# Patient Record
Sex: Male | Born: 1950 | Race: Black or African American | Hispanic: No | Smoking: Never smoker
Health system: Southern US, Community
[De-identification: ages and names within clinical notes are randomized; demographics above are authoritative.]

## PROBLEM LIST (undated history)

## (undated) DIAGNOSIS — E119 Type 2 diabetes mellitus without complications: Secondary | ICD-10-CM

## (undated) DIAGNOSIS — Z8719 Personal history of other diseases of the digestive system: Secondary | ICD-10-CM

## (undated) DIAGNOSIS — Z862 Personal history of diseases of the blood and blood-forming organs and certain disorders involving the immune mechanism: Secondary | ICD-10-CM

## (undated) DIAGNOSIS — Z860100 Personal history of colon polyps, unspecified: Secondary | ICD-10-CM

## (undated) DIAGNOSIS — F32A Depression, unspecified: Secondary | ICD-10-CM

## (undated) DIAGNOSIS — M549 Dorsalgia, unspecified: Secondary | ICD-10-CM

## (undated) DIAGNOSIS — R0789 Other chest pain: Secondary | ICD-10-CM

## (undated) DIAGNOSIS — F329 Major depressive disorder, single episode, unspecified: Secondary | ICD-10-CM

## (undated) DIAGNOSIS — I77819 Aortic ectasia, unspecified site: Secondary | ICD-10-CM

## (undated) DIAGNOSIS — M542 Cervicalgia: Secondary | ICD-10-CM

## (undated) DIAGNOSIS — G4723 Circadian rhythm sleep disorder, irregular sleep wake type: Secondary | ICD-10-CM

## (undated) DIAGNOSIS — I10 Essential (primary) hypertension: Secondary | ICD-10-CM

## (undated) DIAGNOSIS — C61 Malignant neoplasm of prostate: Secondary | ICD-10-CM

## (undated) DIAGNOSIS — K649 Unspecified hemorrhoids: Secondary | ICD-10-CM

## (undated) DIAGNOSIS — I77810 Thoracic aortic ectasia: Secondary | ICD-10-CM

## (undated) DIAGNOSIS — E785 Hyperlipidemia, unspecified: Secondary | ICD-10-CM

## (undated) DIAGNOSIS — F419 Anxiety disorder, unspecified: Secondary | ICD-10-CM

## (undated) DIAGNOSIS — G8929 Other chronic pain: Secondary | ICD-10-CM

## (undated) DIAGNOSIS — M5030 Other cervical disc degeneration, unspecified cervical region: Secondary | ICD-10-CM

## (undated) DIAGNOSIS — Z8673 Personal history of transient ischemic attack (TIA), and cerebral infarction without residual deficits: Secondary | ICD-10-CM

## (undated) DIAGNOSIS — K439 Ventral hernia without obstruction or gangrene: Secondary | ICD-10-CM

## (undated) DIAGNOSIS — G4733 Obstructive sleep apnea (adult) (pediatric): Secondary | ICD-10-CM

## (undated) DIAGNOSIS — M961 Postlaminectomy syndrome, not elsewhere classified: Secondary | ICD-10-CM

## (undated) DIAGNOSIS — F1411 Cocaine abuse, in remission: Secondary | ICD-10-CM

## (undated) DIAGNOSIS — T4145XA Adverse effect of unspecified anesthetic, initial encounter: Secondary | ICD-10-CM

## (undated) DIAGNOSIS — K222 Esophageal obstruction: Secondary | ICD-10-CM

## (undated) DIAGNOSIS — K219 Gastro-esophageal reflux disease without esophagitis: Secondary | ICD-10-CM

## (undated) DIAGNOSIS — K259 Gastric ulcer, unspecified as acute or chronic, without hemorrhage or perforation: Secondary | ICD-10-CM

## (undated) DIAGNOSIS — Z8711 Personal history of peptic ulcer disease: Secondary | ICD-10-CM

## (undated) DIAGNOSIS — G459 Transient cerebral ischemic attack, unspecified: Secondary | ICD-10-CM

## (undated) DIAGNOSIS — N3289 Other specified disorders of bladder: Secondary | ICD-10-CM

## (undated) DIAGNOSIS — K5790 Diverticulosis of intestine, part unspecified, without perforation or abscess without bleeding: Secondary | ICD-10-CM

## (undated) DIAGNOSIS — Z87438 Personal history of other diseases of male genital organs: Secondary | ICD-10-CM

## (undated) DIAGNOSIS — I700 Atherosclerosis of aorta: Secondary | ICD-10-CM

## (undated) DIAGNOSIS — Z87898 Personal history of other specified conditions: Secondary | ICD-10-CM

## (undated) DIAGNOSIS — R310 Gross hematuria: Secondary | ICD-10-CM

## (undated) HISTORY — PX: CARPAL TUNNEL RELEASE: SHX101

## (undated) HISTORY — DX: Anxiety disorder, unspecified: F41.9

## (undated) HISTORY — DX: Gastro-esophageal reflux disease without esophagitis: K21.9

## (undated) HISTORY — PX: DIRECT LARYNGOSCOPY: SHX5326

## (undated) HISTORY — DX: Obstructive sleep apnea (adult) (pediatric): G47.33

## (undated) HISTORY — DX: Esophageal obstruction: K22.2

## (undated) HISTORY — DX: Transient cerebral ischemic attack, unspecified: G45.9

## (undated) HISTORY — DX: Other specified disorders of bladder: N32.89

## (undated) HISTORY — DX: Personal history of other specified conditions: Z87.898

## (undated) HISTORY — PX: CARDIAC CATHETERIZATION: SHX172

## (undated) HISTORY — DX: Type 2 diabetes mellitus without complications: E11.9

## (undated) HISTORY — PX: TONSILLECTOMY: SUR1361

## (undated) HISTORY — PX: HEMATOMA EVACUATION: SHX5118

## (undated) HISTORY — DX: Aortic ectasia, unspecified site: I77.819

## (undated) HISTORY — PX: INGUINAL HERNIA REPAIR: SUR1180

## (undated) HISTORY — DX: Depression, unspecified: F32.A

## (undated) HISTORY — DX: Postlaminectomy syndrome, not elsewhere classified: M96.1

## (undated) HISTORY — DX: Other chronic pain: G89.29

## (undated) HISTORY — DX: Major depressive disorder, single episode, unspecified: F32.9

## (undated) HISTORY — DX: Essential (primary) hypertension: I10

## (undated) HISTORY — PX: CATARACT EXTRACTION: SUR2

## (undated) HISTORY — DX: Malignant neoplasm of prostate: C61

## (undated) HISTORY — PX: ESOPHAGOGASTRODUODENOSCOPY: SHX1529

## (undated) HISTORY — PX: TRIGGER FINGER RELEASE: SHX641

## (undated) HISTORY — DX: Cervicalgia: M54.2

## (undated) HISTORY — PX: LAPAROSCOPIC INGUINAL HERNIA REPAIR: SUR788

## (undated) HISTORY — PX: ANTERIOR CERVICAL DECOMP/DISCECTOMY FUSION: SHX1161

## (undated) HISTORY — DX: Hyperlipidemia, unspecified: E78.5

## (undated) HISTORY — DX: Thoracic aortic ectasia: I77.810

## (undated) HISTORY — DX: Personal history of other diseases of male genital organs: Z87.438

## (undated) HISTORY — DX: Diverticulosis of intestine, part unspecified, without perforation or abscess without bleeding: K57.90

## (undated) HISTORY — DX: Dorsalgia, unspecified: M54.9

## (undated) HISTORY — DX: Gross hematuria: R31.0

## (undated) HISTORY — DX: Circadian rhythm sleep disorder, irregular sleep wake type: G47.23

## (undated) HISTORY — DX: Ventral hernia without obstruction or gangrene: K43.9

---
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
## 1998-05-11 ENCOUNTER — Encounter: Payer: Self-pay | Admitting: Emergency Medicine

## 1998-05-11 ENCOUNTER — Emergency Department (HOSPITAL_COMMUNITY): Admission: EM | Admit: 1998-05-11 | Discharge: 1998-05-11 | Payer: Self-pay | Admitting: Emergency Medicine

## 1999-04-13 ENCOUNTER — Encounter: Payer: Self-pay | Admitting: Urology

## 1999-04-13 ENCOUNTER — Encounter: Admission: RE | Admit: 1999-04-13 | Discharge: 1999-04-13 | Payer: Self-pay | Admitting: Oncology

## 1999-05-11 ENCOUNTER — Encounter: Payer: Self-pay | Admitting: Urology

## 1999-05-12 ENCOUNTER — Ambulatory Visit (HOSPITAL_COMMUNITY): Admission: RE | Admit: 1999-05-12 | Discharge: 1999-05-12 | Payer: Self-pay | Admitting: Urology

## 1999-05-12 ENCOUNTER — Encounter (INDEPENDENT_AMBULATORY_CARE_PROVIDER_SITE_OTHER): Payer: Self-pay | Admitting: Specialist

## 1999-05-22 ENCOUNTER — Encounter: Payer: Self-pay | Admitting: Urology

## 1999-05-22 ENCOUNTER — Encounter: Admission: RE | Admit: 1999-05-22 | Discharge: 1999-05-22 | Payer: Self-pay | Admitting: Urology

## 2000-06-18 ENCOUNTER — Encounter: Admission: RE | Admit: 2000-06-18 | Discharge: 2000-06-18 | Payer: Self-pay | Admitting: Urology

## 2000-06-18 ENCOUNTER — Encounter: Payer: Self-pay | Admitting: Urology

## 2001-08-20 ENCOUNTER — Ambulatory Visit (HOSPITAL_COMMUNITY): Admission: RE | Admit: 2001-08-20 | Discharge: 2001-08-20 | Payer: Self-pay | Admitting: Internal Medicine

## 2001-08-20 ENCOUNTER — Encounter: Payer: Self-pay | Admitting: Internal Medicine

## 2002-04-23 DIAGNOSIS — F1491 Cocaine use, unspecified, in remission: Secondary | ICD-10-CM

## 2002-04-23 HISTORY — DX: Cocaine use, unspecified, in remission: F14.91

## 2002-08-22 ENCOUNTER — Inpatient Hospital Stay (HOSPITAL_COMMUNITY): Admission: EM | Admit: 2002-08-22 | Discharge: 2002-08-25 | Payer: Self-pay | Admitting: Emergency Medicine

## 2002-08-22 ENCOUNTER — Encounter: Payer: Self-pay | Admitting: Emergency Medicine

## 2002-09-02 ENCOUNTER — Encounter: Payer: Self-pay | Admitting: Emergency Medicine

## 2002-09-02 ENCOUNTER — Emergency Department (HOSPITAL_COMMUNITY): Admission: EM | Admit: 2002-09-02 | Discharge: 2002-09-02 | Payer: Self-pay

## 2002-09-17 ENCOUNTER — Ambulatory Visit (HOSPITAL_COMMUNITY): Admission: RE | Admit: 2002-09-17 | Discharge: 2002-09-17 | Payer: Self-pay | Admitting: Internal Medicine

## 2002-09-17 ENCOUNTER — Encounter: Payer: Self-pay | Admitting: Internal Medicine

## 2002-11-20 ENCOUNTER — Encounter: Payer: Self-pay | Admitting: Emergency Medicine

## 2002-11-20 ENCOUNTER — Emergency Department (HOSPITAL_COMMUNITY): Admission: EM | Admit: 2002-11-20 | Discharge: 2002-11-20 | Payer: Self-pay | Admitting: Emergency Medicine

## 2003-11-03 ENCOUNTER — Emergency Department (HOSPITAL_COMMUNITY): Admission: EM | Admit: 2003-11-03 | Discharge: 2003-11-03 | Payer: Self-pay | Admitting: *Deleted

## 2003-12-16 ENCOUNTER — Emergency Department (HOSPITAL_COMMUNITY): Admission: EM | Admit: 2003-12-16 | Discharge: 2003-12-16 | Payer: Self-pay | Admitting: Emergency Medicine

## 2004-08-16 ENCOUNTER — Ambulatory Visit: Payer: Self-pay | Admitting: Family Medicine

## 2005-07-24 ENCOUNTER — Ambulatory Visit: Payer: Self-pay | Admitting: Family Medicine

## 2005-07-25 ENCOUNTER — Ambulatory Visit: Payer: Self-pay | Admitting: Gastroenterology

## 2005-08-20 ENCOUNTER — Ambulatory Visit: Payer: Self-pay | Admitting: Family Medicine

## 2005-08-21 ENCOUNTER — Encounter: Payer: Self-pay | Admitting: Family Medicine

## 2005-08-21 DIAGNOSIS — E7800 Pure hypercholesterolemia, unspecified: Secondary | ICD-10-CM

## 2005-08-21 LAB — CONVERTED CEMR LAB: Hgb A1c MFr Bld: 8 %

## 2005-08-23 ENCOUNTER — Ambulatory Visit: Payer: Self-pay | Admitting: Family Medicine

## 2005-09-24 ENCOUNTER — Ambulatory Visit: Payer: Self-pay | Admitting: Family Medicine

## 2005-09-25 ENCOUNTER — Ambulatory Visit: Payer: Self-pay | Admitting: Gastroenterology

## 2005-11-21 ENCOUNTER — Encounter: Payer: Self-pay | Admitting: Family Medicine

## 2005-11-21 LAB — CONVERTED CEMR LAB: Hgb A1c MFr Bld: 6.1 %

## 2005-12-12 ENCOUNTER — Ambulatory Visit (HOSPITAL_BASED_OUTPATIENT_CLINIC_OR_DEPARTMENT_OTHER): Admission: RE | Admit: 2005-12-12 | Discharge: 2005-12-12 | Payer: Self-pay | Admitting: *Deleted

## 2005-12-20 ENCOUNTER — Ambulatory Visit: Payer: Self-pay | Admitting: Family Medicine

## 2005-12-25 ENCOUNTER — Ambulatory Visit: Payer: Self-pay | Admitting: Family Medicine

## 2006-03-27 ENCOUNTER — Ambulatory Visit: Payer: Self-pay | Admitting: Family Medicine

## 2006-04-09 ENCOUNTER — Ambulatory Visit: Payer: Self-pay | Admitting: Cardiology

## 2006-04-23 HISTORY — PX: CARDIAC CATHETERIZATION: SHX172

## 2006-04-25 ENCOUNTER — Ambulatory Visit: Payer: Self-pay

## 2006-06-26 ENCOUNTER — Ambulatory Visit: Payer: Self-pay | Admitting: Family Medicine

## 2006-10-01 ENCOUNTER — Ambulatory Visit: Payer: Self-pay | Admitting: Gastroenterology

## 2006-10-01 ENCOUNTER — Ambulatory Visit: Payer: Self-pay | Admitting: Family Medicine

## 2006-10-01 ENCOUNTER — Encounter: Payer: Self-pay | Admitting: Family Medicine

## 2006-10-01 DIAGNOSIS — E1165 Type 2 diabetes mellitus with hyperglycemia: Secondary | ICD-10-CM

## 2006-10-01 DIAGNOSIS — E118 Type 2 diabetes mellitus with unspecified complications: Secondary | ICD-10-CM

## 2006-10-01 DIAGNOSIS — Z9189 Other specified personal risk factors, not elsewhere classified: Secondary | ICD-10-CM

## 2006-10-01 LAB — CONVERTED CEMR LAB
Albumin: 3.6 g/dL (ref 3.5–5.2)
Alkaline Phosphatase: 63 units/L (ref 39–117)
BUN: 12 mg/dL (ref 6–23)
Basophils Absolute: 0 10*3/uL (ref 0.0–0.1)
Chloride: 101 meq/L (ref 96–112)
Creatinine, Ser: 0.8 mg/dL (ref 0.4–1.5)
Ferritin: 86.5 ng/mL (ref 22.0–322.0)
MCHC: 35.1 g/dL (ref 30.0–36.0)
Monocytes Relative: 11 % (ref 3.0–11.0)
Potassium: 3.9 meq/L (ref 3.5–5.1)
RBC: 4.44 M/uL (ref 4.22–5.81)
RDW: 13.8 % (ref 11.5–14.6)
TSH: 1.38 microintl units/mL (ref 0.35–5.50)
Total Bilirubin: 0.5 mg/dL (ref 0.3–1.2)
Transferrin: 249.2 mg/dL (ref >=212.0)

## 2006-10-02 ENCOUNTER — Ambulatory Visit: Payer: Self-pay | Admitting: Cardiology

## 2006-10-02 LAB — CONVERTED CEMR LAB
ALT: 27 units/L (ref 0–40)
Creatinine,U: 166.4 mg/dL
Hgb A1c MFr Bld: 7.3 % — ABNORMAL HIGH (ref 4.6–6.0)
Microalb Creat Ratio: 13.2 mg/g (ref 0.0–30.0)
Microalb, Ur: 2.2 mg/dL — ABNORMAL HIGH (ref 0.0–1.9)

## 2006-10-03 ENCOUNTER — Ambulatory Visit: Payer: Self-pay | Admitting: Family Medicine

## 2006-10-03 DIAGNOSIS — K439 Ventral hernia without obstruction or gangrene: Secondary | ICD-10-CM | POA: Insufficient documentation

## 2006-10-03 DIAGNOSIS — G4723 Circadian rhythm sleep disorder, irregular sleep wake type: Secondary | ICD-10-CM | POA: Insufficient documentation

## 2006-10-17 ENCOUNTER — Ambulatory Visit: Payer: Self-pay | Admitting: Gastroenterology

## 2006-11-04 ENCOUNTER — Encounter: Payer: Self-pay | Admitting: Family Medicine

## 2006-11-11 ENCOUNTER — Ambulatory Visit: Payer: Self-pay | Admitting: Family Medicine

## 2006-11-22 ENCOUNTER — Ambulatory Visit (HOSPITAL_COMMUNITY): Admission: RE | Admit: 2006-11-22 | Discharge: 2006-11-22 | Payer: Self-pay | Admitting: Surgery

## 2006-11-22 ENCOUNTER — Encounter (INDEPENDENT_AMBULATORY_CARE_PROVIDER_SITE_OTHER): Payer: Self-pay | Admitting: Surgery

## 2006-11-22 ENCOUNTER — Encounter: Payer: Self-pay | Admitting: Family Medicine

## 2006-12-31 ENCOUNTER — Ambulatory Visit: Payer: Self-pay | Admitting: Family Medicine

## 2006-12-31 DIAGNOSIS — M5410 Radiculopathy, site unspecified: Secondary | ICD-10-CM | POA: Insufficient documentation

## 2006-12-31 DIAGNOSIS — N453 Epididymo-orchitis: Secondary | ICD-10-CM | POA: Insufficient documentation

## 2007-01-13 ENCOUNTER — Telehealth: Payer: Self-pay | Admitting: Family Medicine

## 2007-02-10 ENCOUNTER — Encounter (INDEPENDENT_AMBULATORY_CARE_PROVIDER_SITE_OTHER): Payer: Self-pay | Admitting: *Deleted

## 2007-02-12 ENCOUNTER — Telehealth (INDEPENDENT_AMBULATORY_CARE_PROVIDER_SITE_OTHER): Payer: Self-pay | Admitting: *Deleted

## 2007-02-12 ENCOUNTER — Ambulatory Visit: Payer: Self-pay | Admitting: Family Medicine

## 2007-02-13 LAB — CONVERTED CEMR LAB: Hgb A1c MFr Bld: 7 % — ABNORMAL HIGH (ref 4.6–6.0)

## 2007-03-05 ENCOUNTER — Ambulatory Visit: Payer: Self-pay | Admitting: Family Medicine

## 2007-03-05 ENCOUNTER — Telehealth: Payer: Self-pay | Admitting: Family Medicine

## 2007-03-05 DIAGNOSIS — J069 Acute upper respiratory infection, unspecified: Secondary | ICD-10-CM | POA: Insufficient documentation

## 2007-03-05 DIAGNOSIS — G459 Transient cerebral ischemic attack, unspecified: Secondary | ICD-10-CM

## 2007-03-14 ENCOUNTER — Emergency Department (HOSPITAL_COMMUNITY): Admission: EM | Admit: 2007-03-14 | Discharge: 2007-03-14 | Payer: Self-pay | Admitting: Emergency Medicine

## 2007-03-14 ENCOUNTER — Encounter: Payer: Self-pay | Admitting: Family Medicine

## 2007-03-14 ENCOUNTER — Telehealth (INDEPENDENT_AMBULATORY_CARE_PROVIDER_SITE_OTHER): Payer: Self-pay | Admitting: Internal Medicine

## 2007-03-14 ENCOUNTER — Telehealth: Payer: Self-pay | Admitting: Family Medicine

## 2007-03-17 ENCOUNTER — Ambulatory Visit: Payer: Self-pay

## 2007-03-17 ENCOUNTER — Encounter: Payer: Self-pay | Admitting: Family Medicine

## 2007-03-19 ENCOUNTER — Ambulatory Visit: Payer: Self-pay | Admitting: Family Medicine

## 2007-03-19 DIAGNOSIS — F341 Dysthymic disorder: Secondary | ICD-10-CM | POA: Insufficient documentation

## 2007-03-21 ENCOUNTER — Ambulatory Visit: Payer: Self-pay | Admitting: Cardiovascular Disease

## 2007-04-03 ENCOUNTER — Ambulatory Visit: Payer: Self-pay | Admitting: Family Medicine

## 2007-04-21 ENCOUNTER — Ambulatory Visit: Payer: Self-pay | Admitting: Family Medicine

## 2007-04-21 DIAGNOSIS — M62838 Other muscle spasm: Secondary | ICD-10-CM | POA: Insufficient documentation

## 2007-05-27 ENCOUNTER — Telehealth: Payer: Self-pay | Admitting: Family Medicine

## 2007-05-27 ENCOUNTER — Ambulatory Visit: Payer: Self-pay | Admitting: Family Medicine

## 2007-05-28 ENCOUNTER — Ambulatory Visit: Payer: Self-pay | Admitting: Family Medicine

## 2007-05-30 ENCOUNTER — Telehealth (INDEPENDENT_AMBULATORY_CARE_PROVIDER_SITE_OTHER): Payer: Self-pay | Admitting: *Deleted

## 2007-06-02 ENCOUNTER — Telehealth: Payer: Self-pay | Admitting: Family Medicine

## 2007-06-19 ENCOUNTER — Ambulatory Visit: Payer: Self-pay | Admitting: Family Medicine

## 2007-07-01 ENCOUNTER — Ambulatory Visit: Payer: Self-pay | Admitting: Cardiovascular Disease

## 2007-07-08 ENCOUNTER — Telehealth: Payer: Self-pay | Admitting: Family Medicine

## 2007-07-15 ENCOUNTER — Ambulatory Visit: Payer: Self-pay | Admitting: Internal Medicine

## 2007-07-17 ENCOUNTER — Encounter: Payer: Self-pay | Admitting: Family Medicine

## 2007-07-17 ENCOUNTER — Ambulatory Visit: Payer: Self-pay | Admitting: Internal Medicine

## 2007-07-17 HISTORY — PX: ESOPHAGOGASTRODUODENOSCOPY: SHX1529

## 2007-07-22 ENCOUNTER — Telehealth: Payer: Self-pay | Admitting: Family Medicine

## 2007-07-25 ENCOUNTER — Ambulatory Visit: Payer: Self-pay | Admitting: Family Medicine

## 2007-07-25 DIAGNOSIS — M79609 Pain in unspecified limb: Secondary | ICD-10-CM

## 2007-08-14 ENCOUNTER — Telehealth: Payer: Self-pay | Admitting: Family Medicine

## 2007-08-15 ENCOUNTER — Ambulatory Visit: Payer: Self-pay | Admitting: Family Medicine

## 2007-08-21 ENCOUNTER — Ambulatory Visit: Payer: Self-pay | Admitting: Family Medicine

## 2007-09-22 ENCOUNTER — Ambulatory Visit: Payer: Self-pay | Admitting: Family Medicine

## 2007-09-22 DIAGNOSIS — R0602 Shortness of breath: Secondary | ICD-10-CM

## 2007-10-09 ENCOUNTER — Telehealth: Payer: Self-pay | Admitting: Family Medicine

## 2007-11-02 ENCOUNTER — Emergency Department (HOSPITAL_COMMUNITY): Admission: EM | Admit: 2007-11-02 | Discharge: 2007-11-02 | Payer: Self-pay | Admitting: Emergency Medicine

## 2007-12-18 ENCOUNTER — Ambulatory Visit: Payer: Self-pay | Admitting: Cardiovascular Disease

## 2007-12-24 ENCOUNTER — Encounter (INDEPENDENT_AMBULATORY_CARE_PROVIDER_SITE_OTHER): Payer: Self-pay | Admitting: *Deleted

## 2007-12-26 ENCOUNTER — Ambulatory Visit: Payer: Self-pay | Admitting: Cardiovascular Disease

## 2007-12-26 LAB — CONVERTED CEMR LAB
Basophils Absolute: 0.1 10*3/uL (ref 0.0–0.1)
Calcium: 8.8 mg/dL (ref 8.4–10.5)
Eosinophils Absolute: 0.3 10*3/uL (ref 0.0–0.7)
GFR calc Af Amer: 112 mL/min
GFR calc non Af Amer: 92 mL/min
HCT: 40.3 % (ref 39.0–52.0)
INR: 1 (ref 0.8–1.0)
MCHC: 34.3 g/dL (ref 30.0–36.0)
MCV: 93.1 fL (ref 78.0–100.0)
Monocytes Absolute: 0.7 10*3/uL (ref 0.1–1.0)
Monocytes Relative: 10.4 % (ref 3.0–12.0)
Platelets: 281 10*3/uL (ref 150–400)
Potassium: 3.7 meq/L (ref 3.5–5.1)
RDW: 13.8 % (ref 11.5–14.6)
Sodium: 141 meq/L (ref 135–145)
aPTT: 33.2 s — ABNORMAL HIGH (ref 21.7–29.8)

## 2007-12-31 ENCOUNTER — Inpatient Hospital Stay (HOSPITAL_BASED_OUTPATIENT_CLINIC_OR_DEPARTMENT_OTHER): Admission: RE | Admit: 2007-12-31 | Discharge: 2007-12-31 | Payer: Self-pay | Admitting: Cardiovascular Disease

## 2007-12-31 ENCOUNTER — Ambulatory Visit: Payer: Self-pay | Admitting: Cardiovascular Disease

## 2008-01-01 ENCOUNTER — Ambulatory Visit: Payer: Self-pay | Admitting: Family Medicine

## 2008-01-01 DIAGNOSIS — I10 Essential (primary) hypertension: Secondary | ICD-10-CM

## 2008-01-01 HISTORY — DX: Essential (primary) hypertension: I10

## 2008-01-05 ENCOUNTER — Ambulatory Visit: Payer: Self-pay | Admitting: Family Medicine

## 2008-01-07 ENCOUNTER — Ambulatory Visit: Payer: Self-pay | Admitting: Cardiovascular Disease

## 2008-01-13 ENCOUNTER — Telehealth: Payer: Self-pay | Admitting: Family Medicine

## 2008-01-22 ENCOUNTER — Encounter: Payer: Self-pay | Admitting: Family Medicine

## 2008-02-19 ENCOUNTER — Ambulatory Visit: Payer: Self-pay | Admitting: Family Medicine

## 2008-04-19 ENCOUNTER — Telehealth: Payer: Self-pay | Admitting: Family Medicine

## 2008-04-20 ENCOUNTER — Ambulatory Visit: Payer: Self-pay | Admitting: Cardiovascular Disease

## 2008-04-20 LAB — CONVERTED CEMR LAB
Calcium: 9.1 mg/dL (ref 8.4–10.5)
GFR calc Af Amer: 88 mL/min
GFR calc non Af Amer: 73 mL/min
Potassium: 3.8 meq/L (ref 3.5–5.1)
Sodium: 139 meq/L (ref 135–145)

## 2008-04-22 ENCOUNTER — Ambulatory Visit: Payer: Self-pay | Admitting: Internal Medicine

## 2008-04-23 DIAGNOSIS — G4733 Obstructive sleep apnea (adult) (pediatric): Secondary | ICD-10-CM

## 2008-04-23 HISTORY — DX: Obstructive sleep apnea (adult) (pediatric): G47.33

## 2008-04-25 ENCOUNTER — Encounter: Payer: Self-pay | Admitting: Cardiovascular Disease

## 2008-04-25 ENCOUNTER — Ambulatory Visit (HOSPITAL_COMMUNITY): Admission: RE | Admit: 2008-04-25 | Discharge: 2008-04-25 | Payer: Self-pay | Admitting: Cardiovascular Disease

## 2008-04-26 ENCOUNTER — Ambulatory Visit: Payer: Self-pay | Admitting: Family Medicine

## 2008-04-26 DIAGNOSIS — R079 Chest pain, unspecified: Secondary | ICD-10-CM | POA: Insufficient documentation

## 2008-04-26 DIAGNOSIS — M6520 Calcific tendinitis, unspecified site: Secondary | ICD-10-CM

## 2008-05-04 ENCOUNTER — Ambulatory Visit: Payer: Self-pay | Admitting: Gastroenterology

## 2008-05-04 ENCOUNTER — Telehealth: Payer: Self-pay | Admitting: Family Medicine

## 2008-05-06 ENCOUNTER — Ambulatory Visit: Payer: Self-pay | Admitting: Emergency Medicine

## 2008-05-07 ENCOUNTER — Encounter: Payer: Self-pay | Admitting: Emergency Medicine

## 2008-05-07 ENCOUNTER — Ambulatory Visit (HOSPITAL_BASED_OUTPATIENT_CLINIC_OR_DEPARTMENT_OTHER): Admission: RE | Admit: 2008-05-07 | Discharge: 2008-05-07 | Payer: Self-pay | Admitting: Emergency Medicine

## 2008-05-08 ENCOUNTER — Ambulatory Visit: Payer: Self-pay | Admitting: Pulmonary Disease

## 2008-05-12 ENCOUNTER — Ambulatory Visit: Payer: Self-pay | Admitting: Family Medicine

## 2008-06-04 ENCOUNTER — Telehealth: Payer: Self-pay | Admitting: Family Medicine

## 2008-06-09 ENCOUNTER — Encounter: Payer: Self-pay | Admitting: Family Medicine

## 2008-06-23 ENCOUNTER — Telehealth: Payer: Self-pay | Admitting: Family Medicine

## 2008-06-24 ENCOUNTER — Inpatient Hospital Stay (HOSPITAL_COMMUNITY): Admission: RE | Admit: 2008-06-24 | Discharge: 2008-06-25 | Payer: Self-pay | Admitting: Orthopedic Surgery

## 2008-07-21 ENCOUNTER — Ambulatory Visit: Payer: Self-pay | Admitting: Family Medicine

## 2008-08-04 ENCOUNTER — Ambulatory Visit: Payer: Self-pay | Admitting: Family Medicine

## 2008-08-09 ENCOUNTER — Telehealth: Payer: Self-pay | Admitting: Family Medicine

## 2008-08-19 ENCOUNTER — Ambulatory Visit: Payer: Self-pay | Admitting: Family Medicine

## 2008-09-24 ENCOUNTER — Encounter: Admission: RE | Admit: 2008-09-24 | Discharge: 2008-09-24 | Payer: Self-pay | Admitting: Orthopedic Surgery

## 2008-09-26 ENCOUNTER — Telehealth: Payer: Self-pay | Admitting: Emergency Medicine

## 2008-09-29 DIAGNOSIS — K219 Gastro-esophageal reflux disease without esophagitis: Secondary | ICD-10-CM

## 2008-09-29 DIAGNOSIS — J329 Chronic sinusitis, unspecified: Secondary | ICD-10-CM | POA: Insufficient documentation

## 2008-09-30 ENCOUNTER — Ambulatory Visit: Payer: Self-pay | Admitting: Cardiovascular Disease

## 2008-09-30 ENCOUNTER — Ambulatory Visit: Payer: Self-pay | Admitting: Family Medicine

## 2008-10-18 ENCOUNTER — Ambulatory Visit: Payer: Self-pay | Admitting: Family Medicine

## 2008-10-18 DIAGNOSIS — M940 Chondrocostal junction syndrome [Tietze]: Secondary | ICD-10-CM

## 2008-10-19 ENCOUNTER — Encounter: Payer: Self-pay | Admitting: Family Medicine

## 2008-10-28 ENCOUNTER — Inpatient Hospital Stay (HOSPITAL_COMMUNITY): Admission: RE | Admit: 2008-10-28 | Discharge: 2008-11-05 | Payer: Self-pay | Admitting: Orthopedic Surgery

## 2008-10-28 ENCOUNTER — Encounter: Payer: Self-pay | Admitting: Cardiovascular Disease

## 2008-10-28 ENCOUNTER — Encounter: Payer: Self-pay | Admitting: Family Medicine

## 2008-10-28 ENCOUNTER — Ambulatory Visit: Payer: Self-pay | Admitting: Internal Medicine

## 2008-10-28 HISTORY — PX: ANTERIOR CERVICAL DISCECTOMY: SHX1160

## 2008-10-29 HISTORY — PX: HEMATOMA EVACUATION: SHX5118

## 2008-11-01 ENCOUNTER — Encounter: Payer: Self-pay | Admitting: Family Medicine

## 2008-11-02 ENCOUNTER — Encounter: Payer: Self-pay | Admitting: Family Medicine

## 2008-11-02 HISTORY — PX: DIRECT LARYNGOSCOPY: SHX5326

## 2008-11-05 ENCOUNTER — Encounter: Payer: Self-pay | Admitting: Family Medicine

## 2008-11-16 ENCOUNTER — Ambulatory Visit: Payer: Self-pay | Admitting: Family Medicine

## 2008-11-30 ENCOUNTER — Ambulatory Visit: Payer: Self-pay | Admitting: Family Medicine

## 2008-12-08 ENCOUNTER — Encounter: Admission: RE | Admit: 2008-12-08 | Discharge: 2008-12-28 | Payer: Self-pay | Admitting: Orthopedic Surgery

## 2008-12-30 ENCOUNTER — Telehealth: Payer: Self-pay | Admitting: Family Medicine

## 2009-01-27 ENCOUNTER — Ambulatory Visit: Payer: Self-pay | Admitting: Family Medicine

## 2009-05-17 ENCOUNTER — Telehealth: Payer: Self-pay | Admitting: Family Medicine

## 2009-06-13 ENCOUNTER — Emergency Department (HOSPITAL_COMMUNITY): Admission: EM | Admit: 2009-06-13 | Discharge: 2009-06-13 | Payer: Self-pay | Admitting: Emergency Medicine

## 2009-06-15 ENCOUNTER — Ambulatory Visit: Payer: Self-pay | Admitting: Family Medicine

## 2009-06-17 ENCOUNTER — Ambulatory Visit: Payer: Self-pay | Admitting: Gastroenterology

## 2009-06-23 LAB — CONVERTED CEMR LAB
ALT: 18 units/L (ref 0–53)
AST: 16 units/L (ref 0–37)
Amylase: 124 units/L (ref 27–131)
Basophils Absolute: 0.1 10*3/uL (ref 0.0–0.1)
Basophils Relative: 1.2 % (ref 0.0–3.0)
Bilirubin, Direct: 0.1 mg/dL (ref 0.0–0.3)
CO2: 28 meq/L (ref 19–32)
Chloride: 107 meq/L (ref 96–112)
Creatinine, Ser: 0.9 mg/dL (ref 0.4–1.5)
Eosinophils Absolute: 0.2 10*3/uL (ref 0.0–0.7)
Hemoglobin: 13.1 g/dL (ref 13.0–17.0)
Iron: 60 ug/dL (ref 42–165)
Lipase: 31 units/L (ref 11.0–59.0)
Lymphocytes Relative: 27.5 % (ref 12.0–46.0)
Monocytes Relative: 9.7 % (ref 3.0–12.0)
Neutro Abs: 2.7 10*3/uL (ref 1.4–7.7)
Neutrophils Relative %: 58 % (ref 43.0–77.0)
Potassium: 4.1 meq/L (ref 3.5–5.1)
RBC: 4.36 M/uL (ref 4.22–5.81)
Saturation Ratios: 17.5 % — ABNORMAL LOW (ref 20.0–50.0)
Sed Rate: 24 mm/hr — ABNORMAL HIGH (ref 0–22)
Total Bilirubin: 0.3 mg/dL (ref 0.3–1.2)
Total Protein: 7.5 g/dL (ref 6.0–8.3)
Transferrin: 245.5 mg/dL (ref 212.0–360.0)
Vitamin B-12: 436 pg/mL (ref 211–911)

## 2009-06-29 ENCOUNTER — Ambulatory Visit: Payer: Self-pay | Admitting: Cardiovascular Disease

## 2009-07-01 ENCOUNTER — Ambulatory Visit: Payer: Self-pay | Admitting: Gastroenterology

## 2009-07-01 ENCOUNTER — Encounter (INDEPENDENT_AMBULATORY_CARE_PROVIDER_SITE_OTHER): Payer: Self-pay | Admitting: *Deleted

## 2009-07-01 DIAGNOSIS — D509 Iron deficiency anemia, unspecified: Secondary | ICD-10-CM

## 2009-07-25 ENCOUNTER — Ambulatory Visit: Payer: Self-pay | Admitting: Gastroenterology

## 2009-08-31 ENCOUNTER — Ambulatory Visit: Payer: Self-pay | Admitting: Family Medicine

## 2009-09-30 ENCOUNTER — Encounter: Payer: Self-pay | Admitting: Family Medicine

## 2009-10-28 ENCOUNTER — Ambulatory Visit: Payer: Self-pay | Admitting: Gastroenterology

## 2009-10-28 ENCOUNTER — Encounter (INDEPENDENT_AMBULATORY_CARE_PROVIDER_SITE_OTHER): Payer: Self-pay | Admitting: *Deleted

## 2009-10-28 DIAGNOSIS — R1013 Epigastric pain: Secondary | ICD-10-CM

## 2009-10-28 LAB — CONVERTED CEMR LAB
Albumin: 4 g/dL (ref 3.5–5.2)
Alkaline Phosphatase: 56 units/L (ref 39–117)
Basophils Absolute: 0.1 10*3/uL (ref 0.0–0.1)
Calcium: 9.3 mg/dL (ref 8.4–10.5)
Eosinophils Absolute: 0.1 10*3/uL (ref 0.0–0.7)
Eosinophils Relative: 1.6 % (ref 0.0–5.0)
Ferritin: 49.8 ng/mL (ref 22.0–322.0)
Folate: >20 ng/mL
GFR calc non Af Amer: 109.41 mL/min (ref >60)
Glucose, Bld: 146 mg/dL — ABNORMAL HIGH (ref 70–99)
H Pylori IgG: NEGATIVE
HCT: 42.3 % (ref 39.0–52.0)
Iron: 89 ug/dL (ref 42–165)
Lymphs Abs: 1.6 10*3/uL (ref 0.7–4.0)
MCHC: 33.7 g/dL (ref 30.0–36.0)
MCV: 91.9 fL (ref 78.0–100.0)
Monocytes Absolute: 0.7 10*3/uL (ref 0.1–1.0)
Neutrophils Relative %: 62.9 % (ref 43.0–77.0)
Platelets: 275 10*3/uL (ref 150.0–400.0)
Potassium: 4.6 meq/L (ref 3.5–5.1)
RDW: 15.5 % — ABNORMAL HIGH (ref 11.5–14.6)
Saturation Ratios: 21.8 % (ref 20.0–50.0)
Sodium: 142 meq/L (ref 135–145)
Total Bilirubin: 0.6 mg/dL (ref 0.3–1.2)

## 2009-11-01 ENCOUNTER — Ambulatory Visit (HOSPITAL_COMMUNITY): Admission: RE | Admit: 2009-11-01 | Discharge: 2009-11-01 | Payer: Self-pay | Admitting: Internal Medicine

## 2009-11-09 ENCOUNTER — Telehealth (INDEPENDENT_AMBULATORY_CARE_PROVIDER_SITE_OTHER): Payer: Self-pay | Admitting: *Deleted

## 2009-11-09 ENCOUNTER — Ambulatory Visit: Payer: Self-pay | Admitting: Gastroenterology

## 2009-11-09 LAB — CONVERTED CEMR LAB: UREASE: NEGATIVE

## 2009-11-11 ENCOUNTER — Encounter: Payer: Self-pay | Admitting: Gastroenterology

## 2009-11-16 ENCOUNTER — Telehealth: Payer: Self-pay | Admitting: Gastroenterology

## 2009-11-18 ENCOUNTER — Encounter: Payer: Self-pay | Admitting: Gastroenterology

## 2009-11-29 ENCOUNTER — Encounter (INDEPENDENT_AMBULATORY_CARE_PROVIDER_SITE_OTHER): Payer: Self-pay | Admitting: *Deleted

## 2009-11-30 ENCOUNTER — Encounter (INDEPENDENT_AMBULATORY_CARE_PROVIDER_SITE_OTHER): Payer: Self-pay | Admitting: *Deleted

## 2009-12-02 ENCOUNTER — Ambulatory Visit: Payer: Self-pay | Admitting: Family Medicine

## 2009-12-03 LAB — CONVERTED CEMR LAB
BUN: 12 mg/dL (ref 6–23)
CO2: 30 meq/L (ref 19–32)
Chloride: 103 meq/L (ref 96–112)
Hgb A1c MFr Bld: 8.5 % — ABNORMAL HIGH (ref 4.6–6.5)
Potassium: 3.8 meq/L (ref 3.5–5.1)

## 2009-12-05 ENCOUNTER — Ambulatory Visit: Payer: Self-pay | Admitting: Family Medicine

## 2009-12-12 ENCOUNTER — Telehealth: Payer: Self-pay | Admitting: Gastroenterology

## 2010-01-12 ENCOUNTER — Ambulatory Visit: Payer: Self-pay | Admitting: Family Medicine

## 2010-01-12 DIAGNOSIS — D179 Benign lipomatous neoplasm, unspecified: Secondary | ICD-10-CM | POA: Insufficient documentation

## 2010-01-13 ENCOUNTER — Telehealth: Payer: Self-pay | Admitting: Gastroenterology

## 2010-01-25 ENCOUNTER — Ambulatory Visit: Payer: Self-pay | Admitting: Family Medicine

## 2010-02-09 ENCOUNTER — Encounter: Payer: Self-pay | Admitting: Family Medicine

## 2010-02-16 ENCOUNTER — Ambulatory Visit: Payer: Self-pay | Admitting: Family Medicine

## 2010-02-17 LAB — CONVERTED CEMR LAB
Albumin: 3.7 g/dL (ref 3.5–5.2)
Alkaline Phosphatase: 55 units/L (ref 39–117)
Basophils Relative: 0.8 % (ref 0.0–3.0)
CO2: 30 meq/L (ref 19–32)
Chloride: 95 meq/L — ABNORMAL LOW (ref 96–112)
Eosinophils Absolute: 0.1 10*3/uL (ref 0.0–0.7)
Glucose, Bld: 85 mg/dL (ref 70–99)
HCT: 38.4 % — ABNORMAL LOW (ref 39.0–52.0)
Hemoglobin: 13 g/dL (ref 13.0–17.0)
Lymphs Abs: 2.2 10*3/uL (ref 0.7–4.0)
MCHC: 33.9 g/dL (ref 30.0–36.0)
MCV: 94 fL (ref 78.0–100.0)
Monocytes Absolute: 0.7 10*3/uL (ref 0.1–1.0)
Neutro Abs: 4.5 10*3/uL (ref 1.4–7.7)
RBC: 4.09 M/uL — ABNORMAL LOW (ref 4.22–5.81)
Sodium: 133 meq/L — ABNORMAL LOW (ref 135–145)
Total Protein: 6.8 g/dL (ref 6.0–8.3)

## 2010-02-28 ENCOUNTER — Ambulatory Visit: Payer: Self-pay | Admitting: Family Medicine

## 2010-02-28 ENCOUNTER — Telehealth: Payer: Self-pay | Admitting: Gastroenterology

## 2010-02-28 LAB — CONVERTED CEMR LAB
Bilirubin Urine: NEGATIVE
Ketones, urine, test strip: NEGATIVE
Nitrite: NEGATIVE
Specific Gravity, Urine: 1.01
Urobilinogen, UA: 0.2
pH: 6

## 2010-03-01 ENCOUNTER — Telehealth: Payer: Self-pay | Admitting: Family Medicine

## 2010-03-02 ENCOUNTER — Ambulatory Visit: Payer: Self-pay | Admitting: Family Medicine

## 2010-03-02 LAB — CONVERTED CEMR LAB
Creatinine,U: 136.5 mg/dL
Direct LDL: 162.4 mg/dL
Hgb A1c MFr Bld: 7.7 % — ABNORMAL HIGH (ref 4.6–6.5)
Microalb Creat Ratio: 2.6 mg/g (ref 0.0–30.0)
Total CHOL/HDL Ratio: 5
Triglycerides: 105 mg/dL (ref 0.0–149.0)

## 2010-03-09 ENCOUNTER — Ambulatory Visit: Payer: Self-pay | Admitting: Family Medicine

## 2010-03-09 DIAGNOSIS — M25559 Pain in unspecified hip: Secondary | ICD-10-CM | POA: Insufficient documentation

## 2010-03-22 ENCOUNTER — Telehealth (INDEPENDENT_AMBULATORY_CARE_PROVIDER_SITE_OTHER): Payer: Self-pay | Admitting: *Deleted

## 2010-03-22 ENCOUNTER — Ambulatory Visit: Payer: Self-pay | Admitting: Cardiovascular Disease

## 2010-04-06 ENCOUNTER — Telehealth: Payer: Self-pay | Admitting: Family Medicine

## 2010-04-18 ENCOUNTER — Telehealth: Payer: Self-pay | Admitting: Gastroenterology

## 2010-05-04 ENCOUNTER — Telehealth: Payer: Self-pay | Admitting: Family Medicine

## 2010-05-04 ENCOUNTER — Ambulatory Visit
Admission: RE | Admit: 2010-05-04 | Discharge: 2010-05-04 | Payer: Self-pay | Source: Home / Self Care | Attending: Family Medicine | Admitting: Family Medicine

## 2010-05-04 DIAGNOSIS — R21 Rash and other nonspecific skin eruption: Secondary | ICD-10-CM | POA: Insufficient documentation

## 2010-05-04 LAB — HM DIABETES FOOT EXAM

## 2010-05-05 ENCOUNTER — Encounter: Payer: Self-pay | Admitting: Family Medicine

## 2010-05-14 ENCOUNTER — Encounter: Payer: Self-pay | Admitting: Cardiovascular Disease

## 2010-05-15 ENCOUNTER — Ambulatory Visit: Admission: RE | Admit: 2010-05-15 | Payer: Self-pay | Source: Home / Self Care | Admitting: Cardiovascular Disease

## 2010-05-15 ENCOUNTER — Encounter: Payer: Self-pay | Admitting: Orthopedic Surgery

## 2010-05-15 ENCOUNTER — Other Ambulatory Visit: Payer: Self-pay | Admitting: Cardiovascular Disease

## 2010-05-15 LAB — BASIC METABOLIC PANEL
Calcium: 9.2 mg/dL (ref 8.4–10.5)
Chloride: 101 mEq/L (ref 96–112)
Creatinine, Ser: 0.9 mg/dL (ref 0.4–1.5)

## 2010-05-23 NOTE — Assessment & Plan Note (Signed)
Summary: f/u--ch.   History of Present Illness Visit Type: Follow-up Visit Primary GI MD: Sheryn Bison MD FACP FAGA Primary Provider: Laurita Quint, MD Requesting Provider: n/a Chief Complaint: pt is here to follow up GERD, pt is not taking a PPI, pt states he is having to drink a lot of water to help food go down.  States food burns stomach. Pt also states that he is having lower abdominal and pelvic pain that radiates to back and sometimes makes it hard to sit History of Present Illness:   Previous dictation erased my dictation system. Basically this is a 60 year old African American male with recurrent lower bowel pain gas and bloating with numerous negative GI evaluations. His long history of somatic complaints and previous narcotic dependency. He continues to complain of generalized bowel dysfunction and generalized abdominal pain. He denies any red flag symptoms or history of rectal bleeding. Appetite is good and his weight is stable he denies food intolerances. He denies history of hepatitis or pancreatitis but was a previous abuser of ethanol. Cannot get a history today of reflux or dysphasia.   GI Review of Systems    Reports acid reflux, dysphagia with solids, and  heartburn.     Location of  Abdominal pain: lower abdomen.    Denies abdominal pain, belching, bloating, chest pain, dysphagia with liquids, loss of appetite, nausea, vomiting, vomiting blood, weight loss, and  weight gain.        Denies anal fissure, black tarry stools, change in bowel habit, constipation, diarrhea, diverticulosis, fecal incontinence, heme positive stool, hemorrhoids, irritable bowel syndrome, jaundice, light color stool, liver problems, rectal bleeding, and  rectal pain.    Current Medications (verified): 1)  Glipizide-Metformin Hcl 5-500 Mg  Tabs (Glipizide-Metformin Hcl) .Marland Kitchen.. 1  Tab By Mouth in Am and 4 Tabs in Pm... 2)  Metoprolol Succinate 50 Mg  Tb24 (Metoprolol Succinate) .Marland Kitchen.. 1 Daily By  Mouth 3)  Onetouch Ultra Test   Strp (Glucose Blood) .... Check Blood Sugar As Directed 4)  Clotrimazole 1 % Crea (Clotrimazole) .... Apply Two Times A Day (1 Large Tube - Covers Feet)  Allergies (verified): No Known Drug Allergies  Past History:  Past medical, surgical, family and social histories (including risk factors) reviewed for relevance to current acute and chronic problems.  Past Medical History: Reviewed history from 09/29/2008 and no changes required. 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 of SLEEP APNEA (ICD-780.57) SINUSITIS (ICD-473.9)   Nonobstructive coronary disease on cardiac cath on December 31, 2007.   Past Surgical History: Reviewed history from 11/17/2008 and no changes required. Childhood      Tonsillectomy.                       R Carpal tunnel release.  5/04              Cath - normal 5/1-08/25/02     [facility] Chest Pain  R/O'd 11/2003          R/O at ER 12/12/05        Trigger finger release, right long finger 04/25/06  Stress MUGA normal, EF 60% 11/22/06           Bilat Inguinal Hernia Repair, 5cm RLQAbd Lipoma Excision  (Dr Ezzard Standing) 03/17/07       Adenosine Myoview  low risk  07/17/07         EGD Esoph Stricture Dilated (Dr Marina Goodell) 10/28/08           Anterior cervical diskectomy and fusion (Dr Shon Baton) 10/29/08           Hematoma Evacuation (Dr Shon Baton) 11/02/08         Direct  Laryngosocopy, Extubation under Anesthesia (Dr Lazarus Salines)        Family History: Reviewed history from 04/26/2008 and no changes required. Father: dec  60  Accident, fell off building Mother: A  72  EVFA Shoffner, DM, HTN, high cholesterol Brother A  HTN, DM Brother A HTN,  DM Brother A DM Sister A  HTN, DM CV: (+) maternal uncle - heart failure HBP:  (+) self, mother, brothers x 2, sister DM: (+) self, mother, brothers x 3, sisters, father Prostate CA:  PGF and P. uncle  Stroke:  Negative No FH of Colon Cancer:  Social History: Reviewed history from 05/06/2008 and no changes required. 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  The patient denies allergy/sinus, anemia, anxiety-new, arthritis/joint pain, blood in urine, breast changes/lumps, confusion, cough, coughing up blood, depression-new, fainting, fatigue, fever, headaches-new, hearing problems, heart murmur, heart rhythm changes, itching, muscle pains/cramps, night sweats, nosebleeds, shortness of breath, skin rash, sleeping problems, sore throat, swelling of feet/legs, swollen lymph glands, thirst - excessive, urination - excessive, urination changes/pain, urine leakage, vision changes, and voice change.    Vital Signs:  Patient profile:   60 year old male Height:      68 inches Weight:      248 pounds BMI:     37.84 Pulse rate:   68 / minute Pulse rhythm:   regular BP sitting:   124 / 76  (left arm) Cuff size:   large  Vitals Entered By: Francee Piccolo CMA Duncan Dull) (June 17, 2009 9:29 AM)  Physical Exam  General:  Well developed, well nourished,  no acute distress.healthy appearing.   Head:  Normocephalic and atraumatic. Eyes:  PERRLA, no icterus.exam deferred to patient's ophthalmologist.   Neck:  Supple; no masses or thyromegaly. Lungs:  Clear throughout to auscultation. Heart:  Regular rate and rhythm; no murmurs, rubs,  or bruits. Abdomen:  Soft, nontender and nondistended. No masses, hepatosplenomegaly or hernias noted. Normal bowel sounds. Rectal:  Normal exam.hemocult negative.   Extremities:  No clubbing, cyanosis, edema or deformities noted. Neurologic:  Alert and  oriented x4;  grossly normal neurologically. Cervical Nodes:  No significant cervical adenopathy. Inguinal Nodes:  No significant inguinal adenopathy. Psych:  Alert and cooperative. Normal mood and affect.   Impression & Recommendations:  Problem # 1:  ABDOMINAL PAIN, GENERALIZED (ICD-789.07) Assessment Unchanged Consider idiopathic bacterial overgrowth syndrome in this patient with chronic functional IBS-type complaints with multiple previous negative examinations. I will treat him empirically with Xifaxan 550 mg twice a day for 10 days along with Align probiotic therapy. Screening labs  will be repeated. He is to see me again in 2 weeks' time for followup. Will try to obtain his old chart for further review. I am reluctant to proceed with further colonoscopy and endoscopy testing is not indicated. I had a long talk to him today about addiction, alcoholism, and have warned him that we will only give him a limited supply of tramadol 50 mg every 8 hours for pain. Apparently he did not keep his previous appointments at pain clinics as suggested. I am concerned that this may be drug seeking behavior from this patient Orders: TLB-CBC Platelet - w/Differential (85025-CBCD) TLB-BMP (Basic Metabolic Panel-BMET) (80048-METABOL) TLB-Hepatic/Liver Function Pnl (80076-HEPATIC) TLB-TSH (Thyroid Stimulating Hormone) (84443-TSH) TLB-B12, Serum-Total ONLY  (78469-G29) TLB-Ferritin (82728-FER) TLB-Folic Acid (Folate) (82746-FOL) TLB-IBC Pnl (Iron/FE;Transferrin) (83550-IBC) TLB-Amylase (82150-AMYL) TLB-Lipase (83690-LIPASE) TLB-Sedimentation Rate (ESR) (85652-ESR) TLB-IgA (Immunoglobulin A) (82784-IGA) T-Sprue Panel (Celiac Disease Aby Eval) (83516x3/[address])  Problem # 2:  CHEST PAIN (ICD-786.50) Assessment: Unchanged Negative extensive cardiac and GI workups.  Problem # 3:  ESSENTIAL HYPERTENSION (ICD-401.9) Assessment: Improved Blood Pressure Today Is Normal at 124/7.6 We have asked him to continue all of his other medications as per her primary care  Patient Instructions: 1)  Copy sent to : Dr. Laurita Quint 2)  Please continue current medications. 3)  Treatment for possible bacterial overgrowth syndrome 4)  Tramadol 50 mg every 8 hours as needed for pain with no refills at this time 5)  Labs pending  6)  The medication list was reviewed and reconciled.  All changed / newly prescribed medications were explained.  A complete medication list was provided to the patient / caregiver. Prescriptions: TRAMADOL HCL 50 MG TABS (TRAMADOL HCL) 1 by mouth q 4-6 hrs as needed pain  #40 x 1   Entered by:   Ashok Cordia RN   Authorized by:   Mardella Layman MD [facility]   Signed by:   Ashok Cordia RN on 06/17/2009   Method used:   Print then Give to Patient   RxID:   [id number] TRAMADOL HCL 50 MG TABS (TRAMADOL HCL) 1 by mouth q 4-6 hrs as needed pain  #40 x 1   Entered by:   Ashok Cordia RN   Authorized by:   Mardella Layman MD [facility]   Signed by:   Ashok Cordia RN on 06/17/2009   Method used:   Print then Give to Patient   RxID:   [id number] XIFAXAN 550 MG TABS (RIFAXIMIN) 1 by mouth two times a day x 10 days  #20 x 0   Entered by:   Ashok Cordia RN   Authorized by:   Mardella Layman MD [facility]   Signed by:   Ashok Cordia RN on 06/17/2009   Method used:   Print then Give to Patient   RxID:   [id number]

## 2010-05-23 NOTE — Assessment & Plan Note (Signed)
Summary: f/u from er visit 06-13-09/chest pain      Allergies Added: NKDA  Primary Provider:  Laurita Quint, MD  CC:  pt complains of numbness in left arm also chest pain and pain in leg. Severity of pain 9.  History of Present Illness: [name] is seen today for F/U of SSCP.  He has had a normal cath in 2008.  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  He has had two neck surgeries recently the last in June by Dr Shon Baton.  They were apparantly complicated and he still sees Dr Ethelene Hal for "pain".  Last night he had a lot of sharp pain in the left side of his chest radiating to the left arm with paresthesias.  It sounds radicular and clearly not cardiac.  He has some exertional dyspnea especially with the pain.  He has had a normal EF in the past with no smoking or chronic lung disease.  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) 3)  Costochondritis  (ICD-733.6) 4)  Chest Pain  (ICD-786.50) 5)  Chest Pain  (ICD-786.50) 6)  Gerd  (ICD-530.81) 7)  Hand Pain, Right Long Finger and Midpalm  (ICD-729.5) 8)  Calcific Tendinitis  (ICD-727.82) 9)  Hypercholesterolemia, Pure  (ICD-272.0) 10)  Essential Hypertension  (ICD-401.9) 11)  Dyspnea  (ICD-786.05) 12)  Foot Pain, Right  (ICD-729.5) 13)  Esophageal Stricture  (ICD-530.3) 14)  Muscle Spasm  (ICD-728.85) 15)  Anxiety Depression  (ICD-300.4) 16)  Tia  (ICD-435.9) 17)  Epididymitis  (ICD-604.90) 18)  Back Pain With Radiculopathy  (ICD-729.2) 19)  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 of Sleep Apnea  (ICD-780.57) 24)  Sinusitis  (ICD-473.9)  Current Medications (verified): 1)   Glipizide-Metformin Hcl 5-500 Mg  Tabs (Glipizide-Metformin Hcl) .Marland Kitchen.. 1  Tab By Mouth in Am and 4 Tabs in Pm... 2)  Metoprolol Succinate 50 Mg  Tb24 (Metoprolol Succinate) .Marland Kitchen.. 1 Daily By Mouth 3)  Onetouch Ultra Test   Strp (Glucose Blood) .... Check Blood Sugar As Directed 4)  Tramadol Hcl 50 Mg Tabs (Tramadol Hcl) .Marland Kitchen.. 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 of SLEEP APNEA (ICD-780.57) SINUSITIS (ICD-473.9)   Nonobstructive coronary disease on cardiac cath on December 31, 2007.   Past Surgical History: Last updated: 11/17/2008 Childhood      Tonsillectomy.                       R Carpal tunnel release.  5/04              Cath - normal 5/1-08/25/02     [facility] Chest Pain  R/O'd 11/2003          R/O at ER 12/12/05        Trigger finger release, right long finger 04/25/06           Stress MUGA normal, EF 60% 11/22/06           Bilat Inguinal Hernia Repair, 5cm RLQAbd Lipoma Excision  (Dr Ezzard Standing) 03/17/07       Adenosine Myoview  low risk  07/17/07         EGD Esoph Stricture Dilated (Dr Marina Goodell) 10/28/08           Anterior cervical diskectomy and fusion (Dr Shon Baton) 10/29/08           Hematoma Evacuation (Dr Shon Baton) 11/02/08         Direct Laryngosocopy, Extubation under Anesthesia  (Dr Lazarus Salines)        Family History: Last updated: 06/17/2009 Father: dec  25  Accident, fell off building Mother: A  72  EVFA Shoffner, DM, HTN, high cholesterol Brother A  HTN, DM Brother A HTN,  DM Brother A DM Sister A  HTN, DM CV: (+) maternal uncle - heart failure HBP:  (+) self, mother, brothers x 2, sister DM: (+) self, mother, brothers x 3, sisters, father Prostate CA:  PGF and P. 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   Vital Signs:  Patient profile:   60 year old male Height:      68 inches Weight:      244 pounds BMI:     37.23 Pulse rate:   57 / minute Resp:     14 per minute BP sitting:   134 / 77  (left arm)  Vitals Entered By: Kem Parkinson (June 29, 2009 9:30 AM)  Physical Exam  General:  Affect appropriate Healthy:  appears stated age HEENT: normal Neck supple with no adenopathy JVP normal no bruits no thyromegaly Lungs clear with no wheezing and good diaphragmatic motion Heart:  S1/S2 no murmur,rub, gallop or click PMI normal Abdomen: benighn, BS positve, no tenderness, no AAA no bruit.  No HSM or HJR Distal pulses intact with no bruits No edema Neuro non-focal Skin warm and dry Recent anterior cervical neck surgery bilateral   Impression & Recommendations:  Problem # 1:  CHEST PAIN  (ICD-786.50) Non-cardiac.  F/U [facility] His updated medication list for this problem includes:    Metoprolol Succinate 50 Mg Tb24 (Metoprolol succinate) .Marland Kitchen... 1 daily by mouth  Problem # 2:  HYPERCHOLESTEROLEMIA, PURE (ICD-272.0) No vascular disease diet Rx CHOL: 189 (10/01/2006)   LDL: 124 (10/01/2006)   HDL: 33.0 (10/01/2006)   TG: 159 (10/01/2006)  Problem # 3:  ESSENTIAL HYPERTENSION (ICD-401.9) Well controlled continue low sodium diet His updated medication list for this problem includes:    Metoprolol Succinate 50 Mg Tb24 (Metoprolol succinate) .Marland Kitchen... 1 daily by mouth  Problem # 4:  DYSPNEA (ICD-786.05) Funcitonal.  Normal exam and ECG His updated medication list for this problem includes:    Metoprolol Succinate 50 Mg Tb24 (Metoprolol succinate) .Marland Kitchen... 1 daily by mouth  Patient Instructions: 1)  Your physician recommends that you schedule a follow-up appointment in: ONE YEAR   Echocardiogram Report  Procedure date:  06/29/2009  Findings:      NSR 71 Norm[address] ECG

## 2010-05-23 NOTE — Assessment & Plan Note (Signed)
Summary: ROA  CYD   Vital Signs:  Patient profile:   60 year old male Weight:      240.25 pounds Temp:     98.4 degrees F oral Pulse rate:   72 / minute Pulse rhythm:   regular BP sitting:   118 / 84  (left arm) Cuff size:   large CC: follow-up visit   History of Present Illness: Pt here for followup of his diabetes. In the interim since being seen by me, he has seen Dr Jarold Motto multiple times  trying to get his GI Tract under control. He is now taking [name] and has had a colonoscopy. He had divertics and was told to be on high fiber diet. He is much more controlled at this point. He has also been seen by  Dr Eden Emms a few times and was felt to have noncardiac chest pain via workup. He has had "drops" of sugar in the afternoon after eating but now that he is on a regular routine, this has responded positively and he has not had those drops.His diary in general is very good, a few lows in the 60s, most nos in the 80-140s and a few highs in the 170-200s. He has checked himself 4 times a day for the last few weeks and nos are fairly impressive. He has convinced himself that he cannot walk because he gives out so easily. He has noticed that his sugars come down when he walks. We discussed that this is a positive thing. He will have to watch his sugars when walking to avoid lows but I highly encourage him to start doing this regularly. We discussed buiding up, walking 5 mins at a time once a day for 2 weeks, then going to 7 mins for 2 weeks, then 10 mins, etc..  He is 14 pounds lighter than last time I saw him. I again encouraged him. He is no longer working.  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) 4)  Costochondritis  (ICD-733.6) 5)  Chest Pain  (ICD-786.50) 6)  Chest Pain  (ICD-786.50) 7)  Gerd  (ICD-530.81) 8)  Hand Pain, Right Long Finger and Midpalm  (ICD-729.5) 9)   Calcific Tendinitis  (ICD-727.82) 10)  Hypercholesterolemia, Pure  (ICD-272.0) 11)  Essential Hypertension  (ICD-401.9) 12)  Dyspnea  (ICD-786.05) 13)  Foot Pain, Right  (ICD-729.5) 14)  Esophageal Stricture  (ICD-530.3) 15)  Muscle Spasm  (ICD-728.85) 16)  Anxiety Depression  (ICD-300.4) 17)  Tia  (ICD-435.9) 18)  Epididymitis  (ICD-604.90) 19)  Back Pain With Radiculopathy  (ICD-729.2) 20)  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 of Sleep Apnea  (ICD-780.57) 25)  Sinusitis  (ICD-473.9)  Medications Prior to Update: 1)  Glipizide-Metformin Hcl 5-500 Mg  Tabs (Glipizide-Metformin Hcl) .Marland Kitchen.. 1  Tab By Mouth in Am and 4 Tabs in Pm... 2)  Metoprolol Succinate 50 Mg  Tb24 (Metoprolol Succinate) .Marland Kitchen.. 1 Daily By Mouth 3)  Onetouch Ultra Test   Strp (Glucose Blood) .... Check Blood Sugar As Directed 4)  Tramadol Hcl 50 Mg Tabs (Tramadol Hcl) .Marland Kitchen.. 1 By Mouth Q 4-6 Hrs As Needed Pain 5)  Vear Clock Colon Health  Caps (Probiotic Product) .... Two Times A Day 6)  [name] 20 Gm  Pack (Lactulose) .... 20 Grams At Bedtime  Allergies: No Known Drug Allergies  Physical Exam  General:  Well-developed,well-nourished,in no acute distress; alert,appropriate and cooperative throughout examination, comfortable.  Head:  Normocephalic and atraumatic without obvious abnormalities. No apparent alopecia or balding. Eyes:  Conjunctiva clear bilaterally.  Ears:  no external deformities.  TMs nml with good LR and mild cerumen bilat. Nose:  no external deformity.   Mouth:  Oral mucosa and oropharynx without lesions or exudates.  Teeth in good repair. Neck:  No deformities, masses  noted. Tender in the right trapezial distribution. Chest Wall:  Tenderness last time resolved.  Lungs:  Normal respiratory effort, chest expands symmetrically. Lungs are clear to  auscultation, no crackles or wheezes. No wheezing heard at all, air exchange reasonable Heart:  Normal rate and regular rhythm. S1 and S2 normal without gallop, murmur, click, rub or other extra sounds. Abdomen:  Bowel sounds positive,abdomen soft and non-tender without masses, organomegaly or hernias noted. Obese but less so and less proberant.   Impression & Recommendations:  Problem # 1:  DIABETES MELLITUS, TYPE II (ICD-250.00) Assessment Improved Nos appear much better. Will have pt seen in three months with A1C prior. Cont curr Medication as is and start regular walking. Now that he is not working, he has no excuse. His updated medication list for this problem includes:    Glipizide-metformin Hcl 5-500 Mg Tabs (Glipizide-metformin hcl) .Marland Kitchen... 1  tab by mouth in am and 4 tabs in pm...  Labs Reviewed: Creat: 0.9 (06/17/2009)    Reviewed HgBA1c results: 6.7 (09/22/2007)  7.0 (02/12/2007)  Problem # 2:  ESSENTIAL HYPERTENSION (ICD-401.9) Assessment: Unchanged Good control. Cont curr meds. Woukld benefit from at least small dose of ACEI. His updated medication list for this problem includes:    Metoprolol Succinate 50 Mg Tb24 (Metoprolol succinate) .Marland Kitchen... 1 daily by mouth  BP today: 118/84 Prior BP: 126/80 (07/01/2009)  Prior 10 Yr Risk Heart Disease: 27 % (11/30/2008)  Labs Reviewed: K+: 4.1 (06/17/2009) Creat: : 0.9 (06/17/2009)   Chol: 189 (10/01/2006)   HDL: 33.0 (10/01/2006)   LDL: 124 (10/01/2006)   TG: 159 (10/01/2006)  Problem # 3:  HYPERCHOLESTEROLEMIA, PURE (ICD-272.0) Assessment: Unchanged Now thast he can be seen regularly, this should be checked and started on statin for stabilization as well as to get his LDL as close to 70 as poss. Labs Reviewed: SGOT: 16 (06/17/2009)   SGPT: 18 (06/17/2009)  Prior 10 Yr Risk Heart Disease: 27 % (11/30/2008)   HDL:33.0 (10/01/2006)  LDL:124 (10/01/2006)  Chol:189 (10/01/2006)  Trig:159 (10/01/2006)  Complete Medication  List: 1)  Glipizide-metformin Hcl 5-500 Mg Tabs (Glipizide-metformin hcl) .Marland Kitchen.. 1  tab by mouth in am and 4 tabs in pm... 2)  Metoprolol Succinate 50 Mg Tb24 (Metoprolol succinate) .Marland Kitchen.. 1 daily by mouth 3)  Onetouch Ultra Test Strp (Glucose blood) .... Check blood sugar as directed 4)  Tramadol Hcl 50 Mg Tabs (Tramadol hcl) .Marland Kitchen.. 1 by mouth every 4-6 hrs as needed pain 5)  Phillips Colon Health Caps (Probiotic product) .... Two times a day 6)  [name] 20 Gm Pack (Lactulose) .... 20 grams at bedtime  Patient Instructions: 1)  RTC 3 mos with Dr Para March, with A1C and Bmet 250.00 prior. Bring his diary to this appt.  2)  Consider adding statin and ACEI now that can be seen regularly.  Current Allergies (reviewed today): No known allergies

## 2010-05-23 NOTE — Progress Notes (Signed)
   Phone Note Call from Patient   Summary of Call: Patient at pharmacy to pick up script e faxed in today after procedure--pt states he already takes the librax three times a day as prescribed today, even though medication not on medication sheet. Pt is asking for a pain medicine for his hip/leg pain.  Initial call taken by: Joylene John RN,  November 09, 2009 5:13 PM  Follow-up for Phone Call        Dr Jarold Motto notified of call from patient asking for pain medication. Told MD pt already has Librax at home he is using three times a day. MD stated he was not going to give pain medication, pt can use the librax as directed but nothing further to be given. Instructed pt as above. Pt to call PCP for further pain meds. Follow-up by: Joylene John RN,  November 09, 2009 5:15 PM

## 2010-05-23 NOTE — Assessment & Plan Note (Signed)
Summary: COUGHING;SNEEZING/DLO   Vital Signs:  Patient profile:   60 year old male Height:      68 inches Weight:      234.25 pounds Temp:     98.5 degrees F oral Pulse rate:   88 / minute Pulse rhythm:   regular BP sitting:   114 / 88  (left arm) Cuff size:   large  Vitals Entered By: Delilah Shan CMA (AAMA) (January 12, 2010 11:02 AM) CC: 1. Coughing, sneezing.    2.  Knot at bottom of throat.   History of Present Illness: Yellow sputum from throat.  Chills and sweats.  Going on for a few days.  Some nausea with the sweats.  Some cough.  No known sick contacts.  Myalgias.  Fatigued.  All symptoms worse at night.    "I feel a knot on the top of my chest".  Found recently, occ tender to palpation.  Also with similar lesion on R forearm.   Glucose had been controlled until recent illness, when it began to drift up some (but still not greatly elevated)  Allergies: No Known Drug Allergies  Physical Exam  General:  GEN: nad, alert and oriented HEENT: mucous membranes moist, TM w/o erythema, nasal epithelium injected, OP with cobblestoning NECK: supple w/o LA CV: rrr. PULM: ctab, no inc wob ABD: soft, +bs EXT: no edema  Chest wall with  ~1cm lesion, typical feeling for a lipoma.  No overlaying skin changes.  Similar lesion on R forearm.  Not tender to palpation.    Impression & Recommendations:  Problem # 1:  URI (ICD-465.9) I would treat supportively and see if this doesn't resolve.  Nontoxic and no increase in WOB.  If it doesn't get better, I would use the amoxil.  Pt understood the plan.  Can use nasal steroid in meantime for symptom relief. REst and fluids o/w.  He understood.  Printed rx for amoxil given to patient.   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   Complete Medication List: 1)  Glipizide-metformin Hcl 5-500 Mg Tabs (Glipizide-metformin hcl) .... 2  tab by mouth in am and 2 tabs in pm. 2)  Metoprolol Succinate 50 Mg Tb24 (Metoprolol succinate) .Marland Kitchen..  1 daily by mouth 3)  Onetouch Ultra Test Strp (Glucose blood) .... Check blood sugar as directed 4)  Tramadol Hcl 50 Mg Tabs (Tramadol hcl) .Marland Kitchen.. 1 by mouth every 4-6 hrs as needed pain 5)  Clidinium-chlordiazepoxide 2.5-5 Mg Caps (Clidinium-chlordiazepoxide) .Marland Kitchen.. 1 by mouth three times a day as needed 6)  Carafate 1 Gm/37ml Susp (Sucralfate) .Marland Kitchen.. 1 tbsp qid as needed 7)  Omeprazole 40 Mg Cpdr (Omeprazole) .... One tablet by mouth once daily 8)  Lidocaine Viscous 2 % Soln (Lidocaine hcl) .Marland Kitchen.. 15 ml q 4 hrs as needed 9)  Amoxicillin 875 Mg Tabs (Amoxicillin) .Marland Kitchen.. 1 by mouth two times a day x10d  Patient Instructions: 1)  Gargle with salt water, get some rest and drink plenty of fluids.  Use the veramyst two times a day for the nasal congestion.  Start the antibiotics if you aren't feeling better by Monday.  Take care.  Prescriptions: AMOXICILLIN 875 MG TABS (AMOXICILLIN) 1 by mouth two times a day x10d  #20 x 0   Entered and Authorized by:   Crawford Givens MD   Signed by:   Crawford Givens MD on 01/12/2010   Method used:   Print then Give to Patient   RxID:   [id number]   Current Allergies (  reviewed today): No known allergies

## 2010-05-23 NOTE — Miscellaneous (Signed)
Summary: [name] rx  Clinical Lists Changes  Medications: Added new medication of [name] 20 GM  PACK (LACTULOSE) 20 grams at bedtime - Signed Rx of [name] 20 GM  PACK (LACTULOSE) 20 grams at bedtime;  #1 bottle x 1;  Signed;  Entered by: Karl Bales RN;  Authorized by: Mardella Layman MD [facility];  Method used: Electronically to [facility]*, [address], [place], [place]  [address], Ph: [phone], Fax: [phone]    Prescriptions: [name] 20 GM  PACK (LACTULOSE) 20 grams at bedtime  #1 bottle x 1   Entered by:   Karl Bales RN   Authorized by:   Mardella Layman MD [facility]   Signed by:   Karl Bales RN on 07/25/2009   Method used:   Electronically to        [facility]* (retail)       [address]       [place], [place]  [address]       Ph: [phone]       Fax: [phone]   RxID:   [id number]

## 2010-05-23 NOTE — Miscellaneous (Signed)
Summary: clotest  Clinical Lists Changes  Orders: Added new Test order of TLB-H Pylori Screen Gastric Biopsy (83013-CLOTEST) - Signed 

## 2010-05-23 NOTE — Letter (Signed)
Summary: Diabetic Instructions  [facility]  [address]   [place], [place] [address]   Phone: [phone]  Fax: [phone]    [name] 06/11/1950 MRN: [id number]   _ X _   ORAL DIABETIC MEDICATION INSTRUCTIONS  The day before your procedure:   Take your diabetic pill as you do normally  The day of your procedure:   Do not take your diabetic pill    We will check your blood sugar levels during the admission process and again in Recovery before discharging you home  ________________________________________________________________________  _  _   INSULIN (LONG ACTING) MEDICATION INSTRUCTIONS (Lantus, NPH, 70/30, Humulin, Novolin-N)   The day before your procedure:   Take  your regular evening dose    The day of your procedure:   Do not take your morning dose    _  _   INSULIN (SHORT ACTING) MEDICATION INSTRUCTIONS (Regular, Humulog, Novolog)   The day before your procedure:   Do not take your evening dose   The day of your procedure:   Do not take your morning dose   _  _   INSULIN PUMP MEDICATION INSTRUCTIONS  We will contact the physician managing your diabetic care for written dosage instructions for the day before your procedure and the day of your procedure.  Once we have received the instructions, we will contact you.  

## 2010-05-23 NOTE — Assessment & Plan Note (Signed)
Summary: 3 M F/U DLO   Vital Signs:  Patient profile:   60 year old male Weight:      243.75 pounds Temp:     98.7 degrees F oral Pulse rate:   64 / minute Pulse rhythm:   regular BP sitting:   130 / 86  (left arm) Cuff size:   large  Vitals Entered By: Sydell Axon LPN (February 28, 2010 8:28 AM) CC: 3 Month follow-up   History of Present Illness: Abd pain continues per patient.  usually in RLQ per patient.  Constant per patient.  2 episodes of dysuria.  No known blood in urine or stool.  No diarrhea.  No mucous in stool.  "A lot of gas" and sometimes that is associated with the pain in RLQ.  No documented fevers.  Per patient, pain is worse with position changes.  Colonoscopy done 4/11.  "Going on for months."    Pt was late for appointment again today.  I told him that it negatively affected my ability to care for him (and other patients) if he could not keep his appointments or show up on time.  I told him that he could call ahead if running behind and and reschedule.  He has failed to do this.  "I thought my appointment was later."  I advised patient to look at his appointment data.  We have automated calls for reminders.    He didn't get previsit labs per prev instructions.  "I called here and they said I didn't need labs."  I told patient that his call would have triggered a phone note.  There is no phone note about this.  I didn't give order to void prev labs and no one would have done this without my okay.  I have taked with lab staff and no one gave order to cancel my prev lab orders.  Pt again stated that he was told he didn't need labs.   Allergies: No Known Drug Allergies  Review of Systems       See HPI.  Otherwise negative.    Physical Exam  General:  GEN: nad, alert and oriented, again able to get up and walk w/o any limp or grimace.  gets up easily and moves to exam table without any limp, grimace, or change in typical gait- similar to prev exam. HEENT: mucous  membranes moist NECK: supple w/o LA CV: regular rate and rhythm  PULM: ctab, no inc wob ABD: soft, +bs, not tender during exam when distracted, no masses, no rebound, he points to a nondermatomal distribution of skin during the exam on the r side of abdominal wall and says "it hurts all over" but he doesn't grimace on palpation when distracted. EXT: no edema SKIN: no acute rash  No CVA pain . bilateral lower extremity w/o weakness. 2+ DTRs.    Impression & Recommendations:  Problem # 1:  ABDOMINAL PAIN, GENERALIZED (ICD-789.07) >25 min spent with patient, at least half of which was spent on counseling re: plan.  I would not proceed until I talk with GI, other than hecking UA today.   Addendum and late entry- I talked to Dr. Jarold Motto about librax today by phone.  I can find no source for his complaint of pain.  Both Dr. Jarold Motto and I have reviewed his work up to this point and see no indication for further imaging/testing.  I will advise patient to use librax (which Dr. Jarold Motto supported).  If that doesn't help his  pain, I have nothing else to offer patient at this point.  See phone note.   Complete Medication List: 1)  Glipizide-metformin Hcl 5-500 Mg Tabs (Glipizide-metformin hcl) .... 2  tab by mouth in am and 2 tabs in pm. 2)  Metoprolol Succinate 50 Mg Tb24 (Metoprolol succinate) .Marland Kitchen.. 1 daily by mouth 3)  Onetouch Ultra Test Strp (Glucose blood) .... Check blood sugar as directed 4)  Tramadol Hcl 50 Mg Tabs (Tramadol hcl) .Marland Kitchen.. 1 by mouth every 4-6 hrs as needed pain 5)  Clidinium-chlordiazepoxide 2.5-5 Mg Caps (Clidinium-chlordiazepoxide) .Marland Kitchen.. 1 by mouth three times a day as needed 6)  Carafate 1 Gm/69ml Susp (Sucralfate) .Marland Kitchen.. 1 tbsp qid as needed 7)  Omeprazole 40 Mg Cpdr (Omeprazole) .... One tablet by mouth once daily 8)  Lidocaine Viscous 2 % Soln (Lidocaine hcl) .Marland Kitchen.. 15 ml q 4 hrs as needed  Patient Instructions: 1)  We'll let you know about your urine and I'll talk to the  GI clinic.  Take care.    Orders Added: 1)  Est. Patient Level IV [[address]]    Current Allergies (reviewed today): No known allergies   Laboratory Results   Urine Tests  Date/Time Received: February 28, 2010 9:00 AM   Routine Urinalysis   Color: yellow Appearance: Clear Glucose: negative   (Normal Range: Negative) Bilirubin: negative   (Normal Range: Negative) Ketone: negative   (Normal Range: Negative) Spec. Gravity: 1.010   (Normal Range: 1.003-1.035) Blood: negative   (Normal Range: Negative) pH: 6.0   (Normal Range: 5.0-8.0) Protein: negative   (Normal Range: Negative) Urobilinogen: 0.2   (Normal Range: 0-1) Nitrite: negative   (Normal Range: Negative) Leukocyte Esterace: negative   (Normal Range: Negative)

## 2010-05-23 NOTE — Letter (Signed)
Summary: Diabetic Instructions  [facility]  [address] [place], [place] [address]   Phone: [phone]  Fax: [phone]    ANDROS CHANNING 13-May-1950 MRN: [id number]   _ X _   ORAL DIABETIC MEDICATION INSTRUCTIONS  The day before your procedure:   Take your diabetic pill as you do normally  The day of your procedure:   Do not take your diabetic pill    We will check your blood sugar levels during the admission process and again in Recovery before discharging you home  ________________________________________________________________________  _  _   INSULIN (LONG ACTING) MEDICATION INSTRUCTIONS (Lantus, NPH, 70/30, Humulin, Novolin-N)   The day before your procedure:   Take  your regular evening dose    The day of your procedure:   Do not take your morning dose    _  _   INSULIN (SHORT ACTING) MEDICATION INSTRUCTIONS (Regular, Humulog, Novolog)   The day before your procedure:   Do not take your evening dose   The day of your procedure:   Do not take your morning dose   _  _   INSULIN PUMP MEDICATION INSTRUCTIONS  We will contact the physician managing your diabetic care for written dosage instructions for the day before your procedure and the day of your procedure.  Once we have received the instructions, we will contact you.

## 2010-05-23 NOTE — Assessment & Plan Note (Signed)
Vital Signs:  Patient profile:   60 year old male Height:      68 inches Weight:      243.75 pounds BMI:     37.20 Temp:     97.7 degrees F oral Pulse rate:   84 / minute Pulse rhythm:   regular BP sitting:   122 / 84  (left arm) Cuff size:   large  Vitals Entered By: Delilah Shan CMA Duncan Dull) (February 16, 2010 8:44 AM) CC: abdominal pain   History of Present Illness: According to patient: Lower abdominal pain.  Pain with bending over to L or R.  Pain getting up out of chair.  Getting worse per patient.  Going on for weeks.  Also in lower lower back.  Constant.  No FCVD.  Occ nausea.  No change in BMs.  No blood in stool/urine seen.  Hard to get up out of bed in the AM.  Nothing seems to help.  Has not tried anything to make it better.    Allergies: No Known Drug Allergies  Review of Systems       See HPI.  Otherwise negative.    Physical Exam  General:  GEN: nad, alert and oriented, able to get up and walk w/o any limp or grimace.  gets up easily and moves to exam table without any limp, grimace, or change in typical gait.  HEENT: mucous membranes moist NECK: supple w/o LA CV: regular rate and rhythm  PULM: ctab, no inc wob ABD: soft, +bs, not tender during exam when distracted, no masses, no rebound EXT: no edema SKIN: no acute rash  No CVA pain .   Impression & Recommendations:  Problem # 1:  ABDOMINAL PAIN, GENERALIZED (ICD-789.07)  Benign exam.  Not tender to palpation on distraction.  Able to sit up easily and no peritoneal signs.  I see no reason to explain his pain.  See notes on labs.  Pt reports 8/10 pain but has not change in movement to suggest discomfort.  After exam, I was told by staff that patient had admitted to stopping to by lottery tickets on the way to the appointment.  I see no objective evidence of abnormality on exam.    Orders: TLB-BMP (Basic Metabolic Panel-BMET) (80048-METABOL) TLB-CBC Platelet - w/Differential  (85025-CBCD) TLB-Hepatic/Liver Function Pnl (80076-HEPATIC) UA Dipstick w/o Micro (manual) (70623)  Complete Medication List: 1)  Glipizide-metformin Hcl 5-500 Mg Tabs (Glipizide-metformin hcl) .... 2  tab by mouth in am and 2 tabs in pm. 2)  Metoprolol Succinate 50 Mg Tb24 (Metoprolol succinate) .Marland Kitchen.. 1 daily by mouth 3)  Onetouch Ultra Test Strp (Glucose blood) .... Check blood sugar as directed 4)  Tramadol Hcl 50 Mg Tabs (Tramadol hcl) .Marland Kitchen.. 1 by mouth every 4-6 hrs as needed pain 5)  Clidinium-chlordiazepoxide 2.5-5 Mg Caps (Clidinium-chlordiazepoxide) .Marland Kitchen.. 1 by mouth three times a day as needed 6)  Carafate 1 Gm/98ml Susp (Sucralfate) .Marland Kitchen.. 1 tbsp qid as needed 7)  Omeprazole 40 Mg Cpdr (Omeprazole) .... One tablet by mouth once daily 8)  Lidocaine Viscous 2 % Soln (Lidocaine hcl) .Marland Kitchen.. 15 ml q 4 hrs as needed  Patient Instructions: 1)  You can get your results through our phone system.  Follow the instructions on the blue card. Let me know if the pain is getting worse.     Orders Added: 1)  Est. Patient Level III [76283] 2)  TLB-BMP (Basic Metabolic Panel-BMET) [80048-METABOL] 3)  TLB-CBC Platelet - w/Differential [85025-CBCD] 4)  TLB-Hepatic/Liver  Function Pnl [80076-HEPATIC] 5)  UA Dipstick w/o Micro (manual) [81002]    Current Allergies (reviewed today): No known allergies

## 2010-05-23 NOTE — Miscellaneous (Signed)
Summary: Switch to Omeprazole/ Dexilant not covered   Clinical Lists Changes  Medications: Changed medication from DEXILANT 60 MG CPDR (DEXLANSOPRAZOLE) 1 by mouth q am to OMEPRAZOLE 40 MG CPDR (OMEPRAZOLE) one tablet by mouth once daily - Signed Rx of OMEPRAZOLE 40 MG CPDR (OMEPRAZOLE) one tablet by mouth once daily;  #30 x 2;  Signed;  Entered by: Ok Anis CMA;  Authorized by: Mardella Layman MD [facility];  Method used: Electronically to [facility]*, [address], [place], [place]  [address], Ph: [phone], Fax: [phone]  Dexilant not coverd-Prior Authorization Denied  Prescriptions: OMEPRAZOLE 40 MG CPDR (OMEPRAZOLE) one tablet by mouth once daily  #30 x 2   Entered by:   Ok Anis CMA   Authorized by:   Mardella Layman MD [facility]   Signed by:   Ok Anis CMA on 11/18/2009   Method used:   Electronically to        [facility]* (retail)       [address]       [place], [place]  [address]       Ph: [phone]       Fax: [phone]   RxID:   [id number]

## 2010-05-23 NOTE — Progress Notes (Signed)
Summary: Triage  Medications Added LIDOCAINE VISCOUS 2 % SOLN (LIDOCAINE HCL) 15 ml q 4 hrs as needed       Phone Note Call from Patient   Caller: Patient Summary of Call: Pt stopped by.  Pt c/o hoarseness and congestion in throat.  Feels that he needs to clear throat.  Symptoms worse at night.  When he lays down mucus will run out of mouth.  Pt states symptoms did not start until after he had endoscopy on 11/09/09.  He wonders if it is from the medication that was used to numb his throat.  Pt concerned.   Initial call taken by: Ashok Cordia RN,  November 16, 2009 11:24 AM  Follow-up for Phone Call        viscous xylocaine prn Follow-up by: Mardella Layman MD Clementeen Graham,  November 16, 2009 1:45 PM  Additional Follow-up for Phone Call Additional follow up Details #1::        Pt notified.  Pt instructed to report back if no improvement. Additional Follow-up by: Ashok Cordia RN,  November 16, 2009 2:59 PM    New/Updated Medications: LIDOCAINE VISCOUS 2 % SOLN (LIDOCAINE HCL) 15 ml q 4 hrs as needed Prescriptions: LIDOCAINE VISCOUS 2 % SOLN (LIDOCAINE HCL) 15 ml q 4 hrs as needed  #450 ml x 0   Entered by:   Ashok Cordia RN   Authorized by:   Mardella Layman MD [facility]   Signed by:   Ashok Cordia RN on 11/16/2009   Method used:   Electronically to        [facility]* (retail)       [address]       [place], [place]  [address]       Ph: [phone]       Fax: [phone]   RxID:   [id number]

## 2010-05-23 NOTE — Progress Notes (Signed)
  Medications Added LISINOPRIL 10 MG TABS (LISINOPRIL) 1 once daily       Phone Note Outgoing Call   Call placed by: Scherrie Bateman, LPN,  March 22, 2010 9:52 AM Call placed to: Patient Summary of Call: LEFT MESSAGE FOR PT TO CALL BACK PER DR NISHAN PT NEEDS TO START LISINOPRIL 10 MG once daily  WITH BMET IN 8 WEEKS TO HELP PROTECT KIDNEYS D/T DIABETES. Initial call taken by: Scherrie Bateman, LPN,  March 22, 2010 9:53 AM  Follow-up for Phone Call        Phone call completed PT AWARE MED SENT VIA EMR. Follow-up by: Scherrie Bateman, LPN,  March 22, 2010 10:08 AM    New/Updated Medications: LISINOPRIL 10 MG TABS (LISINOPRIL) 1 once daily Prescriptions: LISINOPRIL 10 MG TABS (LISINOPRIL) 1 once daily  #30 x 11   Entered by:   Scherrie Bateman, LPN   Authorized by:   Colon Branch, MD, [facility]   Signed by:   Scherrie Bateman, LPN on 11/91/4782   Method used:   Electronically to        [facility]* (retail)       [address]       [place], [place]  [address]       Ph: [phone]       Fax: [phone]   RxID:   [id number]

## 2010-05-23 NOTE — Assessment & Plan Note (Signed)
Summary: abd pain//nauseated--ch.    History of Present Illness Visit Type: Follow-up Visit Primary GI MD: Sheryn Bison MD FACP FAGA Primary Provider: Laurita Quint, MD Requesting Provider: n/a Chief Complaint: Nausea, lower abd pain, low back pain, and pain in legs  History of Present Illness:   Chronic epigastric discomfort alleviated by small snacks and acids. He also complains of nausea but no vomiting. Recent colonoscopy was unremarkable. He denies NSAID use and is on currently probiotics and p.r.n. [name]. He takes tramadol for abdominal pain and is on oral medication for diabetes which is well-controlled. Last endoscopy was several years ago. I cannot see a previous ultrasound report. He denies abuse of NSAIDs, alcohol, illicit drugs, or any history of hepatitis or pancreatitis.   GI Review of Systems    Reports abdominal pain and  nausea.     Location of  Abdominal pain: lower abdomen.    Denies acid reflux, belching, bloating, chest pain, dysphagia with liquids, dysphagia with solids, heartburn, loss of appetite, vomiting, vomiting blood, weight loss, and  weight gain.        Denies anal fissure, black tarry stools, change in bowel habit, constipation, diarrhea, diverticulosis, fecal incontinence, heme positive stool, hemorrhoids, irritable bowel syndrome, jaundice, light color stool, liver problems, rectal bleeding, and  rectal pain.    Current Medications (verified): 1)  Glipizide-Metformin Hcl 5-500 Mg  Tabs (Glipizide-Metformin Hcl) .Marland Kitchen.. 1  Tab By Mouth in Am and 4 Tabs in Pm... 2)  Metoprolol Succinate 50 Mg  Tb24 (Metoprolol Succinate) .Marland Kitchen.. 1 Daily By Mouth 3)  Onetouch Ultra Test   Strp (Glucose Blood) .... Check Blood Sugar As Directed 4)  Tramadol Hcl 50 Mg Tabs (Tramadol Hcl) .Marland Kitchen.. 1 By Mouth Every 4-6 Hrs As Needed Pain 5)  Vear Clock Colon Health  Caps (Probiotic Product) .... Two Times A Day 6)  [name] 20 Gm  Pack (Lactulose) .... 20 Grams At  Bedtime  Allergies (verified): No Known Drug Allergies  Past History:  Past medical, surgical, family and social histories (including risk factors) reviewed for relevance to current acute and chronic problems.  Past Medical History: Reviewed history from 09/29/2008 and no changes required. 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 of SLEEP APNEA (ICD-780.57) SINUSITIS (ICD-473.9)   Nonobstructive coronary disease on cardiac cath on December 31, 2007.   Past Surgical History: Reviewed history from 07/25/2009 and no changes required. Childhood      Tonsillectomy.                       R Carpal tunnel release.  5/04              Cath - normal 5/1-08/25/02     [facility] Chest Pain  R/O'd 11/2003          R/O at ER 12/12/05        Trigger finger release, right long finger 04/25/06           Stress MUGA normal, EF 60% 11/22/06           Bilat Inguinal Hernia Repair, 5cm RLQAbd Lipoma Excision  (Dr  Newman) 03/17/07       Adenosine Myoview  low risk  07/17/07         EGD Esoph Stricture Dilated (Dr Marina Goodell) 10/28/08           Anterior cervical diskectomy and fusion (Dr Shon Baton) 10/29/08           Hematoma Evacuation (Dr Shon Baton) 11/02/08         Direct Laryngosocopy, Extubation under Anesthesia (Dr Lazarus Salines) 07/25/09           Colonoscopy  Divertics/Sigm  Poor Prep    High Fiber Diet   (Dr Jarold Motto)       5 years        Family  History: Reviewed history from 06/17/2009 and no changes required. Father: dec  57  Accident, fell off building Mother: A  72  EVFA [place], DM, HTN, high cholesterol Brother A  HTN, DM Brother A HTN,  DM Brother A DM Sister A  HTN, DM CV: (+) maternal uncle - heart failure HBP:  (+) self, mother, brothers x 2, sister DM: (+) self, mother, brothers x 3, sisters, father Prostate CA:  PGF and P. uncle  Stroke:  Negative No FH of Colon Cancer:  Social History: Reviewed history from 05/06/2008 and no changes required. 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  The patient denies allergy/sinus, anemia, anxiety-new, arthritis/joint pain, blood in urine, breast changes/lumps, change in vision, confusion, cough, coughing up blood, depression-new, fainting, fatigue, fever, headaches-new, hearing problems, heart murmur, heart rhythm changes, itching, menstrual pain, muscle pains/cramps, night sweats, nosebleeds, pregnancy symptoms, shortness of breath, skin rash, sleeping problems, sore throat, swelling of feet/legs, swollen lymph glands, thirst - excessive , urination - excessive , urination changes/pain, urine leakage, vision changes, and voice change.    Vital Signs:  Patient profile:   60 year old male Height:      68 inches Weight:      238 pounds BMI:     36.32 BSA:     2.20 Pulse rate:   76 / minute Pulse rhythm:   regular BP sitting:   120 / 82  (left arm) Cuff size:   regular  Vitals Entered By: Ok Anis CMA (October 28, 2009 11:30 AM)  Physical Exam  General:  Well developed, well nourished, no acute  distress.healthy appearing.   Head:  Normocephalic and atraumatic. Eyes:  PERRLA, no icterus.exam deferred to patient's ophthalmologist.   Abdomen:  Soft, nontender and nondistended. No masses, hepatosplenomegaly or hernias noted. Normal bowel sounds. Msk:  Symmetrical with no gross deformities. Normal posture. Extremities:  No clubbing, cyanosis, edema or deformities noted. Neurologic:  Alert and  oriented x4;  grossly normal neurologically. Psych:  Alert and cooperative. Normal mood and affect.depressed affect.     Impression & Recommendations:  Problem # 1:  ABDOMINAL PAIN, EPIGASTRIC (ICD-789.06) Assessment Unchanged Symptomatology suggestive of peptic ulcer disease. I scheduled endoscopy with examination for H. pylori and also would check labs and upper abdominal ultrasound. He's been placed on daily Dexilant p.r.n. Carafate suspension and continued avoidance of NSAIDs. TLB-CBC Platelet - w/Differential (85025-CBCD) TLB-BMP (Basic Metabolic Panel-BMET) (80048-METABOL) TLB-Hepatic/Liver Function Pnl (  80076-HEPATIC) TLB-TSH (Thyroid Stimulating Hormone) (84443-TSH) TLB-B12, Serum-Total ONLY (04540-J81) TLB-Ferritin (82728-FER) TLB-Folic Acid (Folate) (82746-FOL) TLB-IBC Pnl (Iron/FE;Transferrin) (83550-IBC) TLB-Amylase (82150-AMYL) TLB-Lipase (83690-LIPASE) TLB-Sedimentation Rate (ESR) (85652-ESR) TLB-H. Pylori Abs(Helicobacter Pylori) (86677-HELICO)  Problem # 2:  ANEMIA, IRON DEFICIENCY (ICD-280.9) Assessment: Improved Check Endoscopy, repeat CBC and anemia profile.  Problem # 3:  CHEST PAIN (ICD-786.50) Assessment: Improved  Patient Instructions: 1)  Begin Dexilant once daily.  Samples given. 2)  Prescriptions will be sent to your pharmacy for carafate and librax to use as needed. 3)  You are scheduled for an ultrasound and an upper endoscopy. 4)  Please go to the basement for lab work. 5)  The medication list was reviewed and reconciled.  All changed / newly prescribed  medications were explained.  A complete medication list was provided to the patient / caregiver. 6)  Copy sent to : Dr. Laurita Quint 7)  Conscious Sedation brochure given.  8)  Upper Endoscopy brochure given.  9)  Constipation and Hemorrhoids brochure given.   Appended Document: abd pain//nauseated--ch.    Clinical Lists Changes  Medications: Added new medication of CLIDINIUM-CHLORDIAZEPOXIDE 2.5-5 MG CAPS (CLIDINIUM-CHLORDIAZEPOXIDE) 1 by mouth three times a day as needed - Signed Added new medication of CARAFATE 1 GM/10ML  SUSP (SUCRALFATE) 1 tbsp qid as needed - Signed Added new medication of DEXILANT 60 MG CPDR (DEXLANSOPRAZOLE) 1 by mouth q am - Signed Changed medication from DEXILANT 60 MG CPDR (DEXLANSOPRAZOLE) 1 by mouth q am to DEXILANT 60 MG CPDR (DEXLANSOPRAZOLE) 1 by mouth q am Rx of CLIDINIUM-CHLORDIAZEPOXIDE 2.5-5 MG CAPS (CLIDINIUM-CHLORDIAZEPOXIDE) 1 by mouth three times a day as needed;  #90 x 3;  Signed;  Entered by: Ashok Cordia RN;  Authorized by: Mardella Layman MD [facility];  Method used: Print then Give to Patient Rx of CARAFATE 1 GM/10ML  SUSP (SUCRALFATE) 1 tbsp qid as needed;  #14 oz x 3;  Signed;  Entered by: Ashok Cordia RN;  Authorized by: Mardella Layman MD [facility];  Method used: Print then Give to Patient Rx of DEXILANT 60 MG CPDR (DEXLANSOPRAZOLE) 1 by mouth q am;  #30 x 6;  Signed;  Entered by: Ashok Cordia RN;  Authorized by: Mardella Layman MD [facility];  Method used: Print then Give to Patient Orders: Added new Test order of EGD (EGD) - Signed Added new Test order of Ultrasound Abdomen (UAS) - Signed    Prescriptions: DEXILANT 60 MG CPDR (DEXLANSOPRAZOLE) 1 by mouth q am  #30 x 6   Entered by:   Ashok Cordia RN   Authorized by:   Mardella Layman MD [facility]   Signed by:   Ashok Cordia RN on 10/28/2009   Method used:   Print then Give to Patient   RxID:   [id number] CARAFATE 1 GM/10ML  SUSP (SUCRALFATE) 1 tbsp qid as needed  #14 oz x 3    Entered by:   Ashok Cordia RN   Authorized by:   Mardella Layman MD [facility]   Signed by:   Ashok Cordia RN on 10/28/2009   Method used:   Print then Give to Patient   RxID:   [id number] CLIDINIUM-CHLORDIAZEPOXIDE 2.5-5 MG CAPS (CLIDINIUM-CHLORDIAZEPOXIDE) 1 by mouth three times a day as needed  #90 x 3   Entered by:   Ashok Cordia RN   Authorized by:   Mardella Layman MD [facility]   Signed by:   Ashok Cordia RN on 10/28/2009   Method used:   Print then Give to  Patient   RxID:   [id number]

## 2010-05-23 NOTE — Letter (Signed)
Summary: [facility] Instructions  [facility]  [address] [place], [place] [address]   Phone: [phone]  Fax: [phone]       [name]    02-Feb-1951    MRN: [id number]        Procedure Day /Date: Monday, 07/25/09     Arrival Time: 10:30      Procedure Time: 11:30     Location of Procedure:                    [name] (4th Floor)                        PREPARATION FOR COLONOSCOPY WITH MOVIPREP   Starting 5 days prior to your procedure 07/20/09 do not eat nuts, seeds, popcorn, corn, beans, peas,  salads, or any raw vegetables.  Do not take any fiber supplements (e.g. Metamucil, Citrucel, and Benefiber).  THE DAY BEFORE YOUR PROCEDURE         DATE: 07/24/09  DAY: Sunday  1.  Drink clear liquids the entire day-NO SOLID FOOD  2.  Do not drink anything colored red or purple.  Avoid juices with pulp.  No orange juice.  3.  Drink at least 64 oz. (8 glasses) of fluid/clear liquids during the day to prevent dehydration and help the prep work efficiently.  CLEAR LIQUIDS INCLUDE: Water Jello Ice Popsicles Tea (sugar ok, no milk/cream) Powdered fruit flavored drinks Coffee (sugar ok, no milk/cream) Gatorade Juice: apple, white grape, white cranberry  Lemonade Clear bullion, consomm, broth Carbonated beverages (any kind) Strained chicken noodle soup Hard Candy                             4.  In the morning, mix first dose of MoviPrep solution:    Empty 1 Pouch A and 1 Pouch B into the disposable container    Add lukewarm drinking water to the top line of the container. Mix to dissolve    Refrigerate (mixed solution should be used within 24 hrs)  5.  Begin drinking the prep at 5:00 p.m. The MoviPrep container is divided by 4 marks.   Every 15 minutes drink the solution down to the next mark (approximately 8 oz) until the full liter is complete.   6.  Follow completed prep with 16 oz of clear liquid of your choice (Nothing red  or purple).  Continue to drink clear liquids until bedtime.  7.  Before going to bed, mix second dose of MoviPrep solution:    Empty 1 Pouch A and 1 Pouch B into the disposable container    Add lukewarm drinking water to the top line of the container. Mix to dissolve    Refrigerate  THE DAY OF YOUR PROCEDURE      DATE: 07/25/09  DAY: Monday  Beginning at 6:30a.m. (5 hours before procedure):         1. Every 15 minutes, drink the solution down to the next mark (approx 8 oz) until the full liter is complete.  2. Follow completed prep with 16 oz. of clear liquid of your choice.    3. You may drink clear liquids until 9:30 (2 HOURS BEFORE PROCEDURE).   MEDICATION INSTRUCTIONS  Unless otherwise instructed, you should take regular prescription medications with a small sip of water   as early as possible the morning of your procedure.  Diabetic patients - see separate instructions.                  OTHER INSTRUCTIONS  You will need a responsible adult at least 61 years of age to accompany you and drive you home.   This person must remain in the waiting room during your procedure.  Wear loose fitting clothing that is easily removed.  Leave jewelry and other valuables at home.  However, you may wish to bring a book to read or  an iPod/MP3 player to listen to music as you wait for your procedure to start.  Remove all body piercing jewelry and leave at home.  Total time from sign-in until discharge is approximately 2-3 hours.  You should go home directly after your procedure and rest.  You can resume normal activities the  day after your procedure.  The day of your procedure you should not:   Drive   Make legal decisions   Operate machinery   Drink alcohol   Return to work  You will receive specific instructions about eating, activities and medications before you leave.    The above instructions have been reviewed and explained to me by    _______________________    I fully understand and can verbalize these instructions _____________________________ Date _________

## 2010-05-23 NOTE — Letter (Signed)
Summary: Nadara Eaton letter  [place] [facility]  [address] [place], [place] [address]   Phone: [phone]  Fax: [phone]       11/29/2009 MRN: [id number]  TOBIN WITUCKI [address] [place], [place]  [address]  Dear Mr. [facility],  [facility] [place], and [facility] [place] announce the retirement of Arta Silence, M.D., from full-time practice at the [facility] office effective October 20, 2009 and his plans of returning part-time.  It is important to Dr. Hetty Ely and to our practice that you understand that [facility] has seven physicians in our office for your health care needs.  We will continue to offer the same exceptional care that you have today.    Dr. Hetty Ely has spoken to many of you about his plans for retirement and returning part-time in the fall.   We will continue to work with you through the transition to schedule appointments for you in the office and meet the high standards that [facility] is committed to.   Again, it is with great pleasure that we share the news that Dr. Hetty Ely will return to [facility] at [facility] in October of 2011 with a reduced schedule.    If you have any questions, or would like to request an appointment with one of our physicians, please call us at [phone] and press the option for Scheduling an appointment.  We take pleasure in providing you with excellent patient care and look forward to seeing you at your next office visit.  Our [facility] Physicians are:  Tillman Abide, M.D. Laurita Quint, M.D. Roxy Manns, M.D. Kerby Nora, M.D. Hannah Beat, M.D. Ruthe Mannan, M.D. We proudly welcomed Raechel Ache, M.D. and Eustaquio Boyden, M.D. to the practice in July/August 2011.  Sincerely,  [facility]

## 2010-05-23 NOTE — Letter (Signed)
Summary: Patient [facility] Biopsy Results  [facility]  [address] [place], [place] [address]   Phone: [phone]  Fax: [phone]        November 11, 2009 MRN: [id number]    [name] [address] [place], [place]  [address]    Dear [name],  I am pleased to inform you that the biopsies taken during your recent endoscopic examination did not show any evidence of cancer upon pathologic examination.bIOPSIES ++ FOR HELICOBACTER...RX. TO BE CALLED IN.  Additional information/recommendations:  __No further action is needed at this time.  Please follow-up with      your primary care physician for your other healthcare needs.  __ Please call [phone] to schedule a return visit to review      your condition.  __ Continue with the treatment plan as outlined on the day of your      exam.  __ You should have a repeat endoscopic examination for this problem              in _ months/years.   Please call us if you are having persistent problems or have questions about your condition that have not been fully answered at this time.  Sincerely,  Mardella Layman MD [facility]  This letter has been electronically signed by your physician.  Appended Document: Patient Notice-Endo Biopsy Results letter mailed.

## 2010-05-23 NOTE — Assessment & Plan Note (Signed)
Summary: 30 MIN APPT FOLLOW UP/[facility]   Vital Signs:  Patient profile:   60 year old male Height:      68 inches Weight:      244.75 pounds BMI:     37.35 Temp:     98.4 degrees F oral Pulse rate:   84 / minute Pulse rhythm:   regular BP sitting:   120 / 90  (left arm) Cuff size:   large  Vitals Entered By: Delilah Shan CMA Duncan Dull) (March 09, 2010 10:49 AM) CC: DM2, hip pain Is Patient Diabetic? Yes   History of Present Illness: Diabetes:  Using medications without difficulties:yes Hypoglycemic episodes:no Hyperglycemic episodes:no Feet problems:no Blood Sugars averaging: ~140  Labs d/w patient. See plan.   R hip pain.  Prev seen by Dr. Ethelene Hal.  Pain with walking and certain positions.  Occ radiation down leg.  No weakness.   No c/o abdominal pain today.   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 of SLEEP APNEA (ICD-780.57) SINUSITIS (ICD-473.9)  Nonobstructive coronary disease on cardiac cath on December 31, 2007.   Review of Systems       See HPI.  Otherwise negative.    Physical Exam  General:  NAD MMM RRR  CTAB abdominal exam soft and not tender to palpation  R hip with pain on int rotation, not on ext rotation.  SLR tight but w/o radicular symptoms.  distally NV intact except for  mild decrease in sensation on monofilament exam on R foot   Impression & Recommendations:  Problem # 1:  ABDOMINAL PAIN, EPIGASTRIC (ICD-789.06) >25 min spent with patient, at least half of which was spent on counseling re: plan.  No intervention for abdominal pain .  Problem # 2:  HYPERCHOLESTEROLEMIA, PURE (ICD-272.0) d/w patient GU:[id number] loss and will need to recheck after weight loss.  He understood.   Problem # 3:  DIABETES MELLITUS, TYPE II (ICD-250.00) D/w patient QQ:VZDGLO and foods . See plan.  His updated medication list for this problem includes:    Glipizide-metformin Hcl 5-500 Mg Tabs (Glipizide-metformin hcl) .Marland Kitchen... 2  tab by mouth in am and 2 tabs in pm.  Problem # 4:  HIP PAIN, RIGHT (ICD-719.45) requesting records from Dr. Ethelene Hal.  I'll contact patient when those are available.  Prev injected per patient.  His updated medication list for this problem includes:    Tramadol Hcl 50 Mg Tabs (Tramadol hcl) .Marland Kitchen... 1 by mouth every 4-6 hrs as needed pain  Complete Medication List: 1)  Glipizide-metformin Hcl 5-500 Mg Tabs (Glipizide-metformin hcl) .... 2  tab by mouth in am and 2 tabs in pm. 2)  Metoprolol Succinate 50 Mg Tb24 (Metoprolol succinate) .Marland Kitchen.. 1 daily by mouth 3)  Onetouch Ultra Test Strp (Glucose blood) .... Check blood sugar as directed 4)  Tramadol Hcl 50 Mg Tabs (Tramadol hcl) .Marland Kitchen.. 1 by mouth every 4-6 hrs as needed pain 5)  Clidinium-chlordiazepoxide 2.5-5 Mg Caps (Clidinium-chlordiazepoxide) .Marland Kitchen.. 1 by mouth three times a day as needed 6)  Carafate 1 Gm/29ml  Susp (Sucralfate) .Marland Kitchen.. 1 tbsp qid as needed 7)  Omeprazole 40 Mg Cpdr (Omeprazole) .... One tablet by mouth once daily 8)  Lidocaine Viscous 2 % Soln (Lidocaine hcl) .Marland Kitchen.. 15 ml q 4 hrs as needed  Patient Instructions: 1)  We'll ask for the records from Dr. Ethelene Hal.  Don't change your meds in the meantime.  I want you to keep working on your diet and weight.  We needs to recheck your cholesterol and sugar test  in 3-4 months.  Please get a lab visit a few days before the visit with me.  2)  lipid/A1c, 250.00. 3)  Take care.  Prescriptions: METOPROLOL SUCCINATE 50 MG  TB24 (METOPROLOL SUCCINATE) 1 daily by mouth  #90 x 3   Entered and Authorized by:   Crawford Givens MD   Signed by:   Crawford Givens MD on 03/09/2010   Method used:   Electronically to        [facility]* (retail)       [address]       [place], [place]  [address]       Ph: [phone]       Fax: [phone]   RxID:   [id number]    Orders Added: 1)  Est. Patient Level IV [401[address]]    Current Allergies (reviewed today): No known allergies

## 2010-05-23 NOTE — Progress Notes (Signed)
Summary: Medication   Phone Note Call from Patient Call back at Home Phone [phone]   Caller: Patient Call For: Dr. Jarold Motto Reason for Call: Talk to Nurse Summary of Call: Wants to know if he should finish taking his Dexilant Initial call taken by: Karna Christmas,  December 12, 2009 12:24 PM  Follow-up for Phone Call        Questions answered re Dexilant samples.  Pt has finished Prevpack.  Pt asks if he needs follow up appt. Follow-up by: Ashok Cordia RN,  December 12, 2009 3:00 PM  Additional Follow-up for Phone Call Additional follow up Details #1::        ONLY IF STILL WITH PROBLEMS Additional Follow-up by: Mardella Layman MD Clementeen Graham,  December 12, 2009 3:25 PM    Additional Follow-up for Phone Call Additional follow up Details #2::    Pt notified. Follow-up by: Ashok Cordia RN,  December 12, 2009 4:23 PM

## 2010-05-23 NOTE — Procedures (Signed)
Summary: Upper Endoscopy  Patient: [name] Note: All result statuses are Final unless otherwise noted.  Tests: (1) Upper Endoscopy (EGD)   EGD Upper Endoscopy       DONE     [facility]     [address]     [place], [place]  [address]           ENDOSCOPY PROCEDURE REPORT           PATIENT:  [name]  MR#:  [id number]     BIRTHDATE:  1950-10-24, 58 yrs. old  GENDER:  male           ENDOSCOPIST:  Vania Rea. Jarold Motto, MD, [facility]     Referred by:           PROCEDURE DATE:  11/09/2009     PROCEDURE:  EGD with biopsy     ASA CLASS:  Class II     INDICATIONS:  abdominal pain despite treatment           MEDICATIONS:   Fentanyl 50 mcg IV, Versed 7 mg IV     TOPICAL ANESTHETIC:           DESCRIPTION OF PROCEDURE:   After the risks benefits and     alternatives of the procedure were thoroughly explained, informed     consent was obtained.  The LB GIF-H180 [id number] endoscope was     introduced through the mouth and advanced to the second portion of     the duodenum, without limitations.  The instrument was slowly     withdrawn as the mucosa was fully examined.     <<PROCEDUREIMAGES>>           irregular Z-line. biopsies done.  The duodenal bulb was normal in     appearance, as was the postbulbar duodenum.  The stomach was     entered and closely examined. The antrum, angularis, and lesser     curvature were well visualized, including a retroflexed view of     the cardia and fundus. The stomach wall was normally distensable.     The scope passed easily through the pylorus into the duodenum.     biopsies done.  The esophagus and gastroesophageal junction were     completely normal in appearance.    Retroflexed views revealed no     abnormalities.    The scope was then withdrawn from the patient     and the procedure completed.           COMPLICATIONS:  None           ENDOSCOPIC IMPRESSION:     1) Irregular Z-line     2) Normal duodenum     3) Normal  stomach     4) Normal esophagus     1.chronic abdominal pain ?? etiology.PROBABLE FUNCTIONAL     DYSPEPSIA.RECENT LABS AND ULTRASOUND EXAM NORMAL.     2.R/O BARRETT'S MUCOSA,R/O H.PYLORI.     RECOMMENDATIONS:     1) Await biopsy results     2) Rx CLO if positive     3) continue PPI     TRIAL OF TID LIBRAX AC.#100, REFILL X3.I SEE NO INDICATION FOR     NARCOTIC USE,MAY NEED PAIN CLINIC REFERRAL.           REPEAT EXAM:  No           ______________________________     Vania Rea. Jarold Motto, MD, [facility]  CC:  Arta Silence, MD           n.     Rosalie DoctorMarland Kitchen   Vania Rea. Patterson at 11/09/2009 02:48 PM           Haskins, Allyne Gee, [phone]  Note: An exclamation mark (!) indicates a result that was not dispersed into the flowsheet. Document Creation Date: 11/09/2009 2:49 PM _______________________________________________________________________  (1) Order result status: Final Collection or observation date-time: 11/09/2009 14:40 Requested date-time:  Receipt date-time:  Reported date-time:  Referring Physician:   Ordering Physician: Sheryn Bison [phone]) Specimen Source:  Source: Launa Grill Order Number: [id number] Lab site:

## 2010-05-23 NOTE — Progress Notes (Signed)
  Phone Note Outgoing Call   Summary of Call: Please call patient.  Urine labs are normal.  I talked to Dr. Jarold Motto about librax. I would use librax per prev rx.  If this doesn't help his pain, I have nothing else to offer at this point.    He still needs DM2 follow up.  He needs fasting A1c/lipid/MALB with 30 min OV a few days after that.  250.00. There is no history of any phone call from him to cancel the labs that were previously ordered.  I have talked with the lab staff and there is no record that the original orders were ever cancelled.    Follow-up for Phone Call        Patient Advised. Lab appt  given to patient. 03/02/10  Patient says he will schedule follow up appt. after his lab visit. Follow-up by: Delilah Shan CMA Lux Skilton Dull),  March 01, 2010 4:46 PM

## 2010-05-23 NOTE — Assessment & Plan Note (Signed)
Summary: F1Y/DM      Allergies Added: NKDA  Referring Provider:  na Primary Provider:  Laurita Quint, MD  CC:  sob.  History of Present Illness: [name] is seen today for F/U of SSCP.  He has had a normal cath in 2008.  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  He has had two neck surgeries recently the last in June by Dr Shon Baton.  They were apparantly complicated and he still sees Dr Ethelene Hal for "pain".  Last night he had a lot of sharp pain in the left side of his chest radiating to the left arm with paresthesias.  It sounds radicular and clearly not cardiac.  He has some exertional dyspnea especially with the pain.  He has had a normal EF in the past with no smoking or chronic lung disease.  He has been quite sedentary since his bilateral neck surgery and is deconditioned  His A1c has been in the 7's.  We discussed his diet.  He needs to eat less carbs and also less fruit.   Discussed exercise program as he is too sedentary.   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) 7)  Costochondritis  (ICD-733.6) 8)  Chest Pain  (ICD-786.50) 9)  Chest Pain  (ICD-786.50) 10)  Gerd  (ICD-530.81) 11)  Hand Pain, Right Long Finger and Midpalm  (ICD-729.5) 12)  Calcific Tendinitis  (ICD-727.82) 13)  Hypercholesterolemia, Pure  (ICD-272.0) 14)  Essential Hypertension  (ICD-401.9) 15)  Dyspnea  (ICD-786.05) 16)  Foot Pain, Right  (ICD-729.5) 17)  Esophageal Stricture  (ICD-530.3) 18)  Muscle Spasm  (ICD-728.85) 19)  Anxiety Depression  (ICD-300.4) 20)  Tia  (ICD-435.9) 21)  Uri  (ICD-465.9) 22)  Epididymitis  (ICD-604.90) 23)  Back Pain With Radiculopathy  (ICD-729.2) 24)  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 of Sleep Apnea  (ICD-780.57) 29)  Sinusitis  (ICD-473.9)  Current Medications (verified): 1)  Glipizide-Metformin Hcl 5-500 Mg  Tabs (Glipizide-Metformin Hcl) .... 2  Tab By Mouth in Am and 2 Tabs in Pm. 2)  Metoprolol Succinate 50 Mg  Tb24 (Metoprolol Succinate) .Marland Kitchen.. 1 Daily By Mouth 3)  Onetouch Ultra Test   Strp (Glucose Blood) .... Check Blood Sugar As Directed 4)  Tramadol Hcl 50 Mg Tabs (Tramadol Hcl) .Marland Kitchen.. 1 By Mouth Every 4-6 Hrs As Needed Pain 5)  Clidinium-Chlordiazepoxide 2.5-5 Mg Caps (Clidinium-Chlordiazepoxide) .Marland Kitchen.. 1 By Mouth Three Times A Day As Needed 6)  Carafate 1 Gm/76ml  Susp (Sucralfate) .Marland Kitchen.. 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 of SLEEP APNEA (ICD-780.57) SINUSITIS (ICD-473.9)  Nonobstructive coronary disease on cardiac cath on December 31, 2007.   Past Surgical History: Last updated: 12/05/2009 Childhood      Tonsillectomy.                       R Carpal tunnel release.  5/04              Cath - normal 5/1-08/25/02     [facility] Chest Pain  R/O'd 11/2003          R/O at ER 12/12/05        Trigger finger release,  right long finger 04/25/06           Stress MUGA normal, EF 60% 11/22/06           Bilat Inguinal Hernia Repair, 5cm RLQAbd Lipoma Excision  (Dr Ezzard Standing) 03/17/07       Adenosine Myoview  low risk  07/17/07         EGD Esoph Stricture Dilated (Dr Marina Goodell) 10/28/08           Anterior cervical diskectomy and fusion (Dr Shon Baton) 10/29/08           Hematoma Evacuation (Dr Shon Baton) 11/02/08         Direct Laryngosocopy, Extubation under Anesthesia (Dr Lazarus Salines) 07/25/09           Colonoscopy  Divertics/Sigm  Poor Prep    High Fiber Diet   (Dr Jarold Motto)       5 years  Family History: Last updated: 06/17/2009 Father: dec  25  Accident, fell off building Mother: A  72  EVFA Shoffner, DM, HTN, high cholesterol Brother A  HTN, DM Brother A HTN,  DM Brother A DM Sister A  HTN, DM CV: (+) maternal uncle - heart failure HBP:  (+) self, mother, brothers x 2, sister DM: (+) self, mother, brothers x 3, sisters, father Prostate CA:  PGF and P. 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   Vital Signs:  Patient profile:   60 year old male Height:      68 inches Weight:      247 pounds BMI:     37.69 Pulse rate:   70 / minute Resp:     14 per minute BP sitting:   138 / 87  (left arm)  Vitals  Entered By: Kem Parkinson (March 22, 2010 8:57 AM)  Physical Exam  General:  Affect appropriate Healthy:  appears stated age HEENT: normal Neck supple with no adenopathy JVP normal no bruits no thyromegaly Lungs clear with no wheezing and good diaphragmatic motion Heart:  S1/S2 no murmur,rub, gallop or click PMI normal Abdomen: benighn, BS positve, no tenderness, no AAA no bruit.  No HSM or HJR Distal pulses intact with no bruits No edema Neuro non-focal Skin warm and dry    Impression & Recommendations:  Problem # 1:  CHEST PAIN (ICD-786.50) Non cardiac  Continue BB and ASA His updated medication list for this problem includes:    Metoprolol Succinate 50 Mg Tb24 (Metoprolol succinate) .Marland Kitchen... 1 daily by mouth  Problem # 2:  HYPERCHOLESTEROLEMIA, PURE (ICD-272.0) Per primary consider statin Rx given DM CHOL: 211 (03/02/2010)   LDL: 124 (10/01/2006)   HDL: 43.10 (03/02/2010)   TG: 105.0 (03/02/2010)  Problem # 3:  ESSENTIAL HYPERTENSION (ICD-401.9) Per primary.  Should be on ACE given DM  Will call in Lisinopril 10 mg and F/U BMET in 8 weeks His updated medication list for this problem includes:    Metoprolol Succinate 50 Mg Tb24 (Metoprolol succinate) .Marland Kitchen... 1 daily by mouth  Patient Instructions: 1)  Your physician recommends that you schedule a follow-up appointment in: YEAR WITH DR Eden Emms 2)  Your physician recommends that you continue on your current medications as directed. Please refer to the Current Medication list given to you today.

## 2010-05-23 NOTE — Letter (Signed)
Summary: EGD Instructions  [facility]  [address] [place], [place] [address]   Phone: [phone]  Fax: [phone]       [name]    May 15, 1950    MRN: [id number]       Procedure Day [name]: Wednesday, 11/09/09     Arrival Time:  1:30     Procedure Time: 2:30     Location of Procedure:                    _X  _ [facility] (4th Floor)    PREPARATION FOR ENDOSCOPY   On 11/09/09 THE DAY OF THE PROCEDURE:  1.   No solid foods, milk or milk products are allowed after midnight the night before your procedure.  2.   Do not drink anything colored red or purple.  Avoid juices with pulp.  No orange juice.  3.  You may drink clear liquids until 12:30, which is 2 hours before your procedure.                                                                                                CLEAR LIQUIDS INCLUDE: Water Jello Ice Popsicles Tea (sugar ok, no milk/cream) Powdered fruit flavored drinks Coffee (sugar ok, no milk/cream) Gatorade Juice: apple, white grape, white cranberry  Lemonade Clear bullion, consomm, broth Carbonated beverages (any kind) Strained chicken noodle soup Hard Candy   MEDICATION INSTRUCTIONS  Unless otherwise instructed, you should take regular prescription medications with a small sip of water as early as possible the morning of your procedure.    .               OTHER INSTRUCTIONS  You will need a responsible adult at least 61 years of age to accompany you and drive you home.   This person must remain in the waiting room during your procedure.  Wear loose fitting clothing that is easily removed.  Leave jewelry and other valuables at home.  However, you may wish to bring a book to read or an iPod/MP3 player to listen to music as you wait for your procedure to start.  Remove all body piercing jewelry and leave at home.  Total time from sign-in until discharge is approximately 2-3 hours.  You should  go home directly after your procedure and rest.  You can resume normal activities the day after your procedure.  The day of your procedure you should not:   Drive   Make legal decisions   Operate machinery   Drink alcohol   Return to work  You will receive specific instructions about eating, activities and medications before you leave.    The above instructions have been reviewed and explained to me by   _______________________    I fully understand and can verbalize these instructions _____________________________ Date _________

## 2010-05-23 NOTE — Assessment & Plan Note (Signed)
Summary: FLU SHOT/CLE  Nurse Visit   Allergies: No Known Drug Allergies  Orders Added: 1)  Admin 1st Vaccine [90471] 2)  Flu Vaccine 3yrs + [90658]  Flu Vaccine Consent Questions     Do you have a history of severe allergic reactions to this vaccine? no    Any prior history of allergic reactions to egg and/or gelatin? no    Do you have a sensitivity to the preservative Thimersol? no    Do you have a past history of Guillan-Barre Syndrome? no    Do you currently have an acute febrile illness? no    Have you ever had a severe reaction to latex? no    Vaccine information given and explained to patient? yes    Are you currently pregnant? no    Lot Number:[id number]   Exp Date:10/21/2010   Site Given  Left Deltoid IMu  

## 2010-05-23 NOTE — Letter (Signed)
Summary: [facility] No Show Letter  [facility]  [address] [place], [place] [address]   Phone: [phone]  Fax: [phone]    11/30/2009 MRN: [id number]  [name] [address] [place], [place]  [address]   Dear [name],   Our records indicate that you missed your scheduled appointment with ____lab_________________ on __8.10.11__________.  Please contact this office to reschedule your appointment as soon as possible.  It is important that you keep your scheduled appointments with your physician, so we can provide you the best care possible.  Please be advised that there may be a charge for "no show" appointments.    Sincerely,   [place] [facility]

## 2010-05-23 NOTE — Assessment & Plan Note (Signed)
Summary: 2 WEEK F/U..AM.    History of Present Illness Visit Type: Follow-up Visit Primary GI MD: Sheryn Bison MD FACP FAGA Primary Provider: Laurita Quint, MD Requesting Provider: n/a Chief Complaint: F/u for GERD, and lower abd pain that radiates to his back. Pt states GERD is better but still having lower abd pain that radiates to his back  History of Present Illness:   Doing Well without real complaints. LAD showed mild iron deficiency unexplained etiology. Last endoscopy was in March of 9. He is 5 years out from his colonoscopy.   GI Review of Systems    Reports abdominal pain.     Location of  Abdominal pain: lower abdomen.    Denies acid reflux, belching, bloating, chest pain, dysphagia with liquids, dysphagia with solids, heartburn, loss of appetite, nausea, vomiting, vomiting blood, weight loss, and  weight gain.        Denies anal fissure, black tarry stools, change in bowel habit, constipation, diarrhea, diverticulosis, fecal incontinence, heme positive stool, hemorrhoids, irritable bowel syndrome, jaundice, light color stool, liver problems, rectal bleeding, and  rectal pain.    Current Medications (verified): 1)  Glipizide-Metformin Hcl 5-500 Mg  Tabs (Glipizide-Metformin Hcl) .Marland Kitchen.. 1  Tab By Mouth in Am and 4 Tabs in Pm... 2)  Metoprolol Succinate 50 Mg  Tb24 (Metoprolol Succinate) .Marland Kitchen.. 1 Daily By Mouth 3)  Onetouch Ultra Test   Strp (Glucose Blood) .... Check Blood Sugar As Directed 4)  Tramadol Hcl 50 Mg Tabs (Tramadol Hcl) .Marland Kitchen.. 1 By Mouth Q 4-6 Hrs As Needed Pain  Allergies (verified): No Known Drug Allergies  Past History:  Past medical, surgical, family and social histories (including risk factors) reviewed for relevance to current acute and chronic problems.  Past Medical History: Reviewed history from 09/29/2008 and no changes required. 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 of SLEEP APNEA (ICD-780.57) SINUSITIS (ICD-473.9)   Nonobstructive coronary disease on cardiac cath on December 31, 2007.   Past Surgical History: Reviewed history from 11/17/2008 and no changes required. Childhood      Tonsillectomy.                       R Carpal tunnel release.  5/04              Cath - normal 5/1-08/25/02     [facility] Chest Pain  R/O'd 11/2003          R/O at ER 12/12/05        Trigger finger release, right long finger 04/25/06           Stress MUGA normal, EF 60% 11/22/06           Bilat Inguinal Hernia Repair, 5cm RLQAbd Lipoma Excision  (Dr Ezzard Standing) 03/17/07       Adenosine Myoview  low risk  07/17/07         EGD Esoph Stricture Dilated (Dr Marina Goodell) 10/28/08           Anterior cervical diskectomy and fusion (Dr Shon Baton) 10/29/08           Hematoma Evacuation (Dr  Brooks) 11/02/08         Direct Laryngosocopy, Extubation under Anesthesia (Dr Lazarus Salines)        Family History: Reviewed history from 06/17/2009 and no changes required. Father: dec  57  Accident, fell off building Mother: A  72  EVFA Shoffner, DM, HTN, high cholesterol Brother A  HTN, DM Brother A HTN,  DM Brother A DM Sister A  HTN, DM CV: (+) maternal uncle - heart failure HBP:  (+) self, mother, brothers x 2, sister DM: (+) self, mother, brothers x 3, sisters, father Prostate CA:  PGF and P. uncle  Stroke:  Negative No FH of Colon Cancer:  Social History: Reviewed history from 05/06/2008 and no  changes required. 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  The patient denies allergy/sinus, anemia, anxiety-new, arthritis/joint pain, blood in urine, breast changes/lumps, change in vision, confusion, cough, coughing up blood, depression-new, fainting, fever, headaches-new, hearing problems, heart murmur, heart rhythm changes, itching, muscle pains/cramps, night sweats, nosebleeds, skin rash, sore throat, swelling of feet/legs, swollen lymph glands, thirst - excessive, urination - excessive, urination changes/pain, urine leakage, vision changes, and voice change.    Vital Signs:  Patient profile:   60 year old male Height:      68 inches Weight:      245 pounds BMI:     37.39 BSA:     2.23 Pulse rate:   62 / minute Pulse rhythm:   regular BP sitting:   126 / 80  (left arm) Cuff size:   regular  Vitals Entered By: Ok Anis CMA (July 01, 2009 8:20 AM)  Physical Exam  General:  Well developed, well nourished, no acute distress.healthy appearing.   Head:  Normocephalic and atraumatic. Eyes:  PERRLA, no icterus.exam deferred to patient's ophthalmologist.   Abdomen:  Soft, nontender and nondistended. No masses, hepatosplenomegaly or hernias noted. Normal bowel sounds.obese.   Psych:  Alert and cooperative. Normal mood and affect.   Impression & Recommendations:  Problem # 1:  ABDOMINAL PAIN, GENERALIZED (ICD-789.07) Assessment Improved Trial of probiotic therapy. Patient could not afford Xifaxan treatment. 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  Problem # 3:  CHEST PAIN (ICD-786.50) Assessment: Improved non-cardiac chest pain consistent with functional etiology with negative GI and cardiac workup.He Apparently is under the care of neurosurgery for cervical spondylosis.  Problem # 4:  ESSENTIAL HYPERTENSION (ICD-401.9) Assessment: Improved continue medications per cardiology blood pressure today is 126/80.  Patient Instructions: 1)  Copy sent to : Dr. Charlton Haws and Dr. Laurita Quint 2)  Please continue current medications.  3)  Colonoscopy and Flexible Sigmoidoscopy brochure given.  4)  Conscious Sedation brochure given.  5)  Trial Probiotic Therapy. 6)  Excessive Gas Diet handout given.  7)  The medication list was reviewed and reconciled.  All changed / newly prescribed medications were explained.  A complete medication list was provided to the  patient / caregiver.  Appended Document: 2 WEEK F/U..AM.    Clinical Lists Changes  Problems: Added new problem of ANEMIA, IRON DEFICIENCY (ICD-280.9) Medications: Added new medication of MOVIPREP 100 GM  SOLR (PEG-KCL-NACL-NASULF-NA ASC-C) As per prep instructions. Added new medication of PHILLIPS COLON HEALTH  CAPS (PROBIOTIC PRODUCT) two times a day Orders: Added new Test order of Colonoscopy (Colon) - Signed

## 2010-05-23 NOTE — Progress Notes (Signed)
Summary: Triage   Phone Note Call from Patient Call back at Home Phone [phone]   Caller: Patient Call For: Dr. Jarold Motto Reason for Call: Talk to Nurse Summary of Call: Requesting to speak directly with nurse about the issues she is having Initial call taken by: Karna Christmas,  January 13, 2010 11:24 AM  Follow-up for Phone Call        Says  he hasdiscomfort below his private parts ref. back to PCP who he saw yesterday.Has discomfort after he eats says he is advoiding greasy foods.Is not using his Librax says he threw bottle away because he thought he  had no refills. Advisedhe should call for refills as he was given 3. Follow-up by: Teryl Lucy RN,  January 13, 2010 11:55 AM

## 2010-05-23 NOTE — Assessment & Plan Note (Signed)
Summary: 3 MONTH FOLLOW UP PER DR SCHALLER/[facility]   Vital Signs:  Patient profile:   60 year old male Weight:      239.75 pounds Temp:     98.2 degrees F oral Pulse rate:   74 / minute Pulse rhythm:   regular BP sitting:   128 / 98  (left arm) Cuff size:   large  Vitals Entered By: Selena Batten Dance CMA (AAMA) (December 05, 2009 8:15 AM) CC: 3 month follow up   History of Present Illness: Neck pain continues, has been seen by Dr. Shon Baton and has follow up MRI pending.  Worse with position change and interrupts sleep.  Occ radiates down R arm.    EGD recently done with Dr Jarold Motto and was on treatment for H pylori, done with meds now.    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[employer], Solicitor in a store, 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[name]), 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 Never Smoked Alcohol use-yes,occasionally Drug use-yes, H/O marijuana, last 2005, Cocaine 5/04 Living with stepfather who has cancer  Past Medical History: Current Problems:  CHEST PAIN (ICD-786.50) GERD (ICD-530.81) HAND PAIN, RIGHT LONG FINGER AND MIDPALM (ICD-729.5) CALCIFIC TENDINITIS (ICD-727.82) HYPERCHOLESTEROLEMIA, PURE (ICD-272.0) ESSENTIAL HYPERTENSION (ICD-401.9) DYSPNEA (ICD-786.05) FOOT PAIN, RIGHT (ICD-729.5) ESOPHAGEAL STRICTURE (ICD-530.3) MUSCLE SPASM (ICD-728.85) ANXIETY DEPRESSION (ICD-300.4) TIA  (ICD-435.9) EPIDIDYMITIS (ICD-604.90) BACK PAIN WITH RADICULOPATHY (ICD-729.2) 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 of SLEEP APNEA (ICD-780.57) SINUSITIS (ICD-473.9)  Nonobstructive coronary disease on cardiac cath on December 31, 2007.   Past Surgical History: Childhood      Tonsillectomy.                       R Carpal tunnel release.  5/04              Cath - normal 5/1-08/25/02     [facility] Chest Pain  R/O'd 11/2003          R/O at ER 12/12/05        Trigger finger release, right long finger 04/25/06           Stress MUGA normal, EF 60% 11/22/06           Bilat Inguinal Hernia Repair, 5cm RLQAbd Lipoma Excision  (Dr Ezzard Standing) 03/17/07       Adenosine Myoview  low risk  07/17/07         EGD Esoph Stricture Dilated (Dr Marina Goodell) 10/28/08           Anterior cervical diskectomy and fusion (Dr Shon Baton) 10/29/08           Hematoma Evacuation (Dr Shon Baton) 11/02/08         Direct Laryngosocopy, Extubation under Anesthesia (Dr Lazarus Salines) 07/25/09           Colonoscopy  Divertics/Sigm  Poor Prep    High Fiber Diet   (Dr  Jarold Motto)       5 years  Social History: Reviewed history from 05/06/2008 and no changes required. 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  Otherwise negative.    Physical Exam  General:  GEN: nad, alert and oriented HEENT: mucous membranes moist NECK: supple w/o LA CV: rrr. PULM: ctab, no inc wob, pain along R ant ribs with forward flexion,  relieved with compression and after sitting back in the chair (not new per patient) ABD: soft, +bs EXT: no edema SKIN: no acute rash   Diabetes Management Exam:    Foot Exam (with socks and/or shoes not present):       Sensory-Pinprick/Light touch:          Left medial foot (L-4): diminished          Left dorsal foot (L-5): diminished          Left lateral foot (S-1): diminished          Right medial foot (L-4): diminished          Right dorsal foot (L-5): diminished          Right lateral foot (S-1): diminished       Sensory-Monofilament:          Left foot: diminished          Right foot: diminished       Inspection:          Left foot: normal          Right foot: normal       Nails:          Left foot: normal          Right foot: normal   Impression & Recommendations:  Problem # 1:  DIABETES MELLITUS, TYPE II (ICD-250.00) D/w patient re: diet and exercise.  Has been working to increase fruit but hasn't been getting much exercise.  Would gradually inc exercise level.  >25 min spent with patient, at least half of which was spent on counseling re:DM2.  See meds change below and recheck A1c in 3 months.  Labs reviewed wtih aptient.  His updated medication list for this problem includes:    Glipizide-metformin Hcl 5-500 Mg Tabs (Glipizide-metformin hcl) .Marland Kitchen... 2  tab by mouth in am and 2 tabs in pm.  Problem # 2:  COSTOCHONDRITIS (ICD-733.6) Pt to follow up with ortho ZO:XWRU and back pain.  This may be related.  This is not a new condition and the exam is reassuring for noncardiac/nonpulmonary source.   Complete Medication List: 1)  Glipizide-metformin Hcl 5-500 Mg Tabs (Glipizide-metformin hcl) .... 2  tab by mouth in am and 2 tabs in pm. 2)  Metoprolol Succinate 50 Mg Tb24 (Metoprolol succinate) .Marland Kitchen.. 1 daily by mouth 3)  Onetouch Ultra Test Strp (Glucose blood) .... Check blood sugar as directed 4)  Tramadol Hcl 50 Mg Tabs (Tramadol hcl) .Marland Kitchen.. 1 by mouth every 4-6 hrs as needed  pain 5)  Clidinium-chlordiazepoxide 2.5-5 Mg Caps (Clidinium-chlordiazepoxide) .Marland Kitchen.. 1 by mouth three times a day as needed 6)  Carafate 1 Gm/42ml Susp (Sucralfate) .Marland KitchenMarland KitchenMarland Kitchen  1 tbsp qid as needed 7)  Omeprazole 40 Mg Cpdr (Omeprazole) .... One tablet by mouth once daily 8)  Lidocaine Viscous 2 % Soln (Lidocaine hcl) .Marland Kitchen.. 15 ml q 4 hrs as needed  Patient Instructions: 1)  Change your meds:    Glipizide-metformin Hcl 5-500 Mg Tabs (Glipizide-metformin hcl) .Marland Kitchen... 2  tab by mouth in am and 2 tabs in pm. 2)  Recheck fasting labs in 3 months with OV a few days later. 3)  cmet/lipid/A1c/MALB 250.00 4)  Get arch supports for your shoes and check your feet daily to make sure your don't have any skin breakdown or ulcers.   Current Allergies (reviewed today): No known allergies

## 2010-05-23 NOTE — Assessment & Plan Note (Signed)
Summary: PERSONAL ISSUES/DLO   Vital Signs:  Patient profile:   60 year old male Weight:      254 pounds Temp:     98.4 degrees F oral Pulse rate:   72 / minute Pulse rhythm:   regular BP sitting:   140 / 96  (left arm) Cuff size:   large  Vitals Entered By: Sydell Axon LPN (June 15, 2009 9:56 AM) CC: Personal issues, has stopped a lot of his medication because he does not have insurance   History of Present Illness: Pt here for personal issues. he is having problems with headaches and sleeping at night...he averages five hrs of sleep all day. He complains of sleep.  He "wants to get everything under control." They have been going on about two weeks. He had neck surgery by Dr Shon Baton on his neck and Dr Shon Baton has sent him to pain mgt.  He now complains of SOB with walking...he wheezes some, he has chest pain, some . nausea, he has sweating  with this. He is going to see Dr Eden Emms.  He does not check his sugar regularly...did this 2 days ago and was 156.  He has been out of Metoprolol for 3-4 days...he still has refills.  Problems Prior to Update: 1)  Costochondritis  (ICD-733.6) 2)  Chest Pain  (ICD-786.50) 3)  Chest Pain  (ICD-786.50) 4)  Gerd  (ICD-530.81) 5)  Hand Pain, Right Long Finger and Midpalm  (ICD-729.5) 6)  Calcific Tendinitis  (ICD-727.82) 7)  Hypercholesterolemia, Pure  (ICD-272.0) 8)  Essential Hypertension  (ICD-401.9) 9)  Dyspnea  (ICD-786.05) 10)  Foot Pain, Right  (ICD-729.5) 11)  Esophageal Stricture  (ICD-530.3) 12)  Muscle Spasm  (ICD-728.85) 13)  Anxiety Depression  (ICD-300.4) 14)  Tia  (ICD-435.9) 15)  Epididymitis  (ICD-604.90) 16)  Back Pain With Radiculopathy  (ICD-729.2) 17)  Dsord Circadian Rhy Irreglr Sleep Phase Type  (ICD-327.33) 18)  Abdominal Wall Hernia  (ICD-553.20) 19)  Cocaine Abuse, Hx of  (ICD-V15.9) 20)  Diabetes Mellitus, Type II  (ICD-250.00) 21)  ? of Sleep Apnea  (ICD-780.57) 22)  Sinusitis  (ICD-473.9)  Medications  Prior to Update: 1)  Glipizide-Metformin Hcl 5-500 Mg  Tabs (Glipizide-Metformin Hcl) .Marland Kitchen.. 1  Tab By Mouth in Am and 4 Tabs in Pm... 2)  Simvastatin 40 Mg  Tabs (Simvastatin) .... One Tab By Mouth At Night 3)  Metoprolol Succinate 50 Mg  Tb24 (Metoprolol Succinate) .Marland Kitchen.. 1 Daily By Mouth 4)  Onetouch Ultra Test   Strp (Glucose Blood) .... Check Blood Sugar As Directed 5)  Gabapentin 300 Mg Caps (Gabapentin) .Marland Kitchen.. 1 Tablet Three Times A Day 6)  Clotrimazole 1 % Crea (Clotrimazole) .... Apply Two Times A Day (1 Large Tube - Covers Feet) 7)  Hydrocodone-Acetaminophen 5-500 Mg Tabs (Hydrocodone-Acetaminophen) .Marland Kitchen.. 1 By Mouth Q 6 Hours As Needed Pain 8)  Mobic 15 Mg Tabs (Meloxicam) .... One Tab By Mouth After Supper 9)  Flexeril 10 Mg Tabs (Cyclobenzaprine Hcl) .... One Tab By Mouth Three Times A Day  Allergies: No Known Drug Allergies  Physical Exam  General:  Well-developed,well-nourished,in no acute distress; alert,appropriate and cooperative throughout examination, comfortable. His chest discomfort and SOB he complains of go away when distracted by discussing other topics..  Head:  Normocephalic and atraumatic without obvious abnormalities. No apparent alopecia or balding. Eyes:  Conjunctiva clear bilaterally.  Ears:  no external deformities.  TMs nml with good LR and mild cerumen bilat. Nose:  no external deformity.  Mouth:  Oral mucosa and oropharynx without lesions or exudates.  Teeth in good repair. Neck:  No deformities, masses  noted. Tender in the right trapezial distribution. Lungs:  Normal respiratory effort, chest expands symmetrically. Lungs are clear to auscultation, no crackles or wheezes. No wheezing heard at all, air exchange reasonable Heart:  Normal rate and regular rhythm. S1 and S2 normal without gallop, murmur, click, rub or other extra sounds. Abdomen:  Bowel sounds positive,abdomen soft and non-tender without masses, organomegaly or hernias noted. Obese. Msk:   Verterbral column NT to direct palpation.   Impression & Recommendations:  Problem # 1:  COSTOCHONDRITIS (ICD-733.6) Assessment Unchanged Poss still part of his symptom complex. Has appt to see Dr Eden Emms. Would start there as he is diabetic with Htn, elevated chol and obesity, now under signif financial strain as well and has signif risk formherat disease. He does not look to have acute chest pain from ischemia while being seen here today requiring being seen acutely.  Problem # 2:  ESSENTIAL HYPERTENSION (ICD-401.9) Assessment: Deteriorated High topday but hasn't had his medicine in 3-4 days. Get [facility] to [facility], get his medication and start taking it regularly.  We also discussed trying to investigate pt assistance. His updated medication list for this problem includes:    Metoprolol Succinate 50 Mg Tb24 (Metoprolol succinate) .Marland Kitchen... 1 daily by mouth  BP today: 140/96 Prior BP: 130/80 (11/30/2008)  Prior 10 Yr Risk Heart Disease: 27 % (11/30/2008)  Labs Reviewed: K+: 3.8 (04/20/2008) Creat: : 1.1 (04/20/2008)   Chol: 189 (10/01/2006)   HDL: 33.0 (10/01/2006)   LDL: 124 (10/01/2006)   TG: 159 (10/01/2006)  Problem # 3:  ? of SLEEP APNEA (ICD-780.57) Assessment: Unchanged Pt still at great risk of sleep apnea but financially I se no way of investigating this at preasent. We discussed sleeop huygiene and establishing regular routine.   Complete Medication List: 1)  Glipizide-metformin Hcl 5-500 Mg Tabs (Glipizide-metformin hcl) .Marland Kitchen.. 1  tab by mouth in am and 4 tabs in pm... 2)  Metoprolol Succinate 50 Mg Tb24 (Metoprolol succinate) .Marland Kitchen.. 1 daily by mouth 3)  Onetouch Ultra Test Strp (Glucose blood) .... Check blood sugar as directed 4)  Clotrimazole 1 % Crea (Clotrimazole) .... Apply two times a day (1 large tube - covers feet)  Patient Instructions: 1)  Keep appt he has already scheduled. 2)  RTC when able top start check Glu regularly in AM to discuss further measures at sugar  control and risk reduction.  Current Allergies (reviewed today): No known allergies

## 2010-05-23 NOTE — Progress Notes (Signed)
Summary: phone note   Phone Note Call from Patient Call back at Home Phone [phone]   Caller: Patient Call For: Dr. Jarold Motto Reason for Call: Talk to Nurse Summary of Call: pt stressed over and over that he wants to speak directly to Dr. Jarold Motto, no one else... when asked what his mesg was regarding, pt said that was between him and Dr. Jarold Motto... asked pt again to narrow down what he was calling about and pt said that it had to do with "a part of his body" Initial call taken by: Vallarie Mare,  February 28, 2010 2:21 PM  Follow-up for Phone Call        Patient  won't tell me what he wants to speak with Dr Jarold Motto about.  "I want to talk to Dr Jarold Motto personally" Follow-up by: Darcey Nora RN, CGRN,  February 28, 2010 2:43 PM  Additional Follow-up for Phone Call Additional follow up Details #1::        I spoke with his primary care physician. I do not think he needs further GI evaluation. If he becomes abusive, we will discharge him from GI. Dr. Para March in primary care will prescribe his medications. Additional Follow-up by: Mardella Layman MD Alanda Slim 2:49 PM    Additional Follow-up for Phone Call Additional follow up Details #2::    Patient  advised to follow up with Dr Lianne Bushy office. Follow-up by: Darcey Nora RN, CGRN,  February 28, 2010 3:12 PM

## 2010-05-23 NOTE — Assessment & Plan Note (Signed)
Summary: ABD pain   Allergies: No Known Drug Allergies   Complete Medication List: 1)  Glipizide-metformin Hcl 5-500 Mg Tabs (Glipizide-metformin hcl) .... 2  tab by mouth in am and 2 tabs in pm. 2)  Metoprolol Succinate 50 Mg Tb24 (Metoprolol succinate) .Marland Kitchen.. 1 daily by mouth 3)  Onetouch Ultra Test Strp (Glucose blood) .... Check blood sugar as directed 4)  Tramadol Hcl 50 Mg Tabs (Tramadol hcl) .Marland Kitchen.. 1 by mouth every 4-6 hrs as needed pain 5)  Clidinium-chlordiazepoxide 2.5-5 Mg Caps (Clidinium-chlordiazepoxide) .Marland Kitchen.. 1 by mouth three times a day as needed 6)  Carafate 1 Gm/69ml Susp (Sucralfate) .Marland Kitchen.. 1 tbsp qid as needed 7)  Omeprazole 40 Mg Cpdr (Omeprazole) .... One tablet by mouth once daily 8)  Lidocaine Viscous 2 % Soln (Lidocaine hcl) .Marland Kitchen.. 15 ml q 4 hrs as needed

## 2010-05-23 NOTE — Procedures (Signed)
Summary: Colonoscopy  Patient: [name] Note: All result statuses are Final unless otherwise noted.  Tests: (1) Colonoscopy (COL)   COL Colonoscopy           DONE     [facility]     [address]     [place], [place]  [address]           COLONOSCOPY PROCEDURE REPORT           PATIENT:  Robert, [name]  MR#:  [id number]     BIRTHDATE:  October 23, 1950, 58 yrs. old  GENDER:  male     ENDOSCOPIST:  Vania Rea. Jarold Motto, MD, [facility]     REF. BY:     PROCEDURE DATE:  07/25/2009     PROCEDURE:  Average-risk screening colonoscopy     G0121     ASA CLASS:  Class II     INDICATIONS:  abdominal pain, constipation     MEDICATIONS:   Fentanyl 75 mcg IV, Versed 7 mg IV           DESCRIPTION OF PROCEDURE:   After the risks benefits and     alternatives of the procedure were thoroughly explained, informed     consent was obtained.  Digital rectal exam was performed and     revealed no abnormalities.   The LB [id number] endoscope     was introduced through the anus and advanced to the cecum, which     was identified by both the appendix and ileocecal valve, limited     by poor preparation.    The quality of the prep was poor, using     MiraLax.  The instrument was then slowly withdrawn as the colon     was fully examined.     <<PROCEDUREIMAGES>>           FINDINGS:  Scattered diverticula were found in the sigmoid colon.     No polyps or cancers were seen. solid stool in right conon.VERY     POOR PREP.LIMITED VISIBILITY.   Retroflexed views in the rectum     revealed no abnormalities.    The scope was then withdrawn from     the patient and the procedure completed.           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     RECOMMENDATIONS:     1) High fiber diet with liberal fluid intake.     2) continue current medications     3) Follow up  colonoscopy in 5 years     TRIAL OF [name] 20 GMS QHS.     REPEAT EXAM:  No           ______________________________     Vania Rea. Jarold Motto, MD, [facility]           CC:  Arta Silence, MD           n.     Rosalie Doctor:   Vania Rea. Patterson at 07/25/2009 12:33 PM           [place], Allyne Gee, [id number]  Note: An exclamation mark (!) indicates a result that was not dispersed into the flowsheet. Document Creation Date: 07/25/2009 12:34 PM _______________________________________________________________________  (1) Order result status: Final Collection or observation date-time: 07/25/2009 12:27 Requested date-time:  Receipt  date-time:  Reported date-time:  Referring Physician:   Ordering Physician: Sheryn Bison [phone]) Specimen Source:  Source: Launa Grill Order Number: [id number] Lab site:   Appended Document: Colonoscopy    Clinical Lists Changes  Observations: Added new observation of COLONNXTDUE: 07/2014 (07/25/2009 13:39)      Appended Document: Colonoscopy     Clinical Lists Changes  Observations: Added new observation of PAST SURG HX: Childhood      Tonsillectomy.                       R Carpal tunnel release.  5/04              Cath - normal 5/1-08/25/02     [facility] Chest Pain  R/O'd 11/2003          R/O at ER 12/12/05        Trigger finger release, right long finger 04/25/06           Stress MUGA normal, EF 60% 11/22/06           Bilat Inguinal Hernia Repair, 5cm RLQAbd Lipoma Excision  (Dr Ezzard Standing) 03/17/07       Adenosine Myoview  low risk  07/17/07         EGD Esoph Stricture Dilated (Dr Marina Goodell) 10/28/08           Anterior cervical diskectomy and fusion (Dr Shon Baton) 10/29/08           Hematoma Evacuation (Dr Shon Baton) 11/02/08         Direct Laryngosocopy, Extubation under Anesthesia (Dr Lazarus Salines) 07/25/09           Colonoscopy  Divertics/Sigm  Poor Prep    High Fiber Diet   (Dr Jarold Motto)       5 years       (07/25/2009 16:48)       Past Surgical History:     Childhood      Tonsillectomy.                          R Carpal tunnel release.     5/04              Cath - normal    5/1-08/25/02     [facility] Chest Pain  R/O'd    11/2003          R/O at ER    12/12/05        Trigger finger release, right long finger    04/25/06           Stress MUGA normal, EF 60%    11/22/06           Bilat Inguinal Hernia Repair, 5cm RLQAbd Lipoma Excision  (Dr Ezzard Standing)    03/17/07       Adenosine Myoview  low risk     07/17/07         EGD Esoph Stricture Dilated (Dr Marina Goodell)    10/28/08           Anterior cervical diskectomy and fusion (Dr Shon Baton)    10/29/08           Hematoma Evacuation (Dr Shon Baton)    11/02/08         Direct Laryngosocopy, Extubation under Anesthesia (Dr Lazarus Salines)    07/25/09           Colonoscopy  Divertics/Sigm  Poor Prep    High Fiber Diet   (Dr Jarold Motto)  5 years

## 2010-05-23 NOTE — Miscellaneous (Signed)
Summary: librax script  Clinical Lists Changes  Medications: Added new medication of LIBRAX 2.5-5 MG  CAPS (CLIDINIUM-CHLORDIAZEPOXIDE) one by mouth three times a day after meals - Signed Rx of LIBRAX 2.5-5 MG  CAPS (CLIDINIUM-CHLORDIAZEPOXIDE) one by mouth three times a day after meals;  #100 x 3;  Signed;  Entered by: Joylene John RN;  Authorized by: Mardella Layman MD [facility];  Method used: Electronically to [facility] [address], [place], [place]  [address], Ph: [phone], Fax: [phone]    Prescriptions: LIBRAX 2.5-5 MG  CAPS (CLIDINIUM-CHLORDIAZEPOXIDE) one by mouth three times a day after meals  #100 x 3   Entered by:   Joylene John RN   Authorized by:   Mardella Layman MD [facility]   Signed by:   Joylene John RN on 11/09/2009   Method used:   Electronically to        [facility]* (retail)       [address]       [place], [place]  [address]       Ph: [phone]       Fax: [phone]   RxID:   [id number]

## 2010-05-23 NOTE — Letter (Signed)
Summary: Hospital doctor Letter   Imported By: Roderic Ovens 06/16/2009 14:20:14  _____________________________________________________________________  External Attachment:    Type:   Image     Comment:   External Document

## 2010-05-23 NOTE — Progress Notes (Signed)
Summary: chest pain and SOB   Phone Note Call from Patient Call back at [phone]   Caller: Patient Call For: Shaune Leeks MD Summary of Call: Last Fri, SAt and Sun pt had consistently a dull pain in left side of chest with SOB, stinging pain going into left arm, sweats and nausea. On Mon 05/16/09 pain was better and pt has had no pain or other symptoms today. Pt has a letter from [facility] that he received today that he can come to the dr.  that any old balance pt had had been taken care of. Pt wants appt to see Dr. Hetty Ely. Please advise.  Initial call taken by: Lewanda Rife LPN,  May 17, 2009 4:16 PM  Follow-up for Phone Call        Dr Hetty Ely said with those symptoms he would suggest pt going to ER or calliing pt's cardiologist. Patient notified as instructed by telephone. Lewanda Rife LPN  May 17, 2009 4:23 PM

## 2010-05-25 NOTE — Progress Notes (Signed)
Summary: Back / hip pain  Phone Note Outgoing Call Call back at [facility] Phone [phone]   Call placed by: Delilah Shan CMA Duncan Dull),  April 06, 2010 3:45 PM Call placed to: Patient Summary of Call: I phoned the patient to give the message that Dr. Para March wrote on his records review.  It stated:   Please tell the patient that I have reviewed the notes and I can't explain his pain.  I would advise him to follow up with Dr. Ethelene Hal about the lower back/hip area pain. Patient was advised. The patient stated to me that he explained his low back/hip pain to Dr. Para March at his last OV and that Dr. Para March said his "kidney was bleeding" but not bad enough to do anything about.  He went to his Cardiologist a few days after that and told him about his back and hip pain and before he got home, the Cardiologist had called in a medication that has totally alleviated his pain and it is supposed to protect his kidneys.  He says he felt that should have been done by the PCP and not the Cardiologist.  He says he will take this up with you at his next OV.  Initial call taken by: Delilah Shan CMA Duncan Dull),  April 06, 2010 3:53 PM  Follow-up for Phone Call        I never said his "kidney was bleeding."  I am glad he is feeling better.   Follow-up by: Crawford Givens MD,  April 06, 2010 3:59 PM

## 2010-05-25 NOTE — Assessment & Plan Note (Signed)
Summary: BREAKING OUT ON FEET,PT DIABETIC/RI   Vital Signs:  Patient profile:   60 year old male Height:      68 inches Weight:      243.75 pounds BMI:     37.20 Temp:     98 degrees F oral Pulse rate:   84 / minute Pulse rhythm:   regular BP sitting:   130 / 80  (left arm) Cuff size:   large  Vitals Entered By: Delilah Shan CMA Clemence Lengyel Dull) (May 04, 2010 3:23 PM) CC: Breaking out on feet - (diabetic)   History of Present Illness: bilateral foot rash.  itchy, present for weeks.    back pain- resolved.  Pt had been saying the following: "You said I was bleeding from my kidneys.  My heart doctor put me on medicine [lisinopril] and it fixed it and my back pain."  see plan.   Diabetes: has been working on diet and has had some episodes of low sugar.  He felt like his sugar dropped today in clinic.  We gave him a snack and drink.  Sx (sweaty, hot) resolved almost immediately.  We checked sugar (he had already eaten the snack) and sugar was 158.  No symptoms at that point.  See plan.    Allergies: No Known Drug Allergies  Review of Systems       See HPI.  Otherwise negative.    Physical Exam  General:  no apparent distress after presumed low sugar resolved.  bilateral feet with flaky rash c/w superficial fungal infection.  No ulceration.   Diabetes Management Exam:    Foot Exam (with socks and/or shoes not present):       Sensory-Pinprick/Light touch:          Left medial foot (L-4): normal          Left dorsal foot (L-5): normal          Left lateral foot (S-1): normal          Right medial foot (L-4): normal          Right dorsal foot (L-5): normal          Right lateral foot (S-1): normal       Sensory-Monofilament:          Left foot: diminished          Right foot: diminished       Sensory-other: mild decrease in monofilament testing on bottom of both feet       Inspection:          Left foot: abnormal             Comments: see gen exam          Right foot:  abnormal             Comments: see gen exam       Nails:          Left foot: normal          Right foot: normal   Impression & Recommendations:  Problem # 1:  DIABETES MELLITUS, TYPE II (ICD-250.00) Cut dose of met/glipizide to 1 two times a day and call back with update on numbers/symptoms.  He agrees.  Continue to work on diet. We can decide about next A1c when he calls back.   His updated medication list for this problem includes:    Glipizide-metformin Hcl 5-500 Mg Tabs (Glipizide-metformin hcl) .Marland Kitchen... 1  tab by mouth in am and 1  tab in pm.    Lisinopril 10 Mg Tabs (Lisinopril) .Marland Kitchen... 1 once daily  Problem # 2:  SKIN RASH (ICD-782.1) Likely athlete's foot.  I would use otc meds.  He agrees.  Call back as needed. No ulceration.    Problem # 3:  BACK PAIN WITH RADICULOPATHY (ICD-729.2) Assessment: Improved I told the patient the following- "I absolutely never told you that you were bleeding from your kidney."  I told him again about his MALB results and that he had begun to leak protein.  the initial tx is control of DM2  and then add on ACE.  ACE was added by cards.  I have no idea why his back pain is better, but I am happy for him.  I told him this.  Just because two things happen around the same time, that in no way proves causality.  I would continue the ACE since he appears to be tolerating it.  He understood.    Complete Medication List: 1)  Glipizide-metformin Hcl 5-500 Mg Tabs (Glipizide-metformin hcl) .Marland Kitchen.. 1  tab by mouth in am and 1 tab in pm. 2)  Metoprolol Succinate 50 Mg Tb24 (Metoprolol succinate) .Marland Kitchen.. 1 daily by mouth 3)  Onetouch Ultra Test Strp (Glucose blood) .... Check blood sugar as directed 4)  Tramadol Hcl 50 Mg Tabs (Tramadol hcl) .Marland Kitchen.. 1 by mouth every 4-6 hrs as needed pain 5)  Clidinium-chlordiazepoxide 2.5-5 Mg Caps (Clidinium-chlordiazepoxide) .Marland Kitchen.. 1 by mouth three times a day as needed 6)  Carafate 1 Gm/62ml Susp (Sucralfate) .Marland Kitchen.. 1 tbsp qid as needed 7)   Lisinopril 10 Mg Tabs (Lisinopril) .Marland Kitchen.. 1 once daily  Other Orders: Glucose, (CBG) (16109)  Patient Instructions: 1)  Dec your sugar medicine to 1 by mouth two times a day and let me know about your fasting sugars by the end of the month.  If your sugar continues to drop, let me know.  Keep a snack handy in case your sugar drops.  We can decide about your next lab visit after I get the numbers.  2)  Don't change your meds otherwise. 3)  I would get a cream or spray for athlete's foot and use it as directed.  If it isn't helping, you may need to change to a different cream/spray.  4)  Take care.    Orders Added: 1)  Glucose, (CBG) [82962] 2)  Est. Patient Level III [60454]    Current Allergies (reviewed today): No known allergies   Laboratory Results   Blood Tests   Date/Time Received: May 04, 2010 3:36 PM  Date/Time Reported: May 04, 2010 3:36 PM   Glucose (random): 158 mg/dL   (Normal Range: 09-811)

## 2010-05-25 NOTE — Letter (Signed)
Summary: 02/16/09-02/09/10 [facility]  02/16/09-02/09/10 [facility]   Imported By: Lester [place] 04/14/2010 10:48:28  _____________________________________________________________________  External Attachment:    Type:   Image     Comment:   External Document

## 2010-05-25 NOTE — Op Note (Signed)
Summary: Epidural/Richard Ramos MD  Epidural/Richard Ramos MD   Imported By: Lester Jobos 04/14/2010 10:38:08  _____________________________________________________________________  External Attachment:    Type:   Image     Comment:   External Document

## 2010-05-25 NOTE — Op Note (Signed)
Summary: Epidural/Richard Ramos MD  Epidural/Richard Ramos MD   Imported By: Lester [place] 04/14/2010 10:37:04  _____________________________________________________________________  External Attachment:    Type:   Image     Comment:   External Document

## 2010-05-25 NOTE — Progress Notes (Signed)
Summary: triage   Phone Note Call from Patient Call back at Home Phone [phone]   Caller: Patient Call For: Dr Jarold Motto Reason for Call: Talk to Nurse Summary of Call: Patient would like to speak to nurse regarding lower abd pain he's been experiencing. Initial call taken by: Tawni Levy,  April 18, 2010 1:30 PM  Follow-up for Phone Call        Patient states he has been having some discomfort in his lower abdomen. States that he has not had a bowel movement in 3-4 days. Instructed patient to get some Miralax and start taking 17gms, patient to call us back if no improvement. Follow-up by: Selinda Michaels RN,  April 18, 2010 2:35 PM

## 2010-05-25 NOTE — Progress Notes (Signed)
Summary: Breaking out (black spots) both feet  Phone Note Call from Patient   Caller: Patient Call For: Dr Para March Summary of Call: Pt has breakinig out on feet that look like dark spots which itch and some places are breaking open. Pt is diabetic. Pt scheduled appt to see Dr Para March today at 3:15pm. Pt was not available before 3pm. Initial call taken by: Lewanda Rife LPN,  May 04, 2010 8:26 AM    D

## 2010-06-08 NOTE — Letter (Signed)
Summary: Blood Glucose Values  Blood Glucose Values   Imported By: Kassie Mends 05/31/2010 08:43:30  _____________________________________________________________________  External Attachment:    Type:   Image     Comment:   External Document

## 2010-06-28 ENCOUNTER — Encounter: Payer: Self-pay | Admitting: *Deleted

## 2010-06-28 ENCOUNTER — Other Ambulatory Visit: Payer: Self-pay

## 2010-07-03 ENCOUNTER — Ambulatory Visit: Payer: Self-pay | Admitting: Family Medicine

## 2010-07-12 LAB — POCT I-STAT, CHEM 8
BUN: 8 mg/dL (ref 6–23)
Chloride: 107 mEq/L (ref 96–112)
HCT: 42 % (ref 39.0–52.0)
Potassium: 3.5 mEq/L (ref 3.5–5.1)
Sodium: 142 mEq/L (ref 135–145)

## 2010-07-12 LAB — GLUCOSE, CAPILLARY
Glucose-Capillary: 152 mg/dL — ABNORMAL HIGH (ref 70–99)
Glucose-Capillary: 168 mg/dL — ABNORMAL HIGH (ref 70–99)

## 2010-07-12 LAB — POCT CARDIAC MARKERS
CKMB, poc: <1 ng/mL — ABNORMAL LOW (ref 1.0–8.0)
Myoglobin, poc: 68.9 ng/mL (ref 12–200)
Troponin i, poc: <0.05 ng/mL (ref 0.00–0.09)

## 2010-07-12 LAB — DIFFERENTIAL
Eosinophils Relative: 2 % (ref 0–5)
Lymphocytes Relative: 23 % (ref 12–46)
Lymphs Abs: 1.5 10*3/uL (ref 0.7–4.0)
Neutro Abs: 4.2 10*3/uL (ref 1.7–7.7)
Neutrophils Relative %: 66 % (ref 43–77)

## 2010-07-12 LAB — CBC
HCT: 39.2 % (ref 39.0–52.0)
Platelets: 263 10*3/uL (ref 150–400)
WBC: 6.4 10*3/uL (ref 4.0–10.5)

## 2010-07-12 LAB — SEDIMENTATION RATE: Sed Rate: 17 mm/hr — ABNORMAL HIGH (ref 0–16)

## 2010-07-30 LAB — BASIC METABOLIC PANEL
BUN: 6 mg/dL (ref 6–23)
BUN: 8 mg/dL (ref 6–23)
BUN: 9 mg/dL (ref 6–23)
CO2: 26 mEq/L (ref 19–32)
CO2: 26 mEq/L (ref 19–32)
CO2: 28 mEq/L (ref 19–32)
CO2: 28 mEq/L (ref 19–32)
CO2: 29 mEq/L (ref 19–32)
Calcium: 8.1 mg/dL — ABNORMAL LOW (ref 8.4–10.5)
Calcium: 8.1 mg/dL — ABNORMAL LOW (ref 8.4–10.5)
Calcium: 8.7 mg/dL (ref 8.4–10.5)
Calcium: 8.8 mg/dL (ref 8.4–10.5)
Chloride: 100 mEq/L (ref 96–112)
Chloride: 104 mEq/L (ref 96–112)
Chloride: 105 mEq/L (ref 96–112)
Chloride: 105 mEq/L (ref 96–112)
Chloride: 108 mEq/L (ref 96–112)
Creatinine, Ser: 0.77 mg/dL (ref 0.4–1.5)
Creatinine, Ser: 0.81 mg/dL (ref 0.4–1.5)
Creatinine, Ser: 0.84 mg/dL (ref 0.4–1.5)
Creatinine, Ser: 0.96 mg/dL (ref 0.4–1.5)
GFR calc Af Amer: >60 mL/min (ref >60)
Glucose, Bld: 144 mg/dL — ABNORMAL HIGH (ref 70–99)
Glucose, Bld: 159 mg/dL — ABNORMAL HIGH (ref 70–99)
Glucose, Bld: 188 mg/dL — ABNORMAL HIGH (ref 70–99)
Glucose, Bld: 209 mg/dL — ABNORMAL HIGH (ref 70–99)
Potassium: 2.6 mEq/L — CL (ref 3.5–5.1)
Potassium: 3.6 mEq/L (ref 3.5–5.1)
Potassium: 3.7 mEq/L (ref 3.5–5.1)
Sodium: 140 mEq/L (ref 135–145)
Sodium: 143 mEq/L (ref 135–145)

## 2010-07-30 LAB — GLUCOSE, CAPILLARY
Glucose-Capillary: 123 mg/dL — ABNORMAL HIGH (ref 70–99)
Glucose-Capillary: 126 mg/dL — ABNORMAL HIGH (ref 70–99)
Glucose-Capillary: 135 mg/dL — ABNORMAL HIGH (ref 70–99)
Glucose-Capillary: 137 mg/dL — ABNORMAL HIGH (ref 70–99)
Glucose-Capillary: 147 mg/dL — ABNORMAL HIGH (ref 70–99)
Glucose-Capillary: 151 mg/dL — ABNORMAL HIGH (ref 70–99)
Glucose-Capillary: 154 mg/dL — ABNORMAL HIGH (ref 70–99)
Glucose-Capillary: 156 mg/dL — ABNORMAL HIGH (ref 70–99)
Glucose-Capillary: 157 mg/dL — ABNORMAL HIGH (ref 70–99)
Glucose-Capillary: 163 mg/dL — ABNORMAL HIGH (ref 70–99)
Glucose-Capillary: 164 mg/dL — ABNORMAL HIGH (ref 70–99)
Glucose-Capillary: 170 mg/dL — ABNORMAL HIGH (ref 70–99)
Glucose-Capillary: 171 mg/dL — ABNORMAL HIGH (ref 70–99)
Glucose-Capillary: 182 mg/dL — ABNORMAL HIGH (ref 70–99)
Glucose-Capillary: 186 mg/dL — ABNORMAL HIGH (ref 70–99)
Glucose-Capillary: 190 mg/dL — ABNORMAL HIGH (ref 70–99)
Glucose-Capillary: 192 mg/dL — ABNORMAL HIGH (ref 70–99)
Glucose-Capillary: 232 mg/dL — ABNORMAL HIGH (ref 70–99)
Glucose-Capillary: 241 mg/dL — ABNORMAL HIGH (ref 70–99)

## 2010-07-30 LAB — CBC
HCT: 33.9 % — ABNORMAL LOW (ref 39.0–52.0)
HCT: 35.4 % — ABNORMAL LOW (ref 39.0–52.0)
HCT: 36.6 % — ABNORMAL LOW (ref 39.0–52.0)
Hemoglobin: 11.5 g/dL — ABNORMAL LOW (ref 13.0–17.0)
Hemoglobin: 11.7 g/dL — ABNORMAL LOW (ref 13.0–17.0)
Hemoglobin: 12 g/dL — ABNORMAL LOW (ref 13.0–17.0)
MCHC: 33 g/dL (ref 30.0–36.0)
MCHC: 34 g/dL (ref 30.0–36.0)
MCHC: 34.6 g/dL (ref 30.0–36.0)
MCV: 92.3 fL (ref 78.0–100.0)
MCV: 92.8 fL (ref 78.0–100.0)
Platelets: 196 10*3/uL (ref 150–400)
RBC: 3.66 MIL/uL — ABNORMAL LOW (ref 4.22–5.81)
RBC: 3.75 MIL/uL — ABNORMAL LOW (ref 4.22–5.81)
RBC: 4.68 MIL/uL (ref 4.22–5.81)
RDW: 13.8 % (ref 11.5–15.5)
RDW: 14.1 % (ref 11.5–15.5)
RDW: 14.5 % (ref 11.5–15.5)
RDW: 14.7 % (ref 11.5–15.5)
WBC: 13.4 10*3/uL — ABNORMAL HIGH (ref 4.0–10.5)
WBC: 9.3 10*3/uL (ref 4.0–10.5)

## 2010-07-30 LAB — CK: Total CK: 4038 U/L — ABNORMAL HIGH (ref 7–232)

## 2010-08-03 LAB — CBC
HCT: 41 % (ref 39.0–52.0)
HCT: 41.4 % (ref 39.0–52.0)
Hemoglobin: 14 g/dL (ref 13.0–17.0)
Hemoglobin: 14.1 g/dL (ref 13.0–17.0)
MCHC: 34 g/dL (ref 30.0–36.0)
MCV: 93 fL (ref 78.0–100.0)
MCV: 93.2 fL (ref 78.0–100.0)
RBC: 4.46 MIL/uL (ref 4.22–5.81)
RDW: 15 % (ref 11.5–15.5)
RDW: 15 % (ref 11.5–15.5)

## 2010-08-03 LAB — BASIC METABOLIC PANEL
BUN: 16 mg/dL (ref 6–23)
CO2: 22 mEq/L (ref 19–32)
CO2: 26 mEq/L (ref 19–32)
Chloride: 101 mEq/L (ref 96–112)
Chloride: 105 mEq/L (ref 96–112)
GFR calc Af Amer: >60 mL/min (ref >60)
GFR calc non Af Amer: >60 mL/min (ref >60)
Glucose, Bld: 132 mg/dL — ABNORMAL HIGH (ref 70–99)
Glucose, Bld: 174 mg/dL — ABNORMAL HIGH (ref 70–99)
Potassium: 4.3 mEq/L (ref 3.5–5.1)
Sodium: 132 mEq/L — ABNORMAL LOW (ref 135–145)
Sodium: 139 mEq/L (ref 135–145)

## 2010-08-03 LAB — GLUCOSE, CAPILLARY: Glucose-Capillary: 167 mg/dL — ABNORMAL HIGH (ref 70–99)

## 2010-08-03 LAB — TYPE AND SCREEN

## 2010-08-17 ENCOUNTER — Other Ambulatory Visit: Payer: Self-pay | Admitting: Family Medicine

## 2010-08-22 ENCOUNTER — Other Ambulatory Visit: Payer: Self-pay

## 2010-08-24 ENCOUNTER — Ambulatory Visit: Payer: Self-pay | Admitting: Family Medicine

## 2010-09-05 NOTE — H&P (Signed)
[name], [name]             ACCOUNT NO.:  [id number]   MEDICAL RECORD NO.:  [id number]          PATIENT TYPE:  AMB   LOCATION:  DAY                          FACILITY:  [facility]   PHYSICIAN:  [name], M.D.  DATE OF BIRTH:  [birth year]   DATE OF ADMISSION:  11/22/2006  DATE OF DISCHARGE:                              HISTORY & PHYSICAL   HISTORY OF ILLNESS:  This is a 60 year old black male, who sees Dr.  Laurita [name] as his primary medical doctor, who was being evaluated  by Dr. Sheryn [name] for lower abdominal pain.  He has also had a  history of rectal bleeding, had an initial colonoscopy and polypectomy  17 years ago by [name].  Colonoscopies since then have revealed  diverticulosis and internal hemorrhoids, but no significant mass or  lesion.   Because of lower abdominal pain, [name] obtained a CT scan.  This  CT scan, performed October 02, 2006, shows small bilateral inguinal  hernias, containing fat and it was thought that this abdominal pain may  be due as much to the hernias as anything else.  [name] then  asked [place] to see him in consultation.   PAST MEDICAL HISTORY:  He has no allergies.   CURRENT MEDICATIONS INCLUDE:  1. Actos for noninsulin-dependent diabetes mellitus.  2. He then takes p.r.n. Darvocet for pain.   REVIEW OF SYSTEMS:  NEUROLOGIC:  No seizure or loss of consciousness.  CARDIAC:  He underwent a cardiac evaluation with stress test by Dr. Daleen [name]  in January of 2008, but this was reported as negative.  PULMONARY:  No history of pneumonia or tuberculosis.  GASTROINTESTINAL:  No history of peptic ulcer disease, liver disease,  pancreatic disease.  See History of Present Illness for remainder of  gastrointestinal problems.  UROLOGIC:  No history of kidney stones or kidney infections.  He does  have a remote history of drug abuse, but denies any problems at this  time.  He has the known diverticulosis and internal  hemorrhoids.   SOCIAL HISTORY:  He works at [employer] in [place], does  not smoke.   PHYSICAL EXAM:  VITAL SIGNS:  His weight is approximately 240 pounds,  his pulse 88, blood pressure of 126/80.  GENERAL:  He is a somewhat stocky black male, alert and cooperative on  physical exam.  HEENT:  Unremarkable.  NECK:  Supple, without mass or thyromegaly.  LUNGS:  Clear to auscultation with symmetric breath sounds.  HEART:  Has a regular rate and rhythm without murmur or rub.  ABDOMEN:  In his right lower abdomen, he has a bulge, which feels more  like a lipoma than a hernia.  This is about 3-4 cm in diameter.  He also  has, I think, a lipoma of his right forearm.  In the [name] position,  I think he has a weakness in both groins with probably pain a little bit  out of proportion to the hernia defect that I feel.  EXTREMITIES:  He has good strength in all four extremities.  NEUROLOGICALLY:  Grossly intact.   Again,  reviewing different data, I have notes from [name]  office.  I have the CT scan report from June 11 performed and read by  [name] with small bilateral inguinal hernias.   IMPRESSION:  1. Bilateral inguinal hernias, more by CAT scan than physical exam.      His pain seems somewhat out of proportion to what I can feel.  I      explained to him I though the options were continued observation of      his hernias, versus repairing his hernias.  I am not sure at all that the repairing of the hernias will correct or  control his pain.  He is interested in a laparoscopic inguinal hernia  repair.  I discussed with him the risks and complications.  Potential  risks include bleeding, infection, recurrence of the hernia, nerve  injury.  I told him there is probably a 5% plus or minus risk of hernia  recurrence over long term, but he is interested in going ahead with the  hernia repair at this time.  Again, I think he understands the risks and  benefits of hernia repairs  and the limits of what it may provide for taking care of his pain.  1. Lipoma of right lower abdominal [name].  I told him, if his hernia      repair goes well, I could easily remove this lipoma at the same      time, but I do not think this is a hernia, nor does he think it is      the source of his pain.  2. Lipoma, right forearm.  3. Noninsulin-dependent diabetes mellitus.  4. History of diverticulosis.  5. Rectal bleeding.  Apparently this has been attributed to internal      hemorrhoids.  6. Relatively recent cardiac evaluation, which has been negative.      [name], M.D.  Electronically Signed     DHN/MEDQ  D:  11/22/2006  T:  11/22/2006  Job:  [id number]   cc:   Robert Silence, MD  Fax: [phone]   [name]  [address]  [address]  [place]  [place] [address]   [name]  [address]  [place]  [place] [address]

## 2010-09-05 NOTE — Op Note (Signed)
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   OPERATIVE PROCEDURE:  Anterior cervical diskectomy and fusion.   SURGEON:  Dahari D. Shon Baton, MD   FIRST ASSISTANT:  Crissie Reese, PA   INSTRUMENTATION USED:  Synthes 0 profile plating system with size 8  lordotic interbody spacer with 14-mm screws placed inferiorly, 14-mm  screw placed on the left superior, and a 16 on the right superior.   COMPLICATIONS:  None.   CONDITION:  Stable.   ESTIMATED BLOOD LOSS:  35-40 mL.   HISTORY:  This is a very pleasant 60 year old gentleman who was in his  usual state of health until recently when he started complaining of  severe left arm pain.  He describes pain in the C8 distribution.  In  order to ensure he did not have a peripheral neuropathic probable, I did  do an EMG test which was unremarkable for ulnar nerve entrapment.  After  attempts at conservative management had failed and the MRI and clinical  exam confirmed a C7-T1 posterior lateral disk herniation with C8  irritation, a decision was made to take him to the operating room for an  anterior cervical diskectomy and fusion.  All appropriate risks,  benefits, and alternatives of surgery were discussed with the patient  and consent was obtained.   OPERATIVE NOTE:  The patient was brought to the operating theater and  placed supine on the operating table.  After successful induction of  general anesthesia and endotracheal intubation, TEDs, SCDs were applied  and arms were  secured to the side, and the anterior cervical spine was  prepped and draped in the usual fashion.  In order to obtain  visualization for x-ray, I used the Zimmer shoulder distractor which  allowed me to intermittently apply axial load to the shoulders in order  to get better visualization on x-ray.  However, because of his short  thick neck, it was very difficult to see the C7-T1 interbody space.  I  therefore used oblique views which were satisfactory.   Once I knew I was able to at least visualize the C7-T1 disk space, I  made an transverse incision just above the sternal notch.  Sharp  dissection was carried out down to the platysma and the platysma was  sharply incised.  I then began sharply dissecting along the medial  border of the sternocleidomastoid in a standard Smith-Robinson approach.  Once I was down into the deep cervical fascia, I continued to sharply  dissect until I could visualize the anterior longitudinal ligament.  I  then placed a thyroid retractor into the wound to sweep the  trachea and  esophagus medially and protect it.  I was able to visualize and palpate  the carotid sheath laterally and I protected that with a finger.  I then  placed an 18-gauge needle that was bent into the C7-T1 disk space and I  took the lateral x-rays.  I could not see the C7-T1 disk space on the  lateral x-ray, but I did place a Penfield 4 at the C6-C7 disk space and  I was able to visualize that I was at the C6-C7 disk space and the disk  space immediately caudal to me was where I had marked.  I did check the  oblique views and confirmed on the oblique views that I was at C7-T1.  Once I had checked and double-checked that I was at the appropriate  level, I began the diskectomy.   Using a bipolar electrocautery, I mobilized the longus colli muscles  from the midbody of C7 to the midbody of T1.  I then swept the longus  colli laterally and then placed my Casper retracting blades  underneath  the longus colli muscles.  I then deflated the endotracheal cuff,  expanded the retractors, and reinflated the endotracheal cuff.  I then  placed distraction pins into the bodies of C7 and T1.  I then incised  the annulus with a #15 blade knife and then using a combination of  pituitary rongeurs, curettes, and Kerrison rongeurs, I resected the  entire disk material.  Using the 2-3 mm Kerrison, I removed the  overhanging osteophytes from the inferior aspect of C7 and this gave me  excellent visualization of the disk space.  I then used my fine micro  curettes and nerve hooks to develop a plane underneath the posterior  longitudinal ligament and then I resected the posterior longitudinal  ligament in its entirety.  There was a posterolateral disk fragment  which was seen on MRI that I did remove from the left posterolateral  gutter.   At this point with the diskectomy completed, the endplates were  irrigated copiously with normal saline and then measured the interbody  space.  I then obtained a Synthes 0-profile size 8 lordotic cage and  packed it with Actifuse and local bone harvested from resection of the  osteophyte.  I then malleted it to the appropriate depth and then placed  2 screws superiorly on the lateral aspect and 2 screws inferiorly in the  medial aspect.  The right lateral side going into the body of C7, I had  some question of its purchase, so I replaced it with a thick tube and  got an excellent purchase.  All screws were properly torqued to their  final pressure.  I irrigated copiously with normal saline and took  repeat oblique views and an AP view confirming I had satisfactory  position of the hardware and graft.  At this point, I removed the  retracting system and returned the trachea and esophagus to midline.  I  copiously irrigated looking for any evidence of hemorrhage.  There were  some small superficial bleeders that I took care of using bipolar   electrocautery.  In order to maintain it, I placed FloSeal in the wound.  I then closed the plastyma with interrupted 2-0 Vicryl sutures and then  the skin with 3-0 Monocryl.  Steri-Strips and dry dressing were applied.  The patient was extubated and transferred to PACU without incident.  At  the end of the case, all needle and sponge counts  were correct.      Alvy Beal, MD  Electronically Signed     DDB/MEDQ  D:  06/24/2008  T:  06/25/2008  Job:  [id number]

## 2010-09-05 NOTE — Cardiovascular Report (Signed)
NAMEFOUNT, BAHE             ACCOUNT NO.:  [id number]   MEDICAL RECORD NO.:  [id number]          PATIENT TYPE:  OIB   LOCATION:  1966                         FACILITY:  [facility]   PHYSICIAN:  Peter C. Eden Emms, MD, FACCDATE OF BIRTH:  May 22, 1950   DATE OF PROCEDURE:  DATE OF DISCHARGE:  12/31/2007                            CARDIAC CATHETERIZATION   PROCEDURE:  Coronary arteriography.   INDICATIONS:  A 60 year old diabetic with recurrent chest pain and  abnormal Myoview.   Cine catheterization was done with 4-French catheters from the right  femoral artery and vein.  A 7-French venous sheath was used, a 4-French  arterial sheath was used, a JL-5 catheter was used to engage the left  main and traditional JR-4 catheter was used to engage the right.   Left main coronary artery was normal.   Left anterior descending artery was normal.  In the proximal and  midportion, there was a 30% tubular lesion.  In the distal vessel,  there was a large intermediate branch which was normal.  The first  diagonal branch was normal.   Circumflex coronary artery was large and normal.  There were two obtuse  marginal branches which were normal.   The right coronary artery was dominant.  There was a single PDA and two  posterolateral branches which were normal.   RAO ventriculography.  RAO ventriculography was normal, EF was 65%.  There was no gradient across the aortic valve and no MR.   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   The aortic root was injected due to the patient's dilated aortic root by  echo and need to use JL-5 catheter.   Right heart catheterization:  Mean right atrial pressure was 16, RV  pressure was 37/15, PA  pressure was 30/14, mean pulmonary capillary  wedge pressure was 15, LV pressure was 158/70 and aortic pressure was  158/97.   IMPRESSION:  The patient primarily would appear to have hypertensive  heart  disease.  There is no evidence of coronary artery disease.  His  aortic root will have to be watched either by chest x-ray or possibly  MRA in the future.  He has no significant coronary artery disease.  His  chest pain would appear to be noncardiac in etiology.  He tolerated the  procedure well.  I will see him back in the office in 6 months.     Noralyn Pick. Eden Emms, MD, [facility] [place] [facility]  Electronically Signed    PCN/MEDQ  D:  12/31/2007  T:  01/01/2008  Job:  [id number]

## 2010-09-05 NOTE — Assessment & Plan Note (Signed)
[facility]                         GASTROENTEROLOGY OFFICE NOTE   BURNETT, SPRAY                    MRN:          [id number]  DATE:07/15/2007                            DOB:          May 24, 1950    PROBLEM:  Difficulty swallowing.   HISTORY:  [name] is a pleasant 60 year old African-American male  previously known to Dr. Victorino Dike and recently seen by Dr. Sheryn Bison.  The patient has a history of diverticular disease and  external hemorrhoids.  His last colonoscopy was in June, 2007.  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  This was dilated with  a 56 Maloney.   The patient had called earlier today stating that he has been having  difficulty swallowing over the past week and a half.  He said it seemed  to have started about two weeks ago when he ate a piece of liver, which  transiently became lodged in his esophagus.  He says this eventually  went down, but ever since then, he has been having difficulty swallowing  solids.  He says he is afraid to eat much solid food at this point  because he does not like the sensation.  He has had an increase in  gagging and burping.  He has been eating very soft foods and has the  feeling they get to a certain point and stop, and eventually the food  seems to work its way down to his stomach.  He denies any difficulty  with liquids currently.  He does not really have any heartburn or  indigestion but has complained of some discomfort in his chest with  swallowing.  He has no complaints of abdominal pain.  He is not  currently on any PPI therapy.   The patient states that occasionally he sees a small streak of blood in  his stool but has no hemorrhoidal symptoms currently and is having 2-3  bowel movements per week.   CURRENT MEDICATIONS:  Glipizide 5/500 b.i.d.  No other regular meds  currently, as  he has run out.  He does use Flexeril on a p.r.n. basis  and Vicodin p.r.n.   ALLERGIES:  No known drug allergies.   PHYSICAL EXAMINATION:  A well-developed, somewhat anxious African-  American male in no acute distress.  Weight is 244.  Blood pressure 140/80, pulse 72.  CARDIOVASCULAR:  Regular rate and rhythm with S1 and S2.  No murmurs,  rubs or gallops.  PULMONARY:  Clear to A&P.  ABDOMEN:  Soft and nontender.  There is no mass or hepatosplenomegaly.  Bowel sounds are active.  RECTAL:  Not done at this time.   IMPRESSION:  A 60 year old male with recurrent dysphagia, suspect distal  esophageal stricture and probable component of esophagitis.   PLAN:  1. Schedule upper endoscopy with dilation as soon as possible.  2. Start Nexium 40 mg p.o. q.a.m.  3. Full liquid to very soft diet until endoscopy is done.  4. Advised Anusol-HC suppositories p.r.n. for his scan rectal  bleeding, which is likely due to his previously documented external      hemorrhoids.      Mike Gip, PA-C  Electronically Signed      Vania Rea. Jarold Motto, MD, [facility]ale[facility] Essex, FAGA  Electronically Signed   AE/MedQ  DD: 07/15/2007  DT: 07/15/2007  Job #: [id number]

## 2010-09-05 NOTE — Assessment & Plan Note (Signed)
[facility]                            CARDIOLOGY OFFICE NOTE   DIETRICH, SAMUELSON                    MRN:          [id number]  DATE:03/21/2007                            DOB:          12-29-1950    Mr. Razzano is a 60 year old patient of Dr. Daleen Squibb; he has also previously  been seen by Dr. Jacinto Halim in 2004.  At that time, he had normal coronary  arteries.  He was seen in the emergency room on March 14, 2007; he  had atypical chest pain.  The pain was centered in his chest.  There  seemed to be an anxiety component.  He felt like his heart was going to  burst out of his chest.  It was associated with some palpitations.  He  also had a generalized numb feeling on the left side of his body  including his face, shoulder, arm and legs.  He has been having these  intermittent symptoms. Of note, his mother recently passed of a heart  attack in the last 2 weeks.  He seems to have a lot of anxiety regarding  his family's risk factors including diabetes.   The patient was seen in the emergency room, ruled out for myocardial  infarction and discharged home to follow up with the [facility].   He just had a stress Myoview study done March 17, 2007; it had some  thinning of the apex, but no ischemia or infarction and was considered  low risk.  I personally reviewed these images and would agree with the  assessment and think that the apical thinning is probably a rotational  axis artifact.   The patient was given adenosine and did not walk on a treadmill.  Since  that time, he says he has not had any severe pain like he had before.   REVIEW OF SYSTEMS:  Remarkable for recent sinus infection; he has been  on amoxicillin for 2 weeks.  He seems to think that his nasal discharge  is clearing up.  In regards to his palpitations, they seem to revolve  around generalized anxiety.  He can get them worse when he works in the  line at work.  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  Otherwise, he has not had any rapid palpitations or presyncope.  The palpitations are not necessarily related to his chest pain, although  they are related to a sensation that his heart is going to burst.   Review of systems is otherwise negative.   CURRENT MEDICATIONS:  1. Actos 30 a day.  2. Lopressor 50 a day.  3. An aspirin a day.  4. Sertraline.  5. Zantac 150 a day.   PHYSICAL EXAMINATION:  His exam is remarkable for an overweight, middle-  aged black male in no distress.  His weight is 249.  Blood pressure is 118/75.  Pulse 66 and regular.  HEENT:  Unremarkable.  Affect appropriate.  NECK:  Supple.  No lymphadenopathy, thyromegaly or JVP elevation.  LUNGS:  Clear.  Good diaphragmatic motion.  No wheezing.  S1  and S2.  Normal heart sounds.  PMI normal.  ABDOMEN:  Benign.  Bowel sounds are positive.  No hepatosplenomegaly or  hepatojugular reflux.  No tenderness.  No AAA.  No bruits.  EXTREMITIES:  Femoral +3 bilaterally.  PTs +3.  No lower extremity  edema.  NEUROLOGIC:  Nonfocal.  No muscular weakness.  SKIN:  Warm and dry.   EKG shows essentially normal EKG with slightly poor R-wave progression.   I reviewed all of his lab work from his ER visit.   His blood sugars were a little high at 138; hematocrit was 40.  Creatinine was 1.2.  Cardiac markers were negative.   IMPRESSION:  1. Atypical chest pain in a diabetic with family history.  Continue      aspirin and beta blocker.  Low-risk Myoview.  Follow up with Dr.      Daleen Squibb in 3 months.  2. Generalized paresthesias in the left side.  Check head CT to rule      out transient ischemic attack or cerebrovascular accident, although      this would be unusual.  3. Anxiety.  Follow up with Dr. Hetty Ely.  He is a previous drug user,      but assure me that he has not used cocaine or alcohol in over a      year; however, he has a generalized anxious demeanor, which would       make we wonder.  4. Recent sinusitis.  Continue amoxicillin.  Follow up with Dr.      Hetty Ely.  No indication for need for CT of the sinuses at this      point.  5. Diabetes.  Check hemoglobin A1c.  I suspect that he is a type 2      diabetic due to his weight gain.  He will continue his Actos and      low-carbohydrate diet.  6. History of gastritis.  Continue Zantac 150 a day, low-spice diet,      avoid late night meals.   Overall, I think his heart is doing fine and he will follow up with Dr.  Daleen Squibb in 3 months.     Noralyn Pick. Eden Emms, MD, [facility]  Electronically Signed    PCN/MedQ  DD: 03/21/2007  DT: 03/21/2007  Job #: [id number]

## 2010-09-05 NOTE — Assessment & Plan Note (Signed)
[facility]                         GASTROENTEROLOGY OFFICE NOTE   LODEN, LAURENT                    MRN:          [id number]  DATE:10/17/2006                            DOB:          [birth year]    Mr. Cynthia continues with right lower quadrant pain of a severe nature  not relieved by Darvocet-N 100.  A CT scan showed bilateral inguinal  hernias.  Abdominal exam seemed to suggest a right lower quadrant  abdominal wall hernia.  He has marked tenderness to palpation.  I  referred him on to surgery to be seen today for their exam and opinion.  The patient did have a negative colonoscopy a year ago, except for  diverticulosis.     Vania Rea. Jarold Motto, MD, [facility], FAGA  Electronically Signed    DRP/MedQ  DD: 10/17/2006  DT: 10/17/2006  Job #: [id number]   cc:   [facility]

## 2010-09-05 NOTE — H&P (Signed)
[name], [name]             ACCOUNT NO.:  [id number]   MEDICAL RECORD NO.:  [id number]          PATIENT TYPE:  AMB   LOCATION:                               FACILITY:  [facility]   PHYSICIAN:  Alvy Beal, MD    DATE OF BIRTH:  07/20/1950   DATE OF ADMISSION:  06/24/2008  DATE OF DISCHARGE:                              HISTORY & PHYSICAL   Patient is scheduled to undergo a 2-level anterior cervical diskectomy  and fusion by Dr. Shon Baton on Thursday, June 24, 2008, at [facility].   CHIEF COMPLAINT:  Neck and left-sided arm pain.   BRIEF HISTORY:  [name] is a very pleasant 60 year old gentleman who  we initially began treating in January.  He has had ongoing neck and  left upper extremity for approximately 2-1/2 to 3 years.  They  unfortunately had been progressively getting worse and was initially  seen and treated by our partner, Dr. Penni Bombard, and unfortunately did not  improve.  Patient states that his pain was approximately an 8/10 and  unfortunately despite the use of physical therapy and injection therapy  and other conservative management issues patient did not get any relief.  Dr. Shon Baton felt that because he continues to have radicular arm pain in  the C8 distribution and he has a positive C7-T1 disk herniation based on  his MRI from December the best course of action would be an anterior  cervical diskectomy and a fusion at C7 to T1.  Dr. Shon Baton went over the  risks and benefits of the surgery.  They include infection, bleeding,  nerve damage, death, stroke, paralysis, failure to heal, need for  further surgery, ongoing or worse pain, temporary or permanent throat  pain, swallowing difficulties and hoarseness of the voice.  Also,  possible loss of bowel or bladder control.  Patient understood the risks  and benefits of the procedure and agreed that surgery was the best  choice.   PAST MEDICAL HISTORY:  Patient's past medical history includes:  1.  Hypertension.  2. Hyperlipidemia.  3. Acid reflux.  4. Diabetes.   PAST SURGICAL HISTORY:  Includes:  1. A carpal tunnel release on the right.  2. Tonsillectomy.   CURRENT MEDICATIONS:  Patient's current medications include:  1. Tramadol 50 mg.  2. Metformin.   ALLERGIES:  PATIENT HAS NO KNOWN FOOD ALLERGIES, LATEX ALLERGIES, METAL  ALLERGIES, OR ANY MEDICATION ALLERGIES.   SOCIAL HISTORY:  Patient does not use alcohol or cigarettes.  Patient  currently lives in a 1-level apartment and does have a caregiver set up  for after the surgery.   REVIEW OF SYSTEMS:  In the office today:  GENERAL:  Positive for weight  change.  Patient has had weight gain secondary to inability to work out  because of pain.  Otherwise, negative for fevers, chills, loss of  memory, night sweats, or fatigue.  HEENT:  Otherwise negative for  headache, blurred vision, double vision, dizziness, hearing loss,  paralysis, weakness, tremor, blackout spells, insomnia, balance  problems, dentures, and/or ringing in the ears.  DERM:  Otherwise  negative for any rashes, itching, hives, lesions, or eczema.  RESPIRATORY:  Positive for some shortness of breath on exertion was  otherwise negative for shortness of breath at rest, cough, wheezing,  coughing up blood, and/or seasonal allergies.  CARDIOVASCULAR:  Positive  for some occasional palpitations which patient attributes to his  anxiety.  Otherwise, negative for difficulty breathing lying flat, any  murmurs.  GI:  Otherwise negative for any nausea, vomiting, diarrhea,  constipation, blood in stool, jaundice, heartburn, difficulty  swallowing, loss of appetite, and/or abdominal pain.  GU:  Otherwise  negative for painful urination, urinary frequency, blood in urine,  incontinence, urinary discharge, weak stream, urinating at night, flank  pain, urgency, or urinary retention.  MUSCULOSKELETAL:  Otherwise  negative for any other joint pain, joint swelling, spasms,  morning  stiffness, and/or muscular weakness.   PHYSICAL EXAM:  Mr. Kappes is a very pleasant 60 year old gentleman who  appears his stated age.  He is alert and oriented x3.  He is in no acute  distress.  Mood and affect are appropriate.  SKIN:  Negative for any scars, rashes, or bruises in the neck area.  HEENT:  Atraumatic, normocephalic.  Pupils are PERRLA.  Ears are  symmetric with no discharge.  Throat is negative for any tonsillar  enlargement.  NECK:  Supple.  He has a decreased range of motion of the spine.  No  trachea deviation and no increased thyroid size.  HEART:  Regular rate and rhythm.  No murmurs, rubs, gallops, or clicks.  LUNGS:  Clear to auscultation.  No wheezes or crackles were noted.  ABDOMEN:  Soft and nontender.  Bowel sounds were present in all 4  quadrants.  There was no rebound tenderness.  No masses or guarding.  No  increased liver or spleen size was noted.  MUSCULOSKELETAL:  As far as the upper extremities are concerned, he is  having some weakness in the C8 distribution of his left arm.  Otherwise,  he is 5/5 compared bilaterally.  Deep tendon reflexes are 2+ and  symmetrical.  Isolated shoulder, L1 wrist range of motion caused no  increased pain.  NEUROVASCULAR:  Distal pulses are intact.  Sensations are intact to  light touch.   ASSESSMENT/PLAN:  Patient is scheduled to undergo an anterior cervical  diskectomy and fusion Thursday, June 24, 2008.   ESTIMATED HOSPITAL STAY:  Two to 3 days.  Thank you very much please see  copy of this note to our office at [facility] orthopedics seen for a  much.      Crissie Reese, PA      Alvy Beal, MD  Electronically Signed    AC/MEDQ  D:  06/23/2008  T:  06/23/2008  Job:  [id number]

## 2010-09-05 NOTE — Op Note (Signed)
[name], [name]             ACCOUNT NO.:  [id number]   MEDICAL RECORD NO.:  [id number]          PATIENT TYPE:  INP   LOCATION:  3107                         FACILITY:  [facility]   PHYSICIAN:  Alvy Beal, MD    DATE OF BIRTH:  1951-03-05   DATE OF PROCEDURE:  DATE OF DISCHARGE:                               OPERATIVE REPORT   FIRST ASSISTANT:  Crissie Reese, PA   PREOPERATIVE DIAGNOSIS:  Hematoma causing tracheal obstruction and  deviation.   POSTOPERATIVE DIAGNOSIS:  Hematoma causing tracheal obstruction and  deviation.   OPERATIVE PROCEDURE:  Evacuation of hematoma.   HISTORY:  [name] is a very pleasant 60 year old gentleman who  yesterday underwent a two-level revision anterior cervical diskectomy  and fusion.  This morning, he was complaining of some shortness of  breath.  He stated that he was feeling this for overnight, but did not  inform anyone.  A CT scan done this morning demonstrated about a 3-cm  deviation of the trachea anterior into the right.  The patient continue  to complain of persistent shortness of breath.  As a result after  reviewing the CT scan and his clinical exam, I was concerned about  possible expanding hematoma versus angioedema.  Either way, I was  concerned and so, I elected to take him back to the operating room and  that way he could be intubated, evacuate the hematoma and remain  intubated.   This was discussed with the patient that he remain intubated  preoperatively.   All appropriate risks, benefits, and alternatives were explained to the  patient and consent was obtained.   OPERATIVE NOTE:  The patient was brought to the operating room.  After  careful wake intubation by Anesthesia, it was noted that he had  significant epiglottal swelling and vocal cord compression from the  swelling.  He was intubated.  It should be noted that prior to start of  the procedure, appropriate time-out was done confirming patient and  procedure.   The previous incision was re-incised and the Monocryl and 2-0 Vicryl  sutures were removed.  Upon initial exploration, there was a significant  hematoma, which was easily evacuated.   I then gently swept from superficial to deep irrigating and irrigating  out the wound to find the potential source for hemorrhage.  I was then  able to get to the anterior cervical spine, I visualized my 5-6 and 6-7  graft.  There was no significant hematoma that I could see.  There was a  small blood vessel, which was not actively bleeding, but I did tie off.  At this point, I saw no active hemorrhage.  I copiously irrigated  multiple times and at all times, being removed was clear, there was no  active bleeding.  At this point in time, I placed FloSeal into the wound  and returned the trachea and esophagus to midline.  I then took an  intraoperative x-ray.  The endotracheal tube itself remained deviated to  the right.  At this point, I felt as though the significant problem was  no longer hematoma, but  significant perioperative swelling.  Because of  this, the decision was made to keep him intubated.  Because he was  diabetic, I could not provide him with steroids to help reduce his  inflammation.   At this point in time through a second stab incision, I placed a deep  drain and then closed the platysma with interrupted 2-0 Vicryl sutures.  I used a running vertical mattress 2-0 Prolene for the skin.  Bulky dry  dressing was applied and the drain was stitched to the skin.  His Aspen  collar was reapplied and he was transferred to the neurointensive care  unit.  All sponge and needle counts were correct at the end of the case.      Alvy Beal, MD  Electronically Signed     DDB/MEDQ  D:  10/29/2008  T:  10/30/2008  Job:  [id number]

## 2010-09-05 NOTE — Op Note (Signed)
[name], [name]             ACCOUNT NO.:  [id number]   MEDICAL RECORD NO.:  [id number]          PATIENT TYPE:  AMB   LOCATION:  DAY                          FACILITY:  [facility]   PHYSICIAN:  Sandria Bales. Ezzard Standing, M.D.  DATE OF BIRTH:  03/01/1951   DATE OF PROCEDURE:  11/22/2006  DATE OF DISCHARGE:                               OPERATIVE REPORT   PREOP DIAGNOSES:  1. Bilateral inguinal hernias.  2. Mass right lower abdominal wall.   POSTOP DIAGNOSES:  1. Bilateral inguinal hernias, direct right larger than left.  2. A 5 centimeter right lower quadrant abdominal lipoma.   PROCEDURE:  Laparoscopic bilateral inguinal hernia repair with atrium  precut mesh and excision of lipoma of right lower quadrant.   SURGEON:  Dr. Ezzard Standing.   FIRST ASSISTANT:  None.   ANESTHESIA:  General with approximately 25 mL of 0.25% Marcaine.   COMPLICATIONS:  None.   INDICATIONS FOR PROCEDURE:  [name] is a 60 year old black male, a  patient of Dr. Laurita Quint, who had been seeing Dr. Sheryn Bison  for some right lower quadrant pain.  He was referred to our office after  a CT scan revealed what appears to be bilateral inguinal hernias.   The patient has right inguinal pain.  I had a fairly lengthy discussion  with him and I told hime that I am not sure the hernias are the source  of his pain.  He also has a mass on his right lower quadrant which I  think is a lipoma and not a hernia.   I discussed with him the indication and potential complications of  hernia repair.  Potential complications include bleeding, infection,  nerve injury and recurrence of the hernia and the patient himself wanted  to go ahead with these hernia repairs, though there is some uncertainty  whether this will solve his lower abdominal pain issues.   OPERATIVE NOTE:  The patient was taken to the operating room, underwent a general  endotracheal anesthetic.  A time-out was held identifying the patient  and the  procedure.  I had actually marked this right lower quadrant  abdominal mass, what I though was a lipoma, preoperatively.  He was  shaved, a Foley catheter was placed and he was given 1 g of Ancef at the  initiation of the procedure.   I went through an infraumbilical incision.  Since his pain is on the  right side, I went to the right of midline and went to the anterior  rectus fascia, retracted the rectus abdominis muscle anteriorly and then  passed a [place] surgical preperitoneal balloon into the preperitoneal  space and insufflated this under direct visualization.   I was able to visualize the dissection with the balloon.  I got a pretty  good dissection of the right side, a more minimal dissection on the left  side.   They removed the balloon, placed a 12 cm Hasson trocar and placed two 5  mm trocars, one in the right lower quadrant and one in the left lower  quadrant.  I then did carry out a dissection.  I did have a small hole  in the peritoneum on the left side, but I think this was high and well  away from where our dissection was and I do not think it will affect his  repairs.   I dissected out the right side where he did have kind of fatty weakness  and defect in the inguinal floor which could be consistent with a small  right direct inguinal hernia.  I isolated the cord structure, found no  evidence of an indirect component on the left side.  He had more of a  weakness of the inguinal floor which I think was early left inguinal  hernia.  I dissected out the cords and found no evidence of any sac  going down the cords.   He had a fairly narrow pelvis and for this reason I thought was really a  good candidate using the atrium precut mesh.  So on both sides I placed  an atrium precut mesh.  I wrapped the mesh around the cords on both  sides.  The mesh was then stapled in place using the ProTack.  I put 9  tacks on the right side, 11 tacks on the left side.  I tacked to   Cooper's ligament inferiorly,  to the pubic tubercle medially,  to the  underlying surface of the rectus abdominis muscle anteriorly and  laterally.  I avoided the area lateral to the cord structures and  inferior to the ileopubic tract.   I used 9 tacks on the right, 11 tacks on the left.  The mesh laid flat  and looked good.  Photos were taken, these were posted in our office  chart.   I then desufflated the preperitoneal space.  I closed the rectus  abdominis wound with a #0 Vicryl suture, the skin with a #5-0 Vicryl  suture, painted the wound with Tincture of benzoin and Steri-Strips.   I turned my attention to the right lower quadrant where again I could  feel this mass that I think was a lipoma.  I made an incision there  which turned out to be a lipoma.  It was multilobulated, at least 5-6 cm  in diameter.  I excised all of this, controlled bleeding with Bovie  electrocautery.  I closed it with #3-0 Vicryl sutures and a #5-0  subcuticular Vicryl suture, painted the wound with Tincture of benzoin  and Steri-Strips.  The patient tolerated the procedure well, was  transported to the recovery room in good condition.  Sponge and needle  count were correct at the end of the case.  Plan to discharge her home  today.   I will limit his lifting for 1 month.  He says he cannot, there is no  job really at work that he can do without lifting so I have written him  for 1 month out of work.  His return appointment is with me in 2-3  weeks, he is to call for that.      Sandria Bales. Ezzard Standing, M.D.  Electronically Signed     DHN/MEDQ  D:  11/22/2006  T:  11/22/2006  Job:  [id number]   cc:   Thomas C. Wall, MD, FACC  [address]  [address]  [place]  [place] [address]   Arta Silence, MD  Fax: [phone]   Vania Rea. Jarold Motto, MD, FACG, FACP, FAGA  [address]  [place]  [place] [address]

## 2010-09-05 NOTE — Assessment & Plan Note (Signed)
[facility]                            CARDIOLOGY OFFICE NOTE   [name], [name]                    MRN:          [id number]  DATE:04/20/2008                            DOB:          Feb 18, 1951    [name] returns today for followup.  He was in a fairly angry mood,  the bottom line is that he continues to have atypical chest pain despite  a normal cath, and he wants something done about it.   I explained to Lebo that I did not think he had a heart problem.  His  pain is very atypical.  He actually complains more neuralgias in the  left side of his face.  He also complains of tingling and sharp pains in  the left side of his chest, going down to his arm.   He has a history of previous cocaine abuse, but denies any recent  activity.   He is a diabetic, maybe susceptible to neuropathy.  He denies any  chronic cervical spine problems.   It took me quite a while to calm the patient down.   I explained to him that he had a normal heart catheterization.  Dr.  Hetty Ely had given him some form of nitroglycerin.  I told him he should  not take it.  There is no evidence of spasm here.  He had large  arteries.  The nitroglycerin was giving him a headache at the base of  his neck.   The patient's pain is not necessarily exertional.  It can occur at night  or at rest.  It can occur with him lying on his left side.   Given the fact that his pain is more neuropathic.  I think he would do  better with a medication like Neurontin.   Looking back through his records, he has not had a CT of the chest to  rule out any other mediastinal abnormalities that could be causing  atypical chest pain.  He also has not had any further workup of his  cervical spine.   REVIEW OF SYSTEMS:  Negative for significant shortness of breath.  He  gets some diaphoresis with his nitroglycerin only.  He has not had any  syncope or palpitations.   CURRENT MEDICATIONS:  1. Actos 30 a day.  2. Vicodin p.r.n.  3. Naprosyn p.r.n.  4. Nitroglycerin compound p.r.n.   He has no known allergies.   His cath showed no significant coronary artery disease.   PHYSICAL EXAMINATION:  GENERAL:  Remarkable for an angry black male who  is overweight.  VITAL SIGNS:  His blood pressure is 150/80, his pulse is 88 and regular,  respiratory rate 14, and afebrile.  HEENT:  Unremarkable.  NECK:  Carotids are normal without bruit.  No lymphadenopathy,  thyromegaly, or JVP elevation.  LUNGS:  Clear.  Good diaphragmatic motion.  No wheezing.  CARDIAC:  S1 and S2.  Normal heart sounds.  PMI normal.  ABDOMEN:  Benign.  Bowel sounds positive.  No AAA.  No tenderness.  No  bruit.  No hepatosplenomegaly.  No hepatojugular reflux.  EXTREMITIES:  Distal pulses are intact.  No edema.  NEUROLOGIC:  Nonfocal.  SKIN:  Warm and dry.  MUSCULOSKELETAL:  No muscular weakness.   IMPRESSION:  1. Atypical chest pain without evidence of coronary artery disease by      catheterization.  Check a noncontrast CT of the chest and cervical      MRI.  Discontinue nitroglycerin.  We will start Neurontin 300      t.i.d. for 2 weeks and then cut it back to 300 b.i.d.  To follow up      with Dr. Hetty Ely, I personally called Dr. Hetty Ely and explained      to him the fact that I did not think the patient needed      nitroglycerin and that we were starting Neurontin and doing further      workup.  He will need to follow up with Dr. Hetty Ely regarding his      atypical chest pain.  2. Diabetes.  Currently, on Actos.  May not be an ideal medication      given his tendency towards volume overload.  Hemoglobin A1c      quarterly.  Continue with low-carbohydrate diet.  3. Hypercholesterolemia, currently not on therapy.  Follow up with Dr.      Hetty Ely to consider statin drug therapy.   I would probably not start any other medication until his atypical pain  has resolved and he has been on the  Neurontin for a while.   Again, considerable time was spent with Allyne Gee today to calm him down.  It worries me a little bit that his personality seems more volatile in  regards to possible recurrent drug use.     Noralyn Pick. Eden Emms, MD, [facility]  Electronically Signed    PCN/MedQ  DD: 04/20/2008  DT: 04/20/2008  Job #: [id number]

## 2010-09-05 NOTE — Op Note (Signed)
NAMEKENNEN, STAMMER             ACCOUNT NO.:  [id number]   MEDICAL RECORD NO.:  [id number]          PATIENT TYPE:  OIB   LOCATION:  5017                         FACILITY:  [facility]   PHYSICIAN:  Alvy Beal, MD    DATE OF BIRTH:  25-Oct-1950   DATE OF PROCEDURE:  10/28/2008  DATE OF DISCHARGE:                               OPERATIVE REPORT   continuation of operative dictation:  Procedure: 2 level ACDF C5-7   Once the exposure was complete I identified the C5/6 space and confirmed  my location with intra-operative flouro.  I than used a bovie to  mobilize the longus coli muscles laterally to the level of the unco-  vertebral joints.  I than placed distraction pins into the bodies of  C5,6, and 7.   The 5/6 space was distracted and the annulus incised with a 15 blade  scaple.  Using putitary ronguers, currettes, and kerrisons I removed the  entire 5/6 disc.  I could now visualize the PLL.  I was able to remove  the posterior longitudinal ligament.  I used a fine curette to develop a  plane underneath the posterior longitudinal ligament and used a 1-mm  Kerrison to resect this.  Once, I had an adequate diskectomy, I rasped  the endplates to ensure I had bleeding endplates.  I took a size #8 zero  profile interbody spacer, packed with Actifuse and malleted it to the  appropriate depth.  I then secured it superiorly with two 16-mm screws  into the body of C5 and 14 mm screws into the body of C6.  I had  excellent fixation and I torqued them down appropriately.  With the ACDF  completed at C5-6, I turned my attention to C6-7.     The retractors were repositioned and I distracted the endplates in a  similar fashion.  Once I had an adequate exposure, I removed the entire  C6-7 disk in a similar fashion that I did at C5-6.  Again, I took down  the posterior longitudinal ligament with a 1-mm Kerrison.  Because, he  had mostly right radicular pain, I made sure I took down the  uncovertebral joint spur that was at the right side of C6-7.  At this  point, I was able to fairly get a bone #8, precut fibular allograft  lordotic spacer packed with Actifuse.  I did not think that I would be  able to adequately have enough room to place the plate for use the 0  profile device.  At this point, I elected not to use the instrumentation  as I was concerned about vascular or esophageal injury.  At this point,  I irrigated copiously with normal saline and then obtained hemostasis  using bipolar electrocautery.  I removed all the retracting devices and  then swept the lateral side to ensure the esophagus was not entrapped  and there was no injury.  I returned to trachea and esophagus to midline  and then irrigated copiously with normal saline.  Final x-rays were  difficult to get because of his body habitus, but they appear grossly  intact with an adequate position.   It should be noted that at the start of the case, prior to incision, we  did take an appropriate time-out confirming patient, procedure, and  extremity with pain.  The first assistant in the case is Crissie Reese.  She will also be noted that we did take a marker film with a  needle in the C5-6 disk space confirming that we are at the appropriate  level.      Alvy Beal, MD  Electronically Signed     DDB/MEDQ  D:  10/28/2008  T:  10/29/2008  Job:  [id number]   cc:   [facility]

## 2010-09-05 NOTE — Op Note (Signed)
NAMEADRON, GEISEL             ACCOUNT NO.:  [id number]   MEDICAL RECORD NO.:  [id number]          PATIENT TYPE:  OIB   LOCATION:  5017                         FACILITY:  [facility]   PHYSICIAN:  Alvy Beal, MD    DATE OF BIRTH:  September 23, 1950   DATE OF PROCEDURE:  10/28/2008  DATE OF DISCHARGE:                               OPERATIVE REPORT   PREOPERATIVE DIAGNOSIS:  Right cervical C6-C7 radicular arm pain.   POSTOPERATIVE DIAGNOSIS:  Right cervical C6-C7 radicular arm pain.   OPERATIVE PROCEDURE:  Anterior cervical diskectomy and fusion at C5-6  and C6-7.   INSTRUMENTATION USED:  C5-6 with zero profile, Synthes zero profile,  ACDF graft with screw fixation at C6-7 placed an allograft size 8,  precut fibular ACDF spacer, packed with Actifuse.  No instrumentation at  that level.   COMPLICATIONS:  None.   INTRAOPERATIVE FINDINGS:  Significant scar tissue from previous C7-T1  ACDF limiting the exposure at C6-7.  Because of limited exposure and  potential risk to esophagus and carotid sheath, I elected to place only  the bone in the interbody space.  I had excellent fixation.  C6-7 was  able to get zero profile without problem and screw fixation.   FIRST ASSISTANT:  Crissie Reese, PA.   HISTORY:  Mr. Mancuso is a very pleasant 60 year old gentleman who in  March 2010 underwent a C7-T1 for left C8 radiculopathy.  The patient  resolved his left arm pain and did well initially.  Postoperatively, he  started developing horrific right radicular arm pain and this occurred  in the C6 and C7 distribution.  Attempts at conservative management to  control the radicular arm pain had failed and so he elected to proceed  with surgery.  All appropriate risks, benefits, and alternatives were  discussed with the patient.  Consent was obtained.   OPERATIVE NOTE:  The patient was brought to the operating room, placed  supine on the operating table.  After successful induction of general  anesthesia and endotracheal intubation, TEDs and SCDs were applied.  The  rolled towels were placed behind the neck.  His shoulders were taped to  the side and the anterior cervical spine was prepped and draped in  standard fashion.   The patient had previous transverse incision made at C7-T1 disc space,  and because of his body habitus I elected to use a standard longitudinal  incision.  A left-sided incision was made and a standard Smith-Robinson  approach to the anterior cervical spine was performed.  I dissected  along the medial border of the sternocleidomastoid and identified the  omohyoid muscle.  The omohyoid was transected using a Bovie for better  exposure.  I then began sweeping the esophagus medially and protecting  it with my finger.  I was able to retract both the tracea and esophagus  with a cloward retractor.    There was significant scar tissue over the C6-7 disk space.  I gently  mobilized the esophagus.  There was bleeding within the scar tissue  which I was able to coagulate with the bipolar.  Once I had  an adequate  exposure, I placed deep Caspar retractors into the wound.  I deflated  the endotracheal cuff and expanded the wound, expanded the retractors  and reinflated the endotracheal cuff.  I placed distraction pins into  the bodies of the C5 and C6.    DICTATION ENDED AT THIS POINT.  Technical error - dictation completed  seperately.      Alvy Beal, MD  Electronically Signed     DDB/MEDQ  D:  10/28/2008  T:  10/29/2008  Job:  [id number]

## 2010-09-05 NOTE — Procedures (Signed)
[name], [name]             ACCOUNT NO.:  [id number]   MEDICAL RECORD NO.:  [id number]         PATIENT TYPE:  OUT   LOCATION:  SLEEP CENTER                 FACILITY:  [facility]   PHYSICIAN:  Coralyn Helling, MD        DATE OF BIRTH:  Feb 02, 1951   DATE OF STUDY:  05/07/2008                            NOCTURNAL POLYSOMNOGRAM   REFERRING PHYSICIAN:  Leslye Peer, MD   REFERRING PHYSICIAN:  Levy Pupa, MD   INDICATION:  [name] is a 60 year old male with a history of  hypertension, diabetes, and previous TIA.  He also has symptoms of sleep  disruption and excessive daytime sleepiness.  He is referred to the  Sleep Lab for evaluation of hypersomnia with obstructive sleep apnea.   Height is 5 feet 9 inches, weight is 240 pounds, BMI is 35, and neck  size is 18 inches.   MEDICATIONS:  Glipizide, Vicodin, simvastatin, metoprolol, alprazolam,  gabapentin, and Aciphex.   EPWORTH SCORE:  14.   SLEEP ARCHITECTURE:  Total recording time was 373 minutes.  Total sleep  time was 333 minutes.  Sleep efficiency was 89%.  Sleep latency was 11  minutes.  REM latency was 130 minutes.  The study was notable for the  lack of slow-wave sleep.  The patient slept in both the supine and non-  supine position.   RESPIRATORY DATA:  The average respiratory rate was 20.  The overall  apnea-hypopnea index was 8.1 and a respiratory disturbance index was  9.4.  Events were exclusively obstructive in nature.  Mild snoring was  noted by the technician.  The REM apnea-hypopnea index was 15.  The non  REM apnea-hypopnea index was 7.  The supine apnea-hypopnea index was  8.1.  The nonsupine apnea-hypopnea was 2.2.   OXYGEN DATA:  The baseline oxygenation was 97%.  The patient spent the  entire study was an oxygen level of 90% and his oxygen saturation nadir  was 92%.   CARDIAC DATA:  The average heart rate was 56 and the rhythm strip showed  normal sinus rhythm.   MOVEMENT/PARASOMNIA:  The periodic  limb movement index was 8.1.  The  patient had 1 resting trip.   IMPRESSION:  This study shows evidence for mild obstructive sleep apnea.  He did have a significant REM effect to his sleep disordered breathing.   I recommend that the patient undergo counseling regarding the importance  of diet, exercise, and weight reduction.  If this is unsuccessful and  depending upon the patient's symptom status, then additional  interventions could include CPAP therapy,  oral appliance, or surgical  intervention.      Coralyn Helling, MD  Diplomat, American Board of Sleep Medicine     VS/MEDQ  D:  05/08/2008 11:21:09  T:  05/09/2008 02:39:56  Job:  [id number]

## 2010-09-05 NOTE — Discharge Summary (Signed)
[name], [name]             ACCOUNT NO.:  [id number]   MEDICAL RECORD NO.:  [id number]         PATIENT TYPE:  JCAR   LOCATION:                               FACILITY:  [facility]   PHYSICIAN:  Noralyn Pick. Eden Emms, MD, FACCDATE OF BIRTH:  [birth year]   DATE OF ADMISSION:  12/31/2007  DATE OF DISCHARGE:  12/31/2007                               DISCHARGE SUMMARY   Coronary arteriography.   INDICATIONS:  Diabetic with abnormal Myoview, recurrent chest pain.   Dictation ended at this point.      Noralyn Pick. Eden Emms, MD, [facility]     PCN/MEDQ  D:  12/31/2007  T:  01/01/2008  Job:  [id number]

## 2010-09-05 NOTE — H&P (Signed)
NAMETABOR, DENHAM             ACCOUNT NO.:  [id number]   MEDICAL RECORD NO.:  [id number]          PATIENT TYPE:  INP   LOCATION:  5037                         FACILITY:  [facility]   PHYSICIAN:  Judie Petit, M.D. DATE OF BIRTH:  [birth year]   DATE OF ADMISSION:  06/24/2008  DATE OF DISCHARGE:  06/25/2008                              HISTORY & PHYSICAL   Mr. Romanoski is a 60 year old male who underwent ACDF by Dr. Venita Lick, June 24, 2008.  The patient appears to have had an uneventful  intraoperative course.  The patient called the anesthesia office this  week, complaining of back, question molar teeth pain.  Upon reviewing  chart, there was no dental problems noted.  Today earlier, I called the  patient at [phone] to discuss concerns.  I informed the patient of  above and offered to see and evaluate him at his convenience.  The  patient declined followup.  I encouraged the patient to follow up with  Dr. Shon Baton and myself again if he gets time.   PLAN:  Call Dr. Shon Baton and informed of above.  Dr. Shon Baton will try to  follow up with the patient.      Judie Petit, M.D.  Electronically Signed     CE/MEDQ  D:  07/20/2008  T:  07/21/2008  Job:  [id number]

## 2010-09-05 NOTE — Assessment & Plan Note (Signed)
[name]                         GASTROENTEROLOGY OFFICE NOTE   [name], [name]                    MRN:          [id number]  DATE:10/01/2006                            DOB:          1950/08/23    [name] is a 60 year old African-American male whom I have seen in  the past for rectal bleeding with colonoscopy and polypectomy some 10  years ago.  Since that time, he has been under the care of [name] and has had several colonoscopies and has diverticulosis and  internal hemorrhoids.   He has recently had some asymptomatic rectal bleeding and also complains  of pain in his right lower quadrant.  His pain is worse with lifting and  bending.  He has had no real change in his bowel habits, except for some  chronic loose stools, and he does have known lactose intolerance.  He  previously had evaluation by [name] and apparently has non-cardiac  chest pain.   PAST MEDICAL HISTORY:  Remarkable for adult-onset diabetes and he is on  Actos 30 mg a day.   FAMILY HISTORY:  Noncontributory.   EXAMINATION:  He weighs 240 pounds, blood pressure 122/76, pulse 80 and  regular.  He is a healthy-appearing male in no distress.  Appearing his stated  age.  ABDOMEN:  Shows protrusion and bulge to the right lower quadrant area.  No definite hernia in his right inguinal area.  Abdominal exam otherwise  was unremarkable.  RECTAL:  Inspection of his rectum was unremarkable, as was rectal exam.  Stool was guaiac negative.   ASSESSMENT:  1. Recurrent hemorrhoidal rectal bleeding.  2. Lactose intolerance.  3. Right lower quadrant, what appears to be abdominal wall hernia on      exam.  4. Adult-onset diabetes.   RECOMMENDATIONS:  1. Canasa 1 g suppositories at bedtime.  2. P.r.n. Darvocet-N 100 for pain at his request.  3. CT scan of the abdomen and pelvis.  4. Screening laboratory parameters.  5. GI followup in 2 weeks' time.     Robert Rea. Jarold Motto, MD, [name], FAGA  Electronically Signed    DRP/MedQ  DD: 10/01/2006  DT: 10/01/2006  Job #: [id number]   cc:   Robert Silence, MD

## 2010-09-05 NOTE — Assessment & Plan Note (Signed)
[facility]                            CARDIOLOGY OFFICE NOTE   [name], [name]                    MRN:          [id number]  DATE:07/01/2007                            DOB:          [birth year]    [name] returns today for follow-up.  He is a diabetic with  hyperlipidemia and hypertension.   When I last saw him he was having fairly atypical chest pain.  His  Myoview is low-risk, done March 17, 2007. He had some apical thinning  with a question of small prior infarct.  EF was 57%.  I do not think the  patient's symptoms warranted a heart cath.  He has had a normal heart  cath in 2004 by Dr. Jacinto Halim.   In talking to the patient, he continues to have problems with anxiety  particularly at work.  He describes atypical chest pain, sharp in the  center of his chest radiating down the arm like pins and needles. It is  not always exertionally related.  It is related to being busy at work.   The patient also complains of shortness of breath.  Again this tends to  occur at work when things get busy.  The shortness of breath is not  accompanied by cough or sputum production.  There is no increase in  lower extremity edema.  There is no chest pain with it.   His review of systems is otherwise negative.   His medications include Actos 30 a day, metoprolol 50 a day, sertraline  50 mg a day, Zantac and an aspirin a day.   PHYSICAL EXAMINATION:  Remarkable for an overweight black male in no  distress.  Weight is 245, blood pressure is 155/93, pulse 62 and  regular, afebrile.  HEENT:  Unremarkable.  Carotids normal, without bruit, no  lymphadenopathy, thyromegaly or JVP elevation.  LUNGS:  Clear, good diaphragmatic  motion.  No wheezing.  CARDIAC:  S1-S2, normal heart sounds, PMI normal.  ABDOMEN:  Benign.  Bowel sounds positive.  No AAA, no  hepatosplenomegaly, hepatojugular reflux.  No tenderness, no bruit.  EXTREMITIES:  Distal pulses intact, no  edema.  NEURO:  Nonfocal.  SKIN:  Warm and dry.  MUSCULOSKELETAL:  No muscular weakness.   EKG shows sinus rhythm with a low atrial focus,  otherwise normal.   IMPRESSION:  1. Atypical chest pain, multiple risk factors, continue aspirin and      beta blocker, no indication for cath.  2. Dyspnea, functional likely related to anxiety, no evidence of      cardiopulmonary disease and normal LV function by previous echo.  3. Anxiety.  The patient may need a higher dose of sertraline, to      increase Zoloft to 100 a day, leave this up to Dr. Hetty Ely.  He      continues to seem to have issues with this  4. Low atrial focus, maintain Lopressor 50 a day.  I would not      increase further since he has a low atrial focus on his EKG.  5. Diabetes.  Hemoglobin A1c quarterly.  May  need to change Actos in      the future if he retains fluid.  6. History of reflux, continue Zantac 150 day.  Encourage weight loss      and avoid late-night meals.   I will see him back in 6 months.     Robert Pick. Eden Emms, MD, [facility]  Electronically Signed    PCN/MedQ  DD: 07/01/2007  DT: 07/01/2007  Job #: [id number]

## 2010-09-05 NOTE — Consult Note (Signed)
NAMEEZANA, HUBBERT             ACCOUNT NO.:  [id number]   MEDICAL RECORD NO.:  [id number]          PATIENT TYPE:  INP   LOCATION:  3107                         FACILITY:  [facility]   PHYSICIAN:  Zola Button T. Lazarus Salines, M.D. DATE OF BIRTH:  01/15/1951   DATE OF CONSULTATION:  11/01/2008  DATE OF DISCHARGE:                                 CONSULTATION   CHIEF COMPLAINT:  Difficult intubation.   HISTORY OF PRESENT ILLNESS:  A 60 year old black male, diabetic,  underwent a revision cervical fusion last Wednesday, July 7.  He  developed a postoperative left low neck hematoma causing laryngotracheal  displacement and dyspnea.  He had a surgical reexploration with  evacuation of hematoma.  At the time of that procedure, the intubation  was difficult secondary to edema and displacement.  He has done nicely  for 3 days now and the Orthopedic Service and the Anesthesia Service  together feel like extubation in the OR under observation with ENT  standby.  It is appropriate in case he should be a difficult intubation  once again, or should need a tracheostomy tube.   PHYSICAL EXAMINATION:  He is more or less sedated.  He is wearing a hard  cervical collar.  I did not examine the neck, otherwise.   IMPRESSION:  Difficult intubation with evacuation of hematoma and  stabilized surgical condition.   PLAN:  We will plan extubation under anesthesia in the operating room  with ENT standby, probable direct laryngoscopy at that time to evaluate  the recurrent nerves as possible tomorrow morning.  I discussed this  with the patient and will be glad to discuss with the family as needed.      Gloris Manchester. Lazarus Salines, M.D.  Electronically Signed     KTW/MEDQ  D:  11/01/2008  T:  11/02/2008  Job:  [id number]   cc:   Alvy Beal, MD  Kalman Shan, MD

## 2010-09-05 NOTE — Assessment & Plan Note (Signed)
[facility]                            CARDIOLOGY OFFICE NOTE   JAMICHAEL, KNOTTS                    MRN:          [id number]  DATE:12/18/2007                            DOB:          [birth year]    HISTORY OF PRESENT ILLNESS:  Mr. Yerger returns today for followup.  He  continues to have chest pain.  He is hypertensive and diabetic.  He had  a Myoview in November 2008, which I thought was low risk.   He had some thinning of the inferior wall, but no ischemia.   He continues to have pain particularly at work.  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  The pain can be exertional, but a lot of it is at rest.  He is very anxious about it.   He has not had any prolonged episodes.  He has not taken nitro for it.   Given the fact that the patient is a diabetic and has had recurrent  chest pains, I told him the best course of action would be to have a  heart catheterization.   The patient is in agreement with this.   In regards to his diabetes, it has not been ideally controlled.  He  tends to eat poorly when he leaves work.   He has been otherwise compliant with his medications.   PAST MEDICAL HISTORY:  Otherwise, remarkable for dyspnea, which has been  functional.  His PFTs have shown FEV1 of 1.94 and 2.0.   He has history of anxiety, history of low atrial focus on EKG, diabetes,  and a history of reflux, on H2 blockers.   REVIEW OF SYSTEMS:  Otherwise negative.   FAMILY HISTORY:  Noncontributory.   The patient works as a Investment banker, operational at [employer].  He does not drink or smoke.   CURRENT MEDICATIONS:  1. Metformin 500 b.i.d.  2. Metoprolol 50 a day.  3. Sertraline 50 a day.   PHYSICAL EXAMINATION:  GENERAL:  Remarkable for healthy-appearing black  male in no distress.  Affect is anxious.  VITAL SIGNS:  Weight is 244, blood pressure 140/89, pulse 66 and  regular, respiratory rate 14, afebrile.  HEENT:  Unremarkable.  NECK:  Carotids normal without  bruit, no lymphadenopathy, no  thyromegaly, no JVP elevation.  LUNGS:  Clear.  Good diaphragmatic motion.  No wheezing.  HEART:  S1 and S2, normal heart sounds.  PMI normal.  ABDOMEN:  Benign.  Bowel sounds positive.  No AAA, no tenderness, no  bruit, no hepatosplenomegaly, no hepatojugular reflux, no tenderness.  EXTREMITIES:  Distal pulses are intact.  No edema.  NEURO:  Nonfocal.  SKIN:  Warm and dry.  MUSCULOSKELETAL:  No muscular weakness.   EKG is normal.   IMPRESSION:  1. Recurrent chest pain, diabetic with Myoview study done, March 17, 2007, showing apical thinning and question of a small prior      infarct with no ischemia.  The patient will continue aspirin and      beta-blocker.  Diagnostic heart cath will be performed due to      recurrent symptoms.  2. Diabetes.  Hold metformin prior to cath.  Check hemoglobin A1c with      preop labs.  3. Anxiety.  Continue sertraline.  4. History of reflux.  Continue Zantac.   Further recommendations will be based on results of his heart cath.     Noralyn Pick. Eden Emms, MD, [facility]  Electronically Signed    PCN/MedQ  DD: 12/18/2007  DT: 12/18/2007  Job #: [id number]

## 2010-09-05 NOTE — Discharge Summary (Signed)
[name], [name]             ACCOUNT NO.:  [id number]   MEDICAL RECORD NO.:  [id number]          PATIENT TYPE:  INP   LOCATION:  5037                         FACILITY:  [facility]   PHYSICIAN:  Alvy Beal, MD    DATE OF BIRTH:  [birth year]   DATE OF ADMISSION:  06/24/2008  DATE OF DISCHARGE:  06/25/2008                               DISCHARGE SUMMARY   ADMISSION DIAGNOSIS:  C7 through T1 posterolateral left disk herniation.   DISCHARGE DIAGNOSIS:  C7 through T1 posterolateral left disk herniation.   OPERATIVE PROCEDURE:  Anterior cervical diskectomy and fusion.   CONSULTS:  None.   BRIEF HISTORY:  Mr. Hires is a very pleasant 60 year old gentleman who  was in his usual state of health until he started complaining of some  severe left arm pain.  He described as a pain in the C8 distribution.  In order to ensure that he did not have a peripheral neuropathic  problem, Dr. Shon Baton did do an EMG which was unremarkable for ulnar nerve  entrapment.  After all attempts at conservative management had failed  and MRI and a clinical exam confirmed a C7 through T1 posterolateral  disk herniation with C8 irritation, the decision was made to take him to  the operating room for an anterior cervical diskectomy and fusion.  All  appropriate risks, benefits, and alternatives of surgery were discussed  with the patient and consent was obtained.   HOSPITAL COURSE:  The patient's hospital course was approximately 24  hours in length.  He tolerated the procedure very well, was transferred  from the OR to PACU without incident, and subsequently from the PACU to  the orthopedic floor without incident.  Postoperatively day #1, CT scan  revealed a satisfactory postoperative appearance of the C7 through T1  fusion with no complications identified.  The patient was tolerating a  regular diet, worked very well with physical therapy, no longer had any  radicular type arm complaints.  The patient was  tolerating a regular  diet and voiding on his own.  He had no complaints of chest pain or  shortness of breath and no difficulty swallowing.  After working with  physical therapy, the patient was deemed stable to be discharged home.   DISCHARGE CONDITION:  Stable.   DISPOSITION:  The patient is being discharged to home.   DISCHARGE MEDICATIONS:  The patient is being discharged home on all of  his current home medications which include:  1. Metoprolol 50 mg.  2. Metformin 500 mg.  3. Sertraline 50 mg.  4. Gabapentin 300 mg 3 times a day.  He is instructed to hold his oxycodone and he is being discharged home  on new medication of Percocet 10/325 one tablet p.o. q.6 h p.r.n. pain  and Robaxin 500 mg 1 tablet p.o. q. 8 p.r.n. muscle spasms.   DISCHARGE INSTRUCTION:  The patient was given a preprinted anterior  cervical diskectomy and fusion discharge instruction sheet.  He is to  change his dressings daily.  He is allowed to shower postoperatively day  #5.  He is not to lift anything  heavier than a gallon of milk.  He is to  avoid any overhead activity.  He is to walk as much as possible.  He is  to call the office for increased fevers, chills, redness at the incision  site, increased pain, loss of bowel or bladder function, or new onset of  any radicular type arm pain.  The patient is to schedule his follow up  appointment with Dr. Shon Baton for 2 weeks after his surgical date and he  is to call and schedule the appointment at our office at [phone].      Crissie Reese, PA      Alvy Beal, MD  Electronically Signed    AC/MEDQ  D:  08/12/2008  T:  08/13/2008  Job:  [id number]   cc:   [facility]

## 2010-09-05 NOTE — Assessment & Plan Note (Signed)
[facility]                            CARDIOLOGY OFFICE NOTE   KARRINGTON, STUDNICKA                    MRN:          [id number]  DATE:01/07/2008                            DOB:          [birth year]    PRIMARY CARDIOLOGIST:  Noralyn Pick. Eden Emms, MD, [facility]   This is a 60 year old African American male patient who is here for post  hospital followup.  He has had recurrent chest pain and had an abnormal  Myoview.  Cardiac cath performed on December 31, 2007, revealed  nonobstructive coronary disease with 30% proximal and mid LAD.  Otherwise, coronary is normal with ejection fraction of 65%.  He had  hypertensive heart disease.  No evidence of coronary disease.  His  aortic root will have to be watched either by chest x-ray or possibly  MRA in the future, and his chest pain was felt to be noncardiac.  The  patient had dilated aortic root by echo and on cath showed mild aortic  root dilatation with no evidence of aneurysm or dissection.   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  He said he continues to have chest pain, right groin pain,  and actually called in and was given some Darvocet by Dr. Juanda Chance.  He  said he only gets the chest pain and groin pain when he is at work, but  when he is at home, he is fine.  He admits that it probably all stress-  related and does not know how he can maintain doing his current job.   CURRENT MEDICATIONS:  1. Metformin 500 mg b.i.d.  2. Metoprolol 50 mg daily.  3. Sertraline 50 mg daily.  4. Darvocet 2 daily.   PHYSICAL EXAMINATION:  GENERAL:  This is a anxious 60 year old African  American male in no acute distress.  VITAL SIGNS:  Blood pressure 148/94, pulse 54, weight 242.  NECK:  Without JVD, HJR, bruit, or thyroid enlargement.  LUNGS:  Clear anterior, posterior, and lateral.  HEART:  Regular rate and rhythm at 54 beats per minute.  Normal S1 and  S2.  No murmur, rub, bruit,  thrill, or heave noted.  ABDOMEN:  Soft without organomegaly, masses, lesions, or abnormal  tenderness.  PELVIC:  Right groin, there is no hematoma, hemorrhage, or swelling.  LOWER EXTREMITIES:  Without cyanosis, clubbing, or edema.  He has good  distal pulses.   IMPRESSION:  1. Hypertensive heart disease.  2. Nonobstructive coronary disease on cardiac cath on December 31, 2007.  3. Mildly dilated aortic root.  Will need followup by chest x-ray or      possible MRA in the future.  4. Stress anxiety.   PLAN:  The patient will see Dr. Eden Emms back in 6 months for followup.  He asked for more Darvocet, I told him he can take Advil if he has any  more pain in his right groin.  It looks completely normal and he is  agreeable to this.      Jacolyn Reedy, PA-C  Electronically Signed  Noralyn Pick. Eden Emms, MD, [facility]  Electronically Signed   ML/MedQ  DD: 01/07/2008  DT: 01/08/2008  Job #: [id number]

## 2010-09-05 NOTE — Group Therapy Note (Signed)
NAMENNAMDI, DACUS             ACCOUNT NO.:  [id number]   MEDICAL RECORD NO.:  [id number]          PATIENT TYPE:  INP   LOCATION:  3107                         FACILITY:  [facility]   PHYSICIAN:  Alvy Beal, MD    DATE OF BIRTH:  15-Dec-1950                                 PROGRESS NOTE   I was called at approximately 11:36 a.m. about increasing shortness of  breath.  The patient was satting well.  Rapid response code was called.  The patient was sitting up in bed.  He was alert and oriented x3.  He  just noted increasing pain, swelling on the left side of his neck and  difficulty breathing.  He was satting 96% on room air.   His CT scan from this morning was reviewed.  It did show a large  prevertebral swelling and hematoma with deviation of the trachea.  At  this point in time because of the clinical findings of shortness of  breath, difficulty breathing, and the CT scan showing a hematoma and  then the swelling on the lateral side of the neck, I think it is prudent  that we return to the OR for evacuation of hematoma to identify the  bleeding.  Although in the initial surgery, there was no active bleeding  at the time of closure.  It is well with a normal possibility that he  developed bleeding overnight and started developing an expanding  hematoma.  Given the risk of progressive from shortness of breath to  closure of the windpipe, I think it is prudent to do the evacuation.  I  have discussed this with the patient, he is in agreement.  I have  attempted to call his daughter and father, but I have been unsuccessful.  We will go ahead and plan for the surgery to be done emergently now and  reevaluate him over the weekend.      Alvy Beal, MD  Electronically Signed     DDB/MEDQ  D:  10/29/2008  T:  10/30/2008  Job:  [id number]

## 2010-09-05 NOTE — Op Note (Signed)
NAMEKERIC, ZEHREN             ACCOUNT NO.:  [id number]   MEDICAL RECORD NO.:  [id number]          PATIENT TYPE:  INP   LOCATION:  3107                         FACILITY:  [facility]   PHYSICIAN:  Zola Button T. Lazarus Salines, M.D. DATE OF BIRTH:  18-Oct-1950   DATE OF PROCEDURE:  11/02/2008  DATE OF DISCHARGE:                               OPERATIVE REPORT   PREOPERATIVE DIAGNOSIS:  Laryngeal edema with difficult intubation.   POSTOPERATIVE DIAGNOSIS:  Laryngeal edema with difficult intubation.  Partial laryngeal intubation injury with mobile vocal cords and adequate  airway.   PROCEDURE PERFORMED:  Direct laryngoscopy, extubation under anesthesia.   SURGEON:  Gloris Manchester. Lazarus Salines, MD   ANESTHESIA:  General orotracheal.   BLOOD LOSS:  None.   COMPLICATIONS:  None.   FINDINGS:  A slight mucosal irregularity along the right vocal cord  consistent with an intubation injury.  Some superficial ulceration of  the subglottic posterior commissure consistent with an intubation  injury.  Both vocal cords are mobile.  Minimal edema.  Airway adequate.   PROCEDURE:  With the patient in a comfortable supine position,  anesthesia was continued through an indwelling orotracheal tube.  At an  appropriate level, a rubber tooth guard was applied.  The anterior  commissure laryngoscope was introduced, passed into the pharynx, and  then inserted under the epiglottis into the glottis.  The findings were  as described above.  Under direct observation, anesthesia was lightened  and finally at an appropriate level with corroboration with the  anesthesiologist, the tube was removed.  There was a small amount of  blood and the findings were as described above.  Both vocal cords were  mobile.  A small amount of secretion was suctioned from the larynx and  from the pharynx.  The laryngoscope and tooth guard were removed.  The  patient had some obstructive snoring at the pharynx/base of tongue  level, but no laryngeal  stridor.  This improved as he awakened further.  After approximately 10 minutes of observation, the patient was returned  to Anesthesia, further awakened, transferred to recovery in stable  condition.   COMMENT:  A 60 year old black male with a difficult intubation status  post hematoma as complication from cervical fusion last Friday, 4 days  ago.  Extubation under anesthesia was elected to make sure that he was  not going  to have further airway compromise.  Anticipate routine postoperative  recovery with oral hygiene measures.  He should be on twice daily proton  pump inhibitors for the next several weeks to prevent reflux from  potentially aggravating his intubation injury.      Gloris Manchester. Lazarus Salines, M.D.  Electronically Signed     KTW/MEDQ  D:  11/02/2008  T:  11/03/2008  Job:  [id number]   cc:   Alvy Beal, MD

## 2010-09-08 NOTE — Cardiovascular Report (Signed)
[name], [name]                       ACCOUNT NO.:  [id number]   MEDICAL RECORD NO.:  [id number]                   PATIENT TYPE:  INP   LOCATION:  3733                                 FACILITY:  [facility]   PHYSICIAN:  Madaline Savage, M.D.             DATE OF BIRTH:  Dec 20, 1950   DATE OF PROCEDURE:  08/24/2002  DATE OF DISCHARGE:                              CARDIAC CATHETERIZATION   PROCEDURES PERFORMED:  1. Selective coronary angiography by Judkins technique.  2. Retrograde left heart catheterization.  3. Left ventricular angiography.   CARDIOLOGIST:  Madaline Savage, M.D.   COMPLICATIONS:  None.   ENTRY SITE:  Right femoral.   DYE USED:  Omnipaque.   PATIENT PROFILE:  The patient is a 60 year old African-American gentleman  who entered the hospital on Aug 22, 2002 with complaints of chest pains  occurring at work, and made worse by lifting heavy objects.  He has no  history of known coronary disease.  His subsequent cardiac enzymes have been  negative for myocardial infarction and his EKGs have shown sinus rhythm,  possible old anteroseptal MI, but no acute ischemic change.  The patient was  brought to the cath lab electively today for evaluation of his chest pain.   RESULTS:   PRESSURES:  The left ventricular pressure was 126/8, end-diastolic pressure  was 17.  Central aortic pressure 129/83, mean of 103.  No aortic valve  gradient by pullback technique.   ANGIOGRAPHIC RESULTS:  Coronary Arteries:  The coronary arteries are patent  and normal.   Left Main Coronary Artery:  The left main coronary artery is medium in  length with smooth contours.  No lesions.   Left Anterior Descending:  The LAD courses to the cardiac apex giving rise  to a very large septal perforator branch, which is normal, and a huge  diagonal branch equal in size to the LAD.  No lesions are seen.   Circumflex Artery:  The circumflex gives rise to a bifurcating obtuse  marginal  branch and posterolateral branch, neither of which has any  significant stenotic areas.  There is a small intermediate ramus branch  seen, which is normal.   Right Coronary Artery:  The right coronary artery is large and dominant  giving rise to a posterior descending branch, which bifurcates and a  bifurcating posterolateral branch.  All branches of the RCA are widely  patent throughout.   Left Ventricle:  The left ventricle shows excellent contractility in a 30-  degree RAO projection without any wall motion abnormalities.  There was a  lot of ventricular ectopy.  Ejection fraction estimate is about 55-60%.  No  mitral regurgitation was seen.   FINAL DIAGNOSES:  1. Angiographic patent coronary arteries with a right coronary dominant     system.  2. Normal left ventricular systolic function.   RECOMMENDATIONS:  1. The patient should be reassured.  2. If there are factors  that could be precipitating coronary vasospasm,     those issues should be addressed.                                                 Madaline Savage, M.D.    WHG/MEDQ  D:  08/24/2002  T:  08/24/2002  Job:  [id number]   cc:   Cristy Hilts. Jacinto Halim, M.D.  [address]  [place]  [place] [address]  Fax: [phone]

## 2010-09-08 NOTE — Op Note (Signed)
[name], DUSENBERY                       ACCOUNT NO.:  [id number]   MEDICAL RECORD NO.:  [id number]                   PATIENT TYPE:  EMS   LOCATION:  MAJO                                 FACILITY:  [facility]   PHYSICIAN:  Dionne Ano. Everlene Other, M.D.         DATE OF BIRTH:  October 11, 1950   DATE OF PROCEDURE:  11/20/2002  DATE OF DISCHARGE:  11/20/2002                                 OPERATIVE REPORT   DESCRIPTION OF PROCEDURE:  The patient was brought to the procedural area.  He was given a Marcaine block. Following  this he was prepped and draped in  the usual sterile fashion with Betadine scrub and paint. I then isolated the  sterile field.   I removed his nail plate. Following  the removal of the nail plate, I  irrigated and debrided the wound, the skin and subcutaneous tissue and deep  structures including  the nail bed tissue.   Following this I then defatted the remnant and prepared the distal  tip as a  full thickness  skin graft. I allowed for 3 holes to allow for egress of  fluid and following  preparation of the recipient site and the distal tip, I  then performed a full thickness  skin graft on about a 2 x 1.5 cm area. A  bolster dressing was applied. A combination of 4-0 Prolene, 5-0 Prolene and  4-0 chromic suture was used for this. I then placed Xeroform followed  by  mineral oil soaked cotton balls and a bolster dressing.   The patient tolerated the procedure well. He had a good refill. No  complications were noted. I did perform a nail bed repair with chromic  suture to my satisfaction without difficulty under 4.0 loupe magnification.  The patient tolerated  this well also.   Following  this I then cleansed  the arm and placed the patient in a finger  splint. He tolerated  this well without complicating features.   I discharged him hom on  Robaxin and Vicodin  for muscle spasm and pain  relief. I also placed him on Keflex 500 mg 1 p.o.  q.i.d. x7 days and will  see him in the office in 7 to 9 days for follow up. I have asked him to be  out of work until that time due to his occupation and the need for  elevation, using narcotic medicine and keeping the area clean and dry. We  have discussed dos, don'ts, etc., and all questions have been encouraged and  answered. We will look forward to his postoperative care.                                               Dionne Ano. Everlene Other, M.D.    Nash Mantis  D:  11/20/2002  T:  11/21/2002  Job:  [id number]

## 2010-09-08 NOTE — Discharge Summary (Signed)
   [name], [name]                       ACCOUNT NO.:  [id number]   MEDICAL RECORD NO.:  [id number]                   PATIENT TYPE:  INP   LOCATION:  3733                                 FACILITY:  [facility]   PHYSICIAN:  Cristy Hilts. Jacinto Halim, M.D.                  DATE OF BIRTH:  1950/11/09   DATE OF ADMISSION:  08/22/2002  DATE OF DISCHARGE:  08/25/2002                                 DISCHARGE SUMMARY   DISCHARGE DIAGNOSES:  1. Chest pain, myocardial infarction ruled out, normal coronaries with     catheterization this admission with good left ventricular function.  2. Hypertension.  3. History of cocaine use.   HOSPITAL COURSE:  The patient is a 60 year old male who comes to the  emergency room  with chest pain.  He had an episode on the day of admission  with shortness of breath.  He did have a history of cocaine use on the  previous day.  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  He is on no  medications.  He was on a blood pressure medicine, but stopped this.  Past  medical history is remarkable for elevated blood sugar in the past although  he is not on medicine for this.  The patient was admitted to telemetry.  Initial CK was elevated at 299, but troponins were negative.  He was started  on IV heparin and set up for diagnostic catheterization.  This was done  08/24/02 by Dr. Elsie Lincoln which revealed normal coronaries and normal LV  function. We feel the patient can be discharged 08/25/02.  He can follow up  with his primary care doctor.  He is to schedule an appointment to be seen  at [facility].  We did discharge him on Norvasc 5 mg daily.   LABORATORY DATA:  Sodium 141, potassium 3.8, BUN 5, creatinine 1.0, white  count 4.4, hemoglobin 13.6, hematocrit 40.1, platelets 276, INR 1.1,  hemoglobin A1c 6.3, CKs were elevated but MB and troponins are negative.  Cholesterol 187, HDL 47, LDL 124, TSH 1.106.  Drug screening was positive  for cocaine.  Chest x-ray shows no active  disease.  EKG reveals sinus rhythm  with Q wave in V2 and V3.   DISPOSITION:  The patient is discharged in stable condition and will follow  up with our group on a p.r.n.  basis.  He is to schedule an appointment with  a primary care doctor at [facility] where he has been seen before.     Abelino Derrick, P.A.                      Cristy Hilts. Jacinto Halim, M.D.    Lenard Lance  D:  08/25/2002  T:  08/26/2002  Job:  [id number]   cc:   [facility]

## 2010-09-08 NOTE — Assessment & Plan Note (Signed)
[name] [facility]                            CARDIOLOGY OFFICE NOTE   [name], [name]                    MRN:          [id number]  DATE:04/09/2006                            DOB:          [birth year]    ADDENDUM   Looking back through [name]'s chart here at [facility], he has had an  endoscopy this past summer with Dr. Victorino Dike.  He had chronic  gastritis.  We will eliminate the GI referral at this point.     Thomas C. Daleen Squibb, MD, [facility]     TCW/MedQ  DD: 04/09/2006  DT: 04/09/2006  Job #: [id number]   cc:   Arta Silence, MD

## 2010-09-08 NOTE — Op Note (Signed)
[name], [name]             ACCOUNT NO.:  [id number]   MEDICAL RECORD NO.:  [id number]          PATIENT TYPE:  AMB   LOCATION:  DSC                          FACILITY:  [facility]   PHYSICIAN:  Tennis Must Meyerdierks, M.D.DATE OF BIRTH:  07/11/1950   DATE OF PROCEDURE:  12/12/2005  DATE OF DISCHARGE:                                 OPERATIVE REPORT   PREOPERATIVE DIAGNOSIS:  Trigger finger right long finger.   POSTOPERATIVE DIAGNOSIS:  Trigger finger right long finger.   PROCEDURE:  Release of A1 pulley, right long finger.   SURGEON:  Dr. Era Bumpers.   ANESTHESIA:  Marcaine 0.5% local with sedation.   OPERATIVE FINDINGS:  The patient had some mild shredding of the flexor  tendons beneath the A1 pulley.  There were no nodules.   PROCEDURE:  Under 0.5% Marcaine local anesthesia, with a tourniquet on the  right arm, the right hand was prepped and draped in usual fashion, and after  explaining the limb, the tourniquet was inflated to 250 mmHg.  An oblique  incision was made in line with the long finger, in the distal palmar crease.  Sharp dissection was carried through the subcutaneous tissues.  Blunt  dissection was carried down to the flexor sheath and care was taken to  protect the neurovascular bundles.  The A1 pulley was incised sharply.  It  was then released completely with the scissors.  The finger was brought  through full range of motion and there was no further triggering.  The wound  was irrigated with saline.  The skin was closed with 4-0 nylon sutures.  Sterile dressings were applied.  The patient tolerated the procedure well,  and went to the recovery room, awake, stable and in good condition.      [name], M.D.  Electronically Signed     EMM/MEDQ  D:  12/13/2005  T:  12/13/2005  Job:  [id number]

## 2010-09-08 NOTE — H&P (Signed)
[name], [name]                       ACCOUNT NO.:  [id number]   MEDICAL RECORD NO.:  [id number]                   PATIENT TYPE:  EMS   LOCATION:  MAJO                                 FACILITY:  [facility]   PHYSICIAN:  Dionne Ano. Everlene Other, M.D.         DATE OF BIRTH:  [birth year]   DATE OF ADMISSION:  DATE OF DISCHARGE:  11/20/2002                                HISTORY & PHYSICAL   HISTORY OF PRESENT ILLNESS:  I had the pleasure to see [name] in  the emergency room. This patient is a pleasant 60 year old male who was at  work today when a sharp object lacerated his left index finger. He sustained  a partial amputation of the left index finger  and presents for further  care. The patient and I have discussed all issues. I actually know Mr.  Althaus from prior visits in my office. The patient and I have discussed his  injury and upper extremity predicament at length. His tetanus and  antibiotics have been attended to by the emergency room staff and I have  been asked to see him. He denies other injury. The patient did have the  injury occur with a sharp blade.   ALLERGIES:  None.   MEDICATIONS:  None.   PAST SURGICAL HISTORY:  None.   PAST MEDICAL HISTORY:  Hypertension and hypercholesteremia for which he is  not on medicine.   SOCIAL HISTORY:  He does not smoke or drink. He works at [employer].   PHYSICAL EXAMINATION:  GENERAL:  He is alert and oriented and in no acute  distress.  VITAL SIGNS:  Stable.  HEENT:  Examination is negative. Within normal limits.  CHEST:  He has equal chest expansion.  HEART:  Regular rate.  ABDOMEN:  Nontender.  EXTREMITIES:  His left upper extremity has a 2-cm distal amputation about  the radial and volar aspect. This does involve the nail bed and nail plate.  The patient and  I have discussed this at length and I have reviewed his  examination. He has no injury to the middle, ring or small  finger. He  denies wrist, elbow,  forearm, shoulder or cervical pain. The patient has the  tip with him and I have reviewed this today.   LABORATORY DATA:  X-rays were reviewed which showed no significant fracture.  I have reviewed the x-rays at length and all findings.   IMPRESSION:  Distal tip amputation, left index finger secondary to a slicing  injury.    PLAN:  I have already consented him for irrigation and debridement and  repair of the structure if necessary. He understands the risks and benefits  and desires to proceed.  Dionne Ano. Everlene Other, M.D.    Nash Mantis  D:  11/20/2002  T:  11/21/2002  Job:  [id number]

## 2010-09-08 NOTE — Assessment & Plan Note (Signed)
[facility]                            CARDIOLOGY OFFICE NOTE   [name], [name]                    MRN:          [id number]  DATE:04/09/2006                            DOB:          May 17, 1950    I was asked by Dr. Hetty Ely to evaluate [name] with chest  pressure and tightness and shortness of breath.   [name] is 60 years of age, single, and has been evaluated for chest  discomfort in the past.  He had a cardiac catheterization by Dr. Jacinto Halim  at [facility].  It showed normal coronary arteries with a normal left  ventricular function.  He was using cocaine at the time.   He has been free of cocaine he says for 2-3 years.   He has gained a substantial amount of weight and is now a new onset  diabetic.  He is on Actos.   He has been having chest pressure and tightness, sometimes going in to  his left arm.  This is clearly not always exertion related.  In fact, it  happens most commonly at night when he is lying down or laying on his  left side.  He also gives a history of having trouble with food sticking  under his sternum.  He has some minimal reflux symptoms.  He had no  melena, hematochezia, or change in bowel habits.   PAST MEDICAL HISTORY:  He is on:  1. Actos 30 mg daily.  2. He has nitroglycerin p.r.n.  3. He is not taking aspirin.   He has no known drug allergies.  He has no dye reactions.   He currently does not smoke, does not drink, does not use any  recreational drugs.   He has had a tonsillectomy in the past.   FAMILY HISTORY:  Full of diabetes and some kidney failure issues.  There  is no premature coronary disease.   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  He is  around food all the time which makes losing weight difficult.  He is  divorced.  He has 4 children.   REVIEW OF SYMPTOMS:  Other than allergies, hayfever, an ulcer in the  1980s, and the HPI, negative across the  board.   EXAMINATION:  He is very pleasant and gregarious.  His blood pressure is 122/80.  Heart rate 77.  He is 5 feet 7 inches.  Weighs 240 pounds.  He says he has lost 10 recently.  HEENT:  Normocephalic, atraumatic.  He has a moustache.  PERRLA.  Extraocular movements intact. Sclerae are clear.  Facial symmetry is  normal.  Dentition is satisfactory.  Carotid upstrokes were equal bilaterally without bruits.  There is no  JVD.  Thyroid is not enlarged.  Trachea is midline.  LUNGS:  Clear to auscultation.  HEART:  Soft S1, S2.  PMI is poorly appreciated.  ABDOMEN:  Protuberant with good bowel sounds.  There is no obvious  organomegaly.  There is no tenderness.  Bowel sounds are present.  EXTREMITIES:  No cyanosis, clubbing, or edema.  Pulses are  brisk.  NEURO:  Intact.  MUSCULOSKELETAL:  Intact.  SKIN:  Intact.   His electrocardiogram today shows normal sinus rhythm and nonspecific ST  segment changes.   ASSESSMENT AND PLAN:  [name]'s chest discomfort is worrisome for  coronary ischemia.  However, he had a normal catheterization 3 years  ago.  At the time, he was doing some cocaine but is not now.   New onset diabetes, obesity, and lack of regular exercise.  He does have  major risk factors.  He at least needs an objective assessment with a  Myoview.   RECOMMENDATIONS:  1. Rest, exercise Myoview.  2. GI referral for dysphagia.  3. Add aspirin 325 mg daily.  4. Ask Dr. Hetty Ely to consider a statin if not already done so.  I do      not have any fasting blood work and we will leave this to his      expertise.  5. The patient advised to lose as much weight as possible.  This will      hopefully solve his elevated blood sugars.   ADDENDUM  Looking back through [name]'s chart here at [facility], he has had an  endoscopy this past summer with Dr. Victorino Dike.  He had chronic  gastritis.  We will eliminate the GI referral at this point.     Thomas C. Daleen Squibb, MD, [facility]   Electronically Signed    TCW/MedQ  DD: 04/09/2006  DT: 04/09/2006  Job #: [id number]   cc:   Arta Silence, MD

## 2010-09-12 ENCOUNTER — Other Ambulatory Visit: Payer: Self-pay

## 2010-09-15 ENCOUNTER — Ambulatory Visit: Payer: Self-pay | Admitting: Family Medicine

## 2010-10-21 ENCOUNTER — Telehealth: Payer: Self-pay | Admitting: *Deleted

## 2010-10-21 MED ORDER — GLIPIZIDE-METFORMIN HCL 5-500 MG PO TABS: 1.0000 | ORAL_TABLET | Freq: Two times a day (BID) | ORAL | Status: DC

## 2010-10-21 NOTE — Telephone Encounter (Signed)
Refill sent to pharmacy as instructed. 

## 2010-10-21 NOTE — Telephone Encounter (Signed)
He should be on 1 tab bid.  Please send in Glipizide/Metformin 5-500, 1 po bid.  #[phone] and [phone].  Thanks.

## 2010-10-21 NOTE — Telephone Encounter (Signed)
Received refill request from pharmacy for Glipizide/Metformin 5-500, take one by mouth in the morning and 4 by mouth in the evening. This does not match the med sheet. Please advise.

## 2010-10-23 ENCOUNTER — Other Ambulatory Visit: Payer: Self-pay

## 2010-10-24 ENCOUNTER — Other Ambulatory Visit (INDEPENDENT_AMBULATORY_CARE_PROVIDER_SITE_OTHER): Payer: Self-pay | Admitting: *Deleted

## 2010-10-24 ENCOUNTER — Telehealth: Payer: Self-pay | Admitting: *Deleted

## 2010-10-24 DIAGNOSIS — D509 Iron deficiency anemia, unspecified: Secondary | ICD-10-CM

## 2010-10-24 DIAGNOSIS — I10 Essential (primary) hypertension: Secondary | ICD-10-CM

## 2010-10-24 DIAGNOSIS — E7800 Pure hypercholesterolemia, unspecified: Secondary | ICD-10-CM

## 2010-10-24 DIAGNOSIS — E119 Type 2 diabetes mellitus without complications: Secondary | ICD-10-CM

## 2010-10-24 DIAGNOSIS — Z125 Encounter for screening for malignant neoplasm of prostate: Secondary | ICD-10-CM

## 2010-10-24 LAB — CBC WITH DIFFERENTIAL/PLATELET
Basophils Absolute: 0.1 10*3/uL (ref 0.0–0.1)
HCT: 37.4 % — ABNORMAL LOW (ref 39.0–52.0)
Hemoglobin: 12.6 g/dL — ABNORMAL LOW (ref 13.0–17.0)
Lymphs Abs: 2.4 10*3/uL (ref 0.7–4.0)
MCHC: 33.8 g/dL (ref 30.0–36.0)
MCV: 92 fl (ref 78.0–100.0)
Monocytes Absolute: 0.8 10*3/uL (ref 0.1–1.0)
Monocytes Relative: 12.9 % — ABNORMAL HIGH (ref 3.0–12.0)
Neutro Abs: 2.8 10*3/uL (ref 1.4–7.7)
Platelets: 284 10*3/uL (ref 150.0–400.0)
RDW: 15.3 % — ABNORMAL HIGH (ref 11.5–14.6)

## 2010-10-24 LAB — HEPATIC FUNCTION PANEL
Albumin: 4 g/dL (ref 3.5–5.2)
Bilirubin, Direct: 0.1 mg/dL (ref 0.0–0.3)
Total Protein: 7.1 g/dL (ref 6.0–8.3)

## 2010-10-24 LAB — BASIC METABOLIC PANEL
CO2: 28 mEq/L (ref 19–32)
Calcium: 9.2 mg/dL (ref 8.4–10.5)
Creatinine, Ser: 1.1 mg/dL (ref 0.4–1.5)
GFR: 86.7 mL/min (ref >60.00)

## 2010-10-24 LAB — LIPID PANEL
HDL: 36.8 mg/dL — ABNORMAL LOW (ref >39.00)
Total CHOL/HDL Ratio: 5
Triglycerides: 129 mg/dL (ref 0.0–149.0)
VLDL: 25.8 mg/dL (ref 0.0–40.0)

## 2010-10-24 LAB — HEMOGLOBIN A1C: Hgb A1c MFr Bld: 7.9 % — ABNORMAL HIGH (ref 4.6–6.5)

## 2010-10-24 LAB — PSA: PSA: 1 ng/mL (ref 0.10–4.00)

## 2010-10-24 NOTE — Telephone Encounter (Signed)
Refill called to pharmacy for Glipizide-Metformin because it printed instead of going electronically.

## 2010-10-26 ENCOUNTER — Ambulatory Visit (INDEPENDENT_AMBULATORY_CARE_PROVIDER_SITE_OTHER): Payer: Self-pay | Admitting: Family Medicine

## 2010-10-26 ENCOUNTER — Encounter: Payer: Self-pay | Admitting: Family Medicine

## 2010-10-26 DIAGNOSIS — J310 Chronic rhinitis: Secondary | ICD-10-CM

## 2010-10-26 DIAGNOSIS — E7800 Pure hypercholesterolemia, unspecified: Secondary | ICD-10-CM

## 2010-10-26 DIAGNOSIS — E119 Type 2 diabetes mellitus without complications: Secondary | ICD-10-CM

## 2010-10-26 DIAGNOSIS — Z0000 Encounter for general adult medical examination without abnormal findings: Secondary | ICD-10-CM | POA: Insufficient documentation

## 2010-10-26 MED ORDER — FLUTICASONE PROPIONATE 50 MCG/ACT NA SUSP: 2.0000 | Freq: Every day | NASAL | Status: DC

## 2010-10-26 NOTE — Assessment & Plan Note (Signed)
Improving, continue to work on weight/exercise/diet.

## 2010-10-26 NOTE — Patient Instructions (Addendum)
Check A1c in 6 months with OV a few days later. Don't change your regular meds for now.  Keep working on your weight.  Thank you for your effort. I would try nasal saline for your nose.  If that doesn't help, try plain claritin (loratadine) 10mg  a day.  If that doesn't help, add on flonase (2 sprays per nostril).   Check with your insurance to see if they will cover the shingles shot. I would get a flu shot each fall.

## 2010-10-26 NOTE — Assessment & Plan Note (Signed)
Healthy habits d/w pt, diet/exercise/weight.  Flu shot in fall. Tetanus up to date. Pt to check on shingles.  Prev with colonoscopy done.  Prostate exam wnl, PSA wnl.

## 2010-10-26 NOTE — Progress Notes (Signed)
Phys- prev med d/w pt.  See plan.  Diabetes:  He has been working on diet.   Using medications without difficulties:yes Hypoglycemic episodes:no Hyperglycemic episodes:no Feet problems:no Blood Sugars averaging: ~100  Cough worse at night.  Sniffles and runny nose are the likely cause, ie postnasal gtt.  No fevers.  Occ sweats at night and occ during the day, but his is irregular and episodic.  rhinorrhea is disrupting his sleep.    PMH and SH reviewed  Meds, vitals, and allergies reviewed.   ROS: See HPI.  Otherwise negative.    GEN: nad, alert and oriented HEENT: mucous membranes moist, tm wnl nasal exam with clear discharge. Op wnl NECK: supple w/o LA CV: rrr. PULM: ctab, no inc wob ABD: soft, +bs EXT: no edema SKIN: no acute rash Prostate gland firm and smooth, no enlargement, nodularity, tenderness, mass, asymmetry or induration.  Diabetic foot exam: Normal inspection No skin breakdown No calluses  Normal DP pulses Normal sensation to light touch and monofilament Nails normal

## 2010-10-26 NOTE — Assessment & Plan Note (Signed)
On less medicine with fewer lows.  Continue to work on diet/exericse.  Recheck A1c 6 months.  Labs d/w pt.

## 2010-10-26 NOTE — Assessment & Plan Note (Signed)
Nasal saline then clartin then flonase and f/u prn.

## 2010-11-08 ENCOUNTER — Other Ambulatory Visit: Payer: Self-pay | Admitting: *Deleted

## 2010-11-08 MED ORDER — CILIDINIUM-CHLORDIAZEPOXIDE 2.5-5 MG PO CAPS: 1.0000 | ORAL_CAPSULE | Freq: Three times a day (TID) | ORAL | Status: DC | PRN

## 2010-11-08 MED ORDER — GLIPIZIDE-METFORMIN HCL 5-500 MG PO TABS: 1.0000 | ORAL_TABLET | Freq: Every day | ORAL | Status: DC

## 2010-11-08 NOTE — Telephone Encounter (Signed)
I'm not in the office until later.  Please call pt and get some details in the meantime.

## 2010-11-08 NOTE — Telephone Encounter (Signed)
Patient says that he was told to call you with an update with something he had talked with you about at his last appt. He said that it is personal and didn't want to talk with me. He is asking if you could call him.

## 2010-11-08 NOTE — Telephone Encounter (Signed)
Spoke with patient. He said he wanted to talk to you about the Zostavax that was dicussed at his earlier appointment. He wouldn't give me further details.   He also said his sugar seems to be dropping. He said he is having trouble keeping it in his normal range. He says he will eat and then check his sugar about an hour later and it will be lower than it was before he ate. He also said he has had some lows (77 and ~ 65). He was able to keep it from getting lower, but he was concerned because it has just started doing this.   I told him I would let you know and we would give him a call.

## 2010-11-08 NOTE — Telephone Encounter (Signed)
I called pt.  He has had some hypoglycemia.  I advised him to cut back to 1 a day on him DM2 meds and notify the clinic if he has more trouble (high or low).  He wanted to get the zostavax.  Please call him and set up a RN visit for this.  Thanks.

## 2010-11-08 NOTE — Telephone Encounter (Signed)
Received faxed refill request from pharmacy. Is it okay to refill this medication? Please verify dispense amount and number of refills. 

## 2010-11-09 NOTE — Telephone Encounter (Signed)
Nurse visit scheduled for zostavax.

## 2010-11-10 ENCOUNTER — Ambulatory Visit: Payer: Self-pay

## 2010-12-11 ENCOUNTER — Ambulatory Visit: Payer: Self-pay | Admitting: Family Medicine

## 2010-12-15 ENCOUNTER — Encounter: Payer: Self-pay | Admitting: Family Medicine

## 2010-12-15 ENCOUNTER — Ambulatory Visit (INDEPENDENT_AMBULATORY_CARE_PROVIDER_SITE_OTHER): Payer: Self-pay | Admitting: Family Medicine

## 2010-12-15 VITALS — BP 110/80 | HR 72 | Temp 98.3°F | Wt 244.0 lb

## 2010-12-15 DIAGNOSIS — Z2911 Encounter for prophylactic immunotherapy for respiratory syncytial virus (RSV): Secondary | ICD-10-CM

## 2010-12-15 DIAGNOSIS — Z23 Encounter for immunization: Secondary | ICD-10-CM

## 2010-12-15 DIAGNOSIS — R609 Edema, unspecified: Secondary | ICD-10-CM

## 2010-12-15 DIAGNOSIS — D179 Benign lipomatous neoplasm, unspecified: Secondary | ICD-10-CM

## 2010-12-15 MED ORDER — FUROSEMIDE 20 MG PO TABS: 10.0000 mg | ORAL_TABLET | Freq: Every day | ORAL | Status: DC | PRN

## 2010-12-15 MED ORDER — LISINOPRIL 10 MG PO TABS: 5.0000 mg | ORAL_TABLET | Freq: Every day | ORAL | Status: DC

## 2010-12-15 NOTE — Patient Instructions (Signed)
See Shirlee Limerick about your referral before your leave today. Cut the lisinopril in half, cut back on salt, elevate you feet as needed, and take the lasix only if you still have swelling.   Take care.

## 2010-12-15 NOTE — Assessment & Plan Note (Addendum)
Limit salt, elevate legs, and use lasix sparingly.  He understood.  Decrease ACE as he was mildly orthostatic.

## 2010-12-15 NOTE — Assessment & Plan Note (Signed)
With some likely compression of nerve in R arm.  I would like surgery to see the patient about this.  D/w pt and he understood.

## 2010-12-15 NOTE — Progress Notes (Signed)
BLE edema.  Intermittent, not every day.  Occ shoes feel tight.  His dad has some fluids pills (he thinks lasix), and he took some of them.  It helped the swelling.  Had been going on for about 1 year.  Occ orthostatic sx.    Wants zostavax.    Lipoma on R forearm. With compression he gets numb/pain sensation that goes down to the R 2nd digit.    Meds, vitals, and allergies reviewed.   ROS: See HPI.  Otherwise, noncontributory.  GEN: nad, alert and oriented NECK: supple w/o LA CV: rrr PULM: ctab, no inc wob ABD: soft, +bs EXT: no edema SKIN: no acute rash but likely lipoma on R forearm. Distally nv intact.

## 2010-12-19 ENCOUNTER — Telehealth: Payer: Self-pay | Admitting: *Deleted

## 2010-12-19 NOTE — Telephone Encounter (Signed)
Pt called to let you know that he is doing well with his fluid pill, feeling good.

## 2010-12-19 NOTE — Telephone Encounter (Signed)
Noted  

## 2010-12-29 ENCOUNTER — Encounter (INDEPENDENT_AMBULATORY_CARE_PROVIDER_SITE_OTHER): Payer: Self-pay | Admitting: General Surgery

## 2010-12-29 ENCOUNTER — Encounter (INDEPENDENT_AMBULATORY_CARE_PROVIDER_SITE_OTHER): Payer: Self-pay | Admitting: Surgery

## 2010-12-29 ENCOUNTER — Ambulatory Visit (INDEPENDENT_AMBULATORY_CARE_PROVIDER_SITE_OTHER): Payer: Medicare Other | Admitting: General Surgery

## 2010-12-29 VITALS — BP 108/70 | HR 76 | Temp 96.7°F | Ht 68.0 in | Wt 236.0 lb

## 2010-12-29 DIAGNOSIS — D179 Benign lipomatous neoplasm, unspecified: Secondary | ICD-10-CM

## 2010-12-29 NOTE — Progress Notes (Signed)
Chief Complaint  Patient presents with  . Lipoma    HPI [name] is a 60 y.o. male. HPI 60 year old African American male referred for evaluation of a right forearm lipoma. The patient states it has been there for about 4 years. However over the past several months has been causing him more discomfort. He thinks it is also gotten slightly larger. He denies any weight loss. He denies any injury to the area. He denies any other lumps or bumps. He denies any family history of soft tissue cancers. He specifically complains of pain from his right elbow joint down to his right index finger. He is interested in surgical excision   Past Medical History  Diagnosis Date  . Chest pain   . GERD (gastroesophageal reflux disease)   . Hyperlipidemia   . Hypertension     Essential  . Anxiety   . Depression   . Stroke   . Diabetes mellitus     Type II  . Hand pain     Right long finger and mid palm  . Calcific tendonitis   . Dyspnea   . Foot pain, right   . Esophageal stricture   . Muscle spasm   . Orchitis and epididymitis, unspecified   . Back pain     with radiculopathy  . Circadian rhythm sleep disorder, irregular sleep-wake type   . Abdominal wall hernia   . Cocaine abuse     Past Surgical History  Procedure Date  . Tonsillectomy childhood  . Carpal tunnel release     Right  . Cardiac catheterization 5/04  . Trigger finger release     Right long finger  . Inguinal hernia repair     bilateral, 5 cm. RLQ Abd Lipoma Excision (Dr. Ezzard Standing)  . Esophagogastroduodenoscopy 07/17/07    Esoph. Stricture dilated (Dr. Marina Goodell)  . Anterior cervical discectomy 10/28/2008    with fusion (Dr. Shon Baton)  . Hematoma evacuation 10/29/2008    (Dr. Shon Baton)  . Direct laryngoscopy 11/02/2008    Extubation under anesthesia (Dr. Lazarus Salines)    Family History  Problem Relation Age of Onset  . Diabetes Mother   . Hypertension Mother   . Hyperlipidemia Mother   . Diabetes Father   . Diabetes  Sister   . Hypertension Sister   . Hypertension Brother   . Diabetes Brother   . Heart disease Maternal Uncle     Heart failure  . Cancer Paternal Uncle     Prostate  . Cancer Paternal Grandfather     Prostate  . Stroke Neg Hx   . Diabetes Brother   . Hypertension Brother   . Diabetes Brother     Social History History  Substance Use Topics  . Smoking status: Never Smoker   . Smokeless tobacco: Not on file  . Alcohol Use: Yes     Occasionally    No Known Allergies  Current Outpatient Prescriptions  Medication Sig Dispense Refill  . CINNAMON PO Take by mouth daily.        . Aspirin-Caffeine (BC FAST PAIN RELIEF ARTHRITIS PO) Take by mouth as needed.        . clidinium-chlordiazePOXIDE (LIBRAX) 2.5-5 MG per capsule Take 1 capsule by mouth 3 (three) times daily as needed.  90 capsule  5  . fluticasone (FLONASE) 50 MCG/ACT nasal spray Place 2 sprays into the nose daily.  16 g  2  . furosemide (LASIX) 20 MG tablet Take 0.5 tablets (10 mg total) by mouth daily  as needed (edema, take as infrequently as possible).  30 tablet  1  . glipiZIDE-metformin (METAGLIP) 5-500 MG per tablet Take 1 tablet by mouth daily.  180 tablet  3  . glucose blood (ONE TOUCH ULTRA TEST) test strip Check blood sugar as directed.       Marland Kitchen lisinopril (PRINIVIL,ZESTRIL) 10 MG tablet Take 0.5 tablets (5 mg total) by mouth daily.      . metoprolol (TOPROL-XL) 50 MG 24 hr tablet Take 50 mg by mouth daily.        . sucralfate (CARAFATE) 1 GM/10ML suspension Take 1 g by mouth 4 (four) times daily as needed.        . traMADol (ULTRAM) 50 MG tablet Take 50 mg by mouth every 6 (six) hours as needed.          Review of Systems Review of Systems  Constitutional: Negative for fever, chills, activity change and appetite change.  HENT: Positive for neck pain (right sided) and dental problem (lower dentures).   Eyes: Negative for photophobia and visual disturbance.  Respiratory: Negative for apnea and wheezing.     Cardiovascular: Positive for leg swelling (occassionally, on lasix, not a problem recently).       Denies CP, +DOE, no orthopnea, no PND  Gastrointestinal: Negative for abdominal pain, blood in stool and abdominal distention.  Genitourinary: Negative for dysuria and hematuria.  Musculoskeletal:       See hpi  Skin: Negative.   Neurological: Positive for dizziness (some occassional ). Negative for seizures and syncope.       No TIA, no amaurosis fugax  Hematological: Negative.   Psychiatric/Behavioral: Negative.     Blood pressure 108/70, pulse 76, temperature 96.7 F (35.9 C), temperature source Temporal, height 5\' 8"  (1.727 m), weight 236 lb (107.049 kg).  Physical Exam Physical Exam  Vitals reviewed. Constitutional: He is oriented to person, place, and time. He appears well-developed and well-nourished.       obese  HENT:  Head: Normocephalic and atraumatic.  Eyes: Conjunctivae are normal. No scleral icterus.  Neck: Normal range of motion. Neck supple. No JVD present. No thyromegaly present.  Cardiovascular: Normal rate, regular rhythm and normal heart sounds.   Pulmonary/Chest: Effort normal and breath sounds normal. No respiratory distress. He has no wheezes.  Abdominal: Soft. Bowel sounds are normal. He exhibits no distension. There is no guarding.       Small umbilical fascial defect, about 0.5cm  Musculoskeletal: Normal range of motion. He exhibits no edema.  Lymphadenopathy:    He has no cervical adenopathy.  Neurological: He is alert and oriented to person, place, and time. He exhibits normal muscle tone.  Skin: No rash noted. No erythema.       Right mid anterior-medial forearm, well circumscribed subcu mass, about 2 x 2cm, soft, no overlying sking changes.   Psychiatric: He has a normal mood and affect. His behavior is normal. Judgment and thought content normal.    Data Reviewed Labs from July 2012  Assessment    Right forearm lipoma DM  II HTN Dyslipidemia H/o CVA Obesity     Plan    We discussed the etiology of lipomas & their management.  We discussed observation vs surgical excision.  The patient desires surgical excision.  Given his medical comorbidities, I have recommended excision using local analgesia only.  I discussed the risk and benefits of surgery including but not limited to bleeding, infection, scarring, injury to surrounding structures, hematoma or seroma formation.  We discussed the typical postoperative recovery course. I also explained to him that I could not guarantee that excision of this lipoma would ameliorate his right arm pain.  We will plan to excise the right forearm lipoma using local analgesia only.   Mary Sella. Andrey Campanile, MD   Gaynelle Adu M 12/29/2010, 1:56 PM

## 2010-12-29 NOTE — Patient Instructions (Signed)

## 2011-01-01 ENCOUNTER — Ambulatory Visit: Payer: Self-pay | Admitting: Family Medicine

## 2011-01-04 ENCOUNTER — Ambulatory Visit (HOSPITAL_BASED_OUTPATIENT_CLINIC_OR_DEPARTMENT_OTHER)
Admission: RE | Admit: 2011-01-04 | Discharge: 2011-01-04 | Disposition: A | Discharge status: Home / Self Care | Payer: Medicare Other | Source: Ambulatory Visit | Attending: General Surgery | Admitting: General Surgery

## 2011-01-04 ENCOUNTER — Other Ambulatory Visit (INDEPENDENT_AMBULATORY_CARE_PROVIDER_SITE_OTHER): Payer: Self-pay | Admitting: General Surgery

## 2011-01-04 DIAGNOSIS — D1739 Benign lipomatous neoplasm of skin and subcutaneous tissue of other sites: Secondary | ICD-10-CM

## 2011-01-05 NOTE — Op Note (Signed)
  [name], [name]             ACCOUNT NO.:  [id number]  MEDICAL RECORD NO.:  [id number]  LOCATION:                                 FACILITY:  PHYSICIAN:  Mary Sella. Andrey Campanile, MD     DATE OF BIRTH:  1951/04/22  DATE OF PROCEDURE:  01/04/2011 DATE OF DISCHARGE:                              OPERATIVE REPORT   PREOPERATIVE DIAGNOSIS:  Right forearm lipoma.  POSTOPERATIVE DIAGNOSIS:  Right forearm lipoma.  PROCEDURE:  Excision of right forearm lipoma with a two-layer closure.  SURGEON:  Mary Sella. Andrey Campanile, MD.  ASSISTANT SURGEON:  None.  ANESTHESIA:  Local consisting of 0.25% Marcaine with epi mixed with sodium bicarbonate 10 mL was used.  SPECIMEN:  Right forearm lipoma.  ESTIMATED BLOOD LOSS:  Minimal.  INDICATIONS FOR PROCEDURE:  The patient is a 60 year old African American male, referred by Dr. Crawford Givens for a right forearm lipoma. It has been there for about 4 years.  Over the past several months, it is causing him more discomfort.  He desired surgical excision.  We discussed the risks and benefits of surgery including but not limited to bleeding, infection, injury to surrounding structures, possible need for additional procedures, hematoma formation, seroma formation, and scarring.  He elect to proceed to the operating room.  DESCRIPTION OF PROCEDURE:  The patient was taken to the minor procedure room at [facility].  A surgical time-out was performed.  The right arm and the operation site had been marked and confirmed with the patient confirming the operative site.  His right arm was prepped and draped in usual standard surgical fashion with ChloraPrep.  The size of the lipoma was about 2 cm x 2 cm.  I infiltrated 10 mL of local in a regional fashion around the area.  I then made an 1-inch incision directly over the lesion with a #15 blade. I then raised skin flaps both medially and lateral with a fine hemostat. The lipoma essentially  popped out.  It did have one or two loose attachments that were taken down with electrocautery.  The lipoma was excised and put on the specimen cup.  I then closed the wound in two layers.  Interrupted 3-0 Vicryl in the deep dermis and a running 4-0 Monocryl in subcuticular fashion followed by application of Benzoin, Steri-Strips, 2x2, and Tegaderm.  The patient tolerated the procedure well.  There were no immediate complications.  I will see him in 2 weeks.     Mary Sella. Andrey Campanile, MD     EMW/MEDQ  D:  01/04/2011  T:  01/04/2011  Job:  [id number]  cc:   Dwana Curd. Para March, M.D.  Electronically Signed by Gaynelle Adu M.D. on 01/05/2011 09:50:49 AM

## 2011-01-10 ENCOUNTER — Telehealth (INDEPENDENT_AMBULATORY_CARE_PROVIDER_SITE_OTHER): Payer: Self-pay | Admitting: General Surgery

## 2011-01-10 NOTE — Telephone Encounter (Signed)
Message copied by Liliana Cline on Wed Jan 10, 2011  9:05 AM ------      Message from: Andrey Campanile, ERIC M      Created: Wed Jan 10, 2011  8:47 AM       pls call pt with path

## 2011-01-10 NOTE — Telephone Encounter (Signed)
Patient aware pathology was benign. He will follow up at appt on 10/10 and call with any problems prior.

## 2011-01-18 LAB — DIFFERENTIAL
Lymphocytes Relative: 22
Lymphs Abs: 1.5
Neutrophils Relative %: 63

## 2011-01-18 LAB — POCT I-STAT, CHEM 8
BUN: 16
Calcium, Ion: 1.07 — ABNORMAL LOW
Chloride: 106
Creatinine, Ser: 0.9
Glucose, Bld: 120 — ABNORMAL HIGH
HCT: 41
Hemoglobin: 13.9
Potassium: 3.7
Sodium: 140
TCO2: 25

## 2011-01-18 LAB — CK TOTAL AND CKMB (NOT AT ARMC)
CK, MB: 2.4
Relative Index: 0.6
Total CK: 379 — ABNORMAL HIGH

## 2011-01-18 LAB — TROPONIN I: Troponin I: <0.01

## 2011-01-18 LAB — CBC
HCT: 39.1
Hemoglobin: 13.6
WBC: 6.9

## 2011-01-24 LAB — POCT I-STAT 3, VENOUS BLOOD GAS (G3P V)
Bicarbonate: 26.3 — ABNORMAL HIGH
TCO2: 28
pCO2, Ven: 43.9 — ABNORMAL LOW
pH, Ven: 7.387 — ABNORMAL HIGH

## 2011-01-24 LAB — POCT I-STAT GLUCOSE
Glucose, Bld: 128 — ABNORMAL HIGH
Operator id: 221371

## 2011-01-30 ENCOUNTER — Telehealth (INDEPENDENT_AMBULATORY_CARE_PROVIDER_SITE_OTHER): Payer: Self-pay | Admitting: General Surgery

## 2011-01-30 LAB — POCT I-STAT CREATININE
Creatinine, Ser: 1.2
Operator id: 270111

## 2011-01-30 LAB — DIFFERENTIAL
Basophils Relative: 1
Lymphocytes Relative: 37
Lymphs Abs: 2.1
Monocytes Absolute: 0.6
Monocytes Relative: 12
Neutro Abs: 2.5
Neutrophils Relative %: 45

## 2011-01-30 LAB — I-STAT 8, (EC8 V) (CONVERTED LAB)
Acid-Base Excess: 2
HCT: 45
Operator id: 270111
Potassium: 3.8
TCO2: 28
pH, Ven: 7.416 — ABNORMAL HIGH

## 2011-01-30 LAB — CBC
Hemoglobin: 13.5
MCHC: 33.8
RBC: 4.49
WBC: 5.5

## 2011-01-30 LAB — POCT CARDIAC MARKERS: Troponin i, poc: <0.05

## 2011-01-30 NOTE — Telephone Encounter (Signed)
fyi

## 2011-01-31 ENCOUNTER — Encounter (INDEPENDENT_AMBULATORY_CARE_PROVIDER_SITE_OTHER): Payer: Medicare Other | Admitting: General Surgery

## 2011-02-05 LAB — URINALYSIS, ROUTINE W REFLEX MICROSCOPIC
Bilirubin Urine: NEGATIVE
Glucose, UA: 100 — AB
Ketones, ur: NEGATIVE
pH: 6

## 2011-02-05 LAB — DIFFERENTIAL
Eosinophils Relative: 5
Lymphocytes Relative: 31
Lymphs Abs: 1.8
Monocytes Absolute: 0.6
Monocytes Relative: 11

## 2011-02-05 LAB — COMPREHENSIVE METABOLIC PANEL
AST: 16
Albumin: 3.6
Calcium: 8.8
Creatinine, Ser: 0.78
GFR calc Af Amer: >60
Total Protein: 6.8

## 2011-02-05 LAB — RAPID URINE DRUG SCREEN, HOSP PERFORMED
Amphetamines: NOT DETECTED
Barbiturates: NOT DETECTED
Benzodiazepines: NOT DETECTED
Cocaine: NOT DETECTED

## 2011-02-05 LAB — CBC
MCHC: 33.8
MCV: 89.1
Platelets: 288
WBC: 5.7

## 2011-02-06 ENCOUNTER — Ambulatory Visit (INDEPENDENT_AMBULATORY_CARE_PROVIDER_SITE_OTHER): Payer: Medicare Other

## 2011-02-06 DIAGNOSIS — Z23 Encounter for immunization: Secondary | ICD-10-CM

## 2011-02-21 ENCOUNTER — Encounter: Payer: Self-pay | Admitting: *Deleted

## 2011-02-22 ENCOUNTER — Ambulatory Visit: Payer: Self-pay | Admitting: Cardiovascular Disease

## 2011-02-24 ENCOUNTER — Other Ambulatory Visit: Payer: Self-pay | Admitting: Family Medicine

## 2011-02-24 ENCOUNTER — Encounter: Payer: Self-pay | Admitting: Family Medicine

## 2011-02-24 DIAGNOSIS — G8929 Other chronic pain: Secondary | ICD-10-CM | POA: Insufficient documentation

## 2011-02-24 DIAGNOSIS — M542 Cervicalgia: Secondary | ICD-10-CM | POA: Insufficient documentation

## 2011-02-24 MED ORDER — OXYCODONE-ACETAMINOPHEN 10-325 MG PO TABS: 1.0000 | ORAL_TABLET | Freq: Four times a day (QID) | ORAL | Status: DC | PRN

## 2011-02-24 MED ORDER — BACLOFEN 10 MG PO TABS: 10.0000 mg | ORAL_TABLET | Freq: Every day | ORAL | Status: DC

## 2011-03-02 ENCOUNTER — Other Ambulatory Visit: Payer: Self-pay | Admitting: *Deleted

## 2011-03-02 ENCOUNTER — Telehealth: Payer: Self-pay | Admitting: Family Medicine

## 2011-03-02 MED ORDER — CHLORDIAZEPOXIDE HCL 5 MG PO CAPS: 5.0000 mg | ORAL_CAPSULE | Freq: Three times a day (TID) | ORAL | Status: DC | PRN

## 2011-03-02 MED ORDER — METOPROLOL SUCCINATE ER 50 MG PO TB24: 50.0000 mg | ORAL_TABLET | Freq: Every day | ORAL | Status: DC

## 2011-03-02 NOTE — Telephone Encounter (Signed)
Pt asked about med change.  I called [facility].  It would be reasonable to try plain chlordiazepoxide 5mg  po tid prn instead of librax.  I called in rx.  Notify pt.  Have him let us know if this works and if he needs a refill.  Thanks.

## 2011-03-02 NOTE — Telephone Encounter (Signed)
Advised pt medicine has been called to [facility].

## 2011-03-13 ENCOUNTER — Other Ambulatory Visit: Payer: Self-pay | Admitting: *Deleted

## 2011-03-13 MED ORDER — LISINOPRIL 10 MG PO TABS: 10.0000 mg | ORAL_TABLET | Freq: Every day | ORAL | Status: DC

## 2011-03-29 ENCOUNTER — Ambulatory Visit: Payer: Medicare Other | Admitting: Family Medicine

## 2011-04-02 ENCOUNTER — Ambulatory Visit (INDEPENDENT_AMBULATORY_CARE_PROVIDER_SITE_OTHER): Payer: Medicare Other | Admitting: Family Medicine

## 2011-04-02 DIAGNOSIS — E119 Type 2 diabetes mellitus without complications: Secondary | ICD-10-CM

## 2011-04-03 NOTE — Progress Notes (Signed)
  Subjective:    Patient ID: [name], male    DOB: Nov 25, 1950, 60 y.o.   MRN: [id number]  HPI    Review of Systems     Objective:   Physical Exam        Assessment & Plan:  OV cancelled.

## 2011-04-03 NOTE — Assessment & Plan Note (Signed)
OV cancelled °

## 2011-04-06 ENCOUNTER — Encounter: Payer: Self-pay | Admitting: Family Medicine

## 2011-04-06 ENCOUNTER — Telehealth: Payer: Self-pay | Admitting: Family Medicine

## 2011-04-06 ENCOUNTER — Ambulatory Visit (INDEPENDENT_AMBULATORY_CARE_PROVIDER_SITE_OTHER): Payer: Medicare Other | Admitting: Family Medicine

## 2011-04-06 ENCOUNTER — Telehealth: Payer: Self-pay | Admitting: Gastroenterology

## 2011-04-06 VITALS — BP 108/70 | HR 62 | Temp 98.2°F | Wt 245.0 lb

## 2011-04-06 DIAGNOSIS — E119 Type 2 diabetes mellitus without complications: Secondary | ICD-10-CM

## 2011-04-06 DIAGNOSIS — M25519 Pain in unspecified shoulder: Secondary | ICD-10-CM

## 2011-04-06 DIAGNOSIS — O24919 Unspecified diabetes mellitus in pregnancy, unspecified trimester: Secondary | ICD-10-CM

## 2011-04-06 DIAGNOSIS — Z136 Encounter for screening for cardiovascular disorders: Secondary | ICD-10-CM

## 2011-04-06 NOTE — Telephone Encounter (Signed)
Last OV 10/28/2009, with c/o nausea, lower abd pain and pain in his legs. He had an Abd U/S that was basically normal and an EGD with benign BX , Chronic Gastritis with + H. Pylori. Hx of GERD, DM, Anxiety, Depression, Esophageal Stricture, and many others. Today pt reports pt has been having problems for 3-4 weeks and he's been putting off calling us. He reports he feels like he's " boiling inside" and making noises. It c/o of pain above the waist and sometimes eating will help the pain. He cannot identify foods that trigger this. He also reports a white covering on his stool; when asked he stated like mucus. He reports he's always hungry and he has increased gas. Pt is on pain meds d/t he will have rotator cuff surgery soon. Will call back after speaking with Dr Jarold Motto. Dr Jarold Motto, in a call last year, pt was abusive to staff and you referred him back to his PCP. Can you advise on problem and will you see him again? Thanks.

## 2011-04-06 NOTE — Progress Notes (Signed)
Planned for shoulder surgery per Dr. Ginette Otto ortho, Dr. Charlann Boxer.  He has disabling pain and his quality of life is affected.  He's miserable from the pain.    Relevant history:  HTN, on beta blocker.  BP controlled.  H/o DM2, A1c 7.2 today.  No h/o MI, CHF.  No exertional CP (he does have MSK pain related to shoulder).  He get sob going up a flight of stairs but this is long standing, has been present at least a year by prev notes and is likely related to deconditioning.  Cath prev done w/o obstructive disease.  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    He's had some indigestion and upset stomach episodically but no fevers, no blood in stool.  He plans on following up with GI about this.    PMH and SH reviewed  ROS: See HPI, otherwise noncontributory.  Meds, vitals, and allergies reviewed.   GEN: nad, alert and oriented HEENT: mucous membranes moist NECK: supple w/o LA CV: rrr.   PULM: ctab, no inc wob ABD: soft, +bs EXT: no edema SKIN: no acute rash

## 2011-04-06 NOTE — Patient Instructions (Signed)
Talk to Dr. Jarold Motto about the stool changes and I'll check with Dr. Eden Emms about the the potential surgery.  Then we'll notify you and the ortho clinic.  Take care.

## 2011-04-06 NOTE — Telephone Encounter (Signed)
Opened in error

## 2011-04-08 ENCOUNTER — Encounter: Payer: Self-pay | Admitting: Family Medicine

## 2011-04-08 DIAGNOSIS — M25519 Pain in unspecified shoulder: Secondary | ICD-10-CM | POA: Insufficient documentation

## 2011-04-08 NOTE — Assessment & Plan Note (Signed)
Per ortho. I've called Dr. Eden Emms and he'll consider the case.  I told pt that I don't "clear" a patient, but that I try to assess if the patient is appropriately low risk for the procedure.  As long as Dr. Eden Emms doesn't have objection, the patient appears to be appropriately low risk for surgery. His CVA was cocaine related, he doesn't use cocaine now.  He is A1c is 7.2 and BP is controlled.  I will defer other preop labs to anesthesia and ortho.  EKG had sinus brady, but this is likely med related and not pathologic.  >25 min spent with face to face with patient, >50% counseling and/or coordinating care.

## 2011-04-08 NOTE — Assessment & Plan Note (Signed)
See notes on labs. 

## 2011-04-09 NOTE — Telephone Encounter (Signed)
He has functional bowel problems and a prior history of drug seeking. He has functional dyspepsia and does not need further GI workup. I would refer him back to his primary care physician.

## 2011-04-09 NOTE — Telephone Encounter (Signed)
Patient states that he went to see Dr Para March on Friday and Dr Para March advised him to call Dr Jarold Motto. Patient states that he is taking a medication that Dr Para March gave him and it is helping him a lot I have advised him if the meds are helping he should contact Dr Para March since he gave him  the meds

## 2011-04-10 ENCOUNTER — Telehealth: Payer: Self-pay | Admitting: Family Medicine

## 2011-04-10 ENCOUNTER — Telehealth: Payer: Self-pay | Admitting: *Deleted

## 2011-04-10 NOTE — Telephone Encounter (Signed)
I have d/w Dr. Eden Emms and he agrees patient is appropriate for surgery.  See letter. Please send to ortho after I sign it.

## 2011-04-10 NOTE — Telephone Encounter (Signed)
Patient advised.

## 2011-04-10 NOTE — Telephone Encounter (Signed)
NOTE FORWARDED TO DR Para March  MAY PROCEED WITH SURGERY PER DR Eden Emms  NORM CATH  NO DOCUMENTED HEART PROBLEMS ATYPICAL STUFF PER DR Eden Emms

## 2011-04-11 ENCOUNTER — Encounter (INDEPENDENT_AMBULATORY_CARE_PROVIDER_SITE_OTHER): Payer: Medicare Other | Admitting: General Surgery

## 2011-04-23 ENCOUNTER — Emergency Department (HOSPITAL_COMMUNITY)
Admission: EM | Admit: 2011-04-23 | Discharge: 2011-04-23 | Disposition: A | Discharge status: Home / Self Care | Payer: Medicare Other | Attending: Emergency Medicine | Admitting: Emergency Medicine

## 2011-04-23 ENCOUNTER — Encounter (HOSPITAL_COMMUNITY): Payer: Self-pay | Admitting: *Deleted

## 2011-04-23 DIAGNOSIS — W19XXXA Unspecified fall, initial encounter: Secondary | ICD-10-CM

## 2011-04-23 DIAGNOSIS — M79609 Pain in unspecified limb: Secondary | ICD-10-CM | POA: Insufficient documentation

## 2011-04-23 DIAGNOSIS — T148XXA Other injury of unspecified body region, initial encounter: Secondary | ICD-10-CM | POA: Insufficient documentation

## 2011-04-23 DIAGNOSIS — Z8673 Personal history of transient ischemic attack (TIA), and cerebral infarction without residual deficits: Secondary | ICD-10-CM | POA: Insufficient documentation

## 2011-04-23 DIAGNOSIS — I10 Essential (primary) hypertension: Secondary | ICD-10-CM | POA: Insufficient documentation

## 2011-04-23 DIAGNOSIS — E785 Hyperlipidemia, unspecified: Secondary | ICD-10-CM | POA: Insufficient documentation

## 2011-04-23 DIAGNOSIS — F341 Dysthymic disorder: Secondary | ICD-10-CM | POA: Insufficient documentation

## 2011-04-23 DIAGNOSIS — M25559 Pain in unspecified hip: Secondary | ICD-10-CM | POA: Insufficient documentation

## 2011-04-23 DIAGNOSIS — E119 Type 2 diabetes mellitus without complications: Secondary | ICD-10-CM | POA: Insufficient documentation

## 2011-04-23 DIAGNOSIS — W1789XA Other fall from one level to another, initial encounter: Secondary | ICD-10-CM | POA: Insufficient documentation

## 2011-04-23 DIAGNOSIS — K219 Gastro-esophageal reflux disease without esophagitis: Secondary | ICD-10-CM | POA: Insufficient documentation

## 2011-04-23 DIAGNOSIS — Z79899 Other long term (current) drug therapy: Secondary | ICD-10-CM | POA: Insufficient documentation

## 2011-04-23 LAB — GLUCOSE, CAPILLARY: Glucose-Capillary: 117 mg/dL — ABNORMAL HIGH (ref 70–99)

## 2011-04-23 MED ORDER — OXYCODONE-ACETAMINOPHEN 5-325 MG PO TABS
1.0000 | ORAL_TABLET | Freq: Once | ORAL | Status: AC
  Administered 2011-04-23: 1 via ORAL
  Filled 2011-04-23: qty 1

## 2011-04-23 MED ORDER — OXYCODONE-ACETAMINOPHEN 5-325 MG PO TABS: 1.0000 | ORAL_TABLET | Freq: Four times a day (QID) | ORAL | Status: AC | PRN

## 2011-04-23 NOTE — ED Notes (Signed)
The pt says he feel like his blood sugar is falling.  cbg checked and it was 117

## 2011-04-23 NOTE — ED Notes (Signed)
Pt reports falling on Friday, now having lower back pain and right elbow pain. Ambulatory at triage.

## 2011-04-23 NOTE — ED Notes (Signed)
The pt fell in water on Friday since then he has had increasing pain and soreness in his rt hip and his rt elbow and today he says he has been unable to use his rt hand without pain

## 2011-04-23 NOTE — ED Provider Notes (Signed)
History     CSN: [id number]  Arrival date & time 04/23/11  1252   First MD Initiated Contact with Patient 04/23/11 1605      Chief Complaint  Patient presents with  . Fall    (Consider location/radiation/quality/duration/timing/severity/associated sxs/prior treatment) Patient is a 60 y.o. male presenting with fall. The history is provided by the patient.  Fall The accident occurred more than 2 days ago. The fall occurred while walking (Had something burning in the oven and ran to get it and slipped and fell). He fell from a height of 1 to 2 ft. He landed on a hard floor. There was no blood loss. The point of impact was the left hip (Right hand). The pain is present in the left hip (Right hand). The pain is at a severity of 9/10. The pain is severe. He was ambulatory at the scene. Pertinent negatives include no numbness, no abdominal pain, no bowel incontinence, no vomiting, no headaches, no loss of consciousness and no tingling. The symptoms are aggravated by activity, use of the injured limb and pressure on the injury. He has tried acetaminophen for the symptoms. The treatment provided no relief.    Past Medical History  Diagnosis Date  . Chest pain   . GERD (gastroesophageal reflux disease)   . Hyperlipidemia   . Hypertension     Essential  . Anxiety   . Depression   . Stroke   . Diabetes mellitus     Type II  . Hand pain     Right long finger and mid palm  . Calcific tendonitis   . Dyspnea   . Foot pain, right   . Esophageal stricture   . Muscle spasm   . Orchitis and epididymitis, unspecified   . Back pain     with radiculopathy  . Circadian rhythm sleep disorder, irregular sleep-wake type   . Abdominal wall hernia   . Cocaine abuse     Past Surgical History  Procedure Date  . Tonsillectomy childhood  . Carpal tunnel release     Right  . Cardiac catheterization 5/04  . Trigger finger release     Right long finger  . Inguinal hernia repair     bilateral, 5  cm. RLQ Abd Lipoma Excision (Dr. Ezzard Standing)  . Esophagogastroduodenoscopy 07/17/07    Esoph. Stricture dilated (Dr. Marina Goodell)  . Anterior cervical discectomy 10/28/2008    with fusion (Dr. Shon Baton)  . Hematoma evacuation 10/29/2008    (Dr. Shon Baton)  . Direct laryngoscopy 11/02/2008    Extubation under anesthesia (Dr. Lazarus Salines)    Family History  Problem Relation Age of Onset  . Diabetes Mother   . Hypertension Mother   . Hyperlipidemia Mother   . Diabetes Father   . Diabetes Sister   . Hypertension Sister   . Hypertension Brother   . Diabetes Brother   . Heart disease Maternal Uncle     Heart failure  . Cancer Paternal Uncle     Prostate  . Cancer Paternal Grandfather     Prostate  . Stroke Neg Hx   . Diabetes Brother   . Hypertension Brother   . Diabetes Brother     History  Substance Use Topics  . Smoking status: Never Smoker   . Smokeless tobacco: Not on file  . Alcohol Use: Yes     Occasionally      Review of Systems  Gastrointestinal: Negative for vomiting, abdominal pain and bowel incontinence.  Neurological: Negative for tingling, loss  of consciousness, numbness and headaches.  All other systems reviewed and are negative.    Allergies  Review of patient's allergies indicates no known allergies.  Home Medications   Current Outpatient Rx  Name Route Sig Dispense Refill  . BACLOFEN 10 MG PO TABS Oral Take 1 tablet (10 mg total) by mouth at bedtime. Per Dr. Ethelene Hal 30 tablet 0  . CHLORDIAZEPOXIDE HCL 5 MG PO CAPS Oral Take 1 capsule (5 mg total) by mouth 3 (three) times daily as needed. 90 capsule 0  . CINNAMON PO Oral Take 1 tablet by mouth daily.     Marland Kitchen FLUTICASONE PROPIONATE 50 MCG/ACT NA SUSP Nasal Place 2 sprays into the nose daily. 16 g 2  . FUROSEMIDE 20 MG PO TABS Oral Take 0.5 tablets (10 mg total) by mouth daily as needed (edema, take as infrequently as possible). 30 tablet 1  . GLIPIZIDE-METFORMIN HCL 5-500 MG PO TABS Oral Take 1 tablet by mouth daily.  180 tablet 3  . LISINOPRIL 10 MG PO TABS Oral Take 1 tablet (10 mg total) by mouth daily. 30 tablet 3  . METOPROLOL SUCCINATE ER 50 MG PO TB24 Oral Take 1 tablet (50 mg total) by mouth daily. 90 tablet 2  . OXYCODONE-ACETAMINOPHEN 10-325 MG PO TABS Oral Take 1 tablet by mouth every 6 (six) hours as needed for pain (per Dr. Ethelene Hal). 120 tablet 0  . TRAMADOL HCL 50 MG PO TABS Oral Take 50 mg by mouth every 6 (six) hours as needed.        BP 137/91  Pulse 84  Temp(Src) 98.4 F (36.9 C) (Oral)  Resp 18  SpO2 100%  Physical Exam  Nursing note and vitals reviewed. Constitutional: He is oriented to person, place, and time. He appears well-developed and well-nourished. No distress.  HENT:  Head: Normocephalic and atraumatic.  Mouth/Throat: Oropharynx is clear and moist.  Eyes: Conjunctivae and EOM are normal. Pupils are equal, round, and reactive to light.  Neck: Normal range of motion. Neck supple.  Abdominal: Soft. He exhibits no distension. There is no tenderness. There is no rebound and no guarding.  Musculoskeletal: Normal range of motion. He exhibits tenderness. He exhibits no edema.       Tenderness to palpation over the left hip and the right hand. No tenderness in the right wrist and normal pulses in the upper and lower extremities bilaterally. No swelling, deformity  Neurological: He is alert and oriented to person, place, and time.  Skin: Skin is warm and dry. No rash noted. No erythema.  Psychiatric: He has a normal mood and affect. His behavior is normal.    ED Course  Procedures (including critical care time)  Labs Reviewed  GLUCOSE, CAPILLARY - Abnormal; Notable for the following:    Glucose-Capillary 117 (*)    All other components within normal limits  POCT CBG MONITORING   No results found.   No diagnosis found.    MDM   Patient with a mechanical fall several days ago with no pain initially after her fall but then developed pain in the right hand and left hip.  He is able to ambulate but states that muscles just continue to get sorer. No signs concerning for acute fracture or dislocation. Neurovascularly intact and normal vital signs today. Will treat pain.        Gwyneth Sprout, MD 04/23/11 1630

## 2011-04-24 DIAGNOSIS — K5790 Diverticulosis of intestine, part unspecified, without perforation or abscess without bleeding: Secondary | ICD-10-CM

## 2011-04-24 HISTORY — DX: Diverticulosis of intestine, part unspecified, without perforation or abscess without bleeding: K57.90

## 2011-04-24 HISTORY — PX: SHOULDER SURGERY: SHX246

## 2011-04-25 ENCOUNTER — Other Ambulatory Visit: Payer: Self-pay | Admitting: Family Medicine

## 2011-04-25 DIAGNOSIS — E119 Type 2 diabetes mellitus without complications: Secondary | ICD-10-CM

## 2011-04-30 ENCOUNTER — Other Ambulatory Visit: Payer: Self-pay

## 2011-05-01 ENCOUNTER — Other Ambulatory Visit: Payer: Self-pay | Admitting: *Deleted

## 2011-05-01 MED ORDER — FUROSEMIDE 20 MG PO TABS: 10.0000 mg | ORAL_TABLET | Freq: Every day | ORAL | Status: DC | PRN

## 2011-05-01 NOTE — Telephone Encounter (Signed)
Received faxed refill request from pharmacy. Refill sent to pharmacy electronically. 

## 2011-05-03 ENCOUNTER — Ambulatory Visit: Payer: Self-pay | Admitting: Family Medicine

## 2011-05-29 ENCOUNTER — Ambulatory Visit: Payer: Medicare Other | Admitting: Rehabilitative and Restorative Service Providers"

## 2011-06-11 ENCOUNTER — Ambulatory Visit: Payer: Medicare Other | Admitting: Rehabilitative and Restorative Service Providers"

## 2011-06-13 ENCOUNTER — Ambulatory Visit (INDEPENDENT_AMBULATORY_CARE_PROVIDER_SITE_OTHER): Payer: Medicare Other | Admitting: Cardiovascular Disease

## 2011-06-13 ENCOUNTER — Encounter: Payer: Self-pay | Admitting: Cardiovascular Disease

## 2011-06-13 ENCOUNTER — Telehealth: Payer: Self-pay | Admitting: Family Medicine

## 2011-06-13 DIAGNOSIS — E119 Type 2 diabetes mellitus without complications: Secondary | ICD-10-CM

## 2011-06-13 DIAGNOSIS — I10 Essential (primary) hypertension: Secondary | ICD-10-CM

## 2011-06-13 DIAGNOSIS — E7800 Pure hypercholesterolemia, unspecified: Secondary | ICD-10-CM

## 2011-06-13 NOTE — Progress Notes (Signed)
Patient ID: [name], male   DOB: 16-Sep-1950, 61 y.o.   MRN: [id number] Ciaran is seen today for F/U of SSCP. He has had a normal cath in 2008. 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 He has had two neck surgeries recently the last in June by Dr Shon Baton. They were apparantly complicated and he still sees Dr Ethelene Hal for "pain". Last night he had a lot of sharp pain in the left side of his chest radiating to the left arm with paresthesias. It sounds radicular and clearly not cardiac. He has some exertional dyspnea especially with the pain. He has had a normal EF in the past with no smoking or chronic lung disease. He has been quite sedentary since his bilateral neck surgery and is deconditioned  His A1c has been in the 7's. We discussed his diet. He needs to eat less carbs and also less fruit. Had shoulder surgery with Dr Ranell Patrick   Discussed exercise program as he is too sedentary.    ROS: Denies fever, malais, weight loss, blurry vision, decreased visual acuity, cough, sputum, SOB, hemoptysis, pleuritic pain, palpitaitons, heartburn, abdominal pain, melena, lower extremity edema, claudication, or rash.  All other systems reviewed and negative  General: Affect appropriate Healthy:  appears stated age HEENT: normal Neck supple with no adenopathy JVP normal no bruits no thyromegaly Lungs clear with no wheezing and good diaphragmatic motion Heart:  S1/S2 no murmur, no rub, gallop or click PMI normal Abdomen: benighn, BS positve, no tenderness, no AAA no bruit.  No HSM or HJR Distal pulses intact with no bruits No edema Neuro non-focal Skin warm and dry No muscular weakness   Current Outpatient Prescriptions  Medication Sig Dispense Refill  . baclofen (LIORESAL) 10 MG tablet Take 1 tablet (10 mg total) by mouth at bedtime. Per Dr. Ethelene Hal  30 tablet  0  . chlordiazePOXIDE (LIBRIUM) 5 MG capsule Take 1 capsule (5 mg total) by  mouth 3 (three) times daily as needed.  90 capsule  0  . CINNAMON PO Take 1 tablet by mouth daily.       . fluticasone (FLONASE) 50 MCG/ACT nasal spray Place 2 sprays into the nose daily.  16 g  2  . furosemide (LASIX) 20 MG tablet Take 0.5 tablets (10 mg total) by mouth daily as needed (edema, take as infrequently as possible).  30 tablet  5  . glipiZIDE-metformin (METAGLIP) 5-500 MG per tablet Take 1 tablet by mouth daily.  180 tablet  3  . lisinopril (PRINIVIL,ZESTRIL) 10 MG tablet Take 1 tablet (10 mg total) by mouth daily.  30 tablet  3  . metoprolol (TOPROL-XL) 50 MG 24 hr tablet Take 1 tablet (50 mg total) by mouth daily.  90 tablet  2  . oxyCODONE-acetaminophen (PERCOCET) 10-325 MG per tablet Take 1 tablet by mouth every 6 (six) hours as needed for pain (per Dr. Ethelene Hal).  120 tablet  0  . traMADol (ULTRAM) 50 MG tablet Take 50 mg by mouth every 6 (six) hours as needed.          Allergies  Review of patient's allergies indicates no known allergies.  Electrocardiogram:  Assessment and Plan

## 2011-06-13 NOTE — Assessment & Plan Note (Signed)
Cholesterol is at goal.  Continue current dose of statin and diet Rx.  No myalgias or side effects.  F/U  LFT's in 6 months. Lab Results  Component Value Date   LDLCALC 113* 10/24/2010

## 2011-06-13 NOTE — Telephone Encounter (Signed)
Per cards note, pt has asked to get another PMD in the clinic.  See if another doc at [facility] will assume his care.  He should not be scheduled with me anymore.  Thanks.

## 2011-06-13 NOTE — Assessment & Plan Note (Signed)
Discussed low carb diet.  Target hemoglobin A1c is 6.5 or less.  Continue current medications.  

## 2011-06-13 NOTE — Assessment & Plan Note (Signed)
Well controlled.  Continue current medications and low sodium Dash type diet.    

## 2011-06-13 NOTE — Patient Instructions (Signed)
Your physician wants you to follow-up in: YEAR WITH DR Haywood Filler will receive a reminder letter in the mail two months in advance. If you don't receive a letter, please call our office to schedule the follow-up appointment. Your physician recommends that you continue on your current medications as directed. Please refer to the Current Medication list given to you today. [facility] OFFICE TO CALL AND  MAKE ARRANGEMENTS FOR PT TO SEE DR LETVAK.

## 2011-06-14 ENCOUNTER — Telehealth: Payer: Self-pay | Admitting: Family Medicine

## 2011-06-14 NOTE — Telephone Encounter (Signed)
Looks like Dr. Eden Emms wanted Dr. Elbert Ewings ...but ok by me if ok by Dr. Algis Downs.

## 2011-06-14 NOTE — Telephone Encounter (Signed)
Dr. Eden Emms and Pt would like to transfer pts PCP  medical care from Dr. Para March to Dr. Sharen Hones. Please respond...cdavis

## 2011-06-15 NOTE — Telephone Encounter (Signed)
Fine

## 2011-06-17 NOTE — Telephone Encounter (Signed)
Please take me off at PCP.  Thanks.

## 2011-06-18 ENCOUNTER — Other Ambulatory Visit: Payer: Self-pay | Admitting: *Deleted

## 2011-06-18 NOTE — Telephone Encounter (Signed)
Appt has been scheduled, pt okay w/ scheduling appt w/ Dr. Sharen Hones.[name]

## 2011-06-19 ENCOUNTER — Ambulatory Visit (INDEPENDENT_AMBULATORY_CARE_PROVIDER_SITE_OTHER): Payer: Medicare Other | Admitting: Family Medicine

## 2011-06-19 ENCOUNTER — Encounter: Payer: Self-pay | Admitting: Family Medicine

## 2011-06-19 VITALS — BP 130/82 | HR 80 | Temp 98.3°F | Wt 241.0 lb

## 2011-06-19 DIAGNOSIS — I10 Essential (primary) hypertension: Secondary | ICD-10-CM

## 2011-06-19 DIAGNOSIS — E119 Type 2 diabetes mellitus without complications: Secondary | ICD-10-CM

## 2011-06-19 DIAGNOSIS — G4700 Insomnia, unspecified: Secondary | ICD-10-CM

## 2011-06-19 DIAGNOSIS — J310 Chronic rhinitis: Secondary | ICD-10-CM

## 2011-06-19 LAB — GLUCOSE, POCT (MANUAL RESULT ENTRY): POC Glucose: 134

## 2011-06-19 MED ORDER — CHLORDIAZEPOXIDE HCL 5 MG PO CAPS: 5.0000 mg | ORAL_CAPSULE | Freq: Three times a day (TID) | ORAL | Status: DC | PRN

## 2011-06-19 MED ORDER — FLUTICASONE PROPIONATE 50 MCG/ACT NA SUSP: 2.0000 | Freq: Every day | NASAL | Status: DC

## 2011-06-19 NOTE — Patient Instructions (Addendum)
We will work on sleep hygiene, let me know how you're doing. I've sent in flonase to your pharmacy.  You should have chlordiazepoxide at pharmacy. Hold flonase for next week ,start nasal saline irrigation throughout the day.  This should help bleeding. Return to see me in 3 months for follow up. Your physical will be due 10/2011.  Insomnia Insomnia is frequent trouble falling and/or staying asleep. Insomnia can be a long term problem or a short term problem. Both are common. Insomnia can be a short term problem when the wakefulness is related to a certain stress or worry. Long term insomnia is often related to ongoing stress during waking hours and/or poor sleeping habits. Overtime, sleep deprivation itself can make the problem worse. Every little thing feels more severe because you are overtired and your ability to cope is decreased. CAUSES   Stress, anxiety, and depression.   Poor sleeping habits.   Distractions such as TV in the bedroom.   Naps close to bedtime.   Engaging in emotionally charged conversations before bed.   Technical reading before sleep.   Alcohol and other sedatives. They may make the problem worse. They can hurt normal sleep patterns and normal dream activity.   Stimulants such as caffeine for several hours prior to bedtime.   Pain syndromes and shortness of breath can cause insomnia.   Exercise late at night.   Changing time zones may cause sleeping problems (jet lag).  It is sometimes helpful to have someone observe your sleeping patterns. They should look for periods of not breathing during the night (sleep apnea). They should also look to see how long those periods last. If you live alone or observers are uncertain, you can also be observed at a sleep clinic where your sleep patterns will be professionally monitored. Sleep apnea requires a checkup and treatment. Give your caregivers your medical history. Give your caregivers observations your family has made  about your sleep.  SYMPTOMS   Not feeling rested in the morning.   Anxiety and restlessness at bedtime.   Difficulty falling and staying asleep.  TREATMENT   Your caregiver may prescribe treatment for an underlying medical disorders. Your caregiver can give advice or help if you are using alcohol or other drugs for self-medication. Treatment of underlying problems will usually eliminate insomnia problems.   Medications can be prescribed for short time use. They are generally not recommended for lengthy use.   Over-the-counter sleep medicines are not recommended for lengthy use. They can be habit forming.   You can promote easier sleeping by making lifestyle changes such as:   Using relaxation techniques that help with breathing and reduce muscle tension.   Exercising earlier in the day.   Changing your diet and the time of your last meal. No night time snacks.   Establish a regular time to go to bed.   Counseling can help with stressful problems and worry.   Soothing music and white noise may be helpful if there are background noises you cannot remove.   Stop tedious detailed work at least one hour before bedtime.  HOME CARE INSTRUCTIONS   Keep a diary. Inform your caregiver about your progress. This includes any medication side effects. See your caregiver regularly. Take note of:   Times when you are asleep.   Times when you are awake during the night.   The quality of your sleep.   How you feel the next day.  This information will help your caregiver care for  you.  Get out of bed if you are still awake after 15 minutes. Read or do some quiet activity. Keep the lights down. Wait until you feel sleepy and go back to bed.   Keep regular sleeping and waking hours. Avoid naps.   Exercise regularly.   Avoid distractions at bedtime. Distractions include watching television or engaging in any intense or detailed activity like attempting to balance the household checkbook.     Develop a bedtime ritual. Keep a familiar routine of bathing, brushing your teeth, climbing into bed at the same time each night, listening to soothing music. Routines increase the success of falling to sleep faster.   Use relaxation techniques. This can be using breathing and muscle tension release routines. It can also include visualizing peaceful scenes. You can also help control troubling or intruding thoughts by keeping your mind occupied with boring or repetitive thoughts like the old concept of counting sheep. You can make it more creative like imagining planting one beautiful flower after another in your backyard garden.   During your day, work to eliminate stress. When this is not possible use some of the previous suggestions to help reduce the anxiety that accompanies stressful situations.  MAKE SURE YOU:   Understand these instructions.   Will watch your condition.   Will get help right away if you are not doing well or get worse.  Document Released: 04/06/2000 Document Revised: 12/20/2010 Document Reviewed: 05/07/2007 [facility] Patient Information 2012 [place], [place].f

## 2011-06-19 NOTE — Telephone Encounter (Signed)
Please call in, take me off as the PCP.  Then route to Dr. Reece Agar as a FYI.  Thanks.

## 2011-06-19 NOTE — Assessment & Plan Note (Signed)
Hold INS given endorsing some epistaxis. Start nasal saline regularly, then restart INS in 1-2 wks.

## 2011-06-19 NOTE — Assessment & Plan Note (Signed)
DM check when next returns in 3 mo.  Prior fasting for blood work.

## 2011-06-19 NOTE — Telephone Encounter (Signed)
Medication phoned to pharmacy.  PCP changed to Dr. Reece Agar.

## 2011-06-19 NOTE — Progress Notes (Signed)
Subjective:    Patient ID: [name], male    DOB: 13-Aug-1950, 61 y.o.   MRN: [id number]  HPI CC: abd issues, transfer from Dr. Algis Downs  Had disagreement with Dr. Algis Downs so asked to transfer care.  Insomnia - known problem.  Having trouble sleeping at night.  Takes 45 min to fall asleep at times.  Averaging 2 hours of sleep per night.  Main concern is sleep maintenance insomnia.  Bedtime routine - bedtime is 11pm.  Has TV in bedroom.  No radio.  Dinner is at 5-6 pm.  Calm, quiet dark environment.  Denies EtOH.  No naps during day.  Nocturia x2 nightly.  Also endorses nonspecific abd issues that present with burning pain,dizziness.  Had R RTC repair 05/21/2011, still with some pain.  Some burning when cologne gets into scar.  DM - cbg today 134. States has not had glucometer for last month.  Going to pharmacy to pick up new one today.  Due for DM check - will return for this.  Allergic rhinitis - taking flonase daily, but out recently.  noticing some epistaxis recently.  Does not use nasal saline.  ?h/o CVA.  Preventative: Last CPE - 10/2010.  Medications and allergies reviewed and updated in chart.  Past histories reviewed and updated if relevant as below. Patient Active Problem List  Diagnoses  . LIPOMA  . DIABETES MELLITUS, TYPE II  . HYPERCHOLESTEROLEMIA, PURE  . ANEMIA, IRON DEFICIENCY  . ANXIETY DEPRESSION  . ESSENTIAL HYPERTENSION  . TIA  . ESOPHAGEAL STRICTURE  . GERD  . ABDOMINAL WALL HERNIA  . CALCIFIC TENDINITIS  . BACK PAIN WITH RADICULOPATHY  . COCAINE ABUSE, HX OF  . Rhinitis  . Routine general medical examination at a health care facility  . Edema  . Neck pain, chronic  . Shoulder pain   Past Medical History  Diagnosis Date  . Chest pain     normal cath 2008.  CP deemed atypical and non cardiac by cards, ?discogenic  . GERD (gastroesophageal reflux disease)   . Hyperlipidemia   . Hypertension     Essential  . Anxiety   . Depression   . Stroke   .  T2DM (type 2 diabetes mellitus)   . Hand pain     Right long finger and mid palm  . Calcific tendonitis   . Dyspnea   . Foot pain, right   . Esophageal stricture   . Muscle spasm   . Orchitis and epididymitis, unspecified   . Back pain     with radiculopathy  . Circadian rhythm sleep disorder, irregular sleep-wake type   . Abdominal wall hernia   . Cocaine abuse     history   Past Surgical History  Procedure Date  . Tonsillectomy childhood  . Carpal tunnel release     Right  . Cardiac catheterization 5/04  . Trigger finger release     Right long finger  . Inguinal hernia repair     bilateral, 5 cm. RLQ Abd Lipoma Excision (Dr. Ezzard Standing)  . Esophagogastroduodenoscopy 07/17/07    Esoph. Stricture dilated (Dr. Marina Goodell)  . Anterior cervical discectomy 10/28/2008    with fusion (Dr. Shon Baton)  . Hematoma evacuation 10/29/2008    (Dr. Shon Baton)  . Direct laryngoscopy 11/02/2008    Extubation under anesthesia (Dr. Lazarus Salines)   History  Substance Use Topics  . Smoking status: Never Smoker   . Smokeless tobacco: Not on file  . Alcohol Use: Yes  Occasionally   Family History  Problem Relation Age of Onset  . Diabetes Mother   . Hypertension Mother   . Hyperlipidemia Mother   . Diabetes Father   . Diabetes Sister   . Hypertension Sister   . Hypertension Brother   . Diabetes Brother   . Heart disease Maternal Uncle     Heart failure  . Cancer Paternal Uncle     Prostate  . Cancer Paternal Grandfather     Prostate  . Stroke Neg Hx   . Diabetes Brother   . Hypertension Brother   . Diabetes Brother    No Known Allergies Current Outpatient Prescriptions on File Prior to Visit  Medication Sig Dispense Refill  . baclofen (LIORESAL) 10 MG tablet Take 1 tablet (10 mg total) by mouth at bedtime. Per Dr. Ethelene Hal  30 tablet  0  . CINNAMON PO Take 1 tablet by mouth daily.       . furosemide (LASIX) 20 MG tablet Take 0.5 tablets (10 mg total) by mouth daily as needed (edema, take as  infrequently as possible).  30 tablet  5  . glipiZIDE-metformin (METAGLIP) 5-500 MG per tablet Take 1 tablet by mouth daily.  180 tablet  3  . lisinopril (PRINIVIL,ZESTRIL) 10 MG tablet Take 1 tablet (10 mg total) by mouth daily.  30 tablet  3  . metoprolol (TOPROL-XL) 50 MG 24 hr tablet Take 1 tablet (50 mg total) by mouth daily.  90 tablet  2  . chlordiazePOXIDE (LIBRIUM) 5 MG capsule Take 1 capsule (5 mg total) by mouth 3 (three) times daily as needed.  90 capsule  0     Review of Systems Per HPI    Objective:   Physical Exam  Nursing note and vitals reviewed. Constitutional: He appears well-developed and well-nourished. No distress.  HENT:  Head: Normocephalic and atraumatic.  Nose: Nose normal. No mucosal edema or rhinorrhea.  Mouth/Throat: Oropharynx is clear and moist. No oropharyngeal exudate.       No obvious bleeding in nares.  + pale  Eyes: Conjunctivae and EOM are normal. Pupils are equal, round, and reactive to light. No scleral icterus.  Neck: Normal range of motion. Neck supple.  Cardiovascular: Normal rate, regular rhythm, normal heart sounds and intact distal pulses.   No murmur heard. Pulmonary/Chest: Effort normal and breath sounds normal. No respiratory distress. He has no wheezes. He has no rales.  Musculoskeletal: He exhibits no edema.  Skin: Skin is warm and dry. No rash noted.  Psychiatric: He has a normal mood and affect.       Assessment & Plan:

## 2011-06-19 NOTE — Assessment & Plan Note (Signed)
Stable on current meds, continue.  

## 2011-06-19 NOTE — Assessment & Plan Note (Signed)
Sounds like sleep maintenance and sleep initiation insomnia. Already on benzo (chlordiazepoxide) for GI per pt as well as baclofen at night for muscle spasm per Dr. Ethelene Hal. Hesitant to place on sleep aide long term. Discussed sleep hygiene strategies in detail, pt will try this and reassess next visit. Consider trazodone or similar med.

## 2011-06-20 ENCOUNTER — Telehealth: Payer: Self-pay | Admitting: Family Medicine

## 2011-06-20 NOTE — Telephone Encounter (Signed)
Have him check with insurance to see what test strips are covered and ok to switch to that (may need different glucometer)

## 2011-06-20 NOTE — Telephone Encounter (Signed)
Patient advised as instructed via telephone, he will call the insurance company now and call back to let us know.

## 2011-06-20 NOTE — Telephone Encounter (Signed)
Pt called again, says he is at the pharmacy now. If request for strips could be called in asap...Marland KitchenMarland KitchenMarland Kitchen

## 2011-06-20 NOTE — Telephone Encounter (Signed)
Pt came by and said [place] [phone] ??? and he will use [facility] on [address] For the test strips.

## 2011-06-20 NOTE — Telephone Encounter (Signed)
Pt is stating that the test strips for his meter are not covered under his insurance and he was wanting to know what he should do.

## 2011-06-21 MED ORDER — GLUCOSE BLOOD VI STRP: ORAL_STRIP | Status: DC

## 2011-06-21 NOTE — Telephone Encounter (Signed)
plz notify sent in.  Lancets should be over the counter

## 2011-06-21 NOTE — Telephone Encounter (Signed)
Pt stopped by the office before heading to Pharmacy I let him know that he RX was sent in.

## 2011-06-21 NOTE — Telephone Encounter (Signed)
Patient called and said he spoke to insurance. They told him they cover all test strips. 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

## 2011-06-21 NOTE — Telephone Encounter (Signed)
Pt called again this morning about his test strips. He is worried about his levels.

## 2011-06-21 NOTE — Telephone Encounter (Signed)
Spoke with patient. Advised him to call his insurance and find out which meter and supplies they will cover. I told him we needed to know that in order to prescribe the correct items because we didn't know what is covered under his plan. He verbalized understanding and said he will call me back with the type.

## 2011-07-12 ENCOUNTER — Encounter: Payer: Self-pay | Admitting: Family Medicine

## 2011-07-12 ENCOUNTER — Ambulatory Visit (INDEPENDENT_AMBULATORY_CARE_PROVIDER_SITE_OTHER): Payer: Medicare Other | Admitting: Family Medicine

## 2011-07-12 VITALS — BP 112/78 | HR 84 | Temp 98.0°F | Wt 237.5 lb

## 2011-07-12 DIAGNOSIS — R42 Dizziness and giddiness: Secondary | ICD-10-CM

## 2011-07-12 DIAGNOSIS — Z79899 Other long term (current) drug therapy: Secondary | ICD-10-CM

## 2011-07-12 MED ORDER — MONTELUKAST SODIUM 10 MG PO TABS: 10.0000 mg | ORAL_TABLET | Freq: Every day | ORAL | Status: DC

## 2011-07-12 NOTE — Progress Notes (Addendum)
  Subjective:    Patient ID: [name], male    DOB: [birth year], 61 y.o.   MRN: [id number]  HPI CC: not feeling well  Endorsing several week h/o dizziness, stomach upset, nausea worse in am.  Ate some beef with gravy which led to diarrhea described as some formed, some watery stool.  Dizziness described as lightheaded and imbalance with standing associated with nausea.  Occasionally vertigo "room spinning" sensation.  Has to wait a few sec prior to getting going.  Some SOB with this.  Continued head congestion, clear drainage.  + coryza, continuous PNdrainage, some cough.  Using flonase but not nasal saline.  Stopped nasal saline 2/2 epistaxis.  Has not tried singulair in past - would like to try.  Has had evaluation for this dizziness/nausea in past, per pt unrevealing.  Endorses sweating as well.  No recent viral infections.  No fevers/chills, vomiting, blood in stool or urine.  Denies abd pain, HA, CP/tightness, irregular or skipped heart beats, palpitations.  No travel outside of country.  Has never had PPD placed.  Stress - father dying from prostate cancer in [place].  Stress from this.  Neck surgery 2 yrs ago.  Shoulder surgery last year.  Residual pain from this.  DM - keeping track of sugars - checks bid, states doing well.  Forgot log today.  Some high fasting sugars to 200s but otherwise running well.  Denies lows.  Wt Readings from Last 3 Encounters:  07/12/11 237 lb 8 oz (107.729 kg)  06/19/11 241 lb (109.317 kg)  06/13/11 237 lb 12.8 oz (107.865 kg)   Review of Systems Per HPI    Objective:   Physical Exam  Nursing note and vitals reviewed. Constitutional: He is oriented to person, place, and time. He appears well-developed and well-nourished. No distress.  HENT:  Head: Normocephalic and atraumatic.  Right Ear: Hearing, tympanic membrane, external ear and ear canal normal.  Left Ear: Hearing, tympanic membrane, external ear and ear canal normal.  Nose: Nose  normal. No mucosal edema or rhinorrhea. Right sinus exhibits no maxillary sinus tenderness and no frontal sinus tenderness. Left sinus exhibits no maxillary sinus tenderness and no frontal sinus tenderness.  Mouth/Throat: Uvula is midline, oropharynx is clear and moist and mucous membranes are normal. No oropharyngeal exudate, posterior oropharyngeal edema, posterior oropharyngeal erythema or tonsillar abscesses.  Eyes: Conjunctivae and EOM are normal. Pupils are equal, round, and reactive to light. No scleral icterus.  Neck: Normal range of motion. Neck supple. Carotid bruit is not present. No thyromegaly present.  Cardiovascular: Normal rate, regular rhythm, normal heart sounds and intact distal pulses.   No murmur heard. Pulmonary/Chest: Effort normal and breath sounds normal. No respiratory distress. He has no wheezes. He has no rales.  Abdominal: Soft. Bowel sounds are normal. He exhibits no distension and no mass. There is no tenderness. There is no rebound and no guarding.  Musculoskeletal: He exhibits no edema.  Lymphadenopathy:    He has no cervical adenopathy.  Neurological: He is alert and oriented to person, place, and time. He has normal strength. No cranial nerve deficit or sensory deficit. He displays a negative Romberg sign. Coordination and gait normal.       Nl FTN, HTS. dix hallpike negative  Skin: Skin is warm and dry. No rash noted.  Psychiatric: He has a normal mood and affect.       Assessment & Plan:

## 2011-07-12 NOTE — Assessment & Plan Note (Addendum)
Along with sinus congestion, nausea. Do not think related to glycemic control. Could be sinus related however neither exam nor story consistent with infection.  ?allergic - rec start nasal saline throughout the day and trial of singulair for congestion.  Stopped INS 2/2 epistaxis. Orthostatics negative in office today.  h/o CVA in chart however neurological exam nonfocal today. No blood work since mid 2012, check CBC, TSH for reversible causes of imbalance. Reassess next visit. A total of 30 minutes were spent face-to-face with the patient during this encounter and over half of that time was spent on counseling and coordination of care

## 2011-07-12 NOTE — Patient Instructions (Addendum)
Trial of singulair for allergies - take daily for 1 month.  If improvement noted, may continue , otherwise probably not worth continuing. This dizziness, nausea could be coming from sinus congestion. We checked blood work today as well as vital signs in sitting and standing position which were normal. Return in 2 months for diabetes check , keep log of sugars prior.

## 2011-07-13 LAB — COMPREHENSIVE METABOLIC PANEL
AST: 16 U/L (ref 0–37)
Albumin: 4.6 g/dL (ref 3.5–5.2)
Alkaline Phosphatase: 57 U/L (ref 39–117)
BUN: 20 mg/dL (ref 6–23)
Potassium: 4.8 mEq/L (ref 3.5–5.1)

## 2011-07-13 LAB — CBC WITH DIFFERENTIAL/PLATELET
Basophils Relative: 0.2 % (ref 0.0–3.0)
Eosinophils Relative: 1.9 % (ref 0.0–5.0)
HCT: 43.1 % (ref 39.0–52.0)
Lymphs Abs: 2 10*3/uL (ref 0.7–4.0)
MCV: 91.2 fl (ref 78.0–100.0)
Monocytes Absolute: 0.5 10*3/uL (ref 0.1–1.0)
Neutro Abs: 4.5 10*3/uL (ref 1.4–7.7)
Platelets: 287 10*3/uL (ref 150.0–400.0)
RBC: 4.72 Mil/uL (ref 4.22–5.81)
WBC: 7.2 10*3/uL (ref 4.5–10.5)

## 2011-07-17 ENCOUNTER — Other Ambulatory Visit: Payer: Self-pay | Admitting: *Deleted

## 2011-07-17 MED ORDER — LISINOPRIL 10 MG PO TABS: 10.0000 mg | ORAL_TABLET | Freq: Every day | ORAL | Status: DC

## 2011-07-21 ENCOUNTER — Encounter (HOSPITAL_COMMUNITY): Payer: Self-pay | Admitting: Emergency Medicine

## 2011-07-21 ENCOUNTER — Emergency Department (HOSPITAL_COMMUNITY)
Admission: EM | Admit: 2011-07-21 | Discharge: 2011-07-21 | Disposition: A | Discharge status: Home / Self Care | Payer: Medicare Other | Attending: Emergency Medicine | Admitting: Emergency Medicine

## 2011-07-21 DIAGNOSIS — Z79899 Other long term (current) drug therapy: Secondary | ICD-10-CM | POA: Insufficient documentation

## 2011-07-21 DIAGNOSIS — E119 Type 2 diabetes mellitus without complications: Secondary | ICD-10-CM | POA: Insufficient documentation

## 2011-07-21 DIAGNOSIS — I10 Essential (primary) hypertension: Secondary | ICD-10-CM | POA: Insufficient documentation

## 2011-07-21 DIAGNOSIS — M25519 Pain in unspecified shoulder: Secondary | ICD-10-CM

## 2011-07-21 DIAGNOSIS — Z7982 Long term (current) use of aspirin: Secondary | ICD-10-CM | POA: Insufficient documentation

## 2011-07-21 DIAGNOSIS — E785 Hyperlipidemia, unspecified: Secondary | ICD-10-CM | POA: Insufficient documentation

## 2011-07-21 DIAGNOSIS — K219 Gastro-esophageal reflux disease without esophagitis: Secondary | ICD-10-CM | POA: Insufficient documentation

## 2011-07-21 DIAGNOSIS — Z8673 Personal history of transient ischemic attack (TIA), and cerebral infarction without residual deficits: Secondary | ICD-10-CM | POA: Insufficient documentation

## 2011-07-21 MED ORDER — OXYCODONE-ACETAMINOPHEN 10-325 MG PO TABS: 1.0000 | ORAL_TABLET | ORAL | Status: AC | PRN

## 2011-07-21 MED ORDER — OXYCODONE-ACETAMINOPHEN 5-325 MG PO TABS
2.0000 | ORAL_TABLET | Freq: Once | ORAL | Status: AC
  Administered 2011-07-21: 2 via ORAL
  Filled 2011-07-21: qty 1

## 2011-07-21 MED ORDER — OXYCODONE-ACETAMINOPHEN 5-325 MG PO TABS
1.0000 | ORAL_TABLET | Freq: Once | ORAL | Status: DC
  Filled 2011-07-21: qty 1

## 2011-07-21 MED ORDER — IBUPROFEN 200 MG PO TABS
600.0000 mg | ORAL_TABLET | Freq: Once | ORAL | Status: AC
  Administered 2011-07-21: 600 mg via ORAL
  Filled 2011-07-21: qty 3

## 2011-07-21 NOTE — Discharge Instructions (Signed)
Arthralgia Your caregiver has diagnosed you as suffering from an arthralgia. Arthralgia means there is pain in a joint. This can come from many reasons including:  Bruising the joint which causes soreness (inflammation) in the joint.   Wear and tear on the joints which occur as we grow older (osteoarthritis).   Overusing the joint.   Various forms of arthritis.   Infections of the joint.  Regardless of the cause of pain in your joint, most of these different pains respond to anti-inflammatory drugs and rest. The exception to this is when a joint is infected, and these cases are treated with antibiotics, if it is a bacterial infection. HOME CARE INSTRUCTIONS   Rest the injured area for as long as directed by your caregiver. Then slowly start using the joint as directed by your caregiver and as the pain allows. Crutches as directed may be useful if the ankles, knees or hips are involved. If the knee was splinted or casted, continue use and care as directed. If an stretchy or elastic wrapping bandage has been applied today, it should be removed and re-applied every 3 to 4 hours. It should not be applied tightly, but firmly enough to keep swelling down. Watch toes and feet for swelling, bluish discoloration, coldness, numbness or excessive pain. If any of these problems (symptoms) occur, remove the ace bandage and re-apply more loosely. If these symptoms persist, contact your caregiver or return to this location.   For the first 24 hours, keep the injured extremity elevated on pillows while lying down.   Apply ice for 15 to 20 minutes to the sore joint every couple hours while awake for the first half day. Then 3 to 4 times per day for the first 48 hours. Put the ice in a plastic bag and place a towel between the bag of ice and your skin.   Wear any splinting, casting, elastic bandage applications, or slings as instructed.   Only take over-the-counter or prescription medicines for pain,  discomfort, or fever as directed by your caregiver. Do not use aspirin immediately after the injury unless instructed by your physician. Aspirin can cause increased bleeding and bruising of the tissues.   If you were given crutches, continue to use them as instructed and do not resume weight bearing on the sore joint until instructed.  Persistent pain and inability to use the sore joint as directed for more than 2 to 3 days are warning signs indicating that you should see a caregiver for a follow-up visit as soon as possible. Initially, a hairline fracture (break in bone) may not be evident on X-rays. Persistent pain and swelling indicate that further evaluation, non-weight bearing or use of the joint (use of crutches or slings as instructed), or further X-rays are indicated. X-rays may sometimes not show a small fracture until a week or 10 days later. Make a follow-up appointment with your own caregiver or one to whom we have referred you. A radiologist (specialist in reading X-rays) may read your X-rays. Make sure you know how you are to obtain your X-ray results. Do not assume everything is normal if you do not hear from us. SEEK MEDICAL CARE IF: Bruising, swelling, or pain increases. SEEK IMMEDIATE MEDICAL CARE IF:   Your fingers or toes are numb or blue.   The pain is not responding to medications and continues to stay the same or get worse.   The pain in your joint becomes severe.   You develop a fever over   102 F (38.9 C).   It becomes impossible to move or use the joint.  MAKE SURE YOU:   Understand these instructions.   Will watch your condition.   Will get help right away if you are not doing well or get worse.  Document Released: 04/09/2005 Document Revised: 03/29/2011 Document Reviewed: 11/26/2007 [employer] Patient Information 2012 [employer].   Narcotic and benzodiazepine use may cause drowsiness, slowed breathing or dependence.  Please use with caution and do not drive,  operate machinery or watch young children alone while taking them.  Taking combinations of these medications or drinking alcohol will potentiate these effects.    

## 2011-07-21 NOTE — ED Notes (Signed)
Pt. Stated, I had shoulder suegery in [place]. And I'm just having a lot of pain in it now, I can't sleep

## 2011-07-23 ENCOUNTER — Telehealth: Payer: Self-pay

## 2011-07-23 MED ORDER — FUROSEMIDE 40 MG PO TABS: 40.0000 mg | ORAL_TABLET | Freq: Every day | ORAL | Status: DC | PRN

## 2011-07-23 NOTE — Telephone Encounter (Signed)
May try higher dose lasix - increase to 40mg . Only use as needed, if finding needs regularly, I want him to return for blood work. Regular use of this medicine when not needed can cause kidney problems.

## 2011-07-23 NOTE — Telephone Encounter (Signed)
Pt stated has talked with Dr Sharen Hones about increasing Furosemide before. Pt supposed to be taking Furosemide 20 mg taking 1/2 tab daily. Pt increased to 20 mg daily and still having problems with swelling in both legs and feet. Pt said his father had Furosemide 80 mg and pt has taken 80 mg for 2 days and his legs and feet are not swollen and he can wear his shoes with no problem. Pt wants to see if Dr Sharen Hones will increase his Furosemide dosage. Pt uses [facility] and can be reached at [phone].

## 2011-07-24 NOTE — Telephone Encounter (Signed)
Message left for patient to return my call.  

## 2011-07-24 NOTE — Telephone Encounter (Signed)
Patient notified and will increase lasix to 40 mg as needed. I stressed to him about only taking it when needed and if he found he was needing them regularly to call and let me know. He verbalized understanding and said he would use them sparingly.

## 2011-07-26 NOTE — ED Provider Notes (Signed)
History     CSN: [id number]  Arrival date & time 07/21/11  1346   First MD Initiated Contact with Patient 07/21/11 1445      Chief Complaint  Patient presents with  . Shoulder Pain    (Consider location/radiation/quality/duration/timing/severity/associated sxs/prior treatment) HPI Comments: Ports that he had some sort of shoulder surgery with orthopedist 2 months prior. The patient reports a few days ago he had reached up over his head which he reports he was told not to do with exacerbation of severe pain. He denies numbness or weakness. He reports he retains some range of motion, however movement does make the pain much worse. His chest pain, shortness of breath. No fevers. And some OTC medicines he reports no significant improvement. Following air any new injury. He denies any posterior neck pain.  Patient is a 61 y.o. male presenting with shoulder pain. The history is provided by the patient and medical records.  Shoulder Pain    Past Medical History  Diagnosis Date  . Chest pain     normal cath 2008.  CP deemed atypical and non cardiac by cards, ?discogenic  . GERD (gastroesophageal reflux disease)   . Hyperlipidemia   . Hypertension     Essential  . Anxiety   . Depression   . Stroke   . T2DM (type 2 diabetes mellitus)   . Esophageal stricture   . Orchitis and epididymitis, unspecified   . Back pain     with radiculopathy  . Circadian rhythm sleep disorder, irregular sleep-wake type   . Abdominal wall hernia   . Cocaine abuse     history    Past Surgical History  Procedure Date  . Tonsillectomy childhood  . Carpal tunnel release     Right  . Cardiac catheterization 5/04  . Trigger finger release     Right long finger  . Inguinal hernia repair     bilateral, 5 cm. RLQ Abd Lipoma Excision (Dr. Ezzard Standing)  . Esophagogastroduodenoscopy 07/17/07    Esoph. Stricture dilated (Dr. Marina Goodell)  . Anterior cervical discectomy 10/28/2008    with fusion (Dr. Shon Baton)  .  Hematoma evacuation 10/29/2008    (Dr. Shon Baton)  . Direct laryngoscopy 11/02/2008    Extubation under anesthesia (Dr. Lazarus Salines)    Family History  Problem Relation Age of Onset  . Diabetes Mother   . Hypertension Mother   . Hyperlipidemia Mother   . Diabetes Father   . Diabetes Sister   . Hypertension Sister   . Hypertension Brother   . Diabetes Brother   . Heart disease Maternal Uncle     Heart failure  . Cancer Paternal Uncle     Prostate  . Cancer Paternal Grandfather     Prostate  . Stroke Neg Hx   . Diabetes Brother   . Hypertension Brother   . Diabetes Brother     History  Substance Use Topics  . Smoking status: Never Smoker   . Smokeless tobacco: Not on file  . Alcohol Use: Yes     Occasionally      Review of Systems  Constitutional: Negative for fever, diaphoresis and activity change.  HENT: Negative for neck pain and neck stiffness.   Musculoskeletal: Positive for arthralgias. Negative for back pain.  Neurological: Negative for weakness and numbness.    Allergies  Review of patient's allergies indicates no known allergies.  Home Medications   Current Outpatient Rx  Name Route Sig Dispense Refill  . ASPIRIN EC 81 MG PO  TBEC Oral Take 81 mg by mouth daily.    Marland Kitchen BACLOFEN 10 MG PO TABS Oral Take 10 mg by mouth daily.    . CEPHALEXIN 500 MG PO CAPS Oral Take 500 mg by mouth daily.     . CHLORDIAZEPOXIDE HCL 5 MG PO CAPS Oral Take 5 mg by mouth 3 (three) times daily as needed. For alcohol withdrawals    . CINNAMON PO Oral Take 1 tablet by mouth daily.     Marland Kitchen FLUTICASONE PROPIONATE 50 MCG/ACT NA SUSP Nasal Place 2 sprays into the nose daily. 16 g 3  . GLIPIZIDE-METFORMIN HCL 5-500 MG PO TABS Oral Take 1 tablet by mouth daily. 180 tablet 3  . LISINOPRIL 10 MG PO TABS Oral Take 10 mg by mouth daily.    Marland Kitchen METHOCARBAMOL 500 MG PO TABS Oral Take 500 mg by mouth daily.     Marland Kitchen METOPROLOL SUCCINATE ER 50 MG PO TB24 Oral Take 50 mg by mouth daily. Take with or  immediately following a meal.    . MONTELUKAST SODIUM 10 MG PO TABS Oral Take 10 mg by mouth daily.    . FUROSEMIDE 40 MG PO TABS Oral Take 1 tablet (40 mg total) by mouth daily as needed (leg swelling). For edema - take as infrequently as possible 30 tablet 0  . GLUCOSE BLOOD VI STRP  Use as instructed bid, 250.02 100 each 12  . OXYCODONE-ACETAMINOPHEN 10-325 MG PO TABS Oral Take 1 tablet by mouth every 4 (four) hours as needed for pain. 20 tablet 0    BP 125/74  Pulse 97  Temp(Src) 98.7 F (37.1 C) (Oral)  Resp 20  SpO2 97%  Physical Exam  Nursing note and vitals reviewed. Constitutional: He appears well-developed and well-nourished. No distress.  Cardiovascular: Intact distal pulses.  Exam reveals no decreased pulses.   Musculoskeletal:       Right shoulder: He exhibits decreased range of motion, tenderness and pain. He exhibits no bony tenderness, no swelling, no effusion, no deformity, normal pulse and normal strength.  Neurological: He has normal strength. No sensory deficit.    ED Course  Procedures (including critical care time)  Labs Reviewed - No data to display No results found.   1. Shoulder pain       MDM   Reviewed the drug data base the patient apparently receives Percocet 10 mg on a monthly basis by a orthopedist. Interestingly, when I asked the patient he happened to have run out of his Percocet the day before coming to the emergency department. Prior about this and the patient reports that he does not have an appointment with his orthopedist for a couple of weeks, and he reports he did call his orthopedist who performed the shoulder surgery to get a quick appointment. He promises to call again come Monday to obtain a repeat appointment concerning his shoulder and to obtain further pain medications from them. My plan is to give him a prescription for analgesics to last until Monday.        Gavin Pound. Oletta Lamas, MD 07/26/11 1610

## 2011-08-13 ENCOUNTER — Other Ambulatory Visit: Payer: Self-pay | Admitting: *Deleted

## 2011-08-13 NOTE — Telephone Encounter (Signed)
Received faxed refill request from pharmacy. Is it okay to refill medication? 

## 2011-08-14 MED ORDER — CHLORDIAZEPOXIDE HCL 5 MG PO CAPS: 5.0000 mg | ORAL_CAPSULE | Freq: Three times a day (TID) | ORAL | Status: DC | PRN

## 2011-08-14 NOTE — Telephone Encounter (Signed)
plz phone in and notify pt. 

## 2011-08-14 NOTE — Telephone Encounter (Signed)
Pt came by office and would like call at [phone] when refill done.

## 2011-08-15 NOTE — Telephone Encounter (Signed)
Rx called in as directed. Pt aware.  

## 2011-08-22 DIAGNOSIS — Z87438 Personal history of other diseases of male genital organs: Secondary | ICD-10-CM

## 2011-08-22 HISTORY — DX: Personal history of other diseases of male genital organs: Z87.438

## 2011-08-25 ENCOUNTER — Emergency Department (HOSPITAL_COMMUNITY): Payer: Medicare Other

## 2011-08-25 ENCOUNTER — Encounter (HOSPITAL_COMMUNITY): Payer: Self-pay | Admitting: Emergency Medicine

## 2011-08-25 ENCOUNTER — Emergency Department (HOSPITAL_COMMUNITY)
Admission: EM | Admit: 2011-08-25 | Discharge: 2011-08-26 | Disposition: A | Discharge status: Home / Self Care | Payer: Medicare Other | Attending: Emergency Medicine | Admitting: Emergency Medicine

## 2011-08-25 DIAGNOSIS — R51 Headache: Secondary | ICD-10-CM | POA: Insufficient documentation

## 2011-08-25 DIAGNOSIS — R079 Chest pain, unspecified: Secondary | ICD-10-CM | POA: Insufficient documentation

## 2011-08-25 DIAGNOSIS — I10 Essential (primary) hypertension: Secondary | ICD-10-CM | POA: Insufficient documentation

## 2011-08-25 DIAGNOSIS — R000 Tachycardia, unspecified: Secondary | ICD-10-CM | POA: Insufficient documentation

## 2011-08-25 DIAGNOSIS — Z79899 Other long term (current) drug therapy: Secondary | ICD-10-CM | POA: Insufficient documentation

## 2011-08-25 DIAGNOSIS — E119 Type 2 diabetes mellitus without complications: Secondary | ICD-10-CM | POA: Insufficient documentation

## 2011-08-25 DIAGNOSIS — R42 Dizziness and giddiness: Secondary | ICD-10-CM | POA: Insufficient documentation

## 2011-08-25 DIAGNOSIS — N390 Urinary tract infection, site not specified: Secondary | ICD-10-CM

## 2011-08-25 DIAGNOSIS — R10817 Generalized abdominal tenderness: Secondary | ICD-10-CM | POA: Insufficient documentation

## 2011-08-25 DIAGNOSIS — R112 Nausea with vomiting, unspecified: Secondary | ICD-10-CM | POA: Insufficient documentation

## 2011-08-25 LAB — COMPREHENSIVE METABOLIC PANEL
Albumin: 4 g/dL (ref 3.5–5.2)
BUN: 10 mg/dL (ref 6–23)
Creatinine, Ser: 0.85 mg/dL (ref 0.50–1.35)
Total Protein: 7.8 g/dL (ref 6.0–8.3)

## 2011-08-25 LAB — URINE MICROSCOPIC-ADD ON

## 2011-08-25 LAB — CBC
HCT: 38.1 % — ABNORMAL LOW (ref 39.0–52.0)
Hemoglobin: 13.3 g/dL (ref 13.0–17.0)
MCH: 30.3 pg (ref 26.0–34.0)
MCHC: 34.9 g/dL (ref 30.0–36.0)

## 2011-08-25 LAB — LIPASE, BLOOD: Lipase: 23 U/L (ref 11–59)

## 2011-08-25 LAB — POCT I-STAT TROPONIN I

## 2011-08-25 LAB — DIFFERENTIAL
Basophils Relative: 0 % (ref 0–1)
Eosinophils Absolute: 0 10*3/uL (ref 0.0–0.7)
Monocytes Absolute: 1.3 10*3/uL — ABNORMAL HIGH (ref 0.1–1.0)
Monocytes Relative: 9 % (ref 3–12)

## 2011-08-25 LAB — URINALYSIS, ROUTINE W REFLEX MICROSCOPIC
Glucose, UA: NEGATIVE mg/dL
Protein, ur: NEGATIVE mg/dL
pH: 6.5 (ref 5.0–8.0)

## 2011-08-25 MED ORDER — FENTANYL CITRATE 0.05 MG/ML IJ SOLN
50.0000 ug | Freq: Once | INTRAMUSCULAR | Status: AC
  Administered 2011-08-25: 50 ug via INTRAVENOUS
  Filled 2011-08-25: qty 2

## 2011-08-25 MED ORDER — CEFTRIAXONE SODIUM 1 G IJ SOLR
1.0000 g | Freq: Once | INTRAMUSCULAR | Status: AC
  Administered 2011-08-25: 1 g via INTRAVENOUS
  Filled 2011-08-25: qty 10

## 2011-08-25 MED ORDER — KETOROLAC TROMETHAMINE 30 MG/ML IJ SOLN
30.0000 mg | Freq: Once | INTRAMUSCULAR | Status: AC
  Administered 2011-08-25: 30 mg via INTRAVENOUS
  Filled 2011-08-25: qty 1

## 2011-08-25 MED ORDER — METOCLOPRAMIDE HCL 5 MG/ML IJ SOLN
10.0000 mg | Freq: Once | INTRAMUSCULAR | Status: AC
  Administered 2011-08-25: 10 mg via INTRAVENOUS
  Filled 2011-08-25: qty 2

## 2011-08-25 MED ORDER — SODIUM CHLORIDE 0.9 % IV SOLN
INTRAVENOUS | Status: DC
  Administered 2011-08-25: 125 mL/h via INTRAVENOUS

## 2011-08-25 MED ORDER — SODIUM CHLORIDE 0.9 % IV BOLUS (SEPSIS)
500.0000 mL | Freq: Once | INTRAVENOUS | Status: AC
  Administered 2011-08-25: 500 mL via INTRAVENOUS

## 2011-08-25 NOTE — ED Provider Notes (Signed)
History     CSN: [id number]  Arrival date & time 08/25/11  1840   First MD Initiated Contact with Patient 08/25/11 2037     8:38 PM HPI    Patient is a 61 y.o. male presenting with headaches. The history is provided by the patient.  Headache  This is a new problem. The current episode started more than 2 days ago. The problem occurs constantly. The problem has been gradually worsening. The headache is associated with an unknown factor. The pain is located in the right unilateral region. Quality: constricting. The pain is severe. The pain does not radiate. Associated symptoms include nausea and vomiting. Pertinent negatives include no fever, no near-syncope, no orthopnea, no palpitations, no syncope and no shortness of breath. He has tried oral narcotic analgesics for the symptoms. The treatment provided no relief.    Past Medical History  Diagnosis Date  . Chest pain     normal cath 2008.  CP deemed atypical and non cardiac by cards, ?discogenic  . GERD (gastroesophageal reflux disease)   . Hyperlipidemia   . Hypertension     Essential  . Anxiety   . Depression   . Stroke   . T2DM (type 2 diabetes mellitus)   . Esophageal stricture   . Orchitis and epididymitis, unspecified   . Back pain     with radiculopathy  . Circadian rhythm sleep disorder, irregular sleep-wake type   . Abdominal wall hernia   . Cocaine abuse     history    Past Surgical History  Procedure Date  . Tonsillectomy childhood  . Carpal tunnel release     Right  . Cardiac catheterization 5/04  . Trigger finger release     Right long finger  . Inguinal hernia repair     bilateral, 5 cm. RLQ Abd Lipoma Excision (Dr. Ezzard Standing)  . Esophagogastroduodenoscopy 07/17/07    Esoph. Stricture dilated (Dr. Marina Goodell)  . Anterior cervical discectomy 10/28/2008    with fusion (Dr. Shon Baton)  . Hematoma evacuation 10/29/2008    (Dr. Shon Baton)  . Direct laryngoscopy 11/02/2008    Extubation under anesthesia (Dr. Lazarus Salines)      Family History  Problem Relation Age of Onset  . Diabetes Mother   . Hypertension Mother   . Hyperlipidemia Mother   . Diabetes Father   . Diabetes Sister   . Hypertension Sister   . Hypertension Brother   . Diabetes Brother   . Heart disease Maternal Uncle     Heart failure  . Cancer Paternal Uncle     Prostate  . Cancer Paternal Grandfather     Prostate  . Stroke Neg Hx   . Diabetes Brother   . Hypertension Brother   . Diabetes Brother     History  Substance Use Topics  . Smoking status: Never Smoker   . Smokeless tobacco: Not on file  . Alcohol Use: No     Occasionally      Review of Systems  Constitutional: Positive for chills. Negative for fever, diaphoresis and fatigue.  HENT: Negative for congestion, sore throat, rhinorrhea, neck pain, neck stiffness, postnasal drip and sinus pressure.   Eyes: Negative for photophobia, pain and visual disturbance.  Respiratory: Negative for cough, shortness of breath and wheezing.   Cardiovascular: Positive for chest pain. Negative for palpitations, orthopnea, leg swelling, syncope and near-syncope.  Gastrointestinal: Positive for nausea, vomiting and abdominal pain. Negative for diarrhea.  Genitourinary: Negative for dysuria, urgency, hematuria and flank pain.  Musculoskeletal:  Negative for myalgias and back pain.  Skin: Negative for rash.  Neurological: Positive for dizziness, weakness and headaches. Negative for syncope, facial asymmetry, speech difficulty and numbness.  All other systems reviewed and are negative.    Allergies  Review of patient's allergies indicates no known allergies.  Home Medications   Current Outpatient Rx  Name Route Sig Dispense Refill  . ASPIRIN EC 81 MG PO TBEC Oral Take 81 mg by mouth daily.    Marland Kitchen BACLOFEN 10 MG PO TABS Oral Take 10 mg by mouth daily.    . CHLORDIAZEPOXIDE HCL 5 MG PO CAPS Oral Take 1 capsule (5 mg total) by mouth 3 (three) times daily as needed. 90 capsule 0  .  CINNAMON PO Oral Take 1 tablet by mouth daily.     Marland Kitchen FLUTICASONE PROPIONATE 50 MCG/ACT NA SUSP Nasal Place 2 sprays into the nose daily. 16 g 3  . FUROSEMIDE 40 MG PO TABS Oral Take 40 mg by mouth daily. For edema - take as infrequently as possible    . GLIPIZIDE-METFORMIN HCL 5-500 MG PO TABS Oral Take 1 tablet by mouth daily. 180 tablet 3  . GLUCOSE BLOOD VI STRP  Use as instructed bid, 250.02 100 each 12  . LISINOPRIL 10 MG PO TABS Oral Take 10 mg by mouth daily.    Marland Kitchen METHOCARBAMOL 500 MG PO TABS Oral Take 500 mg by mouth daily.     Marland Kitchen METOPROLOL SUCCINATE ER 50 MG PO TB24 Oral Take 50 mg by mouth daily. Take with or immediately following a meal.    . MONTELUKAST SODIUM 10 MG PO TABS Oral Take 10 mg by mouth daily.    . OXYCODONE-ACETAMINOPHEN 10-325 MG PO TABS Oral Take 1 tablet by mouth every 4 (four) hours as needed. pain      BP 100/79  Pulse 132  Temp(Src) 99.3 F (37.4 C) (Oral)  Resp 18  SpO2 100%  Physical Exam  Constitutional: He is oriented to person, place, and time. He appears well-developed and well-nourished.  HENT:  Head: Normocephalic and atraumatic.  Eyes: Conjunctivae and EOM are normal. Pupils are equal, round, and reactive to light.  Neck: Normal range of motion. Neck supple. No spinous process tenderness and no muscular tenderness present. No Brudzinski's sign and no Kernig's sign noted.  Cardiovascular: Regular rhythm and normal heart sounds.  Tachycardia present.  Exam reveals no gallop and no friction rub.   No murmur heard. Pulmonary/Chest: Effort normal and breath sounds normal. He has no wheezes. He has no rales. He exhibits no tenderness.  Abdominal: Soft. Bowel sounds are normal. He exhibits no distension and no mass. There is tenderness (general). There is no rebound and no guarding.  Neurological: He is alert and oriented to person, place, and time. No cranial nerve deficit (tested CN III-XII). He exhibits normal muscle tone (NML strength with hand grip).  Coordination (NML finger to nose and heel to shin. No nystagmus) normal.  Skin: Skin is warm and dry. No rash noted. No erythema. No pallor.  Psychiatric: He has a normal mood and affect. His behavior is normal.    ED Course  Procedures  Results for orders placed during the hospital encounter of 08/25/11  CBC      Component Value Range   WBC 14.9 (*) 4.0 - 10.5 (K/uL)   RBC 4.39  4.22 - 5.81 (MIL/uL)   Hemoglobin 13.3  13.0 - 17.0 (g/dL)   HCT 47.8 (*) 29.5 - 52.0 (%)  MCV 86.8  78.0 - 100.0 (fL)   MCH 30.3  26.0 - 34.0 (pg)   MCHC 34.9  30.0 - 36.0 (g/dL)   RDW 91.4  78.2 - 95.6 (%)   Platelets 324  150 - 400 (K/uL)  DIFFERENTIAL      Component Value Range   Neutrophils Relative 82 (*) 43 - 77 (%)   Neutro Abs 12.3 (*) 1.7 - 7.7 (K/uL)   Lymphocytes Relative 9 (*) 12 - 46 (%)   Lymphs Abs 1.3  0.7 - 4.0 (K/uL)   Monocytes Relative 9  3 - 12 (%)   Monocytes Absolute 1.3 (*) 0.1 - 1.0 (K/uL)   Eosinophils Relative 0  0 - 5 (%)   Eosinophils Absolute 0.0  0.0 - 0.7 (K/uL)   Basophils Relative 0  0 - 1 (%)   Basophils Absolute 0.0  0.0 - 0.1 (K/uL)  COMPREHENSIVE METABOLIC PANEL      Component Value Range   Sodium 132 (*) 135 - 145 (mEq/L)   Potassium 4.0  3.5 - 5.1 (mEq/L)   Chloride 98  96 - 112 (mEq/L)   CO2 22  19 - 32 (mEq/L)   Glucose, Bld 183 (*) 70 - 99 (mg/dL)   BUN 10  6 - 23 (mg/dL)   Creatinine, Ser 2.13  0.50 - 1.35 (mg/dL)   Calcium 8.8  8.4 - 08.6 (mg/dL)   Total Protein 7.8  6.0 - 8.3 (g/dL)   Albumin 4.0  3.5 - 5.2 (g/dL)   AST 15  0 - 37 (U/L)   ALT 18  0 - 53 (U/L)   Alkaline Phosphatase 53  39 - 117 (U/L)   Total Bilirubin 0.3  0.3 - 1.2 (mg/dL)   GFR calc non Af Amer >90  >90 (mL/min)   GFR calc Af Amer >90  >90 (mL/min)  URINALYSIS, ROUTINE W REFLEX MICROSCOPIC      Component Value Range   Color, Urine YELLOW  YELLOW    APPearance CLEAR  CLEAR    Specific Gravity, Urine 1.013  1.005 - 1.030    pH 6.5  5.0 - 8.0    Glucose, UA NEGATIVE   NEGATIVE (mg/dL)   Hgb urine dipstick NEGATIVE  NEGATIVE    Bilirubin Urine NEGATIVE  NEGATIVE    Ketones, ur 15 (*) NEGATIVE (mg/dL)   Protein, ur NEGATIVE  NEGATIVE (mg/dL)   Urobilinogen, UA 0.2  0.0 - 1.0 (mg/dL)   Nitrite NEGATIVE  NEGATIVE    Leukocytes, UA SMALL (*) NEGATIVE   LIPASE, BLOOD      Component Value Range   Lipase 23  11 - 59 (U/L)  POCT I-STAT TROPONIN I      Component Value Range   Troponin i, poc 0.01  0.00 - 0.08 (ng/mL)   Comment 3           URINE MICROSCOPIC-ADD ON      Component Value Range   Squamous Epithelial / LPF RARE  RARE    WBC, UA 7-10  <3 (WBC/hpf)   RBC / HPF 0-2  <3 (RBC/hpf)   Bacteria, UA FEW (*) RARE   GLUCOSE, CAPILLARY      Component Value Range   Glucose-Capillary 190 (*) 70 - 99 (mg/dL)   Dg Chest 2 View  08/28/8467  *RADIOLOGY REPORT*  Clinical Data: 61 year old male with nausea vomiting dizziness.  CHEST - 2 VIEW  Comparison: 06/13/2009 and earlier.  Findings: Better lung volumes.  Stable cardiac size and mediastinal contours.  Ectatic thoracic aorta.  No pneumothorax, pulmonary edema, pleural effusion or confluent pulmonary opacity. Stable visualized osseous structures.  Postoperative changes to the lower cervical spine.  No pneumoperitoneum.  Negative visualized bowel gas pattern.  IMPRESSION: No acute cardiopulmonary abnormality.  Original Report Authenticated By: Harley Hallmark, M.D.   Ct Head Wo Contrast  08/25/2011  *RADIOLOGY REPORT*  Clinical Data: 61 year old male with headache.  CT HEAD WITHOUT CONTRAST  Technique:  Contiguous axial images were obtained from the base of the skull through the vertex without contrast.  Comparison: 06/13/2009.  Findings: Visualized paranasal sinuses and mastoids are clear. Visualized orbits and scalp soft tissues are within normal limits. No acute osseous abnormality identified.  Cerebral volume is within normal limits for age.  No midline shift, ventriculomegaly, mass effect, evidence of mass lesion,  intracranial hemorrhage or evidence of cortically based acute infarction.  Gray-white matter differentiation is within normal limits throughout the brain.  No suspicious intracranial vascular hyperdensity.  IMPRESSION: Stable and normal noncontrast CT appearance of the brain.  Original Report Authenticated By: Harley Hallmark, M.D.   ED ECG REPORT   Date: 08/26/2011  EKG Time: 12:33 AM  Rate: 120   Rhythm: sinus tachycardia   Axis: NML  Intervals:none  ST&T Change: nonspecific t wave changes  Narrative Interpretation: no significant changes since 06/13/2009             MDM    Reports headache and dizziness has resolved. Reports mild lower abdominal pain still and low back pain. Pain is not reproducible. Symptoms likely due to UTI. Will place on cipro and vicodin. Has received rocephin IV here in ED. Pt agrees with plan and is ready for d/c     Thomasene Lot, PA-C 08/26/11 0032  Thomasene Lot, PA-C 08/26/11 [phone]

## 2011-08-25 NOTE — ED Provider Notes (Signed)
Medical screening examination/treatment/procedure(s) were conducted as a shared visit with non-physician practitioner(s) and myself.  I personally evaluated the patient during the encounter  Patient here with headache cough fever. Patient has a urinary tract infection and will be given Rocephin and discharged home on medications for his urinary tract infection. No signs of meningitis at this time  Toy Baker, MD 08/25/11 2205

## 2011-08-25 NOTE — ED Notes (Signed)
Patient transported to CT 

## 2011-08-25 NOTE — ED Notes (Signed)
Pt presenting to ed with c/o dizziness and feeling like he was about to pass out. Pt states he's also had hot and cold chills. Pt states onset x 3-4 days with worsening symptoms today. Pt states left sided chest pain with positive nausea and vomiting. Pt states positive shortness of breath. Pt denies any radiating pain at this time.

## 2011-08-26 MED ORDER — CIPROFLOXACIN HCL 500 MG PO TABS: 500.0000 mg | ORAL_TABLET | Freq: Two times a day (BID) | ORAL | Status: DC

## 2011-08-26 MED ORDER — HYDROCODONE-ACETAMINOPHEN 5-325 MG PO TABS: 1.0000 | ORAL_TABLET | Freq: Four times a day (QID) | ORAL | Status: AC | PRN

## 2011-08-26 NOTE — ED Provider Notes (Signed)
Medical screening examination/treatment/procedure(s) were performed by non-physician practitioner and as supervising physician I was immediately available for consultation/collaboration.  Chelcie Estorga T Nylen Creque, MD 08/26/11 1049 

## 2011-08-27 ENCOUNTER — Encounter: Payer: Self-pay | Admitting: Family Medicine

## 2011-08-27 ENCOUNTER — Ambulatory Visit (INDEPENDENT_AMBULATORY_CARE_PROVIDER_SITE_OTHER): Payer: Medicare Other | Admitting: Family Medicine

## 2011-08-27 VITALS — BP 106/76 | HR 80 | Temp 99.2°F | Wt 239.2 lb

## 2011-08-27 DIAGNOSIS — R300 Dysuria: Secondary | ICD-10-CM

## 2011-08-27 DIAGNOSIS — N419 Inflammatory disease of prostate, unspecified: Secondary | ICD-10-CM | POA: Insufficient documentation

## 2011-08-27 DIAGNOSIS — R42 Dizziness and giddiness: Secondary | ICD-10-CM

## 2011-08-27 DIAGNOSIS — Z113 Encounter for screening for infections with a predominantly sexual mode of transmission: Secondary | ICD-10-CM

## 2011-08-27 LAB — POCT URINALYSIS DIPSTICK
Bilirubin, UA: NEGATIVE
Ketones, UA: NEGATIVE
Nitrite, UA: NEGATIVE
Urobilinogen, UA: 0.2
pH, UA: 7.5

## 2011-08-27 MED ORDER — CIPROFLOXACIN HCL 500 MG PO TABS: 500.0000 mg | ORAL_TABLET | Freq: Two times a day (BID) | ORAL | Status: AC

## 2011-08-27 NOTE — Assessment & Plan Note (Signed)
Continued dizziness complaint, bp a bit low today, as well as per pt report at ER.  Will drop lisinopril to 5mg  daily and reassess. BP Readings from Last 3 Encounters:  08/27/11 106/76  08/25/11 112/58  07/21/11 125/74

## 2011-08-27 NOTE — Progress Notes (Signed)
Subjective:    Patient ID: [name], male    DOB: 1951-03-07, 61 y.o.   MRN: [id number]  HPI CC: hosp f/u  Seen at [facility] ER 08/25/2011 with HA, n/v and dizziness, lower abd and lower back pain and found to have UTI, treated with IV CTX  and then 7d course of cipro and some vicodin.  noncontrast head CT obtained, normal.  All records reviewed: WBC 14.9 with ANC 12.3, Hgb 13.3, Na 132, glu 183, Cr 0.85, LFTs WNL, UA with small LE and 7/10 WBC/hpf, few bact.  Lipase 23, TnI 0.01.  CxR - no acute process, head CT - stable and normal.  EKG - sinus tachy at 120, nonspecific T wave changes, unchanged.  States did not fill vicodin provided because under pain contract with percocets.  States sxs started over last 5-6 days - lower abdominal pain and "hotness" associated with dizziness when standing.  Endorses dysuria for last 2-3 months, worsening this week.  Never mentioned any problems with this in past.  Called neighbor who took him to ER.  Told blood pressure was a bit low.  Found to have UTI, but no culture was sent off.  Has not been checking temperature but endorses subjective fevers and chills.  Has been drinking hot coffee when got chills.  abd pain feels like squeezing pain worse with movement.  No nausea/vomiting, diarrhea/constipation, gassiness, bloating, indigestion or reflux sxs.  Increased BMs recently, loose stools not watery.  No blood in stool.  Continued burning throughout stream when voiding.  No change in appetite, no change in weight.  Continued dizziness.    Endorses urethral discharge, noted for the last year but never mentioned this, with ejaculation describes watery discharge, not semen.  Not sexually active in years.  Brings logs of sugars fasting and night time, no lows.  Ranging from 80s to 200.  Prostate exam and PSA (1.00) normal in July  Wt Readings from Last 3 Encounters:  08/27/11 239 lb 4 oz (108.523 kg)  07/12/11 237 lb 8 oz (107.729 kg)  06/19/11 241 lb  (109.317 kg)    Lab Results  Component Value Date   HGBA1C 7.2* 04/06/2011   BP Readings from Last 3 Encounters:  08/27/11 106/76  08/25/11 112/58  07/21/11 125/74     Medications and allergies reviewed and updated in chart.  Past histories reviewed and updated if relevant as below. Patient Active Problem List  Diagnoses  . LIPOMA  . DIABETES MELLITUS, TYPE II  . HYPERCHOLESTEROLEMIA, PURE  . ANEMIA, IRON DEFICIENCY  . ANXIETY DEPRESSION  . ESSENTIAL HYPERTENSION  . TIA  . ESOPHAGEAL STRICTURE  . GERD  . ABDOMINAL WALL HERNIA  . CALCIFIC TENDINITIS  . BACK PAIN WITH RADICULOPATHY  . COCAINE ABUSE, HX OF  . Rhinitis  . Routine general medical examination at a health care facility  . Edema  . Neck pain, chronic  . Shoulder pain  . Insomnia  . Dizziness   Past Medical History  Diagnosis Date  . Chest pain     normal cath 2008.  CP deemed atypical and non cardiac by cards, ?discogenic  . GERD (gastroesophageal reflux disease)   . Hyperlipidemia   . Hypertension     Essential  . Anxiety   . Depression   . Stroke   . T2DM (type 2 diabetes mellitus)   . Esophageal stricture   . Orchitis and epididymitis, unspecified   . Back pain     with radiculopathy  .  Circadian rhythm sleep disorder, irregular sleep-wake type   . Abdominal wall hernia   . Cocaine abuse     history   Past Surgical History  Procedure Date  . Tonsillectomy childhood  . Carpal tunnel release     Right  . Cardiac catheterization 5/04  . Trigger finger release     Right long finger  . Inguinal hernia repair     bilateral, 5 cm. RLQ Abd Lipoma Excision (Dr. Ezzard Standing)  . Esophagogastroduodenoscopy 07/17/07    Esoph. Stricture dilated (Dr. Marina Goodell)  . Anterior cervical discectomy 10/28/2008    with fusion (Dr. Shon Baton)  . Hematoma evacuation 10/29/2008    (Dr. Shon Baton)  . Direct laryngoscopy 11/02/2008    Extubation under anesthesia (Dr. Lazarus Salines)   History  Substance Use Topics  . Smoking  status: Never Smoker   . Smokeless tobacco: Not on file  . Alcohol Use: No     Occasionally   Family History  Problem Relation Age of Onset  . Diabetes Mother   . Hypertension Mother   . Hyperlipidemia Mother   . Diabetes Father   . Diabetes Sister   . Hypertension Sister   . Hypertension Brother   . Diabetes Brother   . Heart disease Maternal Uncle     Heart failure  . Cancer Paternal Uncle     Prostate  . Cancer Paternal Grandfather     Prostate  . Stroke Neg Hx   . Diabetes Brother   . Hypertension Brother   . Diabetes Brother    No Known Allergies Current Outpatient Prescriptions on File Prior to Visit  Medication Sig Dispense Refill  . baclofen (LIORESAL) 10 MG tablet Take 10 mg by mouth daily.      . chlordiazePOXIDE (LIBRIUM) 5 MG capsule Take 1 capsule (5 mg total) by mouth 3 (three) times daily as needed.  90 capsule  0  . CINNAMON PO Take 1 tablet by mouth daily.       . ciprofloxacin (CIPRO) 500 MG tablet Take 1 tablet (500 mg total) by mouth 2 (two) times daily.  14 tablet  0  . fluticasone (FLONASE) 50 MCG/ACT nasal spray Place 2 sprays into the nose daily.  16 g  3  . furosemide (LASIX) 40 MG tablet Take 40 mg by mouth daily. For edema - take as infrequently as possible      . glipiZIDE-metformin (METAGLIP) 5-500 MG per tablet Take 1 tablet by mouth daily.  180 tablet  3  . glucose blood (ONE TOUCH TEST STRIPS) test strip Use as instructed bid, 250.02  100 each  12  . HYDROcodone-acetaminophen (NORCO) 5-325 MG per tablet Take 1-2 tablets by mouth every 6 (six) hours as needed for pain.  15 tablet  0  . lisinopril (PRINIVIL,ZESTRIL) 10 MG tablet Take 10 mg by mouth daily.      . methocarbamol (ROBAXIN) 500 MG tablet Take 500 mg by mouth daily.       . metoprolol succinate (TOPROL-XL) 50 MG 24 hr tablet Take 50 mg by mouth daily. Take with or immediately following a meal.      . montelukast (SINGULAIR) 10 MG tablet Take 10 mg by mouth daily.      Marland Kitchen  oxyCODONE-acetaminophen (PERCOCET) 10-325 MG per tablet Take 1 tablet by mouth every 4 (four) hours as needed. pain      . aspirin EC 81 MG tablet Take 81 mg by mouth daily.         Review of  Systems Per HPI    Objective:   Physical Exam  Nursing note and vitals reviewed. Constitutional: He appears well-developed and well-nourished. No distress.  HENT:  Head: Normocephalic and atraumatic.  Mouth/Throat: Oropharynx is clear and moist. No oropharyngeal exudate.  Eyes: Conjunctivae and EOM are normal. Pupils are equal, round, and reactive to light. No scleral icterus.  Neck: Normal range of motion. Neck supple.  Cardiovascular: Normal rate, regular rhythm, normal heart sounds and intact distal pulses.   No murmur heard. Pulmonary/Chest: Effort normal and breath sounds normal. No respiratory distress. He has no wheezes. He has no rales.  Abdominal: Soft. Bowel sounds are normal. He exhibits no distension and no mass. There is tenderness in the suprapubic area. There is no rigidity, no guarding and negative Murphy's sign.  Genitourinary: Penis normal. Rectal exam shows no external hemorrhoid, no mass, no tenderness and anal tone normal. Prostate is enlarged and tender (very tender, boggy). Uncircumcised.       CT/GC genital probe obtained, no discharge noted  Musculoskeletal:       Mild lower back pain to palpation  Lymphadenopathy:       Right: No inguinal adenopathy present.       Left: No inguinal adenopathy present.      Assessment & Plan:

## 2011-08-27 NOTE — Assessment & Plan Note (Addendum)
Reviewed records.  Exam and story consistent with prostatitis - collected urine - UA suspicious for continued infection however micro not so. Sent off UCx. Check WBC to ensure dropping, if elevated consider change in abx therapy.  Check PSA. Discussed prostatitis dx and provided with handout.  Will prolong abx course for total of 3 weeks. sent off CT/GC as well given endorsed urethral discharge (although unsure if truly discharge from description, seems low risk). asked him to return in 2d for f/u.

## 2011-08-27 NOTE — Patient Instructions (Signed)
Cut lisinopril tablet in half - down to 5mg  daily.  We will see if this helps dizziness sensation. I think you have prostatitis - infection of prostate.  Treat with cipro twice daily for next 3 weeks - I've prolonged antibiotic course. Push fluids and rest.  Buy thermometer to keep eye on temperature. Return in 2-3 days for follow up. We have sent culture to ensure on correct antibiotic.  Prostatitis Prostatitis is an inflammation (the body's way of reacting to injury and/or infection) of the prostate gland. The prostate gland is a male organ. The gland is about the size and shape of a walnut. The prostate is located just below the bladder. It produces semen, which is a fluid that helps nourish and transport sperm. Prostatitis is the most common urinary tract problem in men younger than age 13. There are 4 categories of prostatitis:  I - Acute bacterial prostatitis.   II - Chronic bacterial prostatitis.   III - Chronic prostatitis and chronic pelvic pain syndrome (CPPS).   Inflammatory.   Non inflammatory.   IV - Asymptomatic inflammatory prostatitis.  Acute and chronic bacterial prostatitis are problems with bacterial infections of the prostate. "Acute" infection is usually a one-time problem. "Chronic" bacterial prostatitis is a condition with recurrent infection. It is usually caused by the same germ(bacteria). CPPS has symptoms similar to prostate infection. However, no infection is actually found. This condition can cause problems of ongoing pain. Currently, it cannot be cured. Treatments are available and aimed at symptom control.  Asymptomatic inflammatory prostatitis has no symptoms. It is a condition where infection-fighting cells are found by chance in the urine. The diagnosis is made most often during an exam for other conditions. Other conditions could be infertility or a high level of PSA (prostate-specific antigen) in the blood. SYMPTOMS  Symptoms can vary depending upon the  type of prostatitis that exists. There can also be overlap in symptoms. This can make diagnosis difficult. Symptoms: For Acute bacterial prostatitis  Painful urination.   Fever or chills.   Muscle or joint pains.   Low back pain.   Low abdominal pain.   Inability to empty bladder completely.   Sudden urges to urinate.   Frequent urination during the day.   Difficulty starting urine stream.   Need to urinate several times at night (nocturia).   Weak urine stream.   Urethral (tube that carries urine from the bladder out of the body) discharge and dribbling after urination.  For Chronic bacterial prostatitis  Rectal pain.   Pain in the testicles, penis, or tip of the penis.   Pain in the space between the anus and scrotum (perineum).   Low back pain.   Low abdominal pain.   Problems with sexual function.   Painful ejaculation.   Bloody semen.   Inability to empty bladder completely.   Painful urination.   Sudden urges to urinate.   Frequent urination during the day.   Difficulty starting urine stream.   Need to urinate several times at night (nocturia).   Weak urine stream.   Dribbling after urination.   Urethral discharge.  For Chronic prostatitis and chronic pelvic pain syndrome (CPPS) Symptoms are the same as those for chronic bacterial prostatitis. Problems with sexual function are often the reason for seeking care. This important problem should be discussed with your caregiver. For Asymptomatic inflammatory prostatitis As noted above, there are no symptoms with this condition. DIAGNOSIS   Your caregiver may perform a rectal exam. This exam  is to determine if the prostate is swollen and tender.   Sometimes blood work is performed. This is done to see if your white blood cell count is elevated. The Prostate Specific Antigen (PSA) is also measured. PSA is a blood test that can help detect early prostate cancer.   A urinalysis is done to find out  what type of infection is present if this is a suspected cause. An additional urinalysis may be done after a digital rectal exam. This is to see if white blood cells are pushed out of the prostate and into the urine. A low-grade infection of the prostate may not be found on the first urinalysis.  In more difficult cases, your caregiver may advise other tests. Tests could include:  Urodynamics -- Tests the function of the bladder and the organs involved in triggering and controlling normal urination.   Urine flow rate.   Cystoscopy -- In this procedure, a thin, telescope-like tube with a light and tiny camera attached (cystoscope) is inserted into the bladder through the urethra. This allows the caregiver to see the inside of the urethra and bladder.   Electromyography -- This procedure tests how the muscles and nerves of the bladder work. It is focused on the muscles that control the anus and pelvic floor. These are the muscles between the anus and scrotum.  In people who show no signs of infection, certain uncommon infections might be causing constant or recurrent symptoms. These uncommon infections are difficult to detect. More work in medicine may help find solutions to these problems. TREATMENT  Antibiotics are used to treat infections caused by germs. If the infection is not treated and becomes long lasting (chronic), it may become a lower grade infection with minor, continual problems. Without treatment, the prostate may develop a boil or furuncle (abscess). This may require surgical treatment. For those with chronic prostatitis and CPPS, it is important to work closely with your primary caregiver and urologist. For some, the medicines that are used to treat a non-cancerous, enlarged prostate (benign prostatic hypertrophy) may be helpful. Referrals to specialists other than urologists may be necessary. In rare cases when all treatments have been inadequate for pain control, an operation to remove  the prostate may be recommended. This is very rare and before this is considered thorough discussion with your urologist is highly recommended.  In cases of secondary to chronic non-bacterial prostatitis, a good relationship with your urologist or primary caregiver is essential because it is often a recurrent prolonged condition that requires a good understanding of the causes and a commitment to therapy aimed at controlling your symptoms. HOME CARE INSTRUCTIONS   Hot sitz baths for 20 minutes, 4 times per day, may help relieve pain.   Non-prescription pain killers may be used as your caregiver recommends if you have no allergies to them. Some illnesses or conditions prevent use of non-prescription drugs. If unsure, check with your caregiver. Take all medications as directed. Take the antibiotics for the prescribed length of time, even if you are feeling better.  SEEK MEDICAL CARE IF:   You have any worsening of the symptoms that originally brought you to your caregiver.   You have an oral temperature above 102 F (38.9 C).   You experience any side effects from medications prescribed.  SEEK IMMEDIATE MEDICAL CARE IF:   You have an oral temperature above 102 F (38.9 C), not controlled by medicine.   You have pain not relieved with medications.   You develop  nausea, vomiting, lightheadedness, or have a fainting episode.   You are unable to urinate.   You pass bloody urine or clots.  Document Released: 04/06/2000 Document Revised: 03/29/2011 Document Reviewed: 03/12/2011 [facility] Patient Information 2012 [place], [place].

## 2011-08-28 LAB — CBC WITH DIFFERENTIAL/PLATELET
Basophils Relative: 0.5 % (ref 0.0–3.0)
Eosinophils Absolute: 0.2 10*3/uL (ref 0.0–0.7)
Eosinophils Relative: 2 % (ref 0.0–5.0)
Hemoglobin: 13 g/dL (ref 13.0–17.0)
Lymphocytes Relative: 17.4 % (ref 12.0–46.0)
MCHC: 33.4 g/dL (ref 30.0–36.0)
Monocytes Relative: 15.8 % — ABNORMAL HIGH (ref 3.0–12.0)
Neutro Abs: 5.9 10*3/uL (ref 1.4–7.7)
RBC: 4.28 Mil/uL (ref 4.22–5.81)

## 2011-08-28 LAB — BASIC METABOLIC PANEL
CO2: 28 mEq/L (ref 19–32)
Chloride: 99 mEq/L (ref 96–112)
Glucose, Bld: 109 mg/dL — ABNORMAL HIGH (ref 70–99)
Potassium: 4 mEq/L (ref 3.5–5.1)
Sodium: 138 mEq/L (ref 135–145)

## 2011-08-28 LAB — GC/CHLAMYDIA PROBE AMP, GENITAL
Chlamydia, DNA Probe: NEGATIVE
GC Probe Amp, Genital: NEGATIVE

## 2011-08-29 ENCOUNTER — Ambulatory Visit (INDEPENDENT_AMBULATORY_CARE_PROVIDER_SITE_OTHER): Payer: Medicare Other | Admitting: Family Medicine

## 2011-08-29 ENCOUNTER — Encounter: Payer: Self-pay | Admitting: Family Medicine

## 2011-08-29 VITALS — BP 108/70 | HR 68 | Temp 97.9°F | Wt 239.0 lb

## 2011-08-29 DIAGNOSIS — N419 Inflammatory disease of prostate, unspecified: Secondary | ICD-10-CM

## 2011-08-29 MED ORDER — LISINOPRIL 10 MG PO TABS: 5.0000 mg | ORAL_TABLET | Freq: Every day | ORAL | Status: DC

## 2011-08-29 NOTE — Assessment & Plan Note (Signed)
With typical sxs and elevated PSA to 11 - will need recheck in 3-6 mo. Discussed cipro and risk of tendon rupture, if any tendon pain to let us know right away.

## 2011-08-29 NOTE — Progress Notes (Signed)
  Subjective:    Patient ID: Annetta Maw, male    DOB: 23-Aug-1950, 61 y.o.   MRN: [id number]  HPI CC: 2d f/u  Seen at ER then here 2 d ago with dx prostatitis, did have hematuria x 1 after DRE done in office.    No fevers/chills.  Still mild dysuria.  Lower abd/back pain improving slowly.  Overall feeling better, feeling more reassured as now knows what is problem.  Prior visit decreased lisinopril to 5mg  daily to see if would help with dizziness.  Took first 5 mg dose this morning and today bp staying well controlled.  Has f/u appt scheduled for end of month.  Reviewed blood work with pt.  Awaiting HIV/RPR tests.  Review of Systems Per HPI    Objective:   Physical Exam  Nursing note and vitals reviewed. Constitutional: He appears well-developed and well-nourished. No distress.       Assessment & Plan:

## 2011-09-04 ENCOUNTER — Ambulatory Visit: Payer: Medicare Other | Admitting: Family Medicine

## 2011-09-06 ENCOUNTER — Encounter: Payer: Self-pay | Admitting: Family Medicine

## 2011-09-11 ENCOUNTER — Encounter: Payer: Self-pay | Admitting: Family Medicine

## 2011-09-11 ENCOUNTER — Ambulatory Visit (INDEPENDENT_AMBULATORY_CARE_PROVIDER_SITE_OTHER): Payer: Medicare Other | Admitting: Family Medicine

## 2011-09-11 ENCOUNTER — Telehealth: Payer: Self-pay | Admitting: Family Medicine

## 2011-09-11 VITALS — BP 118/80 | HR 88 | Temp 98.2°F | Wt 239.8 lb

## 2011-09-11 DIAGNOSIS — E119 Type 2 diabetes mellitus without complications: Secondary | ICD-10-CM

## 2011-09-11 DIAGNOSIS — N419 Inflammatory disease of prostate, unspecified: Secondary | ICD-10-CM

## 2011-09-11 DIAGNOSIS — I10 Essential (primary) hypertension: Secondary | ICD-10-CM

## 2011-09-11 DIAGNOSIS — F529 Unspecified sexual dysfunction not due to a substance or known physiological condition: Secondary | ICD-10-CM

## 2011-09-11 DIAGNOSIS — G459 Transient cerebral ischemic attack, unspecified: Secondary | ICD-10-CM

## 2011-09-11 DIAGNOSIS — N539 Unspecified male sexual dysfunction: Secondary | ICD-10-CM

## 2011-09-11 MED ORDER — SILDENAFIL CITRATE 50 MG PO TABS: 50.0000 mg | ORAL_TABLET | Freq: Every day | ORAL | Status: DC | PRN

## 2011-09-11 MED ORDER — METFORMIN HCL 850 MG PO TABS: 850.0000 mg | ORAL_TABLET | Freq: Two times a day (BID) | ORAL | Status: DC

## 2011-09-11 NOTE — Telephone Encounter (Signed)
Message left advising patient. Advised to call with any questions or concerns.

## 2011-09-11 NOTE — Telephone Encounter (Signed)
Please notify I've sent in trial of viagra - to try and let me know if not improving for referral to urologist.  Watch for headaches, if erection longer than 4 hours needs to seek urgent medical care.

## 2011-09-11 NOTE — Assessment & Plan Note (Signed)
Discussed need to take aspirin daily.

## 2011-09-11 NOTE — Patient Instructions (Addendum)
You need to take a baby aspirin daily, I recommend 81mg  enteric coated. Change in diabetes medicine - take metformin 850mg  once daily at breakfast. Stop lasix as this may be causing some dizziness.  Come in if you see leg swelling. I will check into things and may refer you to urologist for further evaluation.

## 2011-09-11 NOTE — Assessment & Plan Note (Signed)
Not compliant with meds.  Will change from metformin/glip to higher dose of metformin alone 850mg  daily. Encouraged daily use, return in 3 mo for recheck diabetes. Foot exam today.

## 2011-09-11 NOTE — Assessment & Plan Note (Signed)
Great control even on lower dose of ACEI. Recommended stopping furosemide for swelling and rather to return when leg swelling present to further evaluate for cause.

## 2011-09-11 NOTE — Assessment & Plan Note (Signed)
resolving slowly

## 2011-09-11 NOTE — Assessment & Plan Note (Signed)
With erectile and ejaculatory dysfunction, ?retrograde ejaculation. Will start with trial of PDE 5 inhibitor and update [place] if sxs not improved for referral to urologist. Currently receiving treatment for prostatitis, likely contributing some to this. Also discussed association of other medical conditions (HLD, HTN, DM) on ED and med contribution (on narcotic, on ACEI and on beta blocker).

## 2011-09-11 NOTE — Progress Notes (Signed)
Subjective:    Patient ID: [name], male    DOB: 1951-01-12, 60 y.o.   MRN: [id number]  HPI CC: 2 mo f/u  Currently issue with sex drive.  Erectile dysfunction noted for last few years.  With ejaculation at times notes dry orgasm - no semen, sometimes clear fluid.  Has not seen urologist for 10-15 yrs.  Has never been on viagra or other antiPDE5.  Recent prostatitis - taking cipro twice daily for last several weeks.  Overall improving.    DM - states ran out of glip/metformin for last week. "I just haven't had time to fill it".  Brings log of sugars showing fasting ranging 90-165.  Discussed importance of compliance with meds. Lab Results  Component Value Date   HGBA1C 7.2* 04/06/2011   Improved dizziness on decreased dose of ACEI.  Denies h/o STI.  Past Medical History  Diagnosis Date  . Chest pain     normal cath 2008.  CP deemed atypical and non cardiac by cards, ?discogenic  . GERD (gastroesophageal reflux disease)   . Hyperlipidemia   . Hypertension     Essential  . Anxiety   . Depression   . Stroke   . T2DM (type 2 diabetes mellitus)   . Esophageal stricture   . Orchitis and epididymitis, unspecified   . Back pain     with radiculopathy  . Circadian rhythm sleep disorder, irregular sleep-wake type   . Abdominal wall hernia   . Cocaine abuse     history   Past Surgical History  Procedure Date  . Tonsillectomy childhood  . Carpal tunnel release     Right  . Cardiac catheterization 5/04  . Trigger finger release     Right long finger  . Inguinal hernia repair     bilateral, 5 cm. RLQ Abd Lipoma Excision (Dr. Ezzard Standing)  . Esophagogastroduodenoscopy 07/17/07    Esoph. Stricture dilated (Dr. Marina Goodell)  . Anterior cervical discectomy 10/28/2008    with fusion (Dr. Shon Baton)  . Hematoma evacuation 10/29/2008    (Dr. Shon Baton)  . Direct laryngoscopy 11/02/2008    Extubation under anesthesia (Dr. Lazarus Salines)    Review of Systems Per HPI    Objective:   Physical Exam  Nursing note and vitals reviewed. Constitutional: He appears well-developed and well-nourished. No distress.  HENT:  Head: Normocephalic and atraumatic.  Right Ear: External ear normal.  Left Ear: External ear normal.  Nose: Nose normal.  Mouth/Throat: Oropharynx is clear and moist. No oropharyngeal exudate.  Eyes: Conjunctivae and EOM are normal. Pupils are equal, round, and reactive to light. No scleral icterus.  Neck: Normal range of motion. Neck supple. Carotid bruit is not present.  Cardiovascular: Normal rate, regular rhythm, normal heart sounds and intact distal pulses.   No murmur heard. Pulmonary/Chest: Effort normal and breath sounds normal. No respiratory distress. He has no wheezes. He has no rales.  Genitourinary: Testes normal and penis normal. Right testis shows no mass, no swelling and no tenderness. Right testis is descended. Left testis shows no mass, no swelling and no tenderness. Left testis is descended. Uncircumcised.  Musculoskeletal: He exhibits no edema.       Diabetic foot exam: Normal inspection No skin breakdown No calluses  Normal DP/PT pulses Normal sensation to light tough and monofilament Nails normal  Lymphadenopathy:    He has no cervical adenopathy.  Skin: Skin is warm and dry. No rash noted.  Psychiatric: He has a normal mood and affect.  Assessment & Plan:

## 2011-09-18 ENCOUNTER — Ambulatory Visit: Payer: Medicare Other | Admitting: Family Medicine

## 2011-10-09 ENCOUNTER — Inpatient Hospital Stay (HOSPITAL_COMMUNITY)
Admission: EM | Admit: 2011-10-09 | Discharge: 2011-10-11 | DRG: 379 | Disposition: A | Discharge status: Home / Self Care | Payer: Medicare Other | Attending: Internal Medicine | Admitting: Internal Medicine

## 2011-10-09 ENCOUNTER — Encounter (HOSPITAL_COMMUNITY): Payer: Self-pay

## 2011-10-09 ENCOUNTER — Emergency Department (HOSPITAL_COMMUNITY): Payer: Medicare Other

## 2011-10-09 ENCOUNTER — Telehealth: Payer: Self-pay | Admitting: Family Medicine

## 2011-10-09 DIAGNOSIS — F329 Major depressive disorder, single episode, unspecified: Secondary | ICD-10-CM | POA: Diagnosis present

## 2011-10-09 DIAGNOSIS — J310 Chronic rhinitis: Secondary | ICD-10-CM

## 2011-10-09 DIAGNOSIS — E1165 Type 2 diabetes mellitus with hyperglycemia: Secondary | ICD-10-CM

## 2011-10-09 DIAGNOSIS — R1319 Other dysphagia: Secondary | ICD-10-CM

## 2011-10-09 DIAGNOSIS — G8929 Other chronic pain: Secondary | ICD-10-CM

## 2011-10-09 DIAGNOSIS — F3289 Other specified depressive episodes: Secondary | ICD-10-CM | POA: Diagnosis present

## 2011-10-09 DIAGNOSIS — N539 Unspecified male sexual dysfunction: Secondary | ICD-10-CM

## 2011-10-09 DIAGNOSIS — G459 Transient cerebral ischemic attack, unspecified: Secondary | ICD-10-CM

## 2011-10-09 DIAGNOSIS — E785 Hyperlipidemia, unspecified: Secondary | ICD-10-CM | POA: Diagnosis present

## 2011-10-09 DIAGNOSIS — R1310 Dysphagia, unspecified: Secondary | ICD-10-CM

## 2011-10-09 DIAGNOSIS — D509 Iron deficiency anemia, unspecified: Secondary | ICD-10-CM

## 2011-10-09 DIAGNOSIS — K5790 Diverticulosis of intestine, part unspecified, without perforation or abscess without bleeding: Secondary | ICD-10-CM

## 2011-10-09 DIAGNOSIS — Z9189 Other specified personal risk factors, not elsewhere classified: Secondary | ICD-10-CM

## 2011-10-09 DIAGNOSIS — I10 Essential (primary) hypertension: Secondary | ICD-10-CM

## 2011-10-09 DIAGNOSIS — K219 Gastro-esophageal reflux disease without esophagitis: Secondary | ICD-10-CM

## 2011-10-09 DIAGNOSIS — D179 Benign lipomatous neoplasm, unspecified: Secondary | ICD-10-CM

## 2011-10-09 DIAGNOSIS — K921 Melena: Secondary | ICD-10-CM

## 2011-10-09 DIAGNOSIS — G4700 Insomnia, unspecified: Secondary | ICD-10-CM

## 2011-10-09 DIAGNOSIS — K5731 Diverticulosis of large intestine without perforation or abscess with bleeding: Principal | ICD-10-CM | POA: Diagnosis present

## 2011-10-09 DIAGNOSIS — K648 Other hemorrhoids: Secondary | ICD-10-CM

## 2011-10-09 DIAGNOSIS — F411 Generalized anxiety disorder: Secondary | ICD-10-CM | POA: Diagnosis present

## 2011-10-09 DIAGNOSIS — R609 Edema, unspecified: Secondary | ICD-10-CM

## 2011-10-09 DIAGNOSIS — E7800 Pure hypercholesterolemia, unspecified: Secondary | ICD-10-CM

## 2011-10-09 DIAGNOSIS — R42 Dizziness and giddiness: Secondary | ICD-10-CM

## 2011-10-09 DIAGNOSIS — M6520 Calcific tendinitis, unspecified site: Secondary | ICD-10-CM

## 2011-10-09 DIAGNOSIS — Z7982 Long term (current) use of aspirin: Secondary | ICD-10-CM

## 2011-10-09 DIAGNOSIS — E119 Type 2 diabetes mellitus without complications: Secondary | ICD-10-CM

## 2011-10-09 DIAGNOSIS — IMO0002 Reserved for concepts with insufficient information to code with codable children: Secondary | ICD-10-CM

## 2011-10-09 DIAGNOSIS — Z8673 Personal history of transient ischemic attack (TIA), and cerebral infarction without residual deficits: Secondary | ICD-10-CM

## 2011-10-09 DIAGNOSIS — M25519 Pain in unspecified shoulder: Secondary | ICD-10-CM

## 2011-10-09 DIAGNOSIS — Z0000 Encounter for general adult medical examination without abnormal findings: Secondary | ICD-10-CM

## 2011-10-09 DIAGNOSIS — N419 Inflammatory disease of prostate, unspecified: Secondary | ICD-10-CM

## 2011-10-09 DIAGNOSIS — K625 Hemorrhage of anus and rectum: Secondary | ICD-10-CM

## 2011-10-09 DIAGNOSIS — K439 Ventral hernia without obstruction or gangrene: Secondary | ICD-10-CM

## 2011-10-09 DIAGNOSIS — F341 Dysthymic disorder: Secondary | ICD-10-CM

## 2011-10-09 DIAGNOSIS — Z79899 Other long term (current) drug therapy: Secondary | ICD-10-CM

## 2011-10-09 HISTORY — DX: Other chest pain: R07.89

## 2011-10-09 HISTORY — DX: Adverse effect of unspecified anesthetic, initial encounter: T41.45XA

## 2011-10-09 LAB — PROTIME-INR: Prothrombin Time: 13 seconds (ref 11.6–15.2)

## 2011-10-09 LAB — COMPREHENSIVE METABOLIC PANEL
ALT: 17 U/L (ref 0–53)
Albumin: 3.9 g/dL (ref 3.5–5.2)
Alkaline Phosphatase: 55 U/L (ref 39–117)
BUN: 13 mg/dL (ref 6–23)
Chloride: 101 mEq/L (ref 96–112)
Glucose, Bld: 144 mg/dL — ABNORMAL HIGH (ref 70–99)
Potassium: 3.8 mEq/L (ref 3.5–5.1)
Sodium: 137 mEq/L (ref 135–145)
Total Bilirubin: 0.2 mg/dL — ABNORMAL LOW (ref 0.3–1.2)

## 2011-10-09 LAB — URINALYSIS, ROUTINE W REFLEX MICROSCOPIC
Ketones, ur: NEGATIVE mg/dL
Leukocytes, UA: NEGATIVE
Nitrite: NEGATIVE
Protein, ur: NEGATIVE mg/dL
Urobilinogen, UA: 0.2 mg/dL (ref 0.0–1.0)

## 2011-10-09 LAB — SAMPLE TO BLOOD BANK

## 2011-10-09 LAB — GLUCOSE, CAPILLARY: Glucose-Capillary: 90 mg/dL (ref 70–99)

## 2011-10-09 LAB — CBC
Hemoglobin: 13.2 g/dL (ref 13.0–17.0)
Platelets: 263 10*3/uL (ref 150–400)
RBC: 4.4 MIL/uL (ref 4.22–5.81)

## 2011-10-09 LAB — DIFFERENTIAL
Basophils Relative: 0 % (ref 0–1)
Lymphs Abs: 1.5 10*3/uL (ref 0.7–4.0)
Monocytes Relative: 14 % — ABNORMAL HIGH (ref 3–12)
Neutro Abs: 3.2 10*3/uL (ref 1.7–7.7)
Neutrophils Relative %: 56 % (ref 43–77)

## 2011-10-09 LAB — TYPE AND SCREEN: Antibody Screen: NEGATIVE

## 2011-10-09 LAB — ABO/RH: ABO/RH(D): O POS

## 2011-10-09 LAB — HEMOGLOBIN AND HEMATOCRIT, BLOOD: Hemoglobin: 12 g/dL — ABNORMAL LOW (ref 13.0–17.0)

## 2011-10-09 MED ORDER — ONDANSETRON HCL 4 MG/2ML IJ SOLN: 4.0000 mg | Freq: Four times a day (QID) | INTRAMUSCULAR | Status: DC | PRN

## 2011-10-09 MED ORDER — HYDROMORPHONE HCL PF 1 MG/ML IJ SOLN
1.0000 mg | Freq: Once | INTRAMUSCULAR | Status: AC
  Administered 2011-10-09: 1 mg via INTRAVENOUS
  Filled 2011-10-09: qty 1

## 2011-10-09 MED ORDER — HYDROMORPHONE HCL PF 1 MG/ML IJ SOLN
1.0000 mg | INTRAMUSCULAR | Status: DC | PRN
  Administered 2011-10-09: 1 mg via INTRAVENOUS
  Administered 2011-10-09: 2 mg via INTRAVENOUS
  Administered 2011-10-10 – 2011-10-11 (×5): 1 mg via INTRAVENOUS
  Filled 2011-10-09: qty 1
  Filled 2011-10-09: qty 2
  Filled 2011-10-09 (×5): qty 1

## 2011-10-09 MED ORDER — FLUTICASONE PROPIONATE 50 MCG/ACT NA SUSP
2.0000 | Freq: Every day | NASAL | Status: DC
  Administered 2011-10-10: 2 via NASAL
  Filled 2011-10-09: qty 16

## 2011-10-09 MED ORDER — INSULIN ASPART 100 UNIT/ML ~~LOC~~ SOLN
0.0000 [IU] | Freq: Three times a day (TID) | SUBCUTANEOUS | Status: DC
  Administered 2011-10-11: 1 [IU] via SUBCUTANEOUS

## 2011-10-09 MED ORDER — DEXTROSE 5 % IV SOLN: 1.0000 g | INTRAVENOUS | Status: DC

## 2011-10-09 MED ORDER — ONDANSETRON HCL 4 MG PO TABS: 4.0000 mg | ORAL_TABLET | Freq: Four times a day (QID) | ORAL | Status: DC | PRN

## 2011-10-09 MED ORDER — LORAZEPAM 2 MG/ML IJ SOLN
0.5000 mg | Freq: Four times a day (QID) | INTRAMUSCULAR | Status: DC | PRN
Start: 2011-10-09 — End: 2011-10-11
  Administered 2011-10-10 – 2011-10-11 (×2): 0.5 mg via INTRAVENOUS
  Filled 2011-10-09 (×2): qty 1

## 2011-10-09 MED ORDER — IOHEXOL 300 MG/ML  SOLN
100.0000 mL | Freq: Once | INTRAMUSCULAR | Status: AC | PRN
  Administered 2011-10-09: 100 mL via INTRAVENOUS

## 2011-10-09 MED ORDER — SODIUM CHLORIDE 0.9 % IV SOLN
INTRAVENOUS | Status: AC
  Administered 2011-10-09: 17:00:00 via INTRAVENOUS

## 2011-10-09 MED ORDER — INSULIN ASPART 100 UNIT/ML ~~LOC~~ SOLN: 0.0000 [IU] | Freq: Every day | SUBCUTANEOUS | Status: DC

## 2011-10-09 MED ORDER — PANTOPRAZOLE SODIUM 40 MG IV SOLR
40.0000 mg | Freq: Two times a day (BID) | INTRAVENOUS | Status: DC
  Administered 2011-10-09 – 2011-10-10 (×2): 40 mg via INTRAVENOUS
  Filled 2011-10-09 (×3): qty 40

## 2011-10-09 MED ORDER — SODIUM CHLORIDE 0.9 % IV BOLUS (SEPSIS)
1000.0000 mL | Freq: Once | INTRAVENOUS | Status: AC
  Administered 2011-10-09: 1000 mL via INTRAVENOUS

## 2011-10-09 NOTE — H&P (Signed)
[facility] History and Physical  [name]:[id number] DOB: [birth year] DOA: 10/09/2011  Referring physician: Dr. Bruce Donath PCP: Eustaquio Boyden, MD   Chief Complaint: Rectal bleeding  HPI:  The patient is a 61 year old male with history of GERD, diabetes mellitus, hypertension who presents with above complaints. He states that he woke up this morning and some mild low abdominal discomfort and felt like he needed to have a bowel movement so he went to the bathroom and noted that all the came out was liquid and when he looked in the commode it was bright red blood. He states that has not any had any further episodes since that in the morning, but has continued to have lower abdominal cramping lasting a few minutes at the time. He denies any nausea vomiting, he also denies diarrhea. He also denies any NSAID use except for the daily aspirin and he is been on with his prior history of a stroke. He was seen in the ED and labs revealed a hemoglobin of 13.2 with a hematocrit of 38.6 and his chemistries were within normal limits. A CT scan of his abdomen and pelvis was done and showed findings consistent with diverticulosis, without any CT evidence of diverticulitis. He remained hemodynamically stable in the ED with no tachycardia.He is admitted for further evaluation and management.  Review of Systems:  The patient denies anorexia, fever, weight loss,, vision loss, decreased hearing, hoarseness, chest pain, syncope, dyspnea on exertion, peripheral edema, balance deficits, hemoptysis,, severe indigestion/heartburn, hematuria, incontinence, muscle weakness, suspicious skin lesions, transient blindness, difficulty walking, depression, unusual weight change,.   Past Medical History  Diagnosis Date  . Chest pain     normal cath 2008.  CP deemed atypical and non cardiac by cards, ?discogenic  . GERD (gastroesophageal reflux disease)   . Hyperlipidemia   . Hypertension     Essential  .  Anxiety   . Depression   . Stroke   . T2DM (type 2 diabetes mellitus)   . Esophageal stricture   . Orchitis and epididymitis, unspecified   . Back pain     with radiculopathy  . Circadian rhythm sleep disorder, irregular sleep-wake type   . Abdominal wall hernia   . Cocaine abuse     history  . Complication of anesthesia     hard to put patient to sleep   Past Surgical History  Procedure Date  . Tonsillectomy childhood  . Carpal tunnel release     Right  . Cardiac catheterization 5/04  . Trigger finger release     Right long finger  . Inguinal hernia repair     bilateral, 5 cm. RLQ Abd Lipoma Excision (Dr. Ezzard Standing)  . Esophagogastroduodenoscopy 07/17/07    Esoph. Stricture dilated (Dr. Marina Goodell)  . Anterior cervical discectomy 10/28/2008    with fusion (Dr. Shon Baton)  . Hematoma evacuation 10/29/2008    (Dr. Shon Baton)  . Direct laryngoscopy 11/02/2008    Extubation under anesthesia (Dr. Lazarus Salines)   Social History:  reports that he has never smoked. He has never used smokeless tobacco. He reports that he does not drink alcohol or use illicit drugs.  No Known Allergies  Family History  Problem Relation Age of Onset  . Diabetes Mother   . Hypertension Mother   . Hyperlipidemia Mother   . Diabetes Father   . Diabetes Sister   . Hypertension Sister   . Hypertension Brother   . Diabetes Brother   . Heart disease Maternal Uncle  Heart failure  . Cancer Paternal Uncle     Prostate  . Cancer Paternal Grandfather     Prostate  . Stroke Neg Hx   . Diabetes Brother   . Hypertension Brother   . Diabetes Brother     Prior to Admission medications   Medication Sig Start Date End Date Taking? Authorizing Provider  aspirin EC 81 MG tablet Take 81 mg by mouth daily.   Yes Historical Provider, MD  baclofen (LIORESAL) 10 MG tablet Take 10 mg by mouth daily.   Yes Historical Provider, MD  chlordiazePOXIDE (LIBRIUM) 5 MG capsule Take 1 capsule (5 mg total) by mouth 3 (three) times  daily as needed. 08/14/11  Yes Eustaquio Boyden, MD  CINNAMON PO Take 1 tablet by mouth daily.    Yes Historical Provider, MD  Cranberry 400 MG CAPS Take 1 capsule by mouth 2 (two) times daily.   Yes Historical Provider, MD  fluticasone (FLONASE) 50 MCG/ACT nasal spray Place 2 sprays into the nose daily. 06/19/11 06/18/12 Yes Eustaquio Boyden, MD  lisinopril (PRINIVIL,ZESTRIL) 10 MG tablet Take 0.5 tablets (5 mg total) by mouth daily. 08/29/11  Yes Eustaquio Boyden, MD  metFORMIN (GLUCOPHAGE) 850 MG tablet Take 1 tablet (850 mg total) by mouth 2 (two) times daily with a meal. 09/11/11 09/10/12 Yes Eustaquio Boyden, MD  metoprolol succinate (TOPROL-XL) 50 MG 24 hr tablet Take 50 mg by mouth daily. Take with or immediately following a meal.   Yes Historical Provider, MD  oxyCODONE-acetaminophen (PERCOCET) 10-325 MG per tablet Take 1 tablet by mouth every 4 (four) hours as needed. pain   Yes Historical Provider, MD  sildenafil (VIAGRA) 50 MG tablet Take 1 tablet (50 mg total) by mouth daily as needed for erectile dysfunction. 09/11/11 10/11/11 Yes Eustaquio Boyden, MD  glucose blood (ONE TOUCH TEST STRIPS) test strip Use as instructed bid, 250.02 06/21/11 06/20/12  Eustaquio Boyden, MD   Physical Exam: Filed Vitals:   10/09/11 1415 10/09/11 1445 10/09/11 1458 10/09/11 1536  BP: 119/50 130/74 131/67 124/76  Pulse: 51 63 51 50  Temp:   97.9 F (36.6 C) 98.4 F (36.9 C)  TempSrc:   Oral Oral  Resp: 19 20 20 20   Height:    5\' 8"  (1.727 m)  Weight:    107 kg (235 lb 14.3 oz)  SpO2: 100% 100% 100% 98%   Constitutional: Vital signs reviewed.  Patient is a well-developed and well-nourished in no acute distress and cooperative with exam. Alert and oriented x3.  Head: Normocephalic and atraumatic Ear: TM normal bilaterally Mouth: no erythema or exudates, MMM Eyes: PERRL, EOMI, conjunctivae normal, No scleral icterus.  Neck: Supple, Trachea midline normal ROM, No JVD, mass, thyromegaly, or carotid bruit  present.  Cardiovascular: RRR, S1 normal, S2 normal, no MRG, pulses symmetric and intact bilaterally Pulmonary/Chest: CTAB, no wheezes, rales, or rhonchi Abdominal: Soft. Non-tender, non-distended, bowel sounds are normal, no masses, organomegaly, or guarding present.  GU: no CVA tenderness Musculoskeletal: No joint deformities, erythema, or stiffness, ROM full and no nontender Hematology: no cervical, inginal, or axillary adenopathy.  Neurological: A&O x3, Strenght is normal and symmetric bilaterally, cranial nerve II-XII are grossly intact, no focal motor deficit, sensory intact to light touch bilaterally.  Skin: Warm, dry and intact. No rash, cyanosis, or clubbing.  Psychiatric: Normal mood and affect. speech and behavior is normal. Judgment and thought content normal. Cognition and memory are normal.    Labs on Admission:  Basic Metabolic Panel:  Lab 10/09/11 5784  NA 137  K 3.8  CL 101  CO2 25  GLUCOSE 144*  BUN 13  CREATININE 0.87  CALCIUM 9.3  MG --  PHOS --   Liver Function Tests:  Lab 10/09/11 0930  AST 10  ALT 17  ALKPHOS 55  BILITOT 0.2*  PROT 7.6  ALBUMIN 3.9   No results found for this basename: LIPASE:5,AMYLASE:5 in the last 168 hours No results found for this basename: AMMONIA:5 in the last 168 hours CBC:  Lab 10/09/11 0930  WBC 5.7  NEUTROABS 3.2  HGB 13.2  HCT 38.6*  MCV 87.7  PLT 263   Cardiac Enzymes: No results found for this basename: CKTOTAL:5,CKMB:5,CKMBINDEX:5,TROPONINI:5 in the last 168 hours BNP: No components found with this basename: POCBNP:5 CBG:  Lab 10/09/11 1619  GLUCAP 90    Radiological Exams on Admission: Ct Abdomen Pelvis W Contrast  10/09/2011  *RADIOLOGY REPORT*  Clinical Data: Rectal bleeding, nausea, vomiting.  CT ABDOMEN AND PELVIS WITH CONTRAST  Technique:  Multidetector CT imaging of the abdomen and pelvis was performed following the standard protocol during bolus administration of intravenous contrast.  Contrast:  OMNIPAQUE IOHEXOL 300 MG/ML  SOLN  Comparison: 10/02/2006  Findings: Limited images through the lung bases demonstrate no significant appreciable abnormality. The heart size is within normal limits. No pleural or pericardial effusion.  Subcentimeter hypodensity within the hepatic dome is similar to prior likely a hemangioma based on peripheral nodular enhancement demonstrated previously.  Unremarkable biliary system, spleen, pancreas, right adrenal gland. Left lateral limb adrenal nodularity is without interval change and too small to definitively characterize.  Symmetric renal enhancement.  No hydronephrosis or hydroureter.  No bowel obstruction.  Mild colonic diverticulosis.  No CT evidence for colitis or diverticulitis.  Appendix within normal limits.  No free intraperitoneal air or fluid.  No lymphadenopathy.  There is scattered atherosclerotic calcification of the aorta and its branches. No aneurysmal dilatation.  Anterior-abdominal wall mesh repair inferiorly.  Fat containing inguinal hernias.  Circumferential bladder wall thickening.  No acute osseous finding.  L4-5 degenerative disc disease.  IMPRESSION: Colon diverticulosis without CT evidence for diverticulitis.  Circumferential bladder wall thickening is nonspecific given incomplete distension.  Correlate with urinalysis if concerned for infection.  Original Report Authenticated By: Waneta Martins, M.D.      Assessment/Plan Active Problems: Rectal bleeding -probably diverticular, CT scan was diverticulosis and NO CT evidence of diverticulitis. -IV fluids/fluid resuscitation, type and screen, serial H&H's and transfuse as appropriate. Fredia Beets consulted [facility]-he was seen by Dr. Jarold Motto in the past. -Will keep n.p.o. pending GI evaluation and recommendations. Diabetes mellitus -hOld off oral hypoglycemics the patient n.p.o. for now -Monitor Accu-Cheks and cover with sliding scale while n.p.o.  ESSENTIAL HYPERTENSION -hOld off  antihypertensives given #1, monitor BPs and treat as appropriate.  ANXIETY DEPRESSION -Will place on when necessary Ativan, follow and resume his Librium when he's able to tolerate po. GERD -place on a PPI and follow.     Code Status: Full code   Kela Millin, MD  [facility] Pager [phone]  If 7PM-7AM, please contact night-coverage [email or web address] Password [facility] 10/09/2011, 5:31 PM

## 2011-10-09 NOTE — ED Notes (Signed)
Cardiac monitor, pulse ox placed on this pt

## 2011-10-09 NOTE — ED Notes (Signed)
Family at bedside. 

## 2011-10-09 NOTE — ED Notes (Signed)
Patient transported to CT 

## 2011-10-09 NOTE — Telephone Encounter (Signed)
Noted. Thanks.  Agree. i see he will be admitted for diverticulitis.

## 2011-10-09 NOTE — Telephone Encounter (Signed)
Caller: [name]/Patient; PCP: Eustaquio Boyden; CB#: [phone]; ; ; Call regarding Toilet Full of Blood and Clots On Toilet Paper; THE PATIENT REFUSED 911 with CSR; Patient reports that he went to the bathroom to have  stool and blood is now in the toilet and a blood clot was on the toilet paper when he wiped. Patient reports feeling dizzy currently and reports that he does feel pain at times, but not right now. Emergent s/s of "New or worsening signs and symptoms that may indicate shock" positive per Gastrointestinal Bleeding guideline. Instructed patient to hang up and dial 911. Patient reports that he will do that at this time.

## 2011-10-09 NOTE — ED Provider Notes (Signed)
History     CSN: [id number]  Arrival date & time 10/09/11  4098   First MD Initiated Contact with Patient 10/09/11 [phone]      Chief Complaint  Patient presents with  . Rectal Bleeding  . Nausea  . Emesis    (Consider location/radiation/quality/duration/timing/severity/associated sxs/prior treatment) Patient is a 61 y.o. male presenting with hematochezia and vomiting. The history is provided by the patient.  Rectal Bleeding  Associated symptoms include vomiting.  Emesis    patient here with rectal bleeding that started this morning. Describes bright red blood per rectum. Some abdominal crampy without vomiting or fever. History of colonic polyps in the past. Denies any blood in her use at this time. Nothing makes the symptoms better worse and no treatment done prior to arrival  Past Medical History  Diagnosis Date  . Chest pain     normal cath 2008.  CP deemed atypical and non cardiac by cards, ?discogenic  . GERD (gastroesophageal reflux disease)   . Hyperlipidemia   . Hypertension     Essential  . Anxiety   . Depression   . Stroke   . T2DM (type 2 diabetes mellitus)   . Esophageal stricture   . Orchitis and epididymitis, unspecified   . Back pain     with radiculopathy  . Circadian rhythm sleep disorder, irregular sleep-wake type   . Abdominal wall hernia   . Cocaine abuse     history    Past Surgical History  Procedure Date  . Tonsillectomy childhood  . Carpal tunnel release     Right  . Cardiac catheterization 5/04  . Trigger finger release     Right long finger  . Inguinal hernia repair     bilateral, 5 cm. RLQ Abd Lipoma Excision (Dr. Ezzard Standing)  . Esophagogastroduodenoscopy 07/17/07    Esoph. Stricture dilated (Dr. Marina Goodell)  . Anterior cervical discectomy 10/28/2008    with fusion (Dr. Shon Baton)  . Hematoma evacuation 10/29/2008    (Dr. Shon Baton)  . Direct laryngoscopy 11/02/2008    Extubation under anesthesia (Dr. Lazarus Salines)    Family History  Problem  Relation Age of Onset  . Diabetes Mother   . Hypertension Mother   . Hyperlipidemia Mother   . Diabetes Father   . Diabetes Sister   . Hypertension Sister   . Hypertension Brother   . Diabetes Brother   . Heart disease Maternal Uncle     Heart failure  . Cancer Paternal Uncle     Prostate  . Cancer Paternal Grandfather     Prostate  . Stroke Neg Hx   . Diabetes Brother   . Hypertension Brother   . Diabetes Brother     History  Substance Use Topics  . Smoking status: Never Smoker   . Smokeless tobacco: Not on file  . Alcohol Use: No     Occasionally      Review of Systems  Gastrointestinal: Positive for vomiting and hematochezia.  All other systems reviewed and are negative.    Allergies  Review of patient's allergies indicates no known allergies.  Home Medications   Current Outpatient Rx  Name Route Sig Dispense Refill  . ASPIRIN EC 81 MG PO TBEC Oral Take 81 mg by mouth daily.    Marland Kitchen BACLOFEN 10 MG PO TABS Oral Take 10 mg by mouth daily.    . CHLORDIAZEPOXIDE HCL 5 MG PO CAPS Oral Take 1 capsule (5 mg total) by mouth 3 (three) times daily as needed. 90 capsule  0  . CINNAMON PO Oral Take 1 tablet by mouth daily.     Marland Kitchen CRANBERRY 400 MG PO CAPS Oral Take 1 capsule by mouth 2 (two) times daily.    Marland Kitchen FLUTICASONE PROPIONATE 50 MCG/ACT NA SUSP Nasal Place 2 sprays into the nose daily. 16 g 3  . LISINOPRIL 10 MG PO TABS Oral Take 0.5 tablets (5 mg total) by mouth daily.    Marland Kitchen METFORMIN HCL 850 MG PO TABS Oral Take 1 tablet (850 mg total) by mouth 2 (two) times daily with a meal. 90 tablet 3  . METOPROLOL SUCCINATE ER 50 MG PO TB24 Oral Take 50 mg by mouth daily. Take with or immediately following a meal.    . OXYCODONE-ACETAMINOPHEN 10-325 MG PO TABS Oral Take 1 tablet by mouth every 4 (four) hours as needed. pain    . SILDENAFIL CITRATE 50 MG PO TABS Oral Take 1 tablet (50 mg total) by mouth daily as needed for erectile dysfunction. 10 tablet 0  . GLUCOSE BLOOD VI STRP   Use as instructed bid, 250.02 100 each 12    BP 122/66  Pulse 78  Temp 98.5 F (36.9 C)  Resp 20  SpO2 100%  Physical Exam  Nursing note and vitals reviewed. Constitutional: He is oriented to person, place, and time. He appears well-developed and well-nourished.  Non-toxic appearance. No distress.  HENT:  Head: Normocephalic and atraumatic.  Eyes: Conjunctivae, EOM and lids are normal. Pupils are equal, round, and reactive to light.  Neck: Normal range of motion. Neck supple. No tracheal deviation present. No mass present.  Cardiovascular: Normal rate, regular rhythm and normal heart sounds.  Exam reveals no gallop.   No murmur heard. Pulmonary/Chest: Effort normal and breath sounds normal. No stridor. No respiratory distress. He has no decreased breath sounds. He has no wheezes. He has no rhonchi. He has no rales.  Abdominal: Soft. Normal appearance and bowel sounds are normal. He exhibits no distension. There is no tenderness. There is no rebound and no CVA tenderness.  Genitourinary:       Gross Blood noted  Musculoskeletal: Normal range of motion. He exhibits no edema and no tenderness.  Neurological: He is alert and oriented to person, place, and time. He has normal strength. No cranial nerve deficit or sensory deficit. GCS eye subscore is 4. GCS verbal subscore is 5. GCS motor subscore is 6.  Skin: Skin is warm and dry. No abrasion and no rash noted.  Psychiatric: He has a normal mood and affect. His speech is normal and behavior is normal.    ED Course  Procedures (including critical care time)   Labs Reviewed  CBC  DIFFERENTIAL  COMPREHENSIVE METABOLIC PANEL  PROTIME-INR  APTT  SAMPLE TO BLOOD BANK   No results found.   No diagnosis found.    MDM  Pt given iv fluids and pain meds, will be treated inpt for diverticulosis        Toy Baker, MD 10/09/11 1330

## 2011-10-09 NOTE — ED Notes (Signed)
MD at bedside. EDP Allen 

## 2011-10-09 NOTE — ED Notes (Signed)
WUJ:[id number]<JY> Expected date:10/09/11<BR> Expected time: 8:55 AM<BR> Means of arrival:Ambulance<BR> Comments:<BR> 60yoF, rectal bleed/abd pain

## 2011-10-09 NOTE — ED Notes (Signed)
Pt presents with no acute distress.  Pt c/o of N/V starting yesterday- rectal bleeding bright red started this am.  Pt reports "funeral of father today"

## 2011-10-09 NOTE — ED Notes (Signed)
Patient is resting comfortably. 

## 2011-10-10 ENCOUNTER — Encounter (HOSPITAL_COMMUNITY): Payer: Self-pay | Admitting: Physician Assistant

## 2011-10-10 DIAGNOSIS — K5730 Diverticulosis of large intestine without perforation or abscess without bleeding: Secondary | ICD-10-CM

## 2011-10-10 DIAGNOSIS — R1310 Dysphagia, unspecified: Secondary | ICD-10-CM | POA: Diagnosis present

## 2011-10-10 DIAGNOSIS — K625 Hemorrhage of anus and rectum: Secondary | ICD-10-CM

## 2011-10-10 DIAGNOSIS — R1314 Dysphagia, pharyngoesophageal phase: Secondary | ICD-10-CM

## 2011-10-10 DIAGNOSIS — K921 Melena: Secondary | ICD-10-CM

## 2011-10-10 DIAGNOSIS — I10 Essential (primary) hypertension: Secondary | ICD-10-CM

## 2011-10-10 DIAGNOSIS — K648 Other hemorrhoids: Secondary | ICD-10-CM

## 2011-10-10 DIAGNOSIS — E1165 Type 2 diabetes mellitus with hyperglycemia: Secondary | ICD-10-CM

## 2011-10-10 LAB — BASIC METABOLIC PANEL
BUN: 9 mg/dL (ref 6–23)
Chloride: 106 mEq/L (ref 96–112)
GFR calc non Af Amer: 88 mL/min — ABNORMAL LOW (ref >90)
Glucose, Bld: 112 mg/dL — ABNORMAL HIGH (ref 70–99)
Potassium: 4 mEq/L (ref 3.5–5.1)
Sodium: 140 mEq/L (ref 135–145)

## 2011-10-10 LAB — TROPONIN I: Troponin I: <0.3 ng/mL (ref <0.30)

## 2011-10-10 LAB — HEMOGLOBIN AND HEMATOCRIT, BLOOD: HCT: 35.4 % — ABNORMAL LOW (ref 39.0–52.0)

## 2011-10-10 LAB — GLUCOSE, CAPILLARY: Glucose-Capillary: 86 mg/dL (ref 70–99)

## 2011-10-10 MED ORDER — BISACODYL 5 MG PO TBEC
20.0000 mg | DELAYED_RELEASE_TABLET | Freq: Once | ORAL | Status: AC
  Administered 2011-10-10: 20 mg via ORAL
  Filled 2011-10-10: qty 1
  Filled 2011-10-10: qty 3

## 2011-10-10 MED ORDER — METOCLOPRAMIDE HCL 5 MG/ML IJ SOLN
10.0000 mg | Freq: Once | INTRAMUSCULAR | Status: AC
  Administered 2011-10-10: 10 mg via INTRAVENOUS
  Filled 2011-10-10: qty 2

## 2011-10-10 MED ORDER — NITROGLYCERIN 0.4 MG SL SUBL
SUBLINGUAL_TABLET | SUBLINGUAL | Status: AC
  Administered 2011-10-10: 0.5 mg
  Filled 2011-10-10: qty 25

## 2011-10-10 MED ORDER — PEG 3350-KCL-NA BICARB-NACL 420 G PO SOLR
2000.0000 mL | Freq: Once | ORAL | Status: AC
  Administered 2011-10-10: 2000 mL via ORAL

## 2011-10-10 MED ORDER — SODIUM CHLORIDE 0.45 % IV SOLN: Freq: Once | INTRAVENOUS | Status: DC

## 2011-10-10 MED ORDER — ALUM & MAG HYDROXIDE-SIMETH 200-200-20 MG/5ML PO SUSP
30.0000 mL | Freq: Once | ORAL | Status: AC
  Administered 2011-10-10: 30 mL via ORAL
  Filled 2011-10-10: qty 30

## 2011-10-10 MED ORDER — PANTOPRAZOLE SODIUM 40 MG PO TBEC
40.0000 mg | DELAYED_RELEASE_TABLET | Freq: Every day | ORAL | Status: DC
  Administered 2011-10-11: 40 mg via ORAL
  Filled 2011-10-10 (×2): qty 1

## 2011-10-10 NOTE — Progress Notes (Signed)
[facility]                                                                                Patient Demographics  [name], is a 61 y.o. male  ZOX:[id number]  WJX:[id number]  DOB - 1950-10-07  Admit date - 10/09/2011  Admitting Physician Kela Millin, MD  Outpatient Primary MD for the patient is Eustaquio Boyden, MD  LOS - 1   Chief Complaint  Patient presents with  . Rectal Bleeding  . Nausea  . Emesis        Subjective:   [name] today has, No headache, No chest pain, No abdominal pain - No Nausea, No new weakness tingling or numbness, No Cough - SOB.   Objective:   Filed Vitals:   10/09/11 1536 10/09/11 2153 10/10/11 0535 10/10/11 1259  BP: 124/76 122/77 120/76 143/87  Pulse: 50 51 58 61  Temp: 98.4 F (36.9 C) 98.7 F (37.1 C) 97.9 F (36.6 C) 98.1 F (36.7 C)  TempSrc: Oral Oral Oral Oral  Resp: 20 18 16 18   Height: 5\' 8"  (1.727 m)     Weight: 107 kg (235 lb 14.3 oz)     SpO2: 98% 98% 99% 100%    Wt Readings from Last 3 Encounters:  10/09/11 107 kg (235 lb 14.3 oz)  09/11/11 108.75 kg (239 lb 12 oz)  08/29/11 108.41 kg (239 lb)     Intake/Output Summary (Last 24 hours) at 10/10/11 1607 Last data filed at 10/10/11 1500  Gross per 24 hour  Intake 1083.33 ml  Output   2550 ml  Net -1466.67 ml    Exam Awake Alert, Oriented *3, No new F.N deficits, Normal affect [place].AT,PERRAL Supple Neck,No JVD, No cervical lymphadenopathy appriciated.  Symmetrical Chest wall movement, Good air movement bilaterally, CTAB RRR,No Gallops,Rubs or new Murmurs, No Parasternal Heave +ve B.Sounds, Abd Soft, Non tender, No organomegaly appriciated, No rebound -guarding or rigidity. No Cyanosis, Clubbing or edema, No new Rash or bruise     Data Review  CBC  Lab 10/10/11 0525 10/09/11 2340 10/09/11 1838 10/09/11 0930  WBC -- -- -- 5.7  HGB 12.7* 12.0* 12.0* 13.2  HCT 37.6* 35.4* 35.0* 38.6*  PLT -- -- -- 263  MCV -- -- -- 87.7    MCH -- -- -- 30.0  MCHC -- -- -- 34.2  RDW -- -- -- 14.5  LYMPHSABS -- -- -- 1.5  MONOABS -- -- -- 0.8  EOSABS -- -- -- 0.2  BASOSABS -- -- -- 0.0  BANDABS -- -- -- --    Chemistries   Lab 10/10/11 0525 10/09/11 0930  NA 140 137  K 4.0 3.8  CL 106 101  CO2 25 25  GLUCOSE 112* 144*  BUN 9 13  CREATININE 0.95 0.87  CALCIUM 8.7 9.3  MG -- --  AST -- 10  ALT -- 17  ALKPHOS -- 55  BILITOT -- 0.2*   ------------------------------------------------------------------------------------------------------------------ estimated creatinine clearance is 96.8 ml/min (by C-G formula based on Cr of 0.95). ------------------------------------------------------------------------------------------------------------------ No results found for this basename: HGBA1C:2 in the last 72 hours ------------------------------------------------------------------------------------------------------------------ No results found for this basename: CHOL:2,HDL:2,LDLCALC:2,TRIG:2,CHOLHDL:2,LDLDIRECT:2 in the last 72  hours ------------------------------------------------------------------------------------------------------------------ No results found for this basename: TSH,T4TOTAL,FREET3,T3FREE,THYROIDAB in the last 72 hours ------------------------------------------------------------------------------------------------------------------ No results found for this basename: VITAMINB12:2,FOLATE:2,FERRITIN:2,TIBC:2,IRON:2,RETICCTPCT:2 in the last 72 hours  Coagulation profile  Lab 10/09/11 0930  INR 0.96  PROTIME --    No results found for this basename: DDIMER:2 in the last 72 hours  Cardiac Enzymes No results found for this basename: CK:3,CKMB:3,TROPONINI:3,MYOGLOBIN:3 in the last 168 hours ------------------------------------------------------------------------------------------------------------------ No components found with this basename: POCBNP:3  Micro Results No results found for this or any  previous visit (from the past 240 hour(s)).  Radiology Reports Ct Abdomen Pelvis W Contrast  10/09/2011  *RADIOLOGY REPORT*  Clinical Data: Rectal bleeding, nausea, vomiting.  CT ABDOMEN AND PELVIS WITH CONTRAST  Technique:  Multidetector CT imaging of the abdomen and pelvis was performed following the standard protocol during bolus administration of intravenous contrast.  Contrast: OMNIPAQUE IOHEXOL 300 MG/ML  SOLN  Comparison: 10/02/2006  Findings: Limited images through the lung bases demonstrate no significant appreciable abnormality. The heart size is within normal limits. No pleural or pericardial effusion.  Subcentimeter hypodensity within the hepatic dome is similar to prior likely a hemangioma based on peripheral nodular enhancement demonstrated previously.  Unremarkable biliary system, spleen, pancreas, right adrenal gland. Left lateral limb adrenal nodularity is without interval change and too small to definitively characterize.  Symmetric renal enhancement.  No hydronephrosis or hydroureter.  No bowel obstruction.  Mild colonic diverticulosis.  No CT evidence for colitis or diverticulitis.  Appendix within normal limits.  No free intraperitoneal air or fluid.  No lymphadenopathy.  There is scattered atherosclerotic calcification of the aorta and its branches. No aneurysmal dilatation.  Anterior-abdominal wall mesh repair inferiorly.  Fat containing inguinal hernias.  Circumferential bladder wall thickening.  No acute osseous finding.  L4-5 degenerative disc disease.  IMPRESSION: Colon diverticulosis without CT evidence for diverticulitis.  Circumferential bladder wall thickening is nonspecific given incomplete distension.  Correlate with urinalysis if concerned for infection.  Original Report Authenticated By: Waneta Martins, M.D.    Scheduled Meds:   . sodium chloride   Intravenous Once  . sodium chloride   Intravenous STAT  . bisacodyl  20 mg Oral Once  . fluticasone  2 spray Each  Nare Daily  . insulin aspart  0-5 Units Subcutaneous QHS  . insulin aspart  0-9 Units Subcutaneous TID WC  . metoCLOPramide (REGLAN) injection  10 mg Intravenous Once  . metoCLOPramide (REGLAN) injection  10 mg Intravenous Once  . pantoprazole  40 mg Oral Q0600  . polyethylene glycol-electrolytes  2,000 mL Oral Once  . polyethylene glycol-electrolytes  2,000 mL Oral Once  . DISCONTD: pantoprazole (PROTONIX) IV  40 mg Intravenous Q12H   Continuous Infusions:  PRN Meds:.HYDROmorphone (DILAUDID) injection, LORazepam, ondansetron (ZOFRAN) IV, ondansetron  Assessment & Plan   Rectal bleeding   -probably diverticular, CT scan shows diverticulosis and NO CT evidence of diverticulitis. No further bleed, H&H remained stable, GI seeing the patient, scheduled for EGD and colonoscopy in the morning. For now we'll monitor H&H, transfuse if needed goal hemoglobin above 8.Po PPI continue.   .  Diabetes mellitus   -hold off oral hypoglycemics the patient n.p.o. for now  -Monitor Accu-Cheks and cover with sliding scale patient will be n.p.o. after midnight.  CBG (last 3)   Basename 10/09/11 1619  GLUCAP 90      ESSENTIAL HYPERTENSION  -stable, monitor.   ANXIETY DEPRESSION  -Will place on when necessary Ativan, follow and resume his Librium when he's  able to tolerate po.    GERD  -placed on a PPI and follow.   DVT Prophylaxis    SCDs        Leroy Sea M.D on 10/10/2011 at 4:07 PM  Between 7am to 7pm - Pager - [phone]  After 7pm go to [email or web address] - password [email or web address]  And look for the night coverage person covering for me after hours  [facility] Office  [phone]

## 2011-10-10 NOTE — Progress Notes (Signed)
Pt complains of chest pain in the center of his upper chest. Pt states it is a 7/10, does not radiate anywhere and he is not SOB. Vital signs stable, EKG completed, Nitro x1 given. MD paged, orders received. Will continue to monitor. Teeghan Hammer A

## 2011-10-10 NOTE — Consult Note (Signed)
[facility] Consult: 10:57 AM 10/10/2011   Referring Provider: Sharon Seller, MD  Primary Care Physician:  Eustaquio Boyden, MD Primary Gastroenterologist:  Dr. Sheryn Bison Pain mgt MD:  Dr Ethelene Hal at [facility] orthopedics.   Reason for Consultation:  Hematochezia.   HPI: [name] is a 61 y.o. male. Hx IBS and drug-seeking behavior.  Chronic gastritis, H Pylori positive. Hx esoph stricture.  Abd ultrasound in 10/2009 negative for pathology to explain chronic nausea, lower abd pain radiating to legs.  Colonoscopy in 2011,  EGDs in 2009 and 2011 are described below.    Per staff correspondence of 04/08/2012, Dr Jarold Motto said "He has functional bowel problems and a prior history of drug seeking. He has functional dyspepsia and does not need further GI workup. I would refer him back to his primary care physician."  Developed minor discomfort in lower belly, urge to defecate yesterday AM, 6/18.  Proceeded to pass red blood, volume filled commode.  Some diaphoresis  Recurrent abdominal cramping ensued during the day, but no recurrent bleeding, just the one episode.  This pain is consistent with frequent bouts he has had in lower abdomen for years.  It is not precipitated by food.  It is inconsistently relieved with defecation.  He has no N/V.  Endorses reflux that he rides out.  No PPI at home.  Solid dysphagia in region of GEJ.  Ortho MD has told pt this was from the neck surgery.  Hgb 13.2 in ED last night.  Dropped to 12.0 but latest is 12.7 and this is after getting 2.4 liters of IVF over 12 hours.  Coags are normal.  BMET is normal. Has been hemodynamically stable.  No c/o dizzyness.  No signs of colitis or diverticulitis, but does have diverticulosis on the abd/pelvic CT scan of 10/09/11.  This concurs with tics found on colonoscopy 07/2009. Daily 81 mg ASA, denies other NSAIDs.  Uses Percocet 5 times daily.  Prn Miralax for constipation.  Has 1 to 3  spontaneous BMs daily for the most part.  Does not see rectal bleeding generally and reports no recent straining to pass stools.    Past Medical History  Diagnosis Date  . Chest pain     normal cath 2008.  CP deemed atypical and non cardiac by cards, ?discogenic  . GERD (gastroesophageal reflux disease)   . Hyperlipidemia   . Hypertension     Essential  . Anxiety   . Depression   . Stroke   . T2DM (type 2 diabetes mellitus)   . Esophageal stricture   . Orchitis and epididymitis, unspecified   . Back pain     with radiculopathy  . Circadian rhythm sleep disorder, irregular sleep-wake type   . Abdominal wall hernia   . Cocaine abuse     history  . Complication of anesthesia     hard to put patient to sleep    Past Surgical History  Procedure Date  . Tonsillectomy childhood  . Carpal tunnel release     Right  . Cardiac catheterization 5/04  . Trigger finger release     Right long finger  . Inguinal hernia repair     bilateral, 5 cm. RLQ Abd Lipoma Excision (Dr. Ezzard Standing)  . Esophagogastroduodenoscopy 07/17/07    Esoph. Stricture dilated (Dr. Marina Goodell)  . Anterior cervical discectomy 10/28/2008    with fusion (Dr. Shon Baton)  . Hematoma evacuation 10/29/2008    (Dr. Shon Baton)  . Direct laryngoscopy 11/02/2008    Extubation under anesthesia (  Dr. Lazarus Salines)    Prior to Admission medications   Medication Sig Start Date End Date Taking? Authorizing Provider  aspirin EC 81 MG tablet Take 81 mg by mouth daily.   Yes Historical Provider, MD  baclofen (LIORESAL) 10 MG tablet Take 10 mg by mouth daily.   Yes Historical Provider, MD  chlordiazePOXIDE (LIBRIUM) 5 MG capsule Take 1 capsule (5 mg total) by mouth 3 (three) times daily as needed. 08/14/11  Yes Eustaquio Boyden, MD  CINNAMON PO Take 1 tablet by mouth daily.    Yes Historical Provider, MD  Cranberry 400 MG CAPS Take 1 capsule by mouth 2 (two) times daily.   Yes Historical Provider, MD  fluticasone (FLONASE) 50 MCG/ACT nasal spray  Place 2 sprays into the nose daily. 06/19/11 06/18/12 Yes Eustaquio Boyden, MD  lisinopril (PRINIVIL,ZESTRIL) 10 MG tablet Take 0.5 tablets (5 mg total) by mouth daily. 08/29/11  Yes Eustaquio Boyden, MD  metFORMIN (GLUCOPHAGE) 850 MG tablet Take 1 tablet (850 mg total) by mouth 2 (two) times daily with a meal. 09/11/11 09/10/12 Yes Eustaquio Boyden, MD  metoprolol succinate (TOPROL-XL) 50 MG 24 hr tablet Take 50 mg by mouth daily. Take with or immediately following a meal.   Yes Historical Provider, MD  oxyCODONE-acetaminophen (PERCOCET) 10-325 MG per tablet Take 1 tablet by mouth every 4 (four) hours as needed. pain   Yes Historical Provider, MD  sildenafil (VIAGRA) 50 MG tablet Take 1 tablet (50 mg total) by mouth daily as needed for erectile dysfunction. 09/11/11 10/11/11 Yes Eustaquio Boyden, MD  glucose blood (ONE TOUCH TEST STRIPS) test strip Use as instructed bid, 250.02 06/21/11 06/20/12  Eustaquio Boyden, MD    Scheduled Meds:    . sodium chloride   Intravenous STAT  . fluticasone  2 spray Each Nare Daily  . HYDROmorphone  1 mg Intravenous Once  . insulin aspart  0-5 Units Subcutaneous QHS  . insulin aspart  0-9 Units Subcutaneous TID WC  . pantoprazole (PROTONIX) IV  40 mg Intravenous Q12H  . DISCONTD: cefTRIAXone (ROCEPHIN)  IV  1 g Intravenous Q24H   Infusions:   PRN Meds: HYDROmorphone (DILAUDID) injection, iohexol, LORazepam, ondansetron (ZOFRAN) IV, ondansetron   Allergies as of 10/09/2011  . (No Known Allergies)    Family History  Problem Relation Age of Onset  . Diabetes Mother   . Hypertension Mother   . Hyperlipidemia Mother   . Diabetes Father   . Diabetes Sister   . Hypertension Sister   . Hypertension Brother   . Diabetes Brother   . Heart disease Maternal Uncle     Heart failure  . Cancer Paternal Uncle     Prostate  . Cancer Paternal Grandfather     Prostate  . Stroke Neg Hx   . Diabetes Brother   . Hypertension Brother   . Diabetes Brother      History   Social History  . Marital Status: Single    Spouse Name: N/A    Number of Children: N/A  . Years of Education: N/A   Occupational History  . [name] previously     Now on disability after neck surgery   Social History Main Topics  . Smoking status: Never Smoker   . Smokeless tobacco: Never Used  . Alcohol Use: No     Occasionally  . Drug Use: No     H/O marijuana (last 2005); Cocaine 5/04  . Sexually Active: No   Other Topics Concern  . 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    REVIEW OF SYSTEMS: Constitutional:  No weight loss.  Does leg weight lifting exercise ENT:  No nose bleeds Pulm:  SOB with exertion, stable.  No cough.  No PND CV:  No palps or chest pain.  No pedal edema GU:  No dysuria, hematuria or frequency. GI:  Solid dysphagia in region of GE junction, intermittent. Heme:  No unusual bruising or bleeding.    Transfusions:  None in past Neuro:  No headache, dizzyness, balance problems, falls MS:  Chronic right shoulder and neck pain.  On disability for this.  No numbness or tingling Derm:  No rash, itching, sores Endocrine:  Sugars run 80s to 120's at home.  No polydisia/polyuria Immunization:  Up to date flu, Tdap, zoster.  Travel:  None out of state.    PHYSICAL EXAM: Vital signs in last 24 hours: Temp:  [97.9 F (36.6 C)-98.7 F (37.1 C)] 97.9 F (36.6 C) (06/19 0535) Pulse Rate:  [50-97] 58  (06/19 0535) Resp:  [15-20] 16  (06/19 0535) BP: (119-151)/(50-97) 120/76 mmHg (06/19 0535) SpO2:  [98 %-100 %] 99 % (06/19 0535) Weight:  [235 lb 14.3 oz (107 kg)] 235 lb 14.3 oz (107 kg) (06/18 1536)  General: pleasant, robust appearing AAM.  Looks well.  Head:  No assymetry   Eyes:  No icterus or pallor Ears:  Not HOH  Nose:  No discharge or  bleeding Mouth:  Clear, moist MM.  Dentition good Neck:  No mass or JVD, ortho surgery scars visible Lungs:  No rales, ronchi, cough, SOB. Heart: RRR. S1 S2 audible.  No MRG Abdomen:  Soft, active BS.  No mass or HSM.  Not tender.  No distention.   Rectal: no visible blood or masses on visual inspection.  Did not do digital exam.   Musc/Skeltl: no gross joint deformity or swelling.  Healed arthroscopic scars right shoulder. Extremities:  No pedal edema.  Feet warm with 3 plus B pedal pulses  Neurologic:  No tremor, moves all 4s.  A & O x 3.   Skin:  No rash, sores Tattoos:  none Nodes:  No adenopathy at neck or groin.    Psych:  Pleasant, no agitation.  Affect non-depressed.   Intake/Output from previous day: 06/18 0701 - 06/19 0700 In: 5083.3 [P.O.:2120; I.V.:2953.3; IV Piggyback:10] Out: 3325 [Urine:3325] Intake/Output this shift: Total I/O In: -  Out: 625 [Urine:625]  LAB RESULTS:   Ref. Range 08/27/2011 14:58 10/09/2011 09:30 10/09/2011 18:38 10/09/2011 23:40 10/10/2011 05:25  WBC Latest Range: 4.0-10.5 K/uL 9.1 5.7     RBC Latest Range: 4.22-5.81 MIL/uL 4.28 4.40     Hemoglobin Latest Range: 13.0-17.0 g/dL 16.1 09.6 04.5 (L) 40.9 (L) 12.7 (L)  HCT Latest Range: 39.0-52.0 % 39.0 38.6 (L) 35.0 (L) 35.4 (L) 37.6 (L)  MCV Latest Range: 78.0-100.0 fL 91.2 87.7     MCH Latest Range: 26.0-34.0 pg  30.0     MCHC Latest Range: 30.0-36.0 g/dL 81.1 91.4     RDW Latest Range: 11.5-15.5 % 15.0 (H) 14.5     Platelets Latest Range: 150-400 K/uL 264.0 263        BMET Lab Results  Component Value Date   NA 140 10/10/2011   NA 137 10/09/2011   NA 138 08/27/2011   K  4.0 10/10/2011   K 3.8 10/09/2011   K 4.0 08/27/2011   CL 106 10/10/2011   CL 101 10/09/2011   CL 99 08/27/2011   CO2 25 10/10/2011   CO2 25 10/09/2011   CO2 28 08/27/2011   GLUCOSE 112* 10/10/2011   GLUCOSE 144* 10/09/2011   GLUCOSE 109* 08/27/2011   BUN 9 10/10/2011   BUN 13 10/09/2011   BUN 12 08/27/2011   CREATININE 0.95 10/10/2011    CREATININE 0.87 10/09/2011   CREATININE 1.0 08/27/2011   CALCIUM 8.7 10/10/2011   CALCIUM 9.3 10/09/2011   CALCIUM 8.7 08/27/2011   LFT  Basename 10/09/11 0930  PROT 7.6  ALBUMIN 3.9  AST 10  ALT 17  ALKPHOS 55  BILITOT 0.2*  BILIDIR --  IBILI --   PT/INR Lab Results  Component Value Date   INR 0.96 10/09/2011   INR 1.0 RATIO 12/26/2007   Hepatitis Panel No results found for this basename: HEPBSAG,HCVAB,HEPAIGM,HEPBIGM in the last 72 hours C-Diff No components found with this basename: cdiff    Drugs of Abuse     Component Value Date/Time   LABOPIA NONE DETECTED 11/22/2006 0716   COCAINSCRNUR NONE DETECTED 11/22/2006 0716   LABBENZ NONE DETECTED 11/22/2006 0716   AMPHETMU NONE DETECTED 11/22/2006 0716   THCU NONE DETECTED 11/22/2006 0716   LABBARB  Value: NONE DETECTED        DRUG SCREEN FOR MEDICAL PURPOSES ONLY.  IF CONFIRMATION IS NEEDED FOR ANY PURPOSE, NOTIFY LAB WITHIN 5 DAYS. 11/22/2006 0716     RADIOLOGY STUDIES: Ct Abdomen Pelvis W Contrast  10/09/2011  *RADIOLOGY REPORT*  Clinical Data: Rectal bleeding, nausea, vomiting.  CT ABDOMEN AND PELVIS WITH CONTRAST  Technique:  Multidetector CT imaging of the abdomen and pelvis was performed following the standard protocol during bolus administration of intravenous contrast.  Contrast: OMNIPAQUE IOHEXOL 300 MG/ML  SOLN  Comparison: 10/02/2006  Findings: Limited images through the lung bases demonstrate no significant appreciable abnormality. The heart size is within normal limits. No pleural or pericardial effusion.  Subcentimeter hypodensity within the hepatic dome is similar to prior likely a hemangioma based on peripheral nodular enhancement demonstrated previously.  Unremarkable biliary system, spleen, pancreas, right adrenal gland. Left lateral limb adrenal nodularity is without interval change and too small to definitively characterize.  Symmetric renal enhancement.  No hydronephrosis or hydroureter.  No bowel obstruction.  Mild  colonic diverticulosis.  No CT evidence for colitis or diverticulitis.  Appendix within normal limits.  No free intraperitoneal air or fluid.  No lymphadenopathy.  There is scattered atherosclerotic calcification of the aorta and its branches. No aneurysmal dilatation.  Anterior-abdominal wall mesh repair inferiorly.  Fat containing inguinal hernias.  Circumferential bladder wall thickening.  No acute osseous finding.  L4-5 degenerative disc disease.  IMPRESSION: Colon diverticulosis without CT evidence for diverticulitis.  Circumferential bladder wall thickening is nonspecific given incomplete distension.  Correlate with urinalysis if concerned for infection.  Original Report Authenticated By: Waneta Martins, M.D.    ENDOSCOPIC STUDIES: 10/2009   EGD for abdominal pain despite treatment.  ENDOSCOPIC IMPRESSION:  1) Irregular Z-line  2) Normal duodenum  3) Normal stomach  4) Normal esophagus  1.chronic abdominal pain ?? etiology.PROBABLE FUNCTIONAL  DYSPEPSIA.RECENT LABS AND ULTRASOUND EXAM NORMAL.  2.R/O BARRETT'S MUCOSA,R/O H.PYLORI.  RECOMMENDATIONS:  1) Await biopsy results  2) Rx CLO if positive  3) continue PPI  TRIAL OF TID LIBRAX AC.#100, REFILL X3.I SEE NO INDICATION FOR  NARCOTIC USE,MAY NEED PAIN  CLINIC REFERRAL.  07/2009  Colonoscopy, screening of average risk pt.  COMPLICATIONS: None  ENDOSCOPIC IMPRESSION:  1) Diverticula, scattered in the sigmoid colon  2) No polyps or cancers  VERY POOR PREP.CHRONIC CONSTIPATION PROBLEMS.  RECOMMENDATIONS:  1) High fiber diet with liberal fluid intake.  2) continue current medications  3) Follow up colonoscopy in 5 years  TRIAL OF [name] 20 GMS QHS.  REPEAT EXAM: No  06/2007   EGD with [facility] Dilatation of esophageal stricture, Dr Marina Goodell.     IMPRESSION: *  Hematochezia associated with his usual, chronic lower abdominal discomfort.  Presume this is diverticular.  He does not need a colonoscopy. *  IBS with Chronic abd pain,  no change *  Narcotic requiring right shoulder and neck pain.  Shoulder surgery in Jan 2013, previous c spine surgery. *  Type 2 DM.  Non insulin requiring, control appears to be good *  Dysphagia to solids.  Do not think this is sequelae of c spine surgery.  Not taking PPI or H2 blockers at home.  2009 EGD with dilation of esoph stricture.    PLAN: *  Allow clears, but likely could have solids soon. *  Change from IV to po Protonix.  He ought to be taking daily PPI given history of esoph stricture requiring dilatation in 2009 and c/o GE Jx location of dysphagia to solids.  *  CBC in AM.   LOS: 1 day   Jennye Moccasin  10/10/2011, 10:57 AM Pager: [phone]       GI Attending  I have also seen and assessed the patient and agree with the above note with following additions.  His colonoscopy in 2011 had very poor prep so I recommended to him that we repeat a colonoscopy as missed lesions possible.  He should also have an EGD and dilation for what sounds like esophageal dysphagia - he has responded to [facility] dilation in past.  The risks, benefits, and alternatives to endoscopy with possible biopsy and possible dilation were discussed with the patient and they consent to proceed.      Iva Boop, MD, [facility] [phone] (pager) 10/10/2011 1:29 PM

## 2011-10-11 ENCOUNTER — Encounter (HOSPITAL_COMMUNITY): Payer: Self-pay | Admitting: Gastroenterology

## 2011-10-11 ENCOUNTER — Telehealth: Payer: Self-pay | Admitting: Family Medicine

## 2011-10-11 ENCOUNTER — Encounter (HOSPITAL_COMMUNITY)
Admission: EM | Disposition: A | Discharge status: Home / Self Care | Payer: Self-pay | Source: Home / Self Care | Attending: Internal Medicine

## 2011-10-11 DIAGNOSIS — K625 Hemorrhage of anus and rectum: Secondary | ICD-10-CM

## 2011-10-11 DIAGNOSIS — I10 Essential (primary) hypertension: Secondary | ICD-10-CM

## 2011-10-11 DIAGNOSIS — E1165 Type 2 diabetes mellitus with hyperglycemia: Secondary | ICD-10-CM

## 2011-10-11 DIAGNOSIS — K648 Other hemorrhoids: Secondary | ICD-10-CM | POA: Diagnosis present

## 2011-10-11 HISTORY — PX: COLONOSCOPY: SHX5424

## 2011-10-11 HISTORY — PX: ESOPHAGOGASTRODUODENOSCOPY: SHX5428

## 2011-10-11 LAB — URINE CULTURE
Colony Count: 6000
Culture  Setup Time: 201306190123

## 2011-10-11 LAB — CBC
MCV: 87.9 fL (ref 78.0–100.0)
Platelets: 243 10*3/uL (ref 150–400)
RBC: 4.2 MIL/uL — ABNORMAL LOW (ref 4.22–5.81)
WBC: 7.4 10*3/uL (ref 4.0–10.5)

## 2011-10-11 LAB — GLUCOSE, CAPILLARY
Glucose-Capillary: 77 mg/dL (ref 70–99)
Glucose-Capillary: 136 mg/dL — ABNORMAL HIGH (ref 70–99)
Glucose-Capillary: 115 mg/dL — ABNORMAL HIGH (ref 70–99)

## 2011-10-11 LAB — TROPONIN I
Troponin I: <0.3 ng/mL (ref <0.30)
Troponin I: <0.3 ng/mL (ref <0.30)

## 2011-10-11 SURGERY — COLONOSCOPY
Anesthesia: Moderate Sedation

## 2011-10-11 MED ORDER — BUTAMBEN-TETRACAINE-BENZOCAINE 2-2-14 % EX AERO
INHALATION_SPRAY | CUTANEOUS | Status: DC | PRN
  Administered 2011-10-11: 2 via TOPICAL

## 2011-10-11 MED ORDER — HYDROCODONE-ACETAMINOPHEN 5-325 MG PO TABS: 1.0000 | ORAL_TABLET | Freq: Four times a day (QID) | ORAL | Status: AC | PRN

## 2011-10-11 MED ORDER — ASPIRIN EC 81 MG PO TBEC: 81.0000 mg | DELAYED_RELEASE_TABLET | Freq: Every day | ORAL | Status: DC

## 2011-10-11 MED ORDER — MIDAZOLAM HCL 10 MG/2ML IJ SOLN
INTRAMUSCULAR | Status: DC | PRN
  Administered 2011-10-11: 1 mg via INTRAVENOUS
  Administered 2011-10-11 (×4): 2 mg via INTRAVENOUS

## 2011-10-11 MED ORDER — HYDROCORTISONE ACETATE 25 MG RE SUPP: 25.0000 mg | Freq: Two times a day (BID) | RECTAL | Status: AC

## 2011-10-11 MED ORDER — METOPROLOL TARTRATE 50 MG PO TABS: 25.0000 mg | ORAL_TABLET | Freq: Two times a day (BID) | ORAL | Status: DC

## 2011-10-11 MED ORDER — BACLOFEN 10 MG PO TABS: 10.0000 mg | ORAL_TABLET | Freq: Every evening | ORAL | Status: DC | PRN

## 2011-10-11 MED ORDER — PANTOPRAZOLE SODIUM 40 MG PO TBEC: 40.0000 mg | DELAYED_RELEASE_TABLET | Freq: Every day | ORAL | Status: DC

## 2011-10-11 MED ORDER — HYDROCODONE-ACETAMINOPHEN 5-325 MG PO TABS
1.0000 | ORAL_TABLET | Freq: Four times a day (QID) | ORAL | Status: DC | PRN
  Administered 2011-10-11: 1 via ORAL
  Filled 2011-10-11: qty 1

## 2011-10-11 MED ORDER — DIPHENHYDRAMINE HCL 50 MG/ML IJ SOLN
INTRAMUSCULAR | Status: DC | PRN
  Administered 2011-10-11: 12.5 mg via INTRAVENOUS

## 2011-10-11 MED ORDER — DIPHENHYDRAMINE HCL 50 MG/ML IJ SOLN
INTRAMUSCULAR | Status: AC
  Filled 2011-10-11: qty 1

## 2011-10-11 MED ORDER — HYDROCORTISONE ACETATE 25 MG RE SUPP
25.0000 mg | Freq: Every day | RECTAL | Status: DC
  Filled 2011-10-11: qty 1

## 2011-10-11 MED ORDER — FENTANYL CITRATE 0.05 MG/ML IJ SOLN
INTRAMUSCULAR | Status: AC
  Filled 2011-10-11: qty 4

## 2011-10-11 MED ORDER — CHLORDIAZEPOXIDE HCL 5 MG PO CAPS: 5.0000 mg | ORAL_CAPSULE | Freq: Every day | ORAL | Status: DC

## 2011-10-11 MED ORDER — HYDROCORTISONE ACETATE 25 MG RE SUPP
25.0000 mg | Freq: Two times a day (BID) | RECTAL | Status: DC
  Filled 2011-10-11 (×2): qty 1

## 2011-10-11 MED ORDER — MIDAZOLAM HCL 10 MG/2ML IJ SOLN
INTRAMUSCULAR | Status: AC
  Filled 2011-10-11: qty 4

## 2011-10-11 MED ORDER — FENTANYL NICU IV SYRINGE 50 MCG/ML
INJECTION | INTRAMUSCULAR | Status: DC | PRN
  Administered 2011-10-11 (×3): 25 ug via INTRAVENOUS

## 2011-10-11 MED ORDER — SODIUM CHLORIDE 0.9 % IV SOLN
INTRAVENOUS | Status: DC
  Administered 2011-10-11: 500 mL via INTRAVENOUS

## 2011-10-11 NOTE — Discharge Summary (Signed)
[facility]                                                                                   [name], is a 61 y.o. male  DOB 1950/12/05  MRN [id number].  Admission date:  10/09/2011  Discharge Date:  10/11/2011  Primary MD  Eustaquio Boyden, MD  Admitting Physician  Kela Millin, MD  Admission Diagnosis  Diverticulosis [562.10] RECTAL BLEEDING; NAUSEA; EMESIS hematochezia and dysphagia  Discharge Diagnosis     Principal Problem:  *Hematochezia Active Problems:  DIABETES MELLITUS, TYPE II  ANXIETY DEPRESSION  ESSENTIAL HYPERTENSION  GERD  Diverticulosis  Esophageal dysphagia  Internal hemorrhoids without mention of complication    Past Medical History  Diagnosis Date  . Chest pain     normal cath 2008.  CP deemed atypical and non cardiac by cards, ?discogenic  . GERD (gastroesophageal reflux disease)   . Hyperlipidemia   . Hypertension     Essential  . Anxiety   . Depression   . Stroke   . T2DM (type 2 diabetes mellitus)   . Esophageal stricture 06/2007    dilation of stricture  . Orchitis and epididymitis, unspecified   . Back pain     with radiculopathy  . Circadian rhythm sleep disorder, irregular sleep-wake type   . Abdominal wall hernia   . Cocaine abuse     history  . Complication of anesthesia     hard to put patient to sleep  . Chest pain, non-cardiac 2008    adenosine myoview low risk.  EF 60%    Past Surgical History  Procedure Date  . Tonsillectomy childhood  . Carpal tunnel release     Right  . Cardiac catheterization 5/04  . Trigger finger release     Right long finger  . Inguinal hernia repair     bilateral, 5 cm. RLQ Abd Lipoma Excision (Dr. Ezzard Standing)  . Esophagogastroduodenoscopy 07/17/07    Esoph. Stricture dilated (Dr. Marina Goodell)  . Anterior cervical discectomy 10/28/2008    with fusion (Dr. Shon Baton)  . Hematoma evacuation 10/29/2008    (Dr. Shon Baton)  . Direct laryngoscopy 11/02/2008    Extubation  under anesthesia (Dr. Lazarus Salines)  . Colonoscopy      Hospital Course See H&P, Labs, Consult and Test reports for all details in brief, patient was admitted for  bright red blood per rectum CT suggestive of diverticulosis, patient also has history of dysphagia, since admission patient had no further episodes of bleeding, he was seen by GI and underwent EGD with dilation of his dysphagia stricture along with colonoscopy showing diverticulosis and hemorrhoids. It was thought that his bleeding episodes were most likely from his internal hemorrhoids, per gastroenterology patient has been prescribed 2 weeks of hydrocortisone suppositories. We'll request the patient to follow with his primary care physician in 3 days to get a repeat CBC and BMP. He must hold his aspirin for 2 weeks , outpatient followup with GI in 2 weeks thereafter he can resume his aspirin if his bleeding does not recur.  Have discussed his case with Dr. Stan Head post procedure patient okay to advance diet orally, can be  discharged in the next few hours, I will hold him for the next 2-3 hours, we will ambulate him in the hallway monitor his vital signs and then discharge him. His daughter has concerns that last time when he had general anesthesia he syncopized at home. Although this time it was not generalized seizure I will like to monitor him for a few hours prior to discharge. Recheck his vital signs ambulate him & monitor his stability before discharge. Patient is completely symptom free and feels fine after procedure.   Diabetes this type II continue diabetic diet, PO home Meds resume and outpatient monitoring   Incidental finding of bladder wall thickening on CT scan, patient's use was unremarkable, will request primary care physician to refer him to urologist one time in the next 3-4 weeks.   He was noted to have heart rate in mid 50s during his sleep at night, without B blockers on board,  I have stopped his beta blocker dose,  we'll request PCP to continue monitor his vital signs and adjust medications as needed. BP stable patient asymptomatic.   Patient was also noted to be on baclofen, Valium, narcotics, for chronic neck pain, he did not get any of these medications for his 2 day stay here without much discomfort or problems, I have reduced these medications, we'll request PCP to gently taper them off if possible.    Consults  GI  Significant Tests:  See full reports for all details     Ct Abdomen Pelvis W Contrast  10/09/2011  *RADIOLOGY REPORT*  Clinical Data: Rectal bleeding, nausea, vomiting.  CT ABDOMEN AND PELVIS WITH CONTRAST  Technique:  Multidetector CT imaging of the abdomen and pelvis was performed following the standard protocol during bolus administration of intravenous contrast.  Contrast: OMNIPAQUE IOHEXOL 300 MG/ML  SOLN  Comparison: 10/02/2006  Findings: Limited images through the lung bases demonstrate no significant appreciable abnormality. The heart size is within normal limits. No pleural or pericardial effusion.  Subcentimeter hypodensity within the hepatic dome is similar to prior likely a hemangioma based on peripheral nodular enhancement demonstrated previously.  Unremarkable biliary system, spleen, pancreas, right adrenal gland. Left lateral limb adrenal nodularity is without interval change and too small to definitively characterize.  Symmetric renal enhancement.  No hydronephrosis or hydroureter.  No bowel obstruction.  Mild colonic diverticulosis.  No CT evidence for colitis or diverticulitis.  Appendix within normal limits.  No free intraperitoneal air or fluid.  No lymphadenopathy.  There is scattered atherosclerotic calcification of the aorta and its branches. No aneurysmal dilatation.  Anterior-abdominal wall mesh repair inferiorly.  Fat containing inguinal hernias.  Circumferential bladder wall thickening.  No acute osseous finding.  L4-5 degenerative disc disease.  IMPRESSION: Colon  diverticulosis without CT evidence for diverticulitis.  Circumferential bladder wall thickening is nonspecific given incomplete distension.  Correlate with urinalysis if concerned for infection.  Original Report Authenticated By: Waneta Martins, M.D.   EGD  1) Normal EGD - with 6 French Maloney dilation for dysphagia   RECOMMENDATIONS:  post-dilation diet today    Colonoscopy  FINDINGS: Severe diverticulosis was found in the sigmoid colon.  Mild diverticulosis was found in the right colon. This was otherwise a normal examination of the colon. Retroflexed views in the rectum revealed internal hemorrhoids. The time to cecum = minutes. The scope was then withdrawn in minutes from the cecum and the procedure completed.  COMPLICATIONS: None  ENDOSCOPIC IMPRESSION:  1) Severe diverticulosis in the sigmoid colon  2) Mild diverticulosis in the right colon  3) Internal hemorrhoids  4) Otherwise normal examination  RECOMMENDATIONS:  Observe - home later today or tomorrow - hydrocortisone suppositories nightly x 1 week- I hink he probably bled from hemorrhois but could have been from iverticulosis hold ASA x 2 weks he can follow-up with PCP I think and back to GI if recurrent bleeding problems   REPEAT EXAM: In 5 year(s) for routine screening colonoscopy.  consider in 5 due to slightly limited cecal views  Iva Boop, MD, Clement[facility] Graham    Today   Subjective:   Vicenta Dunning today has no headache,no chest abdominal pain,no new weakness tingling or numbness, feels much better wants to go home today.    Objective:   Blood pressure 139/93, pulse 66, temperature 98.4 F (36.9 C), temperature source Oral, resp. rate 20, height 5\' 8"  (1.727 m), weight 107 kg (235 lb 14.3 oz), SpO2 98.00%.  Intake/Output Summary (Last 24 hours) at 10/11/11 1519 Last data filed at 10/11/11 1300  Gross per 24 hour  Intake    120 ml  Output      1 ml  Net    119 ml    Exam Awake Alert, Oriented *3, No  new F.N deficits, Normal affect [place].AT,PERRAL Supple Neck,No JVD, No cervical lymphadenopathy appriciated.  Symmetrical Chest wall movement, Good air movement bilaterally, CTAB RRR,No Gallops,Rubs or new Murmurs, No Parasternal Heave +ve B.Sounds, Abd Soft, Non tender, No organomegaly appriciated, No rebound -guarding or rigidity. No Cyanosis, Clubbing or edema, No new Rash or bruise  Data Review      Recent Results (from the past 240 hour(s))  URINE CULTURE     Status: Normal   Collection Time   10/09/11  6:18 PM      Component Value Range Status Comment   Specimen Description URINE, CLEAN CATCH   Final    Special Requests NONE   Final    Culture  Setup Time [id number]   Final    Colony Count 6,000 COLONIES/ML   Final    Culture INSIGNIFICANT GROWTH   Final    Report Status 10/11/2011 FINAL   Final      CBC w Diff: Lab Results  Component Value Date   WBC 7.4 10/11/2011   HGB 12.8* 10/11/2011   HCT 36.9* 10/11/2011   PLT 243 10/11/2011   LYMPHOPCT 27 10/09/2011   MONOPCT 14* 10/09/2011   EOSPCT 3 10/09/2011   BASOPCT 0 10/09/2011    CMP: Lab Results  Component Value Date   NA 140 10/10/2011   K 4.0 10/10/2011   CL 106 10/10/2011   CO2 25 10/10/2011   BUN 9 10/10/2011   CREATININE 0.95 10/10/2011   PROT 7.6 10/09/2011   ALBUMIN 3.9 10/09/2011   BILITOT 0.2* 10/09/2011   ALKPHOS 55 10/09/2011   AST 10 10/09/2011   ALT 17 10/09/2011  .   Discharge Instructions     Take only if you have muscle spasms.  Follow-up Information    Follow up with Eustaquio Boyden, MD. Schedule an appointment as soon as possible for a visit in 3 days.   Contact information:   [address] [place] [address] [phone]       Follow up with Stan Head, MD. Schedule an appointment as soon as possible for a visit in 10 days.   Contact information:   [address] [facility] [address] [place] [address] [place] [address] [phone]  Discharge  Medications   Medication List  As of 10/11/2011  3:19 PM   START taking these medications         HYDROcodone-acetaminophen 5-325 MG per tablet   Commonly known as: NORCO   Take 1 tablet by mouth every 6 (six) hours as needed for pain.      hydrocortisone 25 MG suppository   Commonly known as: ANUSOL-HC   Place 1 suppository (25 mg total) rectally 2 (two) times daily.      pantoprazole 40 MG tablet   Commonly known as: PROTONIX   Take 1 tablet (40 mg total) by mouth daily at 6 (six) AM.         CHANGE how you take these medications         aspirin EC 81 MG tablet   Take 1 tablet (81 mg total) by mouth daily. Hold for 2 weeks   Start taking on: 10/25/2011   What changed: doctor's instructions      baclofen 10 MG tablet   Commonly known as: LIORESAL   Take 1 tablet (10 mg total) by mouth at bedtime as needed (Take only if you have muscle spasms.). Take only if you have muscle spasms.   What changed: - how often to take the med - reasons to take the med - doctor's instructions      chlordiazePOXIDE 5 MG capsule   Commonly known as: LIBRIUM   Take 1 capsule (5 mg total) by mouth at bedtime. Old medication no refills are requested patient has plenty at home.   What changed: - how often to take the med - reasons to take the med - doctor's instructions         CONTINUE taking these medications         CINNAMON PO      Cranberry 400 MG Caps      fluticasone 50 MCG/ACT nasal spray   Commonly known as: FLONASE   Place 2 sprays into the nose daily.      glucose blood test strip   Use as instructed bid, 250.02      lisinopril 10 MG tablet   Commonly known as: PRINIVIL,ZESTRIL   Take 0.5 tablets (5 mg total) by mouth daily.      metFORMIN 850 MG tablet   Commonly known as: GLUCOPHAGE   Take 1 tablet (850 mg total) by mouth 2 (two) times daily with a meal.      sildenafil 50 MG tablet   Commonly known as: VIAGRA   Take 1 tablet (50 mg total) by mouth daily as needed  for erectile dysfunction.         STOP taking these medications         metoprolol succinate 50 MG 24 hr tablet      oxyCODONE-acetaminophen 10-325 MG per tablet          Where to get your medications    These are the prescriptions that you need to pick up. We sent them to a specific pharmacy, so you will need to go there to get them.   [facility] - [place], [place] - [id number], [address]    [address], [place] [address]    Phone: [phone]        hydrocortisone 25 MG suppository   pantoprazole 40 MG tablet         You may get these medications from any pharmacy.  chlordiazePOXIDE 5 MG capsule   HYDROcodone-acetaminophen 5-325 MG per tablet         Information on where to get these meds is not yet available. Ask your nurse or doctor.         aspirin EC 81 MG tablet   baclofen 10 MG tablet            Total Time in preparing paper work, data evaluation and todays exam - 35 minutes  Leroy Sea M.D on 10/11/2011 at 3:19 PM  [facility] Office  [phone]

## 2011-10-11 NOTE — Telephone Encounter (Signed)
Yes.  I see he was sent home today.  plz notify I was informed and able to review all tests done - EGD/Colonoscopy.  Sent home with suppositories nightly for 1 wk.

## 2011-10-11 NOTE — Discharge Instructions (Signed)
Follow with Primary MD Eustaquio Boyden, MD in 3 days , hold your aspirin for 2 weeks.  Get CBC, BMP, checked 3 days by Primary MD and again as instructed by your Primary MD.   Get Medicines reviewed and adjusted.  Please request your Prim.MD to go over all Hospital Tests and Procedure/Radiological results at the follow up, please get all Hospital records sent to your Prim MD by signing hospital release before you go home.  Activity: As tolerated with Full fall precautions use walker/cane & assistance as needed  Diet: Heart healthy low carbohydrate soft diet advance as tolerated, Aspiration precautions.  For Heart failure patients - Check your Weight same time everyday, if you gain over 2 pounds, or you develop in leg swelling, experience more shortness of breath or chest pain, call your Primary MD immediately. Follow Cardiac Low Salt Diet and 1.8 lit/day fluid restriction.  Disposition Home    If you experience worsening of your admission symptoms, develop shortness of breath, life threatening emergency, suicidal or homicidal thoughts you must seek medical attention immediately by calling 911 or calling your MD immediately  if symptoms less severe.  You Must read complete instructions/literature along with all the possible adverse reactions/side effects for all the Medicines you take and that have been prescribed to you. Take any new Medicines after you have completely understood and accpet all the possible adverse reactions/side effects.   Do not drive if your were admitted for syncope or siezures until you have seen by Primary MD or a Neurologist and advised to drive.  Do not drive when taking Pain medications.    Do not take more than prescribed Pain, Sleep and Anxiety Medications  Special Instructions: If you have smoked or chewed Tobacco  in the last 2 yrs please stop smoking, stop any regular Alcohol  and or any Recreational drug use.  Wear Seat belts while driving.

## 2011-10-11 NOTE — Op Note (Signed)
[facility] [address] [place], [place]  [address]  ENDOSCOPY PROCEDURE REPORT  PATIENT:  [name], [name]  MR#:  [id number] BIRTHDATE:  [birth year], 60 yrs. old  GENDER:  male  ENDOSCOPIST:  Iva Boop, MD, [facility]  PROCEDURE DATE:  10/11/2011 PROCEDURE:  EGD, diagnostic 19147 ASA CLASS:  Class II INDICATIONS:  dysphagia  MEDICATIONS:   Fentanyl 75 mcg, Versed 8 mg IV, Benadryl 12.5 mg IV TOPICAL ANESTHETIC:  Cetacaine Spray  DESCRIPTION OF PROCEDURE:   After the risks benefits and alternatives of the procedure were thoroughly explained, informed consent was obtained.  The Pentax Gastroscope [id number] endoscope was introduced through the mouth and advanced to the second portion of the duodenum, without limitations.  The instrument was slowly withdrawn as the mucosa was fully examined. <<PROCEDUREIMAGES>>  The upper, middle, and distal third of the esophagus were carefully inspected and no abnormalities were noted. The z-line was well seen at the GEJ. The endoscope was pushed into the fundus which was normal including a retroflexed view. The antrum,gastric body, first and second part of the duodenum were unremarkable. Retroflexed views revealed no abnormalities.    The scope was then withdrawn from the patient, a 3 [place] Maloney dilator was passed without heme or difficulty, and the procedure completed.  COMPLICATIONS:  None  ENDOSCOPIC IMPRESSION: 1) Normal EGD - with 54 French Maloney dilation for dysphagia RECOMMENDATIONS: post-dilation diet today colonoscopy next  Iva Boop, MD, [name]  CC:  n. eSIGNED:   Iva Boop at 10/11/2011 11:33 AM  Vicenta Dunning, [phone]

## 2011-10-11 NOTE — Op Note (Signed)
[facility] [address] [place], [place]  [address]  COLONOSCOPY PROCEDURE REPORT  PATIENT:  [name], [name]  MR#:  [id number] BIRTHDATE:  01/24/1951, 60 yrs. old  GENDER:  male ENDOSCOPIST:  Iva Boop, MD, [facility]  PROCEDURE DATE:  10/11/2011 PROCEDURE:  Colonoscopy 46962 ASA CLASS:  Class II INDICATIONS:  hematochezia Hgb 12.8 MEDICATIONS:   There was residual sedation effect present from prior procedure., Fentanyl 25 mcg IV, Versed 1 mg IV  DESCRIPTION OF PROCEDURE:   After the risks benefits and alternatives of the procedure were thoroughly explained, informed consent was obtained.  Digital rectal exam was performed and revealed no abnormalities and normal prostate.   The Pentax Colonoscope [id number] endoscope was introduced through the anus and advanced to the cecum, which was identified by both the appendix and ileocecal valve, without limitations.  The quality of the prep was excellent, using Colyte.  The instrument was then slowly withdrawn as the colon was fully examined. <<PROCEDUREIMAGES>>  FINDINGS:  Severe diverticulosis was found in the sigmoid colon. Mild diverticulosis was found in the right colon.  This was otherwise a normal examination of the colon.   Retroflexed views in the rectum revealed internal hemorrhoids.    The time to cecum =  minutes. The scope was then withdrawn in  minutes from the cecum and the procedure completed. COMPLICATIONS:  None ENDOSCOPIC IMPRESSION: 1) Severe diverticulosis in the sigmoid colon 2) Mild diverticulosis in the right colon 3) Internal hemorrhoids 4) Otherwise normal examination RECOMMENDATIONS: Observe - home later today or tomorrow - hydrocortisone suppositories nightly x 1 week- I hink he probably bled from hemorrhois but could have been from iverticulosis hold ASA x 2 weks  he can follow-up with PCP I think and back to GI if recurrent bleeding problems REPEAT EXAM:  In 5 year(s) for routine  screening colonoscopy. consider in 5 due to slightly limited cecal views  Iva Boop, MD, Clement[facility] Graham  n. eSIGNED:   Iva Boop at 10/11/2011 11:40 AM  Vicenta Dunning, [phone]

## 2011-10-11 NOTE — Progress Notes (Signed)
Pt post endo alert and oriented. VSS. Up in chair and drinking liquids without difficulty. Pt ambulated length of halllway with RN. Gait steady and no assist needed for ambulation. Pt back to chair

## 2011-10-11 NOTE — Telephone Encounter (Signed)
Caller: Daegon/Patient; Phone Number: [phone] room #1406 ; Message from caller: Patient is admitted to [facility]. Calling to see if Dr. Sharen Hones has been notifed and if Dr Sharen Hones will follow-up with hospital.

## 2011-10-12 ENCOUNTER — Encounter (HOSPITAL_COMMUNITY): Payer: Self-pay | Admitting: Internal Medicine

## 2011-10-12 ENCOUNTER — Telehealth: Payer: Self-pay | Admitting: *Deleted

## 2011-10-12 NOTE — Telephone Encounter (Signed)
Message left notifying patient.

## 2011-10-12 NOTE — Telephone Encounter (Signed)
Questions came up concerning pt's appt next week. Pt was discharged by Dr Jarold Motto for being abusive to staff, however, Dr [name] did ECL on hospitalized pt. Dr [name] and his notes state pt is to f/u with his PCP. His bleeding was probably from hemorrhoids and he only needs to see [place] if bleeding recurs. Dr [name] has stated he may see the pt if that occurs. Phoned pt and explained he only needs to see Dr Sharen Hones for hospital f/u; pt was very cordial and stated understanding.

## 2011-10-16 ENCOUNTER — Encounter: Payer: Self-pay | Admitting: Family Medicine

## 2011-10-16 ENCOUNTER — Ambulatory Visit (INDEPENDENT_AMBULATORY_CARE_PROVIDER_SITE_OTHER): Payer: Medicare Other | Admitting: Family Medicine

## 2011-10-16 ENCOUNTER — Ambulatory Visit: Payer: Medicare Other | Admitting: Gastroenterology

## 2011-10-16 VITALS — BP 124/82 | HR 72 | Temp 97.9°F | Wt 230.2 lb

## 2011-10-16 DIAGNOSIS — M25519 Pain in unspecified shoulder: Secondary | ICD-10-CM

## 2011-10-16 DIAGNOSIS — N3289 Other specified disorders of bladder: Secondary | ICD-10-CM

## 2011-10-16 DIAGNOSIS — I10 Essential (primary) hypertension: Secondary | ICD-10-CM

## 2011-10-16 DIAGNOSIS — K921 Melena: Secondary | ICD-10-CM

## 2011-10-16 DIAGNOSIS — E119 Type 2 diabetes mellitus without complications: Secondary | ICD-10-CM

## 2011-10-16 DIAGNOSIS — K648 Other hemorrhoids: Secondary | ICD-10-CM

## 2011-10-16 LAB — BASIC METABOLIC PANEL
Chloride: 102 mEq/L (ref 96–112)
Creatinine, Ser: 1 mg/dL (ref 0.4–1.5)
Potassium: 4.2 mEq/L (ref 3.5–5.1)

## 2011-10-16 LAB — CBC WITH DIFFERENTIAL/PLATELET
Basophils Absolute: 0 10*3/uL (ref 0.0–0.1)
Eosinophils Absolute: 0.1 10*3/uL (ref 0.0–0.7)
Lymphocytes Relative: 32.7 % (ref 12.0–46.0)
MCHC: 32.4 g/dL (ref 30.0–36.0)
Neutro Abs: 3.7 10*3/uL (ref 1.4–7.7)
Neutrophils Relative %: 56.3 % (ref 43.0–77.0)
RDW: 15 % — ABNORMAL HIGH (ref 11.5–14.6)

## 2011-10-16 LAB — HEMOGLOBIN A1C: Hgb A1c MFr Bld: 6.9 % — ABNORMAL HIGH (ref 4.6–6.5)

## 2011-10-16 NOTE — Assessment & Plan Note (Signed)
during hospitalization noted bradycardia so beta blocker stopped - doing well off this med and only on lisinopril.

## 2011-10-16 NOTE — Progress Notes (Signed)
Subjective:    Patient ID: [name], male    DOB: 1950/10/04, 61 y.o.   MRN: [id number]  HPI CC: hosp f/u  Father's funeral was last week.  While getting ready for this, had BM with bright red blood in commode and with wiping.  Called nurse line, recommended call 911.  [facility] 6[facility]18-20/2013 for rectal bleeding, and after evaluation thought to be hemorrhoidal bleed.  Records reviewed.  Found to have diverticulosis but not itis by CT scan.  Also endorsed dysphagia so EGD done by Dr. [name] with dilation of esoph stricture which resolved dysphagia.  Colonoscopy showing diverticulosis and internal hemorrhoids.   Some bladder wall thickening was found on CT scan but UA was negative for infection.  In setting of recent prostatitis.  No urinary sxs.  HTN - Some bradycardia at night so b blocker was stopped.  Blood pressure has remained stable off beta blocker.  continues low dose lisinopril.  Taking anusol suppositories bid.  rec recheck BMP and CBC today.  DM - brings log showing sugars ranging from 80s to 120s. Lab Results  Component Value Date   HGBA1C 7.2* 04/06/2011   Overall feeling much better since discharge from hospital.  Denies fevers/chills, abd pain or nausea/vomiting, no further blood in stool.  Past Medical History  Diagnosis Date  . Chest pain     normal cath 2008.  CP deemed atypical and non cardiac by cards, ?discogenic  . GERD (gastroesophageal reflux disease)   . Hyperlipidemia   . Hypertension     Essential  . Anxiety   . Depression   . Stroke   . T2DM (type 2 diabetes mellitus)   . Esophageal stricture 06/2007    dilation of stricture  . History of epididymitis   . Back pain     with radiculopathy  . Circadian rhythm sleep disorder, irregular sleep-wake type   . Abdominal wall hernia   . History of cocaine use     history  . Complication of anesthesia     hard to put patient to sleep  . Chest pain, non-cardiac 2008    adenosine myoview  low risk.  EF 60%  . Diverticulosis 2013    by CT scan  . History of prostatitis 2013   Past Surgical History  Procedure Date  . Tonsillectomy childhood  . Carpal tunnel release     Right  . Cardiac catheterization 5/04  . Trigger finger release     Right long finger  . Inguinal hernia repair     bilateral, 5 cm. RLQ Abd Lipoma Excision (Dr. Ezzard Standing)  . Esophagogastroduodenoscopy 07/17/07    Esoph. Stricture dilated (Dr. Marina Goodell)  . Anterior cervical discectomy 10/28/2008    with fusion (Dr. Shon Baton)  . Hematoma evacuation 10/29/2008    (Dr. Shon Baton)  . Direct laryngoscopy 11/02/2008    Extubation under anesthesia (Dr. Lazarus Salines)  . Colonoscopy   . Colonoscopy 10/11/2011    Procedure: COLONOSCOPY;  Surgeon: Iva Boop, MD;  Location: [facility] ENDOSCOPY;  Service: Endoscopy;  Laterality: N/A;  . Esophagogastroduodenoscopy 10/11/2011    Procedure: ESOPHAGOGASTRODUODENOSCOPY (EGD);  Surgeon: Iva Boop, MD;  Location: [facility]COPY;  Service: Endoscopy;  Laterality: N/A;   Review of Systems Per HPI    Objective:   Physical Exam  Nursing note and vitals reviewed. Constitutional: He appears well-developed and well-nourished. No distress.  HENT:  Head: Normocephalic and atraumatic.  Mouth/Throat: Oropharynx is clear and moist. No oropharyngeal exudate.  Eyes: Conjunctivae and  EOM are normal. Pupils are equal, round, and reactive to light. No scleral icterus.  Neck: Normal range of motion. Neck supple.  Cardiovascular: Normal rate, regular rhythm, normal heart sounds and intact distal pulses.   No murmur heard. Pulmonary/Chest: Breath sounds normal. No respiratory distress. He has no wheezes. He has no rales.  Abdominal: Soft. Bowel sounds are normal. He exhibits no distension and no mass. There is no tenderness. There is no rebound and no guarding.  Musculoskeletal: He exhibits no edema.  Lymphadenopathy:    He has no cervical adenopathy.  Skin: Skin is warm and dry. No rash noted.         Assessment & Plan:

## 2011-10-16 NOTE — Assessment & Plan Note (Signed)
Anticipate improved control as evidenced by latest blood sugars.  Recheck A1c today. H/o lows in past.

## 2011-10-16 NOTE — Patient Instructions (Addendum)
Hold metoprolol for now.  Blood pressure is looking good. Continue lisinopril. For diabetes - continue metformin 850mg  twice a day. Blood work today to recheck blood counts. Hold aspirin for a total of 2 weeks, may restart July 5th. We will watch mild swelling of bladder wall for now. Take suppository only once a day for next 3 days then may stop.

## 2011-10-16 NOTE — Assessment & Plan Note (Signed)
New, found on CT scan. Recent prostatitis, no UTI sxs, no microhematuria on UA done in hospital. Will refer to urology over next few weeks for further evaluation.

## 2011-10-16 NOTE — Assessment & Plan Note (Signed)
Per ortho. Was prior on oxycodone , but this was changed to hydrocodone and pt doing well with pain with this med.  Will continue.  Have removed oxycodone from med list.

## 2011-10-16 NOTE — Assessment & Plan Note (Addendum)
Largely seems resolved.  Recommended decrease anusol suppositories to daily for next 3 days then may stop. Reviewed all records, seems bleed likely came from internal hemorrhoids. No need for f/u with GI, unless continued bleed. Holding aspirin for 2 wks.

## 2011-10-24 ENCOUNTER — Other Ambulatory Visit: Payer: Self-pay | Admitting: Family Medicine

## 2011-10-24 NOTE — Telephone Encounter (Signed)
In Dr Timoteo Expose absence can refil short supply Px written for call in   thanks

## 2011-10-24 NOTE — Telephone Encounter (Signed)
OK to refill in Dr. Timoteo Expose absence? Please send back to me.

## 2011-10-24 NOTE — Telephone Encounter (Signed)
Called in Rx as instructed.  

## 2011-11-15 ENCOUNTER — Telehealth: Payer: Self-pay

## 2011-11-15 MED ORDER — HYDROCORTISONE ACETATE 25 MG RE SUPP: 25.0000 mg | Freq: Two times a day (BID) | RECTAL | Status: AC

## 2011-11-15 NOTE — Telephone Encounter (Signed)
Pt had constipated BM with straining and saw streaks of bright red blood on stool( commode water was not red); also small amount of bright red blood on tissue when wiped. Dr Thedore Mins had given pt  Anusol HC 25 mg suppository inserting one suppository rectally twice a day . Pt request Anusol HC  supp to [facility]. Please advise.

## 2011-11-15 NOTE — Telephone Encounter (Addendum)
plz notify this has been sent in to [facility].  Also needs treat constipation to prevent this from happening.  rec start with daily docusate for stool softener, let us know if this doesn't help constipation.

## 2011-11-16 NOTE — Telephone Encounter (Signed)
Patient notified. He will start daily docusate as directed.

## 2011-11-20 ENCOUNTER — Encounter: Payer: Self-pay | Admitting: Family Medicine

## 2011-11-22 ENCOUNTER — Encounter: Payer: Self-pay | Admitting: Family Medicine

## 2011-12-12 ENCOUNTER — Ambulatory Visit: Payer: Medicare Other | Admitting: Family Medicine

## 2012-01-08 ENCOUNTER — Ambulatory Visit (INDEPENDENT_AMBULATORY_CARE_PROVIDER_SITE_OTHER): Payer: Medicare Other | Admitting: Family Medicine

## 2012-01-08 ENCOUNTER — Encounter: Payer: Self-pay | Admitting: Family Medicine

## 2012-01-08 VITALS — BP 130/86 | HR 80 | Temp 98.3°F | Wt 227.2 lb

## 2012-01-08 DIAGNOSIS — E119 Type 2 diabetes mellitus without complications: Secondary | ICD-10-CM

## 2012-01-08 DIAGNOSIS — Z23 Encounter for immunization: Secondary | ICD-10-CM

## 2012-01-08 DIAGNOSIS — E669 Obesity, unspecified: Secondary | ICD-10-CM

## 2012-01-08 DIAGNOSIS — M542 Cervicalgia: Secondary | ICD-10-CM

## 2012-01-08 DIAGNOSIS — G8929 Other chronic pain: Secondary | ICD-10-CM

## 2012-01-08 DIAGNOSIS — N3289 Other specified disorders of bladder: Secondary | ICD-10-CM

## 2012-01-08 DIAGNOSIS — I10 Essential (primary) hypertension: Secondary | ICD-10-CM

## 2012-01-08 NOTE — Assessment & Plan Note (Signed)
Chronic, stable. Continue lisinopril.  

## 2012-01-08 NOTE — Patient Instructions (Addendum)
Return in 3 months fasting for blood work and afterwards for a physical. Good to see you otday - keep up the good work with diabetes and weight. Try baclofen again for muscle stiffness

## 2012-01-08 NOTE — Assessment & Plan Note (Signed)
Seen by uro. Responding well to flomax, cipro.  See uro c/s.

## 2012-01-08 NOTE — Assessment & Plan Note (Signed)
Discussed pro/con of pharmaceuticals. Recommend continue careful dietary monitoring. Await clear to restart exercising by ortho.

## 2012-01-08 NOTE — Assessment & Plan Note (Signed)
Chronic, stable. Check A1c next visit. rtc 3 mo.

## 2012-01-08 NOTE — Assessment & Plan Note (Signed)
rec restart baclofen

## 2012-01-08 NOTE — Progress Notes (Signed)
  Subjective:    Patient ID: [name], male    DOB: 13-Sep-1950, 61 y.o.   MRN: [id number]  HPI CC: 3 mo f/u  Father passed away recently. To see Dr. Shon Baton and Ranell Patrick next month. Worried about weight - has been losing weight but ortho recommended stopping exercising.  Circumferential bladder wall thickening - saw Dr. 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  HTN - No HA, vision changes, CP/tightness, SOB, leg swelling.  Compliant with lisinopril 5mg  daily.    DM - no paresthesias.  Compliant with metformin 850mg  bid.  Declines nutritionist referral.  Foot exam today.  Eye exam 2 wks ago.  Checks sugars once daily   Brings log - sugars ranging from 80-140 fasting, low 100s afternoon.   Lab Results  Component Value Date   HGBA1C 6.9* 10/16/2011    Wt Readings from Last 3 Encounters:  01/08/12 227 lb 4 oz (103.08 kg)  10/16/11 230 lb 4 oz (104.441 kg)  10/09/11 235 lb 14.3 oz (107 kg)   Past Medical History  Diagnosis Date  . Chest pain     normal cath 2008.  CP deemed atypical and non cardiac by cards, ?discogenic  . GERD (gastroesophageal reflux disease)   . Hyperlipidemia   . Hypertension     Essential  . Anxiety   . Depression   . Stroke   . T2DM (type 2 diabetes mellitus)   . Esophageal stricture 06/2007    dilation of stricture  . History of epididymitis   . Back pain     with radiculopathy  . Circadian rhythm sleep disorder, irregular sleep-wake type   . Abdominal wall hernia   . History of cocaine use   . Complication of anesthesia     hard to put patient to sleep  . Chest pain, non-cardiac 2008    adenosine myoview low risk.  EF 60%  . Diverticulosis 2013    by CT scan  . History of prostatitis 08/2011    ?chronic/simmering, treated with 1 mo cirpo  . Chronic neck pain     s/p ACDF C5-7 2010 ([facility])    Review of Systems Per HPI    Objective:   Physical Exam    Nursing note and vitals reviewed. Constitutional: He appears well-developed and well-nourished. No distress.  HENT:  Head: Normocephalic and atraumatic.  Right Ear: External ear normal.  Left Ear: External ear normal.  Nose: Nose normal.  Mouth/Throat: Oropharynx is clear and moist. No oropharyngeal exudate.  Eyes: Conjunctivae normal and EOM are normal. Pupils are equal, round, and reactive to light. No scleral icterus.  Neck: Normal range of motion. Neck supple.  Cardiovascular: Normal rate, regular rhythm, normal heart sounds and intact distal pulses.   No murmur heard. Pulmonary/Chest: Effort normal and breath sounds normal. No respiratory distress. He has no wheezes. He has no rales.  Musculoskeletal: He exhibits no edema.       Diabetic foot exam: Normal inspection No skin breakdown No calluses  Normal DP/PT pulses Normal sensation to light tough and monofilament Nails normal  Lymphadenopathy:    He has no cervical adenopathy.  Skin: Skin is warm and dry. No rash noted.  Psychiatric: He has a normal mood and affect.       Assessment & Plan:

## 2012-02-12 ENCOUNTER — Encounter: Payer: Self-pay | Admitting: Family Medicine

## 2012-02-12 ENCOUNTER — Telehealth: Payer: Self-pay | Admitting: Family Medicine

## 2012-02-12 ENCOUNTER — Ambulatory Visit (INDEPENDENT_AMBULATORY_CARE_PROVIDER_SITE_OTHER): Payer: Medicare Other | Admitting: Family Medicine

## 2012-02-12 VITALS — BP 124/78 | HR 80 | Temp 98.3°F | Wt 227.0 lb

## 2012-02-12 DIAGNOSIS — R51 Headache: Secondary | ICD-10-CM

## 2012-02-12 DIAGNOSIS — E119 Type 2 diabetes mellitus without complications: Secondary | ICD-10-CM

## 2012-02-12 DIAGNOSIS — R519 Headache, unspecified: Secondary | ICD-10-CM | POA: Insufficient documentation

## 2012-02-12 DIAGNOSIS — I10 Essential (primary) hypertension: Secondary | ICD-10-CM

## 2012-02-12 DIAGNOSIS — G459 Transient cerebral ischemic attack, unspecified: Secondary | ICD-10-CM

## 2012-02-12 DIAGNOSIS — D179 Benign lipomatous neoplasm, unspecified: Secondary | ICD-10-CM

## 2012-02-12 MED ORDER — BACLOFEN 10 MG PO TABS: 10.0000 mg | ORAL_TABLET | Freq: Every evening | ORAL | Status: DC | PRN

## 2012-02-12 NOTE — Assessment & Plan Note (Addendum)
Chronic, stable. Anticipate good control based on log of sugars. Will check A1c and microalb when returns for physical next visit. Foot exam today.

## 2012-02-12 NOTE — Patient Instructions (Addendum)
Restart aspirin at 81mg  daily - enteric coated. Blood pressure is great.  Sugars are much better controlled. Knot on head feels like skull. I wonder if these headaches are coming from muscle spasm of neck so I've refilled your baclofen.  Try this.   Let me know how you are doing. Return for physical in 4-6 months, prior fasting for blood work.

## 2012-02-12 NOTE — Assessment & Plan Note (Signed)
Recent headaches.  Anticipate stemming for cervicogenic DDD, and possible TTH component.  Restart baclofen, update me if this doesn't resolve HA. nonfocal neuro exam today. Reasurred that knot on occipital head is actually bump in skull.

## 2012-02-12 NOTE — Assessment & Plan Note (Signed)
H/o lipoma removed R forearm. Anticipate new lipoma on posterior neck.  As not bothersome, recommended monitor for now.  alternatively could be small cyst.

## 2012-02-12 NOTE — Assessment & Plan Note (Signed)
Recommended restart ASA.

## 2012-02-12 NOTE — Progress Notes (Signed)
Subjective:    Patient ID: [name], male    DOB: [birth year], 61 y.o.   MRN: [id number]  HPI CC: HA  3-4 mo h/o HA described as pain in back of head, achey.  Sometimes stems from neck.  No photo/phonophobia or nausea with this.  Gets more tired with this.  Occasional dizziness.  Intermittent.  Last week had headache every day.  This week better.  So far hasn't tried any medicines for this.    Occasional neck pain.  Planning on seeing neurosurgeon (Dr. Shon Baton) in f/u.  H/o ACDF.  Also noticing knot on back of head, this is where headaches come from.  Knot present for about 3-4 months.  DM - Brings log of sugars - much better controlled.  Compliant with metformin 850mg  bid.  Last eye exam around 12/2011.  H/o glaucoma.  H/o cataracts Lab Results  Component Value Date   HGBA1C 6.9* 10/16/2011   H/o TIA vs CVA, off aspirin.  Recommended restart.  "forgot" to buy aspirin.  Wt Readings from Last 3 Encounters:  02/12/12 227 lb (102.967 kg)  01/08/12 227 lb 4 oz (103.08 kg)  10/16/11 230 lb 4 oz (104.441 kg)    Past Medical History  Diagnosis Date  . GERD (gastroesophageal reflux disease)   . Hyperlipidemia   . Hypertension     Essential  . Anxiety   . Depression   . Stroke     TIA as no residual deficits?  . T2DM (type 2 diabetes mellitus)   . Esophageal stricture 06/2007    dilation of stricture  . History of epididymitis   . Back pain     with radiculopathy  . Circadian rhythm sleep disorder, irregular sleep-wake type   . Abdominal wall hernia   . History of cocaine use   . Complication of anesthesia     hard to put patient to sleep  . Chest pain, non-cardiac 2008    adenosine myoview low risk.  EF 60%.  normal cath 2008, deemed atypical /noncardiac, ?discogenic  . Diverticulosis 2013    by CT scan  . History of prostatitis 08/2011    ?chronic/simmering, treated with 1 mo cirpo  . Chronic neck pain     s/p ACDF C5-7 2010 ([facility])     Review of Systems Per  HPI    Objective:   Physical Exam  Nursing note and vitals reviewed. Constitutional: He appears well-developed and well-nourished. No distress.  HENT:  Head: Normocephalic and atraumatic.  Right Ear: External ear normal.  Left Ear: External ear normal.  Nose: Nose normal.  Mouth/Throat: Oropharynx is clear and moist. No oropharyngeal exudate.       Occipital scalp with hard immobile bump - consistent with skull  Eyes: Conjunctivae normal and EOM are normal. Pupils are equal, round, and reactive to light. No scleral icterus.  Neck: Normal range of motion. Neck supple.       Small soft tissue mass posterior midline cervical neck - mobile  Cardiovascular: Normal rate, regular rhythm, normal heart sounds and intact distal pulses.   No murmur heard. Pulmonary/Chest: Effort normal and breath sounds normal. No respiratory distress. He has no wheezes. He has no rales.  Musculoskeletal: He exhibits no edema.       Diabetic foot exam: Normal inspection Dry skin on soles No calluses  Normal DP/PT pulses Normal sensation to light touch and monofilament Nails normal  Lymphadenopathy:    He has no cervical adenopathy.  Neurological:  CN 2-12 intact.  Skin: Skin is warm and dry. No rash noted.  Psychiatric: He has a normal mood and affect.       Assessment & Plan:

## 2012-02-12 NOTE — Telephone Encounter (Signed)
[facility] Triage Call Report Triage Record Num: [id number] Operator: Roselyn Meier Patient Name: [name] Call Date & Time: 02/11/2012 5:45:34PM Patient Phone: [phone] PCP: Eustaquio Boyden Patient Gender: Male PCP Fax : [phone] Patient DOB: 08/23/1950 Practice Name[facility] Reason for Call: Caller: Saige/Patient; PCP: Eustaquio Boyden [facility]); CB#: [phone]; Call regarding Headaches; Pt reports headaches x 3-4 months. Pt reports mild pain. Pt also states he has a knot of the back of his head that is pain. Triaged patient per Headache Protocol. See Provider within 24 hours Disposition for 'Constant headache and unrelieved with nonprescription medications'. RN offered to make pt appt, and pt states he already has appt scheduled for 1215 (02/12/12) with Dr Sharen Hones. RN advised pt to call back if symptoms worsen Protocol(s) Used: Headache Recommended Outcome per Protocol: See Provider within 24 hours Reason for Outcome: Constant headache AND unrelieved with nonprescription medications Care Advice: ~ Call provider immediately if headache becomes more severe. 02/11/2012 5:53:38PM Page 1 of 1 [email or web address]

## 2012-02-12 NOTE — Assessment & Plan Note (Signed)
Great control. Continue current regimen.

## 2012-02-12 NOTE — Telephone Encounter (Signed)
Will see today.  

## 2012-02-14 ENCOUNTER — Other Ambulatory Visit: Payer: Self-pay | Admitting: Orthopedic Surgery

## 2012-02-14 DIAGNOSIS — M542 Cervicalgia: Secondary | ICD-10-CM

## 2012-02-18 ENCOUNTER — Other Ambulatory Visit: Payer: Self-pay | Admitting: Family Medicine

## 2012-02-18 ENCOUNTER — Ambulatory Visit
Admission: RE | Admit: 2012-02-18 | Discharge: 2012-02-18 | Disposition: A | Discharge status: Home / Self Care | Payer: Medicare Other | Source: Ambulatory Visit | Attending: Orthopedic Surgery | Admitting: Orthopedic Surgery

## 2012-02-18 DIAGNOSIS — M542 Cervicalgia: Secondary | ICD-10-CM

## 2012-04-01 ENCOUNTER — Other Ambulatory Visit: Payer: Medicare Other

## 2012-04-07 ENCOUNTER — Encounter: Payer: Self-pay | Admitting: Family Medicine

## 2012-04-08 ENCOUNTER — Encounter: Payer: Medicare Other | Admitting: Family Medicine

## 2012-05-28 ENCOUNTER — Encounter: Payer: Self-pay | Admitting: Family Medicine

## 2012-06-09 ENCOUNTER — Other Ambulatory Visit: Payer: Self-pay | Admitting: Family Medicine

## 2012-06-09 ENCOUNTER — Other Ambulatory Visit: Payer: Medicare Other

## 2012-06-09 DIAGNOSIS — E119 Type 2 diabetes mellitus without complications: Secondary | ICD-10-CM

## 2012-06-09 DIAGNOSIS — I10 Essential (primary) hypertension: Secondary | ICD-10-CM

## 2012-06-09 DIAGNOSIS — E7800 Pure hypercholesterolemia, unspecified: Secondary | ICD-10-CM

## 2012-06-09 DIAGNOSIS — N419 Inflammatory disease of prostate, unspecified: Secondary | ICD-10-CM

## 2012-06-16 ENCOUNTER — Encounter: Payer: Self-pay | Admitting: Family Medicine

## 2012-06-16 ENCOUNTER — Ambulatory Visit (INDEPENDENT_AMBULATORY_CARE_PROVIDER_SITE_OTHER): Payer: Medicare Other | Admitting: Family Medicine

## 2012-06-16 VITALS — BP 118/82 | HR 84 | Temp 98.3°F | Ht 67.0 in | Wt 241.8 lb

## 2012-06-16 DIAGNOSIS — C61 Malignant neoplasm of prostate: Secondary | ICD-10-CM | POA: Insufficient documentation

## 2012-06-16 DIAGNOSIS — E7800 Pure hypercholesterolemia, unspecified: Secondary | ICD-10-CM

## 2012-06-16 DIAGNOSIS — D509 Iron deficiency anemia, unspecified: Secondary | ICD-10-CM

## 2012-06-16 DIAGNOSIS — I10 Essential (primary) hypertension: Secondary | ICD-10-CM

## 2012-06-16 DIAGNOSIS — Z23 Encounter for immunization: Secondary | ICD-10-CM

## 2012-06-16 DIAGNOSIS — F341 Dysthymic disorder: Secondary | ICD-10-CM

## 2012-06-16 DIAGNOSIS — E119 Type 2 diabetes mellitus without complications: Secondary | ICD-10-CM

## 2012-06-16 DIAGNOSIS — Z0000 Encounter for general adult medical examination without abnormal findings: Secondary | ICD-10-CM

## 2012-06-16 DIAGNOSIS — N402 Nodular prostate without lower urinary tract symptoms: Secondary | ICD-10-CM

## 2012-06-16 DIAGNOSIS — J310 Chronic rhinitis: Secondary | ICD-10-CM

## 2012-06-16 DIAGNOSIS — Z125 Encounter for screening for malignant neoplasm of prostate: Secondary | ICD-10-CM

## 2012-06-16 HISTORY — DX: Malignant neoplasm of prostate: C61

## 2012-06-16 LAB — CBC WITH DIFFERENTIAL/PLATELET
Basophils Absolute: 0 10*3/uL (ref 0.0–0.1)
Basophils Relative: 0.5 % (ref 0.0–3.0)
Eosinophils Absolute: 0.2 10*3/uL (ref 0.0–0.7)
HCT: 41.6 % (ref 39.0–52.0)
Hemoglobin: 14 g/dL (ref 13.0–17.0)
Lymphocytes Relative: 32 % (ref 12.0–46.0)
Lymphs Abs: 1.6 10*3/uL (ref 0.7–4.0)
MCHC: 33.7 g/dL (ref 30.0–36.0)
MCV: 89.2 fl (ref 78.0–100.0)
Monocytes Absolute: 0.6 10*3/uL (ref 0.1–1.0)
Neutro Abs: 2.5 10*3/uL (ref 1.4–7.7)
RDW: 14.7 % — ABNORMAL HIGH (ref 11.5–14.6)

## 2012-06-16 LAB — BASIC METABOLIC PANEL
BUN: 10 mg/dL (ref 6–23)
Calcium: 9 mg/dL (ref 8.4–10.5)
GFR: 100.75 mL/min (ref >60.00)
Potassium: 4.1 mEq/L (ref 3.5–5.1)

## 2012-06-16 LAB — LIPID PANEL
Cholesterol: 185 mg/dL (ref 0–200)
HDL: 33.4 mg/dL — ABNORMAL LOW (ref >39.00)
LDL Cholesterol: 129 mg/dL — ABNORMAL HIGH (ref 0–99)
VLDL: 22.2 mg/dL (ref 0.0–40.0)

## 2012-06-16 LAB — HEMOGLOBIN A1C: Hgb A1c MFr Bld: 7 % — ABNORMAL HIGH (ref 4.6–6.5)

## 2012-06-16 LAB — MICROALBUMIN / CREATININE URINE RATIO: Microalb Creat Ratio: 1.4 mg/g (ref 0.0–30.0)

## 2012-06-16 MED ORDER — FLUTICASONE PROPIONATE 50 MCG/ACT NA SUSP: 2.0000 | Freq: Every day | NASAL | Status: DC

## 2012-06-16 NOTE — Progress Notes (Signed)
Subjective:    Patient ID: [name], male    DOB: Mar 10, 1951, 62 y.o.   MRN: [id number]  HPI CC: CPE  Has had some itching in legs relieved by Tums.    Head congestion - 1-2 wks ago.  Blowing nose with bloody or clear mucous.  Mild cough.  No fevers/chills.  So far has tried nothing for this.  More rhinorrhea than congestion.  HTN - great control on low dose lisinopril. DM - compliant with metformin.  Eye exam 03/2012.  Trying to eat more sweet potatoes and vegetables.  This AM cbg 104.   Lab Results  Component Value Date   HGBA1C 6.9* 10/16/2011   Wt Readings from Last 3 Encounters:  06/16/12 241 lb 12 oz (109.657 kg)  02/12/12 227 lb (102.967 kg)  01/08/12 227 lb 4 oz (103.08 kg)   Depression - PHQ9 = 15.  has never been on antidepressant.  Trouble dealing with recent family passings.  Denies anhedonia.  Some trouble staying asleep, appetite up.  Concentration ability well.  Denies depressed mood. Failed hearing exam today.  Denies trouble with hearing. Declines audiology referral - will be more cognisant of how he's hearing. Passes vision screens. Denies falls in last year.  Preventative: Colon cancer screening - colonoscopy 09/2011 - severe diverticulosis, int hemorrhoids, rec rpt 5 yrs [name]) Prostate cancer screening - saw Dr. Vernie [name] - released from his care.   Lab Results  Component Value Date   PSA 11.69* 08/27/2011   PSA 1.00 10/24/2010  flu - 12/2011. Td 2005 zostavax 11/2010 Pneumovax - never received.  To get today. Advanced directive - has daughter as HCPOA.  Medications and allergies reviewed and updated in chart.  Past histories reviewed and updated if relevant as below. Patient Active Problem List  Diagnosis  . LIPOMA  . DIABETES MELLITUS, TYPE II  . HYPERCHOLESTEROLEMIA, PURE  . ANEMIA, IRON DEFICIENCY  . ANXIETY DEPRESSION  . ESSENTIAL HYPERTENSION  . TIA  . GERD  . ABDOMINAL WALL HERNIA  . CALCIFIC TENDINITIS  . BACK PAIN WITH  RADICULOPATHY  . COCAINE ABUSE, HX OF  . Rhinitis  . Routine general medical examination at a health care facility  . Neck pain, chronic  . Shoulder pain  . Insomnia  . Prostatitis  . Male sexual dysfunction  . Diverticulosis  . Hematochezia  . Esophageal dysphagia  . Internal hemorrhoids without mention of complication  . Obesity  . Headache   Past Medical History  Diagnosis Date  . GERD (gastroesophageal reflux disease)   . Hyperlipidemia   . Hypertension     Essential  . Anxiety   . Depression   . Stroke     TIA as no residual deficits?  . T2DM (type 2 diabetes mellitus)   . Esophageal stricture 06/2007    dilation of stricture  . History of epididymitis   . Back pain     with radiculopathy  . Circadian rhythm sleep disorder, irregular sleep-wake type   . Abdominal wall hernia   . History of cocaine use 2004  . Complication of anesthesia     hard to put patient to sleep  . Chest pain, non-cardiac 2008    adenosine myoview low risk.  EF 60%.  normal cath 2008, deemed atypical /noncardiac, ?discogenic  . Diverticulosis 2013    by CT scan  . History of prostatitis 08/2011    ?chronic/simmering, treated with 1 mo cipro  . Chronic neck pain     s/p  ACDF C5-7 2010 ([name])  . Gross hematuria 2013    negative CT, cystoscopy, cytology  . Bladder wall thickening 2013    nl rpt CT scan, normal cytology and UCx, released from urology [name])   Past Surgical History  Procedure Laterality Date  . Tonsillectomy  childhood  . Carpal tunnel release      Right  . Cardiac catheterization  5/04  . Trigger finger release      Right long finger  . Inguinal hernia repair      bilateral, 5 cm. RLQ Abd Lipoma Excision ([name])  . Esophagogastroduodenoscopy  07/17/07    Esoph. Stricture dilated ([name])  . Anterior cervical discectomy  10/28/2008    with fusion ([name])  . Hematoma evacuation  10/29/2008    ([name])  . Direct laryngoscopy  11/02/2008     Extubation under anesthesia ([name])  . Colonoscopy  10/11/2011    severe diverticulosis, int hemorrhoids, rec rpt 5 yrs [name])  . Esophagogastroduodenoscopy  10/11/2011    WNL [name])   History  Substance Use Topics  . Smoking status: Never Smoker   . Smokeless tobacco: Never Used  . Alcohol Use: No     Comment: Occasionally   Family History  Problem Relation Age of Onset  . Diabetes Mother   . Hypertension Mother   . Hyperlipidemia Mother   . Diabetes Father   . Diabetes Sister   . Hypertension Sister   . Hypertension Brother   . Diabetes Brother   . Heart disease Maternal Uncle     Heart failure  . Cancer Paternal Uncle     Prostate  . Cancer Paternal Grandfather     Prostate  . Stroke Neg Hx   . Diabetes Brother   . Hypertension Brother   . Diabetes Brother    No Known Allergies Current Outpatient Prescriptions on File Prior to Visit  Medication Sig Dispense Refill  . aspirin EC 81 MG tablet Take 81 mg by mouth daily.      . baclofen (LIORESAL) 10 MG tablet Take 1 tablet (10 mg total) by mouth at bedtime as needed (Take only if you have muscle spasms.).  30 each  3  . chlordiazePOXIDE (LIBRIUM) 5 MG capsule TAKE ONE CAPSULE BY MOUTH THREE TIMES   DAILY AS NEEDED  30 capsule  0  . CINNAMON PO Take 1 tablet by mouth daily.       . Cranberry 400 MG CAPS Take 1 capsule by mouth 2 (two) times daily.      Marland Kitchen glucose blood (ONE TOUCH TEST STRIPS) test strip Use as instructed bid, 250.02  100 each  12  . lisinopril (PRINIVIL,ZESTRIL) 10 MG tablet Take 0.5 tablets (5 mg total) by mouth daily.      . metFORMIN (GLUCOPHAGE) 850 MG tablet Take 1 tablet (850 mg total) by mouth 2 (two) times daily with a meal.  90 tablet  3  . fluticasone (FLONASE) 50 MCG/ACT nasal spray Place 2 sprays into the nose daily.  16 g  3  . pantoprazole (PROTONIX) 40 MG tablet Take 1 tablet (40 mg total) by mouth daily at 6 (six) AM.  30 tablet  0  . sildenafil (VIAGRA) 50 MG tablet Take 1  tablet (50 mg total) by mouth daily as needed for erectile dysfunction.  10 tablet  0  . Tamsulosin HCl (FLOMAX) 0.4 MG CAPS Take 0.4 mg by mouth daily after supper.       No  current facility-administered medications on file prior to visit.    Review of Systems  Constitutional: Negative for fever, chills, activity change, appetite change, fatigue and unexpected weight change.  HENT: Positive for congestion. Negative for hearing loss and neck pain.   Eyes: Negative for visual disturbance.  Respiratory: Positive for cough. Negative for chest tightness, shortness of breath and wheezing.   Cardiovascular: Negative for chest pain, palpitations and leg swelling.  Gastrointestinal: Negative for nausea, vomiting, abdominal pain, diarrhea, constipation, blood in stool and abdominal distention.  Genitourinary: Negative for hematuria and difficulty urinating.  Musculoskeletal: Negative for myalgias and arthralgias.  Skin: Negative for rash.  Neurological: Negative for dizziness, seizures, syncope and headaches.  Hematological: Does not bruise/bleed easily.  Psychiatric/Behavioral: Positive for dysphoric mood. The patient is not nervous/anxious.        Objective:   Physical Exam  Nursing note and vitals reviewed. Constitutional: He is oriented to person, place, and time. He appears well-developed and well-nourished. No distress.  HENT:  Head: Normocephalic and atraumatic.  Right Ear: Hearing, tympanic membrane, external ear and ear canal normal.  Left Ear: Hearing, tympanic membrane, external ear and ear canal normal.  Nose: Nose normal.  Mouth/Throat: Oropharynx is clear and moist. No oropharyngeal exudate.  Eyes: Conjunctivae and EOM are normal. Pupils are equal, round, and reactive to light. No scleral icterus.  Neck: Normal range of motion. Neck supple. Carotid bruit is not present. No thyromegaly present.  Cardiovascular: Normal rate, regular rhythm, normal heart sounds and intact distal  pulses.   No murmur heard. Pulses:      Radial pulses are 2+ on the right side, and 2+ on the left side.  Pulmonary/Chest: Effort normal and breath sounds normal. No respiratory distress. He has no wheezes. He has no rales.  Abdominal: Soft. Bowel sounds are normal. He exhibits no distension and no mass. There is no tenderness. There is no rebound and no guarding.  Genitourinary: Rectum normal. Rectal exam shows no external hemorrhoid, no internal hemorrhoid, no fissure, no mass, no tenderness and anal tone normal. Prostate is tender. Prostate is not enlarged (20gm).  Tender nodule at base of prostate  Musculoskeletal: Normal range of motion. He exhibits no edema.  Lymphadenopathy:    He has no cervical adenopathy.  Neurological: He is alert and oriented to person, place, and time.  CN grossly intact, station and gait intact  Skin: Skin is warm and dry. No rash noted.  Psychiatric: He has a normal mood and affect. His behavior is normal. Judgment and thought content normal.       Assessment & Plan:

## 2012-06-16 NOTE — Patient Instructions (Addendum)
Try metamucil or benefiber for constipation - soluble fiber supplement mixed in 1 glass of water daily. Pneumonia shot today - pneumovax. For nose - restart flonase (i've sent this in) and nasal saline.  May try antihistamine like claritin/zyrtec over the counter. Blood work today.  We may send you back to Dr. [name] for prostate recheck Return as needed or in 6 months for follow up diabetes. Good to see you today, call us with questions.

## 2012-06-16 NOTE — Assessment & Plan Note (Signed)
Anticipate allergic in nature. Recommended restart flonase, nasal saline and antihistamine.

## 2012-06-16 NOTE — Assessment & Plan Note (Signed)
Prostate nodule in h/o BPH.   Lab Results  Component Value Date   PSA 11.69* 08/27/2011   PSA 1.00 10/24/2010  Reviewed urology notes - on 10/2011, PSA 1.54 and normal prostate exam at that time. recheck PSA today, if elevated will refer back to urology, o/w consider rechecking in 6 months.

## 2012-06-16 NOTE — Assessment & Plan Note (Signed)
Recheck FLP today.  Goal LDL <100 given DM.  May need statin.

## 2012-06-16 NOTE — Assessment & Plan Note (Addendum)
I have personally reviewed the Medicare Annual Wellness questionnaire and have noted 1. The patient's medical and social history 2. Their use of alcohol, tobacco or illicit drugs 3. Their current medications and supplements 4. The patient's functional ability including ADL's, fall risks, home safety risks and hearing or visual impairment. 5. Diet and physical activity 6. Evidence for depression or mood disorders The patients weight, height, BMI have been recorded in the chart.  Hearing and vision has been addressed. I have made referrals, counseling and provided education to the patient based review of the above and I have provided the pt with a written personalized care plan for preventive services. See scanned questionairre. Advanced directives discussed: would want daughter to be HCPOA.  Reviewed preventative protocols and updated unless pt declined. Pneumovax today. O/w UTD. declines formal hearing evaluation currently.

## 2012-06-16 NOTE — Assessment & Plan Note (Signed)
-   check CBC today

## 2012-06-16 NOTE — Assessment & Plan Note (Signed)
Chronic, stable. Great control on lisinopril 5mg  daily.

## 2012-06-16 NOTE — Assessment & Plan Note (Signed)
Chronic, stable. Great control anticipated. Check A1c today. rtc 6 mo for f/u.

## 2012-06-16 NOTE — Addendum Note (Signed)
Addended by: Josph Macho A on: 06/16/2012 09:39 AM   Modules accepted: Orders

## 2012-06-16 NOTE — Assessment & Plan Note (Signed)
H/o this in chart, PHQ9=15 today but on further discussion pt denies significant problem, declines pharmacotherapy or counseling.  Will monitor for now and let me know if sxs deteriorating or overwhelming.

## 2012-06-17 ENCOUNTER — Encounter: Payer: Self-pay | Admitting: *Deleted

## 2012-06-19 ENCOUNTER — Telehealth: Payer: Self-pay | Admitting: Family Medicine

## 2012-06-19 DIAGNOSIS — E7800 Pure hypercholesterolemia, unspecified: Secondary | ICD-10-CM

## 2012-06-19 NOTE — Telephone Encounter (Signed)
Called from [phone] for lab results.    Per Epic message 06/17/12 from Dr Sharen Hones, informed "chol levels remain elevated - would recommend starting chol medication. Kidneys, prostate, blood counts normal.  A1c 7.0% - controlled, needs to monitor sugars closely."    Agreed to MD recommendation for cholesterol medication.  Would like cholesterol medication called to [facility].  Please call back to advise when medication is ordered.

## 2012-06-20 MED ORDER — ATORVASTATIN CALCIUM 10 MG PO TABS: 10.0000 mg | ORAL_TABLET | Freq: Every day | ORAL | Status: DC

## 2012-06-20 NOTE — Telephone Encounter (Signed)
Patient notified

## 2012-06-20 NOTE — Telephone Encounter (Signed)
plz notify chol medicine sent in. Watch for muscle ache on med.

## 2012-06-21 ENCOUNTER — Other Ambulatory Visit: Payer: Self-pay | Admitting: Family Medicine

## 2012-06-25 ENCOUNTER — Telehealth: Payer: Self-pay | Admitting: Family Medicine

## 2012-06-25 MED ORDER — GLUCOSE BLOOD VI STRP: ORAL_STRIP | Status: DC

## 2012-06-25 NOTE — Telephone Encounter (Signed)
I've sent in again. plz check with pharmacy to ensure they received and then notify pt.

## 2012-06-25 NOTE — Telephone Encounter (Signed)
Patient went to pick up his Touch One Scrips and the pharmacy told him they need more information before they can fill his prescription.

## 2012-06-26 NOTE — Telephone Encounter (Signed)
Pharmacy confirmed they received Rx. Patient notified.

## 2012-07-01 ENCOUNTER — Telehealth: Payer: Self-pay | Admitting: Family Medicine

## 2012-07-01 NOTE — Telephone Encounter (Signed)
Noted. Thanks.
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
## 2012-08-16 ENCOUNTER — Emergency Department (HOSPITAL_COMMUNITY)
Admission: EM | Admit: 2012-08-16 | Discharge: 2012-08-16 | Disposition: A | Discharge status: Home / Self Care | Payer: Medicare Other | Attending: Emergency Medicine | Admitting: Emergency Medicine

## 2012-08-16 ENCOUNTER — Encounter (HOSPITAL_COMMUNITY): Payer: Self-pay | Admitting: Physical Medicine and Rehabilitation

## 2012-08-16 DIAGNOSIS — Z79899 Other long term (current) drug therapy: Secondary | ICD-10-CM | POA: Insufficient documentation

## 2012-08-16 DIAGNOSIS — J3489 Other specified disorders of nose and nasal sinuses: Secondary | ICD-10-CM | POA: Insufficient documentation

## 2012-08-16 DIAGNOSIS — R0789 Other chest pain: Secondary | ICD-10-CM | POA: Insufficient documentation

## 2012-08-16 DIAGNOSIS — E785 Hyperlipidemia, unspecified: Secondary | ICD-10-CM | POA: Insufficient documentation

## 2012-08-16 DIAGNOSIS — J029 Acute pharyngitis, unspecified: Secondary | ICD-10-CM | POA: Insufficient documentation

## 2012-08-16 DIAGNOSIS — I10 Essential (primary) hypertension: Secondary | ICD-10-CM | POA: Insufficient documentation

## 2012-08-16 DIAGNOSIS — R0602 Shortness of breath: Secondary | ICD-10-CM | POA: Insufficient documentation

## 2012-08-16 DIAGNOSIS — K219 Gastro-esophageal reflux disease without esophagitis: Secondary | ICD-10-CM | POA: Insufficient documentation

## 2012-08-16 DIAGNOSIS — F411 Generalized anxiety disorder: Secondary | ICD-10-CM | POA: Insufficient documentation

## 2012-08-16 DIAGNOSIS — E119 Type 2 diabetes mellitus without complications: Secondary | ICD-10-CM | POA: Insufficient documentation

## 2012-08-16 DIAGNOSIS — J069 Acute upper respiratory infection, unspecified: Secondary | ICD-10-CM | POA: Insufficient documentation

## 2012-08-16 DIAGNOSIS — F329 Major depressive disorder, single episode, unspecified: Secondary | ICD-10-CM | POA: Insufficient documentation

## 2012-08-16 DIAGNOSIS — Z7982 Long term (current) use of aspirin: Secondary | ICD-10-CM | POA: Insufficient documentation

## 2012-08-16 DIAGNOSIS — Z8673 Personal history of transient ischemic attack (TIA), and cerebral infarction without residual deficits: Secondary | ICD-10-CM | POA: Insufficient documentation

## 2012-08-16 DIAGNOSIS — F1410 Cocaine abuse, uncomplicated: Secondary | ICD-10-CM | POA: Insufficient documentation

## 2012-08-16 DIAGNOSIS — F3289 Other specified depressive episodes: Secondary | ICD-10-CM | POA: Insufficient documentation

## 2012-08-16 MED ORDER — HYDROCOD POLST-CHLORPHEN POLST 10-8 MG/5ML PO LQCR: 5.0000 mL | Freq: Every evening | ORAL | Status: DC | PRN

## 2012-08-16 NOTE — ED Provider Notes (Signed)
History  This chart was scribed for non-physician practitioner working with Flint Melter, MD by Erskine Emery, ED Scribe. This patient was seen in room TR05C/TR05C and the patient's care was started at 16:55.   CSN: [id number]  Arrival date & time 08/16/12  1636   First MD Initiated Contact with Patient 08/16/12 1655      Chief Complaint  Patient presents with  . Nasal Congestion  . Cough    (Consider location/radiation/quality/duration/timing/severity/associated sxs/prior treatment) The history is provided by the patient. No language interpreter was used.  [name] is a 61 y.o. male who presents to the Emergency Department complaining of productive cough, sinus congestion, and sneezing for the past 4 days. Pt reports some associated chest soreness, abdominal pain, rhinorrhea, mild SOB, sore throat, hot flashes, and insomnia. He reports the cough is coming more from his upper respiratory area than his chest. Pt denies any ear pain. Pt has been taking OTC medications (pseudoephedrine and nasal spray, flonase) with no relief. No fevers.   Past Medical History  Diagnosis Date  . GERD (gastroesophageal reflux disease)   . Hyperlipidemia   . Hypertension     Essential  . Anxiety   . Depression   . Stroke     TIA as no residual deficits?  . T2DM (type 2 diabetes mellitus)   . Esophageal stricture 06/2007    dilation of stricture  . History of epididymitis   . Back pain     with radiculopathy  . Circadian rhythm sleep disorder, irregular sleep-wake type   . Abdominal wall hernia   . History of cocaine use 2004  . Complication of anesthesia     hard to put patient to sleep  . Chest pain, non-cardiac 2008    adenosine myoview low risk.  EF 60%.  normal cath 2008, deemed atypical /noncardiac, ?discogenic  . Diverticulosis 2013    by CT scan  . History of prostatitis 08/2011    ?chronic/simmering, treated with 1 mo cipro  . Chronic neck pain     s/p ACDF C5-7 2010  ([facility])  . Gross hematuria 2013    negative CT, cystoscopy, cytology  . Bladder wall thickening 2013    nl rpt CT scan, normal cytology and UCx, released from urology [name])    Past Surgical History  Procedure Laterality Date  . Tonsillectomy  childhood  . Carpal tunnel release      Right  . Cardiac catheterization  5/04  . Trigger finger release      Right long finger  . Inguinal hernia repair      bilateral, 5 cm. RLQ Abd Lipoma Excision (Dr. Ezzard Standing)  . Esophagogastroduodenoscopy  07/17/07    Esoph. Stricture dilated (Dr. Marina Goodell)  . Anterior cervical discectomy  10/28/2008    with fusion (Dr. Shon Baton)  . Hematoma evacuation  10/29/2008    (Dr. Shon Baton)  . Direct laryngoscopy  11/02/2008    Extubation under anesthesia (Dr. Lazarus Salines)  . Colonoscopy  10/11/2011    severe diverticulosis, int hemorrhoids, rec rpt 5 yrs [name])  . Esophagogastroduodenoscopy  10/11/2011    WNL [name])    Family History  Problem Relation Age of Onset  . Diabetes Mother   . Hypertension Mother   . Hyperlipidemia Mother   . Diabetes Father   . Diabetes Sister   . Hypertension Sister   . Hypertension Brother   . Diabetes Brother   . Heart disease Maternal Uncle     Heart failure  .  Cancer Paternal Uncle     Prostate  . Cancer Paternal Grandfather     Prostate  . Stroke Neg Hx   . Diabetes Brother   . Hypertension Brother   . Diabetes Brother     History  Substance Use Topics  . Smoking status: Never Smoker   . Smokeless tobacco: Never Used  . Alcohol Use: No     Comment: Occasionally      Review of Systems  Constitutional: Negative for fever and chills.  HENT: Positive for congestion, sore throat, rhinorrhea and sneezing. Negative for ear pain.   Respiratory: Positive for cough, chest tightness and shortness of breath.   Gastrointestinal: Positive for abdominal pain. Negative for nausea and vomiting.  Neurological: Negative for weakness.  All other systems reviewed and are  negative.    Allergies  Review of patient's allergies indicates no known allergies.  Home Medications   Current Outpatient Rx  Name  Route  Sig  Dispense  Refill  . aspirin EC 81 MG tablet   Oral   Take 81 mg by mouth daily.         Marland Kitchen atorvastatin (LIPITOR) 10 MG tablet   Oral   Take 1 tablet (10 mg total) by mouth daily.   90 tablet   3   . baclofen (LIORESAL) 10 MG tablet   Oral   Take 1 tablet (10 mg total) by mouth at bedtime as needed (Take only if you have muscle spasms.).   30 each   3   . calcium carbonate (TUMS EX) 750 MG chewable tablet   Oral   Chew 1 tablet by mouth as needed for heartburn.         Marland Kitchen CINNAMON PO   Oral   Take 1 tablet by mouth daily.          . Cranberry 400 MG CAPS   Oral   Take 1 capsule by mouth 2 (two) times daily.         . fluticasone (FLONASE) 50 MCG/ACT nasal spray   Nasal   Place 2 sprays into the nose daily.   16 g   11   . glucose blood (ONE TOUCH ULTRA TEST) test strip      TEST BLOOD SUGAR TWICE DAILY AS DIRECTED.  250.02   100 each   11   . lisinopril (PRINIVIL,ZESTRIL) 10 MG tablet   Oral   Take 0.5 tablets (5 mg total) by mouth daily.         . metFORMIN (GLUCOPHAGE) 850 MG tablet   Oral   Take 1 tablet (850 mg total) by mouth 2 (two) times daily with a meal.   90 tablet   3   . pantoprazole (PROTONIX) 40 MG tablet   Oral   Take 40 mg by mouth daily.         . pseudoephedrine (SUDAFED) 30 MG tablet   Oral   Take 60 mg by mouth every 4 (four) hours as needed for congestion.         . Tamsulosin HCl (FLOMAX) 0.4 MG CAPS   Oral   Take 0.4 mg by mouth daily after supper.         Marland Kitchen EXPIRED: sildenafil (VIAGRA) 50 MG tablet   Oral   Take 1 tablet (50 mg total) by mouth daily as needed for erectile dysfunction.   10 tablet   0     Triage Vitals: BP 133/95  Pulse 83  Temp(Src) 97.8 F (  36.6 C) (Oral)  Resp 20  SpO2 97%  Physical Exam  Nursing note and vitals  reviewed. Constitutional: He is oriented to person, place, and time. He appears well-developed and well-nourished. No distress.  HENT:  Head: Normocephalic and atraumatic.  Right Ear: Tympanic membrane, external ear and ear canal normal.  Left Ear: Tympanic membrane, external ear and ear canal normal.  Nose: Nose normal.  Erythema at the back of the throat. No submental edema, trismus, tongue elevation.  Eyes: Conjunctivae are normal.  Neck: Normal range of motion. No tracheal deviation present.  Cardiovascular: Normal rate, regular rhythm and normal heart sounds.   Pulmonary/Chest: Effort normal and breath sounds normal. No stridor. He has no wheezes.  Lungs clear.  Abdominal: Soft. He exhibits no distension. There is no tenderness.  Musculoskeletal: Normal range of motion.  Neurological: He is alert and oriented to person, place, and time.  Skin: Skin is warm and dry. He is not diaphoretic.  Psychiatric: He has a normal mood and affect. His behavior is normal.    ED Course  Procedures (including critical care time) DIAGNOSTIC STUDIES: Oxygen Saturation is 97% on room air, adequate by my interpretation.    COORDINATION OF CARE: 18:05--I evaluated the patient and we discussed a treatment plan including cough medicine to which the pt agreed. I notified the pt that he should not take the cough medication if he is going to drive or be active.   Labs Reviewed  GLUCOSE, CAPILLARY - Abnormal; Notable for the following:    Glucose-Capillary 115 (*)    All other components within normal limits   No results found.   1. Upper respiratory infection       MDM  Patient presents today with 4 days of worsening cough, cold, congestion. He has tried pseudoephedrine with no relief. He has also been taking his Flonase. He is afebrile. Oxygen sats remained at 97% on room air ED course. Lungs were clear to auscultation. No concern for a pneumonia. At this point symptoms consistent with viral  causes. Discussed this with the patient. He was given cough medicine and will followup with his PCP. Strict return injections given. Vital signs stable for discharge. Patient / Family / Caregiver informed of clinical course, understand medical decision-making process, and agree with plan.       I personally performed the services described in this documentation, which was scribed in my presence. The recorded information has been reviewed and is accurate.    Mora Bellman, PA-C 08/17/12 [phone]

## 2012-08-16 NOTE — ED Notes (Signed)
Pt was standing at room doorway reports, "Feels like my sugar is about to drop." CBG checked 115. PA made aware ok for pt have something to drink. Pt given Diet Coke.

## 2012-08-16 NOTE — ED Notes (Signed)
Pt presents to department for evaluation of coughing, sneezing, and sinus congestion. Ongoing x4 days. No relief with OTC medications. Respirations unlabored. Pt is alert and oriented x4.

## 2012-08-17 NOTE — ED Provider Notes (Signed)
Medical screening examination/treatment/procedure(s) were performed by non-physician practitioner and as supervising physician I was immediately available for consultation/collaboration.  Mishti Swanton L Shatora Weatherbee, MD 08/17/12 0225 

## 2012-08-18 ENCOUNTER — Ambulatory Visit (INDEPENDENT_AMBULATORY_CARE_PROVIDER_SITE_OTHER): Payer: Medicare Other | Admitting: Family Medicine

## 2012-08-18 ENCOUNTER — Encounter: Payer: Self-pay | Admitting: Family Medicine

## 2012-08-18 VITALS — BP 132/84 | HR 111 | Temp 97.4°F | Ht 67.0 in | Wt 231.5 lb

## 2012-08-18 DIAGNOSIS — J069 Acute upper respiratory infection, unspecified: Secondary | ICD-10-CM

## 2012-08-18 MED ORDER — BENZONATATE 200 MG PO CAPS: 200.0000 mg | ORAL_CAPSULE | Freq: Two times a day (BID) | ORAL | Status: DC | PRN

## 2012-08-18 MED ORDER — BACLOFEN 10 MG PO TABS: 10.0000 mg | ORAL_TABLET | Freq: Every evening | ORAL | Status: DC | PRN

## 2012-08-18 MED ORDER — HYDROCODONE-HOMATROPINE 5-1.5 MG/5ML PO SYRP: ORAL_SOLUTION | ORAL | Status: DC

## 2012-08-18 NOTE — Progress Notes (Signed)
Nature conservation officer at [facility] [address] [place] [address] Phone: [phone] Fax: [phone]  Date:  08/18/2012   Name:  [name]   DOB:  27-Sep-1950   MRN:  [id number] Gender: male Age: 62 y.o.  Primary Physician:  Eustaquio Boyden, MD  Evaluating MD: Hannah Beat, MD   Chief Complaint: Follow-up   History of Present Illness:  [name] is a 62 y.o. pleasant patient who presents with the following:  Going on more than a week, coughing, sneezing and blowing nose and stomach is bothering him. Last night, caught himself gagging. So much blowing and sneezing. Very pleasant patient. He was seen in the emergency room on August 16, 2012, and they given some Tussionex, which was not covered by his insurance.  He is generally feeling pretty bad, but he continues to be afebrile.  Patient Active Problem List   Diagnosis Date Noted  . Prostate nodule 06/16/2012  . Headache 02/12/2012  . Obesity 01/08/2012  . Esophageal dysphagia 10/10/2011  . Diverticulosis 10/09/2011  . 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 Insomnia 06/19/2011  . Shoulder pain 04/08/2011  . Neck pain, chronic 02/24/2011  . Rhinitis 10/26/2010  . Medicare annual wellness visit, initial 10/26/2010  . ANEMIA, IRON DEFICIENCY 07/01/2009  . GERD 09/29/2008  . CALCIFIC TENDINITIS 04/26/2008  . ESSENTIAL HYPERTENSION 01/01/2008  . ANXIETY DEPRESSION 03/19/2007  . TIA 03/05/2007  . BACK PAIN WITH RADICULOPATHY 12/31/2006  . ABDOMINAL WALL HERNIA 10/03/2006  . Diabetes type 2, uncontrolled 10/01/2006  . COCAINE ABUSE, HX OF 10/01/2006  . HYPERCHOLESTEROLEMIA, PURE 08/21/2005    Past Medical History  Diagnosis Date  . GERD (gastroesophageal reflux disease)   . Hyperlipidemia   . Hypertension     Essential  . Anxiety   . Depression   . Stroke     TIA as no residual deficits?  . T2DM (type 2 diabetes mellitus)   . Esophageal stricture 06/2007    dilation of stricture  .  History of epididymitis   . Back pain     with radiculopathy  . Circadian rhythm sleep disorder, irregular sleep-wake type   . Abdominal wall hernia   . History of cocaine use 2004  . Complication of anesthesia     hard to put patient to sleep  . Chest pain, non-cardiac 2008    adenosine myoview low risk.  EF 60%.  normal cath 2008, deemed atypical /noncardiac, ?discogenic  . Diverticulosis 2013    by CT scan  . History of prostatitis 08/2011    ?chronic/simmering, treated with 1 mo cipro  . Chronic neck pain     s/p ACDF C5-7 2010 ([facility])  . Gross hematuria 2013    negative CT, cystoscopy, cytology  . Bladder wall thickening 2013    nl rpt CT scan, normal cytology and UCx, released from urology [name])    Past Surgical History  Procedure Laterality Date  . Tonsillectomy  childhood  . Carpal tunnel release      Right  . Cardiac catheterization  5/04  . Trigger finger release      Right long finger  . Inguinal hernia repair      bilateral, 5 cm. RLQ Abd Lipoma Excision (Dr. Ezzard Standing)  . Esophagogastroduodenoscopy  07/17/07    Esoph. Stricture dilated (Dr. Marina Goodell)  . Anterior cervical discectomy  10/28/2008    with fusion (Dr. Shon Baton)  . Hematoma evacuation  10/29/2008    (Dr. Shon Baton)  . Direct laryngoscopy  11/02/2008    Extubation under anesthesia (Dr. Lazarus Salines)  . Colonoscopy  10/11/2011    severe diverticulosis, int hemorrhoids, rec rpt 5 yrs [name])  . Esophagogastroduodenoscopy  10/11/2011    WNL [name])    History   Social History  . Marital Status: Single    Spouse Name: N/A    Number of Children: N/A  . Years of Education: N/A   Occupational History  . [name] previously     Now on disability after neck surgery   Social History Main Topics  . Smoking status: Never Smoker   . Smokeless tobacco: Never Used  . Alcohol Use: No     Comment: Occasionally  . Drug Use: No     Comment: H/O marijuana (last 2005); Cocaine 5/04  . Sexually Active:  No   Other Topics Concern  . 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    Family History  Problem Relation Age of Onset  . Diabetes Mother   . Hypertension Mother   . Hyperlipidemia Mother   . Diabetes Father   . Diabetes Sister   . Hypertension Sister   . Hypertension Brother   . Diabetes Brother   . Heart disease Maternal Uncle     Heart failure  . Cancer Paternal Uncle     Prostate  . Cancer Paternal Grandfather     Prostate  . Stroke Neg Hx   . Diabetes Brother   . Hypertension Brother   . Diabetes Brother     No Known Allergies  Medication list has been reviewed and updated.  Outpatient Prescriptions Prior to Visit  Medication Sig Dispense Refill  . aspirin EC 81 MG tablet Take 81 mg by mouth daily.      Marland Kitchen atorvastatin (LIPITOR) 10 MG tablet Take 1 tablet (10 mg total) by mouth daily.  90 tablet  3  . baclofen (LIORESAL) 10 MG tablet Take 1 tablet (10 mg total) by mouth at bedtime as needed (Take only if you have muscle spasms.).  30 each  3  . calcium carbonate (TUMS EX) 750 MG chewable tablet Chew 1 tablet by mouth as needed for heartburn.      Marland Kitchen CINNAMON PO Take 1 tablet by mouth daily.       . Cranberry 400 MG CAPS Take 1 capsule by mouth 2 (two) times daily.      . fluticasone (FLONASE) 50 MCG/ACT nasal spray Place 2 sprays into the nose daily.  16 g  11  . glucose blood (ONE TOUCH ULTRA TEST) test strip TEST BLOOD SUGAR TWICE DAILY AS DIRECTED.  250.02  100 each  11  . lisinopril (PRINIVIL,ZESTRIL) 10 MG tablet Take 0.5 tablets (5 mg total) by mouth daily.      . metFORMIN (GLUCOPHAGE) 850 MG tablet Take 1 tablet (850 mg total) by mouth 2 (two) times daily with a meal.  90 tablet  3  . pantoprazole (PROTONIX) 40 MG tablet Take 40 mg by mouth daily.      . pseudoephedrine (SUDAFED) 30 MG tablet Take 60  mg by mouth every 4 (four) hours as needed for congestion.      . Tamsulosin HCl (FLOMAX) 0.4 MG CAPS Take 0.4 mg by mouth daily after supper.      . chlorpheniramine-HYDROcodone (TUSSIONEX PENNKINETIC ER) 10-8 MG/5ML [facility]  Take 5 mLs by mouth at bedtime as needed.  115 mL  0  . sildenafil (VIAGRA) 50 MG tablet Take 1 tablet (50 mg total) by mouth daily as needed for erectile dysfunction.  10 tablet  0   No facility-administered medications prior to visit.    Review of Systems:  ROS: GEN: Acute illness details above GI: Tolerating PO intake GU: maintaining adequate hydration and urination Pulm: No SOB Interactive and getting along well at home.  Otherwise, ROS is as per the HPI.   Physical Examination: BP 132/84  Pulse 111  Temp(Src) 97.4 F (36.3 C) (Oral)  Ht 5\' 7"  (1.702 m)  Wt 231 lb 8 oz (105.008 kg)  BMI 36.25 kg/m2  SpO2 98%  Ideal Body Weight: Weight in (lb) to have BMI = 25: 159.3   Gen: WDWN, NAD; A & O x3, cooperative. Pleasant.Globally Non-toxic HEENT: Normocephalic and atraumatic. Throat clear, w/o exudate, R TM clear, L TM - good landmarks, No fluid present. rhinnorhea.  MMM Frontal sinuses: NT Max sinuses: NT NECK: Anterior cervical  LAD is absent CV: RRR, No M/G/R, cap refill <2 sec PULM: Breathing comfortably in no respiratory distress. no wheezing, crackles, rhonchi EXT: No c/c/e PSYCH: Friendly, good eye contact MSK: Nml gait    Assessment and Plan:  URI (upper respiratory infection)  Given cough medications for symptomatic relief, and also refilled his baclofen.  Orders Today:  No orders of the defined types were placed in this encounter.    Updated Medication List: (Includes new medications, updates to list, dose adjustments) Meds ordered this encounter  Medications  . HYDROcodone-homatropine (HYCODAN) 5-1.5 MG/5ML syrup    Sig: 1 tsp po at night before bed prn cough    Dispense:  240 mL    Refill:  0  . benzonatate (TESSALON) 200 MG  capsule    Sig: Take 1 capsule (200 mg total) by mouth 2 (two) times daily as needed for cough.    Dispense:  40 capsule    Refill:  0  . baclofen (LIORESAL) 10 MG tablet    Sig: Take 1 tablet (10 mg total) by mouth at bedtime as needed (Take only if you have muscle spasms.).    Dispense:  30 each    Refill:  3    Medications Discontinued: Medications Discontinued During This Encounter  Medication Reason  . chlorpheniramine-HYDROcodone (TUSSIONEX PENNKINETIC ER) 10-8 MG/5ML LQCR   . baclofen (LIORESAL) 10 MG tablet Reorder      Signed, Karleen Hampshire T. Dillin Lofgren, MD 08/18/2012 12:46 PM

## 2012-09-26 ENCOUNTER — Other Ambulatory Visit: Payer: Self-pay | Admitting: Family Medicine

## 2012-10-01 ENCOUNTER — Telehealth: Payer: Self-pay

## 2012-10-01 ENCOUNTER — Encounter: Payer: Self-pay | Admitting: Family Medicine

## 2012-10-01 ENCOUNTER — Ambulatory Visit (INDEPENDENT_AMBULATORY_CARE_PROVIDER_SITE_OTHER): Payer: Medicare Other | Admitting: Family Medicine

## 2012-10-01 VITALS — BP 140/78 | HR 84 | Temp 99.1°F | Wt 225.5 lb

## 2012-10-01 DIAGNOSIS — M940 Chondrocostal junction syndrome [Tietze]: Secondary | ICD-10-CM | POA: Insufficient documentation

## 2012-10-01 DIAGNOSIS — R21 Rash and other nonspecific skin eruption: Secondary | ICD-10-CM

## 2012-10-01 MED ORDER — CLOTRIMAZOLE 1 % EX CREA: TOPICAL_CREAM | Freq: Two times a day (BID) | CUTANEOUS | Status: DC

## 2012-10-01 NOTE — Patient Instructions (Addendum)
For chest - you have costochondritis.  May use ibuprofen 600mg  with food when bothering you.  Ice chest wall.  Handout on costochondritis provided. For groin rash - I think this was reaction to menthol in medicated powder - I'm glad you stopped it.  May continue vaselin for next few days then just keep area clean and dry. For foot rash - I think this is some athlete's foot contributing to itching and rash/pain.  Use clotrimazole cream over the counter (and I've sent to pharmacy), then may use vaseline on top of this.  Costochondritis Costochondritis (Tietze syndrome), or costochondral separation, is a swelling and irritation (inflammation) of the tissue (cartilage) that connects your ribs with your breastbone (sternum). It may occur on its own (spontaneously), through damage caused by an accident (trauma), or simply from coughing or minor exercise. It may take up to 6 weeks to get better and longer if you are unable to be conservative in your activities. HOME CARE INSTRUCTIONS   Avoid exhausting physical activity. Try not to strain your ribs during normal activity. This would include any activities using chest, belly (abdominal), and side muscles, especially if heavy weights are used.  Use ice for 15-20 minutes per hour while awake for the first 2 days. Place the ice in a plastic bag, and place a towel between the bag of ice and your skin.  Only take over-the-counter or prescription medicines for pain, discomfort, or fever as directed by your caregiver. SEEK IMMEDIATE MEDICAL CARE IF:   Your pain increases or you are very uncomfortable.  You have a fever.  You develop difficulty with your breathing.  You cough up blood.  You develop worse chest pains, shortness of breath, sweating, or vomiting.  You develop new, unexplained problems (symptoms). MAKE SURE YOU:   Understand these instructions.  Will watch your condition.  Will get help right away if you are not doing well or get  worse. Document Released: 01/17/2005 Document Revised: 07/02/2011 Document Reviewed: 11/26/2007 [facility] Patient Information 2014 [place], [place].

## 2012-10-01 NOTE — Assessment & Plan Note (Signed)
Groin rash - anticipate allergic or irritant reaction to menthol medicated powder he had been using - I reassured him that rash should heal well with time.  Will add menthol to allergy list. Foot rash - ?beginning of tinea pedis.  Will treat with clotrimazole cream for next 3-4 wks, update if persistent rash or itch.

## 2012-10-01 NOTE — Assessment & Plan Note (Signed)
Not consistent with cardiac cause of chest pain - and has had normal cardiac workup in past. Anticipate due to costochondritis - treat with ibuprofen 600mg  bid with food, ice/heat.  Provided with costochondiritis handout today.

## 2012-10-01 NOTE — Telephone Encounter (Signed)
Amy called from [facility]; pt thought was going to get med for chest when picked up the Lotrimen cream.Please advise. (Saw in note where pt was to take ibuprofen 600 mg for chest; is pt to take OTC ibuprofen 200 mg taking 3 tabs or is ibuprofen 600 mg to be sent to [facility]. Amy request call back.

## 2012-10-01 NOTE — Progress Notes (Signed)
Subjective:    Patient ID: [name], male    DOB: 03/22/1951, 62 y.o.   MRN: [id number]  HPI CC: chest pain, rash on feet and groin  Chest pain - notes sharp chest pains with movement for last 2 weeks.  Has had this in past.  Points to mid chest.  Worse with movement.  Stopping and calming down improves pain.  Hasn't tried anything for this in past.  H/o costochondritis in past.  Not reproducible with palpation.  Wonders if coming from neck.  H/o cardiac catheterizations x2 in past, latest 2008 WNL. S/p 2 shoulder injections 06/2012 and s/p cervical ESI 09/2012.  Foot pain with rash/itch - R>L rash.  Notices sores on feet, occasionally leg swells.  Having shooting pain up legs.  Starts as bumps then break skin.  So far has tried nothing other than vaseline.  Groin rash - present for last 3 weeks, presented after started rexall powder (medicated powder with zinc and menthol).  Turning red, peeling skin.  No rash prior to rexall powder.   Past Medical History  Diagnosis Date  . GERD (gastroesophageal reflux disease)   . Hyperlipidemia   . Hypertension     Essential  . Anxiety   . Depression   . Stroke     TIA as no residual deficits?  . T2DM (type 2 diabetes mellitus)   . Esophageal stricture 06/2007    dilation of stricture  . History of epididymitis   . Back pain     with radiculopathy  . Circadian rhythm sleep disorder, irregular sleep-wake type   . Abdominal wall hernia   . History of cocaine use 2004  . Complication of anesthesia     hard to put patient to sleep  . Chest pain, non-cardiac 2008    adenosine myoview low risk.  EF 60%.  normal cath 2008, deemed atypical /noncardiac, ?discogenic  . Diverticulosis 2013    by CT scan  . History of prostatitis 08/2011    ?chronic/simmering, treated with 1 mo cipro  . Chronic neck pain     s/p ACDF C5-7 2010 ([facility])  . Gross hematuria 2013    negative CT, cystoscopy, cytology  . Bladder wall thickening 2013    nl rpt  CT scan, normal cytology and UCx, released from urology [name])    Past Surgical History  Procedure Laterality Date  . Tonsillectomy  childhood  . Carpal tunnel release      Right  . Cardiac catheterization  5/04  . Trigger finger release      Right long finger  . Inguinal hernia repair      bilateral, 5 cm. RLQ Abd Lipoma Excision (Dr. Ezzard Standing)  . Esophagogastroduodenoscopy  07/17/07    Esoph. Stricture dilated (Dr. Marina Goodell)  . Anterior cervical discectomy  10/28/2008    with fusion (Dr. Shon Baton)  . Hematoma evacuation  10/29/2008    (Dr. Shon Baton)  . Direct laryngoscopy  11/02/2008    Extubation under anesthesia (Dr. Lazarus Salines)  . Colonoscopy  10/11/2011    severe diverticulosis, int hemorrhoids, rec rpt 5 yrs [name])  . Esophagogastroduodenoscopy  10/11/2011    WNL [name])     Review of Systems Per HPI    Objective:   Physical Exam  Nursing note and vitals reviewed. Constitutional: He appears well-developed and well-nourished. No distress.  HENT:  Head: Normocephalic and atraumatic.  Mouth/Throat: Oropharynx is clear and moist. No oropharyngeal exudate.  Eyes: Conjunctivae and EOM are normal. Pupils are equal, round,  and reactive to light.  Cardiovascular: Normal rate, regular rhythm, normal heart sounds and intact distal pulses.   No murmur heard. Pulmonary/Chest: Effort normal and breath sounds normal. No respiratory distress. He has no wheezes. He has no rales. He exhibits tenderness (tender to palpation L 2nd/3rd costochondral junction).  Musculoskeletal: He exhibits no edema.  Skin: Skin is warm and dry. Rash noted.  Groin - resolving moist white rash in skin folds of groin, mild erythema of scrotum. Feet - bilateral feet somewhat scaly on soles, with hyperpigmented macules on dorsal feet and lower legs at old lesions.  No interdigit maceration or breakdown.       Assessment & Plan:

## 2012-10-01 NOTE — Telephone Encounter (Signed)
Recommend he only take 200mg  3 pills at a time as needed (for short course at a time).  Take with food.  Lab Results  Component Value Date   CREATININE 1.0 06/16/2012

## 2012-10-02 NOTE — Telephone Encounter (Signed)
If he has trouble with this we could try different prescription strength NSAID

## 2012-10-02 NOTE — Telephone Encounter (Signed)
Advised patient ok the take the ibuprofen as instructed below.  He says that sometimes when he takes ibuprofen or tylenol it seems to speed up his heart rate.  He says he will try taking it and I advised him to call back if he has any problems with it.

## 2012-10-03 ENCOUNTER — Telehealth: Payer: Self-pay | Admitting: *Deleted

## 2012-10-03 NOTE — Telephone Encounter (Signed)
Patient called and said that his skin is still sloughing off and the area is still very painful and seems to be turning into a sore. He was wondering what he should do at this point.

## 2012-10-03 NOTE — Telephone Encounter (Signed)
plz clarify that he's talking about groin rash. If so, I recommend stopping vaseline and keeping area clean and dry.  Wash with mild soapy water then pat dry, don't scrub.  To notify us if this doesn't improve rash.  Will take time to heal.

## 2012-10-03 NOTE — Telephone Encounter (Signed)
Patient notified to stop vaseline and advised to try Dove or [place] soap and not to scrub area with washcloth or towel. Advised to keep area as dry as possible and try to avoid chaffing and sweating and call me back Monday if no improvement. He verbalized understanding.

## 2012-10-06 NOTE — Telephone Encounter (Addendum)
Pt left v/m; still burning and itching in groin area, spreading,  Affected area getting wider and skin is peeling off. Pt has been using [place] soap but area worsening. Pt request cb. (pt wanted Dr Reece Agar to know that he has contacted a lawyer about the [place] powder). [place].

## 2012-10-07 NOTE — Telephone Encounter (Signed)
Stop ivory soap. Use dove fragrance free soap to wash area. I didn't see any peeling on skin when evaluated last week. If worsening, recommend return for office visit.

## 2012-10-07 NOTE — Telephone Encounter (Signed)
Patient advised to stop [place] soap and use fragrance free Dove. Appt scheduled for tomorrow.

## 2012-10-08 ENCOUNTER — Encounter: Payer: Self-pay | Admitting: Family Medicine

## 2012-10-08 ENCOUNTER — Ambulatory Visit (INDEPENDENT_AMBULATORY_CARE_PROVIDER_SITE_OTHER): Payer: Medicare Other | Admitting: Family Medicine

## 2012-10-08 VITALS — BP 124/82 | HR 84 | Temp 98.4°F | Wt 229.5 lb

## 2012-10-08 DIAGNOSIS — L304 Erythema intertrigo: Secondary | ICD-10-CM

## 2012-10-08 DIAGNOSIS — L538 Other specified erythematous conditions: Secondary | ICD-10-CM

## 2012-10-08 DIAGNOSIS — B356 Tinea cruris: Secondary | ICD-10-CM | POA: Insufficient documentation

## 2012-10-08 NOTE — Patient Instructions (Addendum)
Let's use clotrimazole cream to groin - I think you have jock itch infection of groin, possibly exacerbated by menthol powder. Use twice a day.  May continue cool compresses if it helps.  Avoid scrubbing, just pat dry area.

## 2012-10-08 NOTE — Progress Notes (Signed)
  Subjective:    Patient ID: [name], male    DOB: [birth year], 62 y.o.   MRN: [id number]  HPI CC: recheck groin rash  Seen here 10/01/2012 with 3 wk h/o groin rash that started after he used rexall powder (medicated powder with zinc and menthol) on groin.  At last visit, had slightly macerated moist rash in groin.  Advised use vaseline ointment and then dove fragrance free soap.  States rash is worsened, spreading - now peeling rash, with burning sensation.  Has been rubbing skin dry with rag.  Also using wet towel between legs.  Washing with cold water seems to help. Actually feels better today.  Past Medical History  Diagnosis Date  . GERD (gastroesophageal reflux disease)   . Hyperlipidemia   . Hypertension     Essential  . Anxiety   . Depression   . Stroke     TIA as no residual deficits?  . T2DM (type 2 diabetes mellitus)   . Esophageal stricture 06/2007    dilation of stricture  . History of epididymitis   . Back pain     with radiculopathy  . Circadian rhythm sleep disorder, irregular sleep-wake type   . Abdominal wall hernia   . History of cocaine use 2004  . Complication of anesthesia     hard to put patient to sleep  . Chest pain, non-cardiac 2008    adenosine myoview low risk.  EF 60%.  normal cath 2008, deemed atypical /noncardiac, ?discogenic  . Diverticulosis 2013    by CT scan  . History of prostatitis 08/2011    ?chronic/simmering, treated with 1 mo cipro  . Chronic neck pain     s/p ACDF C5-7 2010 ([facility])  . Gross hematuria 2013    negative CT, cystoscopy, cytology  . Bladder wall thickening 2013    nl rpt CT scan, normal cytology and UCx, released from urology [name])      Review of Systems Per HPI    Objective:   Physical Exam  Nursing note and vitals reviewed. Constitutional: He appears well-developed and well-nourished. No distress.  Skin: Skin is warm and dry. Rash noted.     White macular rash in skin folds of groin, some  satellite lesions. No skin breakdown or splitting noted today. Scrotum back to normal - no longer erythematous.       Assessment & Plan:

## 2012-10-08 NOTE — Assessment & Plan Note (Addendum)
Pt has tinea cruris - doubt menthol cream caused this, but likely did exacerbate skin irritation.  Discussed this with patient. Treat with clotrimazole cream he has at home (currently using for tinea pedis). Supportive care discussed. Should continue to heal well.  [initially advised pt he had intertrigo, but sxs more consistent with jock itch]

## 2012-10-10 ENCOUNTER — Other Ambulatory Visit: Payer: Self-pay | Admitting: Family Medicine

## 2012-10-10 ENCOUNTER — Other Ambulatory Visit: Payer: Self-pay

## 2012-10-10 ENCOUNTER — Encounter (HOSPITAL_COMMUNITY): Payer: Self-pay | Admitting: *Deleted

## 2012-10-10 ENCOUNTER — Emergency Department (HOSPITAL_COMMUNITY)
Admission: EM | Admit: 2012-10-10 | Discharge: 2012-10-10 | Disposition: A | Discharge status: Home / Self Care | Payer: Medicare Other | Attending: Emergency Medicine | Admitting: Emergency Medicine

## 2012-10-10 DIAGNOSIS — Z8673 Personal history of transient ischemic attack (TIA), and cerebral infarction without residual deficits: Secondary | ICD-10-CM | POA: Insufficient documentation

## 2012-10-10 DIAGNOSIS — Z79899 Other long term (current) drug therapy: Secondary | ICD-10-CM | POA: Insufficient documentation

## 2012-10-10 DIAGNOSIS — Z8719 Personal history of other diseases of the digestive system: Secondary | ICD-10-CM | POA: Insufficient documentation

## 2012-10-10 DIAGNOSIS — Z981 Arthrodesis status: Secondary | ICD-10-CM | POA: Insufficient documentation

## 2012-10-10 DIAGNOSIS — Z8659 Personal history of other mental and behavioral disorders: Secondary | ICD-10-CM | POA: Insufficient documentation

## 2012-10-10 DIAGNOSIS — E785 Hyperlipidemia, unspecified: Secondary | ICD-10-CM | POA: Insufficient documentation

## 2012-10-10 DIAGNOSIS — R51 Headache: Secondary | ICD-10-CM | POA: Insufficient documentation

## 2012-10-10 DIAGNOSIS — E119 Type 2 diabetes mellitus without complications: Secondary | ICD-10-CM | POA: Insufficient documentation

## 2012-10-10 DIAGNOSIS — Z8679 Personal history of other diseases of the circulatory system: Secondary | ICD-10-CM | POA: Insufficient documentation

## 2012-10-10 DIAGNOSIS — Z7982 Long term (current) use of aspirin: Secondary | ICD-10-CM | POA: Insufficient documentation

## 2012-10-10 DIAGNOSIS — G8929 Other chronic pain: Secondary | ICD-10-CM | POA: Insufficient documentation

## 2012-10-10 DIAGNOSIS — Z9889 Other specified postprocedural states: Secondary | ICD-10-CM | POA: Insufficient documentation

## 2012-10-10 DIAGNOSIS — M542 Cervicalgia: Secondary | ICD-10-CM

## 2012-10-10 DIAGNOSIS — Z87448 Personal history of other diseases of urinary system: Secondary | ICD-10-CM | POA: Insufficient documentation

## 2012-10-10 DIAGNOSIS — K219 Gastro-esophageal reflux disease without esophagitis: Secondary | ICD-10-CM | POA: Insufficient documentation

## 2012-10-10 DIAGNOSIS — IMO0002 Reserved for concepts with insufficient information to code with codable children: Secondary | ICD-10-CM | POA: Insufficient documentation

## 2012-10-10 MED ORDER — LISINOPRIL 10 MG PO TABS: 5.0000 mg | ORAL_TABLET | Freq: Every day | ORAL | Status: DC

## 2012-10-10 MED ORDER — DIAZEPAM 5 MG PO TABS: ORAL_TABLET | ORAL | Status: DC

## 2012-10-10 MED ORDER — OXYCODONE-ACETAMINOPHEN 5-325 MG PO TABS: ORAL_TABLET | ORAL | Status: DC

## 2012-10-10 MED ORDER — OXYCODONE-ACETAMINOPHEN 5-325 MG PO TABS
2.0000 | ORAL_TABLET | Freq: Once | ORAL | Status: AC
  Administered 2012-10-10: 2 via ORAL
  Filled 2012-10-10: qty 2

## 2012-10-10 NOTE — Telephone Encounter (Signed)
Pt request refill lisinopril 10 mg to [facility]; pt said can't remember if cardiologist fills or Dr Reece Agar; but request Dr Reece Agar to fill. Pt was out of town and left oxycodone 10-325 mg. Pt request oxycodone rx; pt said Dr Ethelene Hal usually writes oxycodone. Advised pt he should contact Dr Ethelene Hal for oxycodone rx. Pt voiced understanding.

## 2012-10-10 NOTE — Telephone Encounter (Signed)
Sent in

## 2012-10-10 NOTE — Telephone Encounter (Signed)
Pt left v/m requesting call back; left v/m for pt to call back.

## 2012-10-10 NOTE — ED Notes (Signed)
The pt is c/o neck pain and a jheadache from that.  His pain med ran out 2 days ago.  He did not call his doctor

## 2012-10-10 NOTE — ED Provider Notes (Signed)
History  This chart was scribed for Carleene Cooper III, MD by Ardelia Mems, ED Scribe. This patient was seen in room TR08C/TR08C and the patient's care was started at 9:17 PM.   CSN: [id number]  Arrival date & time 10/10/12  2106     Chief Complaint  Patient presents with  . Neck Pain     The history is provided by the patient. No language interpreter was used.    HPI Comments: [name] is a 62 y.o. male with a hx of chronic neck pain, HTN and DM2 who presents to the Emergency Department complaining of 2 days of gradually worsening, constant, severe neck pain with associated constant, moderate generalized headache onset 2 days ago when pt ran out of his prescribed Oxycocone. Pt had an anterior cervical discectomy with fusion performed for C5-C7 in 2010 by Dr. Shon Baton and reports having chronic neck pain since. Pt states that he has been on Oxycodone for 10 years, and has most recently been taking Oxycodone 10-325's about every 4 hours, as managed by Dr. Ethelene Hal. Pt states that he ran out of his most recent bottle of Oxycodone because of elevated pain which made him take his Oxycodone more regularly. Pt states that he is going to see Dr. Ethelene Hal 10/15/12. Pt is requesting pain medication that will help manage his pain for the next 5 days. Pt states that he has also been prescribed baclofen without relief of pain. Pt denies any other pain or symptoms at this time.   Pt does not have a hx of taking his pain medication outside of prescribed guidelines. Pt does not have a hx of seeking pain medication in the ED setting.   PCP- Dr. Eustaquio Boyden Dr. Ethelene Hal manages pain with Oxycodone  Past Medical History  Diagnosis Date  . GERD (gastroesophageal reflux disease)   . Hyperlipidemia   . Hypertension     Essential  . Anxiety   . Depression   . Stroke     TIA as no residual deficits?  . T2DM (type 2 diabetes mellitus)   . Esophageal stricture 06/2007    dilation of stricture  .  History of epididymitis   . Back pain     with radiculopathy  . Circadian rhythm sleep disorder, irregular sleep-wake type   . Abdominal wall hernia   . History of cocaine use 2004  . Complication of anesthesia     hard to put patient to sleep  . Chest pain, non-cardiac 2008    adenosine myoview low risk.  EF 60%.  normal cath 2008, deemed atypical /noncardiac, ?discogenic  . Diverticulosis 2013    by CT scan  . History of prostatitis 08/2011    ?chronic/simmering, treated with 1 mo cipro  . Chronic neck pain     s/p ACDF C5-7 2010 ([facility])  . Gross hematuria 2013    negative CT, cystoscopy, cytology  . Bladder wall thickening 2013    nl rpt CT scan, normal cytology and UCx, released from urology [name])    Past Surgical History  Procedure Laterality Date  . Tonsillectomy  childhood  . Carpal tunnel release      Right  . Cardiac catheterization  2008    WNL   . Trigger finger release      Right long finger  . Inguinal hernia repair      bilateral, 5 cm. RLQ Abd Lipoma Excision (Dr. Ezzard Standing)  . Esophagogastroduodenoscopy  07/17/07    Esoph. Stricture dilated (Dr. Marina Goodell)  .  Anterior cervical discectomy  10/28/2008    with fusion (Dr. Shon Baton)  . Hematoma evacuation  10/29/2008    (Dr. Shon Baton)  . Direct laryngoscopy  11/02/2008    Extubation under anesthesia (Dr. Lazarus Salines)  . Colonoscopy  10/11/2011    severe diverticulosis, int hemorrhoids, rec rpt 5 yrs [name])  . Esophagogastroduodenoscopy  10/11/2011    WNL [name])    Family History  Problem Relation Age of Onset  . Diabetes Mother   . Hypertension Mother   . Hyperlipidemia Mother   . Diabetes Father   . Diabetes Sister   . Hypertension Sister   . Hypertension Brother   . Diabetes Brother   . Heart disease Maternal Uncle     Heart failure  . Cancer Paternal Uncle     Prostate  . Cancer Paternal Grandfather     Prostate  . Stroke Neg Hx   . Diabetes Brother   . Hypertension Brother   . Diabetes Brother      History  Substance Use Topics  . Smoking status: Never Smoker   . Smokeless tobacco: Never Used  . Alcohol Use: No     Comment: Occasionally      Review of Systems  Constitutional: Negative for fever and diaphoresis.  HENT: Positive for neck pain. Negative for neck stiffness.   Eyes: Negative for visual disturbance.  Respiratory: Negative for apnea, chest tightness and shortness of breath.   Cardiovascular: Negative for chest pain and palpitations.  Gastrointestinal: Negative for nausea, vomiting, diarrhea and constipation.  Genitourinary: Negative for dysuria.  Musculoskeletal: Negative for gait problem.  Skin: Negative for rash.  Neurological: Positive for headaches. Negative for dizziness, weakness, light-headedness and numbness.   A complete 10 system review of systems was obtained and all systems are negative except as noted in the HPI and PMH.   Allergies  Menthol (topical analgesic)  Home Medications   Current Outpatient Rx  Name  Route  Sig  Dispense  Refill  . aspirin EC 81 MG tablet   Oral   Take 81 mg by mouth daily.         Marland Kitchen atorvastatin (LIPITOR) 10 MG tablet   Oral   Take 1 tablet (10 mg total) by mouth daily.   90 tablet   3   . baclofen (LIORESAL) 10 MG tablet   Oral   Take 1 tablet (10 mg total) by mouth at bedtime as needed (Take only if you have muscle spasms.).   30 each   3   . calcium carbonate (TUMS EX) 750 MG chewable tablet   Oral   Chew 1 tablet by mouth as needed for heartburn.         Marland Kitchen CINNAMON PO   Oral   Take 1 tablet by mouth daily.          . clotrimazole (LOTRIMIN) 1 % cream   Topical   Apply topically 2 (two) times daily.   60 g   0   . Cranberry 400 MG CAPS   Oral   Take 1 capsule by mouth 2 (two) times daily.         . fluticasone (FLONASE) 50 MCG/ACT nasal spray   Nasal   Place 2 sprays into the nose daily.   16 g   11   . glucose blood (ONE TOUCH ULTRA TEST) test strip      TEST BLOOD SUGAR  TWICE DAILY AS DIRECTED.  250.02   100 each   11   .  lisinopril (PRINIVIL,ZESTRIL) 10 MG tablet   Oral   Take 0.5 tablets (5 mg total) by mouth daily.   30 tablet   6   . metFORMIN (GLUCOPHAGE) 850 MG tablet      TAKE ONE TABLET BY MOUTH TWICE DAILY WITH A MEAL   180 tablet   2   . pantoprazole (PROTONIX) 40 MG tablet   Oral   Take 40 mg by mouth daily.         Marland Kitchen EXPIRED: sildenafil (VIAGRA) 50 MG tablet   Oral   Take 1 tablet (50 mg total) by mouth daily as needed for erectile dysfunction.   10 tablet   0   . Tamsulosin HCl (FLOMAX) 0.4 MG CAPS   Oral   Take 0.4 mg by mouth daily after supper.           Triage Vitals: BP 150/78  Pulse 86  Temp(Src) 98.1 F (36.7 C) (Oral)  Resp 16  SpO2 98%  Physical Exam  Nursing note and vitals reviewed. Constitutional: He is oriented to person, place, and time. He appears well-developed and well-nourished. No distress.  HENT:  Head: Normocephalic and atraumatic.  Eyes: Conjunctivae and EOM are normal.  Neck: Normal range of motion. Neck supple.  No meningeal signs  Cardiovascular: Normal rate, regular rhythm and normal heart sounds.  Exam reveals no gallop and no friction rub.   No murmur heard. Pulmonary/Chest: Effort normal and breath sounds normal. No respiratory distress. He has no wheezes. He has no rales. He exhibits no tenderness.  Abdominal: Soft. Bowel sounds are normal. He exhibits no distension. There is no tenderness. There is no rebound and no guarding.  Musculoskeletal: Normal range of motion. He exhibits no edema and no tenderness.  FROM to upper and lower extremities No step-offs noted on C-spine, tenderness to palpation of cervical paraspinous muscles.  Full range of motion of C-spine, T-spine or L-spine  Neurological: He is alert and oriented to person, place, and time. No cranial nerve deficit.  Speech is clear and goal oriented, follows commands Sensation normal to light touch Moves extremities  without ataxia, coordination intact Normal gait and balance Normal strength in upper and lower extremities bilaterally including dorsiflexion and plantar flexion, strong and equal grip strength   Skin: Skin is warm and dry. He is not diaphoretic. No erythema.  Psychiatric: He has a normal mood and affect.    ED Course  Procedures (including critical care time)  DIAGNOSTIC STUDIES: Oxygen Saturation is 98% on RA, normal by my interpretation.    COORDINATION OF CARE: 9:40 PM- Pt advised of plan for treatment and pt agrees.   Medications  oxyCODONE-acetaminophen (PERCOCET/ROXICET) 5-325 MG per tablet 2 tablet (2 tablets Oral Given 10/10/12 2143)     Labs Reviewed - No data to display No results found.   1. Cervicalgia    Discharge Medication List as of 10/10/2012  9:48 PM    START taking these medications   Details  diazepam (VALIUM) 5 MG tablet Take one tablet by mouth at bedtime as needed for muscle relaxer., Print    oxyCODONE-acetaminophen (PERCOCET/ROXICET) 5-325 MG per tablet Take one or two tablets by mouth every 4 to 6 hours as needed for pain. Do not take with Tylenol as this tablet already contains tylenol, Print          MDM  Pt does not have a hx of taking his pain medication outside of prescribed guidelines. Pt does not have a hx of seeking  pain medication in the ED setting. Discussed with pt importance of keeping an eye on his pain meds and not allowing them to run out. Discussed that the ED can offer relief on occasion, but that it is important he maintain a good standing relationship with regard to his pain medicine management. Pt stated that he understood. Will give Percocet in the ED and prescribe small amount for home along with a few muscle relaxers to give the pt some relief until his appointment. Discussed reasons to seek immediate care. Patient expresses understanding and agrees with plan.  I personally performed the services described in this  documentation, which was scribed in my presence. The recorded information has been reviewed and is accurate.    Glade Nurse, PA-C 10/10/12 2351

## 2012-10-11 NOTE — ED Provider Notes (Signed)
Medical screening examination/treatment/procedure(s) were performed by non-physician practitioner and as supervising physician I was immediately available for consultation/collaboration.   Carleene Cooper III, MD 10/11/12 [phone]

## 2012-12-01 ENCOUNTER — Telehealth: Payer: Self-pay | Admitting: Family Medicine

## 2012-12-01 NOTE — Telephone Encounter (Signed)
Patient Information:  Caller Name: [name]  Phone: [phone]  Patient: Robert, [name]  Gender: Male  DOB: 17-Jul-1950  Age: 62 Years  PCP: Eustaquio Boyden [facility])  Office Follow Up:  Does the office need to follow up with this patient?: No  Instructions For The Office: N/A   Symptoms  Reason For Call & Symptoms: Herald states he has been sneezing and nose runny. Left side of chest has sharp , tight pains onset 11/30/12 at 1800. Awake all night long. Having chest pain and left sided chest tightness. Sneezes when he lays down. Dizzy on 11/30/12. No dizziness on 12/01/12. Per chest pain protocol has see today in office disposition due to all other patients with chest pain. No appointments available in EPIC. Declined appointment at another [facility] Location. States he is going to ED now for evaluation..  Reviewed Health History In EMR: Yes  Reviewed Medications In EMR: Yes  Reviewed Allergies In EMR: Yes  Reviewed Surgeries / Procedures: Yes  Date of Onset of Symptoms: 11/30/2012  Guideline(s) Used:  Chest Pain  Disposition Per Guideline:   See Today in Office  Reason For Disposition Reached:   All other patients with chest pain  Advice Given:  Advised to take ASA 81mg   - 4 tabs now unless taking medication for erectile dysfunction  Patient Will Follow Care Advice:  YES

## 2012-12-01 NOTE — Telephone Encounter (Signed)
Does not need ER evaluation. Would recommend urgent care today at [facility] or see in office tomorrow.

## 2012-12-01 NOTE — Telephone Encounter (Signed)
Patient Information:  Caller Name: Aram Beecham  Phone: [phone]  Patient: [name]  Gender: Male  DOB: 04/14/1952  Age: 62 Years  PCP: Kelle Darting [facility])  Office Follow Up:  Does the office need to follow up with this patient?: Yes  Instructions For The Office: Requesting callback with recommendations . Had xrays of shoulder 11/28/12.  Having right arm pain with numbness ro right index and  middle finger onset 11/29/12.   Symptoms  Reason For Call & Symptoms: Aram Beecham states she received results of xrays but did not have any recommendations noted. C/o pain radiating down right arm- Rates arm pain a 10 at night . Cannot get comfortable and sleep. Numbess to index finger and middle finger on right hand onset 11/29/12. Onset of red , bumpy non itchy rash to inside of left elbow on 12/01/12.  Pain is relieved if she holds her arm up- keeping arm above her shoulder Per arm pain protocol has see today or tomorrow due to numbness in hands or fingers.  Requesting callback with recommndations at [phone]  Reviewed Health History In EMR: Yes  Reviewed Medications In EMR: Yes  Reviewed Allergies In EMR: Yes  Reviewed Surgeries / Procedures: Yes  Date of Onset of Symptoms: 11/04/2012  Treatments Tried: Robaxin, Ice, Heat  Treatments Tried Worked: No  Guideline(s) Used:  Arm Pain  Shoulder Injury  Ankle Pain  Disposition Per Guideline:   See Today or Tomorrow in Office  Reason For Disposition Reached:   Numbness (i.e., loss of sensation) in hand or fingers  Advice Given:  N/A  Patient Refused Recommendation:  Patient Refused Care Advice  Requesting callback. Was seen in office on 11/24/12.  Had xrays of thoracic spine on 11/28/12.

## 2012-12-01 NOTE — Telephone Encounter (Signed)
Spoke with patient. He said he didn't think he needed ER eval either and wasn't going. He said he was more concerned with the sneezing and RN that has kept him up all night and wanted and Rx for that. I advised for most any type of prescription he needed to be seen, but that he could probably could control his symptoms with OTC meds and recommended he speak to the pharmacist about what meds he could take with his current meds. I advised his symptoms sounded like common cold or allergy symptoms and neither needed prescription meds but I offered him an appt for tomorrow. He said he would call back if he decided he needed one, but for now he was just going to wait and see how he felt tomorrow because he had an appt with his shoulder dr. He said CP only happened if he got to sneezing a lot or went to blow his nose hard.

## 2012-12-22 ENCOUNTER — Telehealth: Payer: Self-pay | Admitting: Family Medicine

## 2012-12-22 NOTE — Telephone Encounter (Signed)
Received request for surgical clearance for upcoming L shoulder surgery. Please call pt to schedule 30 min appt for preop evaluation.

## 2012-12-23 NOTE — Telephone Encounter (Signed)
Appointment scheduled.

## 2012-12-26 ENCOUNTER — Ambulatory Visit (INDEPENDENT_AMBULATORY_CARE_PROVIDER_SITE_OTHER)
Admission: RE | Admit: 2012-12-26 | Discharge: 2012-12-26 | Disposition: A | Discharge status: Home / Self Care | Payer: Medicare Other | Source: Ambulatory Visit | Attending: Family Medicine | Admitting: Family Medicine

## 2012-12-26 ENCOUNTER — Encounter: Payer: Self-pay | Admitting: Family Medicine

## 2012-12-26 ENCOUNTER — Ambulatory Visit (INDEPENDENT_AMBULATORY_CARE_PROVIDER_SITE_OTHER): Payer: Medicare Other | Admitting: Family Medicine

## 2012-12-26 VITALS — BP 128/76 | HR 72 | Temp 99.6°F | Wt 234.5 lb

## 2012-12-26 DIAGNOSIS — E1165 Type 2 diabetes mellitus with hyperglycemia: Secondary | ICD-10-CM

## 2012-12-26 DIAGNOSIS — Z01818 Encounter for other preprocedural examination: Secondary | ICD-10-CM

## 2012-12-26 DIAGNOSIS — G459 Transient cerebral ischemic attack, unspecified: Secondary | ICD-10-CM

## 2012-12-26 DIAGNOSIS — E7800 Pure hypercholesterolemia, unspecified: Secondary | ICD-10-CM

## 2012-12-26 DIAGNOSIS — D509 Iron deficiency anemia, unspecified: Secondary | ICD-10-CM

## 2012-12-26 DIAGNOSIS — I10 Essential (primary) hypertension: Secondary | ICD-10-CM

## 2012-12-26 LAB — BASIC METABOLIC PANEL
CO2: 24 mEq/L (ref 19–32)
Calcium: 8.9 mg/dL (ref 8.4–10.5)
Chloride: 102 mEq/L (ref 96–112)
Creat: 0.99 mg/dL (ref 0.50–1.35)
Glucose, Bld: 90 mg/dL (ref 70–99)

## 2012-12-26 NOTE — Patient Instructions (Signed)
EKG today and xray today, blood work today. Blood work today. If all normal, we will send clearance form to Dr. Ranell Patrick. Good to see you today, call us with questions.

## 2012-12-26 NOTE — Progress Notes (Signed)
Subjective:    Patient ID: [name], male    DOB: [birth year], 62 y.o.   MRN: [id number]  HPI CC: preop evaluation  Azam presents today for preop evaluation for planned L shoulder surgery (arthroscopy with partial acromioplasty, distal clavicle resection) by Dr. Ranell Patrick for chronic debilitating L shoulder pain for the past year.  Takes intermittent oxycodone for pain control.  He has h/o T2DM, HTN, HLD, GERD and mild OSA off CPAP.  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  Lab Results  Component Value Date   HGBA1C 7.0* 06/16/2012    Endorses occasional chest discomfort associated with burning and shoulder pain.  Prior attributed to GERD and MSK cause including referred shoulder pain and h/o costochondritis. Denies dyspnea, palpitations.  No exertional chest pain. Does walk regularly twice a week, several blocks.  Never chest discomfort with walking. No regular exercise 2/2 shoulder pain. No h/o MI, CHF.   He has had ACDF, tonsillectomy, CTS release, R shoulder surgery, and inguinal hernia repair all requiring general anesthesia (latest 2013) and he has tolerated this well in the past.  There is a question of difficulty inducing anesthesia in the past. H/o noncardiac chest pain s/p eval including nuclear stress test and catheterization 2008 - nonobstructive CAD.  Last cardiology evaluation was 05/2011. Last CXR 08/2011 without acute finding. No recent EKG.  Not fasting today.  Medications and allergies reviewed and updated in chart.  Past histories reviewed and updated if relevant as below. Patient Active Problem List   Diagnosis Date Noted  . Tinea cruris 10/08/2012  . Costochondritis 10/01/2012  . Skin rash 10/01/2012  . Prostate nodule 06/16/2012  . Headache 02/12/2012  . Obesity 01/08/2012  . Esophageal dysphagia 10/10/2011  . Diverticulosis 10/09/2011  . 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 Insomnia 06/19/2011  . Shoulder pain 04/08/2011  . Neck pain,  chronic 02/24/2011  . Rhinitis 10/26/2010  . Medicare annual wellness visit, initial 10/26/2010  . ANEMIA, IRON DEFICIENCY 07/01/2009  . GERD 09/29/2008  . CALCIFIC TENDINITIS 04/26/2008  . ESSENTIAL HYPERTENSION 01/01/2008  . ANXIETY DEPRESSION 03/19/2007  . TIA 03/05/2007  . BACK PAIN WITH RADICULOPATHY 12/31/2006  . ABDOMINAL WALL HERNIA 10/03/2006  . Diabetes type 2, uncontrolled 10/01/2006  . COCAINE ABUSE, HX OF 10/01/2006  . HYPERCHOLESTEROLEMIA, PURE 08/21/2005   Past Medical History  Diagnosis Date  . GERD (gastroesophageal reflux disease)   . Hyperlipidemia   . Hypertension     Essential  . Anxiety   . Depression   . Stroke     TIA as no residual deficits?  . T2DM (type 2 diabetes mellitus)   . Esophageal stricture 06/2007    dilation of stricture  . History of epididymitis   . Back pain     with radiculopathy  . Circadian rhythm sleep disorder, irregular sleep-wake type   . Abdominal wall hernia   . History of cocaine use 2004  . Complication of anesthesia     hard to put patient to sleep  . Chest pain, non-cardiac 2008    adenosine myoview low risk.  EF 60%.  normal cath 2008, deemed atypical /noncardiac, ?discogenic  . Diverticulosis 2013    by CT scan  . History of prostatitis 08/2011    ?chronic/simmering, treated with 1 mo cipro  . Chronic neck pain     s/p ACDF C5-7 2010 ([facility])  . Gross hematuria 2013    negative CT, cystoscopy, cytology  . Bladder wall thickening 2013  nl rpt CT scan, normal cytology and UCx, released from urology [name])   Past Surgical History  Procedure Laterality Date  . Tonsillectomy  childhood  . Carpal tunnel release      Right  . Cardiac catheterization  2008    WNL   . Trigger finger release      Right long finger  . Inguinal hernia repair      bilateral, 5 cm. RLQ Abd Lipoma Excision (Dr. Ezzard Standing)  . Esophagogastroduodenoscopy  07/17/07    Esoph. Stricture dilated (Dr. Marina Goodell)  . Anterior cervical discectomy   10/28/2008    with fusion (Dr. Shon Baton)  . Hematoma evacuation  10/29/2008    (Dr. Shon Baton)  . Direct laryngoscopy  11/02/2008    Extubation under anesthesia (Dr. Lazarus Salines)  . Colonoscopy  10/11/2011    severe diverticulosis, int hemorrhoids, rec rpt 5 yrs [name])  . Esophagogastroduodenoscopy  10/11/2011    WNL [name])   History  Substance Use Topics  . Smoking status: Never Smoker   . Smokeless tobacco: Never Used  . Alcohol Use: No     Comment: Occasionally   Family History  Problem Relation Age of Onset  . Diabetes Mother   . Hypertension Mother   . Hyperlipidemia Mother   . Diabetes Father   . Diabetes Sister   . Hypertension Sister   . Hypertension Brother   . Diabetes Brother   . Heart disease Maternal Uncle     Heart failure  . Cancer Paternal Uncle     Prostate  . Cancer Paternal Grandfather     Prostate  . Stroke Neg Hx   . Diabetes Brother   . Hypertension Brother   . Diabetes Brother    Allergies  Allergen Reactions  . Menthol (Topical Analgesic) Rash    Burning rash   Current Outpatient Prescriptions on File Prior to Visit  Medication Sig Dispense Refill  . aspirin EC 81 MG tablet Take 81 mg by mouth daily.      Marland Kitchen atorvastatin (LIPITOR) 10 MG tablet Take 1 tablet (10 mg total) by mouth daily.  90 tablet  3  . baclofen (LIORESAL) 10 MG tablet Take 1 tablet (10 mg total) by mouth at bedtime as needed (Take only if you have muscle spasms.).  30 each  3  . diazepam (VALIUM) 5 MG tablet Take one tablet by mouth at bedtime as needed for muscle relaxer.  4 tablet  0  . glucose blood (ONE TOUCH ULTRA TEST) test strip TEST BLOOD SUGAR TWICE DAILY AS DIRECTED.  250.02  100 each  11  . lisinopril (PRINIVIL,ZESTRIL) 10 MG tablet Take 0.5 tablets (5 mg total) by mouth daily.  30 tablet  6  . metFORMIN (GLUCOPHAGE) 850 MG tablet TAKE ONE TABLET BY MOUTH TWICE DAILY WITH A MEAL  180 tablet  2  . oxyCODONE-acetaminophen (PERCOCET) 10-325 MG per tablet Take 1 tablet by  mouth every 4 (four) hours as needed for pain.      . Tamsulosin HCl (FLOMAX) 0.4 MG CAPS Take 0.4 mg by mouth daily after supper.       No current facility-administered medications on file prior to visit.    Review of Systems Per HPI     Objective:   Physical Exam  Nursing note and vitals reviewed. Constitutional: He appears well-developed and well-nourished. No distress.  HENT:  Head: Normocephalic and atraumatic.  Mouth/Throat: Oropharynx is clear and moist. No oropharyngeal exudate.  Eyes: Conjunctivae and EOM are normal. Pupils are  equal, round, and reactive to light. No scleral icterus.  Neck: Normal range of motion. Neck supple.  Cardiovascular: Normal rate, regular rhythm, normal heart sounds and intact distal pulses.   No murmur heard. Pulmonary/Chest: Effort normal and breath sounds normal. No respiratory distress. He has no wheezes. He has no rales.  Musculoskeletal: He exhibits no edema.  Diabetic foot exam: Normal inspection No skin breakdown No calluses  Normal DP/PT pulses Normal sensation to light touch and monofilament Nails normal  Lymphadenopathy:    He has no cervical adenopathy.  Skin: Skin is warm and dry. No rash noted.  Psychiatric: He has a normal mood and affect.       Assessment & Plan:

## 2012-12-27 ENCOUNTER — Other Ambulatory Visit: Payer: Self-pay | Admitting: Family Medicine

## 2012-12-27 ENCOUNTER — Encounter: Payer: Self-pay | Admitting: Family Medicine

## 2012-12-27 DIAGNOSIS — G4733 Obstructive sleep apnea (adult) (pediatric): Secondary | ICD-10-CM | POA: Insufficient documentation

## 2012-12-27 LAB — CBC WITH DIFFERENTIAL/PLATELET
Basophils Absolute: 0 10*3/uL (ref 0.0–0.1)
Eosinophils Absolute: 0.1 10*3/uL (ref 0.0–0.7)
Eosinophils Relative: 2 % (ref 0–5)
HCT: 39.9 % (ref 39.0–52.0)
Lymphocytes Relative: 31 % (ref 12–46)
Lymphs Abs: 2.3 10*3/uL (ref 0.7–4.0)
MCH: 30 pg (ref 26.0–34.0)
MCV: 86.2 fL (ref 78.0–100.0)
Monocytes Absolute: 0.7 10*3/uL (ref 0.1–1.0)
Platelets: 261 10*3/uL (ref 150–400)
RDW: 15 % (ref 11.5–15.5)

## 2012-12-27 MED ORDER — METFORMIN HCL 1000 MG PO TABS: 1000.0000 mg | ORAL_TABLET | Freq: Two times a day (BID) | ORAL | Status: DC

## 2012-12-27 NOTE — Assessment & Plan Note (Addendum)
Chronic, stable. Continue lisinopril 5mg daily. 

## 2012-12-27 NOTE — Assessment & Plan Note (Addendum)
Chronic. Check A1c today.  Previous A1c 7.0% Would hold metformin prior to surgery, use perioperative SSI for glycemic control.

## 2012-12-27 NOTE — Assessment & Plan Note (Signed)
Check dLDL today. Continue lipitor 10mg .

## 2012-12-27 NOTE — Assessment & Plan Note (Addendum)
Discussed risks of surgery under GETA as well as anticipated benefits from surgery. Check blood work including A1c, EKG and CXR today  - assuming normal should be adequately low risk to undergo planned procedure. EKG stable, no need for further cardiac evaluation prior to surgery. There is a question of difficulty achieving anesthesia in the past - unclear details. Would continue aspirin perioperatively if able.  EKG - NSR rate 70, normal axis, intervals, no acute ST/T changes CXR - WNL

## 2012-12-27 NOTE — Assessment & Plan Note (Signed)
Would continue ASA 81mg  daily perioperatively.

## 2013-01-12 ENCOUNTER — Encounter (HOSPITAL_COMMUNITY): Payer: Self-pay | Admitting: Pharmacy Technician

## 2013-01-14 NOTE — Pre-Procedure Instructions (Addendum)
[name]  01/14/2013   Your procedure is scheduled on:   Friday  01/23/13  Report to [facility]  2 * 3 at 830 AM.  Call this number if you have problems the morning of surgery: [phone]   Remember:   Do not eat food or drink liquids after midnight.   Take these medicines the morning of surgery with A SIP OF WATER: OXYCODONE(PERCOCET) IF NEEDED    Do not wear jewelry, make-up or nail polish.  Do not wear lotions, powders, or perfumes. You may wear deodorant.  Do not shave 48 hours prior to surgery. Men may shave face and neck.  Do not bring valuables to the hospital.  [facility] is not responsible                  for any belongings or valuables.               Contacts, dentures or bridgework may not be worn into surgery.  Leave suitcase in the car. After surgery it may be brought to your room.  For patients admitted to the hospital, discharge time is determined by your                treatment team.               Patients discharged the day of surgery will not be allowed to drive  home.  Name and phone number of your driver:   Special Instructions: Shower using CHG 2 nights before surgery and the night before surgery.  If you shower the day of surgery use CHG.  Use special wash - you have one bottle of CHG for all showers.  You should use approximately 1/3 of the bottle for each shower.   Please read over the following fact sheets that you were given: PAIN PAMPHLET, COUGH+ DEEP BREATHE, SURGICAL SITE INFECTION

## 2013-01-15 ENCOUNTER — Inpatient Hospital Stay (HOSPITAL_COMMUNITY)
Admission: RE | Admit: 2013-01-15 | Discharge: 2013-01-15 | Disposition: A | Discharge status: Home / Self Care | Payer: Medicare Other | Source: Ambulatory Visit

## 2013-01-15 NOTE — Progress Notes (Signed)
Nurse called patient and left voicemail about scheduled 0800 PAT appointment and requested that he return call at his earliest convenience.
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
## 2013-01-20 ENCOUNTER — Encounter (HOSPITAL_COMMUNITY)
Admission: RE | Admit: 2013-01-20 | Discharge: 2013-01-20 | Disposition: A | Discharge status: Home / Self Care | Payer: Medicare Other | Source: Ambulatory Visit | Attending: Orthopedic Surgery | Admitting: Orthopedic Surgery

## 2013-01-20 ENCOUNTER — Encounter (HOSPITAL_COMMUNITY): Payer: Self-pay

## 2013-01-20 LAB — CBC
HCT: 40.3 % (ref 39.0–52.0)
MCH: 31.8 pg (ref 26.0–34.0)
MCV: 87.8 fL (ref 78.0–100.0)
Platelets: 279 10*3/uL (ref 150–400)
RDW: 14.7 % (ref 11.5–15.5)

## 2013-01-20 LAB — BASIC METABOLIC PANEL
CO2: 24 mEq/L (ref 19–32)
Calcium: 9.1 mg/dL (ref 8.4–10.5)
Chloride: 102 mEq/L (ref 96–112)
Creatinine, Ser: 0.94 mg/dL (ref 0.50–1.35)
Glucose, Bld: 138 mg/dL — ABNORMAL HIGH (ref 70–99)

## 2013-01-20 NOTE — H&P (Signed)
[name] is an 62 y.o. male.    Chief Complaint: left shoulder pain   HPI: Pt is a 62 y.o. male complaining of left shoulder pain for multiple years. Pain had continually increased since the beginning. X-rays in the clinic show rotator cuff tear left shoulder. Pt has tried various conservative treatments which have failed to alleviate their symptoms, including injections and therapy. Various options are discussed with the patient. Risks, benefits and expectations were discussed with the patient. Patient understand the risks, benefits and expectations and wishes to proceed with surgery.   PCP:  Eustaquio Boyden, MD  D/C Plans:  Home   PMH: Past Medical History  Diagnosis Date  . GERD (gastroesophageal reflux disease)   . Hyperlipidemia   . Hypertension     Essential  . Anxiety   . Depression   . TIA (transient ischemic attack)     cocaine related  . T2DM (type 2 diabetes mellitus)   . Esophageal stricture 06/2007    dilation of stricture  . History of epididymitis   . Back pain     with radiculopathy  . Circadian rhythm sleep disorder, irregular sleep-wake type   . Abdominal wall hernia   . History of cocaine use 2004  . Complication of anesthesia     hard to put patient to sleep  . Chest pain, non-cardiac 2008    adenosine myoview low risk.  EF 60%.  normal cath 2008, deemed atypical /noncardiac, ?discogenic  . Diverticulosis 2013    by CT scan  . History of prostatitis 08/2011    ?chronic/simmering, treated with 1 mo cipro  . Chronic neck pain     s/p ACDF C5-7 2010 ([facility])  . Gross hematuria 2013    negative CT, cystoscopy, cytology  . Bladder wall thickening 2013    nl rpt CT scan, normal cytology and UCx, released from urology [name])  . OSA (obstructive sleep apnea) 2010    mild off CPAP    PSH: Past Surgical History  Procedure Laterality Date  . Tonsillectomy  childhood  . Carpal tunnel release      Right  . Cardiac catheterization  2008    WNL    . Trigger finger release      Right long finger  . Inguinal hernia repair      bilateral, 5 cm. RLQ Abd Lipoma Excision (Dr. Ezzard Standing)  . Esophagogastroduodenoscopy  07/17/07    Esoph. Stricture dilated (Dr. Marina Goodell)  . Anterior cervical discectomy  10/28/2008    with fusion (Dr. Shon Baton)  . Hematoma evacuation  10/29/2008    (Dr. Shon Baton)  . Direct laryngoscopy  11/02/2008    Extubation under anesthesia (Dr. Lazarus Salines)  . Colonoscopy  10/11/2011    severe diverticulosis, int hemorrhoids, rec rpt 5 yrs [name])  . Esophagogastroduodenoscopy  10/11/2011    WNL [name])  . Shoulder surgery Right 2013    replaced    Social History:  reports that he has never smoked. He has never used smokeless tobacco. He reports that he does not drink alcohol or use illicit drugs.  Allergies:  Allergies  Allergen Reactions  . Menthol (Topical Analgesic) Rash    Burning rash    Medications: No current facility-administered medications for this encounter.   Current Outpatient Prescriptions  Medication Sig Dispense Refill  . aspirin EC 81 MG tablet Take 81 mg by mouth daily.      Marland Kitchen atorvastatin (LIPITOR) 10 MG tablet Take 1 tablet (10 mg total) by mouth daily.  90 tablet  3  . baclofen (LIORESAL) 10 MG tablet Take 10 mg by mouth at bedtime as needed (for back spasms).      Marland Kitchen lisinopril (PRINIVIL,ZESTRIL) 10 MG tablet Take 0.5 tablets (5 mg total) by mouth daily.  30 tablet  6  . metFORMIN (GLUCOPHAGE) 1000 MG tablet Take 1 tablet (1,000 mg total) by mouth 2 (two) times daily with a meal.  180 tablet  3  . oxyCODONE-acetaminophen (PERCOCET) 10-325 MG per tablet Take 1 tablet by mouth every 4 (four) hours as needed for pain.      . Tamsulosin HCl (FLOMAX) 0.4 MG CAPS Take 0.4 mg by mouth daily after supper.      Marland Kitchen glucose blood (ONE TOUCH ULTRA TEST) test strip TEST BLOOD SUGAR TWICE DAILY AS DIRECTED.  250.02  100 each  11    No results found for this or any previous visit (from the past 48  hour(s)). No results found.  ROS: Pain with rom of the left upper extremity  Physical Exam: Alert and oriented 62 y.o. male in no acute distress Cranial nerves 2-12 intact Cervical spine: full rom with no tenderness, nv intact distally Chest: active breath sounds bilaterally, no wheeze rhonchi or rales Heart: regular rate and rhythm, no murmur Abd: non tender non distended with active bowel sounds Hip is stable with rom  Mild decrease in rom of left shoulder nv intact distally Mild weakness with ER and IR as compared to right  No rashes   Assessment/Plan Assessment: left shoulder pain due to rotator cuff tear  Plan: Patient will undergo a left shoulder rotator cuff repair by Dr. Ranell Patrick at [facility]. Risks benefits and expectations were discussed with the patient. Patient understand risks, benefits and expectations and wishes to proceed.

## 2013-01-21 ENCOUNTER — Other Ambulatory Visit (HOSPITAL_COMMUNITY): Payer: Medicare Other

## 2013-01-22 MED ORDER — CEFAZOLIN SODIUM-DEXTROSE 2-3 GM-% IV SOLR
2.0000 g | INTRAVENOUS | Status: AC
  Administered 2013-01-23: 2 g via INTRAVENOUS
  Filled 2013-01-22: qty 50

## 2013-01-23 ENCOUNTER — Encounter (HOSPITAL_COMMUNITY): Payer: Self-pay | Admitting: *Deleted

## 2013-01-23 ENCOUNTER — Observation Stay (HOSPITAL_COMMUNITY)
Admission: RE | Admit: 2013-01-23 | Discharge: 2013-01-24 | Disposition: A | Discharge status: Home / Self Care | Payer: Medicare Other | Source: Ambulatory Visit | Attending: Orthopedic Surgery | Admitting: Orthopedic Surgery

## 2013-01-23 ENCOUNTER — Ambulatory Visit (HOSPITAL_COMMUNITY): Payer: Medicare Other | Admitting: Vascular Surgery

## 2013-01-23 ENCOUNTER — Encounter (HOSPITAL_COMMUNITY)
Admission: RE | Disposition: A | Discharge status: Home / Self Care | Payer: Self-pay | Source: Ambulatory Visit | Attending: Orthopedic Surgery

## 2013-01-23 ENCOUNTER — Encounter (HOSPITAL_COMMUNITY): Payer: Self-pay | Admitting: Vascular Surgery

## 2013-01-23 DIAGNOSIS — F3289 Other specified depressive episodes: Secondary | ICD-10-CM | POA: Insufficient documentation

## 2013-01-23 DIAGNOSIS — M659 Unspecified synovitis and tenosynovitis, unspecified site: Secondary | ICD-10-CM | POA: Insufficient documentation

## 2013-01-23 DIAGNOSIS — E785 Hyperlipidemia, unspecified: Secondary | ICD-10-CM | POA: Insufficient documentation

## 2013-01-23 DIAGNOSIS — F329 Major depressive disorder, single episode, unspecified: Secondary | ICD-10-CM | POA: Insufficient documentation

## 2013-01-23 DIAGNOSIS — X58XXXA Exposure to other specified factors, initial encounter: Secondary | ICD-10-CM | POA: Insufficient documentation

## 2013-01-23 DIAGNOSIS — Z8673 Personal history of transient ischemic attack (TIA), and cerebral infarction without residual deficits: Secondary | ICD-10-CM | POA: Insufficient documentation

## 2013-01-23 DIAGNOSIS — K219 Gastro-esophageal reflux disease without esophagitis: Secondary | ICD-10-CM | POA: Insufficient documentation

## 2013-01-23 DIAGNOSIS — M719 Bursopathy, unspecified: Principal | ICD-10-CM | POA: Insufficient documentation

## 2013-01-23 DIAGNOSIS — F411 Generalized anxiety disorder: Secondary | ICD-10-CM | POA: Insufficient documentation

## 2013-01-23 DIAGNOSIS — M67919 Unspecified disorder of synovium and tendon, unspecified shoulder: Principal | ICD-10-CM | POA: Insufficient documentation

## 2013-01-23 DIAGNOSIS — M75102 Unspecified rotator cuff tear or rupture of left shoulder, not specified as traumatic: Secondary | ICD-10-CM

## 2013-01-23 DIAGNOSIS — S43439A Superior glenoid labrum lesion of unspecified shoulder, initial encounter: Secondary | ICD-10-CM | POA: Insufficient documentation

## 2013-01-23 DIAGNOSIS — Z5333 Arthroscopic surgical procedure converted to open procedure: Secondary | ICD-10-CM | POA: Insufficient documentation

## 2013-01-23 DIAGNOSIS — E119 Type 2 diabetes mellitus without complications: Secondary | ICD-10-CM | POA: Insufficient documentation

## 2013-01-23 DIAGNOSIS — I10 Essential (primary) hypertension: Secondary | ICD-10-CM | POA: Insufficient documentation

## 2013-01-23 HISTORY — PX: SHOULDER ARTHROSCOPY WITH SUBACROMIAL DECOMPRESSION AND OPEN ROTATOR C: SHX5688

## 2013-01-23 LAB — GLUCOSE, CAPILLARY: Glucose-Capillary: 116 mg/dL — ABNORMAL HIGH (ref 70–99)

## 2013-01-23 SURGERY — SHOULDER ARTHROSCOPY WITH SUBACROMIAL DECOMPRESSION AND OPEN ROTATOR CUFF REPAIR, OPEN BICEPS TENDON REPAIR
Anesthesia: Regional | Site: Shoulder | Laterality: Left | Wound class: Clean

## 2013-01-23 MED ORDER — LISINOPRIL 5 MG PO TABS
5.0000 mg | ORAL_TABLET | Freq: Every day | ORAL | Status: DC
Start: 2013-01-23 — End: 2013-01-24
  Administered 2013-01-23 – 2013-01-24 (×2): 5 mg via ORAL
  Filled 2013-01-23 (×2): qty 1

## 2013-01-23 MED ORDER — BISACODYL 10 MG RE SUPP: 10.0000 mg | Freq: Every day | RECTAL | Status: DC | PRN

## 2013-01-23 MED ORDER — ONDANSETRON HCL 4 MG/2ML IJ SOLN
INTRAMUSCULAR | Status: DC | PRN
  Administered 2013-01-23: 4 mg via INTRAVENOUS

## 2013-01-23 MED ORDER — INSULIN ASPART 100 UNIT/ML ~~LOC~~ SOLN: 0.0000 [IU] | Freq: Every day | SUBCUTANEOUS | Status: DC

## 2013-01-23 MED ORDER — METHOCARBAMOL 500 MG PO TABS
500.0000 mg | ORAL_TABLET | Freq: Four times a day (QID) | ORAL | Status: DC | PRN
  Administered 2013-01-23 – 2013-01-24 (×3): 500 mg via ORAL
  Filled 2013-01-23 (×4): qty 1

## 2013-01-23 MED ORDER — ACETAMINOPHEN 650 MG RE SUPP: 650.0000 mg | Freq: Four times a day (QID) | RECTAL | Status: DC | PRN

## 2013-01-23 MED ORDER — GLYCOPYRROLATE 0.2 MG/ML IJ SOLN
INTRAMUSCULAR | Status: DC | PRN
  Administered 2013-01-23: 0.6 mg via INTRAVENOUS

## 2013-01-23 MED ORDER — ROCURONIUM BROMIDE 100 MG/10ML IV SOLN
INTRAVENOUS | Status: DC | PRN
  Administered 2013-01-23: 50 mg via INTRAVENOUS

## 2013-01-23 MED ORDER — ARTIFICIAL TEARS OP OINT
TOPICAL_OINTMENT | OPHTHALMIC | Status: DC | PRN
  Administered 2013-01-23: 1 via OPHTHALMIC

## 2013-01-23 MED ORDER — ASPIRIN EC 81 MG PO TBEC
81.0000 mg | DELAYED_RELEASE_TABLET | Freq: Every day | ORAL | Status: DC
  Administered 2013-01-23 – 2013-01-24 (×2): 81 mg via ORAL
  Filled 2013-01-23 (×2): qty 1

## 2013-01-23 MED ORDER — MIDAZOLAM HCL 2 MG/2ML IJ SOLN
2.0000 mg | Freq: Once | INTRAMUSCULAR | Status: AC
  Administered 2013-01-23: 2 mg via INTRAVENOUS

## 2013-01-23 MED ORDER — BACLOFEN 10 MG PO TABS
10.0000 mg | ORAL_TABLET | Freq: Every evening | ORAL | Status: DC | PRN
  Filled 2013-01-23: qty 1

## 2013-01-23 MED ORDER — ATORVASTATIN CALCIUM 10 MG PO TABS
10.0000 mg | ORAL_TABLET | Freq: Every day | ORAL | Status: DC
  Administered 2013-01-23: 10 mg via ORAL
  Filled 2013-01-23 (×2): qty 1

## 2013-01-23 MED ORDER — HYDROMORPHONE HCL PF 1 MG/ML IJ SOLN
0.2500 mg | INTRAMUSCULAR | Status: DC | PRN
  Administered 2013-01-23 (×2): 0.5 mg via INTRAVENOUS

## 2013-01-23 MED ORDER — CEFAZOLIN SODIUM-DEXTROSE 2-3 GM-% IV SOLR
2.0000 g | Freq: Four times a day (QID) | INTRAVENOUS | Status: AC
  Administered 2013-01-23 – 2013-01-24 (×3): 2 g via INTRAVENOUS
  Filled 2013-01-23 (×3): qty 50

## 2013-01-23 MED ORDER — HYDROMORPHONE HCL PF 1 MG/ML IJ SOLN
INTRAMUSCULAR | Status: AC
  Filled 2013-01-23: qty 1

## 2013-01-23 MED ORDER — ONDANSETRON HCL 4 MG PO TABS: 4.0000 mg | ORAL_TABLET | Freq: Four times a day (QID) | ORAL | Status: DC | PRN

## 2013-01-23 MED ORDER — TAMSULOSIN HCL 0.4 MG PO CAPS
0.4000 mg | ORAL_CAPSULE | Freq: Every day | ORAL | Status: DC
Start: 2013-01-23 — End: 2013-01-24
  Administered 2013-01-23: 0.4 mg via ORAL
  Filled 2013-01-23 (×2): qty 1

## 2013-01-23 MED ORDER — FENTANYL CITRATE 0.05 MG/ML IJ SOLN
INTRAMUSCULAR | Status: AC
  Administered 2013-01-23: 100 ug via INTRAVENOUS
  Filled 2013-01-23: qty 2

## 2013-01-23 MED ORDER — FENTANYL CITRATE 0.05 MG/ML IJ SOLN
INTRAMUSCULAR | Status: DC | PRN
  Administered 2013-01-23: 50 ug via INTRAVENOUS
  Administered 2013-01-23 (×2): 100 ug via INTRAVENOUS

## 2013-01-23 MED ORDER — CHLORHEXIDINE GLUCONATE 4 % EX LIQD: 60.0000 mL | Freq: Once | CUTANEOUS | Status: DC

## 2013-01-23 MED ORDER — METOCLOPRAMIDE HCL 10 MG PO TABS: 5.0000 mg | ORAL_TABLET | Freq: Three times a day (TID) | ORAL | Status: DC | PRN

## 2013-01-23 MED ORDER — NEOSTIGMINE METHYLSULFATE 1 MG/ML IJ SOLN
INTRAMUSCULAR | Status: DC | PRN
  Administered 2013-01-23: 4 mg via INTRAVENOUS

## 2013-01-23 MED ORDER — LACTATED RINGERS IV SOLN
INTRAVENOUS | Status: DC | PRN
  Administered 2013-01-23 (×2): via INTRAVENOUS

## 2013-01-23 MED ORDER — FENTANYL CITRATE 0.05 MG/ML IJ SOLN
100.0000 ug | Freq: Once | INTRAMUSCULAR | Status: AC
  Administered 2013-01-23: 100 ug via INTRAVENOUS

## 2013-01-23 MED ORDER — OXYCODONE HCL 5 MG PO TABS
5.0000 mg | ORAL_TABLET | ORAL | Status: DC | PRN
  Administered 2013-01-23 – 2013-01-24 (×4): 5 mg via ORAL
  Filled 2013-01-23 (×4): qty 1

## 2013-01-23 MED ORDER — OXYCODONE-ACETAMINOPHEN 5-325 MG PO TABS
1.0000 | ORAL_TABLET | ORAL | Status: DC | PRN
  Administered 2013-01-23 – 2013-01-24 (×3): 1 via ORAL
  Filled 2013-01-23 (×3): qty 1

## 2013-01-23 MED ORDER — MIDAZOLAM HCL 5 MG/5ML IJ SOLN
INTRAMUSCULAR | Status: DC | PRN
  Administered 2013-01-23: 2 mg via INTRAVENOUS

## 2013-01-23 MED ORDER — OXYCODONE HCL 5 MG PO TABS: 5.0000 mg | ORAL_TABLET | Freq: Once | ORAL | Status: DC | PRN

## 2013-01-23 MED ORDER — METHOCARBAMOL 100 MG/ML IJ SOLN
500.0000 mg | Freq: Four times a day (QID) | INTRAVENOUS | Status: DC | PRN
  Filled 2013-01-23: qty 5

## 2013-01-23 MED ORDER — SODIUM CHLORIDE 0.9 % IV SOLN
INTRAVENOUS | Status: DC
  Administered 2013-01-23: 21:00:00 via INTRAVENOUS

## 2013-01-23 MED ORDER — MIDAZOLAM HCL 5 MG/ML IJ SOLN: 2.0000 mg | Freq: Once | INTRAMUSCULAR | Status: DC

## 2013-01-23 MED ORDER — HYDROMORPHONE HCL PF 1 MG/ML IJ SOLN
1.0000 mg | INTRAMUSCULAR | Status: DC | PRN
  Administered 2013-01-24 (×3): 2 mg via INTRAVENOUS
  Filled 2013-01-23 (×3): qty 2

## 2013-01-23 MED ORDER — OXYCODONE HCL 5 MG/5ML PO SOLN: 5.0000 mg | Freq: Once | ORAL | Status: DC | PRN

## 2013-01-23 MED ORDER — PROPOFOL 10 MG/ML IV BOLUS
INTRAVENOUS | Status: DC | PRN
  Administered 2013-01-23 (×2): 100 mg via INTRAVENOUS
  Administered 2013-01-23: 200 mg via INTRAVENOUS

## 2013-01-23 MED ORDER — OXYCODONE-ACETAMINOPHEN 10-325 MG PO TABS: 1.0000 | ORAL_TABLET | ORAL | Status: DC | PRN

## 2013-01-23 MED ORDER — SODIUM CHLORIDE 0.9 % IR SOLN
Status: DC | PRN
  Administered 2013-01-23: 6000 mL

## 2013-01-23 MED ORDER — METHOCARBAMOL 500 MG PO TABS: 500.0000 mg | ORAL_TABLET | Freq: Three times a day (TID) | ORAL | Status: DC | PRN

## 2013-01-23 MED ORDER — BUPIVACAINE-EPINEPHRINE PF 0.25-1:200000 % IJ SOLN
INTRAMUSCULAR | Status: AC
  Filled 2013-01-23: qty 30

## 2013-01-23 MED ORDER — PHENOL 1.4 % MT LIQD: 1.0000 | OROMUCOSAL | Status: DC | PRN

## 2013-01-23 MED ORDER — BUPIVACAINE-EPINEPHRINE 0.25% -1:200000 IJ SOLN
INTRAMUSCULAR | Status: DC | PRN
  Administered 2013-01-23: 7 mL

## 2013-01-23 MED ORDER — MIDAZOLAM HCL 2 MG/2ML IJ SOLN
INTRAMUSCULAR | Status: AC
  Administered 2013-01-23: 2 mg via INTRAVENOUS
  Filled 2013-01-23: qty 2

## 2013-01-23 MED ORDER — LACTATED RINGERS IV SOLN
INTRAVENOUS | Status: DC
  Administered 2013-01-23: 08:00:00 via INTRAVENOUS

## 2013-01-23 MED ORDER — MENTHOL 3 MG MT LOZG: 1.0000 | LOZENGE | OROMUCOSAL | Status: DC | PRN

## 2013-01-23 MED ORDER — ONDANSETRON HCL 4 MG/2ML IJ SOLN: 4.0000 mg | Freq: Four times a day (QID) | INTRAMUSCULAR | Status: DC | PRN

## 2013-01-23 MED ORDER — INSULIN ASPART 100 UNIT/ML ~~LOC~~ SOLN
0.0000 [IU] | Freq: Three times a day (TID) | SUBCUTANEOUS | Status: DC
  Administered 2013-01-24: 3 [IU] via SUBCUTANEOUS
  Administered 2013-01-24: 5 [IU] via SUBCUTANEOUS
  Administered 2013-01-24: 4 [IU] via SUBCUTANEOUS

## 2013-01-23 MED ORDER — ACETAMINOPHEN 325 MG PO TABS
650.0000 mg | ORAL_TABLET | Freq: Four times a day (QID) | ORAL | Status: DC | PRN
  Filled 2013-01-23: qty 2

## 2013-01-23 MED ORDER — METOCLOPRAMIDE HCL 5 MG/ML IJ SOLN: 5.0000 mg | Freq: Three times a day (TID) | INTRAMUSCULAR | Status: DC | PRN

## 2013-01-23 MED ORDER — BUPIVACAINE-EPINEPHRINE PF 0.5-1:200000 % IJ SOLN
INTRAMUSCULAR | Status: DC | PRN
  Administered 2013-01-23: 30 mL

## 2013-01-23 MED ORDER — METFORMIN HCL 500 MG PO TABS
1000.0000 mg | ORAL_TABLET | Freq: Two times a day (BID) | ORAL | Status: DC
  Administered 2013-01-23 – 2013-01-24 (×2): 1000 mg via ORAL
  Filled 2013-01-23 (×4): qty 2

## 2013-01-23 MED ORDER — INSULIN ASPART 100 UNIT/ML ~~LOC~~ SOLN
4.0000 [IU] | Freq: Three times a day (TID) | SUBCUTANEOUS | Status: DC
  Administered 2013-01-24: 4 [IU] via SUBCUTANEOUS

## 2013-01-23 MED ORDER — LIDOCAINE HCL (CARDIAC) 20 MG/ML IV SOLN
INTRAVENOUS | Status: DC | PRN
  Administered 2013-01-23: 40 mg via INTRAVENOUS

## 2013-01-23 SURGICAL SUPPLY — 52 items
ANCHOR ALL-SUT FLEX 1.3 Y-KNOT (Anchor) ×4 IMPLANT
BIT DRILL 1.3M DISPOSABLE (BIT) ×2 IMPLANT
BLADE SURG 11 STRL SS (BLADE) ×2 IMPLANT
BUR OVAL 4.0 (BURR) IMPLANT
CLOTH BEACON ORANGE TIMEOUT ST (SAFETY) ×2 IMPLANT
COVER SURGICAL LIGHT HANDLE (MISCELLANEOUS) ×2 IMPLANT
DRAPE INCISE IOBAN 66X45 STRL (DRAPES) ×2 IMPLANT
DRAPE STERI 35X30 U-POUCH (DRAPES) ×2 IMPLANT
DRAPE U-SHAPE 47X51 STRL (DRAPES) ×2 IMPLANT
DRSG EMULSION OIL 3X3 NADH (GAUZE/BANDAGES/DRESSINGS) ×4 IMPLANT
DRSG PAD ABDOMINAL 8X10 ST (GAUZE/BANDAGES/DRESSINGS) ×4 IMPLANT
DURAPREP 26ML APPLICATOR (WOUND CARE) ×2 IMPLANT
ELECT REM PT RETURN 9FT ADLT (ELECTROSURGICAL)
ELECTRODE REM PT RTRN 9FT ADLT (ELECTROSURGICAL) IMPLANT
GLOVE BIOGEL PI ORTHO PRO 7.5 (GLOVE) ×1
GLOVE BIOGEL PI ORTHO PRO SZ8 (GLOVE) ×1
GLOVE ORTHO TXT STRL SZ7.5 (GLOVE) ×2 IMPLANT
GLOVE PI ORTHO PRO STRL 7.5 (GLOVE) ×1 IMPLANT
GLOVE PI ORTHO PRO STRL SZ8 (GLOVE) ×1 IMPLANT
GLOVE SURG ORTHO 8.5 STRL (GLOVE) ×2 IMPLANT
GOWN STRL REIN XL XLG (GOWN DISPOSABLE) ×8 IMPLANT
KIT BASIN OR (CUSTOM PROCEDURE TRAY) ×2 IMPLANT
KIT ROOM TURNOVER OR (KITS) ×2 IMPLANT
MANIFOLD NEPTUNE II (INSTRUMENTS) ×2 IMPLANT
NDL SUT .5 MAYO 1.404X.05X (NEEDLE) ×1 IMPLANT
NDL SUT 6 .5 CRC .975X.05 MAYO (NEEDLE) ×1 IMPLANT
NEEDLE HYPO 25GX1X1/2 BEV (NEEDLE) ×2 IMPLANT
NEEDLE MAYO TAPER (NEEDLE) ×2
NEEDLE SPNL 18GX3.5 QUINCKE PK (NEEDLE) ×2 IMPLANT
NS IRRIG 1000ML POUR BTL (IV SOLUTION) ×2 IMPLANT
PACK SHOULDER (CUSTOM PROCEDURE TRAY) ×2 IMPLANT
PAD ARMBOARD 7.5X6 YLW CONV (MISCELLANEOUS) ×4 IMPLANT
RESECTOR FULL RADIUS 4.2MM (BLADE) ×2 IMPLANT
SET ARTHROSCOPY TUBING (MISCELLANEOUS) ×1
SET ARTHROSCOPY TUBING LN (MISCELLANEOUS) ×1 IMPLANT
SLING ARM FOAM STRAP LRG (SOFTGOODS) ×2 IMPLANT
SLING ARM FOAM STRAP MED (SOFTGOODS) IMPLANT
SPONGE GAUZE 4X4 12PLY (GAUZE/BANDAGES/DRESSINGS) ×2 IMPLANT
STRIP CLOSURE SKIN 1/2X4 (GAUZE/BANDAGES/DRESSINGS) ×2 IMPLANT
SUT FIBERWIRE #2 38 T-5 BLUE (SUTURE)
SUT MNCRL AB 3-0 PS2 18 (SUTURE) ×2 IMPLANT
SUT VIC AB 0 CT2 27 (SUTURE) IMPLANT
SUT VIC AB 2-0 CT1 27 (SUTURE) ×1
SUT VIC AB 2-0 CT1 TAPERPNT 27 (SUTURE) ×1 IMPLANT
SUT VICRYL 0 CT 1 36IN (SUTURE) ×4 IMPLANT
SUTURE FIBERWR #2 38 T-5 BLUE (SUTURE) IMPLANT
SYR CONTROL 10ML LL (SYRINGE) ×2 IMPLANT
TOWEL OR 17X24 6PK STRL BLUE (TOWEL DISPOSABLE) ×2 IMPLANT
TOWEL OR 17X26 10 PK STRL BLUE (TOWEL DISPOSABLE) ×2 IMPLANT
TUBE CONNECTING 12X1/4 (SUCTIONS) ×2 IMPLANT
WAND 90 DEG TURBOVAC W/CORD (SURGICAL WAND) ×2 IMPLANT
WATER STERILE IRR 1000ML POUR (IV SOLUTION) ×2 IMPLANT

## 2013-01-23 NOTE — Interval H&P Note (Signed)
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 After consideration of risks, benefits and other options for treatment, the patient has consented to  Procedure(s): LEFT SHOULDER ARTHROSCOPY WITH SUBACROMIAL DECOMPRESSION AND MINI OPEN ROTATOR CUFF REPAIR, AND OPEN DISTAL CLAVICLE RESECTION (Left) as a surgical intervention .  The patient's history has been reviewed, patient examined, no change in status, stable for surgery.  I have reviewed the patient's chart and labs.  Questions were answered to the patient's satisfaction.     Aryahi Denzler,STEVEN R

## 2013-01-23 NOTE — Op Note (Signed)
[name], [name]             ACCOUNT NO.:  [id number]  MEDICAL RECORD NO.:  [id number]  LOCATION:  [facility]                         FACILITY:  [facility]  PHYSICIAN:  Almedia Balls. Ranell Patrick, M.D. DATE OF BIRTH:  09-18-1950  DATE OF PROCEDURE:  01/23/2013 DATE OF DISCHARGE:                              OPERATIVE REPORT   PREOPERATIVE DIAGNOSES:  Left shoulder rotator cuff tear and superior labrum anterior and posterior lesion.  Acromioclavicular joint arthritis.  POSTOPERATIVE DIAGNOSES:  Left shoulder rotator cuff tear and superior labrum anterior and posterior lesion.  Acromioclavicular joint arthritis.  PROCEDURE PERFORMED:  Left shoulder arthroscopy with extensive intra- articular debridement of torn superior labrum, anterior-posterior arthroscopic biceps tenotomy, arthroscopic debridement of undersurface partial thickness rotator cuff tear, and partial-thickness subscapularis tear, arthroscopic subacromial decompression, followed by mini-open rotator cuff repair and biceps tenodesis in the groove, and open distal clavicle resection.  ATTENDING SURGEON:  Almedia Balls. Ranell Patrick, M.D.  ASSISTANT:  Donnie Coffin. Dixon, PA-C who scrubbed the entire procedure and necessary for satisfactory completion of surgery.  ANESTHESIA:  General anesthesia was used plus interscalene block.  ESTIMATED BLOOD LOSS:  Minimal.  FLUID REPLACEMENT:  1500 mL of crystalloid.  INSTRUMENT COUNTS:  Correct.  COMPLICATIONS:  There were no complications.  ANTIBIOTICS:  Perioperative antibiotics were given.  INDICATIONS:  The patient is a 62 year old male with a history of worsening left shoulder pain secondary to MRI documented superior labral tear anterior posterior.  The patient also has what looks like a partial- thickness rotator cuff tear, impingement, and advanced AC arthritis. The patient has failed all measures of conservative management.  Desires operative treatment to restore function in his left  shoulder and eliminate pain.  Informed consent obtained.  DESCRIPTION OF PROCEDURE:  After an adequate level of anesthesia was achieved, the patient was positioned in modified beach-chair position. Left shoulder correctly identified.  Time-out called.  Sterile prep and drape performed.  We entered the shoulder using standard portals including anterior, posterior, and lateral portals.  Then found significant synovitis and inflammation within the shoulder, we identified a torn superior labrum with an unstable biceps anchor. Performed a biceps tenotomy and labral debrided back to stable labral rim.  Anterior inferior, posterior inferior labrum intact.  Articular cartilage normal.  Subscap upper surface partial-thickness tear representing less than 25% thickness of the tendon.  We performed a debridement of that using suction shaver including scuffing at the lesser tuberosity to encourage healing the supraspinatus.  Had a partial- thickness undersurface tear that extended into the infraspinatus and this appeared to be less than 25% thickness of the tendon.  We looked at the teres minor that looked normal.  We placed a scope in subacromial space.  Thorough bursectomy and acromioplasty was performed creating a type 1 acromial shape with resection, but not released the CA ligament. We had nice decompression of the rotator cuff outlet, we were careful not to go too far posteriorly for the os acromiale that was present. Finally we inspected the rotator cuff.  I was able to palpate a soft spot in the area of the supraspinatus infraspinatus junction with the shaver concerning for an internal or interstitial tear.  At this point, we concluded  the arthroscopic portion of the procedure,  we then went ahead and made a saber incision overlying the AC joint.  Dissection down to subcutaneous tissues using the needle-tip Bovie.  Identified deltotrapezial fascia, divided in line with distal  clavicle. Subperiosteal dissection of distal clavicle performed followed by excision of distal 2 mm of bone with an oscillating saw.  We thoroughly irrigated the AC interval.  We removed the dorsal capsule and the cartilage meniscus that was in the joint and had a nice decompression of the joint.  We thoroughly irrigated out and applied bone wax to the cut in the clavicle.  We then repaired deltotrapezial fascia with interrupted 0-Vicryl suture followed by 2-0 Vicryl subcutaneous closure and 4-0 Monocryl for skin.  We then addressed the biceps and rotator cuff tear through a single mini-open incision starting at the anterolateral border of the acromion staying distally about 3-4 cm. Dissection down to subcutaneous tissues using needle-tip Bovie, identified the deltoid raphe between the internal heads of the deltoid. We divided that using the needle-tip Bovie.  We placed Arthrex retractor, first identified the biceps tendon, delivered that tendon out of the soft tissue groove, prepared the floor of the biceps groove with Bovie and then a Freer elevator scuffing that up to encourage healing. We then placed a single Y-Knot anchor by [employer] through the groove, brought that up in a whipstitch fashion through the tendon with mid tension elbow at 90 degrees tenodesing that down nice and flush to the biceps groove.  We clipped away the remaining portion of tendon.  We then over sewed the tendon and the soft tissue distal to our suture anchor with 0-Vicryl suture.  Next, we went ahead and addressed the rotator cuff tear.  I could palpate just posterior to the supraspinatus that there was a damaged area, incised the rotator cuff tendon along with its fibers, immediately encountering degenerative and damaged tissue.  We cleaned that out using a knife as well as rongeur and curette.  Compared the footprint for repair, placed a single Y-Knot anchor, this was Y-Knot 1.6 flex anchor to the  floor of the rotator cuff footprint, brought that suture up in a mattress fashion doing a side-to- side repair.  Also did #2 hi-fi side-to-side x2 with some additional 0- Vicryl, had nice anatomic repair not under tension, no gapping with range of motion.  We thoroughly irrigated the subdeltoid interval and repaired deltoid anatomically with 0-Vicryl suture followed by 2-0 Vicryl subcutaneous closure and 4-0 Monocryl for skin and portals. Steri-Strips applied followed by sterile dressing.     Almedia Balls. Ranell Patrick, M.D.     SRN/MEDQ  D:  01/23/2013  T:  01/23/2013  Job:  [id number]

## 2013-01-23 NOTE — Transfer of Care (Signed)
Immediate Anesthesia Transfer of Care Note  Patient: [name]  Procedure(s) Performed: Procedure(s): LEFT SHOULDER ARTHROSCOPY WITH SUBACROMIAL DECOMPRESSION AND MINI OPEN ROTATOR CUFF REPAIR, AND OPEN DISTAL CLAVICLE RESECTION (Left)  Patient Location: PACU  Anesthesia Type:General  Level of Consciousness: awake, alert  and oriented  Airway & Oxygen Therapy: Patient Spontanous Breathing and Patient connected to nasal cannula oxygen  Post-op Assessment: Report given to PACU RN and Post -op Vital signs reviewed and stable  Post vital signs: Reviewed and stable  Complications: No apparent anesthesia complications

## 2013-01-23 NOTE — Preoperative (Signed)
Beta Blockers   Reason not to administer Beta Blockers:Not Applicable 

## 2013-01-23 NOTE — Progress Notes (Signed)
UR COMPLETED  

## 2013-01-23 NOTE — Brief Op Note (Signed)
01/23/2013  11:42 AM  PATIENT:  [name]  62 y.o. 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, SLAP lesion  PROCEDURE:  Procedure(s): LEFT SHOULDER ARTHROSCOPY WITH SUBACROMIAL DECOMPRESSION AND MINI OPEN ROTATOR CUFF REPAIR, AND OPEN DISTAL CLAVICLE RESECTION (Left), extensive debridement of SLAP and biceps tenodesis  SURGEON:  Surgeon(s) and Role:    * Verlee Rossetti, MD - Primary  PHYSICIAN ASSISTANT:   ASSISTANTS: Thea Gist, PA-C   ANESTHESIA:   regional and general  EBL:  Total I/O In: 1000 [I.V.:1000] Out: -   BLOOD ADMINISTERED:none  DRAINS: none   LOCAL MEDICATIONS USED:  MARCAINE     SPECIMEN:  No Specimen  DISPOSITION OF SPECIMEN:  N/A  COUNTS:  YES  TOURNIQUET:  * No tourniquets in log *  DICTATION: .Other Dictation: Dictation Number [id number]  PLAN OF CARE: Admit for overnight observation  PATIENT DISPOSITION:  PACU - hemodynamically stable.   Delay start of Pharmacological VTE agent (>24hrs) due to surgical blood loss or risk of bleeding: not applicable

## 2013-01-23 NOTE — Anesthesia Procedure Notes (Addendum)
Anesthesia Regional Block:  Interscalene brachial plexus block  Pre-Anesthetic Checklist: ,, timeout performed, Correct Patient, Correct Site, Correct Laterality, Correct Procedure, Correct Position, site marked, Risks and benefits discussed, pre-op evaluation,  At surgeon's request and post-op pain management  Laterality: Left  Prep: Maximum Sterile Barrier Precautions used and chloraprep       Needles:  Injection technique: Single-shot  Needle Type: Echogenic Stimulator Needle      Needle Gauge: 22 and 22 G    Additional Needles:  Procedures: ultrasound guided (picture in chart) and nerve stimulator Interscalene brachial plexus block  Nerve Stimulator or Paresthesia:  Response: Biceps response, 0.4 mA,   Additional Responses:   Narrative:  Start time: 01/23/2013 8:45 AM End time: 01/23/2013 8:58 AM Injection made incrementally with aspirations every 5 mL. Anesthesiologist: Sampson Goon, MD  Additional Notes: 2% Lidocaine skin wheel.   Interscalene brachial plexus block Procedure Name: Intubation Date/Time: 01/23/2013 9:48 AM Performed by: Margaree Mackintosh Pre-anesthesia Checklist: Patient identified, Timeout performed, Emergency Drugs available, Suction available and Patient being monitored Patient Re-evaluated:Patient Re-evaluated prior to inductionOxygen Delivery Method: Circle system utilized Preoxygenation: Pre-oxygenation with 100% oxygen Intubation Type: IV induction Ventilation: Mask ventilation without difficulty and Oral airway inserted - appropriate to patient size Laryngoscope Size: Mac and 4 Grade View: Grade II Tube type: Oral Tube size: 7.5 mm Number of attempts: 1 Airway Equipment and Method: Stylet Placement Confirmation: ETT inserted through vocal cords under direct vision,  positive ETCO2 and breath sounds checked- equal and bilateral Secured at: 23 cm Tube secured with: Tape Dental Injury: Injury to lip  Comments: Small abrasion on left upper  lip

## 2013-01-23 NOTE — Progress Notes (Signed)
Orthopedic Tech Progress Note Patient Details:  [name] [birth year] [id number] Shoulder abd. Pillow delivered to OR desk Ortho Devices Type of Ortho Device: Shoulder abduction pillow Ortho Device/Splint Interventions: Ordered   [place] R Thompson 01/23/2013, 12:17 PM

## 2013-01-23 NOTE — Anesthesia Postprocedure Evaluation (Signed)
  Anesthesia Post-op Note  Patient: [name]  Procedure(s) Performed: Procedure(s): LEFT SHOULDER ARTHROSCOPY WITH SUBACROMIAL DECOMPRESSION AND MINI OPEN ROTATOR CUFF REPAIR, AND OPEN DISTAL CLAVICLE RESECTION (Left)  Patient Location: PACU  Anesthesia Type:GA combined with regional for post-op pain  Level of Consciousness: awake and alert   Airway and Oxygen Therapy: Patient Spontanous Breathing  Post-op Pain: mild  Post-op Assessment: Post-op Vital signs reviewed, Patient's Cardiovascular Status Stable, Respiratory Function Stable, Patent Airway and No signs of Nausea or vomiting  Post-op Vital Signs: Reviewed and stable  Complications: No apparent anesthesia complications

## 2013-01-23 NOTE — Anesthesia Preprocedure Evaluation (Addendum)
Anesthesia Evaluation  Patient identified by MRN, date of birth, ID band Patient awake    Reviewed: Allergy & Precautions, H&P , NPO status , Patient's Chart, lab work & pertinent test results  Airway Mallampati: I TM Distance: >3 FB Neck ROM: Full    Dental no notable dental hx. (+) Teeth Intact and Dental Advisory Given   Pulmonary sleep apnea ,  breath sounds clear to auscultation  Pulmonary exam normal       Cardiovascular hypertension, On Medications Rhythm:Regular Rate:Normal     Neuro/Psych PSYCHIATRIC DISORDERS negative neurological ROS     GI/Hepatic negative GI ROS, Neg liver ROS, GERD-  ,  Endo/Other  diabetes, Type 2, Oral Hypoglycemic AgentsMorbid obesity  Renal/GU negative Renal ROS  negative genitourinary   Musculoskeletal   Abdominal   Peds  Hematology negative hematology ROS (+)   Anesthesia Other Findings   Reproductive/Obstetrics negative OB ROS                          Anesthesia Physical Anesthesia Plan  ASA: III  Anesthesia Plan: General and Regional   Post-op Pain Management:    Induction: Intravenous  Airway Management Planned: Oral ETT  Additional Equipment:   Intra-op Plan:   Post-operative Plan: Extubation in OR  Informed Consent: I have reviewed the patients History and Physical, chart, labs and discussed the procedure including the risks, benefits and alternatives for the proposed anesthesia with the patient or authorized representative who has indicated his/her understanding and acceptance.   Dental advisory given  Plan Discussed with: CRNA  Anesthesia Plan Comments:         Anesthesia Quick Evaluation

## 2013-01-24 LAB — CBC
HCT: 36.9 % — ABNORMAL LOW (ref 39.0–52.0)
MCH: 31 pg (ref 26.0–34.0)
MCHC: 35.5 g/dL (ref 30.0–36.0)
MCV: 87.4 fL (ref 78.0–100.0)
Platelets: 252 10*3/uL (ref 150–400)
RDW: 15 % (ref 11.5–15.5)
WBC: 12 10*3/uL — ABNORMAL HIGH (ref 4.0–10.5)

## 2013-01-24 LAB — GLUCOSE, CAPILLARY: Glucose-Capillary: 216 mg/dL — ABNORMAL HIGH (ref 70–99)

## 2013-01-24 LAB — BASIC METABOLIC PANEL
BUN: 8 mg/dL (ref 6–23)
Calcium: 8.3 mg/dL — ABNORMAL LOW (ref 8.4–10.5)
Chloride: 102 mEq/L (ref 96–112)
Creatinine, Ser: 0.8 mg/dL (ref 0.50–1.35)
GFR calc Af Amer: >90 mL/min (ref >90)
GFR calc non Af Amer: >90 mL/min (ref >90)
Potassium: 3.6 mEq/L (ref 3.5–5.1)

## 2013-01-24 NOTE — Evaluation (Signed)
Occupational Therapy Evaluation Patient Details Name: [name] MRN: [id number] DOB: Mar 03, 1951 Today's Date: 01/24/2013 Time: 7829-5621 OT Time Calculation (min): 27 min  OT Assessment / Plan / Recommendation History of present illness s/p LEFT SHOULDER ARTHROSCOPY WITH SUBACROMIAL DECOMPRESSION AND MINI OPEN ROTATOR CUFF REPAIR, AND OPEN DISTAL CLAVICLE RESECTION (Left), extensive debridement of SLAP and biceps tenodesis   Clinical Impression   Pt admitted with above. Will continue to follow acutely in order to address below problem list.  Recommend progress rehab of shoulder as recommended by MD at f/u visit. Pt has good support from daughter at home.   OT Assessment  Patient needs continued OT Services    Follow Up Recommendations   (progress rehab of shoulder as recommended by MD at f/u visit)    Barriers to Discharge      Equipment Recommendations  None recommended by OT    Recommendations for Other Services    Frequency  Min 2X/week    Precautions / Restrictions Precautions Precautions: Shoulder Type of Shoulder Precautions: AAROM within limits of pain. Precaution Booklet Issued: Yes (comment) Required Braces or Orthoses: Sling   Pertinent Vitals/Pain 8/10 left shoulder pain    ADL  Eating/Feeding: Performed;Modified independent Where Assessed - Eating/Feeding: Edge of bed;Chair Grooming: Performed;Wash/dry hands;Modified independent Where Assessed - Grooming: Unsupported standing Upper Body Bathing: Simulated;Modified independent Where Assessed - Upper Body Bathing: Unsupported sitting Lower Body Bathing: Simulated;Modified independent Where Assessed - Lower Body Bathing: Unsupported sit to stand Upper Body Dressing: Performed;Minimal assistance (sling) Where Assessed - Upper Body Dressing: Unsupported sitting Lower Body Dressing: Performed;Modified independent Where Assessed - Lower Body Dressing: Unsupported sit to stand Toilet Transfer:  Simulated;Modified independent Toilet Transfer Method: Sit to Barista:  (simulated at bed) Toileting - Architect and Hygiene: Simulated;Modified independent Where Assessed - Toileting Clothing Manipulation and Hygiene: Sit to stand from 3-in-1 or toilet Equipment Used:  (left shoulder sling) Transfers/Ambulation Related to ADLs: mod I ADL Comments: Pt ambulating around room on OT arrival. Pt too painful to perform pendulum exercises due to pain. Educated and demonstrated pendulums, and pt verbalized being familiar with pendulum exercises due to past Right shoulder surgery.       OT Diagnosis: Generalized weakness;Acute pain  OT Problem List: Decreased strength;Decreased activity tolerance;Pain;Impaired UE functional use OT Treatment Interventions: Self-care/ADL training;Therapeutic exercise;Patient/family education   OT Goals(Current goals can be found in the care plan section) Acute Rehab OT Goals Patient Stated Goal: to be able to cook again OT Goal Formulation: With patient Time For Goal Achievement: 01/31/13 Potential to Achieve Goals: Good  Visit Information  Last OT Received On: 01/24/13 Assistance Needed: +1 History of Present Illness: s/p LEFT SHOULDER ARTHROSCOPY WITH SUBACROMIAL DECOMPRESSION AND MINI OPEN ROTATOR CUFF REPAIR, AND OPEN DISTAL CLAVICLE RESECTION (Left), extensive debridement of SLAP and biceps tenodesis       Prior Functioning     Home Living Family/patient expects to be discharged to:: Private residence Living Arrangements: Alone Available Help at Discharge: Family;Available PRN/intermittently Type of Home: House Home Layout: One level Home Equipment: None Additional Comments: Daughter is able to provide as much assist as necessary, Pt states that he can stay with her for a few days if needed. She will be helping him with home setup and meals during recovery. Prior Function Level of Independence:  Independent Communication Communication: No difficulties Dominant Hand: Right         Vision/Perception     Cognition  Cognition Arousal/Alertness: Awake/alert Behavior During Therapy:  WFL for tasks assessed/performed Overall Cognitive Status: Within Functional Limits for tasks assessed    Extremity/Trunk Assessment Upper Extremity Assessment Upper Extremity Assessment: LUE deficits/detail LUE Deficits / Details: Hand, wrist and elbow AROM WFL.  Shoulder AAROM ~50. ER ~30. LUE: Unable to fully assess due to pain     Mobility Bed Mobility Bed Mobility: Not assessed (pt sitting EOB) Transfers Transfers: Sit to Stand;Stand to Sit Sit to Stand: 6: Modified independent (Device/Increase time);From bed;From chair/3-in-1 Stand to Sit: 6: Modified independent (Device/Increase time);To bed;To chair/3-in-1     Exercise General Exercises - Upper Extremity Elbow Flexion: AROM;Left;10 reps Elbow Extension: AROM;Left;10 reps Wrist Flexion: AROM;Left;10 reps Wrist Extension: AROM;Left;10 reps Digit Composite Flexion: AROM;Left;10 reps Composite Extension: AROM;Left;10 reps Donning/doffing sling/immobilizer: Minimal assistance Correct positioning of sling/immobilizer: Supervision/safety ROM for elbow, wrist and digits of operated UE: Independent   Balance     End of Session OT - End of Session Activity Tolerance: Patient limited by pain Patient left: in chair;with call bell/phone within reach Nurse Communication: Mobility status  GO Functional Assessment Tool Used: clinical judgement Functional Limitation: Self care Self Care Current Status (O9629): At least 1 percent but less than 20 percent impaired, limited or restricted Self Care Goal Status [phone]): 0 percent impaired, limited or restricted  01/24/2013 Cipriano Mile OTR/L Pager [phone] Office [phone]  Cipriano Mile 01/24/2013, 11:20 AM

## 2013-01-24 NOTE — Progress Notes (Signed)
Discharge teaching completed. Patient verbalized understanding. Patient to discharge home accompanied by daughter.

## 2013-01-27 ENCOUNTER — Encounter (HOSPITAL_COMMUNITY): Payer: Self-pay | Admitting: Orthopedic Surgery

## 2013-01-27 NOTE — Discharge Summary (Signed)
Physician Discharge Summary   Patient ID: [name] MRN: [id number] DOB/AGE: April 28, 1950 62 y.o.  Admit date: 01/23/2013 Discharge date: 01/27/2013  Admission Diagnoses:  Left shoulder rotator cuff tear  Discharge Diagnoses:  Same   Surgeries: Procedure(s): LEFT SHOULDER ARTHROSCOPY WITH SUBACROMIAL DECOMPRESSION AND MINI OPEN ROTATOR CUFF REPAIR, AND OPEN DISTAL CLAVICLE RESECTION on 01/23/2013   Consultants: PT/OT  Discharged Condition: Stable  Hospital Course: [name] is an 62 y.o. male who was admitted 01/23/2013 with a chief complaint of No chief complaint on file. , and found to have a diagnosis of <principal problem not specified>.  They were brought to the operating room on 01/23/2013 and underwent the above named procedures.    The patient had an uncomplicated hospital course and was stable for discharge.  Recent vital signs:  Filed Vitals:   01/24/13 1414  BP: 157/94  Pulse: 95  Temp: 99.5 F (37.5 C)  Resp: 18    Recent laboratory studies:  Results for orders placed during the hospital encounter of 01/23/13  GLUCOSE, CAPILLARY      Result Value Range   Glucose-Capillary 158 (*) 70 - 99 mg/dL  GLUCOSE, CAPILLARY      Result Value Range   Glucose-Capillary 148 (*) 70 - 99 mg/dL   Comment 1 Notify RN    CBC      Result Value Range   WBC 12.0 (*) 4.0 - 10.5 K/uL   RBC 4.22  4.22 - 5.81 MIL/uL   Hemoglobin 13.1  13.0 - 17.0 g/dL   HCT 78.4 (*) 69.6 - 29.5 %   MCV 87.4  78.0 - 100.0 fL   MCH 31.0  26.0 - 34.0 pg   MCHC 35.5  30.0 - 36.0 g/dL   RDW 28.4  13.2 - 44.0 %   Platelets 252  150 - 400 K/uL  BASIC METABOLIC PANEL      Result Value Range   Sodium 138  135 - 145 mEq/L   Potassium 3.6  3.5 - 5.1 mEq/L   Chloride 102  96 - 112 mEq/L   CO2 23  19 - 32 mEq/L   Glucose, Bld 217 (*) 70 - 99 mg/dL   BUN 8  6 - 23 mg/dL   Creatinine, Ser 1.02  0.50 - 1.35 mg/dL   Calcium 8.3 (*) 8.4 - 10.5 mg/dL   GFR calc non Af Amer >90  >90 mL/min     GFR calc Af Amer >90  >90 mL/min  GLUCOSE, CAPILLARY      Result Value Range   Glucose-Capillary 116 (*) 70 - 99 mg/dL   Comment 1 Notify RN    GLUCOSE, CAPILLARY      Result Value Range   Glucose-Capillary 132 (*) 70 - 99 mg/dL  GLUCOSE, CAPILLARY      Result Value Range   Glucose-Capillary 216 (*) 70 - 99 mg/dL  GLUCOSE, CAPILLARY      Result Value Range   Glucose-Capillary 190 (*) 70 - 99 mg/dL   Comment 1 Documented in Chart     Comment 2 Notify RN      Discharge Medications:     Medication List         aspirin EC 81 MG tablet  Take 81 mg by mouth daily.     atorvastatin 10 MG tablet  Commonly known as:  LIPITOR  Take 1 tablet (10 mg total) by mouth daily.     baclofen 10 MG tablet  Commonly known as:  LIORESAL  Take 10 mg by mouth at bedtime as needed (for back spasms).     glucose blood test strip  Commonly known as:  ONE TOUCH ULTRA TEST  TEST BLOOD SUGAR TWICE DAILY AS DIRECTED.  250.02     lisinopril 10 MG tablet  Commonly known as:  PRINIVIL,ZESTRIL  Take 0.5 tablets (5 mg total) by mouth daily.     metFORMIN 1000 MG tablet  Commonly known as:  GLUCOPHAGE  Take 1 tablet (1,000 mg total) by mouth 2 (two) times daily with a meal.     methocarbamol 500 MG tablet  Commonly known as:  ROBAXIN  Take 1 tablet (500 mg total) by mouth 3 (three) times daily as needed.     oxyCODONE-acetaminophen 10-325 MG per tablet  Commonly known as:  PERCOCET  Take 1-2 tablets by mouth every 4 (four) hours as needed for pain.     oxyCODONE-acetaminophen 10-325 MG per tablet  Commonly known as:  PERCOCET  Take 1 tablet by mouth every 4 (four) hours as needed for pain.     tamsulosin 0.4 MG Caps capsule  Commonly known as:  FLOMAX  Take 0.4 mg by mouth daily after supper.        Diagnostic Studies: No results found.  Disposition: 01-Home or Self Care      Discharge Orders   Future Orders Complete By Expires   Call MD / Call 911  As directed    Comments:      If you experience chest pain or shortness of breath, CALL 911 and be transported to the hospital emergency room.  If you develope a fever above 101 F, pus (white drainage) or increased drainage or redness at the wound, or calf pain, call your surgeon's office.   Constipation Prevention  As directed    Comments:     Drink plenty of fluids.  Prune juice may be helpful.  You may use a stool softener, such as Colace (over the counter) 100 mg twice a day.  Use MiraLax (over the counter) for constipation as needed.   Diet - low sodium heart healthy  As directed    Increase activity slowly as tolerated  As directed       Follow-up Information   Follow up with NORRIS,STEVEN R, MD. Schedule an appointment as soon as possible for a visit in 2 weeks. [phone])    Specialty:  Orthopedic Surgery   Contact information:   [address] [place] [address] [phone]        Signed: Thea Gist 01/27/2013, 2:45 PM

## 2013-02-18 ENCOUNTER — Ambulatory Visit: Payer: Medicare Other

## 2013-02-20 ENCOUNTER — Ambulatory Visit (INDEPENDENT_AMBULATORY_CARE_PROVIDER_SITE_OTHER): Payer: Medicare Other

## 2013-02-20 DIAGNOSIS — Z23 Encounter for immunization: Secondary | ICD-10-CM

## 2013-03-18 ENCOUNTER — Encounter (HOSPITAL_COMMUNITY): Payer: Self-pay | Admitting: Emergency Medicine

## 2013-03-18 ENCOUNTER — Telehealth: Payer: Self-pay | Admitting: Family Medicine

## 2013-03-18 ENCOUNTER — Emergency Department (INDEPENDENT_AMBULATORY_CARE_PROVIDER_SITE_OTHER)
Admission: EM | Admit: 2013-03-18 | Discharge: 2013-03-18 | Disposition: A | Discharge status: Home / Self Care | Payer: Medicare Other | Source: Home / Self Care | Attending: Emergency Medicine | Admitting: Emergency Medicine

## 2013-03-18 DIAGNOSIS — N419 Inflammatory disease of prostate, unspecified: Secondary | ICD-10-CM

## 2013-03-18 LAB — POCT URINALYSIS DIP (DEVICE)
Hgb urine dipstick: NEGATIVE
Ketones, ur: NEGATIVE mg/dL
Leukocytes, UA: NEGATIVE
Nitrite: NEGATIVE
Protein, ur: NEGATIVE mg/dL
Urobilinogen, UA: 0.2 mg/dL (ref 0.0–1.0)
pH: 7 (ref 5.0–8.0)

## 2013-03-18 LAB — POCT PREGNANCY, URINE: Preg Test, Ur: NEGATIVE

## 2013-03-18 MED ORDER — CIPROFLOXACIN HCL 500 MG PO TABS: 500.0000 mg | ORAL_TABLET | Freq: Two times a day (BID) | ORAL | Status: DC

## 2013-03-18 NOTE — ED Provider Notes (Signed)
Chief Complaint:   Chief Complaint  Patient presents with  . Dysuria    History of Present Illness:   [name] is a 62 year old male who presents with a history since 3:30 PM today of dysuria with burning pain on urination. He thinks his stream is normal, there was no blood in the urine, and he denies any back pain today. He had back pain last week and today has some pain in his right leg. He's had chills, sweats, and subjective fever. He's urinated twice subsequently since the episode at 3:30 and had only mild burning. He does not have a prior history of urinary tract infections, stones, or prostatitis. He does have a prostate nodule. He has diabetes, hypertension, and hypercholesterolemia. His blood sugars have been under good control.   Review of Systems:  Other than noted above, the patient denies any of the following symptoms: General:  No fevers, chills, sweats, aches, or fatigue. GI:  No abdominal pain, back pain, nausea, vomiting, diarrhea, or constipation. GU:  No dysuria, frequency, urgency, hematuria, urethral discharge, penile lesions, penile pain, testicular pain, swelling, or mass, inguinal lymphadenopathy or incontinence.  PMFSH:  Past medical history, family history, social history, meds, and allergies were reviewed.  He has no medication allergies. Current meds include aspirin, Lipitor, lisinopril, metformin, Flomax, baclofen, Robaxin, and Percocet. He has a history of gastroesophageal reflux. Hyperlipidemia, hypertension, anxiety, depression, TIAs, type 2 diabetes, esophageal stricture, epididymitis, back pain, prostatitis, hematuria, and sleep apnea.  Physical Exam:   Vital signs:  BP 143/90  Pulse 91  Temp(Src) 97.8 F (36.6 C) (Oral)  Resp 14  SpO2 98% Gen:  Alert, oriented, in no distress. Lungs:  Clear to auscultation, no wheezes, rales or rhonchi. Heart:  Regular rhythm, no gallop or murmer. Abdomen:  Flat and soft.  No tenderness to palpation, guarding, or  rebound.  No hepato-splenomegaly or mass.  Bowel sounds were normally active.  No hernia. Genital exam:  Normal exam. No urethral discharge. No penile lesions. Testes are mildly tender but not swollen and without any nodules. Rectal exam:  Digital rectal exam reveals prostate to be normal in size, shape, and consistency without any nodules. There was moderate tenderness to palpation. Back:  No CVA tenderness.  Skin:  Clear, warm and dry.  Labs:   Results for orders placed during the hospital encounter of 03/18/13  POCT PREGNANCY, URINE      Result Value Range   Preg Test, Ur NEGATIVE  NEGATIVE  POCT URINALYSIS DIP (DEVICE)      Result Value Range   Glucose, UA NEGATIVE  NEGATIVE mg/dL   Bilirubin Urine NEGATIVE  NEGATIVE   Ketones, ur NEGATIVE  NEGATIVE mg/dL   Specific Gravity, Urine 1.020  1.005 - 1.030   Hgb urine dipstick NEGATIVE  NEGATIVE   pH 7.0  5.0 - 8.0   Protein, ur NEGATIVE  NEGATIVE mg/dL   Urobilinogen, UA 0.2  0.0 - 1.0 mg/dL   Nitrite NEGATIVE  NEGATIVE   Leukocytes, UA NEGATIVE  NEGATIVE   A urine culture was obtained.   Assessment: The encounter diagnosis was Prostatitis.   He has mild prostatitis. There is no evidence of urinary retention. No evidence of acute pyelonephritis.  Plan:   1.  Meds:  The following meds were prescribed:   New Prescriptions   CIPROFLOXACIN (CIPRO) 500 MG TABLET    Take 1 tablet (500 mg total) by mouth every 12 (twelve) hours.    2.  Patient Education/Counseling:  The  patient was given appropriate handouts, self care instructions, and instructed in symptomatic relief.  He should finish up his antibiotics and followup with his primary care physician in 2 weeks.  3.  Follow up:  The patient was told to follow up if no better in 3 to 4 days, if becoming worse in any way, and given some red flag symptoms such as high fever, pain, or difficulty urinating which would prompt immediate return.  Follow up with his primary care physician in 2  weeks, or sooner here if he has any problems.      Reuben Likes, MD 03/18/13 2026

## 2013-03-18 NOTE — Telephone Encounter (Signed)
Message from Geanie Berlin sent at 03/18/2013 4:45 PM ----- Lorain Childes- advised UC since after 1600 and no appts remain in office.

## 2013-03-18 NOTE — Telephone Encounter (Signed)
Patient Information:  Caller Name: Gannon  Phone: [phone]  Patient: [name], [name]  Gender: Male  DOB: Feb 05, 1951  Age: 62 Years  PCP: Eustaquio Boyden [facility])  Office Follow Up:  Does the office need to follow up with this patient?: No  Instructions For The Office: N/A  RN Note:  No appointments remain in office for 03/18/13; referred to [facility].  Symptoms  Reason For Call & Symptoms: Dysuria during and after urination.  FBS 145 at 0700.  Reviewed Health History In EMR: Yes  Reviewed Medications In EMR: Yes  Reviewed Allergies In EMR: Yes  Reviewed Surgeries / Procedures: Yes  Date of Onset of Symptoms: 03/18/2013  Guideline(s) Used:  Urination Pain - Male  Disposition Per Guideline:   Go to Office Now  Reason For Disposition Reached:   Severe pain with urination  Advice Given:  Fluids  : Drink extra fluids (Reason: to produce a dilute, nonirritating urine).  Call Back If:  You become worse.  Patient Will Follow Care Advice:  YES

## 2013-03-18 NOTE — ED Notes (Signed)
Onset today of pain w urination, SOB due to pain; denies pain at present, NAD at present

## 2013-03-18 NOTE — Telephone Encounter (Signed)
Agreed -

## 2013-03-20 LAB — OCCULT BLOOD, POC DEVICE: Fecal Occult Bld: NEGATIVE

## 2013-04-20 ENCOUNTER — Encounter: Payer: Self-pay | Admitting: Family Medicine

## 2013-04-20 ENCOUNTER — Ambulatory Visit (INDEPENDENT_AMBULATORY_CARE_PROVIDER_SITE_OTHER): Payer: Medicare Other | Admitting: Family Medicine

## 2013-04-20 VITALS — BP 132/68 | HR 116 | Temp 97.8°F | Ht 68.5 in | Wt 234.0 lb

## 2013-04-20 DIAGNOSIS — R1030 Lower abdominal pain, unspecified: Secondary | ICD-10-CM | POA: Insufficient documentation

## 2013-04-20 DIAGNOSIS — R109 Unspecified abdominal pain: Secondary | ICD-10-CM

## 2013-04-20 DIAGNOSIS — R300 Dysuria: Secondary | ICD-10-CM

## 2013-04-20 LAB — BASIC METABOLIC PANEL
BUN: 12 mg/dL (ref 6–23)
Calcium: 9 mg/dL (ref 8.4–10.5)
Creatinine, Ser: 1 mg/dL (ref 0.4–1.5)
GFR: 93.75 mL/min (ref >60.00)
Glucose, Bld: 140 mg/dL — ABNORMAL HIGH (ref 70–99)
Potassium: 4.2 mEq/L (ref 3.5–5.1)

## 2013-04-20 LAB — POCT URINALYSIS DIPSTICK
Blood, UA: NEGATIVE
Glucose, UA: NEGATIVE
Ketones, UA: NEGATIVE
Nitrite, UA: NEGATIVE
Spec Grav, UA: 1.025
Urobilinogen, UA: 0.2
pH, UA: 6

## 2013-04-20 LAB — CBC WITH DIFFERENTIAL/PLATELET
Basophils Absolute: 0 10*3/uL (ref 0.0–0.1)
Eosinophils Absolute: 0.2 10*3/uL (ref 0.0–0.7)
Eosinophils Relative: 1.9 % (ref 0.0–5.0)
Hemoglobin: 14.8 g/dL (ref 13.0–17.0)
Lymphocytes Relative: 21.4 % (ref 12.0–46.0)
MCHC: 33.4 g/dL (ref 30.0–36.0)
Neutro Abs: 8.1 10*3/uL — ABNORMAL HIGH (ref 1.4–7.7)
Neutrophils Relative %: 70.6 % (ref 43.0–77.0)
Platelets: 306 10*3/uL (ref 150.0–400.0)
RDW: 14.5 % (ref 11.5–14.6)

## 2013-04-20 LAB — HEMOCCULT GUIAC POC 1CARD (OFFICE): Fecal Occult Blood, POC: NEGATIVE

## 2013-04-20 MED ORDER — CIPROFLOXACIN HCL 500 MG PO TABS: 500.0000 mg | ORAL_TABLET | Freq: Two times a day (BID) | ORAL | Status: DC

## 2013-04-20 NOTE — Patient Instructions (Signed)
I do think you have a prostate infection.  Treat with prolonged cipro course for next month (twice daily for 1 month). Make sure you're drinking 6-8 8 oz glasses of water daily. Blood work today. Let us know if any worsening or symptoms persist.  Prostatitis The prostate gland is about the size and shape of a walnut. It is located just below your bladder. It produces one of the components of semen, which is made up of sperm and the fluids that help nourish and transport it out from the testicles. Prostatitis is redness, soreness, and swelling (inflammation) of the prostate gland.  There are 3 types of prostatitis:  Acute bacterial prostatitis This is the least common type of prostatitis. It starts quickly and usually leads to a bladder infection. It can occur at any age.  Chronic bacterial prostatitis This is a persistent bacterial infection in the prostate. It usually develops from repeated acute bacterial prostatitis or acute bacterial prostatitis that was not properly treated. It can occur in men of any age but is most common in middle-aged men whose prostate has begun to enlarge.   Chronic prostatitis chronic pelvic pain syndrome This is the most common type of prostatitis. It is inflammation of the prostate gland that is not caused by a bacterial infection. The cause is unknown. CAUSES The cause of acute and chronic bacterial prostatitis is a bacterial infection. The exact cause of chronic prostatitis and chronic pelvic pain syndrome and asymptomatic inflammatory prostatitis is unknown.  SYMPTOMS  Symptoms can vary depending upon the type of prostatitis that exists. There can also be overlap in symptoms. Possible symptoms for each type of prostatitis are listed below. Acute bacterial prostatitis  Painful urination.  Fever or chills.  Muscle or joint pains.  Low back pain.  Low abdominal pain.  Inability to empty bladder completely.  Sudden urge to urinate.  Frequent  urination.  Difficulty starting urine stream.  Weak urine stream.  Discharge from the urethra.  Dribbling after urination.  Rectal pain.  Pain in the testicles, penis, or tip of the penis.  Pain in the space between the anus and scrotum (perineum).  Problems with sexual function.  Painful ejaculation.  Bloody semen. Chronic bacterial prostatitis  The symptoms are similar to those of acute bacterial prostatitis, but they usually are much less severe. Fever, chills, and muscle and joint pain are not associated with chronic bacterial prostatitis. Chronic prostatitis chronic pelvic pain syndrome  Symptoms typically include a dull ache in the scrotum and the perineum. DIAGNOSIS  In order to diagnose prostatitis, your caregiver will ask about your symptoms. If acute or chronic bacterial prostatitis is suspected, a urine sample will be taken and tested (urinalysis). This is to see if there is bacteria in your urine. If the urinalysis result is negative for bacteria, your caregiver may use a finger to feel your prostate (digital rectal exam). This exam helps your caregiver determine if your prostate is swollen and tender. TREATMENT  Treatment for prostatitis depends on the cause. If a bacterial infection is the cause, it can be treated with antibiotic medicine. In cases of chronic bacterial prostatitis, the use of antibiotics for up to 1 month may be necessary. Your caregiver may instruct you to take sitz baths to help relieve pain. A sitz bath is a bath of hot water in which your hips and buttocks are under water. HOME CARE INSTRUCTIONS   Take all medicines as directed by your caregiver.  Take sitz baths as directed by  your caregiver. SEEK MEDICAL CARE IF:   Your symptoms get worse, not better.  You have a fever. SEEK IMMEDIATE MEDICAL CARE IF:   You have chills.  You feel nauseous or vomit.  You feel lightheaded or faint.  You are unable to urinate.  You have blood or  blood clots in your urine. Document Released: 04/06/2000 Document Revised: 07/02/2011 Document Reviewed: 10/27/2012 [facility] Patient Information 2014 [place], [place].

## 2013-04-20 NOTE — Progress Notes (Signed)
Pre-visit discussion using our clinic review tool. No additional management support is needed unless otherwise documented below in the visit note.  

## 2013-04-20 NOTE — Progress Notes (Signed)
Subjective:    Patient ID: [name], male    DOB: April 10, 1951, 62 y.o.   MRN: [id number]  HPI CC: malaise  Seen 03/18/2013 at [facility] with dx prostatitis, treated with 2 wk course of ciprofloxacin 500mg  bid.  Never fully resolved. Hasn't been feeling well for last 2 months - endorses dysuria, lower back and lower abdominal pain, occasional shooting pain down right leg in known h/o lumbar DDD with radiculopathy.  + bowel changes: diarrhea/constipation. Some dysuria recently with scrotal discomfort but no urgency, frequency, hematuria.  No flank pain.  Some incomplete emptying with urination noted. Denies fevers but endorses hot flashes.  Some decreased appetite.  + cough recently but no vomiting. No weight changes. Noticing black stools for last 2-3 weeks, malodorous.  H/o severe diverticulosis but no known itis.  Has had diverticular bleed leading to hospitalization in the past. Last colonoscopy was 09/2011. Has had unrevealing workup for hematuria with bladder wall thickening (2013 [facility])  Wt Readings from Last 3 Encounters:  04/20/13 234 lb (106.142 kg)  01/20/13 236 lb 4.8 oz (107.185 kg)  12/26/12 234 lb 8 oz (106.369 kg)     Past Medical History  Diagnosis Date  . GERD (gastroesophageal reflux disease)   . Hyperlipidemia   . Hypertension     Essential  . Anxiety   . Depression   . TIA (transient ischemic attack)     cocaine related  . T2DM (type 2 diabetes mellitus)   . Esophageal stricture 06/2007    dilation of stricture  . History of epididymitis   . Back pain     with radiculopathy  . Circadian rhythm sleep disorder, irregular sleep-wake type   . Abdominal wall hernia   . History of cocaine use 2004  . Complication of anesthesia     hard to put patient to sleep  . Chest pain, non-cardiac 2008    adenosine myoview low risk.  EF 60%.  normal cath 2008, deemed atypical /noncardiac, ?discogenic  . Diverticulosis 2013    by CT scan  . History of prostatitis  08/2011    ?chronic/simmering, treated with 1 mo cipro  . Chronic neck pain     s/p ACDF C5-7 2010 ([facility])  . Gross hematuria 2013    negative CT, cystoscopy, cytology  . Bladder wall thickening 2013    nl rpt CT scan, normal cytology and UCx, released from urology [name])  . OSA (obstructive sleep apnea) 2010    mild off CPAP   Past Surgical History  Procedure Laterality Date  . Tonsillectomy  childhood  . Carpal tunnel release      Right  . Cardiac catheterization  2008    WNL   . Trigger finger release      Right long finger  . Inguinal hernia repair      bilateral, 5 cm. RLQ Abd Lipoma Excision (Dr. Ezzard Standing)  . Esophagogastroduodenoscopy  07/17/07    Esoph. Stricture dilated (Dr. Marina Goodell)  . Anterior cervical discectomy  10/28/2008    with fusion (Dr. Shon Baton)  . Hematoma evacuation  10/29/2008    (Dr. Shon Baton)  . Direct laryngoscopy  11/02/2008    Extubation under anesthesia (Dr. Lazarus Salines)  . Colonoscopy  10/11/2011    severe diverticulosis, int hemorrhoids, rec rpt 5 yrs [name])  . Esophagogastroduodenoscopy  10/11/2011    WNL [name])  . Shoulder surgery Right 2013    replaced  . Shoulder arthroscopy with subacromial decompression and open rotator c Left 01/23/2013  Procedure: LEFT SHOULDER ARTHROSCOPY WITH SUBACROMIAL DECOMPRESSION AND MINI OPEN ROTATOR CUFF REPAIR, AND OPEN DISTAL CLAVICLE RESECTION;  Surgeon: Verlee Rossetti, MD;  Location: [facility] OR;  Service: Orthopedics;  Laterality: Left;     Review of Systems Per HPI    Objective:   Physical Exam  Nursing note and vitals reviewed. Constitutional: He appears well-developed and well-nourished. No distress.  HENT:  Mouth/Throat: Oropharynx is clear and moist. No oropharyngeal exudate.  Cardiovascular: Regular rhythm, normal heart sounds and intact distal pulses.  Tachycardia present.   No murmur heard. Pulmonary/Chest: Effort normal and breath sounds normal. No respiratory distress. He has no wheezes. He has no  rales.  Abdominal: Soft. Bowel sounds are normal. He exhibits no distension and no mass. There is no hepatosplenomegaly. There is tenderness in the suprapubic area. There is guarding. There is no rigidity, no rebound, no CVA tenderness and negative Murphy's sign. No hernia. Hernia confirmed negative in the right inguinal area and confirmed negative in the left inguinal area.  Genitourinary: Rectum normal and penis normal. Rectal exam shows no external hemorrhoid, no internal hemorrhoid, no fissure, no mass, no tenderness and anal tone normal. Guaiac negative stool. Prostate is enlarged and tender. Right testis shows no mass, no swelling and no tenderness. Right testis is descended. Left testis shows tenderness (at epididymis). Left testis shows no mass and no swelling. Left testis is descended. Uncircumcised.  Boggy prostate, very tender to palpation  Musculoskeletal: He exhibits no edema.  Lymphadenopathy:       Right: No inguinal adenopathy present.       Left: No inguinal adenopathy present.  Skin: Skin is warm and dry. No rash noted.       Assessment & Plan:

## 2013-04-20 NOTE — Assessment & Plan Note (Signed)
Given history and exam, anticipate recurrent or incompletely treated prostatitis. Will treat with prolonged 4 wk cipro course - and discussed possible effect of tendon rupture, discussing reasons to discontinue med immediately. Hemoccult in office negative today. I will check CBC and BMP today as well as PSA. If not improving, low threshold to obtain abd/ pelvis CT to eval for diverticultiis Pt agrees with plan.  Advised to notify us if sxs persist or deteriorate.

## 2013-04-21 LAB — URINE CULTURE
Colony Count: NO GROWTH
Organism ID, Bacteria: NO GROWTH

## 2013-06-19 ENCOUNTER — Other Ambulatory Visit: Payer: Self-pay

## 2013-06-19 DIAGNOSIS — E7800 Pure hypercholesterolemia, unspecified: Secondary | ICD-10-CM

## 2013-06-19 MED ORDER — METFORMIN HCL 1000 MG PO TABS: 1000.0000 mg | ORAL_TABLET | Freq: Two times a day (BID) | ORAL | Status: DC

## 2013-06-19 MED ORDER — ATORVASTATIN CALCIUM 10 MG PO TABS: 10.0000 mg | ORAL_TABLET | Freq: Every day | ORAL | Status: DC

## 2013-06-19 MED ORDER — LISINOPRIL 10 MG PO TABS: 5.0000 mg | ORAL_TABLET | Freq: Every day | ORAL | Status: DC

## 2013-06-19 NOTE — Telephone Encounter (Signed)
Pt request refill atorvastatin,lisinopril and metformin to [facility]. Pt will cb for CPX. Pt has already set up acct with [facility].

## 2013-06-23 ENCOUNTER — Encounter: Payer: Self-pay | Admitting: Family Medicine

## 2013-06-24 ENCOUNTER — Encounter: Payer: Self-pay | Admitting: Family Medicine

## 2013-06-24 ENCOUNTER — Ambulatory Visit (INDEPENDENT_AMBULATORY_CARE_PROVIDER_SITE_OTHER): Payer: Commercial Managed Care - HMO | Admitting: Family Medicine

## 2013-06-24 VITALS — BP 126/88 | HR 116 | Temp 98.0°F | Wt 236.8 lb

## 2013-06-24 DIAGNOSIS — R05 Cough: Secondary | ICD-10-CM | POA: Insufficient documentation

## 2013-06-24 DIAGNOSIS — R000 Tachycardia, unspecified: Secondary | ICD-10-CM

## 2013-06-24 DIAGNOSIS — M79609 Pain in unspecified limb: Secondary | ICD-10-CM

## 2013-06-24 DIAGNOSIS — R059 Cough, unspecified: Secondary | ICD-10-CM | POA: Insufficient documentation

## 2013-06-24 DIAGNOSIS — IMO0001 Reserved for inherently not codable concepts without codable children: Secondary | ICD-10-CM

## 2013-06-24 DIAGNOSIS — IMO0002 Reserved for concepts with insufficient information to code with codable children: Secondary | ICD-10-CM

## 2013-06-24 DIAGNOSIS — E1165 Type 2 diabetes mellitus with hyperglycemia: Secondary | ICD-10-CM

## 2013-06-24 DIAGNOSIS — K145 Plicated tongue: Secondary | ICD-10-CM

## 2013-06-24 DIAGNOSIS — M79672 Pain in left foot: Secondary | ICD-10-CM

## 2013-06-24 MED ORDER — LOSARTAN POTASSIUM 25 MG PO TABS: 25.0000 mg | ORAL_TABLET | Freq: Every day | ORAL | Status: DC

## 2013-06-24 MED ORDER — METFORMIN HCL 1000 MG PO TABS: 1000.0000 mg | ORAL_TABLET | Freq: Two times a day (BID) | ORAL | Status: DC

## 2013-06-24 MED ORDER — NYSTATIN 100000 UNIT/ML MT SUSP: 5.0000 mL | Freq: Four times a day (QID) | OROMUCOSAL | Status: DC

## 2013-06-24 NOTE — Assessment & Plan Note (Signed)
Noted again today.  Good O2 sat, doubt PE.  If cough doesn't improve with holding ACEI , will consider D dimer.

## 2013-06-24 NOTE — Assessment & Plan Note (Signed)
?  sprain - seems to be improving on its own. Continue to monitor.

## 2013-06-24 NOTE — Assessment & Plan Note (Signed)
With mild white coating on tongue - after recent 1 mo course of cipro antibiotic  ?thrush - will treat accordingly with nystatin swish/swallow.

## 2013-06-24 NOTE — Patient Instructions (Addendum)
I think this cough is coming from lisinopril. Let's stop this medicine and see if symptoms improve. Start instead losartan 25mg  daily. Let me know if cough and shortness of breath not improving for further evaluation. For tongue - I want to treat possible fungal infection after antibiotics with nystatin swish and swallow (sent in to [facility])

## 2013-06-24 NOTE — Assessment & Plan Note (Signed)
Dry cough for last 3 weeks, but no evidence of infectious cause today. ?ACEI induced dry cough - will do trial off lisinopril, start losartan 25mg  daily. Emphasized importance of f/u if not better off ACEI - would consider D dimer and CXR.

## 2013-06-24 NOTE — Progress Notes (Signed)
BP 126/88  Pulse 116  Temp(Src) 98 F (36.7 C) (Oral)  Wt 236 lb 12 oz (107.389 kg)  SpO2 96%   CC: cough ,foot pain  Subjective:    Patient ID: [name], male    DOB: 1950-11-26, 63 y.o.   MRN: [id number]  HPI: [name] is a 63 y.o. male presenting on 06/24/2013 with Cough and Foot Pain   [name] presents today to discuss cough with shortness of breath.  Endorses dry cough, worse at night time.  Ongoing for last 3 weeks.  Both started around the same time.  Increased dyspnea with exertion noted as well ie walking from parking lot to store  Denies fevers/chills, abd pain, nausea, vomiting.  No recent travel/prolonged immobility.  No fm or personal h/o blood clots.  L foot pain - tender at midfoot that radiated to lateral foot and posterior foot.  Today feeling better.  No swelling or redness or warmth.  Seen here 03/2013 with dx chronic prostatitis, treated with 4wk course cipro.  GU sxs better.  H/o severe diverticulosis but no known itis. Has had diverticular bleed leading to hospitalization in the past.  Last colonoscopy was 09/2011.  Has had unrevealing workup for hematuria with bladder wall thickening (2013 [facility]) Strong fmhx prostate cancer. Lab Results  Component Value Date   PSA 1.40 04/20/2013   PSA 1.09 06/16/2012   PSA 11.69* 08/27/2011    Past Medical History  Diagnosis Date  . GERD (gastroesophageal reflux disease)   . Hyperlipidemia   . Hypertension     Essential  . Anxiety   . Depression   . TIA (transient ischemic attack)     cocaine related  . T2DM (type 2 diabetes mellitus)   . Esophageal stricture 06/2007    dilation of stricture  . History of epididymitis   . Back pain     with radiculopathy  . Circadian rhythm sleep disorder, irregular sleep-wake type   . Abdominal wall hernia   . History of cocaine use 2004  . Complication of anesthesia     hard to put patient to sleep  . Chest pain, non-cardiac 2008    adenosine myoview  low risk.  EF 60%.  normal cath 2008, deemed atypical /noncardiac, ?discogenic  . Diverticulosis 2013    by CT scan  . History of prostatitis 08/2011    ?chronic/simmering, treated with 1 mo cipro  . Postlaminectomy syndrome of cervical region     s/p ACDF C5-7 2010 ([facility])  . Gross hematuria 2013    negative CT, cystoscopy, cytology  . Bladder wall thickening 2013    nl rpt CT scan, normal cytology and UCx, released from urology Karsten Ro)  . OSA (obstructive sleep apnea) 2010    mild off CPAP  . Chronic pain     narcotic dependent [name])    Past Surgical History  Procedure Laterality Date  . Tonsillectomy  childhood  . Carpal tunnel release      Right  . Cardiac catheterization  2008    WNL   . Trigger finger release      Right long finger  . Inguinal hernia repair      bilateral, 5 cm. RLQ Abd Lipoma Excision (Dr. Lucia Gaskins)  . Esophagogastroduodenoscopy  07/17/07    Esoph. Stricture dilated (Dr. Henrene Pastor)  . Anterior cervical discectomy  10/28/2008    with fusion (Dr. Rolena Infante)  . Hematoma evacuation  10/29/2008    (Dr. Rolena Infante)  . Direct laryngoscopy  11/02/2008  Extubation under anesthesia (Dr. Erik Obey)  . Colonoscopy  10/11/2011    severe diverticulosis, int hemorrhoids, rec rpt 5 yrs Carlean Purl)  . Esophagogastroduodenoscopy  10/11/2011    WNL Carlean Purl)  . Shoulder surgery Right 2013    replaced  . Shoulder arthroscopy with subacromial decompression and open rotator c Left 01/23/2013    Procedure: LEFT SHOULDER ARTHROSCOPY WITH SUBACROMIAL DECOMPRESSION AND MINI OPEN ROTATOR CUFF REPAIR, AND OPEN DISTAL CLAVICLE RESECTION;  Surgeon: Augustin Schooling, MD;  Location: [facility];  Service: Orthopedics;  Laterality: Left;   Family History  Problem Relation Age of Onset  . Diabetes Mother   . Hypertension Mother   . Hyperlipidemia Mother   . Diabetes Father   . Diabetes Sister   . Hypertension Sister   . Hypertension Brother   . Diabetes Brother   . Heart disease Maternal Uncle      Heart failure  . Cancer Paternal Uncle     Prostate  . Cancer Paternal Grandfather     Prostate  . Stroke Neg Hx   . Diabetes Brother   . Hypertension Brother   . Diabetes Brother        Relevant past medical, surgical, family and social history reviewed and updated as indicated.  Allergies and medications reviewed and updated. Current Outpatient Prescriptions on File Prior to Visit  Medication Sig  . aspirin EC 81 MG tablet Take 81 mg by mouth daily.  Marland Kitchen atorvastatin (LIPITOR) 10 MG tablet Take 1 tablet (10 mg total) by mouth daily.  . baclofen (LIORESAL) 10 MG tablet Take 10 mg by mouth at bedtime as needed (for back spasms).  Marland Kitchen glucose blood (ONE TOUCH ULTRA TEST) test strip TEST BLOOD SUGAR TWICE DAILY AS DIRECTED.  250.02  . oxyCODONE-acetaminophen (PERCOCET) 10-325 MG per tablet Take 1-2 tablets by mouth every 4 (four) hours as needed for pain.  . Tamsulosin HCl (FLOMAX) 0.4 MG CAPS Take 0.4 mg by mouth daily after supper.   No current facility-administered medications on file prior to visit.    Review of Systems Per HPI unless specifically indicated above    Objective:    BP 126/88  Pulse 116  Temp(Src) 98 F (36.7 C) (Oral)  Wt 236 lb 12 oz (107.389 kg)  SpO2 96%  Physical Exam  Nursing note and vitals reviewed. Constitutional: He appears well-developed and well-nourished. No distress.  HENT:  Head: Normocephalic and atraumatic.  Right Ear: Hearing, tympanic membrane, external ear and ear canal normal.  Left Ear: Hearing, tympanic membrane, external ear and ear canal normal.  Nose: Nose normal. No mucosal edema or rhinorrhea. Right sinus exhibits no maxillary sinus tenderness and no frontal sinus tenderness. Left sinus exhibits no maxillary sinus tenderness and no frontal sinus tenderness.  Mouth/Throat: Uvula is midline, oropharynx is clear and moist and mucous membranes are normal. No oropharyngeal exudate, posterior oropharyngeal edema, posterior  oropharyngeal erythema or tonsillar abscesses.  Fissured tongue, slight white patches on tongue, not scrapeable  Eyes: Conjunctivae and EOM are normal. Pupils are equal, round, and reactive to light. No scleral icterus.  Neck: Normal range of motion. Neck supple.  Cardiovascular: Regular rhythm, normal heart sounds and intact distal pulses.  Tachycardia present.   No murmur heard. Pulmonary/Chest: Effort normal and breath sounds normal. No respiratory distress. He has no wheezes. He has no rales.  Musculoskeletal: He exhibits no edema.  Tender lateral left foot at distal 5th MT 2+ DP left foot  Lymphadenopathy:    He has no cervical  adenopathy.  Skin: Skin is warm and dry. No rash noted.       Assessment & Plan:   Problem List Items Addressed This Visit   Cough     Dry cough for last 3 weeks, but no evidence of infectious cause today. ?ACEI induced dry cough - will do trial off lisinopril, start losartan 25mg  daily. Emphasized importance of f/u if not better off ACEI - would consider D dimer and CXR.    Diabetes type 2, uncontrolled - Primary     States he's been out of metformin for last several days - awaiting refill from mail order pharmacy. I have refilled short course of metformin to local pharmacy [facility])    Relevant Medications      metFORMIN (GLUCOPHAGE) tablet      losartan (COZAAR) tablet   Fissure, tongue     With mild white coating on tongue - after recent 1 mo course of cipro antibiotic  ?thrush - will treat accordingly with nystatin swish/swallow.    Foot pain, left     ?sprain - seems to be improving on its own. Continue to monitor.    Tachycardia     Noted again today.  Good O2 sat, doubt PE.  If cough doesn't improve with holding ACEI , will consider D dimer.        Follow up plan: Return if symptoms worsen or fail to improve.

## 2013-06-24 NOTE — Progress Notes (Signed)
Pre visit review using our clinic review tool, if applicable. No additional management support is needed unless otherwise documented below in the visit note. 

## 2013-06-24 NOTE — Assessment & Plan Note (Signed)
States he's been out of metformin for last several days - awaiting refill from mail order pharmacy. I have refilled short course of metformin to local pharmacy [facility])

## 2013-06-26 ENCOUNTER — Telehealth: Payer: Self-pay

## 2013-06-26 NOTE — Telephone Encounter (Signed)
Relevant patient education mailed to patient.  

## 2013-07-01 LAB — HM DIABETES EYE EXAM

## 2013-07-03 ENCOUNTER — Encounter: Payer: Self-pay | Admitting: Family Medicine

## 2013-07-13 ENCOUNTER — Telehealth: Payer: Self-pay

## 2013-07-13 NOTE — Telephone Encounter (Signed)
Pt has 3 questions; Does pt need med to protect kidneys since taking Metformin? Pt called phone # on TV about study for Metformin. The doctors office doing the study is going to call pt back in few weeks and pt wants to know if Dr Darnell Level thinks good idea for pt to participate in Metformin study.Please advise. Pt also said last 2 times in office pts heart rate was elevated and pt wants to know if should see cardiologist or not. Pt is not sure what heart rate is now.Please advise.

## 2013-07-14 NOTE — Telephone Encounter (Signed)
He is already on losartan which will help protect kidneys.  Metformin should not negatively affect kidneys. Should be ok to participate in study - but I would like more information - ie will he be required to change any of his meds? recommend recheck pulse next office visit.  notify us sooner if persistent cough or rapid pulse.. Pulse Readings from Last 3 Encounters:  06/24/13 116  04/20/13 116  03/18/13 91

## 2013-07-15 NOTE — Telephone Encounter (Signed)
Information given to pt.  Verbalized understanding.  States that the study did not tell him that he would need to change meds.  He has not had any issues with cough since his last visit.  Will inform us if cough returns or if he has rapid pulse.

## 2013-07-22 ENCOUNTER — Other Ambulatory Visit: Payer: Self-pay

## 2013-07-22 MED ORDER — BACLOFEN 10 MG PO TABS: 10.0000 mg | ORAL_TABLET | Freq: Every evening | ORAL | Status: DC | PRN

## 2013-07-22 NOTE — Telephone Encounter (Signed)
plz notify sent in. 

## 2013-07-22 NOTE — Telephone Encounter (Signed)
Pt request baclofen for back spasms to [facility]. Pt request cb when sent to pharmacy.

## 2013-07-23 NOTE — Telephone Encounter (Signed)
Patient notified

## 2013-08-22 ENCOUNTER — Other Ambulatory Visit: Payer: Self-pay | Admitting: Family Medicine

## 2013-08-24 ENCOUNTER — Other Ambulatory Visit: Payer: Self-pay | Admitting: *Deleted

## 2013-08-24 MED ORDER — GLUCOSE BLOOD VI STRP: ORAL_STRIP | Status: DC

## 2013-08-27 ENCOUNTER — Other Ambulatory Visit: Payer: Self-pay

## 2013-08-27 NOTE — Telephone Encounter (Signed)
Pt request refill on test strips; [facility] said test strips ready for pick up. Pt voiced understanding.

## 2013-09-15 ENCOUNTER — Ambulatory Visit: Payer: Self-pay | Admitting: Cardiovascular Disease

## 2013-09-23 ENCOUNTER — Encounter: Payer: Self-pay | Admitting: Family Medicine

## 2013-09-23 ENCOUNTER — Other Ambulatory Visit: Payer: Self-pay | Admitting: Family Medicine

## 2013-09-23 ENCOUNTER — Ambulatory Visit (INDEPENDENT_AMBULATORY_CARE_PROVIDER_SITE_OTHER): Payer: Commercial Managed Care - HMO | Admitting: Family Medicine

## 2013-09-23 VITALS — BP 138/86 | HR 84 | Temp 98.2°F | Wt 239.8 lb

## 2013-09-23 DIAGNOSIS — M7750 Other enthesopathy of unspecified foot: Secondary | ICD-10-CM | POA: Insufficient documentation

## 2013-09-23 DIAGNOSIS — E7800 Pure hypercholesterolemia, unspecified: Secondary | ICD-10-CM

## 2013-09-23 DIAGNOSIS — IMO0001 Reserved for inherently not codable concepts without codable children: Secondary | ICD-10-CM

## 2013-09-23 DIAGNOSIS — E1165 Type 2 diabetes mellitus with hyperglycemia: Secondary | ICD-10-CM

## 2013-09-23 DIAGNOSIS — IMO0002 Reserved for concepts with insufficient information to code with codable children: Secondary | ICD-10-CM

## 2013-09-23 DIAGNOSIS — M659 Synovitis and tenosynovitis, unspecified: Secondary | ICD-10-CM

## 2013-09-23 MED ORDER — ATORVASTATIN CALCIUM 10 MG PO TABS: 10.0000 mg | ORAL_TABLET | Freq: Every day | ORAL | Status: DC

## 2013-09-23 MED ORDER — METFORMIN HCL 1000 MG PO TABS: 1000.0000 mg | ORAL_TABLET | Freq: Two times a day (BID) | ORAL | Status: DC

## 2013-09-23 MED ORDER — DICLOFENAC SODIUM 1 % TD GEL: 1.0000 "application " | Freq: Three times a day (TID) | TRANSDERMAL | Status: DC

## 2013-09-23 MED ORDER — LOSARTAN POTASSIUM 25 MG PO TABS: 25.0000 mg | ORAL_TABLET | Freq: Every day | ORAL | Status: DC

## 2013-09-23 MED ORDER — TAMSULOSIN HCL 0.4 MG PO CAPS: 0.4000 mg | ORAL_CAPSULE | Freq: Every day | ORAL | Status: DC

## 2013-09-23 NOTE — Assessment & Plan Note (Signed)
Anticipate tarsal tunnel tendonitis - treat with OTC aleve bid x 5 days then prn, ice and voltaren gel. If not better discussed eval for orthotics as R flat foot noted on exam today. Pt agrees with plan.

## 2013-09-23 NOTE — Patient Instructions (Signed)
I think you have tarsal tunnel tendonitis - treat with aleve over the counter twice daily with food for 5 days and I have sent anti inflammatory cream to use (will see if insurance covers this). May also use ice to affected area. If not better let us know for referral to foot doctor to discuss possible orthotics.

## 2013-09-23 NOTE — Progress Notes (Signed)
BP 138/86  Pulse 84  Temp(Src) 98.2 F (36.8 C) (Oral)  Wt 239 lb 12 oz (108.75 kg)   CC: foot pain  Subjective:    Patient ID: [name], male    DOB: 1950-06-26, 63 y.o.   MRN: [id number]  HPI: [name] is a 63 y.o. male presenting on 09/23/2013 for Foot Pain   Last visit here (06/2013) endorsed L foot pain - tender at midfoot that radiated to lateral foot and posterior foot. No swelling or redness or warmth. At that time, thought consistent with foot sprain that seemed to be resolving on its own. Resolved.  Now over last 3 weeks noticing constant sharp pains occasional throbbing at right medial foot - points to tarsal tunnel posterior to medial malleolus.  occasional radiation of pain to anterior knee. Keeps him up at night. R>L. Denies inciting trauma, falls, denies edema. No significant paresthesias. Hasn't tried anything for this yet.  Lab Results  Component Value Date   HGBA1C 7.4* 12/26/2012  started making smoothies Wt Readings from Last 3 Encounters:  09/23/13 239 lb 12 oz (108.75 kg)  06/24/13 236 lb 12 oz (107.389 kg)  04/20/13 234 lb (106.142 kg)    Past Medical History  Diagnosis Date  . GERD (gastroesophageal reflux disease)   . Hyperlipidemia   . Hypertension     Essential  . Anxiety   . Depression   . TIA (transient ischemic attack)     cocaine related  . T2DM (type 2 diabetes mellitus)   . Esophageal stricture 06/2007    dilation of stricture  . History of epididymitis   . Back pain     with radiculopathy  . Circadian rhythm sleep disorder, irregular sleep-wake type   . Abdominal wall hernia   . History of cocaine use 2004  . Complication of anesthesia     hard to put patient to sleep  . Chest pain, non-cardiac 2008    adenosine myoview low risk.  EF 60%.  normal cath 2008, deemed atypical /noncardiac, ?discogenic  . Diverticulosis 2013    by CT scan  . History of prostatitis 08/2011    ?chronic/simmering, treated with 1 mo cipro   . Postlaminectomy syndrome of cervical region     s/p ACDF C5-7 2010 ([facility])  . Gross hematuria 2013    negative CT, cystoscopy, cytology  . Bladder wall thickening 2013    nl rpt CT scan, normal cytology and UCx, released from urology Karsten Ro)  . OSA (obstructive sleep apnea) 2010    mild off CPAP  . Chronic pain     narcotic dependent ([facility])   Relevant past medical, surgical, family and social history reviewed and updated as indicated.  Allergies and medications reviewed and updated. Current Outpatient Prescriptions on File Prior to Visit  Medication Sig  . aspirin EC 81 MG tablet Take 81 mg by mouth daily.  . baclofen (LIORESAL) 10 MG tablet Take 1 tablet (10 mg total) by mouth at bedtime as needed (for back spasms).  Marland Kitchen glucose blood (ONE TOUCH ULTRA TEST) test strip USE TO TEST BLOOD SUGAR TWICE DAILY AS DIRECTED Dx:250.02  . oxyCODONE-acetaminophen (PERCOCET) 10-325 MG per tablet Take 1-2 tablets by mouth every 4 (four) hours as needed for pain.  Marland Kitchen nystatin (MYCOSTATIN) 100000 UNIT/ML suspension Take 5 mLs (500,000 Units total) by mouth 4 (four) times daily. X 7 days  . ranitidine (ZANTAC) 75 MG tablet Take 75 mg by mouth 2 (two) times daily.  No current facility-administered medications on file prior to visit.    Review of Systems Per HPI unless specifically indicated above    Objective:    BP 138/86  Pulse 84  Temp(Src) 98.2 F (36.8 C) (Oral)  Wt 239 lb 12 oz (108.75 kg)  Physical Exam  Nursing note and vitals reviewed. Constitutional: He appears well-developed and well-nourished. No distress.  Musculoskeletal: He exhibits no edema.  Tender to palpation of tarsal tunnel on right with mild swelling. Painful with plantar flexion and toe flexion Loss of longitudinal arch R>L No pain at malleoli or base of 5th MT, no ligament laxity, no  L foot without pain. Significant pronation on ambulation R>L.  Diabetic foot exam: Normal inspection No skin breakdown No  calluses  Normal DP pulses Normal sensation to light touch and monofilament Nails normal   Results for orders placed in visit on 07/03/13  HM DIABETES EYE EXAM      Result Value Ref Range   HM Diabetic Eye Exam No Retinopathy  No Retinopathy      Assessment & Plan:   Problem List Items Addressed This Visit   Tendonitis of ankle or foot - Primary     Anticipate tarsal tunnel tendonitis - treat with OTC aleve bid x 5 days then prn, ice and voltaren gel. If not better discussed eval for orthotics as R flat foot noted on exam today. Pt agrees with plan.    HYPERCHOLESTEROLEMIA, PURE   Relevant Medications      atorvastatin (LIPITOR) tablet      losartan (COZAAR) tablet   Diabetes type 2, uncontrolled   Relevant Medications      atorvastatin (LIPITOR) tablet      losartan (COZAAR) tablet      metFORMIN (GLUCOPHAGE) tablet   Other Relevant Orders      HM DIABETES FOOT EXAM (Completed)       Follow up plan: Return if symptoms worsen or fail to improve.

## 2013-09-23 NOTE — Progress Notes (Signed)
Pre visit review using our clinic review tool, if applicable. No additional management support is needed unless otherwise documented below in the visit note. 

## 2013-10-01 ENCOUNTER — Telehealth: Payer: Self-pay

## 2013-10-01 DIAGNOSIS — M7750 Other enthesopathy of unspecified foot: Secondary | ICD-10-CM

## 2013-10-01 DIAGNOSIS — M2140 Flat foot [pes planus] (acquired), unspecified foot: Secondary | ICD-10-CM

## 2013-10-01 NOTE — Telephone Encounter (Signed)
Pt was seen on 09/23/13 with rt foot pain; pt said rt foot pain is no better and pt cannot rest at night due to pain; today rt foot is slightly swollen and pt could not afford the  Diclofenac cream. Pt wants to know if he should see Dr Veverly Fells or [facility]. Pt request cb.

## 2013-10-01 NOTE — Telephone Encounter (Signed)
Will refer to foot specialist at Dr. Veverly Fells' office.  plz notify pt to expect a call from our referral coordinator

## 2013-10-02 NOTE — Telephone Encounter (Signed)
Patient notified and will await call.

## 2013-10-12 MED ORDER — NAPROXEN 500 MG PO TABS: ORAL_TABLET | ORAL | Status: DC

## 2013-10-12 NOTE — Addendum Note (Signed)
Addended by: Ria Bush on: 10/12/2013 03:03 PM   Modules accepted: Orders

## 2013-10-12 NOTE — Telephone Encounter (Addendum)
rec voltaren gel, tylenol 500mg  bid and his pain medication Can try short course naprosyn while he sees ortho (sent in #20). Take with food, lots of fluids. Ensure he has tolerated NSAIDs in past ok

## 2013-10-12 NOTE — Telephone Encounter (Signed)
Attempted to call patient. No answer. Unable to leave message due to no voicemail set up. Will try again later.

## 2013-10-12 NOTE — Telephone Encounter (Signed)
Pt said he is going to schedule appt with Dr Veverly Fells about foot pain but until then pt said rt foot is very painful;now pain level is 9. Still slight swelling in rt foot but no redness noted. Pt has taken Advil without relief and pt request pain med to [facility].Please advise.

## 2013-10-13 ENCOUNTER — Telehealth: Payer: Self-pay | Admitting: Family Medicine

## 2013-10-13 NOTE — Telephone Encounter (Signed)
Noted  

## 2013-10-13 NOTE — Telephone Encounter (Signed)
Spoke with patient. He had already picked up medication and used it and said he slept better last night than he had in a long time. He was very grateful. He has tolerated NSAID's and was reminded to take with food and lots of water.

## 2013-10-13 NOTE — Telephone Encounter (Signed)
Patient said you gave him medication for his foot pain.  He wanted to tell you Thank You.  He said his foot is feeling much better today and he was able to sleep.

## 2013-11-04 ENCOUNTER — Other Ambulatory Visit: Payer: Self-pay | Admitting: Family Medicine

## 2013-11-04 DIAGNOSIS — D509 Iron deficiency anemia, unspecified: Secondary | ICD-10-CM

## 2013-11-04 DIAGNOSIS — I10 Essential (primary) hypertension: Secondary | ICD-10-CM

## 2013-11-04 DIAGNOSIS — E1165 Type 2 diabetes mellitus with hyperglycemia: Secondary | ICD-10-CM

## 2013-11-04 DIAGNOSIS — E7800 Pure hypercholesterolemia, unspecified: Secondary | ICD-10-CM

## 2013-11-04 DIAGNOSIS — N402 Nodular prostate without lower urinary tract symptoms: Secondary | ICD-10-CM

## 2013-11-04 DIAGNOSIS — IMO0002 Reserved for concepts with insufficient information to code with codable children: Secondary | ICD-10-CM

## 2013-11-05 ENCOUNTER — Other Ambulatory Visit (INDEPENDENT_AMBULATORY_CARE_PROVIDER_SITE_OTHER): Payer: Commercial Managed Care - HMO

## 2013-11-05 ENCOUNTER — Telehealth: Payer: Self-pay

## 2013-11-05 ENCOUNTER — Encounter: Payer: Self-pay | Admitting: *Deleted

## 2013-11-05 DIAGNOSIS — I10 Essential (primary) hypertension: Secondary | ICD-10-CM

## 2013-11-05 DIAGNOSIS — D509 Iron deficiency anemia, unspecified: Secondary | ICD-10-CM

## 2013-11-05 DIAGNOSIS — N402 Nodular prostate without lower urinary tract symptoms: Secondary | ICD-10-CM

## 2013-11-05 DIAGNOSIS — E1165 Type 2 diabetes mellitus with hyperglycemia: Secondary | ICD-10-CM

## 2013-11-05 DIAGNOSIS — E7800 Pure hypercholesterolemia, unspecified: Secondary | ICD-10-CM

## 2013-11-05 DIAGNOSIS — IMO0002 Reserved for concepts with insufficient information to code with codable children: Secondary | ICD-10-CM

## 2013-11-05 DIAGNOSIS — IMO0001 Reserved for inherently not codable concepts without codable children: Secondary | ICD-10-CM

## 2013-11-05 LAB — CBC WITH DIFFERENTIAL/PLATELET
BASOS ABS: 0.1 10*3/uL (ref 0.0–0.1)
Basophils Relative: 0.8 % (ref 0.0–3.0)
Eosinophils Absolute: 0.3 10*3/uL (ref 0.0–0.7)
Eosinophils Relative: 4 % (ref 0.0–5.0)
HCT: 40.1 % (ref 39.0–52.0)
Hemoglobin: 13.3 g/dL (ref 13.0–17.0)
Lymphocytes Relative: 30.3 % (ref 12.0–46.0)
Lymphs Abs: 1.9 10*3/uL (ref 0.7–4.0)
MCHC: 33 g/dL (ref 30.0–36.0)
MCV: 90.4 fl (ref 78.0–100.0)
MONOS PCT: 8.7 % (ref 3.0–12.0)
Monocytes Absolute: 0.6 10*3/uL (ref 0.1–1.0)
NEUTROS ABS: 3.6 10*3/uL (ref 1.4–7.7)
Neutrophils Relative %: 56.2 % (ref 43.0–77.0)
PLATELETS: 271 10*3/uL (ref 150.0–400.0)
RBC: 4.43 Mil/uL (ref 4.22–5.81)
RDW: 15.1 % (ref 11.5–15.5)
WBC: 6.3 10*3/uL (ref 4.0–10.5)

## 2013-11-05 LAB — BASIC METABOLIC PANEL
BUN: 11 mg/dL (ref 6–23)
CHLORIDE: 103 meq/L (ref 96–112)
CO2: 25 mEq/L (ref 19–32)
Calcium: 9 mg/dL (ref 8.4–10.5)
Creatinine, Ser: 0.9 mg/dL (ref 0.4–1.5)
GFR: 110.77 mL/min (ref >60.00)
GLUCOSE: 168 mg/dL — AB (ref 70–99)
POTASSIUM: 4.2 meq/L (ref 3.5–5.1)
Sodium: 136 mEq/L (ref 135–145)

## 2013-11-05 LAB — LIPID PANEL
CHOL/HDL RATIO: 4
Cholesterol: 109 mg/dL (ref 0–200)
HDL: 30.1 mg/dL — AB (ref >39.00)
LDL Cholesterol: 61 mg/dL (ref 0–99)
NonHDL: 78.9
Triglycerides: 91 mg/dL (ref 0.0–149.0)
VLDL: 18.2 mg/dL (ref 0.0–40.0)

## 2013-11-05 LAB — PSA: PSA: 0.99 ng/mL (ref 0.10–4.00)

## 2013-11-05 LAB — HEMOGLOBIN A1C: HEMOGLOBIN A1C: 7.9 % — AB (ref 4.6–6.5)

## 2013-11-05 NOTE — Telephone Encounter (Signed)
rec he use his oxycodone. Has he tried tylenol and voltaren gel? Naprosyn helped last month - has he run out of this?

## 2013-11-05 NOTE — Telephone Encounter (Signed)
Spoke with patient. He said the naproxen worked great for a short while but then wore off. He couldn't afford the voltaren gel and tylenol never works. He will try the oxycodone. He said he doesn't have an ortho appt yet. He was hoping to get in with Dr. Veverly Fells next month but hasn't got anything scheduled. I told him to call and ask to see the next available instead of waiting to see Dr. Veverly Fells because he may be waiting a long time and this has been going on for quite awhile. He agreed and verbalized understanding.

## 2013-11-05 NOTE — Telephone Encounter (Signed)
Pt said he has appt to see Dr Veverly Fells, ortho next month; today pt continuing to complain with feet hurting, rt foot hurts more than lt foot. No swelling or redness noted. Pt request stronger med than Naprosyn until can see Dr Veverly Fells. Naprosyn was not effective for pain;see 10/01/13 phone note. [facility]. Pt request cb.

## 2013-11-10 ENCOUNTER — Other Ambulatory Visit: Payer: Self-pay

## 2013-11-10 MED ORDER — GLUCOSE BLOOD VI STRP: ORAL_STRIP | Status: DC

## 2013-11-10 NOTE — Telephone Encounter (Signed)
Pt request test strips sent to [facility] mail order advised pt done.

## 2013-11-17 ENCOUNTER — Other Ambulatory Visit: Payer: Self-pay

## 2013-11-17 DIAGNOSIS — E7800 Pure hypercholesterolemia, unspecified: Secondary | ICD-10-CM

## 2013-11-17 MED ORDER — BACLOFEN 10 MG PO TABS: 10.0000 mg | ORAL_TABLET | Freq: Every evening | ORAL | Status: DC | PRN

## 2013-11-17 MED ORDER — TAMSULOSIN HCL 0.4 MG PO CAPS: 0.4000 mg | ORAL_CAPSULE | Freq: Every day | ORAL | Status: DC

## 2013-11-17 MED ORDER — LOSARTAN POTASSIUM 25 MG PO TABS: 25.0000 mg | ORAL_TABLET | Freq: Every day | ORAL | Status: DC

## 2013-11-17 MED ORDER — METFORMIN HCL 1000 MG PO TABS: 1000.0000 mg | ORAL_TABLET | Freq: Two times a day (BID) | ORAL | Status: DC

## 2013-11-17 MED ORDER — ATORVASTATIN CALCIUM 10 MG PO TABS
10.0000 mg | ORAL_TABLET | Freq: Every day | ORAL | Status: DC
Start: 2013-11-17 — End: 2014-12-22

## 2013-11-17 NOTE — Telephone Encounter (Signed)
Pt request refill Baclofen to [facility] order pharmacy.Was not sure of quantity you would want to sen to mail order; Please advise.Pt said refills on 09/23/13 were supposed to go to [facility] order not [facility]. Refills sent to [facility] and cancelled refills at [facility].

## 2013-12-11 ENCOUNTER — Ambulatory Visit (INDEPENDENT_AMBULATORY_CARE_PROVIDER_SITE_OTHER): Payer: Commercial Managed Care - HMO | Admitting: Family Medicine

## 2013-12-11 ENCOUNTER — Encounter: Payer: Self-pay | Admitting: Family Medicine

## 2013-12-11 VITALS — BP 136/82 | HR 88 | Temp 98.2°F | Wt 235.5 lb

## 2013-12-11 DIAGNOSIS — E1165 Type 2 diabetes mellitus with hyperglycemia: Secondary | ICD-10-CM

## 2013-12-11 DIAGNOSIS — R059 Cough, unspecified: Secondary | ICD-10-CM

## 2013-12-11 DIAGNOSIS — IMO0001 Reserved for inherently not codable concepts without codable children: Secondary | ICD-10-CM

## 2013-12-11 DIAGNOSIS — R05 Cough: Secondary | ICD-10-CM

## 2013-12-11 DIAGNOSIS — IMO0002 Reserved for concepts with insufficient information to code with codable children: Secondary | ICD-10-CM

## 2013-12-11 MED ORDER — OMEPRAZOLE 40 MG PO CPDR: 40.0000 mg | DELAYED_RELEASE_CAPSULE | Freq: Every day | ORAL | Status: DC

## 2013-12-11 NOTE — Progress Notes (Signed)
BP 136/82  Pulse 88  Temp(Src) 98.2 F (36.8 C) (Oral)  Wt 235 lb 8 oz (106.822 kg)   CC: DM bundle  Subjective:    Patient ID: [name], male    DOB: 1951-04-05, 63 y.o.   MRN: [id number]  HPI: KAREEN HITSMAN is a 63 y.o. male presenting on 12/11/2013 for Follow-up   Pt here today for BP check for DM bundle.  Would also like to discuss cough - present for last month, clear mucous coming up. Worst trouble with cough is at night time. Associated with some chest tightness. Some pleuritic component. No fevers/chills. No sick contacts. So far has tried OTC remedy without improvement. Denies significant GERD or allergy sxs but has noticed increased burning in stomach over last few weeks.  DM - recently A1c increased to 7.9%. Regularly does check sugars fasting 120 average, PM after dinner 90-150.  Compliant with antihyperglycemic regimen which includes: metformin 1000mg  bid.  Denies low sugars or hypoglycemic symptoms.  Last diabetic eye exam 06/2013.  Pneumovax: 05/2012.  Prevnar: not due yet. Lab Results  Component Value Date   HGBA1C 7.9* 11/05/2013   Wt Readings from Last 3 Encounters:  12/11/13 235 lb 8 oz (106.822 kg)  09/23/13 239 lb 12 oz (108.75 kg)  06/24/13 236 lb 12 oz (107.389 kg)   Body mass index is 35.28 kg/(m^2).   Relevant past medical, surgical, family and social history reviewed and updated as indicated.  Allergies and medications reviewed and updated. Current Outpatient Prescriptions on File Prior to Visit  Medication Sig  . aspirin EC 81 MG tablet Take 81 mg by mouth daily.  Marland Kitchen atorvastatin (LIPITOR) 10 MG tablet Take 1 tablet (10 mg total) by mouth daily.  . baclofen (LIORESAL) 10 MG tablet Take 1 tablet (10 mg total) by mouth at bedtime as needed (for back spasms).  . diclofenac sodium (VOLTAREN) 1 % GEL Apply 1 application topically 3 (three) times daily.  Marland Kitchen glucose blood (ONE TOUCH ULTRA TEST) test strip USE TO TEST BLOOD SUGAR TWICE DAILY AS  DIRECTED Dx:250.02  . losartan (COZAAR) 25 MG tablet Take 1 tablet (25 mg total) by mouth daily.  . metFORMIN (GLUCOPHAGE) 1000 MG tablet Take 1 tablet (1,000 mg total) by mouth 2 (two) times daily with a meal.  . nystatin (MYCOSTATIN) 100000 UNIT/ML suspension Take 5 mLs (500,000 Units total) by mouth 4 (four) times daily. X 7 days  . oxyCODONE-acetaminophen (PERCOCET) 10-325 MG per tablet Take 1-2 tablets by mouth every 4 (four) hours as needed for pain.  . ranitidine (ZANTAC) 75 MG tablet Take 75 mg by mouth 2 (two) times daily.  . tamsulosin (FLOMAX) 0.4 MG CAPS capsule Take 1 capsule (0.4 mg total) by mouth daily after supper.   No current facility-administered medications on file prior to visit.    Review of Systems Per HPI unless specifically indicated above    Objective:    BP 136/82  Pulse 88  Temp(Src) 98.2 F (36.8 C) (Oral)  Wt 235 lb 8 oz (106.822 kg)  Physical Exam  Nursing note and vitals reviewed. Constitutional: He appears well-developed and well-nourished. No distress.  HENT:  Mouth/Throat: Oropharynx is clear and moist. No oropharyngeal exudate.  Cardiovascular: Normal rate, regular rhythm, normal heart sounds and intact distal pulses.   No murmur heard. Pulmonary/Chest: Effort normal and breath sounds normal. No respiratory distress. He has no wheezes. He has no rales.  Musculoskeletal: He exhibits no edema.  Skin: Skin is warm  and dry. No rash noted.  Psychiatric: He has a normal mood and affect.   Results for orders placed in visit on 11/05/13  CBC WITH DIFFERENTIAL      Result Value Ref Range   WBC 6.3  4.0 - 10.5 K/uL   RBC 4.43  4.22 - 5.81 Mil/uL   Hemoglobin 13.3  13.0 - 17.0 g/dL   HCT 40.1  39.0 - 52.0 %   MCV 90.4  78.0 - 100.0 fl   MCHC 33.0  30.0 - 36.0 g/dL   RDW 15.1  11.5 - 15.5 %   Platelets 271.0  150.0 - 400.0 K/uL   Neutrophils Relative % 56.2  43.0 - 77.0 %   Lymphocytes Relative 30.3  12.0 - 46.0 %   Monocytes Relative 8.7  3.0 -  12.0 %   Eosinophils Relative 4.0  0.0 - 5.0 %   Basophils Relative 0.8  0.0 - 3.0 %   Neutro Abs 3.6  1.4 - 7.7 K/uL   Lymphs Abs 1.9  0.7 - 4.0 K/uL   Monocytes Absolute 0.6  0.1 - 1.0 K/uL   Eosinophils Absolute 0.3  0.0 - 0.7 K/uL   Basophils Absolute 0.1  0.0 - 0.1 K/uL  BASIC METABOLIC PANEL      Result Value Ref Range   Sodium 136  135 - 145 mEq/L   Potassium 4.2  3.5 - 5.1 mEq/L   Chloride 103  96 - 112 mEq/L   CO2 25  19 - 32 mEq/L   Glucose, Bld 168 (*) 70 - 99 mg/dL   BUN 11  6 - 23 mg/dL   Creatinine, Ser 0.9  0.4 - 1.5 mg/dL   Calcium 9.0  8.4 - 10.5 mg/dL   GFR 110.77  >60.00 mL/min  HEMOGLOBIN A1C      Result Value Ref Range   Hemoglobin A1C 7.9 (*) 4.6 - 6.5 %  LIPID PANEL      Result Value Ref Range   Cholesterol 109  0 - 200 mg/dL   Triglycerides 91.0  0.0 - 149.0 mg/dL   HDL 30.10 (*) >39.00 mg/dL   VLDL 18.2  0.0 - 40.0 mg/dL   LDL Cholesterol 61  0 - 99 mg/dL   Total CHOL/HDL Ratio 4     NonHDL 78.90    PSA      Result Value Ref Range   PSA 0.99  0.10 - 4.00 ng/mL      Assessment & Plan:   Problem List Items Addressed This Visit   Cough     Recurrence of cough but not consistent with infectious cause. ?GERD related. Will do trial omeprazole instad of zantac for next 3 weeks. Update if persistent cough.    Diabetes type 2, uncontrolled - Primary     Anticipate improved control today based on sugar log he brings in. Therefore no changes indicated today. Will recheck A1c next labwork. Upcoming cataract surgery.        Follow up plan: Return in about 3 months (around 03/13/2014), or as neede, for medicare wellness.

## 2013-12-11 NOTE — Progress Notes (Signed)
Pre visit review using our clinic review tool, if applicable. No additional management support is needed unless otherwise documented below in the visit note. 

## 2013-12-11 NOTE — Patient Instructions (Addendum)
Next visit will be medicare wellness visit - or physical. Return in 2-3 months for this. Try omeprazole 40mg  daily 30 min before a meal for 3 weeks instead of ranitidine. We will see if cough improves with this.

## 2013-12-11 NOTE — Assessment & Plan Note (Signed)
Anticipate improved control today based on sugar log he brings in. Therefore no changes indicated today. Will recheck A1c next labwork. Upcoming cataract surgery.

## 2013-12-11 NOTE — Assessment & Plan Note (Signed)
Recurrence of cough but not consistent with infectious cause. ?GERD related. Will do trial omeprazole instad of zantac for next 3 weeks. Update if persistent cough.

## 2014-01-26 ENCOUNTER — Ambulatory Visit (INDEPENDENT_AMBULATORY_CARE_PROVIDER_SITE_OTHER): Payer: Commercial Managed Care - HMO | Admitting: Family Medicine

## 2014-01-26 ENCOUNTER — Encounter: Payer: Self-pay | Admitting: Family Medicine

## 2014-01-26 VITALS — BP 136/84 | HR 80 | Temp 98.1°F | Ht 67.0 in | Wt 250.8 lb

## 2014-01-26 DIAGNOSIS — IMO0002 Reserved for concepts with insufficient information to code with codable children: Secondary | ICD-10-CM

## 2014-01-26 DIAGNOSIS — F341 Dysthymic disorder: Secondary | ICD-10-CM

## 2014-01-26 DIAGNOSIS — E7800 Pure hypercholesterolemia, unspecified: Secondary | ICD-10-CM

## 2014-01-26 DIAGNOSIS — Z23 Encounter for immunization: Secondary | ICD-10-CM

## 2014-01-26 DIAGNOSIS — Z0000 Encounter for general adult medical examination without abnormal findings: Secondary | ICD-10-CM

## 2014-01-26 DIAGNOSIS — E1165 Type 2 diabetes mellitus with hyperglycemia: Secondary | ICD-10-CM

## 2014-01-26 DIAGNOSIS — Z7189 Other specified counseling: Secondary | ICD-10-CM | POA: Insufficient documentation

## 2014-01-26 DIAGNOSIS — I10 Essential (primary) hypertension: Secondary | ICD-10-CM

## 2014-01-26 NOTE — Progress Notes (Signed)
BP 136/84  Pulse 80  Temp(Src) 98.1 F (36.7 C) (Oral)  Ht 5\' 7"  (1.702 m)  Wt 250 lb 12 oz (113.739 kg)  BMI 39.26 kg/m2   CC: medicare wellness visit  Subjective:    Patient ID: [name], male    DOB: Jan 26, 1951, 63 y.o.   MRN: [id number]  HPI: [name] is a 63 y.o. male presenting on 01/26/2014 for Annual Exam   DM - sugar recently elevated. Compliant with metformin 1000mg  bid. Brings log. Fasting 110-140, post prandial 70-130.   Passes hearing and vision screens.  Denies falls in last year.  Denies depression/anhedonia, sadness  Preventative: Colon cancer screening - colonoscopy 09/2011 - severe diverticulosis, int hemorrhoids, rec rpt 5 yrs Carlean Purl) Prostate cancer screening - saw Dr. Karsten Ro - released from his care. H/o elevated in the past. Flu - today  Pneumovax 05/2012  Td 2005 , Tdap today  zostavax 11/2010  Advanced directive - has daughter as HCPOA. Has not discussed this with daughter. Does not have advanced directive.  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  Allergies and medications reviewed and updated. Current Outpatient Prescriptions on File Prior to Visit  Medication Sig  . aspirin EC 81 MG tablet Take 81 mg by mouth daily.  Marland Kitchen atorvastatin (LIPITOR) 10 MG tablet Take 1 tablet (10 mg total) by mouth daily.  . baclofen (LIORESAL) 10 MG tablet Take 1 tablet (10 mg total) by mouth at bedtime as needed (for back spasms).  Marland Kitchen glucose blood (ONE TOUCH ULTRA TEST) test strip USE TO TEST BLOOD SUGAR TWICE DAILY AS DIRECTED Dx:250.02  . losartan (COZAAR) 25 MG tablet Take 1 tablet (25 mg total)  by mouth daily.  . metFORMIN (GLUCOPHAGE) 1000 MG tablet Take 1 tablet (1,000 mg total) by mouth 2 (two) times daily with a meal.  . omeprazole (PRILOSEC) 40 MG capsule Take 1 capsule (40 mg total) by mouth daily.  Marland Kitchen oxyCODONE-acetaminophen (PERCOCET) 10-325 MG per tablet Take 1-2 tablets by mouth every 4 (four) hours as needed for pain.  . tamsulosin (FLOMAX) 0.4 MG CAPS capsule Take 1 capsule (0.4 mg total) by mouth daily after supper.  . diclofenac sodium (VOLTAREN) 1 % GEL Apply 1 application topically 3 (three) times daily.   No current facility-administered medications on file prior to visit.    Review of Systems Per HPI unless specifically indicated above    Objective:    BP 136/84  Pulse 80  Temp(Src) 98.1 F (36.7 C) (Oral)  Ht 5\' 7"  (1.702 m)  Wt 250 lb 12 oz (113.739 kg)  BMI 39.26 kg/m2  Physical Exam  Nursing note and vitals reviewed. Constitutional: He is oriented to person, place, and time. He appears well-developed and well-nourished. No distress.  HENT:  Head: Normocephalic and atraumatic.  Right Ear: Hearing, tympanic membrane, external ear and ear canal normal.  Left Ear: Hearing, tympanic membrane, external ear and ear canal normal.  Nose: Nose normal.  Mouth/Throat: Uvula is midline, oropharynx is clear and moist and mucous membranes are normal. No oropharyngeal exudate, posterior oropharyngeal edema or posterior oropharyngeal erythema.  Eyes: Conjunctivae and EOM are normal. Pupils are equal, round, and reactive to light. No  scleral icterus.  Neck: Normal range of motion. Neck supple.  Cardiovascular: Normal rate, regular rhythm, normal heart sounds and intact distal pulses.   No murmur heard. Pulses:      Radial pulses are 2+ on the right side, and 2+ on the left side.  Pulmonary/Chest: Effort normal and breath sounds normal. No respiratory distress. He has no wheezes. He has no rales.  Abdominal: Soft. Bowel sounds are normal. He exhibits no distension  and no mass. There is no tenderness. There is no rebound and no guarding.  Genitourinary: Rectum normal and prostate normal. Rectal exam shows no external hemorrhoid, no internal hemorrhoid, no fissure, no mass, no tenderness and anal tone normal. Prostate is not enlarged (15gm) and not tender.  Musculoskeletal: Normal range of motion. He exhibits no edema.  Lymphadenopathy:    He has no cervical adenopathy.  Neurological: He is alert and oriented to person, place, and time.  CN grossly intact, station and gait intact  Skin: Skin is warm and dry. No rash noted.  Psychiatric: He has a normal mood and affect. His behavior is normal. Judgment and thought content normal.   Results for orders placed in visit on 11/05/13  CBC WITH DIFFERENTIAL      Result Value Ref Range   WBC 6.3  4.0 - 10.5 K/uL   RBC 4.43  4.22 - 5.81 Mil/uL   Hemoglobin 13.3  13.0 - 17.0 g/dL   HCT 40.1  39.0 - 52.0 %   MCV 90.4  78.0 - 100.0 fl   MCHC 33.0  30.0 - 36.0 g/dL   RDW 15.1  11.5 - 15.5 %   Platelets 271.0  150.0 - 400.0 K/uL   Neutrophils Relative % 56.2  43.0 - 77.0 %   Lymphocytes Relative 30.3  12.0 - 46.0 %   Monocytes Relative 8.7  3.0 - 12.0 %   Eosinophils Relative 4.0  0.0 - 5.0 %   Basophils Relative 0.8  0.0 - 3.0 %   Neutro Abs 3.6  1.4 - 7.7 K/uL   Lymphs Abs 1.9  0.7 - 4.0 K/uL   Monocytes Absolute 0.6  0.1 - 1.0 K/uL   Eosinophils Absolute 0.3  0.0 - 0.7 K/uL   Basophils Absolute 0.1  0.0 - 0.1 K/uL  BASIC METABOLIC PANEL      Result Value Ref Range   Sodium 136  135 - 145 mEq/L   Potassium 4.2  3.5 - 5.1 mEq/L   Chloride 103  96 - 112 mEq/L   CO2 25  19 - 32 mEq/L   Glucose, Bld 168 (*) 70 - 99 mg/dL   BUN 11  6 - 23 mg/dL   Creatinine, Ser 0.9  0.4 - 1.5 mg/dL   Calcium 9.0  8.4 - 10.5 mg/dL   GFR 110.77  >60.00 mL/min  HEMOGLOBIN A1C      Result Value Ref Range   Hemoglobin A1C 7.9 (*) 4.6 - 6.5 %  LIPID PANEL      Result Value Ref Range   Cholesterol 109  0 - 200 mg/dL    Triglycerides 91.0  0.0 - 149.0 mg/dL   HDL 30.10 (*) >39.00 mg/dL   VLDL 18.2  0.0 - 40.0 mg/dL   LDL Cholesterol 61  0 - 99 mg/dL   Total CHOL/HDL Ratio 4     NonHDL 78.90    PSA      Result Value Ref Range   PSA 0.99  0.10 - 4.00 ng/mL  Assessment & Plan:   Problem List Items Addressed This Visit   Medicare annual wellness visit, subsequent - Primary     I have personally reviewed the Medicare Annual Wellness questionnaire and have noted 1. The patient's medical and social history 2. Their use of alcohol, tobacco or illicit drugs 3. Their current medications and supplements 4. The patient's functional ability including ADL's, fall risks, home safety risks and hearing or visual impairment. 5. Diet and physical activity 6. Evidence for depression or mood disorders The patients weight, height, BMI have been recorded in the chart.  Hearing and vision has been addressed. I have made referrals, counseling and provided education to the patient based review of the above and I have provided the pt with a written personalized care plan for preventive services. Provider list updated - see scanned questionairre.  Reviewed preventative protocols and updated unless pt declined.    HYPERCHOLESTEROLEMIA, PURE     Chronic, stable. Continue statin.    Essential hypertension     Chronic, stable. Continue regimen.    Diabetes type 2, uncontrolled     Reviewed latest A1c - and log he brings. Anticipate better control based on log he brings. Check A1c next lab visit, and if >7% start SU. Pt agrees with plan.    Relevant Orders      Hemoglobin A1c   ANXIETY DEPRESSION     Stable off meds.    Advanced care planning/counseling discussion     Advanced directive - has daughter as HCPOA. Has not discussed this with daughter. Does not have advanced directive.        Follow up plan: Return in about 4 months (around 05/29/2014), or as needed, for follow up.

## 2014-01-26 NOTE — Progress Notes (Signed)
Pre visit review using our clinic review tool, if applicable. No additional management support is needed unless otherwise documented below in the visit note. 

## 2014-01-26 NOTE — Assessment & Plan Note (Signed)
Chronic, stable. Continue regimen. 

## 2014-01-26 NOTE — Assessment & Plan Note (Signed)
Chronic, stable. Continue statin.  

## 2014-01-26 NOTE — Assessment & Plan Note (Signed)
Stable off meds. ?

## 2014-01-26 NOTE — Addendum Note (Signed)
Addended by: Royann Shivers A on: 01/26/2014 11:12 AM   Modules accepted: Orders

## 2014-01-26 NOTE — Assessment & Plan Note (Signed)
Advanced directive - has daughter as HCPOA. Has not discussed this with daughter. Does not have advanced directive.

## 2014-01-26 NOTE — Patient Instructions (Addendum)
Flu shot today. Tdap today. Let's return in 2 weeks for recheck labwork (A1c). Advanced directive packet provided today. Return as needed or in 4 months for diabetes follow up.

## 2014-01-26 NOTE — Assessment & Plan Note (Signed)

## 2014-01-26 NOTE — Assessment & Plan Note (Signed)
Reviewed latest A1c - and log he brings. Anticipate better control based on log he brings. Check A1c next lab visit, and if >7% start SU. Pt agrees with plan.

## 2014-01-27 ENCOUNTER — Telehealth: Payer: Self-pay | Admitting: Family Medicine

## 2014-01-27 NOTE — Telephone Encounter (Signed)
emmi mailed  °

## 2014-02-09 ENCOUNTER — Other Ambulatory Visit (INDEPENDENT_AMBULATORY_CARE_PROVIDER_SITE_OTHER): Payer: Commercial Managed Care - HMO

## 2014-02-09 DIAGNOSIS — E1165 Type 2 diabetes mellitus with hyperglycemia: Secondary | ICD-10-CM

## 2014-02-09 DIAGNOSIS — IMO0002 Reserved for concepts with insufficient information to code with codable children: Secondary | ICD-10-CM

## 2014-02-09 LAB — HEMOGLOBIN A1C: Hgb A1c MFr Bld: 7.5 % — ABNORMAL HIGH (ref 4.6–6.5)

## 2014-02-10 ENCOUNTER — Other Ambulatory Visit: Payer: Self-pay | Admitting: *Deleted

## 2014-02-10 MED ORDER — GLIMEPIRIDE 1 MG PO TABS: 0.5000 mg | ORAL_TABLET | Freq: Every day | ORAL | Status: DC

## 2014-02-16 ENCOUNTER — Telehealth: Payer: Self-pay

## 2014-02-16 MED ORDER — GLIPIZIDE ER 2.5 MG PO TB24: 2.5000 mg | ORAL_TABLET | Freq: Every day | ORAL | Status: DC

## 2014-02-16 NOTE — Telephone Encounter (Signed)
Patient returned your call.  Please call him back at [phone].

## 2014-02-16 NOTE — Telephone Encounter (Signed)
I'm sorry med seems to be causing side effects. I would recommend stopping glimepiride and trial of glipizide XL 2.5mg  once daily in am with breakfast. Again watch for low sugars on this med.

## 2014-02-16 NOTE — Telephone Encounter (Signed)
Message left for patient to return my call.  

## 2014-02-16 NOTE — Telephone Encounter (Signed)
Patient notified and verbalized understanding. 

## 2014-02-16 NOTE — Telephone Encounter (Signed)
Pt said taking Glimepiride 1 mg taking 1/2 tab at breakfast for 4 days; Pt has headache for 30 - 45 mins after taking med and pt has on and off lower back pain since started taking Glimepiride. Pt request cb. [facility] pharmacy.

## 2014-02-19 ENCOUNTER — Telehealth: Payer: Self-pay | Admitting: *Deleted

## 2014-02-19 NOTE — Telephone Encounter (Signed)
Clarification form from pharmacy in your IN box for completion.

## 2014-02-19 NOTE — Telephone Encounter (Signed)
Filled and faxed back. Then placed in my out box.

## 2014-04-08 ENCOUNTER — Emergency Department (HOSPITAL_COMMUNITY): Payer: Commercial Managed Care - HMO

## 2014-04-08 ENCOUNTER — Emergency Department (HOSPITAL_COMMUNITY)
Admission: EM | Admit: 2014-04-08 | Discharge: 2014-04-08 | Disposition: A | Discharge status: Home / Self Care | Payer: Commercial Managed Care - HMO | Attending: Emergency Medicine | Admitting: Emergency Medicine

## 2014-04-08 ENCOUNTER — Encounter (HOSPITAL_COMMUNITY): Payer: Self-pay | Admitting: Emergency Medicine

## 2014-04-08 DIAGNOSIS — G8929 Other chronic pain: Secondary | ICD-10-CM | POA: Insufficient documentation

## 2014-04-08 DIAGNOSIS — E119 Type 2 diabetes mellitus without complications: Secondary | ICD-10-CM | POA: Insufficient documentation

## 2014-04-08 DIAGNOSIS — Y998 Other external cause status: Secondary | ICD-10-CM | POA: Insufficient documentation

## 2014-04-08 DIAGNOSIS — Z7982 Long term (current) use of aspirin: Secondary | ICD-10-CM | POA: Diagnosis not present

## 2014-04-08 DIAGNOSIS — Z87448 Personal history of other diseases of urinary system: Secondary | ICD-10-CM | POA: Diagnosis not present

## 2014-04-08 DIAGNOSIS — K219 Gastro-esophageal reflux disease without esophagitis: Secondary | ICD-10-CM | POA: Insufficient documentation

## 2014-04-08 DIAGNOSIS — Z791 Long term (current) use of non-steroidal anti-inflammatories (NSAID): Secondary | ICD-10-CM | POA: Insufficient documentation

## 2014-04-08 DIAGNOSIS — S99911A Unspecified injury of right ankle, initial encounter: Secondary | ICD-10-CM | POA: Diagnosis not present

## 2014-04-08 DIAGNOSIS — M25571 Pain in right ankle and joints of right foot: Secondary | ICD-10-CM

## 2014-04-08 DIAGNOSIS — Z9889 Other specified postprocedural states: Secondary | ICD-10-CM | POA: Insufficient documentation

## 2014-04-08 DIAGNOSIS — Z79899 Other long term (current) drug therapy: Secondary | ICD-10-CM | POA: Diagnosis not present

## 2014-04-08 DIAGNOSIS — S8011XA Contusion of right lower leg, initial encounter: Secondary | ICD-10-CM | POA: Diagnosis not present

## 2014-04-08 DIAGNOSIS — S8991XA Unspecified injury of right lower leg, initial encounter: Secondary | ICD-10-CM | POA: Diagnosis present

## 2014-04-08 DIAGNOSIS — I10 Essential (primary) hypertension: Secondary | ICD-10-CM | POA: Insufficient documentation

## 2014-04-08 DIAGNOSIS — Z8739 Personal history of other diseases of the musculoskeletal system and connective tissue: Secondary | ICD-10-CM | POA: Insufficient documentation

## 2014-04-08 DIAGNOSIS — Y9289 Other specified places as the place of occurrence of the external cause: Secondary | ICD-10-CM | POA: Insufficient documentation

## 2014-04-08 DIAGNOSIS — E785 Hyperlipidemia, unspecified: Secondary | ICD-10-CM | POA: Insufficient documentation

## 2014-04-08 DIAGNOSIS — Z8673 Personal history of transient ischemic attack (TIA), and cerebral infarction without residual deficits: Secondary | ICD-10-CM | POA: Diagnosis not present

## 2014-04-08 DIAGNOSIS — W010XXA Fall on same level from slipping, tripping and stumbling without subsequent striking against object, initial encounter: Secondary | ICD-10-CM | POA: Diagnosis not present

## 2014-04-08 DIAGNOSIS — Z8659 Personal history of other mental and behavioral disorders: Secondary | ICD-10-CM | POA: Insufficient documentation

## 2014-04-08 DIAGNOSIS — Y9389 Activity, other specified: Secondary | ICD-10-CM | POA: Insufficient documentation

## 2014-04-08 DIAGNOSIS — R52 Pain, unspecified: Secondary | ICD-10-CM

## 2014-04-08 MED ORDER — TRAMADOL HCL 50 MG PO TABS: 50.0000 mg | ORAL_TABLET | Freq: Four times a day (QID) | ORAL | Status: DC | PRN

## 2014-04-08 NOTE — ED Notes (Signed)
Patient had not returned from Xray.

## 2014-04-08 NOTE — Discharge Instructions (Signed)
Contusion °A contusion is a deep bruise. Contusions are the result of an injury that caused bleeding under the skin. The contusion may turn blue, purple, or yellow. Minor injuries will give you a painless contusion, but more severe contusions may stay painful and swollen for a few weeks.  °CAUSES  °A contusion is usually caused by a blow, trauma, or direct force to an area of the body. °SYMPTOMS  °· Swelling and redness of the injured area. °· Bruising of the injured area. °· Tenderness and soreness of the injured area. °· Pain. °DIAGNOSIS  °The diagnosis can be made by taking a history and physical exam. An X-ray, CT scan, or MRI may be needed to determine if there were any associated injuries, such as fractures. °TREATMENT  °Specific treatment will depend on what area of the body was injured. In general, the best treatment for a contusion is resting, icing, elevating, and applying cold compresses to the injured area. Over-the-counter medicines may also be recommended for pain control. Ask your caregiver what the best treatment is for your contusion. °HOME CARE INSTRUCTIONS  °· Put ice on the injured area. °¨ Put ice in a plastic bag. °¨ Place a towel between your skin and the bag. °¨ Leave the ice on for 15-20 minutes, 3-4 times a day, or as directed by your health care provider. °· Only take over-the-counter or prescription medicines for pain, discomfort, or fever as directed by your caregiver. Your caregiver may recommend avoiding anti-inflammatory medicines (aspirin, ibuprofen, and naproxen) for 48 hours because these medicines may increase bruising. °· Rest the injured area. °· If possible, elevate the injured area to reduce swelling. °SEEK IMMEDIATE MEDICAL CARE IF:  °· You have increased bruising or swelling. °· You have pain that is getting worse. °· Your swelling or pain is not relieved with medicines. °MAKE SURE YOU:  °· Understand these instructions. °· Will watch your condition. °· Will get help right  away if you are not doing well or get worse. °Document Released: 01/17/2005 Document Revised: 04/14/2013 Document Reviewed: 02/12/2011 °Exit[employer]® Patient Information ©2015 [employer]. This information is not intended to replace advice given to you by your health care provider. Make sure you discuss any questions you have with your health care provider. ° °

## 2014-04-08 NOTE — ED Notes (Signed)
Tripped and fell, c/o RLE pain, no swelling or deformity, A/O X4, Ambulatory and in NAD

## 2014-04-08 NOTE — ED Provider Notes (Signed)
CSN: [id number]     Arrival date & time 04/08/14  1741 History  This chart was scribed for Margarita Mail, PA-C working with Orpah Greek, * by Randa Evens, ED Scribe. This patient was seen in room TR11C/TR11C and the patient's care was started at 8:19 PM.     Chief Complaint  Patient presents with  . Fall   Patient is a 63 y.o. male presenting with fall. The history is provided by the patient. No language interpreter was used.  Fall   HPI Comments: [name] is a 63 y.o. male who presents to the Emergency Department complaining of fall onset today at 5:30 PTA. Pt states that he tripped and fell after stepping down into a water meter hole. Pt is complaining of right leg pain. Pt describes the pain as a burning sensation. Pt states the pain radiates down into his right foot. Pt doesn't report numbness or tingling.    Past Medical History  Diagnosis Date  . GERD (gastroesophageal reflux disease)   . Hyperlipidemia   . Hypertension     Essential  . Anxiety   . Depression   . TIA (transient ischemic attack)     cocaine related  . T2DM (type 2 diabetes mellitus)   . Esophageal stricture 06/2007    dilation of stricture  . History of epididymitis   . Back pain     with radiculopathy  . Circadian rhythm sleep disorder, irregular sleep-wake type   . Abdominal wall hernia   . History of cocaine use 2004  . Complication of anesthesia     hard to put patient to sleep  . Chest pain, non-cardiac 2008    adenosine myoview low risk.  EF 60%.  normal cath 2008, deemed atypical /noncardiac, ?discogenic  . Diverticulosis 2013    by CT scan  . History of prostatitis 08/2011    ?chronic/simmering, treated with 1 mo cipro  . Postlaminectomy syndrome of cervical region     s/p ACDF C5-7 2010 ([facility])  . Gross hematuria 2013    negative CT, cystoscopy, cytology  . Bladder wall thickening 2013    nl rpt CT scan, normal cytology and UCx, released from urology Karsten Ro)  .  OSA (obstructive sleep apnea) 2010    mild off CPAP  . Chronic pain     narcotic dependent [name])   Past Surgical History  Procedure Laterality Date  . Tonsillectomy  childhood  . Carpal tunnel release      Right  . Cardiac catheterization  2008    WNL   . Trigger finger release      Right long finger  . Inguinal hernia repair      bilateral, 5 cm. RLQ Abd Lipoma Excision (Dr. Lucia Gaskins)  . Esophagogastroduodenoscopy  07/17/07    Esoph. Stricture dilated (Dr. Henrene Pastor)  . Anterior cervical discectomy  10/28/2008    with fusion (Dr. Rolena Infante)  . Hematoma evacuation  10/29/2008    (Dr. Rolena Infante)  . Direct laryngoscopy  11/02/2008    Extubation under anesthesia (Dr. Erik Obey)  . Colonoscopy  10/11/2011    severe diverticulosis, int hemorrhoids, rec rpt 5 yrs Carlean Purl)  . Esophagogastroduodenoscopy  10/11/2011    WNL Carlean Purl)  . Shoulder surgery Right 2013    replaced  . Shoulder arthroscopy with subacromial decompression and open rotator c Left 01/23/2013    Procedure: LEFT SHOULDER ARTHROSCOPY WITH SUBACROMIAL DECOMPRESSION AND MINI OPEN ROTATOR CUFF REPAIR, AND OPEN DISTAL CLAVICLE RESECTION;  Surgeon: Augustin Schooling,  MD;  Location: [facility];  Service: Orthopedics;  Laterality: Left;   Family History  Problem Relation Age of Onset  . Diabetes Mother   . Hypertension Mother   . Hyperlipidemia Mother   . Diabetes Father   . Diabetes Sister   . Hypertension Sister   . Hypertension Brother   . Diabetes Brother   . Heart disease Maternal Uncle     Heart failure  . Cancer Paternal Uncle     Prostate  . Cancer Paternal Grandfather     Prostate  . Stroke Neg Hx   . Diabetes Brother   . Hypertension Brother   . Diabetes Brother    History  Substance Use Topics  . Smoking status: Never Smoker   . Smokeless tobacco: Never Used  . Alcohol Use: No     Comment: Occasionally    Review of Systems  Constitutional: Negative for fever and chills.  Musculoskeletal: Positive for  arthralgias. Negative for gait problem.  Neurological: Negative for weakness and numbness.    Allergies  Glimepiride and Menthol (topical analgesic)  Home Medications   Prior to Admission medications   Medication Sig Start Date End Date Taking? Authorizing Provider  aspirin EC 81 MG tablet Take 81 mg by mouth daily. 10/25/11   Thurnell Lose, MD  atorvastatin (LIPITOR) 10 MG tablet Take 1 tablet (10 mg total) by mouth daily. 11/17/13   Ria Bush, MD  baclofen (LIORESAL) 10 MG tablet Take 1 tablet (10 mg total) by mouth at bedtime as needed (for back spasms). 11/17/13   Ria Bush, MD  diclofenac sodium (VOLTAREN) 1 % GEL Apply 1 application topically 3 (three) times daily. 09/23/13   Ria Bush, MD  glipiZIDE (GLUCOTROL XL) 2.5 MG 24 hr tablet Take 1 tablet (2.5 mg total) by mouth daily with breakfast. 02/16/14   Ria Bush, MD  glucose blood (ONE TOUCH ULTRA TEST) test strip USE TO TEST BLOOD SUGAR TWICE DAILY AS DIRECTED Dx:250.02 11/10/13   Ria Bush, MD  losartan (COZAAR) 25 MG tablet Take 1 tablet (25 mg total) by mouth daily. 11/17/13   Ria Bush, MD  metFORMIN (GLUCOPHAGE) 1000 MG tablet Take 1 tablet (1,000 mg total) by mouth 2 (two) times daily with a meal. 11/17/13   Ria Bush, MD  omeprazole (PRILOSEC) 40 MG capsule Take 1 capsule (40 mg total) by mouth daily. 12/11/13   Ria Bush, MD  oxyCODONE-acetaminophen (PERCOCET) 10-325 MG per tablet Take 1-2 tablets by mouth every 4 (four) hours as needed for pain. 01/23/13   Augustin Schooling, MD  tamsulosin (FLOMAX) 0.4 MG CAPS capsule Take 1 capsule (0.4 mg total) by mouth daily after supper. 11/17/13   Ria Bush, MD   Triage Vitals: BP 150/81 mmHg  Pulse 94  Temp(Src) 97.6 F (36.4 C) (Oral)  Resp 18  Ht 5\' 8"  (1.727 m)  Wt 235 lb (106.595 kg)  BMI 35.74 kg/m2  SpO2 98%  Physical Exam  Constitutional: He is oriented to person, place, and time. He appears well-developed and  well-nourished. No distress.  HENT:  Head: Normocephalic and atraumatic.  Eyes: Conjunctivae and EOM are normal.  Neck: Neck supple. No tracheal deviation present.  Cardiovascular: Normal rate.   Pulmonary/Chest: Effort normal. No respiratory distress.  Musculoskeletal: Normal range of motion.       Legs: Neurological: He is alert and oriented to person, place, and time.  Skin: Skin is warm and dry.  Psychiatric: He has a normal mood and affect. His behavior  is normal.  Nursing note and vitals reviewed.   ED Course  Procedures (including critical care time) DIAGNOSTIC STUDIES: Oxygen Saturation is 98% on RA, normal by my interpretation.    COORDINATION OF CARE: 8:45 PM-Discussed treatment plan with pt at bedside and pt agreed to plan.     Labs Review Labs Reviewed - No data to display  Imaging Review Dg Tibia/fibula Right  04/08/2014   CLINICAL DATA:  RIGHT leg injury today.  Leg pain.  EXAM: RIGHT TIBIA AND FIBULA - 2 VIEW  COMPARISON:  None.  FINDINGS: LEFT tibia and fibula appear within normal limits. Negative for fracture. No radiopaque foreign body. 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  IMPRESSION: No acute osseous abnormality.   Electronically Signed   By: Dereck Ligas M.D.   On: 04/08/2014 20:08     EKG Interpretation None      MDM   Final diagnoses:  Contusion of leg, right, initial encounter  Ankle pain, right   Patient X-Ray negative for obvious fracture or dislocation. Pain managed in ED. Pt advised to follow up with orthopedics if symptoms persist for possibility of missed fracture diagnosis. Patient given brace while in ED, conservative therapy recommended and discussed. Patient will be dc home & is agreeable with above plan.   I personally performed the services described in this documentation, which was scribed in my presence. The recorded information has been reviewed and is accurate.   Margarita Mail,  PA-C 04/08/14 2053  Margarita Mail, PA-C 04/08/14 2056  Orpah Greek, MD 04/12/14 [phone]

## 2014-04-08 NOTE — ED Notes (Signed)
Abigail PA at the bedside.

## 2014-04-23 HISTORY — PX: CATARACT EXTRACTION W/ INTRAOCULAR LENS  IMPLANT, BILATERAL: SHX1307

## 2014-04-29 ENCOUNTER — Other Ambulatory Visit: Payer: Self-pay | Admitting: Family Medicine

## 2014-05-31 ENCOUNTER — Encounter: Payer: Self-pay | Admitting: Family Medicine

## 2014-05-31 ENCOUNTER — Ambulatory Visit (INDEPENDENT_AMBULATORY_CARE_PROVIDER_SITE_OTHER): Payer: Commercial Managed Care - HMO | Admitting: Family Medicine

## 2014-05-31 VITALS — BP 128/82 | HR 84 | Temp 98.2°F | Wt 241.8 lb

## 2014-05-31 DIAGNOSIS — E1140 Type 2 diabetes mellitus with diabetic neuropathy, unspecified: Secondary | ICD-10-CM

## 2014-05-31 DIAGNOSIS — E669 Obesity, unspecified: Secondary | ICD-10-CM

## 2014-05-31 DIAGNOSIS — E1142 Type 2 diabetes mellitus with diabetic polyneuropathy: Secondary | ICD-10-CM

## 2014-05-31 DIAGNOSIS — E7800 Pure hypercholesterolemia, unspecified: Secondary | ICD-10-CM

## 2014-05-31 DIAGNOSIS — E118 Type 2 diabetes mellitus with unspecified complications: Secondary | ICD-10-CM

## 2014-05-31 DIAGNOSIS — E1165 Type 2 diabetes mellitus with hyperglycemia: Secondary | ICD-10-CM

## 2014-05-31 DIAGNOSIS — IMO0002 Reserved for concepts with insufficient information to code with codable children: Secondary | ICD-10-CM

## 2014-05-31 DIAGNOSIS — I10 Essential (primary) hypertension: Secondary | ICD-10-CM

## 2014-05-31 DIAGNOSIS — K219 Gastro-esophageal reflux disease without esophagitis: Secondary | ICD-10-CM

## 2014-05-31 HISTORY — DX: Type 2 diabetes mellitus with diabetic neuropathy, unspecified: E11.40

## 2014-05-31 LAB — HEMOGLOBIN A1C: Hgb A1c MFr Bld: 7.1 % — ABNORMAL HIGH (ref 4.6–6.5)

## 2014-05-31 MED ORDER — METFORMIN HCL 850 MG PO TABS: 850.0000 mg | ORAL_TABLET | Freq: Two times a day (BID) | ORAL | Status: DC

## 2014-05-31 MED ORDER — GLIPIZIDE ER 2.5 MG PO TB24: 2.5000 mg | ORAL_TABLET | Freq: Every day | ORAL | Status: DC

## 2014-05-31 MED ORDER — OMEPRAZOLE 40 MG PO CPDR: 40.0000 mg | DELAYED_RELEASE_CAPSULE | Freq: Every day | ORAL | Status: DC

## 2014-05-31 MED ORDER — TAMSULOSIN HCL 0.4 MG PO CAPS: 0.4000 mg | ORAL_CAPSULE | Freq: Every day | ORAL | Status: DC

## 2014-05-31 MED ORDER — GABAPENTIN 300 MG PO CAPS: 300.0000 mg | ORAL_CAPSULE | Freq: Two times a day (BID) | ORAL | Status: DC

## 2014-05-31 MED ORDER — BACLOFEN 10 MG PO TABS: 10.0000 mg | ORAL_TABLET | Freq: Every evening | ORAL | Status: DC | PRN

## 2014-05-31 NOTE — Assessment & Plan Note (Signed)
Omeprazole refilled.

## 2014-05-31 NOTE — Assessment & Plan Note (Signed)
Chronic, stable. Continue lipitor 10mg daily. 

## 2014-05-31 NOTE — Assessment & Plan Note (Signed)
Describes peripheral neuropathy - will trial gabapentin. Discussed sedation precautions

## 2014-05-31 NOTE — Assessment & Plan Note (Signed)
Not regular with exercise - recommend he start new exercise routine.

## 2014-05-31 NOTE — Progress Notes (Signed)
Pre visit review using our clinic review tool, if applicable. No additional management support is needed unless otherwise documented below in the visit note. 

## 2014-05-31 NOTE — Patient Instructions (Addendum)
Let's continue glipizide 2.5mg  XL once daily with breakfast, decrease metformin to 850mg  twice daily (new dose at pharmacy). Start gabapentin 300mg  nightly. If tolerated well, may increase to 300mg  twice daily. This should help burning foot pain. Restart regular exercise routine. Return in 4 mo for f/u visit Blood work today.

## 2014-05-31 NOTE — Assessment & Plan Note (Signed)
Chronic, stable. Continue regimen. Reviewed latest A1c along with log he brings. Overall anticipate great control. Check A1c today.  Foot exam today - start gabapentin for endorsed neuropathy.

## 2014-05-31 NOTE — Assessment & Plan Note (Signed)
Chronic, stable. Continue low dose losartan.  

## 2014-05-31 NOTE — Progress Notes (Signed)
BP 128/82 mmHg  Pulse 84  Temp(Src) 98.2 F (36.8 C) (Oral)  Wt 241 lb 12 oz (109.657 kg)   CC: 4 mo f/u visit  Subjective:    Patient ID: [name], male    DOB: 09/06/1950, 64 y.o.   MRN: [id number]  HPI: [name] is a 64 y.o. male presenting on 05/31/2014 for Follow-up   Reviewed recent ER visit for fall with leg contusion.   DM - regularly does check sugars and brings log: fasting 80-110, random 100-140.  Compliant with antihyperglycemic regimen which includes: metformin 1000mg  bid and glipizide XL 2.5mg  daily.  Glimepiride caused headaches. Few low sugars or hypoglycemic symptoms.  + paresthesias R>L foot with burning and tingling, no numbness. Also describes sharp pain R foot that radiates up to knee. Last diabetic eye exam 06/2013.  Pneumovax: 05/2012.  Prevnar: not due yet. Lab Results  Component Value Date   HGBA1C 7.5* 02/09/2014   Diabetic Foot Exam - Simple   Simple Foot Form  Diabetic Foot exam was performed with the following findings:  Yes 05/31/2014  8:26 AM  Visual Inspection  No deformities, no ulcerations, no other skin breakdown bilaterally:  Yes  Sensation Testing  See comments:  Yes  Pulse Check  Posterior Tibialis and Dorsalis pulse intact bilaterally:  Yes  Comments  Slightly decreased sensation to monofilament testing     HLD - compliant with lipitor 10mg  daily. Lab Results  Component Value Date   LDLCALC 61 11/05/2013   HTN - compliant with losartan 25mg  daily. Tolerating well without myalgias.  GERD - requests refill of omeprazole which is helpful.  Obesity - no regular exercise. Body mass index is 36.77 kg/(m^2).  Relevant past medical, surgical, family and social history reviewed and updated as indicated. Interim medical history since our last visit reviewed. Allergies and medications reviewed and updated. Current Outpatient Prescriptions on File Prior to Visit  Medication Sig  . aspirin EC 81 MG tablet Take 81 mg by mouth  daily.  Marland Kitchen atorvastatin (LIPITOR) 10 MG tablet Take 1 tablet (10 mg total) by mouth daily.  Marland Kitchen losartan (COZAAR) 25 MG tablet Take 1 tablet (25 mg total) by mouth daily.  . ONE TOUCH ULTRA TEST test strip USE  TO  TEST  BLOOD  SUGAR TWICE DAILY AS DIRECTED  . oxyCODONE-acetaminophen (PERCOCET) 10-325 MG per tablet Take 1-2 tablets by mouth every 4 (four) hours as needed for pain.   No current facility-administered medications on file prior to visit.    Review of Systems Per HPI unless specifically indicated above     Objective:    BP 128/82 mmHg  Pulse 84  Temp(Src) 98.2 F (36.8 C) (Oral)  Wt 241 lb 12 oz (109.657 kg)  Wt Readings from Last 3 Encounters:  05/31/14 241 lb 12 oz (109.657 kg)  04/08/14 235 lb (106.595 kg)  01/26/14 250 lb 12 oz (113.739 kg)    Physical Exam  Constitutional: He appears well-developed and well-nourished. No distress.  HENT:  Head: Normocephalic and atraumatic.  Right Ear: External ear normal.  Left Ear: External ear normal.  Nose: Nose normal.  Mouth/Throat: Oropharynx is clear and moist. No oropharyngeal exudate.  Eyes: Conjunctivae and EOM are normal. Pupils are equal, round, and reactive to light. No scleral icterus.  Neck: Normal range of motion. Neck supple.  Cardiovascular: Normal rate, regular rhythm, normal heart sounds and intact distal pulses.   No murmur heard. Pulmonary/Chest: Effort normal and breath sounds normal. No  respiratory distress. He has no wheezes. He has no rales.  Musculoskeletal: He exhibits no edema.  See HPI for foot exam if done  Lymphadenopathy:    He has no cervical adenopathy.  Skin: Skin is warm and dry. No rash noted.  Psychiatric: He has a normal mood and affect.  Nursing note and vitals reviewed.  Results for orders placed or performed in visit on 02/09/14  Hemoglobin A1c  Result Value Ref Range   Hgb A1c MFr Bld 7.5 (H) 4.6 - 6.5 %      Assessment & Plan:   Problem List Items Addressed This Visit      Obesity    Not regular with exercise - recommend he start new exercise routine.      Relevant Medications   glipiZIDE (GLUCOTROL XL) 24 hr tablet   metFORMIN (GLUCOPHAGE) tablet   HYPERCHOLESTEROLEMIA, PURE    Chronic, stable. Continue lipitor 10mg  daily.      GERD    Omeprazole refilled.      Relevant Medications   omeprazole (PRILOSEC) capsule   Essential hypertension    Chronic, stable. Continue low dose losartan.      Diabetic neuropathy    Describes peripheral neuropathy - will trial gabapentin. Discussed sedation precautions      Relevant Medications   glipiZIDE (GLUCOTROL XL) 24 hr tablet   metFORMIN (GLUCOPHAGE) tablet   Diabetes mellitus type 2, uncontrolled, with complications - Primary    Chronic, stable. Continue regimen. Reviewed latest A1c along with log he brings. Overall anticipate great control. Check A1c today.  Foot exam today - start gabapentin for endorsed neuropathy.      Relevant Medications   glipiZIDE (GLUCOTROL XL) 24 hr tablet   metFORMIN (GLUCOPHAGE) tablet   Other Relevant Orders   Hemoglobin A1c       Follow up plan: Return in about 4 months (around 09/29/2014), or as needed, for follow up visit.

## 2014-07-31 ENCOUNTER — Emergency Department (HOSPITAL_COMMUNITY)
Admission: EM | Admit: 2014-07-31 | Discharge: 2014-07-31 | Disposition: A | Discharge status: Home / Self Care | Payer: Commercial Managed Care - HMO | Attending: Emergency Medicine | Admitting: Emergency Medicine

## 2014-07-31 ENCOUNTER — Encounter (HOSPITAL_COMMUNITY): Payer: Self-pay | Admitting: Family Medicine

## 2014-07-31 DIAGNOSIS — Z87438 Personal history of other diseases of male genital organs: Secondary | ICD-10-CM | POA: Diagnosis not present

## 2014-07-31 DIAGNOSIS — Z9981 Dependence on supplemental oxygen: Secondary | ICD-10-CM | POA: Insufficient documentation

## 2014-07-31 DIAGNOSIS — Z8673 Personal history of transient ischemic attack (TIA), and cerebral infarction without residual deficits: Secondary | ICD-10-CM | POA: Insufficient documentation

## 2014-07-31 DIAGNOSIS — Z9889 Other specified postprocedural states: Secondary | ICD-10-CM | POA: Insufficient documentation

## 2014-07-31 DIAGNOSIS — G4733 Obstructive sleep apnea (adult) (pediatric): Secondary | ICD-10-CM | POA: Diagnosis not present

## 2014-07-31 DIAGNOSIS — G8929 Other chronic pain: Secondary | ICD-10-CM | POA: Insufficient documentation

## 2014-07-31 DIAGNOSIS — I10 Essential (primary) hypertension: Secondary | ICD-10-CM | POA: Diagnosis not present

## 2014-07-31 DIAGNOSIS — M25519 Pain in unspecified shoulder: Secondary | ICD-10-CM | POA: Insufficient documentation

## 2014-07-31 DIAGNOSIS — E785 Hyperlipidemia, unspecified: Secondary | ICD-10-CM | POA: Diagnosis not present

## 2014-07-31 DIAGNOSIS — Z7982 Long term (current) use of aspirin: Secondary | ICD-10-CM | POA: Insufficient documentation

## 2014-07-31 DIAGNOSIS — K219 Gastro-esophageal reflux disease without esophagitis: Secondary | ICD-10-CM | POA: Diagnosis not present

## 2014-07-31 DIAGNOSIS — Z8659 Personal history of other mental and behavioral disorders: Secondary | ICD-10-CM | POA: Insufficient documentation

## 2014-07-31 DIAGNOSIS — M542 Cervicalgia: Secondary | ICD-10-CM | POA: Insufficient documentation

## 2014-07-31 DIAGNOSIS — Z79899 Other long term (current) drug therapy: Secondary | ICD-10-CM | POA: Insufficient documentation

## 2014-07-31 NOTE — ED Provider Notes (Signed)
CSN: [id number]     Arrival date & time 07/31/14  1625 History  This chart was scribed for non-physician practitioner, Will Savannha Welle, PA-C working with No att. providers found by Judithann Sauger, ED Scribe. The patient was seen in room TR06C/TR06C and the patient's care was started at 5:30 PM    Chief Complaint  Patient presents with  . Neck Pain   The history is provided by the patient. No language interpreter was used.   HPI Comments: [name] is a 64 y.o. male with a hx of HTN, HLD, chronic neck pain who presents to the Emergency Department complaining of chronic neck pain and shoulder pain. He reports that he has had 2 surgeries on his neck as well as surgery on his rotator cuff. He states that he went to [facility] for a funeral and he accidentally left his medication in the hotel room. He explains that he is here for Oxycodone. He denies acute numbness or tingling, abdominal pain, N/V/D, visual disturbances, sore throat, loss of bladder control, dysuria, or hematuria. He denies a hx of cancer or IV drug use. He reports that he has taken a total of 4 325 mgTylenol tablets with no relief.   He denies trauma or injury to his back.     Past Medical History  Diagnosis Date  . GERD (gastroesophageal reflux disease)   . Hyperlipidemia   . Hypertension     Essential  . Anxiety   . Depression   . TIA (transient ischemic attack)     cocaine related  . T2DM (type 2 diabetes mellitus)   . Esophageal stricture 06/2007    dilation of stricture  . History of epididymitis   . Back pain     with radiculopathy  . Circadian rhythm sleep disorder, irregular sleep-wake type   . Abdominal wall hernia   . History of cocaine use 2004  . Complication of anesthesia     hard to put patient to sleep  . Chest pain, non-cardiac 2008    adenosine myoview low risk.  EF 60%.  normal cath 2008, deemed atypical /noncardiac, ?discogenic  . Diverticulosis 2013    by CT scan  . History of prostatitis  08/2011    ?chronic/simmering, treated with 1 mo cipro  . Postlaminectomy syndrome of cervical region     s/p ACDF C5-7 2010 ([facility])  . Gross hematuria 2013    negative CT, cystoscopy, cytology  . Bladder wall thickening 2013    nl rpt CT scan, normal cytology and UCx, released from urology Karsten Ro)  . OSA (obstructive sleep apnea) 2010    mild off CPAP  . Chronic pain     narcotic dependent ([facility])  . Chronic neck pain    Past Surgical History  Procedure Laterality Date  . Tonsillectomy  childhood  . Carpal tunnel release      Right  . Cardiac catheterization  2008    WNL   . Trigger finger release      Right long finger  . Inguinal hernia repair      bilateral, 5 cm. RLQ Abd Lipoma Excision (Dr. Lucia Gaskins)  . Esophagogastroduodenoscopy  07/17/07    Esoph. Stricture dilated (Dr. Henrene Pastor)  . Anterior cervical discectomy  10/28/2008    with fusion (Dr. Rolena Infante)  . Hematoma evacuation  10/29/2008    (Dr. Rolena Infante)  . Direct laryngoscopy  11/02/2008    Extubation under anesthesia (Dr. Erik Obey)  . Colonoscopy  10/11/2011    severe diverticulosis, int  hemorrhoids, rec rpt 5 yrs Carlean Purl)  . Esophagogastroduodenoscopy  10/11/2011    WNL Carlean Purl)  . Shoulder surgery Right 2013    replaced  . Shoulder arthroscopy with subacromial decompression and open rotator c Left 01/23/2013    Procedure: LEFT SHOULDER ARTHROSCOPY WITH SUBACROMIAL DECOMPRESSION AND MINI OPEN ROTATOR CUFF REPAIR, AND OPEN DISTAL CLAVICLE RESECTION;  Surgeon: Augustin Schooling, MD;  Location: [facility];  Service: Orthopedics;  Laterality: Left;   Family History  Problem Relation Age of Onset  . Diabetes Mother   . Hypertension Mother   . Hyperlipidemia Mother   . Diabetes Father   . Diabetes Sister   . Hypertension Sister   . Hypertension Brother   . Diabetes Brother   . Heart disease Maternal Uncle     Heart failure  . Cancer Paternal Uncle     Prostate  . Cancer Paternal Grandfather     Prostate  . Stroke Neg Hx    . Diabetes Brother   . Hypertension Brother   . Diabetes Brother    History  Substance Use Topics  . Smoking status: Never Smoker   . Smokeless tobacco: Never Used  . Alcohol Use: No     Comment: Occasionally    Review of Systems  Constitutional: Negative for fever.  HENT: Negative for sore throat and trouble swallowing.   Eyes: Negative for visual disturbance.  Gastrointestinal: Negative for nausea, vomiting, abdominal pain and diarrhea.  Genitourinary: Negative for dysuria, hematuria and difficulty urinating.  Musculoskeletal: Positive for arthralgias (shoulder pain) and neck pain. Negative for neck stiffness.  Skin: Negative for rash and wound.  Neurological: Negative for dizziness, syncope, weakness, light-headedness, numbness and headaches.      Allergies  Glimepiride and Menthol (topical analgesic)  Home Medications   Prior to Admission medications   Medication Sig Start Date End Date Taking? Authorizing Provider  aspirin EC 81 MG tablet Take 81 mg by mouth daily. 10/25/11   Thurnell Lose, MD  atorvastatin (LIPITOR) 10 MG tablet Take 1 tablet (10 mg total) by mouth daily. 11/17/13   Ria Bush, MD  baclofen (LIORESAL) 10 MG tablet Take 1 tablet (10 mg total) by mouth at bedtime as needed (for back spasms). 05/31/14   Ria Bush, MD  gabapentin (NEURONTIN) 300 MG capsule Take 1 capsule (300 mg total) by mouth 2 (two) times daily. 05/31/14   Ria Bush, MD  glipiZIDE (GLUCOTROL XL) 2.5 MG 24 hr tablet Take 1 tablet (2.5 mg total) by mouth daily with breakfast. 05/31/14   Ria Bush, MD  losartan (COZAAR) 25 MG tablet Take 1 tablet (25 mg total) by mouth daily. 11/17/13   Ria Bush, MD  metFORMIN (GLUCOPHAGE) 850 MG tablet Take 1 tablet (850 mg total) by mouth 2 (two) times daily with a meal. 05/31/14   Ria Bush, MD  omeprazole (PRILOSEC) 40 MG capsule Take 1 capsule (40 mg total) by mouth daily. 05/31/14   Ria Bush, MD  ONE TOUCH ULTRA  TEST test strip USE  TO  TEST  BLOOD  SUGAR TWICE DAILY AS DIRECTED 04/29/14   Ria Bush, MD  oxyCODONE-acetaminophen (PERCOCET) 10-325 MG per tablet Take 1-2 tablets by mouth every 4 (four) hours as needed for pain. 01/23/13   Netta Cedars, MD  tamsulosin (FLOMAX) 0.4 MG CAPS capsule Take 1 capsule (0.4 mg total) by mouth daily after supper. 05/31/14   Ria Bush, MD   BP 130/83 mmHg  Pulse 95  Temp(Src) 98.2 F (36.8 C) (Oral)  Resp 15  SpO2 98% Physical Exam  Constitutional: He is oriented to person, place, and time. He appears well-developed and well-nourished. No distress.  HENT:  Head: Normocephalic and atraumatic.  Right Ear: External ear normal.  Left Ear: External ear normal.  Eyes: Conjunctivae and EOM are normal. Pupils are equal, round, and reactive to light. Right eye exhibits no discharge. Left eye exhibits no discharge.  Neck: Neck supple. No tracheal deviation present.  Cardiovascular: Normal rate, regular rhythm, normal heart sounds and intact distal pulses.   Bilateral radial pulses are intact.  Pulmonary/Chest: Effort normal and breath sounds normal. No respiratory distress. He has no wheezes. He has no rales.  Abdominal: Soft. There is no tenderness.  Musculoskeletal: Normal range of motion. He exhibits no edema or tenderness.  Sensation intact to bilateral upper extremity.  Radial pulses intact.  Patient is spontaneously moving all extremities in a coordinated fashion exhibiting good strength. Patient is able to ambulate without difficulty or assistance.  Neurological: He is alert and oriented to person, place, and time. Coordination normal.  Skin: Skin is warm and dry. No rash noted. He is not diaphoretic.  Psychiatric: He has a normal mood and affect. His behavior is normal.  Nursing note and vitals reviewed.   ED Course  Procedures (including critical care time) DIAGNOSTIC STUDIES: Oxygen Saturation is 98% on RA, normal by my interpretation.     COORDINATION OF CARE: 5:36 PM- Pt advised of plan for treatment and pt agrees.    Labs Review Labs Reviewed - No data to display  Imaging Review No results found.   EKG Interpretation None      Filed Vitals:   07/31/14 1637  BP: 130/83  Pulse: 95  Temp: 98.2 F (36.8 C)  TempSrc: Oral  Resp: 15  SpO2: 98%     MDM   Meds given in ED:  Medications - No data to display  Discharge Medication List as of 07/31/2014  5:39 PM      Final diagnoses:  Chronic neck pain   This is a 63 year old male who presents the emergency department complaining of chronic neck and shoulder pain requesting a refill on his Percocet today. The patient reports he was down in [facility] where he left his pain medication. He is needing a refill on his pain medication today. He denies any changes to his pain. He denies any trauma to his neck or back. He has no focal neuro deficits. The patient sees pain management for his pain medications. Checked the [place] Controlled Substance Reporting System and pt received 30 day supply of Percocet 10-325 on 07/15/14. I advised I was unable to prescribe her narcotic pain medication for him today but I would be happy to help him with nonnarcotic pain medicine options. He reports using Tylenol and says he will stick with this until he can get further pain medicine. I advised him to contact his primary care provider and his pain medicine specialist. I advised the patient to follow-up with their primary care provider this week. I advised the patient to return to the emergency department with new or worsening symptoms or new concerns. The patient verbalized understanding and agreement with plan.   I personally performed the services described in this documentation, which was scribed in my presence. The recorded information has been reviewed and is accurate.    Waynetta Pean, PA-C 07/31/14 1748  Pamella Pert, MD 08/01/14 [phone]

## 2014-07-31 NOTE — ED Notes (Signed)
Pt here for chronic neck and shoulder pain. sts that he left pain meds in hotel room and needs something for pain.

## 2014-07-31 NOTE — Discharge Instructions (Signed)
Chronic Back Pain  When back pain lasts longer than 3 months, it is called chronic back pain.People with chronic back pain often go through certain periods that are more intense (flare-ups).  CAUSES Chronic back pain can be caused by wear and tear (degeneration) on different structures in your back. These structures include:  The bones of your spine (vertebrae) and the joints surrounding your spinal cord and nerve roots (facets).  The strong, fibrous tissues that connect your vertebrae (ligaments). Degeneration of these structures may result in pressure on your nerves. This can lead to constant pain. HOME CARE INSTRUCTIONS  Avoid bending, heavy lifting, prolonged sitting, and activities which make the problem worse.  Take brief periods of rest throughout the day to reduce your pain. Lying down or standing usually is better than sitting while you are resting.  Take over-the-counter or prescription medicines only as directed by your caregiver. SEEK IMMEDIATE MEDICAL CARE IF:   You have weakness or numbness in one of your legs or feet.  You have trouble controlling your bladder or bowels.  You have nausea, vomiting, abdominal pain, shortness of breath, or fainting. Document Released: 05/17/2004 Document Revised: 07/02/2011 Document Reviewed: 03/24/2011 [facility] Patient Information 2015 [place], [place]. This information is not intended to replace advice given to you by your health care provider. Make sure you discuss any questions you have with your health care provider. Musculoskeletal Pain Musculoskeletal pain is muscle and boney aches and pains. These pains can occur in any part of the body. Your caregiver may treat you without knowing the cause of the pain. They may treat you if blood or urine tests, X-rays, and other tests were normal.  CAUSES There is often not a definite cause or reason for these pains. These pains may be caused by a type of germ (virus). The discomfort may also come  from overuse. Overuse includes working out too hard when your body is not fit. Boney aches also come from weather changes. Bone is sensitive to atmospheric pressure changes. HOME CARE INSTRUCTIONS   Ask when your test results will be ready. Make sure you get your test results.  Only take over-the-counter or prescription medicines for pain, discomfort, or fever as directed by your caregiver. If you were given medications for your condition, do not drive, operate machinery or power tools, or sign legal documents for 24 hours. Do not drink alcohol. Do not take sleeping pills or other medications that may interfere with treatment.  Continue all activities unless the activities cause more pain. When the pain lessens, slowly resume normal activities. Gradually increase the intensity and duration of the activities or exercise.  During periods of severe pain, bed rest may be helpful. Lay or sit in any position that is comfortable.  Putting ice on the injured area.  Put ice in a bag.  Place a towel between your skin and the bag.  Leave the ice on for 15 to 20 minutes, 3 to 4 times a day.  Follow up with your caregiver for continued problems and no reason can be found for the pain. If the pain becomes worse or does not go away, it may be necessary to repeat tests or do additional testing. Your caregiver may need to look further for a possible cause. SEEK IMMEDIATE MEDICAL CARE IF:  You have pain that is getting worse and is not relieved by medications.  You develop chest pain that is associated with shortness or breath, sweating, feeling sick to your stomach (nauseous), or throw  up (vomit).  Your pain becomes localized to the abdomen.  You develop any new symptoms that seem different or that concern you. MAKE SURE YOU:   Understand these instructions.  Will watch your condition.  Will get help right away if you are not doing well or get worse. Document Released: 04/09/2005 Document Revised:  07/02/2011 Document Reviewed: 12/12/2012 [facility] Patient Information 2015 [place], [place]. This information is not intended to replace advice given to you by your health care provider. Make sure you discuss any questions you have with your health care provider.

## 2014-09-28 LAB — HM DIABETES EYE EXAM

## 2014-10-01 ENCOUNTER — Encounter: Payer: Self-pay | Admitting: Family Medicine

## 2014-10-01 ENCOUNTER — Ambulatory Visit (INDEPENDENT_AMBULATORY_CARE_PROVIDER_SITE_OTHER): Payer: Commercial Managed Care - HMO | Admitting: Family Medicine

## 2014-10-01 VITALS — BP 132/82 | HR 88 | Temp 98.2°F | Wt 240.2 lb

## 2014-10-01 DIAGNOSIS — E7800 Pure hypercholesterolemia, unspecified: Secondary | ICD-10-CM

## 2014-10-01 DIAGNOSIS — E1142 Type 2 diabetes mellitus with diabetic polyneuropathy: Secondary | ICD-10-CM

## 2014-10-01 DIAGNOSIS — R221 Localized swelling, mass and lump, neck: Secondary | ICD-10-CM | POA: Diagnosis not present

## 2014-10-01 DIAGNOSIS — D170 Benign lipomatous neoplasm of skin and subcutaneous tissue of head, face and neck: Secondary | ICD-10-CM

## 2014-10-01 DIAGNOSIS — J309 Allergic rhinitis, unspecified: Secondary | ICD-10-CM | POA: Diagnosis not present

## 2014-10-01 DIAGNOSIS — I10 Essential (primary) hypertension: Secondary | ICD-10-CM

## 2014-10-01 DIAGNOSIS — E1165 Type 2 diabetes mellitus with hyperglycemia: Secondary | ICD-10-CM

## 2014-10-01 DIAGNOSIS — E118 Type 2 diabetes mellitus with unspecified complications: Secondary | ICD-10-CM

## 2014-10-01 DIAGNOSIS — IMO0002 Reserved for concepts with insufficient information to code with codable children: Secondary | ICD-10-CM

## 2014-10-01 HISTORY — DX: Benign lipomatous neoplasm of skin and subcutaneous tissue of head, face and neck: D17.0

## 2014-10-01 HISTORY — DX: Allergic rhinitis, unspecified: J30.9

## 2014-10-01 MED ORDER — CETIRIZINE HCL 10 MG PO TABS
10.0000 mg | ORAL_TABLET | Freq: Every day | ORAL | Status: DC
Start: 2014-10-01 — End: 2016-08-21

## 2014-10-01 MED ORDER — OMEPRAZOLE 40 MG PO CPDR: 40.0000 mg | DELAYED_RELEASE_CAPSULE | Freq: Every day | ORAL | Status: DC

## 2014-10-01 MED ORDER — GABAPENTIN 300 MG PO CAPS: 300.0000 mg | ORAL_CAPSULE | Freq: Two times a day (BID) | ORAL | Status: DC

## 2014-10-01 MED ORDER — FLUTICASONE PROPIONATE 50 MCG/ACT NA SUSP: 2.0000 | Freq: Every day | NASAL | Status: DC

## 2014-10-01 NOTE — Patient Instructions (Addendum)
Let's keep an eye on lipoma. For congestion - start zyrtec at night time over the counter. I think this is allergy related.  You are doing well today.  Next labwork will be next visit. Return as needed or in 4 months for medicare wellness visit.

## 2014-10-01 NOTE — Progress Notes (Signed)
Pre visit review using our clinic review tool, if applicable. No additional management support is needed unless otherwise documented below in the visit note. 

## 2014-10-01 NOTE — Assessment & Plan Note (Signed)
Improvement in control noted. Continue current regimen. No labwork today.

## 2014-10-01 NOTE — Progress Notes (Signed)
BP 132/82 mmHg  Pulse 88  Temp(Src) 98.2 F (36.8 C) (Oral)  Wt 240 lb 4 oz (108.977 kg)   CC: 4 month follow up visit  Subjective:    Patient ID: [name], male    DOB: [birth year], 64 y.o.   MRN: [id number]  HPI: KRISTEN BUSHWAY is a 64 y.o. male presenting on 10/01/2014 for Follow-up   Some ongoing PNdrainage over last 2 months. Some coughing and drainage worse at night. Taking OTC remedies without improvement. No fevers/chills. No dyspnea or wheezing.  DM - regularly does check sugars; one 200 otherwise running well. Lowest 70-80s. Compliant with antihyperglycemic regimen which includes: glipizide XL 2.5mg  daily and metformin 850mg  bid.  Denies low sugars or hypoglycemic symptoms.  Denies paresthesias. Last diabetic eye exam 09/2014.  Pneumovax: 05/2012.  Prevnar: not due. Lab Results  Component Value Date   HGBA1C 7.1* 05/31/2014   Diabetic Foot Exam - Simple   No data filed      HLD-  Compliant with lipitor 10mg  daily without myalgias.   HTN - compliant with losartan 25mg  daily. No HA, vision changes, CP/tightness, SOB, leg swelling.   Relevant past medical, surgical, family and social history reviewed and updated as indicated. Interim medical history since our last visit reviewed. Allergies and medications reviewed and updated. Current Outpatient Prescriptions on File Prior to Visit  Medication Sig  . aspirin EC 81 MG tablet Take 81 mg by mouth daily.  Marland Kitchen atorvastatin (LIPITOR) 10 MG tablet Take 1 tablet (10 mg total) by mouth daily.  . baclofen (LIORESAL) 10 MG tablet Take 1 tablet (10 mg total) by mouth at bedtime as needed (for back spasms).  Marland Kitchen glipiZIDE (GLUCOTROL XL) 2.5 MG 24 hr tablet Take 1 tablet (2.5 mg total) by mouth daily with breakfast.  . losartan (COZAAR) 25 MG tablet Take 1 tablet (25 mg total) by mouth daily.  . metFORMIN (GLUCOPHAGE) 850 MG tablet Take 1 tablet (850 mg total) by mouth 2 (two) times daily with a meal.  . ONE TOUCH ULTRA TEST  test strip USE  TO  TEST  BLOOD  SUGAR TWICE DAILY AS DIRECTED  . oxyCODONE-acetaminophen (PERCOCET) 10-325 MG per tablet Take 1-2 tablets by mouth every 4 (four) hours as needed for pain.  . tamsulosin (FLOMAX) 0.4 MG CAPS capsule Take 1 capsule (0.4 mg total) by mouth daily after supper.   No current facility-administered medications on file prior to visit.    Review of Systems Per HPI unless specifically indicated above     Objective:    BP 132/82 mmHg  Pulse 88  Temp(Src) 98.2 F (36.8 C) (Oral)  Wt 240 lb 4 oz (108.977 kg)  Wt Readings from Last 3 Encounters:  10/01/14 240 lb 4 oz (108.977 kg)  05/31/14 241 lb 12 oz (109.657 kg)  04/08/14 235 lb (106.595 kg)    Physical Exam  Constitutional: He appears well-developed and well-nourished. No distress.  HENT:  Head: Normocephalic and atraumatic.  Right Ear: External ear normal.  Left Ear: External ear normal.  Nose: Nose normal.  Mouth/Throat: Oropharynx is clear and moist. No oropharyngeal exudate.  Eyes: Conjunctivae and EOM are normal. Pupils are equal, round, and reactive to light. No scleral icterus.  Neck: Normal range of motion. Neck supple.  1-2cm lipoma midline cervical neck without tenderness/erythema/fluctuance  Cardiovascular: Normal rate, regular rhythm, normal heart sounds and intact distal pulses.   No murmur heard. Pulmonary/Chest: Effort normal and breath sounds normal. No respiratory distress.  He has no wheezes. He has no rales.  Musculoskeletal: He exhibits no edema.  See HPI for foot exam if done  Lymphadenopathy:    He has no cervical adenopathy.  Skin: Skin is warm and dry. No rash noted.  Psychiatric: He has a normal mood and affect.  Nursing note and vitals reviewed.  Results for orders placed or performed in visit on 10/01/14  HM DIABETES EYE EXAM  Result Value Ref Range   HM Diabetic Eye Exam No Retinopathy No Retinopathy      Assessment & Plan:   Problem List Items Addressed This Visit     Allergic rhinitis    Treat with zyrtec nightly and flonase daily      Diabetes mellitus type 2, uncontrolled, with complications - Primary    Improvement in control noted. Continue current regimen. No labwork today.      Diabetic neuropathy    Gabapentin has helped - refilled today.      Essential hypertension    Chronic, stable. Continue losartan 25mg  daily.      HYPERCHOLESTEROLEMIA, PURE    Chronic. Continue lipitor 10mg  daily.      Lump in neck    Anticipate lipoma, doubt related to headaches. Monitor for now. No need for intervention. H/o R forearm lipoma removed by surgery          Follow up plan: Return in about 4 months (around 01/31/2015), or as needed, for medicare wellness visit.

## 2014-10-01 NOTE — Assessment & Plan Note (Signed)
Treat with zyrtec nightly and flonase daily

## 2014-10-01 NOTE — Assessment & Plan Note (Signed)
Chronic. Continue lipitor 10mg  daily.

## 2014-10-01 NOTE — Assessment & Plan Note (Signed)
Anticipate lipoma, doubt related to headaches. Monitor for now. No need for intervention. H/o R forearm lipoma removed by surgery

## 2014-10-01 NOTE — Assessment & Plan Note (Signed)
Chronic, stable. Continue losartan 25mg daily.  

## 2014-10-01 NOTE — Assessment & Plan Note (Signed)
Gabapentin has helped - refilled today.

## 2014-10-14 ENCOUNTER — Other Ambulatory Visit: Payer: Self-pay

## 2014-10-14 MED ORDER — GLUCOSE BLOOD VI STRP: ORAL_STRIP | Status: DC

## 2014-10-14 NOTE — Telephone Encounter (Signed)
Pt request refill test strips to [facility]. Advised pt done per protocol.

## 2014-10-26 ENCOUNTER — Encounter: Payer: Self-pay | Admitting: Family Medicine

## 2014-11-20 ENCOUNTER — Emergency Department (HOSPITAL_COMMUNITY)
Admission: EM | Admit: 2014-11-20 | Discharge: 2014-11-20 | Disposition: A | Discharge status: Other Acute Inpatient Hospital | Payer: Commercial Managed Care - HMO

## 2014-11-25 ENCOUNTER — Encounter: Payer: Self-pay | Admitting: Gastroenterology

## 2014-11-25 ENCOUNTER — Encounter: Payer: Self-pay | Admitting: Internal Medicine

## 2014-11-27 ENCOUNTER — Encounter: Payer: Self-pay | Admitting: Family Medicine

## 2014-12-22 ENCOUNTER — Other Ambulatory Visit: Payer: Self-pay

## 2014-12-22 DIAGNOSIS — E7800 Pure hypercholesterolemia, unspecified: Secondary | ICD-10-CM

## 2014-12-22 MED ORDER — ATORVASTATIN CALCIUM 10 MG PO TABS: 10.0000 mg | ORAL_TABLET | Freq: Every day | ORAL | Status: DC

## 2014-12-22 MED ORDER — LOSARTAN POTASSIUM 25 MG PO TABS: 25.0000 mg | ORAL_TABLET | Freq: Every day | ORAL | Status: DC

## 2014-12-22 NOTE — Telephone Encounter (Signed)
Pt request refill atorvastatin and losartan to [facility] order pharmacy. Pt seen 09/2014 for f/u. Advised pt done.

## 2015-01-02 ENCOUNTER — Encounter: Payer: Self-pay | Admitting: Family Medicine

## 2015-01-02 DIAGNOSIS — R310 Gross hematuria: Secondary | ICD-10-CM | POA: Insufficient documentation

## 2015-01-22 ENCOUNTER — Other Ambulatory Visit: Payer: Self-pay | Admitting: Family Medicine

## 2015-01-22 DIAGNOSIS — E1165 Type 2 diabetes mellitus with hyperglycemia: Secondary | ICD-10-CM

## 2015-01-22 DIAGNOSIS — N402 Nodular prostate without lower urinary tract symptoms: Secondary | ICD-10-CM

## 2015-01-22 DIAGNOSIS — E118 Type 2 diabetes mellitus with unspecified complications: Principal | ICD-10-CM

## 2015-01-22 DIAGNOSIS — I10 Essential (primary) hypertension: Secondary | ICD-10-CM

## 2015-01-22 DIAGNOSIS — E7800 Pure hypercholesterolemia, unspecified: Secondary | ICD-10-CM

## 2015-01-26 ENCOUNTER — Other Ambulatory Visit (INDEPENDENT_AMBULATORY_CARE_PROVIDER_SITE_OTHER): Payer: Commercial Managed Care - HMO

## 2015-01-26 DIAGNOSIS — E7800 Pure hypercholesterolemia, unspecified: Secondary | ICD-10-CM | POA: Diagnosis not present

## 2015-01-26 DIAGNOSIS — E1165 Type 2 diabetes mellitus with hyperglycemia: Secondary | ICD-10-CM | POA: Diagnosis not present

## 2015-01-26 DIAGNOSIS — I10 Essential (primary) hypertension: Secondary | ICD-10-CM | POA: Diagnosis not present

## 2015-01-26 DIAGNOSIS — E118 Type 2 diabetes mellitus with unspecified complications: Secondary | ICD-10-CM

## 2015-01-26 DIAGNOSIS — N402 Nodular prostate without lower urinary tract symptoms: Secondary | ICD-10-CM

## 2015-01-26 LAB — BASIC METABOLIC PANEL
BUN: 17 mg/dL (ref 6–23)
CALCIUM: 9.4 mg/dL (ref 8.4–10.5)
CO2: 28 meq/L (ref 19–32)
CREATININE: 1.02 mg/dL (ref 0.40–1.50)
Chloride: 101 mEq/L (ref 96–112)
GFR: 94.28 mL/min (ref >60.00)
GLUCOSE: 166 mg/dL — AB (ref 70–99)
Potassium: 4.9 mEq/L (ref 3.5–5.1)
Sodium: 137 mEq/L (ref 135–145)

## 2015-01-26 LAB — HEMOGLOBIN A1C: Hgb A1c MFr Bld: 7.5 % — ABNORMAL HIGH (ref 4.6–6.5)

## 2015-01-26 LAB — LIPID PANEL
CHOL/HDL RATIO: 4
CHOLESTEROL: 132 mg/dL (ref 0–200)
HDL: 31.2 mg/dL — ABNORMAL LOW (ref >39.00)
LDL Cholesterol: 77 mg/dL (ref 0–99)
NonHDL: 100.86
TRIGLYCERIDES: 119 mg/dL (ref 0.0–149.0)
VLDL: 23.8 mg/dL (ref 0.0–40.0)

## 2015-01-26 LAB — PSA: PSA: 1.89 ng/mL (ref 0.10–4.00)

## 2015-01-31 ENCOUNTER — Encounter: Payer: Self-pay | Admitting: Family Medicine

## 2015-01-31 ENCOUNTER — Ambulatory Visit (INDEPENDENT_AMBULATORY_CARE_PROVIDER_SITE_OTHER): Payer: Commercial Managed Care - HMO | Admitting: Family Medicine

## 2015-01-31 VITALS — BP 124/76 | HR 66 | Temp 98.0°F | Ht 67.5 in | Wt 245.0 lb

## 2015-01-31 DIAGNOSIS — M542 Cervicalgia: Secondary | ICD-10-CM

## 2015-01-31 DIAGNOSIS — E1142 Type 2 diabetes mellitus with diabetic polyneuropathy: Secondary | ICD-10-CM

## 2015-01-31 DIAGNOSIS — G8929 Other chronic pain: Secondary | ICD-10-CM

## 2015-01-31 DIAGNOSIS — Z0000 Encounter for general adult medical examination without abnormal findings: Secondary | ICD-10-CM | POA: Insufficient documentation

## 2015-01-31 DIAGNOSIS — Z23 Encounter for immunization: Secondary | ICD-10-CM

## 2015-01-31 DIAGNOSIS — I10 Essential (primary) hypertension: Secondary | ICD-10-CM

## 2015-01-31 DIAGNOSIS — E118 Type 2 diabetes mellitus with unspecified complications: Secondary | ICD-10-CM

## 2015-01-31 DIAGNOSIS — Z7189 Other specified counseling: Secondary | ICD-10-CM

## 2015-01-31 DIAGNOSIS — G4733 Obstructive sleep apnea (adult) (pediatric): Secondary | ICD-10-CM

## 2015-01-31 DIAGNOSIS — E1165 Type 2 diabetes mellitus with hyperglycemia: Secondary | ICD-10-CM

## 2015-01-31 DIAGNOSIS — IMO0001 Reserved for inherently not codable concepts without codable children: Secondary | ICD-10-CM

## 2015-01-31 DIAGNOSIS — E7800 Pure hypercholesterolemia, unspecified: Secondary | ICD-10-CM

## 2015-01-31 NOTE — Assessment & Plan Note (Signed)
Reviewed increased A1c value, recommended avoid added sugars.

## 2015-01-31 NOTE — Progress Notes (Signed)
Pre visit review using our clinic review tool, if applicable. No additional management support is needed unless otherwise documented below in the visit note. 

## 2015-01-31 NOTE — Assessment & Plan Note (Signed)
Not using CPAP.  

## 2015-01-31 NOTE — Assessment & Plan Note (Signed)
Discussed healthy diet and lifestyle changes to affect sustainable weight loss  

## 2015-01-31 NOTE — Assessment & Plan Note (Signed)
Chronic, stable. Continue ARB

## 2015-01-31 NOTE — Assessment & Plan Note (Signed)
Chronic, continue lipitor. Reviewed FLP.

## 2015-01-31 NOTE — Assessment & Plan Note (Addendum)
Advanced directive - has daughter as HCPOA. Has not discussed this with daughter. Does not have advanced directive. Packet provided today.

## 2015-01-31 NOTE — Assessment & Plan Note (Signed)

## 2015-01-31 NOTE — Assessment & Plan Note (Signed)
Sees Dr [name] - on baclofen and oxycodone.

## 2015-01-31 NOTE — Assessment & Plan Note (Signed)
Preventative protocols reviewed and updated unless pt declined. Discussed healthy diet and lifestyle.  

## 2015-01-31 NOTE — Assessment & Plan Note (Signed)
Continue gabapentin.

## 2015-01-31 NOTE — Patient Instructions (Addendum)
Flu shot today. Advanced directive packet provided today.  Watch added sugars in diet. Return as needed or in 6 months for follow up visit.  Health Maintenance, Male A healthy lifestyle and preventative care can promote health and wellness.  Maintain regular health, dental, and eye exams.  Eat a healthy diet. Foods like vegetables, fruits, whole grains, low-fat dairy products, and lean protein foods contain the nutrients you need and are low in calories. Decrease your intake of foods high in solid fats, added sugars, and salt. Get information about a proper diet from your health care provider, if necessary.  Regular physical exercise is one of the most important things you can do for your health. Most adults should get at least 150 minutes of moderate-intensity exercise (any activity that increases your heart rate and causes you to sweat) each week. In addition, most adults need muscle-strengthening exercises on 2 or more days a week.   Maintain a healthy weight. The body mass index (BMI) is a screening tool to identify possible weight problems. It provides an estimate of body fat based on height and weight. Your health care provider can find your BMI and can help you achieve or maintain a healthy weight. For males 20 years and older:  A BMI below 18.5 is considered underweight.  A BMI of 18.5 to 24.9 is normal.  A BMI of 25 to 29.9 is considered overweight.  A BMI of 30 and above is considered obese.  Maintain normal blood lipids and cholesterol by exercising and minimizing your intake of saturated fat. Eat a balanced diet with plenty of fruits and vegetables. Blood tests for lipids and cholesterol should begin at age 5 and be repeated every 5 years. If your lipid or cholesterol levels are high, you are over age 57, or you are at high risk for heart disease, you may need your cholesterol levels checked more frequently.Ongoing high lipid and cholesterol levels should be treated with  medicines if diet and exercise are not working.  If you smoke, find out from your health care provider how to quit. If you do not use tobacco, do not start.  Lung cancer screening is recommended for adults aged 81-80 years who are at high risk for developing lung cancer because of a history of smoking. A yearly low-dose CT scan of the lungs is recommended for people who have at least a 30-pack-year history of smoking and are current smokers or have quit within the past 15 years. A pack year of smoking is smoking an average of 1 pack of cigarettes a day for 1 year (for example, a 30-pack-year history of smoking could mean smoking 1 pack a day for 30 years or 2 packs a day for 15 years). Yearly screening should continue until the smoker has stopped smoking for at least 15 years. Yearly screening should be stopped for people who develop a health problem that would prevent them from having lung cancer treatment.  If you choose to drink alcohol, do not have more than 2 drinks per day. One drink is considered to be 12 oz (360 mL) of beer, 5 oz (150 mL) of wine, or 1.5 oz (45 mL) of liquor.  Avoid the use of street drugs. Do not share needles with anyone. Ask for help if you need support or instructions about stopping the use of drugs.  High blood pressure causes heart disease and increases the risk of stroke. High blood pressure is more likely to develop in:  People who have  blood pressure in the end of the normal range (100-139/85-89 mm Hg).  People who are overweight or obese.  People who are African American.  If you are 66-70 years of age, have your blood pressure checked every 3-5 years. If you are 87 years of age or older, have your blood pressure checked every year. You should have your blood pressure measured twice--once when you are at a hospital or clinic, and once when you are not at a hospital or clinic. Record the average of the two measurements. To check your blood pressure when you are not  at a hospital or clinic, you can use:  An automated blood pressure machine at a pharmacy.  A home blood pressure monitor.  If you are 60-46 years old, ask your health care provider if you should take aspirin to prevent heart disease.  Diabetes screening involves taking a blood sample to check your fasting blood sugar level. This should be done once every 3 years after age 62 if you are at a normal weight and without risk factors for diabetes. Testing should be considered at a younger age or be carried out more frequently if you are overweight and have at least 1 risk factor for diabetes.  Colorectal cancer can be detected and often prevented. Most routine colorectal cancer screening begins at the age of 66 and continues through age 47. However, your health care provider may recommend screening at an earlier age if you have risk factors for colon cancer. On a yearly basis, your health care provider may provide home test kits to check for hidden blood in the stool. A small camera at the end of a tube may be used to directly examine the colon (sigmoidoscopy or colonoscopy) to detect the earliest forms of colorectal cancer. Talk to your health care provider about this at age 41 when routine screening begins. A direct exam of the colon should be repeated every 5-10 years through age 55, unless early forms of precancerous polyps or small growths are found.  People who are at an increased risk for hepatitis B should be screened for this virus. You are considered at high risk for hepatitis B if:  You were born in a country where hepatitis B occurs often. Talk with your health care provider about which countries are considered high risk.  Your parents were born in a high-risk country and you have not received a shot to protect against hepatitis B (hepatitis B vaccine).  You have HIV or AIDS.  You use needles to inject street drugs.  You live with, or have sex with, someone who has hepatitis B.  You  are a man who has sex with other men (MSM).  You get hemodialysis treatment.  You take certain medicines for conditions like cancer, organ transplantation, and autoimmune conditions.  Hepatitis C blood testing is recommended for all people born from [birth year] through 1966 and any individual with known risk factors for hepatitis C.  Healthy men should no longer receive prostate-specific antigen (PSA) blood tests as part of routine cancer screening. Talk to your health care provider about prostate cancer screening.  Testicular cancer screening is not recommended for adolescents or adult males who have no symptoms. Screening includes self-exam, a health care provider exam, and other screening tests. Consult with your health care provider about any symptoms you have or any concerns you have about testicular cancer.  Practice safe sex. Use condoms and avoid high-risk sexual practices to reduce the spread of sexually transmitted infections (  STIs).  You should be screened for STIs, including gonorrhea and chlamydia if:  You are sexually active and are younger than 24 years.  You are older than 24 years, and your health care provider tells you that you are at risk for this type of infection.  Your sexual activity has changed since you were last screened, and you are at an increased risk for chlamydia or gonorrhea. Ask your health care provider if you are at risk.  If you are at risk of being infected with HIV, it is recommended that you take a prescription medicine daily to prevent HIV infection. This is called pre-exposure prophylaxis (PrEP). You are considered at risk if:  You are a man who has sex with other men (MSM).  You are a heterosexual man who is sexually active with multiple partners.  You take drugs by injection.  You are sexually active with a partner who has HIV.  Talk with your health care provider about whether you are at high risk of being infected with HIV. If you choose to begin  PrEP, you should first be tested for HIV. You should then be tested every 3 months for as long as you are taking PrEP.  Use sunscreen. Apply sunscreen liberally and repeatedly throughout the day. You should seek shade when your shadow is shorter than you. Protect yourself by wearing long sleeves, pants, a wide-brimmed hat, and sunglasses year round whenever you are outdoors.  Tell your health care provider of new moles or changes in moles, especially if there is a change in shape or color. Also, tell your health care provider if a mole is larger than the size of a pencil eraser.  A one-time screening for abdominal aortic aneurysm (AAA) and surgical repair of large AAAs by ultrasound is recommended for men aged 52-75 years who are current or former smokers.  Stay current with your vaccines (immunizations).   This information is not intended to replace advice given to you by your health care provider. Make sure you discuss any questions you have with your health care provider.   Document Released: 10/06/2007 Document Revised: 04/30/2014 Document Reviewed: 09/04/2010 Elsevier Interactive Patient Education [employer].

## 2015-01-31 NOTE — Progress Notes (Signed)
BP 124/76 mmHg  Pulse 66  Temp(Src) 98 F (36.7 C) (Oral)  Ht 5' 7.5" (1.715 m)  Wt 245 lb (111.131 kg)  BMI 37.78 kg/m2  SpO2 97%   CC: medicare wellness visit Subjective:    Patient ID: ASHAUN [name], male    DOB: 1951/01/20, 64 y.o.   MRN: [id number]  HPI: [name] is a 64 y.o. male presenting on 01/31/2015 for Annual Exam   Passes hearing and vision screens.  Denies falls in last year.  Denies depression/anhedonia, sadness  Preventative: Colon cancer screening - colonoscopy 09/2011 - severe diverticulosis, int hemorrhoids, rec rpt 5 yrs Carlean Purl) Prostate cancer screening - h/o nodule, saw Dr. Karsten Ro - released from his care. H/o elevated PSA in the past.  Flu - today  Pneumovax 05/2012  Tdap 01/2014 zostavax 11/2010  Advanced directive - has daughter as HCPOA. Has not discussed this with daughter. Does not have advanced directive. Packet provided today.  Seat belt use discussed.   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 Retired  Edu - HS but didn't do well  Activity: walking 2x/wk  Diet: good water, fruits/vegetables daily   Relevant past medical, surgical, family and social history reviewed and updated as indicated. Interim medical history since our last visit reviewed. Allergies and medications reviewed and updated. Current Outpatient Prescriptions on File Prior to Visit  Medication Sig  . aspirin EC 81 MG tablet Take 81 mg by mouth daily.  Marland Kitchen atorvastatin (LIPITOR) 10 MG tablet Take 1 tablet (10 mg total) by mouth daily.  . baclofen (LIORESAL) 10 MG tablet Take 1 tablet (10 mg total) by mouth at bedtime as needed (for back spasms).  . gabapentin (NEURONTIN) 300 MG capsule Take 1 capsule (300 mg total) by mouth 2 (two) times daily.  Marland Kitchen glipiZIDE (GLUCOTROL XL) 2.5 MG 24 hr tablet Take 1 tablet  (2.5 mg total) by mouth daily with breakfast.  . glucose blood (ONE TOUCH ULTRA TEST) test strip USE  TO  TEST  BLOOD  SUGAR TWICE DAILY AS DIRECTED Dx E11.8  . losartan (COZAAR) 25 MG tablet Take 1 tablet (25 mg total) by mouth daily.  . metFORMIN (GLUCOPHAGE) 850 MG tablet Take 1 tablet (850 mg total) by mouth 2 (two) times daily with a meal.  . omeprazole (PRILOSEC) 40 MG capsule Take 1 capsule (40 mg total) by mouth daily.  Marland Kitchen oxyCODONE-acetaminophen (PERCOCET) 10-325 MG per tablet Take 1-2 tablets by mouth every 4 (four) hours as needed for pain.  . cetirizine (ZYRTEC) 10 MG tablet Take 1 tablet (10 mg total) by mouth daily. (Patient not taking: Reported on 01/31/2015)  . fluticasone (FLONASE) 50 MCG/ACT nasal spray Place 2 sprays into both nostrils daily. (Patient not taking: Reported on 01/31/2015)  . tamsulosin (FLOMAX) 0.4 MG CAPS capsule Take 1 capsule (0.4 mg total) by mouth daily after supper. (Patient not taking: Reported on 01/31/2015)   No current facility-administered medications on file prior to visit.    Review of Systems  Constitutional: Negative for fever, chills, activity change, appetite change, fatigue and unexpected weight change.  HENT: Negative for hearing loss.   Eyes: Negative for visual disturbance.  Respiratory: Negative for cough, chest tightness, shortness of breath and wheezing.   Cardiovascular: Negative for chest pain, palpitations and leg swelling.  Gastrointestinal: Negative for nausea, vomiting, abdominal pain, diarrhea,  constipation, blood in stool and abdominal distention.  Genitourinary: Negative for hematuria and difficulty urinating.  Musculoskeletal: Negative for myalgias, arthralgias and neck pain.  Skin: Negative for rash.  Neurological: Positive for light-headedness. Negative for dizziness, seizures, syncope and headaches.  Hematological: Negative for adenopathy. Does not bruise/bleed easily.  Psychiatric/Behavioral: Negative for dysphoric mood.  The patient is not nervous/anxious.    Per HPI unless specifically indicated above     Objective:    BP 124/76 mmHg  Pulse 66  Temp(Src) 98 F (36.7 C) (Oral)  Ht 5' 7.5" (1.715 m)  Wt 245 lb (111.131 kg)  BMI 37.78 kg/m2  SpO2 97%  Wt Readings from Last 3 Encounters:  01/31/15 245 lb (111.131 kg)  10/01/14 240 lb 4 oz (108.977 kg)  05/31/14 241 lb 12 oz (109.657 kg)    Physical Exam  Constitutional: He is oriented to person, place, and time. He appears well-developed and well-nourished. No distress.  HENT:  Head: Normocephalic and atraumatic.  Right Ear: Hearing, tympanic membrane, external ear and ear canal normal.  Left Ear: Hearing, tympanic membrane, external ear and ear canal normal.  Nose: Nose normal.  Mouth/Throat: Uvula is midline, oropharynx is clear and moist and mucous membranes are normal. No oropharyngeal exudate, posterior oropharyngeal edema or posterior oropharyngeal erythema.  Eyes: Conjunctivae and EOM are normal. Pupils are equal, round, and reactive to light. No scleral icterus.  Neck: Normal range of motion. Neck supple. Carotid bruit is not present. No thyromegaly present.  Cardiovascular: Normal rate, regular rhythm, normal heart sounds and intact distal pulses.   No murmur heard. Pulses:      Radial pulses are 2+ on the right side, and 2+ on the left side.  Pulmonary/Chest: Effort normal and breath sounds normal. No respiratory distress. He has no wheezes. He has no rales.  Abdominal: Soft. Bowel sounds are normal. He exhibits no distension and no mass. There is no tenderness. There is no rebound and no guarding.  Genitourinary: Rectum normal. Rectal exam shows no external hemorrhoid, no internal hemorrhoid, no fissure, no mass, no tenderness and anal tone normal. Prostate is not enlarged (20gm) and not tender.  Nodule at posterior edge of prostate  Musculoskeletal: Normal range of motion. He exhibits no edema.  Lymphadenopathy:    He has no cervical  adenopathy.  Neurological: He is alert and oriented to person, place, and time.  CN grossly intact, station and gait intact Recall 2/3, 2/3 with cue Calculation marked difficulty with math and spelling  Skin: Skin is warm and dry. No rash noted.  Psychiatric: He has a normal mood and affect. His behavior is normal. Judgment and thought content normal.  Nursing note and vitals reviewed.  Results for orders placed or performed in visit on 01/26/15  Lipid panel  Result Value Ref Range   Cholesterol 132 0 - 200 mg/dL   Triglycerides 119.0 0.0 - 149.0 mg/dL   HDL 31.20 (L) >39.00 mg/dL   VLDL 23.8 0.0 - 40.0 mg/dL   LDL Cholesterol 77 0 - 99 mg/dL   Total CHOL/HDL Ratio 4    NonHDL 100.86   Hemoglobin A1c  Result Value Ref Range   Hgb A1c MFr Bld 7.5 (H) 4.6 - 6.5 %  Basic metabolic panel  Result Value Ref Range   Sodium 137 135 - 145 mEq/L   Potassium 4.9 3.5 - 5.1 mEq/L   Chloride 101 96 - 112 mEq/L   CO2 28 19 - 32 mEq/L   Glucose, Bld 166 (  H) 70 - 99 mg/dL   BUN 17 6 - 23 mg/dL   Creatinine, Ser 1.02 0.40 - 1.50 mg/dL   Calcium 9.4 8.4 - 10.5 mg/dL   GFR 94.28 >60.00 mL/min  PSA  Result Value Ref Range   PSA 1.89 0.10 - 4.00 ng/mL      Assessment & Plan:  Hep C screen next visit. Problem List Items Addressed This Visit    OSA (obstructive sleep apnea)    Not using CPAP.      Obesity, Class II, BMI 35-39.9, with comorbidity (HCC)    Discussed healthy diet and lifestyle changes to affect sustainable weight loss.      Neck pain, chronic    Sees Dr [name] - on baclofen and oxycodone.       Medicare annual wellness visit, subsequent - Primary    I have personally reviewed the Medicare Annual Wellness questionnaire and have noted 1. The patient's medical and social history 2. Their use of alcohol, tobacco or illicit drugs 3. Their current medications and supplements 4. The patient's functional ability including ADL's, fall risks, home safety risks and hearing or  visual impairment. Cognitive function has been assessed and addressed as indicated.  5. Diet and physical activity 6. Evidence for depression or mood disorders The patients weight, height, BMI have been recorded in the chart. I have made referrals, counseling and provided education to the patient based on review of the above and I have provided the pt with a written personalized care plan for preventive services. Provider list updated.. See scanned questionairre as needed for further documentation. Reviewed preventative protocols and updated unless pt declined.       HYPERCHOLESTEROLEMIA, PURE    Chronic, continue lipitor. Reviewed FLP.       Health maintenance examination    Preventative protocols reviewed and updated unless pt declined. Discussed healthy diet and lifestyle.       Essential hypertension    Chronic, stable. Continue ARB      Diabetic neuropathy (HCC)    Continue gabapentin.      Diabetes mellitus type 2, uncontrolled, with complications ([place])    Reviewed increased A1c value, recommended avoid added sugars.       Advanced care planning/counseling discussion    Advanced directive - has daughter as HCPOA. Has not discussed this with daughter. Does not have advanced directive. Packet provided today.       Other Visit Diagnoses    Need for immunization against influenza        Relevant Orders    Flu Vaccine QUAD 36+ mos PF IM (Fluarix & Fluzone Quad PF) (Completed)        Follow up plan: Return in about 6 months (around 08/01/2015), or as needed, for follow up visit.

## 2015-04-28 ENCOUNTER — Telehealth: Payer: Self-pay

## 2015-04-28 NOTE — Telephone Encounter (Signed)
Pt left v/m; pt request cb about med refill. Left v/m requesting pt to cb.

## 2015-04-28 NOTE — Telephone Encounter (Signed)
Pt called back and has changed ins and wants baclofen and gabapentin sent to new mail order pharmacy. Pt does not know name of pharmacy. Pt will cb with info.

## 2015-04-29 ENCOUNTER — Other Ambulatory Visit: Payer: Self-pay | Admitting: *Deleted

## 2015-04-29 DIAGNOSIS — E7800 Pure hypercholesterolemia, unspecified: Secondary | ICD-10-CM

## 2015-04-29 MED ORDER — ATORVASTATIN CALCIUM 10 MG PO TABS: 10.0000 mg | ORAL_TABLET | Freq: Every day | ORAL | Status: DC

## 2015-04-29 MED ORDER — GABAPENTIN 300 MG PO CAPS: 300.0000 mg | ORAL_CAPSULE | Freq: Two times a day (BID) | ORAL | Status: DC

## 2015-04-29 MED ORDER — LOSARTAN POTASSIUM 25 MG PO TABS: 25.0000 mg | ORAL_TABLET | Freq: Every day | ORAL | Status: DC

## 2015-04-29 MED ORDER — BACLOFEN 10 MG PO TABS: 10.0000 mg | ORAL_TABLET | Freq: Every evening | ORAL | Status: DC | PRN

## 2015-04-29 MED ORDER — OMEPRAZOLE 40 MG PO CPDR: 40.0000 mg | DELAYED_RELEASE_CAPSULE | Freq: Every day | ORAL | Status: DC

## 2015-04-29 MED ORDER — METFORMIN HCL 850 MG PO TABS: 850.0000 mg | ORAL_TABLET | Freq: Two times a day (BID) | ORAL | Status: DC

## 2015-04-29 NOTE — Telephone Encounter (Signed)
Ok to refill to mail order? 

## 2015-05-02 DIAGNOSIS — G894 Chronic pain syndrome: Secondary | ICD-10-CM | POA: Diagnosis not present

## 2015-05-02 DIAGNOSIS — M961 Postlaminectomy syndrome, not elsewhere classified: Secondary | ICD-10-CM | POA: Diagnosis not present

## 2015-05-02 DIAGNOSIS — Z79891 Long term (current) use of opiate analgesic: Secondary | ICD-10-CM | POA: Diagnosis not present

## 2015-06-16 ENCOUNTER — Other Ambulatory Visit: Payer: Self-pay

## 2015-06-16 NOTE — Telephone Encounter (Signed)
Pt left v/m requesting refill test strips; pt did not leave name of pharmacy. Left v/m requesting cb.

## 2015-06-20 MED ORDER — GLUCOSE BLOOD VI STRP: ORAL_STRIP | Status: DC

## 2015-06-20 NOTE — Telephone Encounter (Signed)
Spoke with pt and he will cb with pharmacy.

## 2015-06-20 NOTE — Telephone Encounter (Signed)
Pt last annual exam 01/2015. Refill done per protocol.

## 2015-06-20 NOTE — Telephone Encounter (Signed)
Patient called back and said the pharmacy is [facility]. The phamarcy's telephone number is [phone].

## 2015-06-22 NOTE — Telephone Encounter (Signed)
Refill done in separate note on 04/29/15.

## 2015-08-01 ENCOUNTER — Ambulatory Visit (INDEPENDENT_AMBULATORY_CARE_PROVIDER_SITE_OTHER): Payer: Medicare Other | Admitting: Family Medicine

## 2015-08-01 ENCOUNTER — Other Ambulatory Visit: Payer: Self-pay | Admitting: Family Medicine

## 2015-08-01 ENCOUNTER — Encounter: Payer: Self-pay | Admitting: Family Medicine

## 2015-08-01 VITALS — BP 128/78 | HR 80 | Temp 98.6°F | Ht 68.0 in | Wt 235.8 lb

## 2015-08-01 DIAGNOSIS — N529 Male erectile dysfunction, unspecified: Secondary | ICD-10-CM

## 2015-08-01 DIAGNOSIS — E118 Type 2 diabetes mellitus with unspecified complications: Secondary | ICD-10-CM

## 2015-08-01 DIAGNOSIS — Z1159 Encounter for screening for other viral diseases: Secondary | ICD-10-CM | POA: Diagnosis not present

## 2015-08-01 DIAGNOSIS — E1165 Type 2 diabetes mellitus with hyperglycemia: Secondary | ICD-10-CM | POA: Diagnosis not present

## 2015-08-01 DIAGNOSIS — E7800 Pure hypercholesterolemia, unspecified: Secondary | ICD-10-CM

## 2015-08-01 DIAGNOSIS — IMO0001 Reserved for inherently not codable concepts without codable children: Secondary | ICD-10-CM

## 2015-08-01 DIAGNOSIS — I10 Essential (primary) hypertension: Secondary | ICD-10-CM | POA: Diagnosis not present

## 2015-08-01 DIAGNOSIS — Z23 Encounter for immunization: Secondary | ICD-10-CM

## 2015-08-01 HISTORY — DX: Male erectile dysfunction, unspecified: N52.9

## 2015-08-01 LAB — BASIC METABOLIC PANEL
BUN: 15 mg/dL (ref 6–23)
CO2: 27 mEq/L (ref 19–32)
Calcium: 10 mg/dL (ref 8.4–10.5)
Chloride: 102 mEq/L (ref 96–112)
Creatinine, Ser: 0.89 mg/dL (ref 0.40–1.50)
GFR: 110.17 mL/min (ref >60.00)
Glucose, Bld: 149 mg/dL — ABNORMAL HIGH (ref 70–99)
Potassium: 4.1 mEq/L (ref 3.5–5.1)
Sodium: 139 mEq/L (ref 135–145)

## 2015-08-01 LAB — HEPATITIS C ANTIBODY: HCV Ab: NEGATIVE

## 2015-08-01 LAB — HEMOGLOBIN A1C: HEMOGLOBIN A1C: 8.1 % — AB (ref 4.6–6.5)

## 2015-08-01 MED ORDER — SILDENAFIL CITRATE 100 MG PO TABS: 50.0000 mg | ORAL_TABLET | Freq: Every day | ORAL | Status: DC | PRN

## 2015-08-01 NOTE — Assessment & Plan Note (Addendum)
Endorses good control recently - check labs today. Continue metformin with glipizide XL.

## 2015-08-01 NOTE — Progress Notes (Signed)
BP 128/78 mmHg  Pulse 80  Temp(Src) 98.6 F (37 C) (Oral)  Ht 5\' 8"  (1.727 m)  Wt 235 lb 12 oz (106.935 kg)  BMI 35.85 kg/m2   CC: 40mo diabetes f/u  Subjective:    Patient ID: [name], male    DOB: 04-19-1951, 65 y.o.   MRN: [id number]  HPI: [name] is a 65 y.o. male presenting on 08/01/2015 for Follow-up   Intermittent L arm ache/numbness.  Interested in [facility].   DM - regularly does check sugars overall doing well. isolated high to 250s, usually well controlled. Compliant with antihyperglycemic regimen which includes: glipizide XL 2.5mg  daily and metformin 850mg  bid.Denies low sugars or hypoglycemic symptoms. Some foot paresthesias. Last diabetic eye exam 09/2014. Pneumovax: 05/2012. Prevnar: today  Lab Results  Component Value Date   HGBA1C 7.5* 01/26/2015    Diabetic Foot Exam - Simple   Simple Foot Form  Diabetic Foot exam was performed with the following findings:  Yes 08/01/2015  8:45 AM  Visual Inspection  No deformities, no ulcerations, no other skin breakdown bilaterally:  Yes  Sensation Testing  Intact to touch and monofilament testing bilaterally:  Yes  Pulse Check  Posterior Tibialis and Dorsalis pulse intact bilaterally:  Yes  Comments  Slight maceration L interdigital region       HLD - compliant with lipitor 10mg  daily without myalgias  HTN - compliant with losartan 25mg  daily. No HA, vision changes, CP/tightness, SOB, leg swelling.   Relevant past medical, surgical, family and social history reviewed and updated as indicated. Interim medical history since our last visit reviewed. Allergies and medications reviewed and updated. Current Outpatient Prescriptions on File Prior to Visit  Medication Sig  . aspirin EC 81 MG tablet Take 81 mg by mouth daily.  Marland Kitchen atorvastatin (LIPITOR) 10 MG tablet Take 1 tablet (10 mg total) by mouth daily.  . baclofen (LIORESAL) 10 MG tablet Take 1 tablet (10 mg total) by mouth at  bedtime as needed (for back spasms).  . gabapentin (NEURONTIN) 300 MG capsule Take 1 capsule (300 mg total) by mouth 2 (two) times daily.  Marland Kitchen glipiZIDE (GLUCOTROL XL) 2.5 MG 24 hr tablet Take 1 tablet (2.5 mg total) by mouth daily with breakfast.  . glucose blood (ONE TOUCH ULTRA TEST) test strip USE  TO  TEST  BLOOD  SUGAR TWICE DAILY AS DIRECTED Dx E11.8  . losartan (COZAAR) 25 MG tablet Take 1 tablet (25 mg total) by mouth daily.  . metFORMIN (GLUCOPHAGE) 850 MG tablet Take 1 tablet (850 mg total) by mouth 2 (two) times daily with a meal.  . omeprazole (PRILOSEC) 40 MG capsule Take 1 capsule (40 mg total) by mouth daily.  Marland Kitchen oxyCODONE-acetaminophen (PERCOCET) 10-325 MG per tablet Take 1-2 tablets by mouth every 4 (four) hours as needed for pain.  . tamsulosin (FLOMAX) 0.4 MG CAPS capsule Take 1 capsule (0.4 mg total) by mouth daily after supper.  . cetirizine (ZYRTEC) 10 MG tablet Take 1 tablet (10 mg total) by mouth daily. (Patient not taking: Reported on 08/01/2015)  . fluticasone (FLONASE) 50 MCG/ACT nasal spray Place 2 sprays into both nostrils daily. (Patient not taking: Reported on 01/31/2015)   No current facility-administered medications on file prior to visit.    Review of Systems Per HPI unless specifically indicated in ROS section     Objective:    BP 128/78 mmHg  Pulse 80  Temp(Src) 98.6 F (37 C) (Oral)  Ht  5\' 8"  (1.727 m)  Wt 235 lb 12 oz (106.935 kg)  BMI 35.85 kg/m2  Wt Readings from Last 3 Encounters:  08/01/15 235 lb 12 oz (106.935 kg)  01/31/15 245 lb (111.131 kg)  10/01/14 240 lb 4 oz (108.977 kg)    Physical Exam  Constitutional: He appears well-developed and well-nourished. No distress.  HENT:  Head: Normocephalic and atraumatic.  Right Ear: External ear normal.  Left Ear: External ear normal.  Nose: Nose normal.  Mouth/Throat: Oropharynx is clear and moist. No oropharyngeal exudate.  Eyes: Conjunctivae and EOM are normal. Pupils are equal, round, and  reactive to light. No scleral icterus.  Neck: Normal range of motion. Neck supple.  Cardiovascular: Normal rate, regular rhythm, normal heart sounds and intact distal pulses.   No murmur heard. Pulmonary/Chest: Effort normal and breath sounds normal. No respiratory distress. He has no wheezes. He has no rales.  Musculoskeletal: He exhibits no edema.  See HPI for foot exam if done  Lymphadenopathy:    He has no cervical adenopathy.  Skin: Skin is warm and dry. No rash noted.  Psychiatric: He has a normal mood and affect.  Nursing note and vitals reviewed.  Results for orders placed or performed in visit on 01/26/15  Lipid panel  Result Value Ref Range   Cholesterol 132 0 - 200 mg/dL   Triglycerides 119.0 0.0 - 149.0 mg/dL   HDL 31.20 (L) >39.00 mg/dL   VLDL 23.8 0.0 - 40.0 mg/dL   LDL Cholesterol 77 0 - 99 mg/dL   Total CHOL/HDL Ratio 4    NonHDL 100.86   Hemoglobin A1c  Result Value Ref Range   Hgb A1c MFr Bld 7.5 (H) 4.6 - 6.5 %  Basic metabolic panel  Result Value Ref Range   Sodium 137 135 - 145 mEq/L   Potassium 4.9 3.5 - 5.1 mEq/L   Chloride 101 96 - 112 mEq/L   CO2 28 19 - 32 mEq/L   Glucose, Bld 166 (H) 70 - 99 mg/dL   BUN 17 6 - 23 mg/dL   Creatinine, Ser 1.02 0.40 - 1.50 mg/dL   Calcium 9.4 8.4 - 10.5 mg/dL   GFR 94.28 >60.00 mL/min  PSA  Result Value Ref Range   PSA 1.89 0.10 - 4.00 ng/mL      Assessment & Plan:   Problem List Items Addressed This Visit    Diabetes mellitus type 2, uncontrolled, with complications ([place]) - Primary    Endorses good control recently - check labs today. Continue metformin with glipizide XL.       Relevant Orders   Basic metabolic panel   Hemoglobin A1c   HYPERCHOLESTEROLEMIA, PURE    Chronic, stable. Continue lipitor 10mg  daily      Relevant Medications   sildenafil (VIAGRA) 100 MG tablet   Essential hypertension    Chronic, stable. Continue losartan.      Relevant Medications   sildenafil (VIAGRA) 100 MG tablet    Obesity, Class II, BMI 35-39.9, with comorbidity ([place])    Discussed healthy diet and lifestyle changes to affect sustainable weight loss.      Erectile dysfunction    Requests trial viagra. Has never used PDE5 inhibitor. Discussed possible side effects including headaches. Discussed priapism, discussed need to avoid all nitrates. Coupon provided.       Other Visit Diagnoses    Need for hepatitis C screening test        Relevant Orders    Hepatitis C antibody, reflex  Follow up plan: Return in about 6 months (around 01/31/2016), or as needed, for medicare wellness visit.  Ria Bush, MD

## 2015-08-01 NOTE — Assessment & Plan Note (Signed)
Discussed healthy diet and lifestyle changes to affect sustainable weight loss  

## 2015-08-01 NOTE — Assessment & Plan Note (Signed)
Chronic, stable. Continue losartan.  

## 2015-08-01 NOTE — Assessment & Plan Note (Addendum)
Requests trial viagra. Has never used PDE5 inhibitor. Discussed possible side effects including headaches. Discussed priapism, discussed need to avoid all nitrates. Coupon provided.

## 2015-08-01 NOTE — Assessment & Plan Note (Signed)
Chronic, stable. Continue lipitor 10mg daily. 

## 2015-08-01 NOTE — Patient Instructions (Addendum)
prevnar today. labwork today. Trial viagra sent to your pharmacy. Coupon provided today as well.  Return in 6 months for medicare wellness visit

## 2015-08-01 NOTE — Progress Notes (Signed)
Pre visit review using our clinic review tool, if applicable. No additional management support is needed unless otherwise documented below in the visit note. 

## 2015-08-01 NOTE — Addendum Note (Signed)
Addended by: Emelia Salisbury C on: 08/01/2015 09:28 AM   Modules accepted: Orders

## 2015-08-03 ENCOUNTER — Other Ambulatory Visit: Payer: Self-pay | Admitting: Family Medicine

## 2015-08-03 MED ORDER — GLIPIZIDE ER 5 MG PO TB24: 5.0000 mg | ORAL_TABLET | Freq: Every day | ORAL | Status: DC

## 2015-08-26 ENCOUNTER — Telehealth: Payer: Self-pay | Admitting: Family Medicine

## 2015-08-26 NOTE — Telephone Encounter (Signed)
Pt just picked up the new medication (glipizide) from the pharmacy and wants to know if you still want him to take it or should he take the metformin 850 mg? cb number is [phone] Pt request cb

## 2015-08-26 NOTE — Telephone Encounter (Signed)
Continue metformin 850mg  bid Start higher glipizide XL 5mg  dose.

## 2015-08-29 NOTE — Telephone Encounter (Signed)
Patient notified and verbalized understanding. 

## 2015-09-13 DIAGNOSIS — G894 Chronic pain syndrome: Secondary | ICD-10-CM | POA: Diagnosis not present

## 2015-09-13 DIAGNOSIS — Z79891 Long term (current) use of opiate analgesic: Secondary | ICD-10-CM | POA: Diagnosis not present

## 2015-09-13 DIAGNOSIS — M961 Postlaminectomy syndrome, not elsewhere classified: Secondary | ICD-10-CM | POA: Diagnosis not present

## 2015-10-31 ENCOUNTER — Other Ambulatory Visit: Payer: Self-pay

## 2015-10-31 NOTE — Telephone Encounter (Signed)
Pt left v/m requesting refill [facility]acl[facility]en [facility]. Last refilled # 90 x 1 on 04/29/15. Last seen 08/01/15.

## 2015-11-01 MED ORDER — BACLOFEN 10 MG PO TABS: 10.0000 mg | ORAL_TABLET | Freq: Every evening | ORAL | Status: DC | PRN

## 2015-11-12 ENCOUNTER — Emergency Department (HOSPITAL_COMMUNITY): Payer: Medicare Other

## 2015-11-12 ENCOUNTER — Encounter (HOSPITAL_COMMUNITY): Payer: Self-pay | Admitting: Emergency Medicine

## 2015-11-12 DIAGNOSIS — Z7984 Long term (current) use of oral hypoglycemic drugs: Secondary | ICD-10-CM | POA: Diagnosis not present

## 2015-11-12 DIAGNOSIS — I10 Essential (primary) hypertension: Secondary | ICD-10-CM | POA: Diagnosis not present

## 2015-11-12 DIAGNOSIS — Y939 Activity, unspecified: Secondary | ICD-10-CM | POA: Insufficient documentation

## 2015-11-12 DIAGNOSIS — Z79899 Other long term (current) drug therapy: Secondary | ICD-10-CM | POA: Insufficient documentation

## 2015-11-12 DIAGNOSIS — Z5321 Procedure and treatment not carried out due to patient leaving prior to being seen by health care provider: Secondary | ICD-10-CM | POA: Insufficient documentation

## 2015-11-12 DIAGNOSIS — Y92410 Unspecified street and highway as the place of occurrence of the external cause: Secondary | ICD-10-CM | POA: Insufficient documentation

## 2015-11-12 DIAGNOSIS — Z8673 Personal history of transient ischemic attack (TIA), and cerebral infarction without residual deficits: Secondary | ICD-10-CM | POA: Insufficient documentation

## 2015-11-12 DIAGNOSIS — E119 Type 2 diabetes mellitus without complications: Secondary | ICD-10-CM | POA: Diagnosis not present

## 2015-11-12 DIAGNOSIS — Y999 Unspecified external cause status: Secondary | ICD-10-CM | POA: Diagnosis not present

## 2015-11-12 DIAGNOSIS — Z7982 Long term (current) use of aspirin: Secondary | ICD-10-CM | POA: Insufficient documentation

## 2015-11-12 DIAGNOSIS — R51 Headache: Secondary | ICD-10-CM | POA: Diagnosis not present
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
## 2015-11-13 ENCOUNTER — Emergency Department (HOSPITAL_COMMUNITY)
Admission: EM | Admit: 2015-11-13 | Discharge: 2015-11-13 | Disposition: A | Discharge status: Home / Self Care | Payer: Medicare Other | Attending: Emergency Medicine | Admitting: Emergency Medicine

## 2015-11-17 DIAGNOSIS — Z79891 Long term (current) use of opiate analgesic: Secondary | ICD-10-CM | POA: Diagnosis not present

## 2015-11-17 DIAGNOSIS — G894 Chronic pain syndrome: Secondary | ICD-10-CM | POA: Diagnosis not present

## 2015-11-30 DIAGNOSIS — S138XXA Sprain of joints and ligaments of other parts of neck, initial encounter: Secondary | ICD-10-CM | POA: Diagnosis not present

## 2015-11-30 DIAGNOSIS — M542 Cervicalgia: Secondary | ICD-10-CM | POA: Diagnosis not present

## 2015-12-10 ENCOUNTER — Other Ambulatory Visit: Payer: Self-pay | Admitting: Family Medicine

## 2015-12-10 DIAGNOSIS — E7800 Pure hypercholesterolemia, unspecified: Secondary | ICD-10-CM

## 2015-12-16 DIAGNOSIS — S139XXD Sprain of joints and ligaments of unspecified parts of neck, subsequent encounter: Secondary | ICD-10-CM | POA: Diagnosis not present

## 2015-12-16 DIAGNOSIS — M5481 Occipital neuralgia: Secondary | ICD-10-CM | POA: Diagnosis not present

## 2015-12-16 DIAGNOSIS — M542 Cervicalgia: Secondary | ICD-10-CM | POA: Diagnosis not present

## 2015-12-22 DIAGNOSIS — M542 Cervicalgia: Secondary | ICD-10-CM | POA: Diagnosis not present

## 2016-01-03 DIAGNOSIS — M542 Cervicalgia: Secondary | ICD-10-CM | POA: Diagnosis not present

## 2016-01-10 DIAGNOSIS — M542 Cervicalgia: Secondary | ICD-10-CM | POA: Diagnosis not present

## 2016-01-18 DIAGNOSIS — M542 Cervicalgia: Secondary | ICD-10-CM | POA: Diagnosis not present

## 2016-01-24 DIAGNOSIS — M542 Cervicalgia: Secondary | ICD-10-CM | POA: Diagnosis not present

## 2016-02-01 ENCOUNTER — Other Ambulatory Visit: Payer: Self-pay | Admitting: Family Medicine

## 2016-02-01 DIAGNOSIS — E1165 Type 2 diabetes mellitus with hyperglycemia: Secondary | ICD-10-CM

## 2016-02-01 DIAGNOSIS — E7800 Pure hypercholesterolemia, unspecified: Secondary | ICD-10-CM

## 2016-02-01 DIAGNOSIS — D509 Iron deficiency anemia, unspecified: Secondary | ICD-10-CM

## 2016-02-01 DIAGNOSIS — E118 Type 2 diabetes mellitus with unspecified complications: Principal | ICD-10-CM

## 2016-02-01 DIAGNOSIS — N402 Nodular prostate without lower urinary tract symptoms: Secondary | ICD-10-CM

## 2016-02-02 ENCOUNTER — Other Ambulatory Visit (INDEPENDENT_AMBULATORY_CARE_PROVIDER_SITE_OTHER): Payer: Medicare Other

## 2016-02-02 ENCOUNTER — Ambulatory Visit (INDEPENDENT_AMBULATORY_CARE_PROVIDER_SITE_OTHER): Payer: Medicare Other

## 2016-02-02 VITALS — BP 145/80 | HR 61 | Temp 98.5°F | Ht 67.5 in | Wt 251.2 lb

## 2016-02-02 DIAGNOSIS — Z0000 Encounter for general adult medical examination without abnormal findings: Secondary | ICD-10-CM

## 2016-02-02 DIAGNOSIS — Z23 Encounter for immunization: Secondary | ICD-10-CM

## 2016-02-02 DIAGNOSIS — E118 Type 2 diabetes mellitus with unspecified complications: Secondary | ICD-10-CM

## 2016-02-02 DIAGNOSIS — E7800 Pure hypercholesterolemia, unspecified: Secondary | ICD-10-CM

## 2016-02-02 DIAGNOSIS — D509 Iron deficiency anemia, unspecified: Secondary | ICD-10-CM

## 2016-02-02 DIAGNOSIS — N402 Nodular prostate without lower urinary tract symptoms: Secondary | ICD-10-CM | POA: Diagnosis not present

## 2016-02-02 DIAGNOSIS — E1165 Type 2 diabetes mellitus with hyperglycemia: Secondary | ICD-10-CM | POA: Diagnosis not present

## 2016-02-02 LAB — CBC WITH DIFFERENTIAL/PLATELET
BASOS ABS: 0.1 10*3/uL (ref 0.0–0.1)
Basophils Relative: 1.9 % (ref 0.0–3.0)
EOS ABS: 0.2 10*3/uL (ref 0.0–0.7)
Eosinophils Relative: 4 % (ref 0.0–5.0)
HEMATOCRIT: 37.2 % — AB (ref 39.0–52.0)
Hemoglobin: 12.5 g/dL — ABNORMAL LOW (ref 13.0–17.0)
LYMPHS PCT: 34.7 % (ref 12.0–46.0)
Lymphs Abs: 1.9 10*3/uL (ref 0.7–4.0)
MCHC: 33.6 g/dL (ref 30.0–36.0)
MCV: 90 fl (ref 78.0–100.0)
Monocytes Absolute: 0.6 10*3/uL (ref 0.1–1.0)
Monocytes Relative: 12 % (ref 3.0–12.0)
NEUTROS ABS: 2.5 10*3/uL (ref 1.4–7.7)
Neutrophils Relative %: 47.4 % (ref 43.0–77.0)
PLATELETS: 270 10*3/uL (ref 150.0–400.0)
RBC: 4.14 Mil/uL — AB (ref 4.22–5.81)
RDW: 14.6 % (ref 11.5–15.5)
WBC: 5.4 10*3/uL (ref 4.0–10.5)

## 2016-02-02 LAB — COMPREHENSIVE METABOLIC PANEL
ALBUMIN: 3.6 g/dL (ref 3.5–5.2)
ALK PHOS: 58 U/L (ref 39–117)
ALT: 17 U/L (ref 0–53)
AST: 12 U/L (ref 0–37)
BILIRUBIN TOTAL: 0.3 mg/dL (ref 0.2–1.2)
BUN: 11 mg/dL (ref 6–23)
CALCIUM: 9.2 mg/dL (ref 8.4–10.5)
CO2: 32 meq/L (ref 19–32)
CREATININE: 0.85 mg/dL (ref 0.40–1.50)
Chloride: 101 mEq/L (ref 96–112)
GFR: 115.99 mL/min (ref >60.00)
Glucose, Bld: 178 mg/dL — ABNORMAL HIGH (ref 70–99)
Potassium: 4 mEq/L (ref 3.5–5.1)
Sodium: 139 mEq/L (ref 135–145)
TOTAL PROTEIN: 6.4 g/dL (ref 6.0–8.3)

## 2016-02-02 LAB — LIPID PANEL
CHOLESTEROL: 123 mg/dL (ref 0–200)
HDL: 29.3 mg/dL — AB (ref >39.00)
LDL Cholesterol: 63 mg/dL (ref 0–99)
NonHDL: 94.04
TRIGLYCERIDES: 155 mg/dL — AB (ref 0.0–149.0)
Total CHOL/HDL Ratio: 4
VLDL: 31 mg/dL (ref 0.0–40.0)

## 2016-02-02 LAB — PSA: PSA: 2.18 ng/mL (ref 0.10–4.00)

## 2016-02-02 LAB — HEMOGLOBIN A1C: Hgb A1c MFr Bld: 8.4 % — ABNORMAL HIGH (ref 4.6–6.5)

## 2016-02-02 NOTE — Patient Instructions (Signed)
[name] , Thank you for taking time to come for your Medicare Wellness Visit. I appreciate your ongoing commitment to your health goals. Please review the following plan we discussed and let me know if I can assist you in the future.   These are the goals we discussed: Goals    . Increase physical activity          Starting 02/02/2016, I will continue to participate in physical therapy as prescribed.        This is a list of the screening recommended for you and due dates:  Health Maintenance  Topic Date Due  . Eye exam for diabetics  09/26/2016*  . Complete foot exam   07/31/2016  . Hemoglobin A1C  08/02/2016  . Pneumonia vaccines (2 of 2 - PPSV23) 06/16/2017  . Colon Cancer Screening  10/10/2021  . DTaP/Tdap/Td vaccine (2 - Td) 01/27/2024  . Tetanus Vaccine  01/27/2024  . Flu Shot  Completed  . Shingles Vaccine  Completed  .  Hepatitis C: One time screening is recommended by Center for Disease Control  (CDC) for  adults born from [birth year] through 1966.   Completed  . HIV Screening  Completed  *Topic was postponed. The date shown is not the original due date.   Preventive Care for Adults  A healthy lifestyle and preventive care can promote health and wellness. Preventive health guidelines for adults include the following key practices.  . A routine yearly physical is a good way to check with your health care provider about your health and preventive screening. It is a chance to share any concerns and updates on your health and to receive a thorough exam.  . Visit your dentist for a routine exam and preventive care every 6 months. Brush your teeth twice a day and floss once a day. Good oral hygiene prevents tooth decay and gum disease.  . The frequency of eye exams is based on your age, health, family medical history, use  of contact lenses, and other factors. Follow your health care provider's ecommendations for frequency of eye exams.  . Eat a healthy diet. Foods like  vegetables, fruits, whole grains, low-fat dairy products, and lean protein foods contain the nutrients you need without too many calories. Decrease your intake of foods high in solid fats, added sugars, and salt. Eat the right amount of calories for you. Get information about a proper diet from your health care provider, if necessary.  . Regular physical exercise is one of the most important things you can do for your health. Most adults should get at least 150 minutes of moderate-intensity exercise (any activity that increases your heart rate and causes you to sweat) each week. In addition, most adults need muscle-strengthening exercises on 2 or more days a week.  Silver Sneakers may be a benefit available to you. To determine eligibility, you may visit the website: www.silversneakers.com or contact program at [phone] Mon-Fri between 8AM-8PM.   . Maintain a healthy weight. The body mass index (BMI) is a screening tool to identify possible weight problems. It provides an estimate of body fat based on height and weight. Your health care provider can find your BMI and can help you achieve or maintain a healthy weight.   For adults 20 years and older: ? A BMI below 18.5 is considered underweight. ? A BMI of 18.5 to 24.9 is normal. ? A BMI of 25 to 29.9 is considered overweight. ? A BMI of 30 and above is  considered obese.   . Maintain normal blood lipids and cholesterol levels by exercising and minimizing your intake of saturated fat. Eat a balanced diet with plenty of fruit and vegetables. Blood tests for lipids and cholesterol should begin at age 60 and be repeated every 5 years. If your lipid or cholesterol levels are high, you are over 50, or you are at high risk for heart disease, you may need your cholesterol levels checked more frequently. Ongoing high lipid and cholesterol levels should be treated with medicines if diet and exercise are not working.  . If you smoke, find out from your  health care provider how to quit. If you do not use tobacco, please do not start.  . If you choose to drink alcohol, please do not consume more than 2 drinks per day. One drink is considered to be 12 ounces (355 mL) of beer, 5 ounces (148 mL) of wine, or 1.5 ounces (44 mL) of liquor.  . If you are 2-68 years old, ask your health care provider if you should take aspirin to prevent strokes.  . Use sunscreen. Apply sunscreen liberally and repeatedly throughout the day. You should seek shade when your shadow is shorter than you. Protect yourself by wearing long sleeves, pants, a wide-brimmed hat, and sunglasses year round, whenever you are outdoors.  . Once a month, do a whole body skin exam, using a mirror to look at the skin on your back. Tell your health care provider of new moles, moles that have irregular borders, moles that are larger than a pencil eraser, or moles that have changed in shape or color.

## 2016-02-02 NOTE — Progress Notes (Signed)
PCP notes:   Health maintenance:  Flu vaccine - administered A1C - completed Eye exam - pt strongly encouraged to schedule future appt  Abnormal screenings:   Hearing - failed  Patient concerns:   None  Nurse concerns:  Pt's pulse decreased to low 40s. Pt remained alert, oriented, and asymptomatic throughout encounter. PCP notified.   Next PCP appt:   02/06/16 @ 0830

## 2016-02-02 NOTE — Progress Notes (Signed)
Pre visit review using our clinic review tool, if applicable. No additional management support is needed unless otherwise documented below in the visit note. 

## 2016-02-02 NOTE — Progress Notes (Signed)
Subjective:   [name] is a 66 y.o. male who presents for Medicare Annual/Subsequent preventive examination.  Review of Systems:  N/A Cardiac Risk Factors include: advanced age (>34men, >22 women);male gender;obesity (BMI >30kg/m2);diabetes mellitus;dyslipidemia;hypertension     Objective:    Vitals: BP (!) 145/80 (BP Location: Right Arm, Patient Position: Sitting, Cuff Size: Normal)   Pulse 61   Temp 98.5 F (36.9 C) (Oral)   Ht 5' 7.5" (1.715 m) Comment: no shoes  Wt 251 lb 4 oz (114 kg)   SpO2 96%   BMI 38.77 kg/m   Body mass index is 38.77 kg/m.  Tobacco History  Smoking Status  . Never Smoker  Smokeless Tobacco  . Never Used     Counseling given: No   Past Medical History:  Diagnosis Date  . Abdominal wall hernia   . Anxiety   . Back pain    with radiculopathy  . Bladder wall thickening 2013   nl rpt CT scan, normal cytology and UCx, released from urology Karsten Ro)  . Chest pain, non-cardiac 2008   adenosine myoview low risk.  EF 60%.  normal cath 2008, deemed atypical /noncardiac, ?discogenic  . Chronic neck pain   . Chronic pain    narcotic dependent ([place])  . Circadian rhythm sleep disorder, irregular sleep-wake type   . Complication of anesthesia    hard to put patient to sleep  . Depression   . Diverticulosis 2013   by CT scan  . Esophageal stricture 06/2007   dilation of stricture  . GERD (gastroesophageal reflux disease)   . Gross hematuria 2013, 2016   negative CT, cystoscopy, cytology, 2016 - thought from E coli UTI s/p appropriate treatment ([place])  . History of cocaine use 2004  . History of epididymitis   . History of prostatitis 08/2011   ?chronic/sim[place], treated with 1 mo cipro  . Hyperlipidemia   . Hypertension    Essential  . OSA (obstructive sleep apnea) 2010   mild off CPAP  . Postlaminectomy syndrome of cervical region    s/p ACDF C5-7 2010 ([place])  . T2DM (type 2 diabetes mellitus) ([place])   . TIA (transient  ischemic attack)    cocaine related   Past Surgical History:  Procedure Laterality Date  . ANTERIOR CERVICAL DISCECTOMY  10/28/2008   with fusion (Dr. Rolena Infante)  . CARDIAC CATHETERIZATION  2008   WNL   . CARPAL TUNNEL RELEASE     Right  . CATARACT EXTRACTION Bilateral 12/2014, 01/2015   Dr Tommy Rainwater  . COLONOSCOPY  10/11/2011   severe diverticulosis, int hemorrhoids, rec rpt 5 yrs Carlean Purl)  . DIRECT LARYNGOSCOPY  11/02/2008   Extubation under anesthesia (Dr. Erik Obey)  . ESOPHAGOGASTRODUODENOSCOPY  07/17/07   Esoph. Stricture dilated (Dr. Henrene Pastor)  . ESOPHAGOGASTRODUODENOSCOPY  10/11/2011   WNL Carlean Purl)  . HEMATOMA EVACUATION  10/29/2008   (Dr. Rolena Infante)  . INGUINAL HERNIA REPAIR     bilateral, 5 cm. RLQ Abd Lipoma Excision (Dr. Lucia Gaskins)  . SHOULDER ARTHROSCOPY WITH SUBACROMIAL DECOMPRESSION AND OPEN ROTATOR C Left 01/23/2013   Procedure: LEFT SHOULDER ARTHROSCOPY WITH SUBACROMIAL DECOMPRESSION AND MINI OPEN ROTATOR CUFF REPAIR, AND OPEN DISTAL CLAVICLE RESECTION;  Surgeon: Augustin Schooling, MD;  Location: [facility];  Service: Orthopedics;  Laterality: Left;  . SHOULDER SURGERY Right 2013   replaced  . TONSILLECTOMY  childhood  . TRIGGER FINGER RELEASE     Right long finger   Family History  Problem Relation Age of Onset  . Diabetes Mother   .  Hypertension Mother   . Hyperlipidemia Mother   . Diabetes Father   . Diabetes Sister   . Hypertension Sister   . Hypertension Brother   . Diabetes Brother   . Diabetes Brother   . Hypertension Brother   . Diabetes Brother   . Heart disease Maternal Uncle     Heart failure  . Cancer Paternal Uncle     Prostate  . Cancer Paternal Grandfather     Prostate  . Stroke Neg Hx    History  Sexual Activity  . Sexual activity: No    Outpatient Encounter Prescriptions as of 02/02/2016  Medication Sig  . aspirin EC 81 MG tablet Take 81 mg by mouth daily.  Marland Kitchen atorvastatin (LIPITOR) 10 MG tablet Take 1 tablet by mouth  daily  . baclofen (LIORESAL)  10 MG tablet Take 1 tablet (10 mg total) by mouth at bedtime as needed (for back spasms).  . gabapentin (NEURONTIN) 300 MG capsule Take 1 capsule by mouth two times daily  . glipiZIDE (GLUCOTROL XL) 5 MG 24 hr tablet Take 1 tablet (5 mg total) by mouth daily with breakfast.  . glucose blood (ONE TOUCH ULTRA TEST) test strip Use to test sugar twice daily. Dx: E11.8; E11.65  . losartan (COZAAR) 25 MG tablet Take 1 tablet by mouth  daily  . metFORMIN (GLUCOPHAGE) 850 MG tablet Take 1 tablet by mouth two  times daily with meals  . omeprazole (PRILOSEC) 40 MG capsule Take 1 capsule by mouth  daily  . oxyCODONE-acetaminophen (PERCOCET) 10-325 MG per tablet Take 1-2 tablets by mouth every 4 (four) hours as needed for pain.  . sildenafil (VIAGRA) 100 MG tablet Take 0.5-1 tablets (50-100 mg total) by mouth daily as needed for erectile dysfunction.  . tamsulosin (FLOMAX) 0.4 MG CAPS capsule Take 1 capsule (0.4 mg total) by mouth daily after supper.  . cetirizine (ZYRTEC) 10 MG tablet Take 1 tablet (10 mg total) by mouth daily. (Patient not taking: Reported on 02/02/2016)  . fluticasone (FLONASE) 50 MCG/ACT nasal spray Place 2 sprays into both nostrils daily. (Patient not taking: Reported on 02/02/2016)   No facility-administered encounter medications on file as of 02/02/2016.     Activities of Daily Living In your present state of health, do you have any difficulty performing the following activities: 02/02/2016  Hearing? N  Vision? N  Difficulty concentrating or making decisions? N  Walking or climbing stairs? N  Dressing or bathing? N  Doing errands, shopping? N  Preparing Food and eating ? N  Using the Toilet? N  In the past six months, have you accidently leaked urine? N  Do you have problems with loss of bowel control? N  Managing your Medications? N  Managing your Finances? N  Housekeeping or managing your Housekeeping? N  Some recent data might be hidden    Patient Care Team: Ria Bush, MD as PCP - General (Family Medicine) Tonia Ghent, MD (Family Medicine) Calton Dach, MD as Referring Physician (Optometry) Netta Cedars, MD as Consulting Physician (Orthopedic Surgery) Melina Schools, MD as Consulting Physician (Orthopedic Surgery)   Assessment:     Hearing Screening   125Hz  250Hz  500Hz  1000Hz  2000Hz  3000Hz  4000Hz  6000Hz  8000Hz   Right ear:   0 40 40  40    Left ear:   0 0 40  40    Vision Screening Comments: Last vision exam in 2016 with Dr Shirley Muscat   Exercise Activities and Dietary recommendations Current Exercise Habits: Structured  exercise class, Type of exercise: Other - see comments (physical therapy), Time (Minutes): 60, Frequency (Times/Week): 1, Weekly Exercise (Minutes/Week): 60, Intensity: Mild, Exercise limited by: orthopedic condition(s)  Goals    . Increase physical activity          Starting 02/02/2016, I will continue to participate in physical therapy as prescribed.       Fall Risk Fall Risk  02/02/2016 01/31/2015 01/26/2014 06/16/2012  Falls in the past year? No No No No   Depression Screen PHQ 2/9 Scores 02/02/2016 01/31/2015 01/26/2014 06/16/2012  PHQ - 2 Score 0 0 0 5  PHQ- 9 Score - - - 15    Cognitive Testing MMSE - Mini Mental State Exam 02/02/2016  Orientation to time 5  Orientation to Place 5  Registration 3  Attention/ Calculation 0  Recall 3  Language- name 2 objects 0  Language- repeat 1  Language- follow 3 step command 3  Language- read & follow direction 0  Write a sentence 0  Copy design 0  Total score 20   PLEASE NOTE: A Mini-Cog screen was completed. Maximum score is 20. A value of 0 denotes this part of [place] MMSE was not completed or the patient failed this part of the Mini-Cog screening.   Mini-Cog Screening Orientation to Time - Max 5 pts Orientation to Place - Max 5 pts Registration - Max 3 pts Recall - Max 3 pts Language Repeat - Max 1 pts Language Follow 3 Step Command - Max 3  pts  Immunization History  Administered Date(s) Administered  . Influenza Split 02/06/2011, 01/08/2012  . Influenza Whole 03/19/2007, 02/19/2008, 01/27/2009, 01/25/2010  . Influenza,inj,Quad PF,36+ Mos 02/20/2013, 01/26/2014, 01/31/2015, 02/02/2016  . Pneumococcal Conjugate-13 08/01/2015  . Pneumococcal Polysaccharide-23 06/16/2012  . Td 04/24/2003  . Tdap 01/26/2014  . Zoster 12/15/2010   Screening Tests Health Maintenance  Topic Date Due  . OPHTHALMOLOGY EXAM  09/26/2016 (Originally 09/28/2015)  . FOOT EXAM  07/31/2016  . HEMOGLOBIN A1C  08/02/2016  . PNA vac Low Risk Adult (2 of 2 - PPSV23) 06/16/2017  . COLONOSCOPY  10/10/2021  . DTaP/Tdap/Td (2 - Td) 01/27/2024  . TETANUS/TDAP  01/27/2024  . INFLUENZA VACCINE  Completed  . ZOSTAVAX  Completed  . Hepatitis C Screening  Completed  . HIV Screening  Completed      Plan:     I have personally reviewed and addressed the Medicare Annual Wellness questionnaire and have noted the following in the patient's chart:  A. Medical and social history B. Use of alcohol, tobacco or illicit drugs  C. Current medications and supplements D. Functional ability and status E.  Nutritional status F.  Physical activity G. Advance directives H. List of other physicians I.  Hospitalizations, surgeries, and ER visits in previous 12 months J.  [place] to include hearing, vision, cognitive, depression L. Referrals and appointments - none  In addition, I have reviewed and discussed with patient certain preventive protocols, quality metrics, and best practice recommendations. A written personalized care plan for preventive services as well as general preventive health recommendations were provided to patient.  See attached scanned questionnaire for additional information.   Signed,   Lindell Noe, MHA, BS, LPN Health Coach

## 2016-02-03 NOTE — Progress Notes (Signed)
I reviewed health advisor's note, was available for consultation, and agree with documentation and plan.  

## 2016-02-04 ENCOUNTER — Other Ambulatory Visit: Payer: Self-pay | Admitting: Family Medicine

## 2016-02-04 MED ORDER — GLIPIZIDE ER 10 MG PO TB24: 10.0000 mg | ORAL_TABLET | Freq: Every day | ORAL | 1 refills | Status: DC

## 2016-02-06 ENCOUNTER — Encounter: Payer: Medicare Other | Admitting: Family Medicine

## 2016-02-06 DIAGNOSIS — S138XXD Sprain of joints and ligaments of other parts of neck, subsequent encounter: Secondary | ICD-10-CM | POA: Diagnosis not present

## 2016-02-06 DIAGNOSIS — M542 Cervicalgia: Secondary | ICD-10-CM | POA: Diagnosis not present

## 2016-03-09 ENCOUNTER — Encounter: Payer: Self-pay | Admitting: Family Medicine

## 2016-03-09 ENCOUNTER — Ambulatory Visit (INDEPENDENT_AMBULATORY_CARE_PROVIDER_SITE_OTHER): Payer: Medicare Other | Admitting: Family Medicine

## 2016-03-09 VITALS — BP 136/86 | HR 80 | Temp 98.4°F | Wt 246.8 lb

## 2016-03-09 DIAGNOSIS — IMO0001 Reserved for inherently not codable concepts without codable children: Secondary | ICD-10-CM

## 2016-03-09 DIAGNOSIS — E1165 Type 2 diabetes mellitus with hyperglycemia: Secondary | ICD-10-CM | POA: Diagnosis not present

## 2016-03-09 DIAGNOSIS — D170 Benign lipomatous neoplasm of skin and subcutaneous tissue of head, face and neck: Secondary | ICD-10-CM

## 2016-03-09 DIAGNOSIS — Z7189 Other specified counseling: Secondary | ICD-10-CM | POA: Diagnosis not present

## 2016-03-09 DIAGNOSIS — Z0001 Encounter for general adult medical examination with abnormal findings: Secondary | ICD-10-CM

## 2016-03-09 DIAGNOSIS — D509 Iron deficiency anemia, unspecified: Secondary | ICD-10-CM

## 2016-03-09 DIAGNOSIS — N402 Nodular prostate without lower urinary tract symptoms: Secondary | ICD-10-CM

## 2016-03-09 DIAGNOSIS — E669 Obesity, unspecified: Secondary | ICD-10-CM

## 2016-03-09 DIAGNOSIS — E7800 Pure hypercholesterolemia, unspecified: Secondary | ICD-10-CM

## 2016-03-09 DIAGNOSIS — E118 Type 2 diabetes mellitus with unspecified complications: Secondary | ICD-10-CM

## 2016-03-09 DIAGNOSIS — Z8673 Personal history of transient ischemic attack (TIA), and cerebral infarction without residual deficits: Secondary | ICD-10-CM

## 2016-03-09 DIAGNOSIS — J309 Allergic rhinitis, unspecified: Secondary | ICD-10-CM

## 2016-03-09 DIAGNOSIS — G459 Transient cerebral ischemic attack, unspecified: Secondary | ICD-10-CM

## 2016-03-09 DIAGNOSIS — I10 Essential (primary) hypertension: Secondary | ICD-10-CM

## 2016-03-09 MED ORDER — FLUTICASONE PROPIONATE 50 MCG/ACT NA SUSP: 2.0000 | Freq: Every day | NASAL | 11 refills | Status: DC

## 2016-03-09 NOTE — Assessment & Plan Note (Signed)
Requests flonase refill.

## 2016-03-09 NOTE — Assessment & Plan Note (Signed)
Preventative protocols reviewed and updated unless pt declined. Discussed healthy diet and lifestyle.  

## 2016-03-09 NOTE — Assessment & Plan Note (Addendum)
Mild anemia noted today - consider updated anemia panel next labs. Colonoscopy UTD. Continues PPI dialy.

## 2016-03-09 NOTE — Assessment & Plan Note (Signed)
Chronic, stable. Continue current regimen. 
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
## 2016-03-09 NOTE — Assessment & Plan Note (Signed)
Reviewed benign nature of condition.

## 2016-03-09 NOTE — Assessment & Plan Note (Signed)
Continue aspirin daily.  

## 2016-03-09 NOTE — Assessment & Plan Note (Addendum)
Advanced directive - has daughter as HCPOA. Has discussed this with daughter. Does not have advanced directive. Advised to bring [place] a copy.

## 2016-03-09 NOTE — Assessment & Plan Note (Signed)
Discussed healthy diet and lifestyle changes to affect sustainable weight loss  

## 2016-03-09 NOTE — Assessment & Plan Note (Signed)
Pt reports improved glycemic control since addition of glipizide 10mg  XL. Will return 3 mo DM f/u visit.
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
## 2016-03-09 NOTE — Patient Instructions (Signed)
We will refer you back to Dr Karsten Ro to check prostate.  You are doing well. Restart exercise regimen when cleared. Continue healthy diet choices. Return in 3 months for diabetes follow up  Health Maintenance, Male A healthy lifestyle and preventative care can promote health and wellness.  Maintain regular health, dental, and eye exams.  Eat a healthy diet. Foods like vegetables, fruits, whole grains, low-fat dairy products, and lean protein foods contain the nutrients you need and are low in calories. Decrease your intake of foods high in solid fats, added sugars, and salt. Get information about a proper diet from your health care provider, if necessary.  Regular physical exercise is one of the most important things you can do for your health. Most adults should get at least 150 minutes of moderate-intensity exercise (any activity that increases your heart rate and causes you to sweat) each week. In addition, most adults need muscle-strengthening exercises on 2 or more days a week.   Maintain a healthy weight. The body mass index (BMI) is a screening tool to identify possible weight problems. It provides an estimate of body fat based on height and weight. Your health care provider can find your BMI and can help you achieve or maintain a healthy weight. For males 20 years and older:  A BMI below 18.5 is considered underweight.  A BMI of 18.5 to 24.9 is normal.  A BMI of 25 to 29.9 is considered overweight.  A BMI of 30 and above is considered obese.  Maintain normal blood lipids and cholesterol by exercising and minimizing your intake of saturated fat. Eat a balanced diet with plenty of fruits and vegetables. Blood tests for lipids and cholesterol should begin at age 52 and be repeated every 5 years. If your lipid or cholesterol levels are high, you are over age 65, or you are at high risk for heart disease, you may need your cholesterol levels checked more frequently.Ongoing high lipid and  cholesterol levels should be treated with medicines if diet and exercise are not working.  If you smoke, find out from your health care provider how to quit. If you do not use tobacco, do not start.  Lung cancer screening is recommended for adults aged 30-80 years who are at high risk for developing lung cancer because of a history of smoking. A yearly low-dose CT scan of the lungs is recommended for people who have at least a 30-pack-year history of smoking and are current smokers or have quit within the past 15 years. A pack year of smoking is smoking an average of 1 pack of cigarettes a day for 1 year (for example, a 30-pack-year history of smoking could mean smoking 1 pack a day for 30 years or 2 packs a day for 15 years). Yearly screening should continue until the smoker has stopped smoking for at least 15 years. Yearly screening should be stopped for people who develop a health problem that would prevent them from having lung cancer treatment.  If you choose to drink alcohol, do not have more than 2 drinks per day. One drink is considered to be 12 oz (360 mL) of beer, 5 oz (150 mL) of wine, or 1.5 oz (45 mL) of liquor.  Avoid the use of street drugs. Do not share needles with anyone. Ask for help if you need support or instructions about stopping the use of drugs.  High blood pressure causes heart disease and increases the risk of stroke. High blood pressure is more  likely to develop in:  People who have blood pressure in the end of the normal range (100-139/85-89 mm Hg).  People who are overweight or obese.  People who are African American.  If you are 70-74 years of age, have your blood pressure checked every 3-5 years. If you are 33 years of age or older, have your blood pressure checked every year. You should have your blood pressure measured twice-once when you are at a hospital or clinic, and once when you are not at a hospital or clinic. Record the average of the two measurements. To  check your blood pressure when you are not at a hospital or clinic, you can use:  An automated blood pressure machine at a pharmacy.  A home blood pressure monitor.  If you are 64-64 years old, ask your health care provider if you should take aspirin to prevent heart disease.  Diabetes screening involves taking a blood sample to check your fasting blood sugar level. This should be done once every 3 years after age 56 if you are at a normal weight and without risk factors for diabetes. Testing should be considered at a younger age or be carried out more frequently if you are overweight and have at least 1 risk factor for diabetes.  Colorectal cancer can be detected and often prevented. Most routine colorectal cancer screening begins at the age of 81 and continues through age 78. However, your health care provider may recommend screening at an earlier age if you have risk factors for colon cancer. On a yearly basis, your health care provider may provide home test kits to check for hidden blood in the stool. A small camera at the end of a tube may be used to directly examine the colon (sigmoidoscopy or colonoscopy) to detect the earliest forms of colorectal cancer. Talk to your health care provider about this at age 56 when routine screening begins. A direct exam of the colon should be repeated every 5-10 years through age 6, unless early forms of precancerous polyps or small growths are found.  People who are at an increased risk for hepatitis B should be screened for this virus. You are considered at high risk for hepatitis B if:  You were born in a country where hepatitis B occurs often. Talk with your health care provider about which countries are considered high risk.  Your parents were born in a high-risk country and you have not received a shot to protect against hepatitis B (hepatitis B vaccine).  You have HIV or AIDS.  You use needles to inject street drugs.  You live with, or have sex  with, someone who has hepatitis B.  You are a man who has sex with other men (MSM).  You get hemodialysis treatment.  You take certain medicines for conditions like cancer, organ transplantation, and autoimmune conditions.  Hepatitis C blood testing is recommended for all people born from [birth year] through 1966 and any individual with known risk factors for hepatitis C.  Healthy men should no longer receive prostate-specific antigen (PSA) blood tests as part of routine cancer screening. Talk to your health care provider about prostate cancer screening.  Testicular cancer screening is not recommended for adolescents or adult males who have no symptoms. Screening includes self-exam, a health care provider exam, and other screening tests. Consult with your health care provider about any symptoms you have or any concerns you have about testicular cancer.  Practice safe sex. Use condoms and avoid high-risk sexual practices  to reduce the spread of sexually transmitted infections (STIs).  You should be screened for STIs, including gonorrhea and chlamydia if:  You are sexually active and are younger than 24 years.  You are older than 24 years, and your health care provider tells you that you are at risk for this type of infection.  Your sexual activity has changed since you were last screened, and you are at an increased risk for chlamydia or gonorrhea. Ask your health care provider if you are at risk.  If you are at risk of being infected with HIV, it is recommended that you take a prescription medicine daily to prevent HIV infection. This is called pre-exposure prophylaxis (PrEP). You are considered at risk if:  You are a man who has sex with other men (MSM).  You are a heterosexual man who is sexually active with multiple partners.  You take drugs by injection.  You are sexually active with a partner who has HIV.  Talk with your health care provider about whether you are at high risk of being  infected with HIV. If you choose to begin PrEP, you should first be tested for HIV. You should then be tested every 3 months for as long as you are taking PrEP.  Use sunscreen. Apply sunscreen liberally and repeatedly throughout the day. You should seek shade when your shadow is shorter than you. Protect yourself by wearing long sleeves, pants, a wide-brimmed hat, and sunglasses year round whenever you are outdoors.  Tell your health care provider of new moles or changes in moles, especially if there is a change in shape or color. Also, tell your health care provider if a mole is larger than the size of a pencil eraser.  A one-time screening for abdominal aortic aneurysm (AAA) and surgical repair of large AAAs by ultrasound is recommended for men aged 57-75 years who are current or former smokers.  Stay current with your vaccines (immunizations). This information is not intended to replace advice given to you by your health care provider. Make sure you discuss any questions you have with your health care provider. Document Released: 10/06/2007 Document Revised: 04/30/2014 Document Reviewed: 01/11/2015 Elsevier Interactive Patient Education  2017 [employer].

## 2016-03-09 NOTE — Progress Notes (Signed)
Pre visit review using our clinic review tool, if applicable. No additional management support is needed unless otherwise documented below in the visit note. 

## 2016-03-09 NOTE — Assessment & Plan Note (Signed)
Prostate nodule again palpated in h/o BPH. PSA stable. Reviewed recent urology notes - no mention of nodule. Will refer back to Dr Karsten Ro.

## 2016-03-10 ENCOUNTER — Other Ambulatory Visit: Payer: Self-pay | Admitting: Family Medicine

## 2016-03-10 DIAGNOSIS — G894 Chronic pain syndrome: Secondary | ICD-10-CM | POA: Diagnosis not present

## 2016-03-10 DIAGNOSIS — M5481 Occipital neuralgia: Secondary | ICD-10-CM | POA: Diagnosis not present

## 2016-03-10 DIAGNOSIS — E7800 Pure hypercholesterolemia, unspecified: Secondary | ICD-10-CM

## 2016-03-10 DIAGNOSIS — M542 Cervicalgia: Secondary | ICD-10-CM | POA: Diagnosis not present

## 2016-03-12 DIAGNOSIS — H35033 Hypertensive retinopathy, bilateral: Secondary | ICD-10-CM | POA: Diagnosis not present

## 2016-03-12 DIAGNOSIS — H11153 Pinguecula, bilateral: Secondary | ICD-10-CM | POA: Diagnosis not present

## 2016-03-12 DIAGNOSIS — H11133 Conjunctival pigmentations, bilateral: Secondary | ICD-10-CM | POA: Diagnosis not present

## 2016-03-12 DIAGNOSIS — H5202 Hypermetropia, left eye: Secondary | ICD-10-CM | POA: Diagnosis not present

## 2016-03-12 DIAGNOSIS — E119 Type 2 diabetes mellitus without complications: Secondary | ICD-10-CM | POA: Diagnosis not present

## 2016-03-12 LAB — HM DIABETES EYE EXAM

## 2016-03-21 ENCOUNTER — Encounter: Payer: Self-pay | Admitting: *Deleted

## 2016-03-27 DIAGNOSIS — N402 Nodular prostate without lower urinary tract symptoms: Secondary | ICD-10-CM | POA: Diagnosis not present

## 2016-04-17 ENCOUNTER — Other Ambulatory Visit: Payer: Self-pay

## 2016-04-17 MED ORDER — BACLOFEN 10 MG PO TABS: 10.0000 mg | ORAL_TABLET | Freq: Every evening | ORAL | 1 refills | Status: DC | PRN

## 2016-04-17 NOTE — Telephone Encounter (Signed)
Pt left v/m requesting refill baclofen for muscle spasms to [facility]. Last refilled # 90 x 1 on 11/01/15 and last annual 03/09/16.

## 2016-04-24 DIAGNOSIS — N402 Nodular prostate without lower urinary tract symptoms: Secondary | ICD-10-CM | POA: Diagnosis not present

## 2016-05-01 DIAGNOSIS — C61 Malignant neoplasm of prostate: Secondary | ICD-10-CM | POA: Diagnosis not present

## 2016-05-04 ENCOUNTER — Encounter: Payer: Self-pay | Admitting: Family Medicine

## 2016-05-17 DIAGNOSIS — C61 Malignant neoplasm of prostate: Secondary | ICD-10-CM | POA: Diagnosis not present

## 2016-05-31 ENCOUNTER — Other Ambulatory Visit: Payer: Self-pay | Admitting: Family Medicine

## 2016-06-01 DIAGNOSIS — G894 Chronic pain syndrome: Secondary | ICD-10-CM | POA: Diagnosis not present

## 2016-06-01 DIAGNOSIS — M961 Postlaminectomy syndrome, not elsewhere classified: Secondary | ICD-10-CM | POA: Diagnosis not present

## 2016-06-13 ENCOUNTER — Ambulatory Visit: Payer: Medicare Other | Admitting: Family Medicine

## 2016-06-13 DIAGNOSIS — Z0289 Encounter for other administrative examinations: Secondary | ICD-10-CM

## 2016-06-18 ENCOUNTER — Ambulatory Visit: Payer: Self-pay | Admitting: Surgery

## 2016-06-18 DIAGNOSIS — R221 Localized swelling, mass and lump, neck: Secondary | ICD-10-CM | POA: Diagnosis not present
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
## 2016-07-26 ENCOUNTER — Other Ambulatory Visit: Payer: Self-pay | Admitting: Urology

## 2016-07-26 DIAGNOSIS — C61 Malignant neoplasm of prostate: Secondary | ICD-10-CM

## 2016-08-07 ENCOUNTER — Telehealth: Payer: Self-pay

## 2016-08-07 ENCOUNTER — Other Ambulatory Visit: Payer: Self-pay | Admitting: Family Medicine

## 2016-08-07 MED ORDER — BACLOFEN 10 MG PO TABS: 10.0000 mg | ORAL_TABLET | Freq: Every evening | ORAL | 0 refills | Status: DC | PRN

## 2016-08-07 NOTE — Telephone Encounter (Signed)
Pt request refill baclofen to [facility]. Advised pt should have available refill; pt will ck with pharmacy.

## 2016-08-07 NOTE — Addendum Note (Signed)
Addended by: Helene Shoe on: 08/07/2016 09:30 AM   Modules accepted: Orders

## 2016-08-07 NOTE — Telephone Encounter (Signed)
Pt spoke with [facility] and they did not have refill for baclofen; pt last got on 06/20/16; pt does not need med now but wants refill in place when needed. Will send the refill that [facility] did not receive. Pt voiced understanding.

## 2016-08-13 ENCOUNTER — Ambulatory Visit
Admission: RE | Admit: 2016-08-13 | Discharge: 2016-08-13 | Disposition: A | Discharge status: Home / Self Care | Payer: Medicare Other | Source: Ambulatory Visit | Attending: Urology | Admitting: Urology

## 2016-08-13 DIAGNOSIS — C61 Malignant neoplasm of prostate: Secondary | ICD-10-CM

## 2016-08-13 MED ORDER — GADOBENATE DIMEGLUMINE 529 MG/ML IV SOLN
20.0000 mL | Freq: Once | INTRAVENOUS | Status: AC | PRN
  Administered 2016-08-13: 20 mL via INTRAVENOUS

## 2016-08-21 ENCOUNTER — Encounter (HOSPITAL_BASED_OUTPATIENT_CLINIC_OR_DEPARTMENT_OTHER): Payer: Self-pay | Admitting: *Deleted

## 2016-08-21 DIAGNOSIS — R972 Elevated prostate specific antigen [PSA]: Secondary | ICD-10-CM | POA: Diagnosis not present

## 2016-08-21 HISTORY — PX: LIPOMA EXCISION: SHX5283

## 2016-08-27 ENCOUNTER — Encounter (HOSPITAL_BASED_OUTPATIENT_CLINIC_OR_DEPARTMENT_OTHER)
Admission: RE | Admit: 2016-08-27 | Discharge: 2016-08-27 | Disposition: A | Discharge status: Home / Self Care | Payer: Medicare Other | Source: Ambulatory Visit | Attending: Surgery | Admitting: Surgery

## 2016-08-27 DIAGNOSIS — E669 Obesity, unspecified: Secondary | ICD-10-CM | POA: Diagnosis not present

## 2016-08-27 DIAGNOSIS — K219 Gastro-esophageal reflux disease without esophagitis: Secondary | ICD-10-CM | POA: Diagnosis not present

## 2016-08-27 DIAGNOSIS — Z6837 Body mass index (BMI) 37.0-37.9, adult: Secondary | ICD-10-CM | POA: Diagnosis not present

## 2016-08-27 DIAGNOSIS — Z7982 Long term (current) use of aspirin: Secondary | ICD-10-CM | POA: Diagnosis not present

## 2016-08-27 DIAGNOSIS — Z79899 Other long term (current) drug therapy: Secondary | ICD-10-CM | POA: Diagnosis not present

## 2016-08-27 DIAGNOSIS — G4730 Sleep apnea, unspecified: Secondary | ICD-10-CM | POA: Diagnosis not present

## 2016-08-27 DIAGNOSIS — I10 Essential (primary) hypertension: Secondary | ICD-10-CM | POA: Diagnosis not present

## 2016-08-27 DIAGNOSIS — F419 Anxiety disorder, unspecified: Secondary | ICD-10-CM | POA: Diagnosis not present

## 2016-08-27 DIAGNOSIS — E119 Type 2 diabetes mellitus without complications: Secondary | ICD-10-CM | POA: Diagnosis not present

## 2016-08-27 DIAGNOSIS — D170 Benign lipomatous neoplasm of skin and subcutaneous tissue of head, face and neck: Secondary | ICD-10-CM | POA: Diagnosis not present

## 2016-08-27 DIAGNOSIS — Z7984 Long term (current) use of oral hypoglycemic drugs: Secondary | ICD-10-CM | POA: Diagnosis not present

## 2016-08-27 LAB — BASIC METABOLIC PANEL
ANION GAP: 8 (ref 5–15)
BUN: 10 mg/dL (ref 6–20)
CALCIUM: 9.2 mg/dL (ref 8.9–10.3)
CO2: 27 mmol/L (ref 22–32)
Chloride: 105 mmol/L (ref 101–111)
Creatinine, Ser: 0.87 mg/dL (ref 0.61–1.24)
Glucose, Bld: 160 mg/dL — ABNORMAL HIGH (ref 65–99)
Potassium: 4.7 mmol/L (ref 3.5–5.1)
SODIUM: 140 mmol/L (ref 135–145)

## 2016-08-27 NOTE — Progress Notes (Signed)
EKG completed and reviewed by Dr. Marcie Bal, will proceed with surgery as scheduled.   Bottled water given with instructions to complete by 0515, pt verbalized understanding.

## 2016-08-29 ENCOUNTER — Ambulatory Visit (HOSPITAL_BASED_OUTPATIENT_CLINIC_OR_DEPARTMENT_OTHER): Payer: Medicare Other | Admitting: Anesthesiology

## 2016-08-29 ENCOUNTER — Encounter (HOSPITAL_BASED_OUTPATIENT_CLINIC_OR_DEPARTMENT_OTHER): Payer: Self-pay | Admitting: Anesthesiology

## 2016-08-29 ENCOUNTER — Ambulatory Visit (HOSPITAL_BASED_OUTPATIENT_CLINIC_OR_DEPARTMENT_OTHER)
Admission: RE | Admit: 2016-08-29 | Discharge: 2016-08-29 | Disposition: A | Discharge status: Home / Self Care | Payer: Medicare Other | Source: Ambulatory Visit | Attending: Surgery | Admitting: Surgery

## 2016-08-29 ENCOUNTER — Encounter (HOSPITAL_BASED_OUTPATIENT_CLINIC_OR_DEPARTMENT_OTHER)
Admission: RE | Disposition: A | Discharge status: Home / Self Care | Payer: Self-pay | Source: Ambulatory Visit | Attending: Surgery

## 2016-08-29 DIAGNOSIS — I10 Essential (primary) hypertension: Secondary | ICD-10-CM | POA: Insufficient documentation

## 2016-08-29 DIAGNOSIS — D170 Benign lipomatous neoplasm of skin and subcutaneous tissue of head, face and neck: Secondary | ICD-10-CM | POA: Diagnosis not present

## 2016-08-29 DIAGNOSIS — F419 Anxiety disorder, unspecified: Secondary | ICD-10-CM | POA: Insufficient documentation

## 2016-08-29 DIAGNOSIS — E119 Type 2 diabetes mellitus without complications: Secondary | ICD-10-CM | POA: Diagnosis not present

## 2016-08-29 DIAGNOSIS — K219 Gastro-esophageal reflux disease without esophagitis: Secondary | ICD-10-CM | POA: Diagnosis not present

## 2016-08-29 DIAGNOSIS — Z79899 Other long term (current) drug therapy: Secondary | ICD-10-CM | POA: Diagnosis not present

## 2016-08-29 DIAGNOSIS — R221 Localized swelling, mass and lump, neck: Secondary | ICD-10-CM | POA: Diagnosis not present

## 2016-08-29 DIAGNOSIS — Z7984 Long term (current) use of oral hypoglycemic drugs: Secondary | ICD-10-CM | POA: Insufficient documentation

## 2016-08-29 DIAGNOSIS — G4730 Sleep apnea, unspecified: Secondary | ICD-10-CM | POA: Insufficient documentation

## 2016-08-29 DIAGNOSIS — Z6837 Body mass index (BMI) 37.0-37.9, adult: Secondary | ICD-10-CM | POA: Insufficient documentation

## 2016-08-29 DIAGNOSIS — E669 Obesity, unspecified: Secondary | ICD-10-CM | POA: Insufficient documentation

## 2016-08-29 DIAGNOSIS — Z7982 Long term (current) use of aspirin: Secondary | ICD-10-CM | POA: Insufficient documentation

## 2016-08-29 DIAGNOSIS — E7800 Pure hypercholesterolemia, unspecified: Secondary | ICD-10-CM | POA: Diagnosis not present

## 2016-08-29 DIAGNOSIS — M542 Cervicalgia: Secondary | ICD-10-CM | POA: Diagnosis not present

## 2016-08-29 HISTORY — PX: EXCISION MASS NECK: SHX6703

## 2016-08-29 LAB — GLUCOSE, CAPILLARY
GLUCOSE-CAPILLARY: 171 mg/dL — AB (ref 65–99)
Glucose-Capillary: 167 mg/dL — ABNORMAL HIGH (ref 65–99)

## 2016-08-29 SURGERY — EXCISION, MASS, NECK
Anesthesia: General | Site: Neck

## 2016-08-29 MED ORDER — 0.9 % SODIUM CHLORIDE (POUR BTL) OPTIME
TOPICAL | Status: DC | PRN
  Administered 2016-08-29: 1000 mL

## 2016-08-29 MED ORDER — OXYCODONE-ACETAMINOPHEN 5-325 MG PO TABS
ORAL_TABLET | ORAL | Status: AC
  Filled 2016-08-29: qty 1

## 2016-08-29 MED ORDER — SUCCINYLCHOLINE CHLORIDE 200 MG/10ML IV SOSY
PREFILLED_SYRINGE | INTRAVENOUS | Status: DC | PRN
  Administered 2016-08-29: 100 mg via INTRAVENOUS

## 2016-08-29 MED ORDER — ONDANSETRON HCL 4 MG/2ML IJ SOLN
INTRAMUSCULAR | Status: DC | PRN
  Administered 2016-08-29: 4 mg via INTRAVENOUS

## 2016-08-29 MED ORDER — DEXAMETHASONE SODIUM PHOSPHATE 4 MG/ML IJ SOLN
INTRAMUSCULAR | Status: DC | PRN
  Administered 2016-08-29: 10 mg via INTRAVENOUS

## 2016-08-29 MED ORDER — LIDOCAINE 2% (20 MG/ML) 5 ML SYRINGE
INTRAMUSCULAR | Status: AC
  Filled 2016-08-29: qty 5

## 2016-08-29 MED ORDER — OXYCODONE-ACETAMINOPHEN 5-325 MG PO TABS
1.0000 | ORAL_TABLET | Freq: Once | ORAL | Status: AC | PRN
  Administered 2016-08-29: 1 via ORAL

## 2016-08-29 MED ORDER — CHLORHEXIDINE GLUCONATE CLOTH 2 % EX PADS: 6.0000 | MEDICATED_PAD | Freq: Once | CUTANEOUS | Status: DC

## 2016-08-29 MED ORDER — FENTANYL CITRATE (PF) 100 MCG/2ML IJ SOLN
INTRAMUSCULAR | Status: AC
  Filled 2016-08-29: qty 2

## 2016-08-29 MED ORDER — SCOPOLAMINE 1 MG/3DAYS TD PT72: 1.0000 | MEDICATED_PATCH | Freq: Once | TRANSDERMAL | Status: DC | PRN

## 2016-08-29 MED ORDER — LIDOCAINE 2% (20 MG/ML) 5 ML SYRINGE
INTRAMUSCULAR | Status: DC | PRN
  Administered 2016-08-29: 60 mg via INTRAVENOUS

## 2016-08-29 MED ORDER — DEXTROSE 5 % IV SOLN
3.0000 g | INTRAVENOUS | Status: AC
  Administered 2016-08-29: 2 g via INTRAVENOUS

## 2016-08-29 MED ORDER — FENTANYL CITRATE (PF) 100 MCG/2ML IJ SOLN: 25.0000 ug | INTRAMUSCULAR | Status: DC | PRN

## 2016-08-29 MED ORDER — CEFAZOLIN SODIUM-DEXTROSE 2-4 GM/100ML-% IV SOLN
INTRAVENOUS | Status: AC
  Filled 2016-08-29: qty 100

## 2016-08-29 MED ORDER — BUPIVACAINE-EPINEPHRINE (PF) 0.5% -1:200000 IJ SOLN
INTRAMUSCULAR | Status: AC
  Filled 2016-08-29: qty 60

## 2016-08-29 MED ORDER — PROPOFOL 10 MG/ML IV BOLUS
INTRAVENOUS | Status: DC | PRN
  Administered 2016-08-29: 150 mg via INTRAVENOUS
  Administered 2016-08-29: 30 mg via INTRAVENOUS
  Administered 2016-08-29: 50 mg via INTRAVENOUS

## 2016-08-29 MED ORDER — PROPOFOL 500 MG/50ML IV EMUL
INTRAVENOUS | Status: AC
  Filled 2016-08-29: qty 50

## 2016-08-29 MED ORDER — FENTANYL CITRATE (PF) 100 MCG/2ML IJ SOLN
50.0000 ug | INTRAMUSCULAR | Status: DC | PRN
  Administered 2016-08-29: 100 ug via INTRAVENOUS

## 2016-08-29 MED ORDER — LACTATED RINGERS IV SOLN
INTRAVENOUS | Status: DC
  Administered 2016-08-29 (×2): via INTRAVENOUS

## 2016-08-29 MED ORDER — ARTIFICIAL TEARS OPHTHALMIC OINT
TOPICAL_OINTMENT | OPHTHALMIC | Status: AC
  Filled 2016-08-29: qty 3.5

## 2016-08-29 MED ORDER — BUPIVACAINE-EPINEPHRINE 0.5% -1:200000 IJ SOLN
INTRAMUSCULAR | Status: DC | PRN
  Administered 2016-08-29: 10 mL

## 2016-08-29 MED ORDER — SUCCINYLCHOLINE CHLORIDE 200 MG/10ML IV SOSY
PREFILLED_SYRINGE | INTRAVENOUS | Status: AC
  Filled 2016-08-29: qty 10

## 2016-08-29 MED ORDER — NAPROXEN 375 MG PO TABS: 375.0000 mg | ORAL_TABLET | Freq: Two times a day (BID) | ORAL | 2 refills | Status: DC

## 2016-08-29 MED ORDER — MIDAZOLAM HCL 2 MG/2ML IJ SOLN
INTRAMUSCULAR | Status: AC
  Filled 2016-08-29: qty 2

## 2016-08-29 MED ORDER — MIDAZOLAM HCL 2 MG/2ML IJ SOLN
1.0000 mg | INTRAMUSCULAR | Status: DC | PRN
  Administered 2016-08-29: 2 mg via INTRAVENOUS

## 2016-08-29 MED ORDER — ONDANSETRON HCL 4 MG/2ML IJ SOLN
INTRAMUSCULAR | Status: AC
  Filled 2016-08-29: qty 2

## 2016-08-29 MED ORDER — DEXAMETHASONE SODIUM PHOSPHATE 10 MG/ML IJ SOLN
INTRAMUSCULAR | Status: AC
  Filled 2016-08-29: qty 1

## 2016-08-29 SURGICAL SUPPLY — 21 items
BLADE SURG 15 STRL LF DISP TIS (BLADE) ×1 IMPLANT
BLADE SURG 15 STRL SS (BLADE) ×2
CHLORAPREP W/TINT 26ML (MISCELLANEOUS) ×3 IMPLANT
COVER MAYO STAND STRL (DRAPES) ×3 IMPLANT
DERMABOND ADVANCED (GAUZE/BANDAGES/DRESSINGS) ×2
DERMABOND ADVANCED .7 DNX12 (GAUZE/BANDAGES/DRESSINGS) ×1 IMPLANT
DRAPE UTILITY XL STRL (DRAPES) ×3 IMPLANT
ELECT COATED BLADE 2.86 ST (ELECTRODE) ×3 IMPLANT
ELECT REM PT RETURN 9FT ADLT (ELECTROSURGICAL) ×3
ELECTRODE REM PT RTRN 9FT ADLT (ELECTROSURGICAL) ×1 IMPLANT
GLOVE BIOGEL PI IND STRL 8 (GLOVE) ×1 IMPLANT
GLOVE BIOGEL PI INDICATOR 8 (GLOVE) ×2
GLOVE ECLIPSE 8.0 STRL XLNG CF (GLOVE) ×3 IMPLANT
NEEDLE HYPO 25X1 1.5 SAFETY (NEEDLE) ×3 IMPLANT
NS IRRIG 1000ML POUR BTL (IV SOLUTION) ×3 IMPLANT
PENCIL BUTTON HOLSTER BLD 10FT (ELECTRODE) ×3 IMPLANT
SPONGE LAP 4X18 X RAY DECT (DISPOSABLE) ×3 IMPLANT
SUT MON AB 4-0 PC3 18 (SUTURE) ×3 IMPLANT
SUT VICRYL 3-0 CR8 SH (SUTURE) ×3 IMPLANT
SYR CONTROL 10ML LL (SYRINGE) ×3 IMPLANT
TOWEL OR 17X24 6PK STRL BLUE (TOWEL DISPOSABLE) ×3 IMPLANT

## 2016-08-29 NOTE — Transfer of Care (Signed)
Immediate Anesthesia Transfer of Care Note  Patient: [name]  Procedure(s) Performed: Procedure(s): EXCISION MASS POSTERIOR NECK (N/A)  Patient Location: PACU  Anesthesia Type:General  Level of Consciousness: awake, patient cooperative and confused  Airway & Oxygen Therapy: Patient Spontanous Breathing and Patient connected to face mask oxygen  Post-op Assessment: Report given to RN and Post -op Vital signs reviewed and stable  Post vital signs: Reviewed and stable  Last Vitals:  Vitals:   08/29/16 0932 08/29/16 0933  BP:    Pulse: 90 86  Resp:  20  Temp:      Last Pain:  Vitals:   08/29/16 0807  TempSrc: Oral         Complications: No apparent anesthesia complications
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
## 2016-08-29 NOTE — Discharge Instructions (Signed)
GENERAL SURGERY: POST OP INSTRUCTIONS ° °###################################################################### ° °EAT °Gradually transition to a high fiber diet with a fiber supplement over the next few weeks after discharge.  Start with a pureed / full liquid diet (see below) ° °WALK °Walk an hour a day.  Control your pain to do that.   ° °CONTROL PAIN °Control pain so that you can walk, sleep, tolerate sneezing/coughing, go up/down stairs. ° °HAVE A BOWEL MOVEMENT DAILY °Keep your bowels regular to avoid problems.  OK to try a laxative to override constipation.  OK to use an antidairrheal to slow down diarrhea.  Call if not better after 2 tries ° °CALL IF YOU HAVE PROBLEMS/CONCERNS °Call if you are still struggling despite following these instructions. °Call if you have concerns not answered by these instructions ° °###################################################################### ° ° ° °1. DIET: Follow a light bland diet the first 24 hours after arrival home, such as soup, liquids, crackers, etc.  Be sure to include lots of fluids daily.  Avoid fast food or heavy meals as your are more likely to get nauseated.   °2. Take your usually prescribed home medications unless otherwise directed. °3. PAIN CONTROL: °a. Pain is best controlled by a usual combination of three different methods TOGETHER: °i. Ice/Heat °ii. Over the counter pain medication °iii. Prescription pain medication °b. Most patients will experience some swelling and bruising around the incisions.  Ice packs or heating pads (30-60 minutes up to 6 times a day) will help. Use ice for the first few days to help decrease swelling and bruising, then switch to heat to help relax tight/sore spots and speed recovery.  Some people prefer to use ice alone, heat alone, alternating between ice & heat.  Experiment to what works for you.  Swelling and bruising can take several weeks to resolve.   °c. It is helpful to take an over-the-counter pain medication  regularly for the first few weeks.  Choose one of the following that works best for you: °i. Naproxen (Aleve, etc)  Two 220mg tabs twice a day °ii. Ibuprofen (Advil, etc) Three 200mg tabs four times a day (every meal & bedtime) °iii. Acetaminophen (Tylenol, etc) 500-650mg four times a day (every meal & bedtime) °d. A  prescription for pain medication (such as oxycodone, hydrocodone, etc) should be given to you upon discharge.  Take your pain medication as prescribed.  °i. If you are having problems/concerns with the prescription medicine (does not control pain, nausea, vomiting, rash, itching, etc), please call us [phone] to see if we need to switch you to a different pain medicine that will work better for you and/or control your side effect better. °ii. If you need a refill on your pain medication, please contact your pharmacy.  They will contact our office to request authorization. Prescriptions will not be filled after 5 pm or on week-ends. °4. Avoid getting constipated.  Between the surgery and the pain medications, it is common to experience some constipation.  Increasing fluid intake and taking a fiber supplement (such as Metamucil, Citrucel, FiberCon, MiraLax, etc) 1-2 times a day regularly will usually help prevent this problem from occurring.  A mild laxative (prune juice, Milk of Magnesia, MiraLax, etc) should be taken according to package directions if there are no bowel movements after 48 hours.   °5. Wash / shower every day.  You may shower over the dressings as they are waterproof.  Continue to shower over incision(s) after the dressing is off. °6. Remove your waterproof bandages   5 days after surgery.  You may leave the incision open to air.  You may have skin tapes (Steri Strips) covering the incision(s).  Leave them on until one week, then remove.  You may replace a dressing/Band-Aid to cover the incision for comfort if you wish.  ° ° ° ° °7. ACTIVITIES as tolerated:   °a. You may resume  regular (light) daily activities beginning the next day--such as daily self-care, walking, climbing stairs--gradually increasing activities as tolerated.  If you can walk 30 minutes without difficulty, it is safe to try more intense activity such as jogging, treadmill, bicycling, low-impact aerobics, swimming, etc. °b. Save the most intensive and strenuous activity for last such as sit-ups, heavy lifting, contact sports, etc  Refrain from any heavy lifting or straining until you are off narcotics for pain control.   °c. DO NOT PUSH THROUGH PAIN.  Let pain be your guide: If it hurts to do something, don't do it.  Pain is your body warning you to avoid that activity for another week until the pain goes down. °d. You may drive when you are no longer taking prescription pain medication, you can comfortably wear a seatbelt, and you can safely maneuver your car and apply brakes. °e. You may have sexual intercourse when it is comfortable.  °8. FOLLOW UP in our office °a. Please call [facility] at [phone] to set up an appointment to see your surgeon in the office for a follow-up appointment approximately 2-3 weeks after your surgery. °b. Make sure that you call for this appointment the day you arrive home to insure a convenient appointment time. °9. IF YOU HAVE DISABILITY OR FAMILY LEAVE FORMS, BRING THEM TO THE OFFICE FOR PROCESSING.  DO NOT GIVE THEM TO YOUR DOCTOR. ° ° °WHEN TO CALL US [phone]: °1. Poor pain control °2. Reactions / problems with new medications (rash/itching, nausea, etc)  °3. Fever over 101.5 F (38.5 C) °4. Worsening swelling or bruising °5. Continued bleeding from incision. °6. Increased pain, redness, or drainage from the incision °7. Difficulty breathing / swallowing ° ° The clinic staff is available to answer your questions during regular business hours (8:30am-5pm).  Please don’t hesitate to call and ask to speak to one of our nurses for clinical concerns.  ° If you have a medical emergency,  go to the nearest emergency room or call 911. ° A surgeon from [facility] is always on call at the hospitals ° ° °[facility] °[address], [address], [place], [place]  [address] ? °MAIN: [phone] ? TOLL FREE: 1-800-359-8415 ?  °FAX [phone] °[email or web address] ° ° °Post Anesthesia Home Care Instructions ° °Activity: °Get plenty of rest for the remainder of the day. A responsible individual must stay with you for 24 hours following the procedure.  °For the next 24 hours, DO NOT: °-Drive a car °-Operate machinery °-Drink alcoholic beverages °-Take any medication unless instructed by your physician °-Make any legal decisions or sign important papers. ° °Meals: °Start with liquid foods such as gelatin or soup. Progress to regular foods as tolerated. Avoid greasy, spicy, heavy foods. If nausea and/or vomiting occur, drink only clear liquids until the nausea and/or vomiting subsides. Call your physician if vomiting continues. ° °Special Instructions/Symptoms: °Your throat may feel dry or sore from the anesthesia or the breathing tube placed in your throat during surgery. If this causes discomfort, gargle with warm salt water. The discomfort should disappear within 24 hours. ° °  If you had a scopolamine patch placed behind your ear for the management of post- operative nausea and/or vomiting: ° °1. The medication in the patch is effective for 72 hours, after which it should be removed.  Wrap patch in a tissue and discard in the trash. Wash hands thoroughly with soap and water. °2. You may remove the patch earlier than 72 hours if you experience unpleasant side effects which may include dry mouth, dizziness or visual disturbances. °3. Avoid touching the patch. Wash your hands with soap and water after contact with the patch. °  ° °
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
## 2016-08-29 NOTE — Op Note (Signed)
Preoperative diagnosis: 4 cm subcutaneous lipoma posterior neck  Postoperative diagnosis: Same  Procedure: Excision of 4 cm subcutaneous lipoma posterior neck  Surgeon: Erroll Luna M.D.  Anesthesia: Gen. with 0.25% Sensorcaine local  EBL: Minimal  Specimen: Fatty mass consistent with lipoma from posterior neck to pathology  Drains: None  Indications for procedure: The patient's a 66 year old male with a slowly growing mass over his posterior neck. He has been present for many years and is slowly increasing in size. Is causing discomfort he desires excision. Risks, benefits and alternatives to surgery were discussed with the patient. Long-term expectations and recovery timeframe discussed with patient.The procedure has been discussed with the patient.  Alternative therapies have been discussed with the patient.  Operative risks include bleeding,  Infection,  Organ injury,  Nerve injury,  Blood vessel injury,  DVT,  Pulmonary embolism,  Death,  And possible reoperation.  Medical management risks include worsening of present situation.  The success of the procedure is 50 -90 % at treating patients symptoms.  The patient understands and agrees to proceed.   Description of procedure:  The patient was met in the holding area and the mass on his posterior neck was reexamined and located. It was about 4 cm as as indicated on my history and physical. It was mobile. Questions are answered. The patient was taken back to the operating room and placed supine on the stretcher. He was initially placed on the operating table with a beanbag and placed with his right side down. Unfortunately I cannot easily palpate the mass and felt that the patient needed to be present. We then repositioned the patient prone on the operating room table after successful intubation. The mass that was palpable and marked with the pin prior to prepping and draping. The area was prepped and draped in a sterile fashion. Timeout was  done to verify proper patient procedure. A transverse incision was made over the marking with the patient was marked. Dissection was carried down and he had a very thick neck. The mass was identified and corresponded to what I felt on physical examination was removed. This measured about 3-1/2-4 cm it was fatty in nature consistent with lipoma. I palpated the subcutaneous tissues throughout the area and felt no other masses. We was irrigated and made hemostatic and closed with 3-0 Vicryl and 4-0 Monocryl. All final counts are found to be correct. Patient was then placed supine extubated taken to recovery in satisfactory condition after application of Dermabond.

## 2016-08-29 NOTE — Anesthesia Procedure Notes (Signed)
Procedure Name: Intubation Date/Time: 08/29/2016 8:34 AM Performed by: Lyndee Leo Pre-anesthesia Checklist: Patient identified, Emergency Drugs available, Suction available and Patient being monitored Patient Re-evaluated:Patient Re-evaluated prior to inductionOxygen Delivery Method: Circle system utilized Preoxygenation: Pre-oxygenation with 100% oxygen Intubation Type: IV induction Ventilation: Mask ventilation without difficulty Laryngoscope Size: Miller and 3 Grade View: Grade III Tube type: Oral Tube size: 8.0 mm Number of attempts: 1 Airway Equipment and Method: Stylet and Oral airway Placement Confirmation: ETT inserted through vocal cords under direct vision,  positive ETCO2 and breath sounds checked- equal and bilateral Tube secured with: Tape Dental Injury: Teeth and Oropharynx as per pre-operative assessment

## 2016-08-29 NOTE — Anesthesia Postprocedure Evaluation (Signed)
Anesthesia Post Note  Patient: [name]  Procedure(s) Performed: Procedure(s) (LRB): EXCISION MASS POSTERIOR NECK (N/A)  Patient location during evaluation: PACU Anesthesia Type: General Level of consciousness: awake and alert Pain management: pain level controlled Vital Signs Assessment: post-procedure vital signs reviewed and stable Respiratory status: spontaneous breathing, nonlabored ventilation and respiratory function stable Cardiovascular status: blood pressure returned to baseline and stable Postop Assessment: no signs of nausea or vomiting Anesthetic complications: no       Last Vitals:  Vitals:   08/29/16 0933 08/29/16 1015  BP:  (!) (P) 150/80  Pulse: 86 (P) 72  Resp: 20 (P) 18  Temp:  (P) 37 C    Last Pain:  Vitals:   08/29/16 1015  TempSrc:   PainSc: 5                  Aubrey Blackard,W. EDMOND

## 2016-08-29 NOTE — Interval H&P Note (Signed)
History and Physical Interval Note:  08/29/2016 8:10 AM  [name]  has presented today for surgery, with the diagnosis of Mass posterior neck  The various methods of treatment have been discussed with the patient and family. After consideration of risks, benefits and other options for treatment, the patient has consented to  Procedure(s): EXCISION MASS POSTERIOR NECK (N/A) as a surgical intervention .  The patient's history has been reviewed, patient examined, no change in status, stable for surgery.  I have reviewed the patient's chart and labs.  Questions were answered to the patient's satisfaction.     [name]

## 2016-08-30 ENCOUNTER — Encounter (HOSPITAL_BASED_OUTPATIENT_CLINIC_OR_DEPARTMENT_OTHER): Payer: Self-pay | Admitting: Surgery

## 2016-09-11 DIAGNOSIS — N402 Nodular prostate without lower urinary tract symptoms: Secondary | ICD-10-CM | POA: Diagnosis not present

## 2016-09-11 DIAGNOSIS — C61 Malignant neoplasm of prostate: Secondary | ICD-10-CM | POA: Diagnosis not present

## 2016-09-24 DIAGNOSIS — G894 Chronic pain syndrome: Secondary | ICD-10-CM | POA: Diagnosis not present

## 2016-09-24 DIAGNOSIS — M961 Postlaminectomy syndrome, not elsewhere classified: Secondary | ICD-10-CM | POA: Diagnosis not present

## 2016-11-08 ENCOUNTER — Encounter: Payer: Self-pay | Admitting: Internal Medicine

## 2016-12-04 DIAGNOSIS — M961 Postlaminectomy syndrome, not elsewhere classified: Secondary | ICD-10-CM | POA: Diagnosis not present

## 2016-12-17 DIAGNOSIS — M961 Postlaminectomy syndrome, not elsewhere classified: Secondary | ICD-10-CM | POA: Diagnosis not present

## 2016-12-25 DIAGNOSIS — M961 Postlaminectomy syndrome, not elsewhere classified: Secondary | ICD-10-CM | POA: Diagnosis not present

## 2016-12-25 DIAGNOSIS — S335XXA Sprain of ligaments of lumbar spine, initial encounter: Secondary | ICD-10-CM | POA: Diagnosis not present

## 2016-12-25 DIAGNOSIS — S139XXD Sprain of joints and ligaments of unspecified parts of neck, subsequent encounter: Secondary | ICD-10-CM | POA: Diagnosis not present

## 2017-01-23 ENCOUNTER — Ambulatory Visit: Payer: Medicare Other | Admitting: Internal Medicine

## 2017-02-20 ENCOUNTER — Other Ambulatory Visit: Payer: Self-pay | Admitting: Family Medicine

## 2017-02-20 DIAGNOSIS — E7800 Pure hypercholesterolemia, unspecified: Secondary | ICD-10-CM

## 2017-02-22 ENCOUNTER — Other Ambulatory Visit: Payer: Self-pay | Admitting: Family Medicine

## 2017-03-04 ENCOUNTER — Ambulatory Visit (INDEPENDENT_AMBULATORY_CARE_PROVIDER_SITE_OTHER): Payer: Medicare Other

## 2017-03-04 VITALS — BP 150/90 | HR 71 | Temp 98.1°F | Ht 67.5 in | Wt 241.2 lb

## 2017-03-04 DIAGNOSIS — Z0000 Encounter for general adult medical examination without abnormal findings: Secondary | ICD-10-CM

## 2017-03-04 DIAGNOSIS — Z23 Encounter for immunization: Secondary | ICD-10-CM

## 2017-03-04 DIAGNOSIS — E1165 Type 2 diabetes mellitus with hyperglycemia: Secondary | ICD-10-CM

## 2017-03-04 DIAGNOSIS — I10 Essential (primary) hypertension: Secondary | ICD-10-CM

## 2017-03-04 DIAGNOSIS — C61 Malignant neoplasm of prostate: Secondary | ICD-10-CM | POA: Diagnosis not present

## 2017-03-04 DIAGNOSIS — E7800 Pure hypercholesterolemia, unspecified: Secondary | ICD-10-CM

## 2017-03-04 DIAGNOSIS — E118 Type 2 diabetes mellitus with unspecified complications: Secondary | ICD-10-CM | POA: Diagnosis not present

## 2017-03-04 DIAGNOSIS — IMO0002 Reserved for concepts with insufficient information to code with codable children: Secondary | ICD-10-CM

## 2017-03-04 LAB — CBC WITH DIFFERENTIAL/PLATELET
BASOS PCT: 0.3 % (ref 0.0–3.0)
Basophils Absolute: 0 10*3/uL (ref 0.0–0.1)
EOS ABS: 0.1 10*3/uL (ref 0.0–0.7)
EOS PCT: 1.3 % (ref 0.0–5.0)
HCT: 42.3 % (ref 39.0–52.0)
Hemoglobin: 13.8 g/dL (ref 13.0–17.0)
LYMPHS ABS: 1.8 10*3/uL (ref 0.7–4.0)
Lymphocytes Relative: 29 % (ref 12.0–46.0)
MCHC: 32.6 g/dL (ref 30.0–36.0)
MCV: 92 fl (ref 78.0–100.0)
MONO ABS: 0.7 10*3/uL (ref 0.1–1.0)
Monocytes Relative: 11.6 % (ref 3.0–12.0)
NEUTROS PCT: 57.8 % (ref 43.0–77.0)
Neutro Abs: 3.6 10*3/uL (ref 1.4–7.7)
Platelets: 268 10*3/uL (ref 150.0–400.0)
RBC: 4.6 Mil/uL (ref 4.22–5.81)
RDW: 14 % (ref 11.5–15.5)
WBC: 6.2 10*3/uL (ref 4.0–10.5)

## 2017-03-04 LAB — COMPREHENSIVE METABOLIC PANEL
ALT: 16 U/L (ref 0–53)
AST: 10 U/L (ref 0–37)
Albumin: 4.3 g/dL (ref 3.5–5.2)
Alkaline Phosphatase: 64 U/L (ref 39–117)
BUN: 12 mg/dL (ref 6–23)
CHLORIDE: 101 meq/L (ref 96–112)
CO2: 29 mEq/L (ref 19–32)
Calcium: 9.8 mg/dL (ref 8.4–10.5)
Creatinine, Ser: 0.87 mg/dL (ref 0.40–1.50)
GFR: 112.55 mL/min (ref >60.00)
GLUCOSE: 235 mg/dL — AB (ref 70–99)
POTASSIUM: 4.5 meq/L (ref 3.5–5.1)
SODIUM: 140 meq/L (ref 135–145)
Total Bilirubin: 0.3 mg/dL (ref 0.2–1.2)
Total Protein: 7.4 g/dL (ref 6.0–8.3)

## 2017-03-04 LAB — HEMOGLOBIN A1C: HEMOGLOBIN A1C: 8.7 % — AB (ref 4.6–6.5)

## 2017-03-04 LAB — LIPID PANEL
CHOL/HDL RATIO: 4
Cholesterol: 148 mg/dL (ref 0–200)
HDL: 35.2 mg/dL — ABNORMAL LOW (ref >39.00)
LDL CALC: 93 mg/dL (ref 0–99)
NONHDL: 112.54
Triglycerides: 100 mg/dL (ref 0.0–149.0)
VLDL: 20 mg/dL (ref 0.0–40.0)

## 2017-03-04 LAB — PSA, MEDICARE: PSA: 2.24 ng/ml (ref 0.10–4.00)

## 2017-03-04 NOTE — Patient Instructions (Addendum)
Mr. Mckelvie , Thank you for taking time to come for your Medicare Wellness Visit. I appreciate your ongoing commitment to your health goals. Please review the following plan we discussed and let me know if I can assist you in the future.   These are the goals we discussed: Goals    Starting 03/04/2017, I will continue to drink at least 6-8 glasses of water daily.       This is a list of the screening recommended for you and due dates:  Health Maintenance  Topic Date Due  . Complete foot exam   03/11/2017*  . Pneumonia vaccines (2 of 2 - PPSV23) 06/16/2017  . Hemoglobin A1C  09/01/2017  . Eye exam for diabetics  01/21/2018  . Colon Cancer Screening  10/10/2021  . DTaP/Tdap/Td vaccine (2 - Td) 01/27/2024  . Tetanus Vaccine  01/27/2024  . Flu Shot  Completed  .  Hepatitis C: One time screening is recommended by Center for Disease Control  (CDC) for  adults born from [birth year] through 1966.   Completed  *Topic was postponed. The date shown is not the original due date.   Preventive Care for Adults  A healthy lifestyle and preventive care can promote health and wellness. Preventive health guidelines for adults include the following key practices.  . A routine yearly physical is a good way to check with your health care provider about your health and preventive screening. It is a chance to share any concerns and updates on your health and to receive a thorough exam.  . Visit your dentist for a routine exam and preventive care every 6 months. Brush your teeth twice a day and floss once a day. Good oral hygiene prevents tooth decay and gum disease.  . The frequency of eye exams is based on your age, health, family medical history, use  of contact lenses, and other factors. Follow your health care provider's recommendations for frequency of eye exams.  . Eat a healthy diet. Foods like vegetables, fruits, whole grains, low-fat dairy products, and lean protein foods contain the nutrients you need  without too many calories. Decrease your intake of foods high in solid fats, added sugars, and salt. Eat the right amount of calories for you. Get information about a proper diet from your health care provider, if necessary.  . Regular physical exercise is one of the most important things you can do for your health. Most adults should get at least 150 minutes of moderate-intensity exercise (any activity that increases your heart rate and causes you to sweat) each week. In addition, most adults need muscle-strengthening exercises on 2 or more days a week.  Silver Sneakers may be a benefit available to you. To determine eligibility, you may visit the website: www.silversneakers.com or contact program at (678)339-3974 Mon-Fri between 8AM-8PM.   . Maintain a healthy weight. The body mass index (BMI) is a screening tool to identify possible weight problems. It provides an estimate of body fat based on height and weight. Your health care provider can find your BMI and can help you achieve or maintain a healthy weight.   For adults 20 years and older: ? A BMI below 18.5 is considered underweight. ? A BMI of 18.5 to 24.9 is normal. ? A BMI of 25 to 29.9 is considered overweight. ? A BMI of 30 and above is considered obese.   . Maintain normal blood lipids and cholesterol levels by exercising and minimizing your intake of saturated fat. Eat a balanced  diet with plenty of fruit and vegetables. Blood tests for lipids and cholesterol should begin at age 76 and be repeated every 5 years. If your lipid or cholesterol levels are high, you are over 50, or you are at high risk for heart disease, you may need your cholesterol levels checked more frequently. Ongoing high lipid and cholesterol levels should be treated with medicines if diet and exercise are not working.  . If you smoke, find out from your health care provider how to quit. If you do not use tobacco, please do not start.  . If you choose to drink  alcohol, please do not consume more than 2 drinks per day. One drink is considered to be 12 ounces (355 mL) of beer, 5 ounces (148 mL) of wine, or 1.5 ounces (44 mL) of liquor.  . If you are 52-72 years old, ask your health care provider if you should take aspirin to prevent strokes.  . Use sunscreen. Apply sunscreen liberally and repeatedly throughout the day. You should seek shade when your shadow is shorter than you. Protect yourself by wearing long sleeves, pants, a wide-brimmed hat, and sunglasses year round, whenever you are outdoors.  . Once a month, do a whole body skin exam, using a mirror to look at the skin on your back. Tell your health care provider of new moles, moles that have irregular borders, moles that are larger than a pencil eraser, or moles that have changed in shape or color.

## 2017-03-04 NOTE — Progress Notes (Signed)
PCP notes:   Health maintenance:  Foot exam - PCP please address at appt A1C - completed Flu vaccine - administered  Abnormal screenings:   Hearing - failed  Hearing Screening   125Hz  250Hz  500Hz  1000Hz  2000Hz  3000Hz  4000Hz  6000Hz  8000Hz   Right ear:   40 40 40  40    Left ear:   40 40 40  40      Patient concerns:   None  Nurse concerns:  None  Next PCP appt:   03/11/17 @ 0830  I reviewed health advisor's note, was available for consultation on the day of service listed in this note, and agree with documentation and plan. Elsie Stain, MD.

## 2017-03-04 NOTE — Progress Notes (Signed)
Pre visit review using our clinic review tool, if applicable. No additional management support is needed unless otherwise documented below in the visit note. 

## 2017-03-04 NOTE — Progress Notes (Signed)
Subjective:   [name] is a 66 y.o. male who presents for Medicare Annual/Subsequent preventive examination.  Review of Systems:  N/A Cardiac Risk Factors include: advanced age (>53men, >78 women);obesity (BMI >30kg/m2);diabetes mellitus;dyslipidemia;hypertension;male gender     Objective:    Vitals: BP (!) 150/90 (BP Location: Right Arm, Patient Position: Sitting, Cuff Size: Normal)   Pulse 71   Temp 98.1 F (36.7 C) (Oral)   Ht 5' 7.5" (1.715 m) Comment: no shoes  Wt 241 lb 4 oz (109.4 kg)   SpO2 95%   BMI 37.23 kg/m   Body mass index is 37.23 kg/m.  Tobacco Social History   Tobacco Use  Smoking Status Never Smoker  Smokeless Tobacco Never Used     Counseling given: No   Past Medical History:  Diagnosis Date  . Abdominal wall hernia   . Anxiety   . Back pain    with radiculopathy  . Bladder wall thickening 2013   nl rpt CT scan, normal cytology and UCx, released from urology Karsten Ro)  . Chest pain, non-cardiac 2008   adenosine myoview low risk.  EF 60%.  normal cath 2008, deemed atypical /noncardiac, ?discogenic  . Chronic neck pain   . Chronic pain    narcotic dependent ([place])  . Circadian rhythm sleep disorder, irregular sleep-wake type   . Complication of anesthesia    hard to put patient to sleep  . Depression   . Diverticulosis 2013   by CT scan  . Esophageal stricture 06/2007   dilation of stricture  . GERD (gastroesophageal reflux disease)   . Gross hematuria 2013, 2016   negative CT, cystoscopy, cytology, 2016 - thought from E coli UTI s/p appropriate treatment ([place])  . History of cocaine use 2004  . History of epididymitis   . History of prostatitis 08/2011   ?chronic/[place], treated with 1 mo cipro  . Hyperlipidemia   . Hypertension    Essential  . OSA (obstructive sleep apnea) 2010   mild off CPAP  . Postlaminectomy syndrome of cervical region    s/p ACDF C5-7 2010 ([place])  . Prostate cancer ([place]) 06/16/2012   T2a,  Gleason 6 prostate cancer found by R apical nodule with PSA 2.18 Karsten Ro, [place]) 04/2016  . T2DM (type 2 diabetes mellitus) ([place])   . TIA (transient ischemic attack)    cocaine related   Past Surgical History:  Procedure Laterality Date  . ANTERIOR CERVICAL DISCECTOMY  10/28/2008   with fusion (Dr. Rolena Infante)  . CARDIAC CATHETERIZATION  2008   WNL   . CARPAL TUNNEL RELEASE     Right  . CATARACT EXTRACTION Bilateral 12/2014, 01/2015   Dr Tommy Rainwater  . DIRECT LARYNGOSCOPY  11/02/2008   Extubation under anesthesia (Dr. Erik Obey)  . ESOPHAGOGASTRODUODENOSCOPY  07/17/07   Esoph. Stricture dilated (Dr. Henrene Pastor)  . HEMATOMA EVACUATION  10/29/2008   (Dr. Rolena Infante)  . INGUINAL HERNIA REPAIR     bilateral, 5 cm. RLQ Abd Lipoma Excision (Dr. Lucia Gaskins)  . LIPOMA EXCISION  08/2016   posterior neck  . SHOULDER SURGERY Right 2013   replaced  . TONSILLECTOMY  childhood  . TRIGGER FINGER RELEASE     Right long finger   Family History  Problem Relation Age of Onset  . Diabetes Mother   . Hypertension Mother   . Hyperlipidemia Mother   . Diabetes Father   . Diabetes Sister   . Hypertension Sister   . Hypertension Brother   . Diabetes Brother   . Diabetes  Brother   . Hypertension Brother   . Diabetes Brother   . Heart disease Maternal Uncle        Heart failure  . Cancer Paternal Uncle        Prostate  . Cancer Paternal Grandfather        Prostate  . Stroke Neg Hx    Social History   Substance and Sexual Activity  Sexual Activity No    Outpatient Encounter Medications as of 03/04/2017  Medication Sig  . aspirin EC 81 MG tablet Take 81 mg by mouth daily.  Marland Kitchen atorvastatin (LIPITOR) 10 MG tablet TAKE 1 TABLET BY MOUTH  DAILY  . baclofen (LIORESAL) 10 MG tablet TAKE 1 TABLET BY MOUTH AT  BEDTIME AS NEEDED FOR BACK  SPASMS  . fluticasone (FLONASE) 50 MCG/ACT nasal spray Place 2 sprays into both nostrils daily.  Marland Kitchen gabapentin (NEURONTIN) 300 MG capsule TAKE 1 CAPSULE BY MOUTH TWO TIMES DAILY    . glipiZIDE (GLUCOTROL XL) 10 MG 24 hr tablet TAKE 1 TABLET BY MOUTH  DAILY WITH BREAKFAST  . glucose blood (ONE TOUCH ULTRA TEST) test strip Use to check sugar twice daily. Dx: E11.65; E11.8  . losartan (COZAAR) 25 MG tablet TAKE 1 TABLET BY MOUTH  DAILY  . metFORMIN (GLUCOPHAGE) 850 MG tablet TAKE 1 TABLET BY MOUTH TWO  TIMES DAILY WITH MEALS  . naproxen (NAPROSYN) 375 MG tablet Take 1 tablet (375 mg total) by mouth 2 (two) times daily with a meal.  . omeprazole (PRILOSEC) 40 MG capsule TAKE 1 CAPSULE BY MOUTH  DAILY  . oxyCODONE-acetaminophen (PERCOCET) 10-325 MG per tablet Take 1-2 tablets by mouth every 4 (four) hours as needed for pain.  . sildenafil (VIAGRA) 100 MG tablet Take 0.5-1 tablets (50-100 mg total) by mouth daily as needed for erectile dysfunction.  . tamsulosin (FLOMAX) 0.4 MG CAPS capsule Take 1 capsule (0.4 mg total) by mouth daily after supper.   No facility-administered encounter medications on file as of 03/04/2017.     Activities of Daily Living In your present state of health, do you have any difficulty performing the following activities: 03/04/2017 08/29/2016  Hearing? N N  Vision? N N  Difficulty concentrating or making decisions? Y N  Walking or climbing stairs? N N  Dressing or bathing? N N  Doing errands, shopping? N -  Preparing Food and eating ? N -  Using the Toilet? N -  In the past six months, have you accidently leaked urine? N -  Do you have problems with loss of bowel control? N -  Managing your Medications? N -  Managing your Finances? N -  Housekeeping or managing your Housekeeping? N -  Some recent data might be hidden    Patient Care Team: Ria Bush, MD as PCP - General (Family Medicine) Tonia Ghent, MD (Family Medicine) Shirley Muscat Loreen Freud, MD as Referring Physician (Optometry) Netta Cedars, MD as Consulting Physician (Orthopedic Surgery) Melina Schools, MD as Consulting Physician (Orthopedic Surgery)   Assessment:      Hearing Screening   125Hz  250Hz  500Hz  1000Hz  2000Hz  3000Hz  4000Hz  6000Hz  8000Hz   Right ear:   40 40 40  40    Left ear:   40 40 40  40    Vision Screening Comments: Last vision exam in Oct 2018   Exercise Activities and Dietary recommendations Current Exercise Habits: The patient does not participate in regular exercise at present, Exercise limited by: orthopedic condition(s)  Goals    Starting  03/04/2017, I will continue to drink at least 6-8 glasses of water daily.      Fall Risk Fall Risk  03/04/2017 02/02/2016 01/31/2015 01/26/2014 06/16/2012  Falls in the past year? No No No No No   Depression Screen PHQ 2/9 Scores 03/04/2017 02/02/2016 01/31/2015 01/26/2014  PHQ - 2 Score 0 0 0 0  PHQ- 9 Score 0 - - -    Cognitive Function MMSE - Mini Mental State Exam 03/04/2017 02/02/2016  Orientation to time 5 5  Orientation to Place 5 5  Registration 3 3  Attention/ Calculation 0 0  Recall 3 3  Language- name 2 objects 0 0  Language- repeat 1 1  Language- follow 3 step command 3 3  Language- read & follow direction 0 0  Write a sentence 0 0  Copy design 0 0  Total score 20 20     PLEASE NOTE: A Mini-Cog screen was completed. Maximum score is 20. A value of 0 denotes this part of [place]stein MMSE was not completed or the patient failed this part of the Mini-Cog screening.   Mini-Cog Screening Orientation to Time - Max 5 pts Orientation to Place - Max 5 pts Registration - Max 3 pts Recall - Max 3 pts Language Repeat - Max 1 pts Language Follow 3 Step Command - Max 3 pts   Immunization History  Administered Date(s) Administered  . Influenza Split 02/06/2011, 01/08/2012  . Influenza Whole 03/19/2007, 02/19/2008, 01/27/2009, 01/25/2010  . Influenza,inj,Quad PF,6+ Mos 02/20/2013, 01/26/2014, 01/31/2015, 02/02/2016, 03/04/2017  . Pneumococcal Conjugate-13 08/01/2015  . Pneumococcal Polysaccharide-23 06/16/2012  . Td 04/24/2003  . Tdap 01/26/2014  . Zoster 12/15/2010    Screening Tests Health Maintenance  Topic Date Due  . FOOT EXAM  03/11/2017 (Originally 07/31/2016)  . PNA vac Low Risk Adult (2 of 2 - PPSV23) 06/16/2017  . HEMOGLOBIN A1C  09/01/2017  . OPHTHALMOLOGY EXAM  01/21/2018  . COLONOSCOPY  10/10/2021  . DTaP/Tdap/Td (2 - Td) 01/27/2024  . TETANUS/TDAP  01/27/2024  . INFLUENZA VACCINE  Completed  . Hepatitis C Screening  Completed      Plan:     I have personally reviewed, addressed, and noted the following in the patient's chart:  A. Medical and social history B. Use of alcohol, tobacco or illicit drugs  C. Current medications and supplements D. Functional ability and status E.  Nutritional status F.  Physical activity G. Advance directives H. List of other physicians I.  Hospitalizations, surgeries, and ER visits in previous 12 months J.  [place] to include hearing, vision, cognitive, depression L. Referrals and appointments - none  In addition, I have reviewed and discussed with patient certain preventive protocols, quality metrics, and best practice recommendations. A written personalized care plan for preventive services as well as general preventive health recommendations were provided to patient.  See attached scanned questionnaire for additional information.   Signed,   Lindell Noe, MHA, BS, LPN Health Coach

## 2017-03-11 ENCOUNTER — Ambulatory Visit (INDEPENDENT_AMBULATORY_CARE_PROVIDER_SITE_OTHER): Payer: Medicare Other | Admitting: Family Medicine

## 2017-03-11 ENCOUNTER — Encounter: Payer: Self-pay | Admitting: Family Medicine

## 2017-03-11 VITALS — BP 152/80 | HR 90 | Temp 98.0°F | Ht 68.0 in | Wt 243.0 lb

## 2017-03-11 DIAGNOSIS — C61 Malignant neoplasm of prostate: Secondary | ICD-10-CM

## 2017-03-11 DIAGNOSIS — Z0000 Encounter for general adult medical examination without abnormal findings: Secondary | ICD-10-CM

## 2017-03-11 DIAGNOSIS — I10 Essential (primary) hypertension: Secondary | ICD-10-CM

## 2017-03-11 DIAGNOSIS — D170 Benign lipomatous neoplasm of skin and subcutaneous tissue of head, face and neck: Secondary | ICD-10-CM

## 2017-03-11 DIAGNOSIS — IMO0002 Reserved for concepts with insufficient information to code with codable children: Secondary | ICD-10-CM

## 2017-03-11 DIAGNOSIS — E1142 Type 2 diabetes mellitus with diabetic polyneuropathy: Secondary | ICD-10-CM

## 2017-03-11 DIAGNOSIS — E1165 Type 2 diabetes mellitus with hyperglycemia: Secondary | ICD-10-CM

## 2017-03-11 DIAGNOSIS — Z7189 Other specified counseling: Secondary | ICD-10-CM | POA: Diagnosis not present

## 2017-03-11 DIAGNOSIS — E118 Type 2 diabetes mellitus with unspecified complications: Secondary | ICD-10-CM | POA: Diagnosis not present

## 2017-03-11 DIAGNOSIS — E7800 Pure hypercholesterolemia, unspecified: Secondary | ICD-10-CM

## 2017-03-11 MED ORDER — LOSARTAN POTASSIUM 50 MG PO TABS: 50.0000 mg | ORAL_TABLET | Freq: Every day | ORAL | 3 refills | Status: DC

## 2017-03-11 MED ORDER — METFORMIN HCL 850 MG PO TABS: ORAL_TABLET | ORAL | 3 refills | Status: DC
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
## 2017-03-11 NOTE — Assessment & Plan Note (Signed)
Chronic, deteriorated - will increase losartan to 50mg  daily.

## 2017-03-11 NOTE — Assessment & Plan Note (Addendum)
Advanced directive - has daughter as HCPOA. Has discussed this with daughter. Does not have advanced directive. has discussed with daughter. Advised to bring [place] a copy.

## 2017-03-11 NOTE — Patient Instructions (Addendum)
If interested, check with pharmacy about new 2 shot shingles series (shingrix).  Increase losartan to 50mg  daily and metformin to 10000mg  twice daily - new doses at pharmacy.  Good to see you today. You are doing well today.  Return in 4 months for follow up visit.   Health Maintenance, Male A healthy lifestyle and preventive care is important for your health and wellness. Ask your health care provider about what schedule of regular examinations is right for you. What should I know about weight and diet? Eat a Healthy Diet  Eat plenty of vegetables, fruits, whole grains, low-fat dairy products, and lean protein.  Do not eat a lot of foods high in solid fats, added sugars, or salt.  Maintain a Healthy Weight Regular exercise can help you achieve or maintain a healthy weight. You should:  Do at least 150 minutes of exercise each week. The exercise should increase your heart rate and make you sweat (moderate-intensity exercise).  Do strength-training exercises at least twice a week.  Watch Your Levels of Cholesterol and Blood Lipids  Have your blood tested for lipids and cholesterol every 5 years starting at 67 years of age. If you are at high risk for heart disease, you should start having your blood tested when you are 67 years old. You may need to have your cholesterol levels checked more often if: ? Your lipid or cholesterol levels are high. ? You are older than 67 years of age. ? You are at high risk for heart disease.  What should I know about cancer screening? Many types of cancers can be detected early and may often be prevented. Lung Cancer  You should be screened every year for lung cancer if: ? You are a current smoker who has smoked for at least 30 years. ? You are a former smoker who has quit within the past 15 years.  Talk to your health care provider about your screening options, when you should start screening, and how often you should be screened.  Colorectal  Cancer  Routine colorectal cancer screening usually begins at 67 years of age and should be repeated every 5-10 years until you are 67 years old. You may need to be screened more often if early forms of precancerous polyps or small growths are found. Your health care provider may recommend screening at an earlier age if you have risk factors for colon cancer.  Your health care provider may recommend using home test kits to check for hidden blood in the stool.  A small camera at the end of a tube can be used to examine your colon (sigmoidoscopy or colonoscopy). This checks for the earliest forms of colorectal cancer.  Prostate and Testicular Cancer  Depending on your age and overall health, your health care provider may do certain tests to screen for prostate and testicular cancer.  Talk to your health care provider about any symptoms or concerns you have about testicular or prostate cancer.  Skin Cancer  Check your skin from head to toe regularly.  Tell your health care provider about any new moles or changes in moles, especially if: ? There is a change in a mole's size, shape, or color. ? You have a mole that is larger than a pencil eraser.  Always use sunscreen. Apply sunscreen liberally and repeat throughout the day.  Protect yourself by wearing long sleeves, pants, a wide-brimmed hat, and sunglasses when outside.  What should I know about heart disease, diabetes, and high blood pressure?  If you are 67-58 years of age, have your blood pressure checked every 3-5 years. If you are 21 years of age or older, have your blood pressure checked every year. You should have your blood pressure measured twice-once when you are at a hospital or clinic, and once when you are not at a hospital or clinic. Record the average of the two measurements. To check your blood pressure when you are not at a hospital or clinic, you can use: ? An automated blood pressure machine at a pharmacy. ? A home blood  pressure monitor.  Talk to your health care provider about your target blood pressure.  If you are between 13-53 years old, ask your health care provider if you should take aspirin to prevent heart disease.  Have regular diabetes screenings by checking your fasting blood sugar level. ? If you are at a normal weight and have a low risk for diabetes, have this test once every three years after the age of 53. ? If you are overweight and have a high risk for diabetes, consider being tested at a younger age or more often.  A one-time screening for abdominal aortic aneurysm (AAA) by ultrasound is recommended for men aged 36-75 years who are current or former smokers. What should I know about preventing infection? Hepatitis B If you have a higher risk for hepatitis B, you should be screened for this virus. Talk with your health care provider to find out if you are at risk for hepatitis B infection. Hepatitis C Blood testing is recommended for:  Everyone born from [birth year] through 1966.  Anyone with known risk factors for hepatitis C.  Sexually Transmitted Diseases (STDs)  You should be screened each year for STDs including gonorrhea and chlamydia if: ? You are sexually active and are younger than 67 years of age. ? You are older than 67 years of age and your health care provider tells you that you are at risk for this type of infection. ? Your sexual activity has changed since you were last screened and you are at an increased risk for chlamydia or gonorrhea. Ask your health care provider if you are at risk.  Talk with your health care provider about whether you are at high risk of being infected with HIV. Your health care provider may recommend a prescription medicine to help prevent HIV infection.  What else can I do?  Schedule regular health, dental, and eye exams.  Stay current with your vaccines (immunizations).  Do not use any tobacco products, such as cigarettes, chewing tobacco, and  e-cigarettes. If you need help quitting, ask your health care provider.  Limit alcohol intake to no more than 2 drinks per day. One drink equals 12 ounces of beer, 5 ounces of wine, or 1 ounces of hard liquor.  Do not use street drugs.  Do not share needles.  Ask your health care provider for help if you need support or information about quitting drugs.  Tell your health care provider if you often feel depressed.  Tell your health care provider if you have ever been abused or do not feel safe at home. This information is not intended to replace advice given to you by your health care provider. Make sure you discuss any questions you have with your health care provider. Document Released: 10/06/2007 Document Revised: 12/07/2015 Document Reviewed: 01/11/2015 Elsevier Interactive Patient Education  [employer].

## 2017-03-11 NOTE — Assessment & Plan Note (Signed)
Appreciate urology care.  ?

## 2017-03-11 NOTE — Assessment & Plan Note (Signed)
Continue gabapentin.

## 2017-03-11 NOTE — Assessment & Plan Note (Signed)
Chronic, stable. Continue current regimen. 

## 2017-03-11 NOTE — Assessment & Plan Note (Signed)
Preventative protocols reviewed and updated unless pt declined. Discussed healthy diet and lifestyle.  

## 2017-03-11 NOTE — Assessment & Plan Note (Signed)
Discussed healthy diet and lifestyle changes to affect sustainable weight loss  

## 2017-03-11 NOTE — Assessment & Plan Note (Signed)
Chronic, deteriorated - will increase metformin to 1000mg  bid. Discussed importance of following diabetic diet. 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

## 2017-03-11 NOTE — Assessment & Plan Note (Deleted)
This has improved on terbinafine cream daily.

## 2017-03-18 DIAGNOSIS — C61 Malignant neoplasm of prostate: Secondary | ICD-10-CM | POA: Diagnosis not present

## 2017-03-19 ENCOUNTER — Telehealth: Payer: Self-pay | Admitting: Family Medicine

## 2017-03-19 MED ORDER — METFORMIN HCL 1000 MG PO TABS: 1000.0000 mg | ORAL_TABLET | Freq: Two times a day (BID) | ORAL | 3 refills | Status: DC

## 2017-03-19 NOTE — Telephone Encounter (Signed)
Per office visit on 03-11-2017 Dr. Danise Mina increased the Metformin to 1000mg  bid. / Script sent to pharmacy for old dosage of 850 metFORMIN (GLUCOPHAGE) 850 MG tablet 180 tablet 3 03/11/2017    Sig: Take one tablet twice daily with meals   Sent to pharmacy as: metFORMIN (GLUCOPHAGE) 850 MG tablet   E-Prescribing Status: Receipt confirmed by pharmacy (03/11/2017 9:13 AM EST)    /  Can a new prescription for the new dosage of 1000mg  BID be sent to [facility] [place], [place] [phone] (Phone) [phone] (Fax)   /    Copied from [place] [id number]. Topic: Quick Communication - See Telephone Encounter >> Mar 19, 2017  2:40 PM Arletha Grippe wrote: CRM for notification. See Telephone encounter for:   03/19/17.pt called - he states that he was supposed to get metformin 1000 mg, because that is what he and provider discussed, pt pt received 850mg  in the mail.  Pt needs rx sent to [place], [place] [phone] (Phone) [phone] (Fax) for the 1000 mg.   Pt cb number is [phone]

## 2017-03-19 NOTE — Telephone Encounter (Signed)
plz notify new dose sent in.

## 2017-03-19 NOTE — Telephone Encounter (Signed)
Spoke with pt relaying message per Dr. Darnell Level.

## 2017-03-19 NOTE — Telephone Encounter (Signed)
Forwarded to Dr. Darnell Level for new rx with new strength.

## 2017-03-19 NOTE — Addendum Note (Signed)
Addended by: Ria Bush on: 03/19/2017 05:34 PM   Modules accepted: Orders

## 2017-03-25 DIAGNOSIS — N402 Nodular prostate without lower urinary tract symptoms: Secondary | ICD-10-CM | POA: Diagnosis not present

## 2017-03-25 DIAGNOSIS — C61 Malignant neoplasm of prostate: Secondary | ICD-10-CM | POA: Diagnosis not present

## 2017-03-26 DIAGNOSIS — S139XXD Sprain of joints and ligaments of unspecified parts of neck, subsequent encounter: Secondary | ICD-10-CM | POA: Diagnosis not present

## 2017-03-26 DIAGNOSIS — M961 Postlaminectomy syndrome, not elsewhere classified: Secondary | ICD-10-CM | POA: Diagnosis not present

## 2017-03-27 ENCOUNTER — Other Ambulatory Visit: Payer: Self-pay | Admitting: Family Medicine

## 2017-03-27 NOTE — Telephone Encounter (Signed)
Last filled:  02/25/17, #90 Last OV (CPE):  03/11/17 Next OV:  07/11/17

## 2017-03-28 NOTE — Telephone Encounter (Signed)
Too soon? If received #90 last month that should be 3 month supply.

## 2017-03-29 NOTE — Telephone Encounter (Signed)
Noted  

## 2017-03-29 NOTE — Telephone Encounter (Signed)
See other note

## 2017-04-14 ENCOUNTER — Other Ambulatory Visit: Payer: Self-pay | Admitting: Family Medicine

## 2017-04-15 NOTE — Telephone Encounter (Signed)
Last filled:  02/25/17, #90 Last OV (CPE):  03/11/17 Next OV:  07/11/17

## 2017-05-21 DIAGNOSIS — H35373 Puckering of macula, bilateral: Secondary | ICD-10-CM | POA: Diagnosis not present

## 2017-05-21 DIAGNOSIS — H40013 Open angle with borderline findings, low risk, bilateral: Secondary | ICD-10-CM | POA: Diagnosis not present

## 2017-05-21 DIAGNOSIS — H353131 Nonexudative age-related macular degeneration, bilateral, early dry stage: Secondary | ICD-10-CM | POA: Diagnosis not present

## 2017-05-21 DIAGNOSIS — H04123 Dry eye syndrome of bilateral lacrimal glands: Secondary | ICD-10-CM | POA: Diagnosis not present

## 2017-05-21 DIAGNOSIS — H3589 Other specified retinal disorders: Secondary | ICD-10-CM | POA: Diagnosis not present

## 2017-05-21 LAB — HM DIABETES EYE EXAM

## 2017-05-28 ENCOUNTER — Encounter: Payer: Self-pay | Admitting: Family Medicine

## 2017-06-02 ENCOUNTER — Other Ambulatory Visit: Payer: Self-pay | Admitting: Family Medicine

## 2017-06-02 DIAGNOSIS — E7800 Pure hypercholesterolemia, unspecified: Secondary | ICD-10-CM

## 2017-06-03 NOTE — Telephone Encounter (Signed)
Last filled 04/14/17, #180 Last OV (CPE):  03/11/17 Next OV:  07/11/17

## 2017-06-05 DIAGNOSIS — M542 Cervicalgia: Secondary | ICD-10-CM | POA: Diagnosis not present

## 2017-06-13 ENCOUNTER — Other Ambulatory Visit: Payer: Self-pay

## 2017-06-13 NOTE — Telephone Encounter (Signed)
Last rx:  08/29/16, #20 Last OV (CPE):  03/11/17 Next OV:  07/11/17

## 2017-06-16 MED ORDER — NAPROXEN 375 MG PO TABS: 375.0000 mg | ORAL_TABLET | Freq: Two times a day (BID) | ORAL | 2 refills | Status: DC

## 2017-06-27 DIAGNOSIS — Z79891 Long term (current) use of opiate analgesic: Secondary | ICD-10-CM | POA: Diagnosis not present

## 2017-07-01 DIAGNOSIS — H04123 Dry eye syndrome of bilateral lacrimal glands: Secondary | ICD-10-CM | POA: Diagnosis not present

## 2017-07-01 DIAGNOSIS — Z7984 Long term (current) use of oral hypoglycemic drugs: Secondary | ICD-10-CM | POA: Diagnosis not present

## 2017-07-01 DIAGNOSIS — H353131 Nonexudative age-related macular degeneration, bilateral, early dry stage: Secondary | ICD-10-CM | POA: Diagnosis not present

## 2017-07-01 DIAGNOSIS — E119 Type 2 diabetes mellitus without complications: Secondary | ICD-10-CM | POA: Diagnosis not present

## 2017-07-01 DIAGNOSIS — H40013 Open angle with borderline findings, low risk, bilateral: Secondary | ICD-10-CM | POA: Diagnosis not present

## 2017-07-01 LAB — HM DIABETES EYE EXAM

## 2017-07-08 ENCOUNTER — Encounter: Payer: Self-pay | Admitting: Family Medicine

## 2017-07-11 ENCOUNTER — Encounter: Payer: Self-pay | Admitting: Family Medicine

## 2017-07-11 ENCOUNTER — Ambulatory Visit: Payer: Medicare Other | Admitting: Family Medicine

## 2017-07-11 VITALS — BP 132/84 | HR 65 | Temp 98.3°F | Wt 243.0 lb

## 2017-07-11 DIAGNOSIS — E1165 Type 2 diabetes mellitus with hyperglycemia: Secondary | ICD-10-CM

## 2017-07-11 DIAGNOSIS — E118 Type 2 diabetes mellitus with unspecified complications: Secondary | ICD-10-CM | POA: Diagnosis not present

## 2017-07-11 DIAGNOSIS — G4700 Insomnia, unspecified: Secondary | ICD-10-CM

## 2017-07-11 DIAGNOSIS — IMO0002 Reserved for concepts with insufficient information to code with codable children: Secondary | ICD-10-CM

## 2017-07-11 LAB — BASIC METABOLIC PANEL
BUN: 13 mg/dL (ref 6–23)
CO2: 28 meq/L (ref 19–32)
Calcium: 9.2 mg/dL (ref 8.4–10.5)
Chloride: 103 mEq/L (ref 96–112)
Creatinine, Ser: 1.01 mg/dL (ref 0.40–1.50)
GFR: 94.64 mL/min (ref >60.00)
GLUCOSE: 157 mg/dL — AB (ref 70–99)
Potassium: 4.1 mEq/L (ref 3.5–5.1)
SODIUM: 139 meq/L (ref 135–145)

## 2017-07-11 LAB — HEMOGLOBIN A1C: Hgb A1c MFr Bld: 8.1 % — ABNORMAL HIGH (ref 4.6–6.5)

## 2017-07-11 MED ORDER — MELATONIN 5 MG PO TABS: 1.0000 | ORAL_TABLET | Freq: Every evening | ORAL | 1 refills | Status: DC | PRN

## 2017-07-11 NOTE — Assessment & Plan Note (Signed)
Chronic. Reports compliance with current regimen. Check A1c today. No hypoglycemia. discussed DSME - he declines referral today but states he would be interested this coming summer. Podiatry referral today for thickened elongated nails which he has difficulty treating alone.

## 2017-07-11 NOTE — Patient Instructions (Addendum)
Labs today Try melatonin 3-5 mg at bedtime.  We will refer you to podiatry.  Sleep hygiene checklist: 1. Avoid naps during the day 2. Avoid stimulants such as caffeine and nicotine. Avoid bedtime alcohol (it can speed onset of sleep but the body's metabolism can cause awakenings). 3. All forms of exercise help ensure sound sleep - limit vigorous exercise to morning or late afternoon 4. Avoid food too close to bedtime including chocolate (which contains caffeine) 5. Soak up natural light 6. Establish regular bedtime routine. 7. Associate bed with sleep - avoid TV, computer or phone, reading while in bed. 8. Ensure pleasant, relaxing sleep environment - quiet, dark, cool room.

## 2017-07-11 NOTE — Assessment & Plan Note (Signed)
Sleep hygiene measures reviewed, handout provided. I asked him to let me know what he's been using otc.  May try melatonin at night, update with effect

## 2017-07-11 NOTE — Progress Notes (Signed)
BP 132/84 (BP Location: Left Arm, Patient Position: Sitting, Cuff Size: Large)   Pulse 65   Temp 98.3 F (36.8 C) (Oral)   Wt 243 lb (110.2 kg)   SpO2 97%   BMI 36.95 kg/m    CC: 4 mo f/u visit Subjective:    Patient ID: [name], male    DOB: [birth year], 67 y.o.   MRN: [id number]  HPI: [name] is a 67 y.o. male presenting on 07/11/2017 for 4 mo follow-up and Insomnia (Having trouble staying sleep. Worked 3rd shift for yrs, now retired. 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 He does take naps during the day. He's been taking night time sleeping aide - doesn't know what this is.   DM - does regularly check sugars 3 times a day. Compliant with antihyperglycemic regimen which includes: metformin 1000mg  bid and glipizide 10mg  XL daily. Few low sugars - none below 70. Denies paresthesias. Last diabetic eye exam 06/2017. Pneumovax: 2014. Prevnar: 2017. Glucometer brand: one-touch. DSME: did not complete - interested in referral after August 2019. Requests podiatry referral.  Lab Results  Component Value Date   HGBA1C 8.7 (H) 03/04/2017   Diabetic Foot Exam - Simple   Simple Foot Form Diabetic Foot exam was performed with the following findings:  Yes 07/11/2017  8:20 AM  Visual Inspection See comments:  Yes Sensation Testing Intact to touch and monofilament testing bilaterally:  Yes Pulse Check Posterior Tibialis and Dorsalis pulse intact bilaterally:  Yes Comments Thickened nails    Lab Results  Component Value Date   MICROALBUR 3.2 (H) 06/16/2012     Relevant past medical, surgical, family and social history reviewed and updated as indicated. Interim medical history since our last visit reviewed. Allergies and medications reviewed and updated. Outpatient Medications Prior to Visit  Medication Sig Dispense Refill  . aspirin EC 81 MG tablet Take 81 mg by mouth daily.    Marland Kitchen atorvastatin (LIPITOR) 10 MG  tablet TAKE 1 TABLET BY MOUTH  DAILY 90 tablet 2  . baclofen (LIORESAL) 10 MG tablet TAKE 1 TABLET BY MOUTH AT  BEDTIME AS NEEDED FOR BACK  SPASMS 90 tablet 0  . baclofen (LIORESAL) 10 MG tablet TAKE 1 TABLET BY MOUTH AT  BEDTIME AS NEEDED FOR BACK  SPASMS 90 tablet 0  . fluticasone (FLONASE) 50 MCG/ACT nasal spray USE 2 SPRAYS IN EACH  NOSTRIL DAILY 48 g 3  . gabapentin (NEURONTIN) 300 MG capsule TAKE 1 CAPSULE BY MOUTH TWO TIMES DAILY 180 capsule 0  . glipiZIDE (GLUCOTROL XL) 10 MG 24 hr tablet TAKE 1 TABLET BY MOUTH  DAILY WITH BREAKFAST 90 tablet 2  . glucose blood (ONE TOUCH ULTRA TEST) test strip USE TO CHECK SUGAR TWICE  DAILY. 200 each 0  . losartan (COZAAR) 50 MG tablet Take 1 tablet (50 mg total) daily by mouth. 90 tablet 3  . metFORMIN (GLUCOPHAGE) 1000 MG tablet Take 1 tablet (1,000 mg total) by mouth 2 (two) times daily with a meal. Take one tablet twice daily with meals 180 tablet 3  . naproxen (NAPROSYN) 375 MG tablet Take 1 tablet (375 mg total) by mouth 2 (two) times daily with a meal. 20 tablet 2  . omeprazole (PRILOSEC) 40 MG capsule TAKE 1 CAPSULE BY MOUTH  DAILY 90 capsule 2  . oxyCODONE-acetaminophen (PERCOCET) 10-325 MG per tablet Take 1-2 tablets by mouth every 4 (four) hours as needed for pain.  60 tablet 0  . sildenafil (VIAGRA) 100 MG tablet Take 0.5-1 tablets (50-100 mg total) by mouth daily as needed for erectile dysfunction. 5 tablet 3  . tamsulosin (FLOMAX) 0.4 MG CAPS capsule Take 1 capsule (0.4 mg total) by mouth daily after supper. 90 capsule 3   No facility-administered medications prior to visit.      Per HPI unless specifically indicated in ROS section below Review of Systems     Objective:    BP 132/84 (BP Location: Left Arm, Patient Position: Sitting, Cuff Size: Large)   Pulse 65   Temp 98.3 F (36.8 C) (Oral)   Wt 243 lb (110.2 kg)   SpO2 97%   BMI 36.95 kg/m   Wt Readings from Last 3 Encounters:  07/11/17 243 lb (110.2 kg)  03/11/17 243 lb  (110.2 kg)  03/04/17 241 lb 4 oz (109.4 kg)    Physical Exam Results for orders placed or performed in visit on 07/08/17  HM DIABETES EYE EXAM  Result Value Ref Range   HM Diabetic Eye Exam No Retinopathy No Retinopathy      Assessment & Plan:   Problem List Items Addressed This Visit    Diabetes mellitus type 2, uncontrolled, with complications ([place]) - Primary    Chronic. Reports compliance with current regimen. Check A1c today. No hypoglycemia. discussed DSME - he declines referral today but states he would be interested this coming summer. Podiatry referral today for thickened elongated nails which he has difficulty treating alone.       Relevant Orders   Hemoglobin W2B   Basic metabolic panel   Ambulatory referral to Podiatry   Insomnia    Sleep hygiene measures reviewed, handout provided. I asked him to let me know what he's been using otc.  May try melatonin at night, update with effect          Meds ordered this encounter  Medications  . Melatonin 5 MG TABS    Sig: Take 1 tablet (5 mg total) by mouth at bedtime as needed (sleep).    Dispense:  90 tablet    Refill:  1   Orders Placed This Encounter  Procedures  . Hemoglobin A1c  . Basic metabolic panel  . Ambulatory referral to Podiatry    Referral Priority:   Routine    Referral Type:   Consultation    Referral Reason:   Specialty Services Required    Requested Specialty:   Podiatry    Number of Visits Requested:   1    Follow up plan: Return in about 4 months (around 11/10/2017) for follow up visit.  Ria Bush, MD

## 2017-07-13 ENCOUNTER — Other Ambulatory Visit: Payer: Self-pay | Admitting: Family Medicine

## 2017-07-16 ENCOUNTER — Telehealth: Payer: Self-pay

## 2017-07-16 ENCOUNTER — Other Ambulatory Visit: Payer: Self-pay | Admitting: Family Medicine

## 2017-07-16 MED ORDER — EMPAGLIFLOZIN 10 MG PO TABS: 10.0000 mg | ORAL_TABLET | Freq: Every day | ORAL | 3 refills | Status: DC

## 2017-07-16 NOTE — Telephone Encounter (Signed)
Janett with [facility] transferred pt; I spoke with pt and from the lab result note earlier today pt was not sure what the Vania Rea was supposed to be used for. I advised pt per result note and he voiced understanding and said to proceed by sending med to pharmacy. Nothing further needed.

## 2017-07-17 ENCOUNTER — Other Ambulatory Visit: Payer: Self-pay | Admitting: Family Medicine

## 2017-07-17 NOTE — Telephone Encounter (Signed)
Last filled:  07/15/17, #180 Last OV:  07/11/17 Next OV:  11/11/17

## 2017-07-17 NOTE — Telephone Encounter (Signed)
Last filled:  04/29/17, #90 Last OV:  07/11/17 Next OV:  11/11/17

## 2017-07-19 NOTE — Telephone Encounter (Signed)
#  180 was sent in 07/15/2017? plz call pharmacy to find out why need another Rx so soon.

## 2017-07-22 NOTE — Telephone Encounter (Signed)
Spoke with [facility] and told pt was shipped rx on 07/17/17 but there are no more refills on rx.

## 2017-07-25 NOTE — Telephone Encounter (Signed)
Sent in with refill X1

## 2017-07-27 ENCOUNTER — Other Ambulatory Visit: Payer: Self-pay | Admitting: Family Medicine

## 2017-07-29 NOTE — Telephone Encounter (Signed)
Sent refill to [facility].

## 2017-08-02 ENCOUNTER — Ambulatory Visit: Payer: Self-pay | Admitting: *Deleted

## 2017-08-02 NOTE — Telephone Encounter (Signed)
Patient states he has stomach upset when he eats- he gets nauseated and lays down- after rest he gets dizzy. Patient also has fatigue and hot flashes out of nowhere. Patient has multiple complaints. Patient wants to know if he can get a referral to a GI for his stomach. He states he has trouble swallowing and wakes up at night gaging. Patient reports he has had multiple symptoms for several months. Appointment for OV next week to discuss the dizziness he is having when he gets up after laying down.  Reason for Disposition . [1] MODERATE dizziness (e.g., interferes with normal activities) AND [2] has NOT been evaluated by physician for this  (Exception: dizziness caused by heat exposure, sudden standing, or poor fluid intake)  Answer Assessment - Initial Assessment Questions 1. DESCRIPTION: "Describe your dizziness."     Patient gets so dizzy at times that he feels he might fall- pains on L side 2. LIGHTHEADED: "Do you feel lightheaded?" (e.g., somewhat faint, woozy, weak upon standing)     Somewhat faint, woozy, weak 3. VERTIGO: "Do you feel like either you or the room is spinning or tilting?" (i.e. vertigo)     no 4. SEVERITY: "How bad is it?"  "Do you feel like you are going to faint?" "Can you stand and walk?"   - MILD - walking normally   - MODERATE - interferes with normal activities (e.g., work, school)    - SEVERE - unable to stand, requires support to walk, feels like passing out now.      moderate 5. ONSET:  "When did the dizziness begin?"     3-4 months 6. AGGRAVATING FACTORS: "Does anything make it worse?" (e.g., standing, change in head position)     Eating- stomach/nausea- patient has problems swallowing wakes up at night gaging 7. HEART RATE: "Can you tell me your heart rate?" "How many beats in 15 seconds?"  (Note: not all patients can do this)       normal 8. CAUSE: "What do you think is causing the dizziness?"     Mostly happens after laying down- can happen when getting  up from sitting 9. RECURRENT SYMPTOM: "Have you had dizziness before?" If so, ask: "When was the last time?" "What happened that time?"     no 10. OTHER SYMPTOMS: "Do you have any other symptoms?" (e.g., fever, chest pain, vomiting, diarrhea, bleeding)       Chest pain, hot flashes and headache when dizzy 11. PREGNANCY: "Is there any chance you are pregnant?" "When was your last menstrual period?"       n/a  Protocols used: DIZZINESS [facility]

## 2017-08-06 ENCOUNTER — Ambulatory Visit: Payer: Medicare Other | Admitting: Family Medicine

## 2017-08-06 ENCOUNTER — Encounter: Payer: Self-pay | Admitting: Family Medicine

## 2017-08-06 VITALS — Temp 98.1°F | Ht 73.0 in | Wt 239.5 lb

## 2017-08-06 DIAGNOSIS — R42 Dizziness and giddiness: Secondary | ICD-10-CM | POA: Diagnosis not present

## 2017-08-06 DIAGNOSIS — E1165 Type 2 diabetes mellitus with hyperglycemia: Secondary | ICD-10-CM

## 2017-08-06 DIAGNOSIS — IMO0002 Reserved for concepts with insufficient information to code with codable children: Secondary | ICD-10-CM

## 2017-08-06 DIAGNOSIS — N481 Balanitis: Secondary | ICD-10-CM | POA: Diagnosis not present

## 2017-08-06 DIAGNOSIS — E118 Type 2 diabetes mellitus with unspecified complications: Secondary | ICD-10-CM | POA: Diagnosis not present

## 2017-08-06 MED ORDER — EMPAGLIFLOZIN 10 MG PO TABS: 10.0000 mg | ORAL_TABLET | Freq: Every day | ORAL | 3 refills | Status: DC

## 2017-08-06 MED ORDER — MUPIROCIN CALCIUM 2 % EX CREA: 1.0000 "application " | TOPICAL_CREAM | Freq: Two times a day (BID) | CUTANEOUS | 0 refills | Status: DC

## 2017-08-06 NOTE — Assessment & Plan Note (Signed)
Benign exam. Possible gabapentin related lightheadedness - symptoms seem to occur after he takes am gabapentin. rec change gabapentin to lunch or dinner + bedtime dosing. Update with effect. Pt agrees with plan.

## 2017-08-06 NOTE — Patient Instructions (Addendum)
For penis infection - treat with antibacterial cream sent to pharmacy - use twice daily for 1 week. Update [place] if not improving.  Change gabapentin to lunch time or dinner time and then again at bedtime. Update [place] with effect on dizziness.  Continue other medicines for now.

## 2017-08-06 NOTE — Progress Notes (Signed)
Temp 98.1 F (36.7 C) (Oral)   Ht 6\' 1"  (1.854 m)   Wt 239 lb 8 oz (108.6 kg)   SpO2 98%   BMI 31.60 kg/m   Sitting BP 124/78 Supine BP 138/78 Standing BP 136/84  CC: dizziness, L arm paresthesias Subjective:    Patient ID: [name], male    DOB: November 07, 1950, 67 y.o.   MRN: [id number]  HPI: [name] is a 67 y.o. male presenting on 08/06/2017 for Dizziness (Gets dizzy after lying or sitting down for long periods of time. Pt states that he is experiencing numbness/tingling in left arm. Left side of face was sore yesterday. Pt states that regardless of his diet it bothers his stomach.)   Noted dizziness over last 2-3 months, worse in the mornings when getting up from sitting or supine. Describes lightheaded/presyncope. No vertigo or syncope. No significant chest pain or palpitations. No hypotension or hypoglycemia with these symptoms. Also noticing left arm paresthesias for 3 months associated with L cervical neck pain x 2 wks. Sees Dr Rolena Infante ortho. He does take gabapentin 300mg  bid as well as naprosyn 375mg  bid and baclofen at bedtime. Also on chronic oxycodone.   We started jardiance last week - improved readings noted - fasting sugars 140-150s.  Normal orthostatics today.   2 wk h/o rash on penis associated with burning. Started prior to [employer]. Started when he started using gold bond moisturizing cream.   Relevant past medical, surgical, family and social history reviewed and updated as indicated. Interim medical history since our last visit reviewed. Allergies and medications reviewed and updated. Outpatient Medications Prior to Visit  Medication Sig Dispense Refill  . aspirin EC 81 MG tablet Take 81 mg by mouth daily.    Marland Kitchen atorvastatin (LIPITOR) 10 MG tablet TAKE 1 TABLET BY MOUTH  DAILY 90 tablet 2  . baclofen (LIORESAL) 10 MG tablet TAKE 1 TABLET BY MOUTH AT  BEDTIME AS NEEDED FOR BACK  SPASMS 90 tablet 1  . fluticasone (FLONASE) 50 MCG/ACT nasal spray USE  2 SPRAYS IN EACH  NOSTRIL DAILY 48 g 3  . gabapentin (NEURONTIN) 300 MG capsule TAKE 1 CAPSULE BY MOUTH TWO TIMES DAILY 180 capsule 1  . glipiZIDE (GLUCOTROL XL) 10 MG 24 hr tablet TAKE 1 TABLET BY MOUTH  DAILY WITH BREAKFAST 90 tablet 2  . glucose blood (ONE TOUCH ULTRA TEST) test strip USE TO CHECK SUGAR TWICE  DAILY. 200 each 1  . losartan (COZAAR) 50 MG tablet Take 1 tablet (50 mg total) daily by mouth. 90 tablet 3  . Melatonin 5 MG TABS Take 1 tablet (5 mg total) by mouth at bedtime as needed (sleep). 90 tablet 1  . metFORMIN (GLUCOPHAGE) 1000 MG tablet Take 1 tablet (1,000 mg total) by mouth 2 (two) times daily with a meal.    . naproxen (NAPROSYN) 375 MG tablet Take 1 tablet (375 mg total) by mouth 2 (two) times daily with a meal. 20 tablet 2  . omeprazole (PRILOSEC) 40 MG capsule TAKE 1 CAPSULE BY MOUTH  DAILY 90 capsule 2  . oxyCODONE-acetaminophen (PERCOCET) 10-325 MG per tablet Take 1-2 tablets by mouth every 4 (four) hours as needed for pain. 60 tablet 0  . sildenafil (VIAGRA) 100 MG tablet Take 0.5-1 tablets (50-100 mg total) by mouth daily as needed for erectile dysfunction. 5 tablet 3  . tamsulosin (FLOMAX) 0.4 MG CAPS capsule Take 1 capsule (0.4 mg total) by mouth daily after supper. 90 capsule 3  .  empagliflozin (JARDIANCE) 10 MG TABS tablet Take 10 mg by mouth daily. 30 tablet 3  . metFORMIN (GLUCOPHAGE) 1000 MG tablet Take 1 tablet (1,000 mg total) by mouth 2 (two) times daily with a meal. Take one tablet twice daily with meals 180 tablet 3   No facility-administered medications prior to visit.      Per HPI unless specifically indicated in ROS section below Review of Systems     Objective:    Temp 98.1 F (36.7 C) (Oral)   Ht 6\' 1"  (1.854 m)   Wt 239 lb 8 oz (108.6 kg)   SpO2 98%   BMI 31.60 kg/m   Wt Readings from Last 3 Encounters:  08/06/17 239 lb 8 oz (108.6 kg)  07/11/17 243 lb (110.2 kg)  03/11/17 243 lb (110.2 kg)    Physical Exam  Constitutional: He  appears well-developed and well-nourished. No distress.  HENT:  Nose: Nose normal.  Mouth/Throat: No oropharyngeal exudate.  Eyes: Pupils are equal, round, and reactive to light. Conjunctivae and EOM are normal.  Neck: Normal range of motion. Neck supple. Carotid bruit is not present. No thyromegaly present.  Cardiovascular: Normal rate, regular rhythm and normal heart sounds.  No murmur heard. Pulmonary/Chest: Effort normal and breath sounds normal. No stridor. No respiratory distress. He has no wheezes. He has no rales.  Genitourinary: Testes normal. Uncircumcised. Penile erythema (around glans) and penile tenderness present. No phimosis. No discharge found.  Genitourinary Comments: Erythema and mild swelling of glans  Lymphadenopathy: No inguinal adenopathy noted on the right or left side.  Neurological: He is alert. He has normal strength. No cranial nerve deficit or sensory deficit. He displays a negative Romberg sign. Coordination and gait normal.  CN 2-12 intact FTN intact EOMI No pronator drift  Skin: Skin is warm and dry. Rash (on penis) noted.  Nursing note and vitals reviewed.  Results for orders placed or performed in visit on 07/11/17  Hemoglobin A1c  Result Value Ref Range   Hgb A1c MFr Bld 8.1 (H) 4.6 - 6.5 %  Basic metabolic panel  Result Value Ref Range   Sodium 139 135 - 145 mEq/L   Potassium 4.1 3.5 - 5.1 mEq/L   Chloride 103 96 - 112 mEq/L   CO2 28 19 - 32 mEq/L   Glucose, Bld 157 (H) 70 - 99 mg/dL   BUN 13 6 - 23 mg/dL   Creatinine, Ser 1.01 0.40 - 1.50 mg/dL   Calcium 9.2 8.4 - 10.5 mg/dL   GFR 94.64 >60.00 mL/min      Assessment & Plan:   Problem List Items Addressed This Visit    Balanitis - Primary    Fungal vs bacterial. Tender > itchy - will treat with bactroban. Update if no better, consider lotrimin treatment. Pt agrees with plan.       Diabetes mellitus type 2, uncontrolled, with complications ([place])    He has noted improved control since  starting jardiance. Continue.       Relevant Medications   empagliflozin (JARDIANCE) 10 MG TABS tablet   metFORMIN (GLUCOPHAGE) 1000 MG tablet   Lightheadedness    Benign exam. Possible gabapentin related lightheadedness - symptoms seem to occur after he takes am gabapentin. rec change gabapentin to lunch or dinner + bedtime dosing. Update with effect. Pt agrees with plan.           Meds ordered this encounter  Medications  . empagliflozin (JARDIANCE) 10 MG TABS tablet    Sig: Take  10 mg by mouth daily.    Dispense:  90 tablet    Refill:  3    Please call patient to let him know cost prior to filling  . mupirocin cream (BACTROBAN) 2 %    Sig: Apply 1 application topically 2 (two) times daily.    Dispense:  15 g    Refill:  0   No orders of the defined types were placed in this encounter.   Follow up plan: Return if symptoms worsen or fail to improve.  Ria Bush, MD

## 2017-08-06 NOTE — Assessment & Plan Note (Signed)
He has noted improved control since starting jardiance. Continue.

## 2017-08-06 NOTE — Assessment & Plan Note (Signed)
Fungal vs bacterial. Tender > itchy - will treat with bactroban. Update if no better, consider lotrimin treatment. Pt agrees with plan.

## 2017-08-07 ENCOUNTER — Telehealth: Payer: Self-pay

## 2017-08-07 MED ORDER — GABAPENTIN 100 MG PO CAPS: ORAL_CAPSULE | ORAL | 3 refills | Status: DC

## 2017-08-07 MED ORDER — MUPIROCIN 2 % EX OINT: 1.0000 "application " | TOPICAL_OINTMENT | Freq: Two times a day (BID) | CUTANEOUS | 0 refills | Status: DC

## 2017-08-07 NOTE — Telephone Encounter (Signed)
Spoke with pt relaying Dr. Synthia Innocent message about the ointment.  Pt verbalizes understanding.  Also, pt agrees to try lower dose of gabapentin.  States he is changing his local pharmacy to [facility].  *Updated pt's pharmacy list.*

## 2017-08-07 NOTE — Addendum Note (Signed)
Addended by: Ria Bush on: 08/07/2017 07:15 PM   Modules accepted: Orders

## 2017-08-07 NOTE — Telephone Encounter (Signed)
Spoke with pt states he cannot afford mupirocin cream.  Also, states gabapentin is what has been causing his dizziness.  Says he took it yesterday morning before OV and was a little dizzy.  States he has not taken since and has been fine so far.  *Updated pt's allergy list*

## 2017-08-07 NOTE — Telephone Encounter (Signed)
Gabapentin 100mg  sent to pharmacy.

## 2017-08-07 NOTE — Telephone Encounter (Signed)
I've sent in ointment - have him price this out.  Does he want to try lower dose gabapentin for night time only?

## 2017-08-07 NOTE — Telephone Encounter (Signed)
Copied from [place] [id number]. Topic: Quick Communication - See Telephone Encounter >> Aug 07, 2017  8:19 AM Hewitt Shorts wrote: CRM for notification. See Telephone encounter for: 08/07/17.  Pt states that he needs a call back from the office regarding what happened yesterday (which he said was personal) that it was the medication is all the patient will say  Best number is [phone]

## 2017-08-08 NOTE — Telephone Encounter (Signed)
Spoke with pt relaying Dr. Synthia Innocent message.  Says ok and he will let Dr. Darnell Level know how he does with it.

## 2017-08-23 ENCOUNTER — Encounter: Payer: Self-pay | Admitting: Podiatry

## 2017-08-23 ENCOUNTER — Ambulatory Visit (INDEPENDENT_AMBULATORY_CARE_PROVIDER_SITE_OTHER): Payer: Medicare Other | Admitting: Podiatry

## 2017-08-23 VITALS — BP 141/91 | HR 72

## 2017-08-23 DIAGNOSIS — M79675 Pain in left toe(s): Secondary | ICD-10-CM | POA: Diagnosis not present

## 2017-08-23 DIAGNOSIS — M79674 Pain in right toe(s): Secondary | ICD-10-CM

## 2017-08-23 DIAGNOSIS — B351 Tinea unguium: Secondary | ICD-10-CM | POA: Diagnosis not present

## 2017-08-23 DIAGNOSIS — E1142 Type 2 diabetes mellitus with diabetic polyneuropathy: Secondary | ICD-10-CM

## 2017-08-23 NOTE — Progress Notes (Signed)
This patient presents to the office with chief complaint of long thick nails and diabetic feet.  This patient  says he is having no pain and discomfort in his feet.  This patient says he has long thick painful nails.  These nails are painful walking and wearing shoes.  He has no history of infection or drainage from both feet.  This patient presents the office today for treatment of the  long nails and a foot evaluation due to history of  Diabetes. Patient is diagnosed as DM with neuropathy.  General Appearance  Alert, conversant and in no acute stress.  Vascular  Dorsalis pedis and posterior tibial  pulses are palpable  bilaterally.  Capillary return is within normal limits  bilaterally. Temperature is within normal limits  bilaterally.  Neurologic  Senn-Weinstein monofilament wire test within normal limits  bilaterally. Muscle power within normal limits bilaterally.  Nails Thick disfigured discolored nails with subungual debris  from hallux to fifth toes bilaterally. No evidence of bacterial infection or drainage bilaterally.  Orthopedic  No limitations of motion of motion feet .  No crepitus or effusions noted.  No bony pathology or digital deformities noted.  Skin  normotropic skin with no porokeratosis noted bilaterally.  No signs of infections or ulcers noted.     Onychomycosis  Diabetes with neuropathy  IE  Debride nails x 10.  A diabetic foot exam was performed and there is no evidence of any vascular or neurologic pathology.   RTC 3 months.   Gardiner Barefoot DPM

## 2017-09-17 DIAGNOSIS — C61 Malignant neoplasm of prostate: Secondary | ICD-10-CM | POA: Diagnosis not present

## 2017-09-23 DIAGNOSIS — C61 Malignant neoplasm of prostate: Secondary | ICD-10-CM | POA: Diagnosis not present

## 2017-10-16 DIAGNOSIS — M542 Cervicalgia: Secondary | ICD-10-CM | POA: Diagnosis not present

## 2017-11-11 ENCOUNTER — Ambulatory Visit: Payer: Medicare Other | Admitting: Family Medicine

## 2017-11-18 ENCOUNTER — Encounter: Payer: Self-pay | Admitting: Family Medicine

## 2017-11-18 ENCOUNTER — Ambulatory Visit (INDEPENDENT_AMBULATORY_CARE_PROVIDER_SITE_OTHER): Payer: Medicare Other | Admitting: Family Medicine

## 2017-11-18 VITALS — BP 126/70 | HR 73 | Temp 98.2°F | Ht 68.0 in | Wt 238.0 lb

## 2017-11-18 DIAGNOSIS — IMO0002 Reserved for concepts with insufficient information to code with codable children: Secondary | ICD-10-CM

## 2017-11-18 DIAGNOSIS — E1165 Type 2 diabetes mellitus with hyperglycemia: Secondary | ICD-10-CM | POA: Diagnosis not present

## 2017-11-18 DIAGNOSIS — G4733 Obstructive sleep apnea (adult) (pediatric): Secondary | ICD-10-CM | POA: Diagnosis not present

## 2017-11-18 DIAGNOSIS — E118 Type 2 diabetes mellitus with unspecified complications: Secondary | ICD-10-CM

## 2017-11-18 DIAGNOSIS — E1142 Type 2 diabetes mellitus with diabetic polyneuropathy: Secondary | ICD-10-CM | POA: Diagnosis not present

## 2017-11-18 LAB — POCT GLYCOSYLATED HEMOGLOBIN (HGB A1C): Hemoglobin A1C: 6.7 % — AB (ref 4.0–5.6)

## 2017-11-18 MED ORDER — GABAPENTIN 100 MG PO CAPS: 100.0000 mg | ORAL_CAPSULE | Freq: Two times a day (BID) | ORAL | 1 refills | Status: DC

## 2017-11-18 NOTE — Assessment & Plan Note (Signed)
Chronic. Anticipate better control based on recall cbg's. Update A1c today. Continue current regimen. Encouraged he consider diabetes education classes. Declines at this time. Jardiance started earlier this year.

## 2017-11-18 NOTE — Patient Instructions (Signed)
Think about diabetes classes  Return as needed or in 4 months for physical.  A1c check today.

## 2017-11-18 NOTE — Assessment & Plan Note (Signed)
Continue gabapentin, decrease to 100mg  bid. Higher doses may cause dizziness.

## 2017-11-18 NOTE — Progress Notes (Signed)
BP 126/70 (BP Location: Left Arm, Patient Position: Sitting, Cuff Size: Normal)   Pulse 73   Temp 98.2 F (36.8 C) (Oral)   Ht 5\' 8"  (1.727 m)   Wt 238 lb (108 kg)   SpO2 98%   BMI 36.19 kg/m    CC: 29mo f/u visit Subjective:    Patient ID: [name], male    DOB: March 23, 1951, 67 y.o.   MRN: [id number]  HPI: [name] is a 67 y.o. male presenting on 11/18/2017 for 4 mo follow up   DM - does regularly check sugars - fasting this morning 118, PM drops to 79-90. Compliant with antihyperglycemic regimen which includes: jardiance 10mg  daily, glipizide XL 10mg  daily, metformin 1000mg  bid. Denies low sugars or hypoglycemic symptoms. + paresthesias predominantly LUE - gabapentin helps. Last diabetic eye exam 06/2017. Pneumovax: 2014. Prevnar: 2017. Glucometer brand: one touch. DSME: declines - will discuss next visit.  Lab Results  Component Value Date   HGBA1C 6.7 (A) 11/18/2017   Diabetic Foot Exam - Simple   Simple Foot Form Diabetic Foot exam was performed with the following findings:  Yes 11/18/2017  9:57 AM  Visual Inspection No deformities, no ulcerations, no other skin breakdown bilaterally:  Yes Sensation Testing Intact to touch and monofilament testing bilaterally:  Yes Pulse Check Posterior Tibialis and Dorsalis pulse intact bilaterally:  Yes Comments    Lab Results  Component Value Date   MICROALBUR 3.2 (H) 06/16/2012     BP also running well controlled.   Relevant past medical, surgical, family and social history reviewed and updated as indicated. Interim medical history since our last visit reviewed. Allergies and medications reviewed and updated. Outpatient Medications Prior to Visit  Medication Sig Dispense Refill  . aspirin EC 81 MG tablet Take 81 mg by mouth daily.    Marland Kitchen atorvastatin (LIPITOR) 10 MG tablet TAKE 1 TABLET BY MOUTH  DAILY 90 tablet 2  . baclofen (LIORESAL) 10 MG tablet TAKE 1 TABLET BY MOUTH AT  BEDTIME AS NEEDED FOR BACK  SPASMS  90 tablet 1  . empagliflozin (JARDIANCE) 10 MG TABS tablet Take 10 mg by mouth daily. 90 tablet 3  . fluticasone (FLONASE) 50 MCG/ACT nasal spray USE 2 SPRAYS IN EACH  NOSTRIL DAILY 48 g 3  . glipiZIDE (GLUCOTROL XL) 10 MG 24 hr tablet TAKE 1 TABLET BY MOUTH  DAILY WITH BREAKFAST 90 tablet 2  . glucose blood (ONE TOUCH ULTRA TEST) test strip USE TO CHECK SUGAR TWICE  DAILY. 200 each 1  . losartan (COZAAR) 50 MG tablet Take 1 tablet (50 mg total) daily by mouth. 90 tablet 3  . Melatonin 5 MG TABS Take 1 tablet (5 mg total) by mouth at bedtime as needed (sleep). 90 tablet 1  . metFORMIN (GLUCOPHAGE) 1000 MG tablet Take 1 tablet (1,000 mg total) by mouth 2 (two) times daily with a meal.    . omeprazole (PRILOSEC) 40 MG capsule TAKE 1 CAPSULE BY MOUTH  DAILY 90 capsule 2  . oxyCODONE-acetaminophen (PERCOCET) 10-325 MG per tablet Take 1-2 tablets by mouth every 4 (four) hours as needed for pain. 60 tablet 0  . tamsulosin (FLOMAX) 0.4 MG CAPS capsule Take 1 capsule (0.4 mg total) by mouth daily after supper. 90 capsule 3  . gabapentin (NEURONTIN) 100 MG capsule Take 1 tablet in the morning and 2 at night, as tolerated 90 capsule 3  . naproxen (NAPROSYN) 375 MG tablet Take 1 tablet (375 mg total) by mouth  2 (two) times daily with a meal. 20 tablet 2  . sildenafil (VIAGRA) 100 MG tablet Take 0.5-1 tablets (50-100 mg total) by mouth daily as needed for erectile dysfunction. 5 tablet 3  . mupirocin ointment (BACTROBAN) 2 % Apply 1 application topically 2 (two) times daily. 30 g 0   No facility-administered medications prior to visit.      Per HPI unless specifically indicated in ROS section below Review of Systems     Objective:    BP 126/70 (BP Location: Left Arm, Patient Position: Sitting, Cuff Size: Normal)   Pulse 73   Temp 98.2 F (36.8 C) (Oral)   Ht 5\' 8"  (1.727 m)   Wt 238 lb (108 kg)   SpO2 98%   BMI 36.19 kg/m   Wt Readings from Last 3 Encounters:  11/18/17 238 lb (108 kg)    08/06/17 239 lb 8 oz (108.6 kg)  07/11/17 243 lb (110.2 kg)    Physical Exam  Constitutional: He appears well-developed and well-nourished. No distress.  HENT:  Head: Normocephalic and atraumatic.  Right Ear: External ear normal.  Left Ear: External ear normal.  Nose: Nose normal.  Mouth/Throat: Oropharynx is clear and moist. No oropharyngeal exudate.  Eyes: Pupils are equal, round, and reactive to light. Conjunctivae and EOM are normal. No scleral icterus.  Neck: Normal range of motion. Neck supple.  Cardiovascular: Normal rate, regular rhythm, normal heart sounds and intact distal pulses.  No murmur heard. Pulmonary/Chest: Effort normal and breath sounds normal. No respiratory distress. He has no wheezes. He has no rales.  Musculoskeletal: He exhibits no edema.  See HPI for foot exam if done Some popliteal fullness L>R FROM knee flexion/extension  Lymphadenopathy:    He has no cervical adenopathy.  Skin: Skin is warm and dry. No rash noted.  Psychiatric: He has a normal mood and affect.  Nursing note and vitals reviewed.  Results for orders placed or performed in visit on 11/18/17  POCT glycosylated hemoglobin (Hb A1C)  Result Value Ref Range   Hemoglobin A1C 6.7 (A) 4.0 - 5.6 %   HbA1c POC (<> result, manual entry)  4.0 - 5.6 %   HbA1c, POC (prediabetic range)  5.7 - 6.4 %   HbA1c, POC (controlled diabetic range)  0.0 - 7.0 %      Assessment & Plan:   Problem List Items Addressed This Visit    OSA (obstructive sleep apnea)    Not on cpap.      Diabetic neuropathy (HCC)    Continue gabapentin, decrease to 100mg  bid. Higher doses may cause dizziness.      Diabetes mellitus type 2, uncontrolled, with complications ([place]) - Primary    Chronic. Anticipate better control based on recall cbg's. Update A1c today. Continue current regimen. Encouraged he consider diabetes education classes. Declines at this time. Jardiance started earlier this year.       Relevant Orders    POCT glycosylated hemoglobin (Hb A1C) (Completed)       Meds ordered this encounter  Medications  . gabapentin (NEURONTIN) 100 MG capsule    Sig: Take 1 capsule (100 mg total) by mouth 2 (two) times daily.    Dispense:  180 capsule    Refill:  1   Orders Placed This Encounter  Procedures  . POCT glycosylated hemoglobin (Hb A1C)    Follow up plan: Return in about 4 months (around 03/21/2018) for annual exam, prior fasting for blood work, medicare wellness visit.  Ria Bush, MD

## 2017-11-18 NOTE — Assessment & Plan Note (Signed)
Not on cpap  ?

## 2017-11-29 ENCOUNTER — Ambulatory Visit: Payer: Medicare Other | Admitting: Podiatry

## 2017-11-29 ENCOUNTER — Encounter: Payer: Self-pay | Admitting: Podiatry

## 2017-11-29 DIAGNOSIS — E1142 Type 2 diabetes mellitus with diabetic polyneuropathy: Secondary | ICD-10-CM

## 2017-11-29 DIAGNOSIS — M79674 Pain in right toe(s): Secondary | ICD-10-CM

## 2017-11-29 DIAGNOSIS — M79675 Pain in left toe(s): Secondary | ICD-10-CM | POA: Diagnosis not present

## 2017-11-29 DIAGNOSIS — B351 Tinea unguium: Secondary | ICD-10-CM

## 2017-11-29 NOTE — Progress Notes (Signed)
Complaint:  Visit Type: Patient returns to my office for continued preventative foot care services. Complaint: Patient states" my nails have grown long and thick and become painful to walk and wear shoes" Patient has been diagnosed with DM with foot neuropathy. The patient presents for preventative foot care services. No changes to ROS  Podiatric Exam: Vascular: dorsalis pedis and posterior tibial pulses are palpable bilateral. Capillary return is immediate. Temperature gradient is WNL. Skin turgor WNL  Sensorium: Normal Semmes Weinstein monofilament test. Normal tactile sensation bilaterally. Nail Exam: Pt has thick disfigured discolored nails with subungual debris noted bilateral entire nail hallux through fifth toenails Ulcer Exam: There is no evidence of ulcer or pre-ulcerative changes or infection. Orthopedic Exam: Muscle tone and strength are WNL. No limitations in general ROM. No crepitus or effusions noted. Foot type and digits show no abnormalities. Bony prominences are unremarkable. Skin: No Porokeratosis. No infection or ulcers  Diagnosis:  Onychomycosis, , Pain in right toe, pain in left toes  Treatment & Plan Procedures and Treatment: Consent by patient was obtained for treatment procedures.   Debridement of mycotic and hypertrophic toenails, 1 through 5 bilateral and clearing of subungual debris. No ulceration, no infection noted.  Return Visit-Office Procedure: Patient instructed to return to the office for a follow up visit 3 months for continued evaluation and treatment.    Gardiner Barefoot DPM

## 2017-12-04 ENCOUNTER — Other Ambulatory Visit: Payer: Self-pay | Admitting: Family Medicine

## 2017-12-04 NOTE — Telephone Encounter (Signed)
Naproxen Last filled:  06/17/17, #60 Last OV:  11/18/17, f/u Next OV:  none

## 2018-01-16 DIAGNOSIS — M4802 Spinal stenosis, cervical region: Secondary | ICD-10-CM | POA: Diagnosis not present

## 2018-01-20 ENCOUNTER — Telehealth: Payer: Self-pay | Admitting: Family Medicine

## 2018-01-20 NOTE — Telephone Encounter (Signed)
Electronic refill request Last office visit 11/18/17 Naprosyn last refill 12/04/17 #60 Baclofen last refill 07/19/17 #90/1

## 2018-01-23 ENCOUNTER — Other Ambulatory Visit: Payer: Self-pay

## 2018-01-23 NOTE — Telephone Encounter (Signed)
Gabapentin Last rx:  11/18/17, #180 Last OV:  11/18/17, f/u Next OV:  none

## 2018-01-25 MED ORDER — GABAPENTIN 100 MG PO CAPS: 100.0000 mg | ORAL_CAPSULE | Freq: Two times a day (BID) | ORAL | 1 refills | Status: DC

## 2018-02-03 NOTE — Telephone Encounter (Signed)
Pt called and said he is out of his meds and [facility] did not receive any refills on his meds recently. I spoke with Richardson Landry at [facility] and did receive naproxen and baclophen refills; Richardson Landry not sure why was not shipped but will get ready to send out those 2 meds. The metformin and losartan are too early to ship; last shipped 90 days on each on 12/05/17. Richardson Landry said he can ship the metformin and losartan on 12/06/17. I advised pt of what Richardson Landry at [facility] said and pt said he is short of funds and cannot pick up meds at local pharmacy; pt will wait on mail order meds. Pt will cb if needed.

## 2018-02-06 NOTE — Telephone Encounter (Signed)
This makes no sense that losartan costs that much.  Is it on back order and is that the reason? plz call [facility] to clafiry.

## 2018-02-06 NOTE — Telephone Encounter (Signed)
Is he willing to fill generic?

## 2018-02-06 NOTE — Telephone Encounter (Signed)
Spoke with [facility] asking about the losartan rx.  Told the pt is requesting the brand name, Cozaar, which is not covered by his insurance and that's where he was given the cost of $400.

## 2018-02-06 NOTE — Telephone Encounter (Signed)
Pt is calling to advise he spoke with [facility] and they advise him the medication- Losartan is on hold due to the insurance not covering the medication. He stated the medication is 400$ and he is needing something different called in. Please Advise.

## 2018-02-07 NOTE — Telephone Encounter (Signed)
Spoke with pt asking if he is ok getting the generic losartan, says yes. He wants whatever his insurance will cover.

## 2018-02-07 NOTE — Telephone Encounter (Signed)
Spoke with [facility] informing them the pt has agreed to the generic losartan as Dr. Darnell Level prescribed.  Per a pharmacist with [facility], the pt has to call them to let them know he now wants the generic and then they will send it to him.  Spoke with the pt relaying message from [facility].  Says he will call them and let them know. Expresses his thanks for the help.

## 2018-02-07 NOTE — Telephone Encounter (Signed)
plz call [facility] saying ok to fill generic - this should be fine as I didn't specify dispense only brand when I sent refill in last month.

## 2018-02-08 DIAGNOSIS — M961 Postlaminectomy syndrome, not elsewhere classified: Secondary | ICD-10-CM | POA: Diagnosis not present

## 2018-02-12 ENCOUNTER — Other Ambulatory Visit: Payer: Self-pay | Admitting: Family Medicine

## 2018-02-12 NOTE — Telephone Encounter (Signed)
Rx request- please review for refill

## 2018-02-12 NOTE — Telephone Encounter (Signed)
Name of Medication: viagra 100 mg Name of Pharmacy[facility]  Last Fill or Written Date and Quantity: # 5 x 3 on 08/01/2015 Last Office Visit and Type: 11/18/17 4 mth f/u Next Office Visit and Type: no future appt scheduled.

## 2018-02-12 NOTE — Telephone Encounter (Signed)
Copied from [place] [phone]. Topic: Quick Communication - Rx Refill/Question >> Feb 12, 2018  8:12 AM Carolyn Stare wrote: Medication   sildenafil (VIAGRA) 100 MG tablet  Has the patient contacted their pharmacy yes     Preferred Pharmacy    [facility]   Agent: Please be advised that RX refills may take up to 3 business days. We ask that you follow-up with your pharmacy.

## 2018-02-13 ENCOUNTER — Telehealth: Payer: Self-pay | Admitting: Family Medicine

## 2018-02-13 MED ORDER — SILDENAFIL CITRATE 100 MG PO TABS: 50.0000 mg | ORAL_TABLET | Freq: Every day | ORAL | 3 refills | Status: DC | PRN

## 2018-02-13 NOTE — Telephone Encounter (Signed)
Spoke with [facility] about the metformin. Told the fax is a mistake and was sent by mistake.  Says they will fix it and fill rx for the pt. Fyi to Dr. Darnell Level.

## 2018-02-13 NOTE — Telephone Encounter (Signed)
Received notice that metformin 1000mg  was discontinued by manufacturer. This makes no sense. Please call [facility] to verify what the issue is. Patient is on immediate release metformin 1000mg  bid. Do we need to change this??

## 2018-03-10 ENCOUNTER — Other Ambulatory Visit: Payer: Self-pay | Admitting: Internal Medicine

## 2018-03-11 ENCOUNTER — Other Ambulatory Visit: Payer: Self-pay | Admitting: Internal Medicine

## 2018-03-28 ENCOUNTER — Other Ambulatory Visit: Payer: Self-pay | Admitting: Family Medicine

## 2018-04-03 ENCOUNTER — Other Ambulatory Visit: Payer: Self-pay

## 2018-04-03 MED ORDER — METFORMIN HCL 1000 MG PO TABS: ORAL_TABLET | ORAL | 0 refills | Status: DC

## 2018-04-03 NOTE — Telephone Encounter (Signed)
Escribed

## 2018-04-08 ENCOUNTER — Telehealth: Payer: Self-pay

## 2018-04-08 NOTE — Telephone Encounter (Signed)
[name] with [employer] is faxing to [phone] a request for PA for Gabapentin. Ref # [id number].

## 2018-04-17 NOTE — Telephone Encounter (Addendum)
Spoke with [facility] informing them we have not received PA form for gabapentin.  Told they will fax it again, including one for baclofen.  Ref #:  [id number] for baclofen and ref #:  [id number] for gabapentin.  Per Rolan Bucco, cust serv rep, this is a Scientist, forensic Req form.  He says they will need to be completed and faxed to [phone], unless there is another fax # on the forms.

## 2018-04-18 NOTE — Telephone Encounter (Signed)
Completed and faxed forms.

## 2018-04-18 NOTE — Telephone Encounter (Signed)
Received faxed denial Tier Cost Sharing for baclofen and gabapentin.  Reason given, neither drug qualifies for a lower copay or tier exception.

## 2018-04-21 NOTE — Telephone Encounter (Signed)
No answer, did not see a DPR.

## 2018-04-21 NOTE — Telephone Encounter (Signed)
plz notify pt. We did what we could do.

## 2018-04-22 NOTE — Telephone Encounter (Signed)
Attempted to contact pt.  No answer.  Vm box is full.  Need to relay Dr. G's message.  

## 2018-04-25 NOTE — Telephone Encounter (Signed)
Attempted to contact pt. No answer. Vm box is full. Mailing letter.   Need to relay Dr. Synthia Innocent message.

## 2018-04-27 ENCOUNTER — Other Ambulatory Visit: Payer: Self-pay | Admitting: Internal Medicine

## 2018-05-05 DIAGNOSIS — M961 Postlaminectomy syndrome, not elsewhere classified: Secondary | ICD-10-CM | POA: Diagnosis not present

## 2018-05-12 ENCOUNTER — Other Ambulatory Visit: Payer: Self-pay | Admitting: Family Medicine

## 2018-05-12 DIAGNOSIS — E7800 Pure hypercholesterolemia, unspecified: Secondary | ICD-10-CM

## 2018-05-12 NOTE — Telephone Encounter (Signed)
Last office visit 11/18/2017 for DM.  Last refilled 06/03/2017 for #90 with 2 refills.  Last Lipid 03/04/2017.  No future appointments.  Ok to refill?

## 2018-06-03 DIAGNOSIS — H40013 Open angle with borderline findings, low risk, bilateral: Secondary | ICD-10-CM | POA: Diagnosis not present

## 2018-06-03 LAB — HM DIABETES EYE EXAM

## 2018-06-06 ENCOUNTER — Encounter: Payer: Self-pay | Admitting: Podiatry

## 2018-06-06 ENCOUNTER — Ambulatory Visit: Payer: Medicare Other | Admitting: Podiatry

## 2018-06-06 DIAGNOSIS — M79674 Pain in right toe(s): Secondary | ICD-10-CM | POA: Diagnosis not present

## 2018-06-06 DIAGNOSIS — B351 Tinea unguium: Secondary | ICD-10-CM

## 2018-06-06 DIAGNOSIS — E1142 Type 2 diabetes mellitus with diabetic polyneuropathy: Secondary | ICD-10-CM | POA: Diagnosis not present

## 2018-06-06 DIAGNOSIS — M79675 Pain in left toe(s): Secondary | ICD-10-CM

## 2018-06-06 NOTE — Progress Notes (Signed)
Complaint:  °Visit Type: Patient returns to my office for continued preventative foot care services. Complaint: Patient states" my nails have grown long and thick and become painful to walk and wear shoes" Patient has been diagnosed with DM with foot neuropathy. The patient presents for preventative foot care services. No changes to ROS ° °Podiatric Exam: °Vascular: dorsalis pedis and posterior tibial pulses are palpable bilateral. Capillary return is immediate. Temperature gradient is WNL. Skin turgor WNL  °Sensorium: Normal Semmes Weinstein monofilament test. Normal tactile sensation bilaterally. °Nail Exam: Pt has thick disfigured discolored nails with subungual debris noted bilateral entire nail hallux through fifth toenails °Ulcer Exam: There is no evidence of ulcer or pre-ulcerative changes or infection. °Orthopedic Exam: Muscle tone and strength are WNL. No limitations in general ROM. No crepitus or effusions noted. Foot type and digits show no abnormalities. Bony prominences are unremarkable. °Skin: No Porokeratosis. No infection or ulcers ° °Diagnosis:  °Onychomycosis, , Pain in right toe, pain in left toes ° °Treatment & Plan °Procedures and Treatment: Consent by patient was obtained for treatment procedures.   Debridement of mycotic and hypertrophic toenails, 1 through 5 bilateral and clearing of subungual debris. No ulceration, no infection noted.  °Return Visit-Office Procedure: Patient instructed to return to the office for a follow up visit 4 months   for continued evaluation and treatment. ° ° ° °Jovanka Westgate DPM °

## 2018-06-22 ENCOUNTER — Other Ambulatory Visit: Payer: Self-pay | Admitting: Family Medicine

## 2018-06-23 NOTE — Telephone Encounter (Signed)
Electronic refill request Gabapentin Last office visit 11/18/17 Last refill 01/25/18 #180/1

## 2018-07-09 ENCOUNTER — Telehealth: Payer: Self-pay

## 2018-07-09 NOTE — Telephone Encounter (Signed)
[facility] Night - Client Nonclinical Telephone Record [facility] [facility] - Client Client Site [place] Physician Ria Bush - MD Contact Type Call Who Is Calling Patient / Member / Family / Caregiver Caller Name [name] Phone Number [phone] Patient Name [name] Patient DOB 22-Apr-1951 Call Type Message Only Information Provided Reason for Call Request for General Office Information Initial Comment .Caller is wanting to know if they are seeing patients Additional Comment Call Closed By: Felecia Jan Transaction Date/Time: 07/09/2018 7:16:40 AM (ET)

## 2018-07-09 NOTE — Telephone Encounter (Signed)
I spoke with pt; pt has not traveled or been exposed to known person with covid or flu. Pt has no symptoms but thought needed to schedule ck. Pt last seen 10/2017 and was to schedule annual 02/2018. Robin scheduled pt annual exam 10/28/18;FYI to Dr Darnell Level.

## 2018-07-09 NOTE — Telephone Encounter (Signed)
Noted. Thanks.

## 2018-07-19 ENCOUNTER — Telehealth: Payer: Self-pay | Admitting: Family Medicine

## 2018-07-28 ENCOUNTER — Ambulatory Visit (INDEPENDENT_AMBULATORY_CARE_PROVIDER_SITE_OTHER): Payer: Medicare Other | Admitting: Family Medicine

## 2018-07-28 ENCOUNTER — Encounter: Payer: Self-pay | Admitting: Family Medicine

## 2018-07-28 ENCOUNTER — Other Ambulatory Visit: Payer: Self-pay

## 2018-07-28 VITALS — BP 140/78 | HR 98 | Temp 98.4°F | Ht 68.0 in | Wt 241.2 lb

## 2018-07-28 DIAGNOSIS — R109 Unspecified abdominal pain: Secondary | ICD-10-CM | POA: Insufficient documentation

## 2018-07-28 DIAGNOSIS — E1165 Type 2 diabetes mellitus with hyperglycemia: Secondary | ICD-10-CM | POA: Diagnosis not present

## 2018-07-28 DIAGNOSIS — E118 Type 2 diabetes mellitus with unspecified complications: Secondary | ICD-10-CM

## 2018-07-28 DIAGNOSIS — IMO0002 Reserved for concepts with insufficient information to code with codable children: Secondary | ICD-10-CM

## 2018-07-28 DIAGNOSIS — R310 Gross hematuria: Secondary | ICD-10-CM | POA: Diagnosis not present

## 2018-07-28 DIAGNOSIS — M79601 Pain in right arm: Secondary | ICD-10-CM | POA: Diagnosis not present

## 2018-07-28 LAB — COMPREHENSIVE METABOLIC PANEL
ALT: 20 U/L (ref 0–53)
AST: 15 U/L (ref 0–37)
Albumin: 4.4 g/dL (ref 3.5–5.2)
Alkaline Phosphatase: 52 U/L (ref 39–117)
BUN: 11 mg/dL (ref 6–23)
CO2: 27 mEq/L (ref 19–32)
Calcium: 9.4 mg/dL (ref 8.4–10.5)
Chloride: 101 mEq/L (ref 96–112)
Creatinine, Ser: 0.89 mg/dL (ref 0.40–1.50)
GFR: 102.72 mL/min (ref >60.00)
Glucose, Bld: 210 mg/dL — ABNORMAL HIGH (ref 70–99)
Potassium: 4 mEq/L (ref 3.5–5.1)
Sodium: 137 mEq/L (ref 135–145)
Total Bilirubin: 0.3 mg/dL (ref 0.2–1.2)
Total Protein: 7.6 g/dL (ref 6.0–8.3)

## 2018-07-28 LAB — CBC WITH DIFFERENTIAL/PLATELET
Basophils Absolute: 0.1 10*3/uL (ref 0.0–0.1)
Basophils Relative: 1.2 % (ref 0.0–3.0)
Eosinophils Absolute: 0.1 10*3/uL (ref 0.0–0.7)
Eosinophils Relative: 2.7 % (ref 0.0–5.0)
HCT: 40.3 % (ref 39.0–52.0)
Hemoglobin: 13.9 g/dL (ref 13.0–17.0)
Lymphocytes Relative: 34.2 % (ref 12.0–46.0)
Lymphs Abs: 1.6 10*3/uL (ref 0.7–4.0)
MCHC: 34.4 g/dL (ref 30.0–36.0)
MCV: 89.9 fl (ref 78.0–100.0)
Monocytes Absolute: 0.8 10*3/uL (ref 0.1–1.0)
Monocytes Relative: 16.1 % — ABNORMAL HIGH (ref 3.0–12.0)
Neutro Abs: 2.2 10*3/uL (ref 1.4–7.7)
Neutrophils Relative %: 45.8 % (ref 43.0–77.0)
Platelets: 279 10*3/uL (ref 150.0–400.0)
RBC: 4.48 Mil/uL (ref 4.22–5.81)
RDW: 14.3 % (ref 11.5–15.5)
WBC: 4.8 10*3/uL (ref 4.0–10.5)

## 2018-07-28 LAB — POC URINALSYSI DIPSTICK (AUTOMATED)
Bilirubin, UA: NEGATIVE
Blood, UA: NEGATIVE
Glucose, UA: POSITIVE — AB
Ketones, UA: NEGATIVE
Leukocytes, UA: NEGATIVE
Nitrite, UA: NEGATIVE
Protein, UA: POSITIVE — AB
Spec Grav, UA: 1.015 (ref 1.010–1.025)
Urobilinogen, UA: 0.2 E.U./dL
pH, UA: 6 (ref 5.0–8.0)

## 2018-07-28 LAB — HEMOGLOBIN A1C: Hgb A1c MFr Bld: 9.2 % — ABNORMAL HIGH (ref 4.6–6.5)

## 2018-07-28 MED ORDER — NAPROXEN 375 MG PO TABS: ORAL_TABLET | ORAL | 0 refills | Status: DC

## 2018-07-28 MED ORDER — METFORMIN HCL 1000 MG PO TABS: ORAL_TABLET | ORAL | 0 refills | Status: DC

## 2018-07-28 MED ORDER — GABAPENTIN 100 MG PO CAPS: ORAL_CAPSULE | ORAL | 1 refills | Status: DC

## 2018-07-28 MED ORDER — BACLOFEN 10 MG PO TABS: ORAL_TABLET | ORAL | 1 refills | Status: DC

## 2018-07-28 NOTE — Telephone Encounter (Signed)
Sent refills to [facility] per Dr. Coralie Keens TE, 07/28/18.]

## 2018-07-28 NOTE — Assessment & Plan Note (Addendum)
Overdue for f/u. Check A1c today. Metformin refilled.

## 2018-07-28 NOTE — Telephone Encounter (Signed)
E-scribed refills to [facility]. [See TE, 07/19/18.]

## 2018-07-28 NOTE — Assessment & Plan Note (Signed)
UA today without hematuria.

## 2018-07-28 NOTE — Progress Notes (Signed)
BP 140/78 (BP Location: Left Arm, Patient Position: Sitting, Cuff Size: Normal)   Pulse 98   Temp 98.4 F (36.9 C) (Oral)   Ht 5\' 8"  (1.727 m)   Wt 241 lb 3 oz (109.4 kg)   SpO2 96%   BMI 36.67 kg/m    CC: R sided pain Subjective:    Patient ID: [name], male    DOB: August 05, 1950, 68 y.o.   MRN: [id number]  HPI: TAYRON HUNNELL is a 68 y.o. male presenting on 07/28/2018 for Flank Pain (C/o right flank pain, worse in AM. Started about 2 mos ago. Thought he pulled a muscle but pain is worsening. ) and Arm Swelling (C/o right arm swelling and mild pain when getting up in the morning. Started mos ago. H/o right shoulder surgery. )    2 mo h/o R sided sharp flank pain worse in the mornings, worse with certain movements. Off and on R hand, forearm swelling. R handed.   No fevers/chills, nausea/vomiting, bowel changes (diarrhea, constipation, blood in stool), urinary changes or blood in urine.   Mucinex has helped night time drainage - continues this BID scheduled.      Relevant past medical, surgical, family and social history reviewed and updated as indicated. Interim medical history since our last visit reviewed. Allergies and medications reviewed and updated. Outpatient Medications Prior to Visit  Medication Sig Dispense Refill  . aspirin EC 81 MG tablet Take 81 mg by mouth daily.    Marland Kitchen atorvastatin (LIPITOR) 10 MG tablet TAKE 1 TABLET BY MOUTH  DAILY 90 tablet 1  . empagliflozin (JARDIANCE) 10 MG TABS tablet Take 10 mg by mouth daily. 90 tablet 3  . fluticasone (FLONASE) 50 MCG/ACT nasal spray USE 2 SPRAYS IN EACH  NOSTRIL DAILY 48 g 3  . glipiZIDE (GLUCOTROL XL) 10 MG 24 hr tablet TAKE 1 TABLET BY MOUTH  DAILY WITH BREAKFAST 90 tablet 2  . glucose blood (ONE TOUCH ULTRA TEST) test strip USE WITH METER TO CHECK  SUGAR TWICE DAILY. 200 each 4  . losartan (COZAAR) 50 MG tablet TAKE 1 TABLET BY MOUTH  DAILY 90 tablet 0  . Melatonin 5 MG TABS Take 1 tablet (5 mg total) by  mouth at bedtime as needed (sleep). 90 tablet 1  . omeprazole (PRILOSEC) 40 MG capsule TAKE 1 CAPSULE BY MOUTH  DAILY 90 capsule 2  . oxyCODONE-acetaminophen (PERCOCET) 10-325 MG tablet oxycodone-acetaminophen 10 mg-325 mg tablet  TK 1 T PO Q 4 H PRN    . sildenafil (VIAGRA) 100 MG tablet Take 0.5-1 tablets (50-100 mg total) by mouth daily as needed for erectile dysfunction. 5 tablet 3  . tamsulosin (FLOMAX) 0.4 MG CAPS capsule Take 1 capsule (0.4 mg total) by mouth daily after supper. 90 capsule 3  . UNABLE TO FIND mupirocin 2 % topical ointment    . baclofen (LIORESAL) 10 MG tablet TAKE 1 TABLET BY MOUTH AT  BEDTIME AS NEEDED FOR BACK  SPASMS 90 tablet 1  . gabapentin (NEURONTIN) 100 MG capsule TAKE 1 CAPSULE BY MOUTH TWO TIMES DAILY 180 capsule 1  . metFORMIN (GLUCOPHAGE) 1000 MG tablet TAKE 1 TABLET BY MOUTH 2  TIMES DAILY WITH MEALS 180 tablet 0  . naproxen (NAPROSYN) 375 MG tablet TAKE 1 TABLET BY MOUTH TWO  TIMES DAILY WITH A MEAL 120 tablet 0  . levofloxacin (LEVAQUIN) 750 MG tablet levofloxacin 750 mg tablet     No facility-administered medications prior to visit.  Per HPI unless specifically indicated in ROS section below Review of Systems Objective:    BP 140/78 (BP Location: Left Arm, Patient Position: Sitting, Cuff Size: Normal)   Pulse 98   Temp 98.4 F (36.9 C) (Oral)   Ht 5\' 8"  (1.727 m)   Wt 241 lb 3 oz (109.4 kg)   SpO2 96%   BMI 36.67 kg/m   Wt Readings from Last 3 Encounters:  07/28/18 241 lb 3 oz (109.4 kg)  11/18/17 238 lb (108 kg)  08/06/17 239 lb 8 oz (108.6 kg)    Physical Exam Vitals signs and nursing note reviewed.  Constitutional:      Appearance: Normal appearance. He is not ill-appearing.  HENT:     Mouth/Throat:     Mouth: Mucous membranes are moist.     Pharynx: No posterior oropharyngeal erythema.  Cardiovascular:     Rate and Rhythm: Normal rate and regular rhythm.     Pulses: Normal pulses.     Heart sounds: Normal heart sounds. No  murmur.  Pulmonary:     Effort: Pulmonary effort is normal. No respiratory distress.     Breath sounds: Normal breath sounds. No wheezing, rhonchi or rales.  Abdominal:     General: Abdomen is flat. Bowel sounds are normal. There is no distension.     Palpations: Abdomen is soft. There is no mass.     Tenderness: There is abdominal tenderness (R sided abd discomfort to palpation just below ribcage). There is no right CVA tenderness, left CVA tenderness, guarding or rebound. Negative signs include Murphy's sign.     Hernia: No hernia is present.  Musculoskeletal:     Right lower leg: No edema.     Left lower leg: No edema.     Comments: 2+ rad pulses bilaterally  Neurological:     Mental Status: He is alert.  Psychiatric:        Mood and Affect: Mood normal.        Behavior: Behavior normal.       Results for orders placed or performed in visit on 07/28/18  POCT Urinalysis Dipstick (Automated)  Result Value Ref Range   Color, UA yellow    Clarity, UA clear    Glucose, UA Positive (A) Negative   Bilirubin, UA negative    Ketones, UA negative    Spec Grav, UA 1.015 1.010 - 1.025   Blood, UA negative    pH, UA 6.0 5.0 - 8.0   Protein, UA Positive (A) Negative   Urobilinogen, UA 0.2 0.2 or 1.0 E.U./dL   Nitrite, UA negatitve    Leukocytes, UA Negative Negative   Assessment & Plan:   Problem List Items Addressed This Visit    Right sided abdominal pain - Primary    Overall benign exam. UA with glu and protein, no blood or signs of infection. ?MSK vs other cause. Will check labs today (CBC, CMP) and order abd [place] for further evaluation (check liver, gallbladder, R kidney). Pt agrees with plan. Known liver hemangioma.      Relevant Orders   POCT Urinalysis Dipstick (Automated) (Completed)   Comprehensive metabolic panel   CBC with Differential/Platelet   US Abdomen Complete   Right arm pain    Endorses 1+ mo R arm pain/swelling without evident swelling on exam. Will monitor  for now. Not consistent with DVT.       Gross hematuria    UA today without hematuria.       Diabetes mellitus  type 2, uncontrolled, with complications ([place])    Overdue for f/u. Check A1c today. Metformin refilled.       Relevant Medications   metFORMIN (GLUCOPHAGE) 1000 MG tablet   Other Relevant Orders   Hemoglobin A1c       Meds ordered this encounter  Medications  . baclofen (LIORESAL) 10 MG tablet    Sig: TAKE 1 TABLET BY MOUTH AT  BEDTIME AS NEEDED FOR BACK  SPASMS    Dispense:  90 tablet    Refill:  1  . gabapentin (NEURONTIN) 100 MG capsule    Sig: TAKE 1 CAPSULE BY MOUTH TWO TIMES DAILY    Dispense:  180 capsule    Refill:  1  . metFORMIN (GLUCOPHAGE) 1000 MG tablet    Sig: TAKE 1 TABLET BY MOUTH 2  TIMES DAILY WITH MEALS    Dispense:  180 tablet    Refill:  0  . naproxen (NAPROSYN) 375 MG tablet    Sig: TAKE 1 TABLET BY MOUTH TWO  TIMES DAILY as needed WITH A MEAL    Dispense:  120 tablet    Refill:  0   Orders Placed This Encounter  Procedures  . US Abdomen Complete    Standing Status:   Future    Standing Expiration Date:   09/27/2019    Order Specific Question:   Reason for Exam (SYMPTOM  OR DIAGNOSIS REQUIRED)    Answer:   R sided abd pain    Order Specific Question:   Preferred imaging location?    Answer:   [facility]  . Comprehensive metabolic panel  . Hemoglobin A1c  . CBC with Differential/Platelet  . POCT Urinalysis Dipstick (Automated)    Patient Instructions  Schedule physical/wellness visit in 2 months.  Urine test today.  Labs today. We will also order abdominal ultrasound to further evaluate R sided abdominal pain  We will request latest diabetic eye exam.    Follow up plan: No follow-ups on file.  Ria Bush, MD

## 2018-07-28 NOTE — Assessment & Plan Note (Signed)
Endorses 1+ mo R arm pain/swelling without evident swelling on exam. Will monitor for now. Not consistent with DVT.

## 2018-07-28 NOTE — Telephone Encounter (Signed)
Pt called concerning medication. He said this medication needs to be sent to [facility] mail order pharmacy for financial reasons. He is asking for it to be resent.

## 2018-07-28 NOTE — Patient Instructions (Addendum)
Schedule physical/wellness visit in 2 months.  Urine test today.  Labs today. We will also order abdominal ultrasound to further evaluate R sided abdominal pain  We will request latest diabetic eye exam.

## 2018-07-28 NOTE — Assessment & Plan Note (Addendum)
Overall benign exam. UA with glu and protein, no blood or signs of infection. ?MSK vs other cause. Will check labs today (CBC, CMP) and order abd [place] for further evaluation (check liver, gallbladder, R kidney). Pt agrees with plan. Known liver hemangioma.

## 2018-07-29 ENCOUNTER — Other Ambulatory Visit: Payer: Self-pay | Admitting: Family Medicine

## 2018-07-29 MED ORDER — EMPAGLIFLOZIN 25 MG PO TABS: 25.0000 mg | ORAL_TABLET | Freq: Every day | ORAL | 2 refills | Status: DC

## 2018-07-30 ENCOUNTER — Other Ambulatory Visit: Payer: Self-pay | Admitting: Family Medicine

## 2018-07-30 MED ORDER — EMPAGLIFLOZIN 10 MG PO TABS: 10.0000 mg | ORAL_TABLET | Freq: Every day | ORAL | 2 refills | Status: DC

## 2018-07-31 ENCOUNTER — Other Ambulatory Visit: Payer: Self-pay | Admitting: Family Medicine

## 2018-07-31 ENCOUNTER — Encounter: Payer: Self-pay | Admitting: Family Medicine

## 2018-07-31 ENCOUNTER — Other Ambulatory Visit: Payer: Self-pay

## 2018-07-31 ENCOUNTER — Telehealth: Payer: Self-pay | Admitting: Family Medicine

## 2018-07-31 ENCOUNTER — Ambulatory Visit
Admission: RE | Admit: 2018-07-31 | Discharge: 2018-07-31 | Disposition: A | Discharge status: Home / Self Care | Payer: Medicare Other | Source: Ambulatory Visit | Attending: Family Medicine | Admitting: Family Medicine

## 2018-07-31 DIAGNOSIS — R1907 Generalized intra-abdominal and pelvic swelling, mass and lump: Secondary | ICD-10-CM

## 2018-07-31 DIAGNOSIS — R109 Unspecified abdominal pain: Secondary | ICD-10-CM

## 2018-07-31 DIAGNOSIS — K760 Fatty (change of) liver, not elsewhere classified: Secondary | ICD-10-CM | POA: Insufficient documentation

## 2018-07-31 DIAGNOSIS — R1011 Right upper quadrant pain: Secondary | ICD-10-CM | POA: Diagnosis not present

## 2018-07-31 DIAGNOSIS — K8689 Other specified diseases of pancreas: Secondary | ICD-10-CM | POA: Insufficient documentation

## 2018-07-31 HISTORY — DX: Fatty (change of) liver, not elsewhere classified: K76.0

## 2018-07-31 NOTE — Telephone Encounter (Signed)
Called patient and scheduled MRI Abdomen for next Wednesday Patient is claustrophobic and asked me to request something to take for his MRI. Please send in to his pharmacy.

## 2018-08-04 MED ORDER — DIAZEPAM 2 MG PO TABS: 2.0000 mg | ORAL_TABLET | Freq: Two times a day (BID) | ORAL | 0 refills | Status: DC | PRN

## 2018-08-04 NOTE — Telephone Encounter (Addendum)
Spoke with pt relaying Dr. G's message.  Pt verbalizes understanding and expresses his thanks.  

## 2018-08-04 NOTE — Telephone Encounter (Signed)
Valium sent to local pharmacy for MRI.  Would recommend he have driver take him.

## 2018-08-05 ENCOUNTER — Other Ambulatory Visit: Payer: Self-pay

## 2018-08-05 ENCOUNTER — Ambulatory Visit (INDEPENDENT_AMBULATORY_CARE_PROVIDER_SITE_OTHER): Payer: Medicare Other | Admitting: Family Medicine

## 2018-08-05 ENCOUNTER — Encounter: Payer: Self-pay | Admitting: Family Medicine

## 2018-08-05 ENCOUNTER — Telehealth: Payer: Self-pay

## 2018-08-05 VITALS — BP 138/78 | HR 97 | Temp 98.2°F | Ht 68.0 in | Wt 237.0 lb

## 2018-08-05 DIAGNOSIS — R21 Rash and other nonspecific skin eruption: Secondary | ICD-10-CM

## 2018-08-05 DIAGNOSIS — E118 Type 2 diabetes mellitus with unspecified complications: Secondary | ICD-10-CM | POA: Diagnosis not present

## 2018-08-05 DIAGNOSIS — K8689 Other specified diseases of pancreas: Secondary | ICD-10-CM

## 2018-08-05 DIAGNOSIS — E1165 Type 2 diabetes mellitus with hyperglycemia: Secondary | ICD-10-CM | POA: Diagnosis not present

## 2018-08-05 DIAGNOSIS — K148 Other diseases of tongue: Secondary | ICD-10-CM | POA: Diagnosis not present

## 2018-08-05 DIAGNOSIS — IMO0002 Reserved for concepts with insufficient information to code with codable children: Secondary | ICD-10-CM

## 2018-08-05 HISTORY — DX: Other diseases of tongue: K14.8

## 2018-08-05 MED ORDER — NAPROXEN 375 MG PO TABS: ORAL_TABLET | ORAL | 0 refills | Status: DC

## 2018-08-05 MED ORDER — TRIAMCINOLONE ACETONIDE 0.1 % EX CREA: 1.0000 "application " | TOPICAL_CREAM | Freq: Two times a day (BID) | CUTANEOUS | 0 refills | Status: DC

## 2018-08-05 MED ORDER — DIAZEPAM 2 MG PO TABS: 2.0000 mg | ORAL_TABLET | Freq: Two times a day (BID) | ORAL | 0 refills | Status: DC | PRN

## 2018-08-05 NOTE — Telephone Encounter (Signed)
[facility] Patient Name: [name] Gender: Male DOB: 11-20-1950 Age: 68 Y 53 M 20 D Return Phone Number: [phone] (Primary) Address: City/State/Zip: [place] [address] Client [facility] Night - Client Client Site [place] Physician Ria Bush - MD Contact Type Call Who Is Calling Patient / Member / Family / Caregiver Call Type Triage / Clinical Relationship To Patient Self Return Phone Number [phone] (Primary) Chief Complaint Mouth Symptoms Reason for Call Symptomatic / Request for [name] states that she needs to come in for an appointment today if possible. She has a painful bump under her tongue. Translation No Nurse Assessment Nurse: Thurmond Butts, RN, Kirke Shaggy Date/Time (Eastern Time): 08/05/2018 7:10:54 AM Confirm and document reason for call. If symptomatic, describe symptoms. ---Painful bump started under tongue and now has stared to spread, hurts when eating/drinking. No drainage, no fever, does feel like there is a film / slime in mouth. Has the patient had close contact with a person known or suspected to have the novel coronavirus illness OR traveled / lives in area with major community spread (including international travel) in the last 14 days from the onset of symptoms? * If Asymptomatic, screen for exposure and travel within the last 14 days. ---No Does the patient have any new or worsening symptoms? ---Yes Will a triage be completed? ---Yes Related visit to physician within the last 2 weeks? ---Yes Does the PT have any chronic conditions? (i.e. diabetes, asthma, this includes High risk factors for pregnancy, etc.) ---Yes List chronic conditions. ---Pain medication diabetic medication cholesterol HTN Is this a behavioral health or substance abuse call?  ---No Guidelines Guideline Title Affirmed Question Affirmed Notes Nurse Date/Time (Eastern Time) Mouth Symptoms White patches that stick to tongue or inner Glade Stanford, RN, Kirke Shaggy 08/05/2018 7:12:15 AM Disp. Time [name] Time) Disposition Final User 08/05/2018 7:16:15 AM SEE PCP WITHIN 3 DAYS Yes Thurmond Butts, RN, Kirke Shaggy PLEASE NOTE: All timestamps contained within this report are represented as [place] Standard Time. CONFIDENTIALTY NOTICE: This fax transmission is intended only for the addressee. It contains information that is legally privileged, confidential or otherwise protected from use or disclosure. If you are not the intended recipient, you are strictly prohibited from reviewing, disclosing, copying using or disseminating any of this information or taking any action in reliance on or regarding this information. If you have received this fax in error, please notify us immediately by telephone so that we can arrange for its return to [place]. Phone: [phone], Toll-Free: [phone], Fax: [phone] Page: 2 of 2 Call Id: [id number] Caller Disagree/Comply Comply Caller Understands Yes PreDisposition Did not know what to do Care Advice Given Per Guideline SEE PCP WITHIN 3 DAYS: * You need to be seen within 2 or 3 days. Call your doctor (or NP/PA) during regular office hours and make an appointment. A clinic or urgent care center are good places to go for care if your doctor's office is closed or you can't get an appointment. NOTE: If office will be open tomorrow, tell caller to call then, not in 3 days. CALL BACK IF: * Difficulty with breathing occurs * Difficulty with swallowing occurs (e.g., can't swallow or drooling) * You become worse. CARE ADVICE given per Mouth Symptoms (Adult) guideline. Comments User: Robert Genta, RN Date/Time [name] Time): 08/05/2018 7:16:39 AM PATIENT IS GOING TO CALL BACK FOR AN Appointment Referrals REFERRED TO PCP  OFFICE

## 2018-08-05 NOTE — Assessment & Plan Note (Signed)
Restarted SGLT2 yesterday, anticipate slow improvement in control over next few months. Will reassess at f/u visit.

## 2018-08-05 NOTE — Patient Instructions (Addendum)
Price out jardiance vs invokana vs farxiga (all in the same family) Valium sent to new pharmacy.  For skin rash - possible bug bites - treat with steroid cream sent to pharmacy.  You have ulcer at base of tongue on left as well as swelling of base of left tongue. I want you to see dentist to get this checked out (as well as broken left lower tooth).

## 2018-08-05 NOTE — Telephone Encounter (Signed)
Pt already has appt 08/05/18 at 10:30 see Dr Darnell Level. I did speak with pt and he is not having any problem swallowing or breathing. Pt is already at [facility]. FYI to Dr Darnell Level.

## 2018-08-05 NOTE — Assessment & Plan Note (Signed)
1+ month of ulcer at base of L tongue associated with base of tongue swelling.  I did recommend further evaluation for this, he states he will schedule dental evaluation later this week (also has broken tooth to be evaluated).

## 2018-08-05 NOTE — Assessment & Plan Note (Addendum)
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 Pending MRI tomorrow. Valium sent for MRI.

## 2018-08-05 NOTE — Assessment & Plan Note (Signed)
Right mid back - anticipate bug bites. Rx triamcinolone cream for itch. Update if spreading or becoming vesicular.

## 2018-08-05 NOTE — Progress Notes (Signed)
BP 138/78 (BP Location: Left Arm, Patient Position: Sitting, Cuff Size: Normal)   Pulse 97   Temp 98.2 F (36.8 C) (Oral)   Ht 5\' 8"  (1.727 m)   Wt 237 lb (107.5 kg)   SpO2 97%   BMI 36.04 kg/m    CC: white spots under tongue Subjective:    Patient ID: [name], male    DOB: 1950-09-06, 68 y.o.   MRN: [id number]  HPI: [name] is a 68 y.o. male presenting on 08/05/2018 for Mouth Lesions (C/o white spots under the tongue. Areas are multiplying. Noticed about 2 mos ago. ) and Rash (C/o rash on right side of mid back. Area is itchy. Noticed about 1 wk ago after having [place]. )   1+ mo h/o small white bumps under L tongue associated with some tongue swelling. Sometimes drain. Burning and stinging of that side of tongue with any food/drink. No ST, fevers. He has recently started using sensodyne toothpaste. L lower tooth broke after biting piece of carrot - this started after bumps had developed. Planning to see dentist for this. Tried tried nothing for tongue lesions yet.   New itchy rash R mid back that started 1 wk ago after [place]. No tingling/numbness or burning pain associated with rash. Tried tried nothing for this.   Upcoming abd MRI tomorrow for pancreas lesion noted on [place].   Brings log of blood sugars - 200-300, improved readings since starting jardiance yesterday.      Relevant past medical, surgical, family and social history reviewed and updated as indicated. Interim medical history since our last visit reviewed. Allergies and medications reviewed and updated. Outpatient Medications Prior to Visit  Medication Sig Dispense Refill  . aspirin EC 81 MG tablet Take 81 mg by mouth daily.    Marland Kitchen atorvastatin (LIPITOR) 10 MG tablet TAKE 1 TABLET BY MOUTH  DAILY 90 tablet 1  . baclofen (LIORESAL) 10 MG tablet TAKE 1 TABLET BY MOUTH AT  BEDTIME AS NEEDED FOR BACK  SPASMS 90 tablet 1  . empagliflozin (JARDIANCE) 10 MG TABS tablet Take 10 mg by mouth daily. 90 tablet 2  .  fluticasone (FLONASE) 50 MCG/ACT nasal spray USE 2 SPRAYS IN EACH  NOSTRIL DAILY 48 g 3  . gabapentin (NEURONTIN) 100 MG capsule TAKE 1 CAPSULE BY MOUTH TWO TIMES DAILY 180 capsule 1  . glipiZIDE (GLUCOTROL XL) 10 MG 24 hr tablet TAKE 1 TABLET BY MOUTH  DAILY WITH BREAKFAST 90 tablet 2  . glucose blood (ONE TOUCH ULTRA TEST) test strip USE WITH METER TO CHECK  SUGAR TWICE DAILY. 200 each 4  . levofloxacin (LEVAQUIN) 750 MG tablet levofloxacin 750 mg tablet    . losartan (COZAAR) 50 MG tablet TAKE 1 TABLET BY MOUTH  DAILY 90 tablet 0  . Melatonin 5 MG TABS Take 1 tablet (5 mg total) by mouth at bedtime as needed (sleep). 90 tablet 1  . metFORMIN (GLUCOPHAGE) 1000 MG tablet TAKE 1 TABLET BY MOUTH 2  TIMES DAILY WITH MEALS 180 tablet 0  . omeprazole (PRILOSEC) 40 MG capsule TAKE 1 CAPSULE BY MOUTH  DAILY 90 capsule 2  . oxyCODONE-acetaminophen (PERCOCET) 10-325 MG tablet oxycodone-acetaminophen 10 mg-325 mg tablet  TK 1 T PO Q 4 H PRN    . sildenafil (VIAGRA) 100 MG tablet Take 0.5-1 tablets (50-100 mg total) by mouth daily as needed for erectile dysfunction. 5 tablet 3  . tamsulosin (FLOMAX) 0.4 MG CAPS capsule Take 1 capsule (0.4 mg  total) by mouth daily after supper. 90 capsule 3  . UNABLE TO FIND mupirocin 2 % topical ointment    . diazepam (VALIUM) 2 MG tablet Take 1-2 tablets (2-4 mg total) by mouth 2 (two) times daily as needed for anxiety (upcoming MRI). 4 tablet 0  . naproxen (NAPROSYN) 375 MG tablet TAKE 1 TABLET BY MOUTH TWO  TIMES DAILY as needed WITH A MEAL 120 tablet 0   No facility-administered medications prior to visit.      Per HPI unless specifically indicated in ROS section below Review of Systems Objective:    BP 138/78 (BP Location: Left Arm, Patient Position: Sitting, Cuff Size: Normal)   Pulse 97   Temp 98.2 F (36.8 C) (Oral)   Ht 5\' 8"  (1.727 m)   Wt 237 lb (107.5 kg)   SpO2 97%   BMI 36.04 kg/m   Wt Readings from Last 3 Encounters:  08/05/18 237 lb (107.5 kg)   07/28/18 241 lb 3 oz (109.4 kg)  11/18/17 238 lb (108 kg)    Physical Exam Vitals signs and nursing note reviewed.  Constitutional:      Appearance: Normal appearance. He is not ill-appearing.  HENT:     Head: Normocephalic and atraumatic.     Mouth/Throat:     Lips: Pink.     Mouth: Mucous membranes are moist. Oral lesions present.     Dentition: Abnormal dentition.     Comments: Broken L lower molar, several molars absent Ulcer at left base of tongue along with non-tender non-indurated swelling of left tongue base Neck:     Musculoskeletal: Normal range of motion and neck supple. No muscular tenderness.  Lymphadenopathy:     Cervical: No cervical adenopathy.  Skin:    Findings: Rash present. No erythema.     Comments: Few itchy bumps R mid back below scapula without significant erythema or induration  Neurological:     Mental Status: He is alert.       Results for orders placed or performed in visit on 07/28/18  Comprehensive metabolic panel  Result Value Ref Range   Sodium 137 135 - 145 mEq/L   Potassium 4.0 3.5 - 5.1 mEq/L   Chloride 101 96 - 112 mEq/L   CO2 27 19 - 32 mEq/L   Glucose, Bld 210 (H) 70 - 99 mg/dL   BUN 11 6 - 23 mg/dL   Creatinine, Ser 0.89 0.40 - 1.50 mg/dL   Total Bilirubin 0.3 0.2 - 1.2 mg/dL   Alkaline Phosphatase 52 39 - 117 U/L   AST 15 0 - 37 U/L   ALT 20 0 - 53 U/L   Total Protein 7.6 6.0 - 8.3 g/dL   Albumin 4.4 3.5 - 5.2 g/dL   Calcium 9.4 8.4 - 10.5 mg/dL   GFR 102.72 >60.00 mL/min  Hemoglobin A1c  Result Value Ref Range   Hgb A1c MFr Bld 9.2 (H) 4.6 - 6.5 %  CBC with Differential/Platelet  Result Value Ref Range   WBC 4.8 4.0 - 10.5 K/uL   RBC 4.48 4.22 - 5.81 Mil/uL   Hemoglobin 13.9 13.0 - 17.0 g/dL   HCT 40.3 39.0 - 52.0 %   MCV 89.9 78.0 - 100.0 fl   MCHC 34.4 30.0 - 36.0 g/dL   RDW 14.3 11.5 - 15.5 %   Platelets 279.0 150.0 - 400.0 K/uL   Neutrophils Relative % 45.8 43.0 - 77.0 %   Lymphocytes Relative 34.2 12.0 - 46.0 %    Monocytes  Relative 16.1 (H) 3.0 - 12.0 %   Eosinophils Relative 2.7 0.0 - 5.0 %   Basophils Relative 1.2 0.0 - 3.0 %   Neutro Abs 2.2 1.4 - 7.7 K/uL   Lymphs Abs 1.6 0.7 - 4.0 K/uL   Monocytes Absolute 0.8 0.1 - 1.0 K/uL   Eosinophils Absolute 0.1 0.0 - 0.7 K/uL   Basophils Absolute 0.1 0.0 - 0.1 K/uL  POCT Urinalysis Dipstick (Automated)  Result Value Ref Range   Color, UA yellow    Clarity, UA clear    Glucose, UA Positive (A) Negative   Bilirubin, UA negative    Ketones, UA negative    Spec Grav, UA 1.015 1.010 - 1.025   Blood, UA negative    pH, UA 6.0 5.0 - 8.0   Protein, UA Positive (A) Negative   Urobilinogen, UA 0.2 0.2 or 1.0 E.U./dL   Nitrite, UA negatitve    Leukocytes, UA Negative Negative   US Abdomen Complete CLINICAL DATA:  Right upper quadrant pain 2 months  EXAM: ABDOMEN ULTRASOUND COMPLETE  COMPARISON:  CT abdomen pelvis 12/08/2014  FINDINGS: Gallbladder: No gallstones or wall thickening visualized. No sonographic Murphy sign noted by sonographer.  Common bile duct: Diameter: 5.0 mm  Liver: Diffusely increased echogenic liver without focal mass lesion. Portal vein is patent on color Doppler imaging with normal direction of blood flow towards the liver.  IVC: No abnormality visualized.  Pancreas: 1.8 x 1.3 x 1.7 cm hypoechoic lesion in the head of the pancreas. This is not seen on the prior CT. Possible mass lesion.  Spleen: Size and appearance within normal limits.  Right Kidney: Length: 12.4 cm. 8 mm midpole cyst. Echogenicity within normal limits. No mass or hydronephrosis visualized.  Left Kidney: Length: 12.8 cm. Echogenicity within normal limits. No mass or hydronephrosis visualized.  Abdominal aorta: No aneurysm visualized.  Other findings: None.  IMPRESSION: 1. Hyperechoic liver consistent with fatty infiltration 2. Negative for gallstones 3. 1.8 x 1.3 x 1.7 cm hypoechoic lesion head of the pancreas. Possible neoplasm.  Recommend follow-up MRI without and with intravenous contrast.  Electronically Signed   By: Franchot Gallo M.D.   On: 07/31/2018 10:13   Assessment & Plan:   Problem List Items Addressed This Visit    Tongue lesion - Primary    1+ month of ulcer at base of L tongue associated with base of tongue swelling.  I did recommend further evaluation for this, he states he will schedule dental evaluation later this week (also has broken tooth to be evaluated).       Skin rash    Right mid back - anticipate bug bites. Rx triamcinolone cream for itch. Update if spreading or becoming vesicular.      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 Pending MRI tomorrow. Valium sent for MRI.       Diabetes mellitus type 2, uncontrolled, with complications ([place])    Restarted SGLT2 yesterday, anticipate slow improvement in control over next few months. Will reassess at f/u visit.           Meds ordered this encounter  Medications  . naproxen (NAPROSYN) 375 MG tablet    Sig: TAKE 1 TABLET BY MOUTH TWO  TIMES DAILY as needed WITH A MEAL    Dispense:  120 tablet    Refill:  0  . diazepam (VALIUM) 2 MG tablet    Sig: Take 1-2 tablets (  2-4 mg total) by mouth 2 (two) times daily as needed for anxiety (upcoming MRI).    Dispense:  4 tablet    Refill:  0  . triamcinolone cream (KENALOG) 0.1 %    Sig: Apply 1 application topically 2 (two) times daily. Apply to AA.    Dispense:  30 g    Refill:  0   No orders of the defined types were placed in this encounter.   Follow up plan: No follow-ups on file.  Ria Bush, MD

## 2018-08-06 ENCOUNTER — Ambulatory Visit
Admission: RE | Admit: 2018-08-06 | Discharge: 2018-08-06 | Disposition: A | Discharge status: Home / Self Care | Payer: Medicare Other | Source: Ambulatory Visit | Attending: Family Medicine | Admitting: Family Medicine

## 2018-08-06 DIAGNOSIS — K8689 Other specified diseases of pancreas: Secondary | ICD-10-CM

## 2018-08-06 DIAGNOSIS — R1907 Generalized intra-abdominal and pelvic swelling, mass and lump: Secondary | ICD-10-CM

## 2018-08-06 DIAGNOSIS — R1011 Right upper quadrant pain: Secondary | ICD-10-CM | POA: Diagnosis not present

## 2018-08-06 MED ORDER — GADOBENATE DIMEGLUMINE 529 MG/ML IV SOLN
20.0000 mL | Freq: Once | INTRAVENOUS | Status: AC | PRN
  Administered 2018-08-06: 15:00:00 20 mL via INTRAVENOUS

## 2018-08-07 ENCOUNTER — Telehealth: Payer: Self-pay | Admitting: Family Medicine

## 2018-08-07 NOTE — Telephone Encounter (Signed)
FYI-Patient was seen on 08/05/18.  Patient said he went by on Wednesday morning to see his dentist.  Patient's dentist office is closed until the first of May.  Patient said his mouth is extremely dry, especially at night. He said his mouth feels like glue.

## 2018-08-08 MED ORDER — TRIAMCINOLONE ACETONIDE 0.1 % MT PSTE: 1.0000 "application " | PASTE | Freq: Two times a day (BID) | OROMUCOSAL | 1 refills | Status: DC

## 2018-08-08 NOTE — Telephone Encounter (Addendum)
Spoke with pt relaying Dr. G's message.  Verbalizes understanding and expresses his thanks.  

## 2018-08-08 NOTE — Telephone Encounter (Signed)
Increase water intake May try OTC biotene for dry mough. May try triamcinolone paste for ulcer at base of tongue.  If no better with this, then seek dental care 1st of May.

## 2018-08-09 ENCOUNTER — Other Ambulatory Visit: Payer: Self-pay | Admitting: Family Medicine

## 2018-08-09 ENCOUNTER — Other Ambulatory Visit: Payer: Self-pay

## 2018-08-09 ENCOUNTER — Encounter (HOSPITAL_COMMUNITY): Payer: Self-pay | Admitting: Emergency Medicine

## 2018-08-09 ENCOUNTER — Emergency Department (HOSPITAL_COMMUNITY)
Admission: EM | Admit: 2018-08-09 | Discharge: 2018-08-09 | Disposition: A | Discharge status: Home / Self Care | Payer: Medicare Other | Attending: Emergency Medicine | Admitting: Emergency Medicine

## 2018-08-09 ENCOUNTER — Emergency Department (HOSPITAL_COMMUNITY): Payer: Medicare Other

## 2018-08-09 DIAGNOSIS — K1370 Unspecified lesions of oral mucosa: Secondary | ICD-10-CM

## 2018-08-09 DIAGNOSIS — Z79899 Other long term (current) drug therapy: Secondary | ICD-10-CM | POA: Diagnosis not present

## 2018-08-09 DIAGNOSIS — K1379 Other lesions of oral mucosa: Secondary | ICD-10-CM | POA: Diagnosis not present

## 2018-08-09 DIAGNOSIS — Z7984 Long term (current) use of oral hypoglycemic drugs: Secondary | ICD-10-CM | POA: Diagnosis not present

## 2018-08-09 DIAGNOSIS — E119 Type 2 diabetes mellitus without complications: Secondary | ICD-10-CM | POA: Insufficient documentation

## 2018-08-09 DIAGNOSIS — Z8546 Personal history of malignant neoplasm of prostate: Secondary | ICD-10-CM | POA: Diagnosis not present

## 2018-08-09 DIAGNOSIS — Z7982 Long term (current) use of aspirin: Secondary | ICD-10-CM | POA: Insufficient documentation

## 2018-08-09 DIAGNOSIS — K089 Disorder of teeth and supporting structures, unspecified: Secondary | ICD-10-CM | POA: Diagnosis not present

## 2018-08-09 DIAGNOSIS — I10 Essential (primary) hypertension: Secondary | ICD-10-CM | POA: Diagnosis not present

## 2018-08-09 LAB — COMPREHENSIVE METABOLIC PANEL
ALT: 22 U/L (ref 0–44)
AST: 12 U/L — ABNORMAL LOW (ref 15–41)
Albumin: 4.6 g/dL (ref 3.5–5.0)
Alkaline Phosphatase: 55 U/L (ref 38–126)
Anion gap: 9 (ref 5–15)
BUN: 14 mg/dL (ref 8–23)
CO2: 25 mmol/L (ref 22–32)
Calcium: 9.4 mg/dL (ref 8.9–10.3)
Chloride: 106 mmol/L (ref 98–111)
Creatinine, Ser: 0.94 mg/dL (ref 0.61–1.24)
GFR calc Af Amer: >60 mL/min (ref >60)
GFR calc non Af Amer: >60 mL/min (ref >60)
Glucose, Bld: 127 mg/dL — ABNORMAL HIGH (ref 70–99)
Potassium: 3.9 mmol/L (ref 3.5–5.1)
Sodium: 140 mmol/L (ref 135–145)
Total Bilirubin: 0.5 mg/dL (ref 0.3–1.2)
Total Protein: 8.2 g/dL — ABNORMAL HIGH (ref 6.5–8.1)

## 2018-08-09 LAB — CBC WITH DIFFERENTIAL/PLATELET
Abs Immature Granulocytes: 0.03 10*3/uL (ref 0.00–0.07)
Basophils Absolute: 0 10*3/uL (ref 0.0–0.1)
Basophils Relative: 0 %
Eosinophils Absolute: 0.2 10*3/uL (ref 0.0–0.5)
Eosinophils Relative: 2 %
HCT: 40.4 % (ref 39.0–52.0)
Hemoglobin: 13.6 g/dL (ref 13.0–17.0)
Immature Granulocytes: 1 %
Lymphocytes Relative: 36 %
Lymphs Abs: 2.2 10*3/uL (ref 0.7–4.0)
MCH: 30.7 pg (ref 26.0–34.0)
MCHC: 33.7 g/dL (ref 30.0–36.0)
MCV: 91.2 fL (ref 80.0–100.0)
Monocytes Absolute: 0.8 10*3/uL (ref 0.1–1.0)
Monocytes Relative: 13 %
Neutro Abs: 3 10*3/uL (ref 1.7–7.7)
Neutrophils Relative %: 48 %
Platelets: 288 10*3/uL (ref 150–400)
RBC: 4.43 MIL/uL (ref 4.22–5.81)
RDW: 13.6 % (ref 11.5–15.5)
WBC: 6.2 10*3/uL (ref 4.0–10.5)
nRBC: 0 % (ref 0.0–0.2)

## 2018-08-09 MED ORDER — SODIUM CHLORIDE (PF) 0.9 % IJ SOLN
INTRAMUSCULAR | Status: AC
  Filled 2018-08-09: qty 50

## 2018-08-09 MED ORDER — IOHEXOL 300 MG/ML  SOLN
75.0000 mL | Freq: Once | INTRAMUSCULAR | Status: AC | PRN
  Administered 2018-08-09: 75 mL via INTRAVENOUS

## 2018-08-09 NOTE — ED Notes (Signed)
ED Provider at bedside. 

## 2018-08-09 NOTE — ED Notes (Signed)
PA at bedside.

## 2018-08-09 NOTE — ED Notes (Signed)
Patient transported to CT 

## 2018-08-09 NOTE — ED Provider Notes (Signed)
[facility] DEPT Provider Note   CSN: [id number] Arrival date & time: 08/09/18  1514    History   Chief Complaint Chief Complaint  Patient presents with  . Abscess    HPI [name] is a 68 y.o. male.     68 y.o male with a PMH of GERD, HTN presents to the ED with a chief complaint of abscess under his tongue x 1 month. Patient reports this felt tender and fluctuant at first but has now begin draining leaving a bad taste in his mouth. He was seen by PCP four days ago were he was advised to be followed up by dentist, patient reports he was not able to schedule an appointment due to Covid 19 crisis. Patient states this makes it difficult for him with mastication and eating. Patient reports no changes in voices, difficulty tolerating his secretions or fevers. Patient does have a previous surgical history of cervical laminectomy according to his records.      Past Medical History:  Diagnosis Date  . Abdominal wall hernia   . Anxiety   . Back pain    with radiculopathy  . Bladder wall thickening 2013   nl rpt CT scan, normal cytology and UCx, released from urology Karsten Ro)  . Chest pain, non-cardiac 2008   adenosine myoview low risk.  EF 60%.  normal cath 2008, deemed atypical /noncardiac, ?discogenic  . Chronic neck pain   . Chronic pain    narcotic dependent ([place])  . Circadian rhythm sleep disorder, irregular sleep-wake type   . Complication of anesthesia    hard to put patient to sleep  . Depression   . Diverticulosis 2013   by CT scan  . Esophageal stricture 06/2007   dilation of stricture  . GERD (gastroesophageal reflux disease)   . Gross hematuria 2013, 2016   negative CT, cystoscopy, cytology, 2016 - thought from E coli UTI s/p appropriate treatment ([place])  . History of cocaine use 2004  . History of epididymitis   . History of prostatitis 08/2011   ?chronic/simmering, treated with 1 mo cipro  . Hyperlipidemia   .  Hypertension    Essential  . OSA (obstructive sleep apnea) 2010   mild off CPAP  . Postlaminectomy syndrome of cervical region    s/p ACDF C5-7 2010 ([place])  . Prostate cancer ([place]) 06/16/2012   T2a, Gleason 6 prostate cancer found by R apical nodule with PSA 2.18 Karsten Ro, [place]) 04/2016  . T2DM (type 2 diabetes mellitus) ([place])   . TIA (transient ischemic attack)    cocaine related    Patient Active Problem List   Diagnosis Date Noted  . Tongue lesion 08/05/2018  . Fatty liver 07/31/2018  . Mass of pancreas 07/31/2018  . Right sided abdominal pain 07/28/2018  . Right arm pain 07/28/2018  . Balanitis 08/06/2017  . Lightheadedness 08/06/2017  . Erectile dysfunction 08/01/2015  . Health maintenance examination 01/31/2015  . Gross hematuria   . Lipoma of neck 10/01/2014  . Allergic rhinitis 10/01/2014  . Diabetic neuropathy ([place]) 05/31/2014  . Advanced care planning/counseling discussion 01/26/2014  . Cough 06/24/2013  . Lower abdominal pain 04/20/2013  . OSA (obstructive sleep apnea)   . Tinea cruris 10/08/2012  . Skin rash 10/01/2012  . Prostate cancer ([place]) 06/16/2012  . Headache(784.0) 02/12/2012  . Severe obesity (BMI 35.0-39.9) with comorbidity ([place]) 01/08/2012  . Esophageal dysphagia 10/10/2011  . Diverticulosis 10/09/2011  . 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 Insomnia 06/19/2011  .  Shoulder pain 04/08/2011  . Neck pain, chronic 02/24/2011  . Medicare annual wellness visit, subsequent 10/26/2010  . Iron deficiency anemia 07/01/2009  . GERD 09/29/2008  . Essential hypertension 01/01/2008  . ANXIETY DEPRESSION 03/19/2007  . TIA (transient ischemic attack) 03/05/2007  . Back pain with radiculopathy 12/31/2006  . ABDOMINAL WALL HERNIA 10/03/2006  . Diabetes mellitus type 2, uncontrolled, with complications ([place]) 48/54/6270  . COCAINE ABUSE, HX OF 10/01/2006  . HYPERCHOLESTEROLEMIA, PURE 08/21/2005    Past Surgical History:  Procedure Laterality Date  .  ANTERIOR CERVICAL DISCECTOMY  10/28/2008   with fusion (Dr. Rolena Infante)  . CARDIAC CATHETERIZATION  2008   WNL   . CARPAL TUNNEL RELEASE     Right  . CATARACT EXTRACTION Bilateral 12/2014, 01/2015   Dr Tommy Rainwater  . COLONOSCOPY  10/11/2011   severe diverticulosis, int hemorrhoids, rec rpt 5 yrs Carlean Purl)  . DIRECT LARYNGOSCOPY  11/02/2008   Extubation under anesthesia (Dr. Erik Obey)  . ESOPHAGOGASTRODUODENOSCOPY  07/17/07   [place]. Stricture dilated (Dr. Henrene Pastor)  . ESOPHAGOGASTRODUODENOSCOPY  10/11/2011   WNL Carlean Purl)  . EXCISION MASS NECK N/A 08/29/2016   Procedure: EXCISION MASS POSTERIOR NECK;  Surgeon: Erroll Luna, MD;  Location: [facility];  Service: General;  Laterality: N/A;  . HEMATOMA EVACUATION  10/29/2008   (Dr. Rolena Infante)  . INGUINAL HERNIA REPAIR     bilateral, 5 cm. RLQ Abd Lipoma Excision (Dr. Lucia Gaskins)  . LIPOMA EXCISION  08/2016   posterior neck  . SHOULDER ARTHROSCOPY WITH SUBACROMIAL DECOMPRESSION AND OPEN ROTATOR C Left 01/23/2013   Procedure: LEFT SHOULDER ARTHROSCOPY WITH SUBACROMIAL DECOMPRESSION AND MINI OPEN ROTATOR CUFF REPAIR, AND OPEN DISTAL CLAVICLE RESECTION;  Surgeon: Augustin Schooling, MD;  Location: [facility];  Service: Orthopedics;  Laterality: Left;  . SHOULDER SURGERY Right 2013   replaced  . TONSILLECTOMY  childhood  . TRIGGER FINGER RELEASE     Right long finger        Home Medications    Prior to Admission medications   Medication Sig Start Date End Date Taking? Authorizing Provider  aspirin EC 81 MG tablet Take 81 mg by mouth daily. 10/25/11   Thurnell Lose, MD  atorvastatin (LIPITOR) 10 MG tablet TAKE 1 TABLET BY MOUTH  DAILY 05/14/18   Ria Bush, MD  baclofen (LIORESAL) 10 MG tablet TAKE 1 TABLET BY MOUTH AT  BEDTIME AS NEEDED FOR BACK  SPASMS 07/28/18   Ria Bush, MD  diazepam (VALIUM) 2 MG tablet Take 1-2 tablets (2-4 mg total) by mouth 2 (two) times daily as needed for anxiety (upcoming MRI). 08/05/18   Ria Bush,  MD  empagliflozin (JARDIANCE) 10 MG TABS tablet Take 10 mg by mouth daily. 07/30/18   Ria Bush, MD  fluticasone [facility]) 50 MCG/ACT nasal spray USE 2 SPRAYS IN [facility]  NOSTRIL DAILY 03/27/17   Ria Bush, MD  gabapentin (NEURONTIN) 100 MG capsule TAKE 1 CAPSULE BY MOUTH TWO TIMES DAILY 07/28/18   Ria Bush, MD  glipiZIDE (GLUCOTROL XL) 10 MG 24 hr tablet TAKE 1 TABLET BY MOUTH  DAILY WITH BREAKFAST 06/03/17   Ria Bush, MD  glucose blood (ONE TOUCH ULTRA TEST) test strip USE WITH METER TO CHECK  SUGAR TWICE DAILY. 03/10/18   Ria Bush, MD  levofloxacin (LEVAQUIN) 750 MG tablet levofloxacin 750 mg tablet    [provider]  losartan (COZAAR) 50 MG tablet TAKE 1 TABLET BY MOUTH  DAILY 03/28/18   Ria Bush, MD  Melatonin 5 MG TABS Take  1 tablet (5 mg total) by mouth at bedtime as needed (sleep). 07/11/17   Ria Bush, MD  metFORMIN (GLUCOPHAGE) 1000 MG tablet TAKE 1 TABLET BY MOUTH 2  TIMES DAILY WITH MEALS 07/28/18   Ria Bush, MD  naproxen (NAPROSYN) 375 MG tablet TAKE 1 TABLET BY MOUTH TWO  TIMES DAILY as needed WITH A MEAL 08/05/18   Ria Bush, MD  omeprazole (PRILOSEC) 40 MG capsule TAKE 1 CAPSULE BY MOUTH  DAILY 06/03/17   Ria Bush, MD  oxyCODONE-acetaminophen (PERCOCET) 10-325 MG tablet oxycodone-acetaminophen 10 mg-325 mg tablet  TK 1 T PO Q 4 H PRN    [provider]  sildenafil (VIAGRA) 100 MG tablet Take 0.5-1 tablets (50-100 mg total) by mouth daily as needed for erectile dysfunction. 02/13/18   Ria Bush, MD  tamsulosin (FLOMAX) 0.4 MG CAPS capsule Take 1 capsule (0.4 mg total) by mouth daily after supper. 05/31/14   Ria Bush, MD  triamcinolone (KENALOG) 0.1 % paste Use as directed 1 application in the mouth or throat 2 (two) times daily. 08/08/18   Ria Bush, MD  triamcinolone cream (KENALOG) 0.1 % Apply 1 application topically 2 (two) times daily. Apply to [place]. 08/05/18 08/05/19   Ria Bush, MD  UNABLE TO FIND mupirocin 2 % topical ointment    [provider]    Family History Family History  Problem Relation Age of Onset  . Diabetes Mother   . Hypertension Mother   . Hyperlipidemia Mother   . Diabetes Father   . Diabetes Sister   . Hypertension Sister   . Hypertension Brother   . Diabetes Brother   . Diabetes Brother   . Hypertension Brother   . Diabetes Brother   . Heart disease Maternal Uncle        Heart failure  . Cancer Paternal Uncle        Prostate  . Cancer Paternal Grandfather        Prostate  . Stroke Neg Hx     Social History Social History   Tobacco Use  . Smoking status: Never Smoker  . Smokeless tobacco: Never Used  Substance Use Topics  . Alcohol use: No    Alcohol/week: 0.0 standard drinks  . Drug use: No    Comment: H/O marijuana (last 2005); Cocaine 5/04     Allergies   Gabapentin; Glimepiride; and Menthol (topical analgesic)   Review of Systems Review of Systems  Constitutional: Negative for chills and fever.  HENT: Positive for dental problem and mouth sores. Negative for drooling, ear pain, facial swelling and sore throat.   Eyes: Negative for pain and visual disturbance.  Respiratory: Negative for cough and shortness of breath.   Cardiovascular: Negative for chest pain and palpitations.  Gastrointestinal: Negative for abdominal pain and vomiting.  Genitourinary: Negative for dysuria and hematuria.  Musculoskeletal: Negative for arthralgias and back pain.  Skin: Negative for color change and rash.  Neurological: Negative for seizures and syncope.  All other systems reviewed and are negative.    Physical Exam Updated Vital Signs BP (!) 177/93 (BP Location: Left Arm)   Pulse 97   Temp 98 F (36.7 C) (Oral)   Resp 18   SpO2 100%   Physical Exam Vitals signs and nursing note reviewed.  Constitutional:      General: He is not in acute distress.    Appearance: Normal appearance. He is  well-developed.  HENT:     Head: Normocephalic and atraumatic.     Mouth/Throat:  Dentition: Abnormal dentition. Dental caries present.     Tongue: Lesions present.     Pharynx: Oropharynx is clear. Uvula midline. No pharyngeal swelling or posterior oropharyngeal erythema.     Tonsils: No tonsillar exudate or tonsillar abscesses.      Comments: Tongue lesion noted, fluctuant, erythematous and actively draining. Not tender to palpation. Very poor dentition throuhgout  Eyes:     General: No scleral icterus.    Pupils: Pupils are equal, round, and reactive to light.  Neck:     Musculoskeletal: Normal range of motion.  Cardiovascular:     Heart sounds: Normal heart sounds.  Pulmonary:     Effort: Pulmonary effort is normal.     Breath sounds: Normal breath sounds. No wheezing.  Chest:     Chest wall: No tenderness.  Abdominal:     General: Bowel sounds are normal. There is no distension.     Palpations: Abdomen is soft.     Tenderness: There is no abdominal tenderness.  Musculoskeletal:        General: No tenderness or deformity.  Skin:    General: Skin is warm and dry.  Neurological:     Mental Status: He is alert and oriented to person, place, and time.      ED Treatments / Results  Labs (all labs ordered are listed, but only abnormal results are displayed) Labs Reviewed  COMPREHENSIVE METABOLIC PANEL - Abnormal; Notable for the following components:      Result Value   Glucose, Bld 127 (*)    Total Protein 8.2 (*)    AST 12 (*)    All other components within normal limits  CBC WITH DIFFERENTIAL/PLATELET    EKG None  Radiology Ct Soft Tissue Neck W Contrast  Result Date: 08/09/2018 CLINICAL DATA:  68 year old male with ?abscess under tongue x3 weeks, recent white drainage?Marland Kitchen EXAM: CT NECK WITH CONTRAST TECHNIQUE: Multidetector CT imaging of the neck was performed using the standard protocol following the bolus administration of intravenous contrast. CONTRAST:   63mL OMNIPAQUE IOHEXOL 300 MG/ML  SOLN COMPARISON:  Cervical spine CT 02/18/2012. FINDINGS: Pharynx and larynx: Mild motion artifact at the larynx which appears to remain within normal limits. Pharyngeal soft tissue contours are within normal limits. Negative parapharyngeal and retropharyngeal spaces. Oropharynx/oral tongue appears within normal limits. Sublingual space described below. Salivary glands: The sublingual space appears symmetric and within normal limits. No sublingual mass or fluid collection identified. There is carious mandible anterior left bicuspid and right canine dentition with some periapical lucency but no other complicating features evident. Submandibular spaces and glands appear symmetric and within normal limits. Parotid glands are within normal limits. Thyroid: Negative. Lymph nodes: Negative. No lymphadenopathy. Vascular: Major vascular structures in the neck and at the skull base appear patent. Tortuous cervical ICAs and proximal left CCA. Limited intracranial: Negative. Visualized orbits: Negative aside from postoperative changes to both globes. Mastoids and visualized paranasal sinuses: Clear bilaterally. Skeleton: Mandible dental caries described above. Prior C5-C6 and C7-T1 ACDF. Solid C6-C7 arthrodesis. Suspected pseudoarthrosis at C5-C6. Possible arthrodesis at C7-T1. No acute osseous abnormality identified. Upper chest: Tortuous proximal great vessels but otherwise negative visible superior mediastinum. Lung apices appears stable and negative. IMPRESSION: 1. Mandibular dental disease, but no abscess, lingual or sublingual abnormality identified. 2. Previous C5-C6 through C7-T1 ACDF with evidence of C5-C6 pseudoarthrosis. Electronically Signed   By: Genevie Ann M.D.   On: 08/09/2018 17:14    Procedures Procedures (including critical care time)  Medications Ordered in  ED Medications  sodium chloride (PF) 0.9 % injection (has no administration in time range)  iohexol (OMNIPAQUE)  300 MG/ML solution 75 mL (75 mLs Intravenous Contrast Given 08/09/18 1639)     Initial Impression / Assessment and Plan / ED Course  I have reviewed the triage vital signs and the nursing notes.  Pertinent labs & imaging results that were available during my care of the patient were reviewed by me and considered in my medical decision making (see chart for details).     Patient with mouth lesion x 2 month. Seen by PCP 4 days ago for the same complaint thought to be an ulcer however patient reports lesion has now opened and begin draining. He reports discomfort with eating, mastication, drinking. Patient also reports a foul taste in his mouth. During evaluation no swelling noted to the neck, oropharynx appears clear, patient is tolerating his secretions.  There is swelling below his tongue, likely a lesion that has been draining.  Patient attempted to have a dentist appointment but was unable to schedule I. CMP showed no electrolyte abnormality, CBC showed no leukocytosis, patient reports no fevers at home.  Some suspicion for lymphadenopathy, or other injury to his tongue.  Will obtain CT soft tissue neck to rule out any abscess.  Or abnormality to lingual or sublingual region.   CT Soft tissue showed: 1. Mandibular dental disease, but no abscess, lingual or sublingual  abnormality identified.  2. Previous C5-C6 through C7-T1 ACDF with evidence of C5-C6  pseudoarthrosis.     These results have been explained to patient who understands and at this time he will need to obtain dental follow-up, he reports he is he is having a nasty taste in his mouth, instructed to continue with salt gargle, rinse his mouth constantly and wash his tongue.  Patient understands and agrees with management at this time.  With stable vital signs stable for discharge.   Portions of this note were generated with Lobby[employer]. Dictation errors may occur despite best attempts at proofreading.   Final  Clinical Impressions(s) / ED Diagnoses   Final diagnoses:  Mouth lesion    ED Discharge Orders    None       Janeece Fitting, PA-C 08/09/18 [place], Ankit, MD 08/09/18 2038

## 2018-08-09 NOTE — ED Notes (Signed)
Patient requesting to leave. PA made aware.

## 2018-08-09 NOTE — ED Triage Notes (Signed)
Patient c/o abscess under tongue x3 weeks. Reports recently had white drainage.

## 2018-08-09 NOTE — Discharge Instructions (Addendum)
Your CT soft tissue today was normal. Please follow up with a dentist at your earliest convenience for further evaluation of your dental lesion.

## 2018-08-13 ENCOUNTER — Other Ambulatory Visit: Payer: Self-pay | Admitting: *Deleted

## 2018-08-13 ENCOUNTER — Telehealth: Payer: Self-pay | Admitting: Family Medicine

## 2018-08-13 ENCOUNTER — Encounter: Payer: Self-pay | Admitting: *Deleted

## 2018-08-13 NOTE — Telephone Encounter (Signed)
Patient advised of information. 

## 2018-08-13 NOTE — Telephone Encounter (Signed)
Patient called and stated that he needs someone to call him back for him to discuss the issues he is having with his mouth. Stated he spoke to someone this morning and wants to explain further because he is in pain.

## 2018-08-13 NOTE — Telephone Encounter (Signed)
CT scan was done at ER.  plz notify patient: IMPRESSION: 1. Mandibular dental disease, but no abscess, lingual or sublingual abnormality identified. 2. Previous C5-C6 through C7-T1 ACDF with evidence of C5-C6 pseudoarthrosis (hardware).

## 2018-08-13 NOTE — Telephone Encounter (Addendum)
I spoke with pt; pt has dry mouth; pt could not get the triamcinolone cream due to too expensive at local pharmacy. Pt wants  Dr Darnell Level to send [facility] because pt thinks can get cheaper at mail order.Pt said he will also try to contact [facility] that he talked with earlier today.  Dr Darnell Level do you want tramcinolone cream or paste to [facility].Please advise.

## 2018-08-13 NOTE — Telephone Encounter (Signed)
Patient called the office this morning in regards to a scan that he had done and that he has not received a phone call yet in regards to results.   I see in the notes that 8:35am this morning results were given but he said he has not heard from anyone.

## 2018-08-13 NOTE — Patient Outreach (Signed)
[facility]) Care Management  08/13/2018  [name] June 22, 1950 [id number]   Subjective: Telephone call to patient's home / mobile number, spoke with patient, and HIPAA verified.  Discussed [place] [employer] Management follow up referral, patient voiced understanding, and is in agreement to follow up.  Patient states he is doing pretty good now, received Jardiance medication last week, also received a bill for the medication ($139.00) that he can not afford, and unable to afford groceries. States prior to starting the [place], he was having dizziness, decreased energy, higher blood sugars, feeling down, now all these signs/ symptoms have improved,  blood sugar range is 160 - 194, tweaking food, monitoring blood sugar in relation to food choices, and is aware certain foods will cause blood sugars to be in a higher range.  Patient in agreement to referral to [place] for medication assistance for Jardiance and cream for tongue lesion treatment (he is not sure of the name of the medication, prescription has been called into the pharmacy by MD but patient unable to pick up due to cost).   Patient in agreement to a referral to [place] Management Social Worker for food assistance.   Patient states he has a strong faith in God, will let God do his will, is not worried about this prostate cancer, and is following up with MD every 6 months.   Patient states he does not have any education material, transition of care, care coordination, disease management, disease monitoring,or transportation, needs at this time.  States she is very appreciative of the follow up and is in agreement to receive [place] Management services.    Objective: Per KPN (Knowledge Performance Now, point of care tool) and chart review,  Patient has not had any recent hospitalizations.   Patient had ED visit on 08/09/2018 for mouth lesion.  Patient also has a  history of diabetes, hypertension, prostate cancer, hyperlipidemia, OSA, TIA, Diabetic neuropathy, and Esophageal stricture.       Assessment: Received [employer] Management referral on 08/12/2018.  Referral reason: Medication Assistance with Jardiance, medication assistance for cream for tongue lesion, and food assistance.   Screening follow up completed, will refer to [place] Social Worker for food assistance, and Pharmacy for medication assistance.    Plan: RNCM will refer patient to [place] Management Social Worker for food assistance, and Pharmacy for medication assistance.     [name] H. Annia Friendly, BSN, [place] Management [facility] Telephonic CM Phone: [phone] Fax: [phone]

## 2018-08-13 NOTE — Telephone Encounter (Signed)
Patient received results from MRI, but has not received results from CT scan on 08/09/2018.  Please advise.

## 2018-08-14 ENCOUNTER — Other Ambulatory Visit: Payer: Self-pay | Admitting: Pharmacist

## 2018-08-14 ENCOUNTER — Other Ambulatory Visit: Payer: Self-pay

## 2018-08-14 MED ORDER — CHLORHEXIDINE GLUCONATE 0.12 % MT SOLN: 15.0000 mL | Freq: Two times a day (BID) | OROMUCOSAL | 0 refills | Status: DC

## 2018-08-14 NOTE — Patient Outreach (Signed)
[facility]) Care Management  [name]   08/14/2018  [name] November 30, 1950 [id number]  Reason for referral: Medication Assistance  Referral source: [facility] RN Current insurance: [facility]  PMHx includes but not limited to:  GERD, HTN, HLD, T2DM, TIA, prostate cancer, anxiety/depression, chronic pain, recent ED visit for mouth abscess  Outreach:  Successful telephone call with [name].  HIPAA identifiers verified.   Subjective:  Patient having difficulty affording Jardiance.  He states that medication was delivered from [facility] for 90 day supply but now cannot afford the bill.  He is working on a Agricultural consultant for [employer].  He states his blood sugars prior to taking this medication were in the 300-400s, but have decreased to be in 100s since starting it. He is checking CBGs 2-3x a day.    Patient states he was told he had a cream ready to pick up at local pharmacy ([facility] on [address]) but does not know the name or how much it will clost.   3-way call to [place].  Per pharmacist, prescription for Triamcinolone 0.1% dental paste is ready for pick-up, will cost $36.85, patient reports he is able to pay for this.    Patient reports using a pillbox as an adherence strategy.  Does the patient ever forget to take medication?  no Does the patient have problems obtaining medications due to transportation?   no Does the patient have problems obtaining medications due to cost?  yes  Does the patient feel that medications prescribed are effective?  yes Does the patient ever experience any side effects to the medications prescribed?  no  Does the patient measure his/her own blood glucose at home?  Yes Does the patient measure his/her own blood pressure at home? Yes   Objective: Lab Results  Component Value Date   CREATININE 0.94 08/09/2018   CREATININE 0.89 07/28/2018   CREATININE 1.01 07/11/2017    Lab Results  Component Value  Date   HGBA1C 9.2 (H) 07/28/2018    Lipid Panel     Component Value Date/Time   CHOL 148 03/04/2017 0907   TRIG 100.0 03/04/2017 0907   HDL 35.20 (L) 03/04/2017 0907   CHOLHDL 4 03/04/2017 0907   VLDL 20.0 03/04/2017 0907   LDLCALC 93 03/04/2017 0907   LDLDIRECT 104 (H) 12/26/2012 1443    BP Readings from Last 3 Encounters:  08/09/18 (!) 143/92  08/05/18 138/78  07/28/18 140/78    Allergies  Allergen Reactions  . Gabapentin     Dizziness  . Glimepiride Other (See Comments)    Headache, back pain  . Menthol (Topical Analgesic) Rash    Burning rash    Medications Reviewed Today    Reviewed by Robert Bush, MD (Physician) on 08/05/18 at 1041  Med List Status: <None>  Medication Order Taking? Sig Documenting Provider Last Dose Status Informant  aspirin EC 81 MG tablet [id number] Yes Take 81 mg by mouth daily. Thurnell Lose, MD Taking Active Self  atorvastatin (LIPITOR) 10 MG tablet [id number] Yes TAKE 1 TABLET BY MOUTH  DAILY Robert Bush, MD Taking Active   baclofen (LIORESAL) 10 MG tablet [id number] Yes TAKE 1 TABLET BY MOUTH AT  BEDTIME AS NEEDED FOR BACK  SPASMS Robert Bush, MD Taking Active   diazepam (VALIUM) 2 MG tablet [id number] Yes Take 1-2 tablets (2-4 mg total) by mouth 2 (two) times daily as needed for anxiety (upcoming MRI). Robert Bush, MD Taking Active   empagliflozin (  JARDIANCE) 10 MG TABS tablet [id number] Yes Take 10 mg by mouth daily. Robert Bush, MD Taking Active   fluticasone [facility]) 50 MCG/ACT nasal spray [id number] Yes USE 2 SPRAYS IN [facility]  NOSTRIL DAILY Robert Bush, MD Taking Active   gabapentin (NEURONTIN) 100 MG capsule [id number] Yes TAKE 1 CAPSULE BY MOUTH TWO TIMES DAILY Robert Bush, MD Taking Active   glipiZIDE (GLUCOTROL XL) 10 MG 24 hr tablet [id number] Yes TAKE 1 TABLET BY MOUTH  DAILY WITH Sander Radon, MD Taking Active   glucose blood (ONE TOUCH ULTRA TEST) test strip [id number] Yes USE WITH  METER TO CHECK  SUGAR TWICE DAILY. Robert Bush, MD Taking Active   levofloxacin [facility]) 750 MG tablet [id number] Yes levofloxacin 750 mg tablet [provider] Taking Active   losartan (COZAAR) 50 MG tablet [id number] Yes TAKE 1 TABLET BY MOUTH  DAILY Robert Bush, MD Taking Active   Melatonin 5 MG TABS [id number] Yes Take 1 tablet (5 mg total) by mouth at bedtime as needed (sleep). Robert Bush, MD Taking Active   metFORMIN (GLUCOPHAGE) 1000 MG tablet [id number] Yes TAKE 1 TABLET BY MOUTH 2  TIMES DAILY WITH MEALS Robert Bush, MD Taking Active   naproxen (NAPROSYN) 375 MG tablet [id number] Yes TAKE 1 TABLET BY MOUTH TWO  TIMES DAILY as needed WITH A MEAL Robert Bush, MD Taking Active   omeprazole (PRILOSEC) 40 MG capsule [id number] Yes TAKE 1 CAPSULE BY MOUTH  DAILY Robert Bush, MD Taking Active   oxyCODONE-acetaminophen (PERCOCET) 10-325 MG tablet [id number] Yes oxycodone-acetaminophen 10 mg-325 mg tablet  TK 1 T PO Q 4 H PRN [provider] Taking Active   sildenafil (VIAGRA) 100 MG tablet [id number] Yes Take 0.5-1 tablets (50-100 mg total) by mouth daily as needed for erectile dysfunction. Robert Bush, MD Taking Active   tamsulosin [facility]) 0.4 MG CAPS capsule [id number] Yes Take 1 capsule (0.4 mg total) by mouth daily after supper. Robert Bush, MD Taking Active   UNABLE TO FIND [id number] Yes mupirocin 2 % topical ointment [provider] Taking Active           Assessment:  Drugs sorted by system:  Neurologic/Psychologic:gabapentin  Cardiovascular: aspirin 58m, atorvastatin, losartan  Pulmonary/Allergy: fluticasone  Endocrine: empagliflozin, glipizide, metformin  Topical: triamcinolone 0.1% oral paste  Pain:baclofen, oxycodone-acetaminophen  Medication Review Findings:  -Stopped taking aspirin due to TV commercials, has not discussed this with MD -Ran out of glipizide, possibly due to no refills.    -Allergy  listed to gabapentin but tolerating without issues at low dose  Medication Assistance Findings:  Medication assistance needs identified.   Extra Help:  Not eligible for Extra Help Low Income Subsidy based on reported income and assets  Patient Assistance Programs: Jardiance made by BVolusiarequirement met: Yes o Out-of-pocket prescription expenditure met:   Not Applicable - Patient has met application requirements to apply for this patient assistance program.   - Reviewed program requirements with patients.    Plan: . I will route patient assistance letter to TGarden Citytechnician who will coordinate patient assistance program application process for medications listed above.  [facility] technician will assist with obtaining all required documents from both patient and provider(s) and submit application(s) once completed.   . Will contact Dr. GDanise Minaregarding Aspirin 849mand glipizide  CoRalene BathePharmD, [phone]

## 2018-08-14 NOTE — Telephone Encounter (Addendum)
Spoke with pt relaying Dr. Synthia Innocent message. Pt verbalizes understanding and says he appreciate everything Dr. Darnell Level is doing.

## 2018-08-14 NOTE — Telephone Encounter (Signed)
Spoke with pt relaying Dr. Synthia Innocent message.  Pt verbalizes understanding but has already received the paste and is using it.  Says it is working good for gums and the area under his tongue.  Also, it's helping resolve the dry mouth issue.  I suggested he hold off on picking up the rinse to see what Dr. Darnell Level recommends based on this info.

## 2018-08-14 NOTE — Patient Outreach (Signed)
[name]) Care Management  08/14/2018  [name] Dec 26, 1950 [id number]  Successful outreach to patient regarding social work referral for food resources.  BSW talked with patient about local food pantries and offered to send him [employer] [place].  BSW also offered to submit referral to pantry at [place]. BSW talked with patient about [place] which would allow patient to receive seven delivered meals once per week until further notice. Patient was very pleasant and expressed appreciation for resources but declined them.  Patient reports that he enjoys cooking for himself and that he is hesitant to use pantries because of lack of fresh foods.   BSW informed him about Com[employer] at [place] on Mondays, Wednesdays, and Fridays.  The market does provide fresh produce through [employer]. Patient was appreciative of suggestion. BSW is closing case but encouraged him to call if additional needs arise or he changes his mind about offered resources  [employer], [place] Worker [phone]

## 2018-08-14 NOTE — Telephone Encounter (Signed)
Ok - let's continue paste for now - it is likely to provide the best relief.

## 2018-08-14 NOTE — Addendum Note (Signed)
Addended by: Ria Bush on: 08/14/2018 10:31 AM   Modules accepted: Orders

## 2018-08-14 NOTE — Telephone Encounter (Signed)
Noted  

## 2018-08-14 NOTE — Telephone Encounter (Addendum)
I'd like him to try chlorhexidine oral rinse in place of the steroid paste. Should be more affordable. Let us know how this helps. Sent in to [employer] [employer] on [address]

## 2018-08-15 ENCOUNTER — Other Ambulatory Visit: Payer: Self-pay | Admitting: Pharmacy Technician

## 2018-08-15 ENCOUNTER — Ambulatory Visit: Payer: Medicare Other | Admitting: Podiatry

## 2018-08-15 NOTE — Patient Outreach (Signed)
[facility]) Care Management  08/15/2018  DELWYN SCOGGIN [birth year] [id number]                          Medication Assistance Referral  Referral From: [facility] Waynard Reeds  Medication/Company: Vania Rea / [employer] Patient application portion:  Mailed Provider application portion: Faxed  to Dr. Danise Mina   Follow up:  Will follow up with patient in 7-10 business days to confirm application(s) have been received.  Maud Deed Chana Bode [place] Certified Pharmacy Technician [place] Management Direct Dial:[phone]

## 2018-08-20 ENCOUNTER — Ambulatory Visit: Payer: Medicare Other | Admitting: Podiatry

## 2018-08-22 ENCOUNTER — Other Ambulatory Visit: Payer: Self-pay | Admitting: Pharmacy Technician

## 2018-08-22 NOTE — Patient Outreach (Signed)
[facility]) Care Management  08/22/2018  [name] 07-31-1950 [id number]   Received patient portion(s) of patient assistance application for Jardiance. Faxed completed application and required documents into [employer].  Will follow up with company in 7-10 business days to check status of application.  Maud Deed Chana Bode [place] Certified Pharmacy Technician [place] Management Direct Dial:[phone]

## 2018-08-25 ENCOUNTER — Other Ambulatory Visit: Payer: Self-pay

## 2018-08-25 ENCOUNTER — Encounter (HOSPITAL_COMMUNITY): Payer: Self-pay

## 2018-08-25 ENCOUNTER — Emergency Department (HOSPITAL_COMMUNITY): Payer: Medicare Other

## 2018-08-25 ENCOUNTER — Ambulatory Visit: Payer: Self-pay | Admitting: *Deleted

## 2018-08-25 ENCOUNTER — Inpatient Hospital Stay (HOSPITAL_COMMUNITY)
Admission: EM | Admit: 2018-08-25 | Discharge: 2018-08-29 | DRG: 287 | Disposition: A | Discharge status: Home / Self Care | Payer: Medicare Other | Attending: Internal Medicine | Admitting: Internal Medicine

## 2018-08-25 DIAGNOSIS — Z833 Family history of diabetes mellitus: Secondary | ICD-10-CM

## 2018-08-25 DIAGNOSIS — R0789 Other chest pain: Secondary | ICD-10-CM | POA: Diagnosis not present

## 2018-08-25 DIAGNOSIS — E1165 Type 2 diabetes mellitus with hyperglycemia: Secondary | ICD-10-CM | POA: Diagnosis not present

## 2018-08-25 DIAGNOSIS — E1169 Type 2 diabetes mellitus with other specified complication: Secondary | ICD-10-CM | POA: Diagnosis present

## 2018-08-25 DIAGNOSIS — E119 Type 2 diabetes mellitus without complications: Secondary | ICD-10-CM | POA: Diagnosis not present

## 2018-08-25 DIAGNOSIS — M542 Cervicalgia: Secondary | ICD-10-CM | POA: Diagnosis present

## 2018-08-25 DIAGNOSIS — Z7982 Long term (current) use of aspirin: Secondary | ICD-10-CM

## 2018-08-25 DIAGNOSIS — E7800 Pure hypercholesterolemia, unspecified: Secondary | ICD-10-CM | POA: Diagnosis not present

## 2018-08-25 DIAGNOSIS — Z9842 Cataract extraction status, left eye: Secondary | ICD-10-CM | POA: Diagnosis not present

## 2018-08-25 DIAGNOSIS — I771 Stricture of artery: Secondary | ICD-10-CM | POA: Diagnosis present

## 2018-08-25 DIAGNOSIS — Z7984 Long term (current) use of oral hypoglycemic drugs: Secondary | ICD-10-CM

## 2018-08-25 DIAGNOSIS — Z20828 Contact with and (suspected) exposure to other viral communicable diseases: Secondary | ICD-10-CM | POA: Diagnosis not present

## 2018-08-25 DIAGNOSIS — R509 Fever, unspecified: Secondary | ICD-10-CM | POA: Diagnosis present

## 2018-08-25 DIAGNOSIS — Z888 Allergy status to other drugs, medicaments and biological substances status: Secondary | ICD-10-CM

## 2018-08-25 DIAGNOSIS — I119 Hypertensive heart disease without heart failure: Secondary | ICD-10-CM | POA: Diagnosis present

## 2018-08-25 DIAGNOSIS — G4733 Obstructive sleep apnea (adult) (pediatric): Secondary | ICD-10-CM | POA: Diagnosis not present

## 2018-08-25 DIAGNOSIS — Z7951 Long term (current) use of inhaled steroids: Secondary | ICD-10-CM | POA: Diagnosis not present

## 2018-08-25 DIAGNOSIS — K219 Gastro-esophageal reflux disease without esophagitis: Secondary | ICD-10-CM | POA: Diagnosis present

## 2018-08-25 DIAGNOSIS — R1013 Epigastric pain: Secondary | ICD-10-CM | POA: Diagnosis not present

## 2018-08-25 DIAGNOSIS — Z560 Unemployment, unspecified: Secondary | ICD-10-CM

## 2018-08-25 DIAGNOSIS — I361 Nonrheumatic tricuspid (valve) insufficiency: Secondary | ICD-10-CM | POA: Diagnosis not present

## 2018-08-25 DIAGNOSIS — E785 Hyperlipidemia, unspecified: Secondary | ICD-10-CM | POA: Diagnosis present

## 2018-08-25 DIAGNOSIS — Z8249 Family history of ischemic heart disease and other diseases of the circulatory system: Secondary | ICD-10-CM

## 2018-08-25 DIAGNOSIS — I10 Essential (primary) hypertension: Secondary | ICD-10-CM | POA: Diagnosis not present

## 2018-08-25 DIAGNOSIS — Z79899 Other long term (current) drug therapy: Secondary | ICD-10-CM

## 2018-08-25 DIAGNOSIS — E118 Type 2 diabetes mellitus with unspecified complications: Secondary | ICD-10-CM | POA: Diagnosis present

## 2018-08-25 DIAGNOSIS — Z8349 Family history of other endocrine, nutritional and metabolic diseases: Secondary | ICD-10-CM

## 2018-08-25 DIAGNOSIS — R1011 Right upper quadrant pain: Secondary | ICD-10-CM | POA: Diagnosis present

## 2018-08-25 DIAGNOSIS — G8929 Other chronic pain: Secondary | ICD-10-CM | POA: Diagnosis present

## 2018-08-25 DIAGNOSIS — R0602 Shortness of breath: Secondary | ICD-10-CM

## 2018-08-25 DIAGNOSIS — Z6834 Body mass index (BMI) 34.0-34.9, adult: Secondary | ICD-10-CM

## 2018-08-25 DIAGNOSIS — Z9841 Cataract extraction status, right eye: Secondary | ICD-10-CM

## 2018-08-25 DIAGNOSIS — Z8042 Family history of malignant neoplasm of prostate: Secondary | ICD-10-CM

## 2018-08-25 DIAGNOSIS — R079 Chest pain, unspecified: Secondary | ICD-10-CM | POA: Diagnosis present

## 2018-08-25 DIAGNOSIS — R9439 Abnormal result of other cardiovascular function study: Secondary | ICD-10-CM | POA: Diagnosis not present

## 2018-08-25 DIAGNOSIS — Z8673 Personal history of transient ischemic attack (TIA), and cerebral infarction without residual deficits: Secondary | ICD-10-CM

## 2018-08-25 DIAGNOSIS — K760 Fatty (change of) liver, not elsewhere classified: Secondary | ICD-10-CM | POA: Diagnosis present

## 2018-08-25 DIAGNOSIS — Z981 Arthrodesis status: Secondary | ICD-10-CM

## 2018-08-25 DIAGNOSIS — Z8546 Personal history of malignant neoplasm of prostate: Secondary | ICD-10-CM

## 2018-08-25 DIAGNOSIS — E669 Obesity, unspecified: Secondary | ICD-10-CM | POA: Diagnosis present

## 2018-08-25 HISTORY — DX: Chest pain, unspecified: R07.9

## 2018-08-25 LAB — RAPID URINE DRUG SCREEN, HOSP PERFORMED
Amphetamines: NOT DETECTED
Barbiturates: NOT DETECTED
Benzodiazepines: NOT DETECTED
Cocaine: NOT DETECTED
Opiates: POSITIVE — AB
Tetrahydrocannabinol: NOT DETECTED

## 2018-08-25 LAB — CBC WITH DIFFERENTIAL/PLATELET
Abs Immature Granulocytes: 0.07 10*3/uL (ref 0.00–0.07)
Basophils Absolute: 0 10*3/uL (ref 0.0–0.1)
Basophils Relative: 0 %
Eosinophils Absolute: 0 10*3/uL (ref 0.0–0.5)
Eosinophils Relative: 0 %
HCT: 41.3 % (ref 39.0–52.0)
Hemoglobin: 14.1 g/dL (ref 13.0–17.0)
Immature Granulocytes: 1 %
Lymphocytes Relative: 6 %
Lymphs Abs: 0.7 10*3/uL (ref 0.7–4.0)
MCH: 30.9 pg (ref 26.0–34.0)
MCHC: 34.1 g/dL (ref 30.0–36.0)
MCV: 90.6 fL (ref 80.0–100.0)
Monocytes Absolute: 0.1 10*3/uL (ref 0.1–1.0)
Monocytes Relative: 1 %
Neutro Abs: 9.7 10*3/uL — ABNORMAL HIGH (ref 1.7–7.7)
Neutrophils Relative %: 92 %
Platelets: 289 10*3/uL (ref 150–400)
RBC: 4.56 MIL/uL (ref 4.22–5.81)
RDW: 14.5 % (ref 11.5–15.5)
WBC: 10.6 10*3/uL — ABNORMAL HIGH (ref 4.0–10.5)
nRBC: 0 % (ref 0.0–0.2)

## 2018-08-25 LAB — COMPREHENSIVE METABOLIC PANEL
ALT: 30 U/L (ref 0–44)
AST: 20 U/L (ref 15–41)
Albumin: 4.6 g/dL (ref 3.5–5.0)
Alkaline Phosphatase: 49 U/L (ref 38–126)
Anion gap: 13 (ref 5–15)
BUN: 14 mg/dL (ref 8–23)
CO2: 20 mmol/L — ABNORMAL LOW (ref 22–32)
Calcium: 9.4 mg/dL (ref 8.9–10.3)
Chloride: 104 mmol/L (ref 98–111)
Creatinine, Ser: 0.85 mg/dL (ref 0.61–1.24)
GFR calc Af Amer: >60 mL/min (ref >60)
GFR calc non Af Amer: >60 mL/min (ref >60)
Glucose, Bld: 156 mg/dL — ABNORMAL HIGH (ref 70–99)
Potassium: 3.7 mmol/L (ref 3.5–5.1)
Sodium: 137 mmol/L (ref 135–145)
Total Bilirubin: 0.4 mg/dL (ref 0.3–1.2)
Total Protein: 8 g/dL (ref 6.5–8.1)

## 2018-08-25 LAB — LIPASE, BLOOD: Lipase: 29 U/L (ref 11–51)

## 2018-08-25 LAB — TROPONIN I
Troponin I: <0.03 ng/mL (ref <0.03)
Troponin I: <0.03 ng/mL (ref <0.03)

## 2018-08-25 LAB — GLUCOSE, CAPILLARY: Glucose-Capillary: 140 mg/dL — ABNORMAL HIGH (ref 70–99)

## 2018-08-25 LAB — SARS CORONAVIRUS 2 BY RT PCR (HOSPITAL ORDER, PERFORMED IN ~~LOC~~ HOSPITAL LAB): SARS Coronavirus 2: NEGATIVE

## 2018-08-25 MED ORDER — NITROGLYCERIN 0.4 MG SL SUBL: 0.4000 mg | SUBLINGUAL_TABLET | SUBLINGUAL | Status: DC | PRN

## 2018-08-25 MED ORDER — ASPIRIN 81 MG PO CHEW
81.0000 mg | CHEWABLE_TABLET | Freq: Every day | ORAL | Status: DC
  Administered 2018-08-26 – 2018-08-29 (×4): 81 mg via ORAL
  Filled 2018-08-25 (×4): qty 1

## 2018-08-25 MED ORDER — ACETAMINOPHEN 325 MG PO TABS: 650.0000 mg | ORAL_TABLET | ORAL | Status: DC | PRN

## 2018-08-25 MED ORDER — ATORVASTATIN CALCIUM 10 MG PO TABS
10.0000 mg | ORAL_TABLET | Freq: Every day | ORAL | Status: DC
  Administered 2018-08-26 – 2018-08-29 (×4): 10 mg via ORAL
  Filled 2018-08-25 (×4): qty 1

## 2018-08-25 MED ORDER — LOSARTAN POTASSIUM 50 MG PO TABS
50.0000 mg | ORAL_TABLET | Freq: Every day | ORAL | Status: DC
  Administered 2018-08-26 – 2018-08-29 (×4): 50 mg via ORAL
  Filled 2018-08-25 (×4): qty 1

## 2018-08-25 MED ORDER — ONDANSETRON HCL 4 MG/2ML IJ SOLN: 4.0000 mg | Freq: Four times a day (QID) | INTRAMUSCULAR | Status: DC | PRN

## 2018-08-25 MED ORDER — GABAPENTIN 100 MG PO CAPS
100.0000 mg | ORAL_CAPSULE | Freq: Two times a day (BID) | ORAL | Status: DC
  Administered 2018-08-25 – 2018-08-29 (×8): 100 mg via ORAL
  Filled 2018-08-25 (×8): qty 1

## 2018-08-25 MED ORDER — OXYCODONE HCL 5 MG PO TABS
5.0000 mg | ORAL_TABLET | ORAL | Status: DC | PRN
  Administered 2018-08-26 – 2018-08-29 (×16): 5 mg via ORAL
  Filled 2018-08-25 (×17): qty 1

## 2018-08-25 MED ORDER — HEPARIN (PORCINE) 25000 UT/250ML-% IV SOLN
1400.0000 [IU]/h | INTRAVENOUS | Status: DC
  Administered 2018-08-25: 1200 [IU]/h via INTRAVENOUS
  Administered 2018-08-26 (×2): 1400 [IU]/h via INTRAVENOUS
  Filled 2018-08-25 (×2): qty 250

## 2018-08-25 MED ORDER — OXYCODONE-ACETAMINOPHEN 5-325 MG PO TABS
1.0000 | ORAL_TABLET | ORAL | Status: DC | PRN
  Administered 2018-08-25 – 2018-08-29 (×18): 1 via ORAL
  Filled 2018-08-25 (×19): qty 1

## 2018-08-25 MED ORDER — MORPHINE SULFATE (PF) 4 MG/ML IV SOLN
4.0000 mg | Freq: Once | INTRAVENOUS | Status: AC
  Administered 2018-08-25: 4 mg via INTRAVENOUS
  Filled 2018-08-25: qty 1

## 2018-08-25 MED ORDER — OXYCODONE HCL 5 MG PO TABS
5.0000 mg | ORAL_TABLET | Freq: Once | ORAL | Status: AC
  Administered 2018-08-25: 17:00:00 5 mg via ORAL
  Filled 2018-08-25: qty 1

## 2018-08-25 MED ORDER — ASPIRIN 81 MG PO CHEW
324.0000 mg | CHEWABLE_TABLET | Freq: Once | ORAL | Status: AC
  Administered 2018-08-25: 16:00:00 324 mg via ORAL
  Filled 2018-08-25: qty 4

## 2018-08-25 MED ORDER — INSULIN ASPART 100 UNIT/ML ~~LOC~~ SOLN
0.0000 [IU] | Freq: Three times a day (TID) | SUBCUTANEOUS | Status: DC
  Administered 2018-08-26: 1 [IU] via SUBCUTANEOUS
  Administered 2018-08-26 – 2018-08-27 (×4): 2 [IU] via SUBCUTANEOUS
  Administered 2018-08-28: 3 [IU] via SUBCUTANEOUS
  Administered 2018-08-28 – 2018-08-29 (×3): 2 [IU] via SUBCUTANEOUS

## 2018-08-25 MED ORDER — HEPARIN BOLUS VIA INFUSION
4000.0000 [IU] | Freq: Once | INTRAVENOUS | Status: AC
  Administered 2018-08-25: 4000 [IU] via INTRAVENOUS
  Filled 2018-08-25: qty 4000

## 2018-08-25 MED ORDER — NITROGLYCERIN 0.4 MG SL SUBL
0.4000 mg | SUBLINGUAL_TABLET | Freq: Once | SUBLINGUAL | Status: AC
  Administered 2018-08-25: 0.4 mg via SUBLINGUAL
  Filled 2018-08-25: qty 1

## 2018-08-25 MED ORDER — OXYCODONE-ACETAMINOPHEN 10-325 MG PO TABS: 1.0000 | ORAL_TABLET | ORAL | Status: DC | PRN

## 2018-08-25 NOTE — ED Notes (Signed)
ED TO INPATIENT HANDOFF REPORT  ED Nurse Name and Phone #:   S Name/Age/Gender [name] Vigen 68 y.o. male Room/Bed: WA03/WA03  Code Status   Code Status: Prior  Home/SNF/Other Home Patient oriented to: self, place, time and situation Is this baseline? Yes   Triage Complete: Triage complete  Chief Complaint shob;muscle tightness;diarrhea;cough;sore throat  Triage Note Pt c/o SOB, cough, sore throat, diarrhea that started this morning.    Allergies Allergies  Allergen Reactions  . Gabapentin     Dizziness  . Glimepiride Other (See Comments)    Headache, back pain  . Menthol (Topical Analgesic) Rash    Burning rash    Level of Care/Admitting Diagnosis ED Disposition    ED Disposition Condition Comment   Admit  Hospital Area: [facility] [[id number]]  Level of Care: Telemetry [5]  Admit to tele based on following criteria: Monitor for Ischemic changes  Covid Evaluation: N/A  Diagnosis: Chest pain [053976]  Admitting Physician: Lenore Cordia [[id number]]  Attending Physician: Lenore Cordia [[id number]]  PT Class (Do Not Modify): Observation [104]  PT Acc Code (Do Not Modify): Observation [10022]       B Medical/Surgery History Past Medical History:  Diagnosis Date  . Abdominal wall hernia   . Anxiety   . Back pain    with radiculopathy  . Bladder wall thickening 2013   nl rpt CT scan, normal cytology and UCx, released from urology Karsten Ro)  . Chest pain, non-cardiac 2008   adenosine myoview low risk.  EF 60%.  normal cath 2008, deemed atypical /noncardiac, ?discogenic  . Chronic neck pain   . Chronic pain    narcotic dependent ([place])  . Circadian rhythm sleep disorder, irregular sleep-wake type   . Complication of anesthesia    hard to put patient to sleep  . Depression   . Diverticulosis 2013   by CT scan  . Esophageal stricture 06/2007   dilation of stricture  . GERD (gastroesophageal reflux disease)   . Gross hematuria 2013,  2016   negative CT, cystoscopy, cytology, 2016 - thought from E coli UTI s/p appropriate treatment ([place])  . History of cocaine use 2004  . History of epididymitis   . History of prostatitis 08/2011   ?chronic/simmering, treated with 1 mo cipro  . Hyperlipidemia   . Hypertension    Essential  . OSA (obstructive sleep apnea) 2010   mild off CPAP  . Postlaminectomy syndrome of cervical region    s/p ACDF C5-7 2010 ([place])  . Prostate cancer ([place]) 06/16/2012   T2a, Gleason 6 prostate cancer found by R apical nodule with PSA 2.18 Karsten Ro, [place]) 04/2016  . T2DM (type 2 diabetes mellitus) ([place])   . TIA (transient ischemic attack)    cocaine related   Past Surgical History:  Procedure Laterality Date  . ANTERIOR CERVICAL DISCECTOMY  10/28/2008   with fusion (Dr. Rolena Infante)  . CARDIAC CATHETERIZATION  2008   WNL   . CARPAL TUNNEL RELEASE     Right  . CATARACT EXTRACTION Bilateral 12/2014, 01/2015   Dr Tommy Rainwater  . COLONOSCOPY  10/11/2011   severe diverticulosis, int hemorrhoids, rec rpt 5 yrs Carlean Purl)  . DIRECT LARYNGOSCOPY  11/02/2008   Extubation under anesthesia (Dr. Erik Obey)  . ESOPHAGOGASTRODUODENOSCOPY  07/17/07   Esoph. Stricture dilated (Dr. Henrene Pastor)  . ESOPHAGOGASTRODUODENOSCOPY  10/11/2011   WNL Carlean Purl)  . EXCISION MASS NECK N/A 08/29/2016   Procedure: EXCISION MASS POSTERIOR NECK;  Surgeon: Erroll Luna, MD;  Location: [facility];  Service: General;  Laterality: N/A;  . HEMATOMA EVACUATION  10/29/2008   (Dr. Rolena Infante)  . INGUINAL HERNIA REPAIR     bilateral, 5 cm. RLQ Abd Lipoma Excision (Dr. Lucia Gaskins)  . LIPOMA EXCISION  08/2016   posterior neck  . SHOULDER ARTHROSCOPY WITH SUBACROMIAL DECOMPRESSION AND OPEN ROTATOR C Left 01/23/2013   Procedure: LEFT SHOULDER ARTHROSCOPY WITH SUBACROMIAL DECOMPRESSION AND MINI OPEN ROTATOR CUFF REPAIR, AND OPEN DISTAL CLAVICLE RESECTION;  Surgeon: Augustin Schooling, MD;  Location: [facility];  Service: Orthopedics;  Laterality:  Left;  . SHOULDER SURGERY Right 2013   replaced  . TONSILLECTOMY  childhood  . TRIGGER FINGER RELEASE     Right long finger     A IV Location/Drains/Wounds Patient Lines/Drains/Airways Status   Active Line/Drains/Airways    Name:   Placement date:   Placement time:   Site:   Days:   Peripheral IV 08/25/18 Right Antecubital   08/25/18    1522    Antecubital   less than 1   Incision (Closed) 08/29/16 Neck Other (Comment)   08/29/16    0919     726          Intake/Output Last 24 hours No intake or output data in the 24 hours ending 08/25/18 1841  Labs/Imaging Results for orders placed or performed during the hospital encounter of 08/25/18 (from the past 48 hour(s))  Comprehensive metabolic panel     Status: Abnormal   Collection Time: 08/25/18  2:57 PM  Result Value Ref Range   Sodium 137 135 - 145 mmol/L   Potassium 3.7 3.5 - 5.1 mmol/L   Chloride 104 98 - 111 mmol/L   CO2 20 (L) 22 - 32 mmol/L   Glucose, Bld 156 (H) 70 - 99 mg/dL   BUN 14 8 - 23 mg/dL   Creatinine, Ser 0.85 0.61 - 1.24 mg/dL   Calcium 9.4 8.9 - 10.3 mg/dL   Total Protein 8.0 6.5 - 8.1 g/dL   Albumin 4.6 3.5 - 5.0 g/dL   AST 20 15 - 41 U/L   ALT 30 0 - 44 U/L   Alkaline Phosphatase 49 38 - 126 U/L   Total Bilirubin 0.4 0.3 - 1.2 mg/dL   GFR calc non Af Amer >60 >60 mL/min   GFR calc Af Amer >60 >60 mL/min   Anion gap 13 5 - 15    Comment: Performed at [facility], [place] [address], [place], [place] [address]  CBC with Differential     Status: Abnormal   Collection Time: 08/25/18  2:57 PM  Result Value Ref Range   WBC 10.6 (H) 4.0 - 10.5 K/uL   RBC 4.56 4.22 - 5.81 MIL/uL   Hemoglobin 14.1 13.0 - 17.0 g/dL   HCT 41.3 39.0 - 52.0 %   MCV 90.6 80.0 - 100.0 fL   MCH 30.9 26.0 - 34.0 pg   MCHC 34.1 30.0 - 36.0 g/dL   RDW 14.5 11.5 - 15.5 %   Platelets 289 150 - 400 K/uL   nRBC 0.0 0.0 - 0.2 %   Neutrophils Relative % 92 %   Neutro Abs 9.7 (H) 1.7 - 7.7 K/uL   Lymphocytes  Relative 6 %   Lymphs Abs 0.7 0.7 - 4.0 K/uL   Monocytes Relative 1 %   Monocytes Absolute 0.1 0.1 - 1.0 K/uL   Eosinophils Relative 0 %   Eosinophils Absolute 0.0 0.0 - 0.5 K/uL   Basophils  Relative 0 %   Basophils Absolute 0.0 0.0 - 0.1 K/uL   Immature Granulocytes 1 %   Abs Immature Granulocytes 0.07 0.00 - 0.07 K/uL    Comment: Performed at [facility], [place] [address], [place], [place] [address]  Troponin I - Once     Status: None   Collection Time: 08/25/18  2:57 PM  Result Value Ref Range   Troponin I <0.03 <0.03 ng/mL    Comment: Performed at [facility], [place] [address] [place], [place] [address]  Lipase, blood     Status: None   Collection Time: 08/25/18  2:57 PM  Result Value Ref Range   Lipase 29 11 - 51 U/L    Comment: Performed at [facility], [place] [address] [place], [place] [address]  SARS Coronavirus 2 [facility] order, Performed in [facility] hospital lab)     Status: None   Collection Time: 08/25/18  3:32 PM  Result Value Ref Range   SARS Coronavirus 2 NEGATIVE NEGATIVE    Comment: (NOTE) If result is NEGATIVE SARS-CoV-2 target nucleic acids are NOT DETECTED. The SARS-CoV-2 RNA is generally detectable in upper and lower  respiratory specimens during the acute phase of infection. The lowest  concentration of SARS-CoV-2 viral copies this assay can detect is 250  copies / mL. A negative result does not preclude SARS-CoV-2 infection  and should not be used as the sole basis for treatment or other  patient management decisions.  A negative result may occur with  improper specimen collection / handling, submission of specimen other  than nasopharyngeal swab, presence of viral mutation(s) within the  areas targeted by this assay, and inadequate number of viral copies  (<250 copies / mL). A negative result must be combined with clinical  observations, patient history, and epidemiological information. If  result is POSITIVE SARS-CoV-2 target nucleic acids are DETECTED. The SARS-CoV-2 RNA is generally detectable in upper and lower  respiratory specimens dur ing the acute phase of infection.  Positive  results are indicative of active infection with SARS-CoV-2.  Clinical  correlation with patient history and other diagnostic information is  necessary to determine patient infection status.  Positive results do  not rule out bacterial infection or co-infection with other viruses. If result is PRESUMPTIVE POSTIVE SARS-CoV-2 nucleic acids MAY BE PRESENT.   A presumptive positive result was obtained on the submitted specimen  and confirmed on repeat testing.  While 2019 novel coronavirus  (SARS-CoV-2) nucleic acids may be present in the submitted sample  additional confirmatory testing may be necessary for epidemiological  and / or clinical management purposes  to differentiate between  SARS-CoV-2 and other Sarbecovirus currently known to infect humans.  If clinically indicated additional testing with an alternate test  methodology [phone]) is advised. The SARS-CoV-2 RNA is generally  detectable in upper and lower respiratory sp ecimens during the acute  phase of infection. The expected result is Negative. Fact Sheet for Patients:  StrictlyIdeas.no Fact Sheet for Healthcare Providers: BankingDealers.co.za This test is not yet approved or cleared by the [place] FDA and has been authorized for detection and/or diagnosis of SARS-CoV-2 by FDA under an Emergency Use Authorization (EUA).  This EUA will remain in effect (meaning this test can be used) for the duration of the COVID-19 declaration under Section 564(b)(1) of the Act, 21 U.S.C. section 360bbb-3(b)(1), unless the authorization is terminated or revoked sooner. Performed at [facility], [address] [place],  [place] [address]    Dg Chest Portable 1  View  Result Date: 08/25/2018 CLINICAL DATA:  Epigastric pain, shortness of breath EXAM: PORTABLE CHEST 1 VIEW COMPARISON:  12/26/2012 FINDINGS: The heart size and mediastinal contours are within normal limits. Both lungs are clear. The visualized skeletal structures are unremarkable. IMPRESSION: No active disease. Electronically Signed   By: Kathreen Devoid   On: 08/25/2018 15:45    Pending Labs Unresulted Labs (From admission, onward)    Start     Ordered   08/25/18 1803  Protime-INR  Add-on,   R     08/25/18 1802   08/25/18 1803  APTT  Add-on,   R     08/25/18 1802   08/25/18 1803  Troponin I - Add-On to previous collection  Add-on,   STAT     08/25/18 1802          Vitals/Pain Today's Vitals   08/25/18 1731 08/25/18 1812 08/25/18 1821 08/25/18 1831  BP: 131/86 118/79  135/81  Pulse: 88 (!) 113  (!) 118  Resp: 18 18  18   Temp:      TempSrc:      SpO2: 99% 97%  94%  PainSc:   4      Isolation Precautions Droplet and Contact precautions  Medications Medications  heparin bolus via infusion 4,000 Units (has no administration in time range)    Followed by  heparin ADULT infusion 100 units/mL (25000 units/277mL sodium chloride 0.45%) (has no administration in time range)  morphine 4 MG/ML injection 4 mg (4 mg Intravenous Given 08/25/18 1514)  aspirin chewable tablet 324 mg (324 mg Oral Given 08/25/18 1555)  oxyCODONE (Oxy IR/ROXICODONE) immediate release tablet 5 mg (5 mg Oral Given 08/25/18 1715)  nitroGLYCERIN (NITROSTAT) SL tablet 0.4 mg (0.4 mg Sublingual Given 08/25/18 1715)    Mobility walks Low fall risk   Focused Assessments Cardiac Assessment Handoff:    Lab Results  Component Value Date   CKTOTAL 4,038 (H) 11/03/2008   CKMB 2.4 11/02/2007   TROPONINI <0.03 08/25/2018   Lab Results  Component Value Date   DDIMER  03/14/2007    0.28        AT THE INHOUSE ESTABLISHED CUTOFF VALUE OF 0.48 ug/mL FEU, THIS ASSAY HAS BEEN DOCUMENTED IN THE LITERATURE TO HAVE    Does the Patient currently have chest pain? No     R Recommendations: See Admitting Provider Note  Report given to:   Additional Notes:

## 2018-08-25 NOTE — ED Provider Notes (Signed)
[name]   CSN: [id number] Arrival date & time: 08/25/18  1414    History   Chief Complaint Chief Complaint  Patient presents with  . Shortness of Breath  . Cough  . Diarrhea  . Sore Throat    HPI [name] is a 68 y.o. male.     HPI  Patient is a 68 year old male with past medical history of type 2 diabetes mellitus, GERD, esophageal stricture, TIA, history of cocaine use (remote, 1980s) presenting for sudden onset of shortness of breath, pain in bilateral arms, epigastric cramping that began in 2 to 3 hours prior to arrival.  Patient reports that he went out to perform an errand when he came back was walking to his house he felt unwell with shortness of breath and achiness in bilateral arms, left greater than right.  He denies specific chest pain but did feel that his pain was worse in the epigastrium.  Patient reported some nausea but no vomiting.  Patient reports a slight nonproductive cough.  No hemoptysis.  Patient reports that he had some looser stools today, but no diarrhea.  No melena or hematochezia.  Patient reports that he believes he had a heart attack "in the 14s" but was unsure.  He denies history of cardiac catheterization or stent placement.  He has previously seen a cardiologist but cannot remember why he saw 1.  Unsure when his last stress test was.  Patient does report that he has been evaluated by his primary care provider for possible pancreatic neoplasm, however his MRI most recently performed within the last month was normal.  Past Medical History:  Diagnosis Date  . Abdominal wall hernia   . Anxiety   . Back pain    with radiculopathy  . Bladder wall thickening 2013   nl rpt CT scan, normal cytology and UCx, released from urology Karsten Ro)  . Chest pain, non-cardiac 2008   adenosine myoview low risk.  EF 60%.  normal cath 2008, deemed atypical /noncardiac, ?discogenic  . Chronic neck pain   .  Chronic pain    narcotic dependent ([place])  . Circadian rhythm sleep disorder, irregular sleep-wake type   . Complication of anesthesia    hard to put patient to sleep  . Depression   . Diverticulosis 2013   by CT scan  . Esophageal stricture 06/2007   dilation of stricture  . GERD (gastroesophageal reflux disease)   . Gross hematuria 2013, 2016   negative CT, cystoscopy, cytology, 2016 - thought from E coli UTI s/p appropriate treatment ([place])  . History of cocaine use 2004  . History of epididymitis   . History of prostatitis 08/2011   ?chronic/[place]ing, treated with 1 mo cipro  . Hyperlipidemia   . Hypertension    Essential  . OSA (obstructive sleep apnea) 2010   mild off CPAP  . Postlaminectomy syndrome of cervical region    s/p ACDF C5-7 2010 ([place])  . Prostate cancer ([place]) 06/16/2012   T2a, Gleason 6 prostate cancer found by R apical nodule with PSA 2.18 Karsten Ro, [place]) 04/2016  . T2DM (type 2 diabetes mellitus) ([place])   . TIA (transient ischemic attack)    cocaine related    Patient Active Problem List   Diagnosis Date Noted  . Tongue lesion 08/05/2018  . Fatty liver 07/31/2018  . Mass of pancreas 07/31/2018  . Right sided abdominal pain 07/28/2018  . Right arm pain 07/28/2018  . Balanitis 08/06/2017  .  Lightheadedness 08/06/2017  . Erectile dysfunction 08/01/2015  . Health maintenance examination 01/31/2015  . Gross hematuria   . Lipoma of neck 10/01/2014  . Allergic rhinitis 10/01/2014  . Diabetic neuropathy ([place]) 05/31/2014  . Advanced care planning/counseling discussion 01/26/2014  . Cough 06/24/2013  . Lower abdominal pain 04/20/2013  . OSA (obstructive sleep apnea)   . Tinea cruris 10/08/2012  . Skin rash 10/01/2012  . Prostate cancer ([place]) 06/16/2012  . Headache(784.0) 02/12/2012  . Severe obesity (BMI 35.0-39.9) with comorbidity ([place]) 01/08/2012  . Esophageal dysphagia 10/10/2011  . Diverticulosis 10/09/2011  . Male sexual dysfunction  09/11/2011  . Insomnia 06/19/2011  . Shoulder pain 04/08/2011  . Neck pain, chronic 02/24/2011  . Medicare annual wellness visit, subsequent 10/26/2010  . Iron deficiency anemia 07/01/2009  . GERD 09/29/2008  . Essential hypertension 01/01/2008  . ANXIETY DEPRESSION 03/19/2007  . TIA (transient ischemic attack) 03/05/2007  . Back pain with radiculopathy 12/31/2006  . ABDOMINAL WALL HERNIA 10/03/2006  . Diabetes mellitus type 2, uncontrolled, with complications ([place]) 70/26/3785  . COCAINE ABUSE, HX OF 10/01/2006  . HYPERCHOLESTEROLEMIA, PURE 08/21/2005    Past Surgical History:  Procedure Laterality Date  . ANTERIOR CERVICAL DISCECTOMY  10/28/2008   with fusion (Dr. Rolena Infante)  . CARDIAC CATHETERIZATION  2008   WNL   . CARPAL TUNNEL RELEASE     Right  . CATARACT EXTRACTION Bilateral 12/2014, 01/2015   Dr Tommy Rainwater  . COLONOSCOPY  10/11/2011   severe diverticulosis, int hemorrhoids, rec rpt 5 yrs Carlean Purl)  . DIRECT LARYNGOSCOPY  11/02/2008   Extubation under anesthesia (Dr. Erik Obey)  . ESOPHAGOGASTRODUODENOSCOPY  07/17/07   [place]oph. Stricture dilated (Dr. Henrene Pastor)  . ESOPHAGOGASTRODUODENOSCOPY  10/11/2011   WNL Carlean Purl)  . EXCISION MASS NECK N/A 08/29/2016   Procedure: EXCISION MASS POSTERIOR NECK;  Surgeon: Erroll Luna, MD;  Location: [facility];  Service: General;  Laterality: N/A;  . HEMATOMA EVACUATION  10/29/2008   (Dr. Rolena Infante)  . INGUINAL HERNIA REPAIR     bilateral, 5 cm. RLQ Abd Lipoma Excision (Dr. Lucia Gaskins)  . LIPOMA EXCISION  08/2016   posterior neck  . SHOULDER ARTHROSCOPY WITH SUBACROMIAL DECOMPRESSION AND OPEN ROTATOR C Left 01/23/2013   Procedure: LEFT SHOULDER ARTHROSCOPY WITH SUBACROMIAL DECOMPRESSION AND MINI OPEN ROTATOR CUFF REPAIR, AND OPEN DISTAL CLAVICLE RESECTION;  Surgeon: Augustin Schooling, MD;  Location: [facility];  Service: Orthopedics;  Laterality: Left;  . SHOULDER SURGERY Right 2013   replaced  . TONSILLECTOMY  childhood  . TRIGGER FINGER  RELEASE     Right long finger        Home Medications    Prior to Admission medications   Medication Sig Start Date End Date Taking? Authorizing Provider  aspirin EC 81 MG tablet Take 81 mg by mouth daily. 10/25/11   Thurnell Lose, MD  atorvastatin (LIPITOR) 10 MG tablet TAKE 1 TABLET BY MOUTH  DAILY 05/14/18   Ria Bush, MD  baclofen (LIORESAL) 10 MG tablet TAKE 1 TABLET BY MOUTH AT  BEDTIME AS NEEDED FOR BACK  SPASMS 07/28/18   Ria Bush, MD  chlorhexidine (PERIDEX) 0.12 % solution Use as directed 15 mLs in the mouth or throat 2 (two) times daily. 08/14/18   Ria Bush, MD  empagliflozin (JARDIANCE) 10 MG TABS tablet Take 10 mg by mouth daily. 07/30/18   Ria Bush, MD  fluticasone [facility]) 50 MCG/ACT nasal spray USE 2 SPRAYS IN [facility]  NOSTRIL DAILY 03/27/17   Ria Bush, MD  gabapentin (NEURONTIN)  100 MG capsule TAKE 1 CAPSULE BY MOUTH TWO TIMES DAILY 07/28/18   Ria Bush, MD  glipiZIDE (GLUCOTROL XL) 10 MG 24 hr tablet TAKE 1 TABLET BY MOUTH  DAILY WITH BREAKFAST Patient not taking: Reported on 08/14/2018 06/03/17   Ria Bush, MD  glucose blood (ONE TOUCH ULTRA TEST) test strip USE WITH METER TO CHECK  SUGAR TWICE DAILY. 03/10/18   Ria Bush, MD  losartan (COZAAR) 50 MG tablet TAKE 1 TABLET BY MOUTH  DAILY 08/12/18   Ria Bush, MD  metFORMIN (GLUCOPHAGE) 1000 MG tablet TAKE 1 TABLET BY MOUTH 2  TIMES DAILY WITH MEALS 07/28/18   Ria Bush, MD  oxyCODONE-acetaminophen (PERCOCET) 10-325 MG tablet oxycodone-acetaminophen 10 mg-325 mg tablet  TK 1 T PO Q 4 H PRN    [provider]  triamcinolone (KENALOG) 0.1 % paste Use as directed 1 application in the mouth or throat 2 (two) times daily. 08/08/18   Ria Bush, MD    Family History Family History  Problem Relation Age of Onset  . Diabetes Mother   . Hypertension Mother   . Hyperlipidemia Mother   . Diabetes Father   . Diabetes Sister   . Hypertension  Sister   . Hypertension Brother   . Diabetes Brother   . Diabetes Brother   . Hypertension Brother   . Diabetes Brother   . Heart disease Maternal Uncle        Heart failure  . Cancer Paternal Uncle        Prostate  . Cancer Paternal Grandfather        Prostate  . Stroke Neg Hx     Social History Social History   Tobacco Use  . Smoking status: Never Smoker  . Smokeless tobacco: Never Used  Substance Use Topics  . Alcohol use: No    Alcohol/week: 0.0 standard drinks  . Drug use: No    Comment: H/O marijuana (last 2005); Cocaine 5/04     Allergies   Gabapentin; Glimepiride; and Menthol (topical analgesic)   Review of Systems Review of Systems  Constitutional: Negative for chills and fever.  HENT: Negative for congestion and sore throat.   Eyes: Negative for visual disturbance.  Respiratory: Positive for shortness of breath. Negative for cough and chest tightness.   Cardiovascular: Negative for chest pain, palpitations and leg swelling.  Gastrointestinal: Positive for abdominal pain and nausea. Negative for diarrhea and vomiting.  Genitourinary: Negative for dysuria and flank pain.  Musculoskeletal: Negative for back pain and myalgias.  Skin: Negative for rash.  Neurological: Negative for dizziness, syncope, light-headedness and headaches.  Psychiatric/Behavioral: The patient is nervous/anxious.      Physical Exam Updated Vital Signs BP 140/76   Pulse 96   Temp 98.7 F (37.1 C) (Oral)   Resp 18   SpO2 100%   Physical Exam Vitals signs and nursing [name] reviewed.  Constitutional:      General: He is not in acute distress.    Appearance: He is well-developed. He is not ill-appearing or diaphoretic.     Comments: Appears anxious.   HENT:     Head: Normocephalic and atraumatic.  Eyes:     Conjunctiva/sclera: Conjunctivae normal.     Pupils: Pupils are equal, round, and reactive to light.  Neck:     Musculoskeletal: Normal range of motion and neck supple.   Cardiovascular:     Rate and Rhythm: Regular rhythm. Tachycardia present.     Pulses:  Radial pulses are 2+ on the right side and 2+ on the left side.       Dorsalis pedis pulses are 2+ on the right side and 2+ on the left side.       Posterior tibial pulses are 2+ on the right side and 2+ on the left side.     Heart sounds: S1 normal and S2 normal. No murmur.     Comments: No lower extremity edema.  No calf tenderness. Pulmonary:     Effort: Pulmonary effort is normal. No respiratory distress.     Breath sounds: Normal breath sounds. No wheezing or rales.  Abdominal:     General: There is no distension.     Palpations: Abdomen is soft.     Tenderness: There is abdominal tenderness. There is no guarding or rebound.     Comments: Patient has generalized abdominal discomfort, worse in the epigastrium and right upper quadrant.  Musculoskeletal: Normal range of motion.        General: No deformity.  Lymphadenopathy:     Cervical: No cervical adenopathy.  Skin:    General: Skin is warm and dry.     Findings: No erythema or rash.  Neurological:     Mental Status: He is alert.     Comments: Cranial nerves grossly intact. Patient moves extremities symmetrically and with good coordination.  Psychiatric:        Behavior: Behavior normal.        Thought Content: Thought content normal.        Judgment: Judgment normal.      ED Treatments / Results  Labs (all labs ordered are listed, but only abnormal results are displayed) Labs Reviewed  COMPREHENSIVE METABOLIC PANEL - Abnormal; Notable for the following components:      Result Value   CO2 20 (*)    Glucose, Bld 156 (*)    All other components within normal limits  CBC WITH DIFFERENTIAL/PLATELET - Abnormal; Notable for the following components:   WBC 10.6 (*)    Neutro Abs 9.7 (*)    All other components within normal limits  SARS CORONAVIRUS 2 (HOSPITAL ORDER, [place] [facility])  TROPONIN I   LIPASE, BLOOD  PROTIME-INR  APTT  TROPONIN I    EKG None  ED ECG REPORT   Date: 08/25/2018  Rate: 105   Rhythm: normal sinus rhythm  QRS Axis: normal  Intervals: normal  ST/T Wave abnormalities: T wave inversions in anterior leads and also lead I and II. New since 09/09/16.  Conduction Disutrbances:none  Narrative Interpretation:   Old EKG Reviewed: changes noted  I have personally reviewed the EKG tracing and agree with the computerized printout as noted.   Radiology Dg Chest Portable 1 View  Result Date: 08/25/2018 CLINICAL DATA:  Epigastric pain, shortness of breath EXAM: PORTABLE CHEST 1 VIEW COMPARISON:  12/26/2012 FINDINGS: The heart size and mediastinal contours are within normal limits. Both lungs are clear. The visualized skeletal structures are unremarkable. IMPRESSION: No active disease. Electronically Signed   By: Kathreen Devoid   On: 08/25/2018 15:45    Procedures Procedures (including critical care time)  CRITICAL CARE Performed by: Albesa Seen   Total critical care time: 35 minutes  Critical care time was exclusive of separately billable procedures and treating other patients.  Critical care was necessary to treat or prevent imminent or life-threatening deterioration.  Critical care was time spent personally by me on the following activities: development of treatment  plan with patient and/or surrogate as well as nursing, discussions with consultants, evaluation of patient's response to treatment, examination of patient, obtaining history from patient or surrogate, ordering and performing treatments and interventions, ordering and review of laboratory studies, ordering and review of radiographic studies, pulse oximetry and re-evaluation of patient's condition.   Medications Ordered in ED Medications  morphine 4 MG/ML injection 4 mg (has no administration in time range)     Initial Impression / Assessment and Plan / ED Course  I have reviewed the  triage vital signs and the nursing notes.  Pertinent labs & imaging results that were available during my care of the patient were reviewed by me and considered in my medical decision making (see chart for details).  Clinical Course as of Aug 24 1804  Mon Aug 25, 2018  1752 SpO2: 100 % [AM]  1753 Spoke with Dr. Doristine Bosworth who will admit pt. Appreciate his involvement. Pt having active pain in room although lessened (reports nitro helped). Will place on heparin.   [AM]    Clinical Course User Index [AM] Albesa Seen, PA-C       This is a 19-year male with several chronic medical problems and several cardiovascular risk factors including type 2 diabetes mellitus, obesity, and history of cocaine use presenting for shortness of breath, epigastric discomfort, and bilateral arm aching.  Do have concerns for ACS versus unstable angina.  Given ASA and nitro. Will run cardiac enzymes, obtain EKG, chest x-ray.  Differential diagnosis also includes thoracic aortic dissection, perforated viscus, although less suspicious for these pathologies based on equal pulses in all extremities and a benign abdominal exam.  We will also evaluate patient for pancreatitis.  Patient did have a recent abdominal ultrasound which demonstrated no evidence of aortic aneurysm.  He also had a normal MRI without biliary pathology performed within the last month.  Regards her starting slight leukocytosis.  His troponin is negative.  He does have dynamic EKG changes with T wave inversions in the anterior leads as well as lead I and II, new from May 2018.  Patient continues to have active chest pain, will start patient heparin.  Chest x-ray without cardiopulmonary disease, reviewed by me.  Lipase is negative.  Patient to be admitted to hospital medicine for unstable angina/ACS rule out.  He was placed on heparin in the emergency department for active pain.  This is a shared visit with Dr. Varney Biles. Patient was independently  evaluated by this attending physician. Attending physician consulted in evaluation and disposition management.  Final Clinical Impressions(s) / ED Diagnoses   Final diagnoses:  Shortness of breath  Epigastric pain    ED Discharge Orders    None       Tamala Julian 08/25/18 Thomasena Edis, MD 08/26/18 1553

## 2018-08-25 NOTE — H&P (Signed)
History and Physical    Robert MCMILLON TIR:[id number] DOB: 1950/12/31 DOA: 08/25/2018  PCP: Ria Bush, MD  Patient coming from: Home  I have personally briefly reviewed patient's old medical records in [place]  Chief Complaint: Abdominal and chest pain  HPI: [name] is a 68 y.o. male with medical history significant for type 2 diabetes, hypertension, hyperlipidemia, GERD, prostate cancer, OSA not requiring CPAP, depression/anxiety, chronic pain, and remote history of substance use (cocaine in 1980s) who presented to the ED with new onset dyspnea on exertion and abdominal/chest pain.  Patient states he was in usual usual state of health when he went to the grocery store to pick up his home medications.  He returned home and while walking from his car to the house he felt as if he was getting short of breath.  He sat down and noticed having right upper quadrant abdominal pain as well as central chest pain which radiated to his left chest wall with associated numbness and tingling down both of his arms.  Symptoms lasted about 30 minutes and improved with rest.  He reports a recent cough productive of sometimes yellow sometimes clear sputum over the last several days which has not occurred so far today.  He reports intermittent hot and cold spells.  He denies any nausea, vomiting, palpitations, or dysuria.  He denies any recent heavy lifting or strenuous exercise.  He denies any falls or injuries.  He denies any significant tobacco or alcohol use.  He reports a remote history of marijuana and cocaine use in the 1980s and denies any use since then.  He previously had a cardiac catheterization on 12/31/2007 by Dr. 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  Per review of records, he has been having right upper quadrant pain for about 2 months and had an abdominal ultrasound on 07/31/2018 which showed changes consistent with fatty liver,  negative for gallstones, and 1.8 x 1.3 x 1.7 cm hypoechoic lesion at the head of the pancreas.  MRI abdomen with and without contrast on 08/07/2018 was obtained and was negative for evidence of pancreatic mass or other significant abnormality.  ED Course:  Initial vitals showed BP 140/76, pulse 96, RR 18, temp 98.7 Fahrenheit, SPO2 100% on room air.  Labs are notable for WBC 10.6, hemoglobin 14.1, platelets 289, sodium 137, potassium 3.7, BUN 14, creatinine 0.85, serum glucose 156, LFTs within normal limits, troponin I negative x 2, lipase 29, SARS-CoV-2 negative.  Portable chest x-ray showed clear lung fields without focal consolidation, infiltrate, effusion, or interstitial edema.  EKG shows sinus tachycardia with T WI in leads I, II and V4-V5 with T wave flattening in other leads.  Rate is faster and T wave changes are new when compared to prior.  Patient was given aspirin 324 mg, sublingual nitroglycerin, morphine 4 mg IV once, and started on heparin anticoagulation.  Hospitalist service was consulted for further evaluation management.  Review of Systems: As per HPI otherwise 10 point review of systems negative.    Past Medical History:  Diagnosis Date   Abdominal wall hernia    Anxiety    Back pain    with radiculopathy   Bladder wall thickening 2013   nl rpt CT scan, normal cytology and UCx, released from urology Karsten Ro)   Chest pain, non-cardiac 2008   adenosine myoview low risk.  EF 60%.  normal cath 2008, deemed atypical /noncardiac, ?discogenic   Chronic neck pain  Chronic pain    narcotic dependent ([place])   Circadian rhythm sleep disorder, irregular sleep-wake type    Complication of anesthesia    hard to put patient to sleep   Depression    Diverticulosis 2013   by CT scan   Esophageal stricture 06/2007   dilation of stricture   GERD (gastroesophageal reflux disease)    Gross hematuria 2013, 2016   negative CT, cystoscopy, cytology, 2016 - thought  from E coli UTI s/p appropriate treatment ([place])   History of cocaine use 2004   History of epididymitis    History of prostatitis 08/2011   ?chronic/simmering, treated with 1 mo cipro   Hyperlipidemia    Hypertension    Essential   OSA (obstructive sleep apnea) 2010   mild off CPAP   Postlaminectomy syndrome of cervical region    s/p ACDF C5-7 2010 ([place])   Prostate cancer ([place]) 06/16/2012   T2a, Gleason 6 prostate cancer found by R apical nodule with PSA 2.18 Karsten Ro, [place]) 04/2016   T2DM (type 2 diabetes mellitus) ([place])    TIA (transient ischemic attack)    cocaine related    Past Surgical History:  Procedure Laterality Date   ANTERIOR CERVICAL DISCECTOMY  10/28/2008   with fusion (Dr. Rolena Infante)   [place]  2008   WNL    CARPAL TUNNEL RELEASE     Right   CATARACT EXTRACTION Bilateral 12/2014, 01/2015   Dr Tommy Rainwater   COLONOSCOPY  10/11/2011   severe diverticulosis, int hemorrhoids, rec rpt 5 yrs Carlean Purl)   DIRECT LARYNGOSCOPY  11/02/2008   Extubation under anesthesia (Dr. Erik Obey)   ESOPHAGOGASTRODUODENOSCOPY  07/17/07   Esoph. Stricture dilated (Dr. Henrene Pastor)   ESOPHAGOGASTRODUODENOSCOPY  10/11/2011   WNL Carlean Purl)   EXCISION MASS NECK N/A 08/29/2016   Procedure: EXCISION MASS POSTERIOR NECK;  Surgeon: Erroll Luna, MD;  Location: [facility];  Service: General;  Laterality: N/A;   HEMATOMA EVACUATION  10/29/2008   (Dr. Rolena Infante)   [place]     bilateral, 5 cm. RLQ Abd Lipoma Excision (Dr. Lucia Gaskins)   LIPOMA EXCISION  08/2016   posterior neck   SHOULDER ARTHROSCOPY WITH SUBACROMIAL DECOMPRESSION AND OPEN ROTATOR C Left 01/23/2013   Procedure: LEFT SHOULDER ARTHROSCOPY WITH SUBACROMIAL DECOMPRESSION AND MINI OPEN ROTATOR CUFF REPAIR, AND OPEN DISTAL CLAVICLE RESECTION;  Surgeon: Augustin Schooling, MD;  Location: [facility];  Service: Orthopedics;  Laterality: Left;   SHOULDER SURGERY Right 2013   replaced    TONSILLECTOMY  childhood   TRIGGER FINGER RELEASE     Right long finger     reports that he has never smoked. He has never used smokeless tobacco. He reports that he does not drink alcohol or use drugs.  Allergies  Allergen Reactions   Gabapentin     Dizziness   Glimepiride Other (See Comments)    Headache, back pain   Menthol (Topical Analgesic) Rash    Burning rash    Family History  Problem Relation Age of Onset   Diabetes Mother    Hypertension Mother    Hyperlipidemia Mother    Diabetes Father    Diabetes Sister    Hypertension Sister    Hypertension Brother    Diabetes Brother    Diabetes Brother    Hypertension Brother    Diabetes Brother    Heart disease Maternal Uncle        Heart failure   Cancer Paternal Uncle  Prostate   Cancer Paternal Grandfather        Prostate   Stroke Neg Hx      Prior to Admission medications   Medication Sig Start Date End Date Taking? Authorizing Provider  aspirin EC 81 MG tablet Take 81 mg by mouth daily. 10/25/11  Yes Thurnell Lose, MD  atorvastatin (LIPITOR) 10 MG tablet TAKE 1 TABLET BY MOUTH  DAILY Patient taking differently: Take 10 mg by mouth daily.  05/14/18  Yes Ria Bush, MD  baclofen (LIORESAL) 10 MG tablet TAKE 1 TABLET BY MOUTH AT  BEDTIME AS NEEDED FOR BACK  SPASMS Patient taking differently: Take 10 mg by mouth at bedtime as needed for muscle spasms.  07/28/18  Yes Ria Bush, MD  empagliflozin (JARDIANCE) 10 MG TABS tablet Take 10 mg by mouth daily. 07/30/18  Yes Ria Bush, MD  fluticasone (FLONASE) 50 MCG/ACT nasal spray USE 2 SPRAYS IN EACH  NOSTRIL DAILY Patient taking differently: Place 2 sprays into both nostrils daily.  03/27/17  Yes Ria Bush, MD  gabapentin (NEURONTIN) 100 MG capsule TAKE 1 CAPSULE BY MOUTH TWO TIMES DAILY Patient taking differently: Take 100 mg by mouth 2 (two) times daily.  07/28/18  Yes Ria Bush, MD  glipiZIDE (GLUCOTROL XL)  10 MG 24 hr tablet TAKE 1 TABLET BY MOUTH  DAILY WITH BREAKFAST Patient taking differently: Take 10 mg by mouth daily with breakfast.  06/03/17  Yes Ria Bush, MD  metFORMIN (GLUCOPHAGE) 1000 MG tablet TAKE 1 TABLET BY MOUTH 2  TIMES DAILY WITH MEALS Patient taking differently: Take 1,000 mg by mouth 2 (two) times daily with a meal.  07/28/18  Yes Ria Bush, MD  oxyCODONE-acetaminophen (PERCOCET) 10-325 MG tablet Take 1 tablet by mouth every 4 (four) hours as needed for pain.    Yes [provider]  chlorhexidine (PERIDEX) 0.12 % solution Use as directed 15 mLs in the mouth or throat 2 (two) times daily. Patient not taking: Reported on 08/25/2018 08/14/18   Ria Bush, MD  glucose blood (ONE TOUCH ULTRA TEST) test strip USE WITH METER TO CHECK  SUGAR TWICE DAILY. 03/10/18   Ria Bush, MD  losartan (COZAAR) 50 MG tablet TAKE 1 TABLET BY MOUTH  DAILY Patient taking differently: Take 50 mg by mouth daily.  08/12/18   Ria Bush, MD  triamcinolone (KENALOG) 0.1 % paste Use as directed 1 application in the mouth or throat 2 (two) times daily. Patient not taking: Reported on 08/25/2018 08/08/18   Ria Bush, MD    Physical Exam: Vitals:   08/25/18 1731 08/25/18 1812 08/25/18 1831 08/25/18 1936  BP: 131/86 118/79 135/81 126/69  Pulse: 88 (!) 113 (!) 118 96  Resp: 18 18 18 18   Temp:    100 F (37.8 C)  TempSrc:    Oral  SpO2: 99% 97% 94% 96%    Constitutional: Obese man resting supine in bed, NAD, calm, comfortable Eyes: PERRL, lids and conjunctivae normal ENMT: Mucous membranes are moist. Posterior pharynx clear of any exudate or lesions.Normal dentition.  Neck: normal, supple, no masses. Respiratory: clear to auscultation bilaterally, no wheezing, no crackles. Normal respiratory effort. No accessory muscle use.  Cardiovascular: Regular rate and rhythm, no murmurs / rubs / gallops. No extremity edema. 2+ pedal pulses. Abdomen: Tender to palpation  in right upper quadrant and epigastric regions, no masses palpated. No hepatosplenomegaly. Bowel sounds positive.  Musculoskeletal: no clubbing / cyanosis. No joint deformity upper and lower extremities. Good ROM, no contractures. Normal  muscle tone.  No reproducible chest wall tenderness. Skin: no rashes, lesions, ulcers. No induration Neurologic: CN 2-12 grossly intact. Sensation intact, Strength 5/5 in all 4.  Psychiatric: Normal judgment and insight. Alert and oriented x 3. Normal mood.     Labs on Admission: I have personally reviewed following labs and imaging studies  CBC: Recent Labs  Lab 08/25/18 1457  WBC 10.6*  NEUTROABS 9.7*  HGB 14.1  HCT 41.3  MCV 90.6  PLT 767   Basic Metabolic Panel: Recent Labs  Lab 08/25/18 1457  NA 137  K 3.7  CL 104  CO2 20*  GLUCOSE 156*  BUN 14  CREATININE 0.85  CALCIUM 9.4   GFR: CrCl cannot be calculated (Unknown ideal weight.). Liver Function Tests: Recent Labs  Lab 08/25/18 1457  AST 20  ALT 30  ALKPHOS 49  BILITOT 0.4  PROT 8.0  ALBUMIN 4.6   Recent Labs  Lab 08/25/18 1457  LIPASE 29   No results for input(s): AMMONIA in the last 168 hours. Coagulation Profile: No results for input(s): INR, PROTIME in the last 168 hours. Cardiac Enzymes: Recent Labs  Lab 08/25/18 1457 08/25/18 1830  TROPONINI <0.03 <0.03   BNP (last 3 results) No results for input(s): PROBNP in the last 8760 hours. HbA1C: No results for input(s): HGBA1C in the last 72 hours. CBG: No results for input(s): GLUCAP in the last 168 hours. Lipid Profile: No results for input(s): CHOL, HDL, LDLCALC, TRIG, CHOLHDL, LDLDIRECT in the last 72 hours. Thyroid Function Tests: No results for input(s): TSH, T4TOTAL, FREET4, T3FREE, THYROIDAB in the last 72 hours. Anemia Panel: No results for input(s): VITAMINB12, FOLATE, FERRITIN, TIBC, IRON, RETICCTPCT in the last 72 hours. Urine analysis:    Component Value Date/Time   COLORURINE YELLOW  10/09/2011 [place] 10/09/2011 1818   LABSPEC 1.020 03/18/2013 2010   PHURINE 7.0 03/18/2013 2010   GLUCOSEU NEGATIVE 03/18/2013 2010   HGBUR NEGATIVE 03/18/2013 2010   HGBUR negative 02/28/2010 0822   BILIRUBINUR negative 07/28/2018 [place] 03/18/2013 2010   PROTEINUR Positive (A) 07/28/2018 1127   PROTEINUR NEGATIVE 03/18/2013 2010   UROBILINOGEN 0.2 07/28/2018 1127   UROBILINOGEN 0.2 03/18/2013 2010   NITRITE negatitve 07/28/2018 1127   NITRITE NEGATIVE 03/18/2013 2010   LEUKOCYTESUR Negative 07/28/2018 1127    Radiological Exams on Admission: Dg Chest Portable 1 View  Result Date: 08/25/2018 CLINICAL DATA:  Epigastric pain, shortness of breath EXAM: PORTABLE CHEST 1 VIEW COMPARISON:  12/26/2012 FINDINGS: The heart size and mediastinal contours are within normal limits. Both lungs are clear. The visualized skeletal structures are unremarkable. IMPRESSION: No active disease. Electronically Signed   By: Kathreen Devoid   On: 08/25/2018 15:45    EKG: Independently reviewed. EKG shows sinus tachycardia with T WI in leads I, II and V4-V5 with T wave flattening in other leads.  Rate is faster and T wave changes are new when compared to prior.  Assessment/Plan Principal Problem:   Chest pain Active Problems:   Diabetes mellitus type 2, uncontrolled, with complications (HCC)   HYPERCHOLESTEROLEMIA, PURE   Essential hypertension  ROMELO SCIANDRA is a 68 y.o. male with medical history significant for type 2 diabetes, hypertension, hyperlipidemia, GERD, prostate cancer, OSA not requiring CPAP, depression/anxiety, chronic pain, and remote history of substance use (cocaine in 1980s) who is admitted for chest pain rule out.   Atypical chest pain versus angina: Patient with mixed symptoms.  He has some new T  wave changes on EKG.  Cardiac enzymes are negative so far.  Last cardiac cath was in 2009 by Dr. Johnsie Cancel which showed nonobstructive CAD with 30%  proximal and mid LAD lesions at which time he was considered to have hypertensive heart disease.  He was chest pain-free at the time of my examination. -Admit to telemetry -Continue heparin anticoagulation for now -Troponin I negative x2, repeat once more and trend if rising -Obtain echocardiogram -Repeat EKG in a.m. -Continue aspirin 81 mg daily, sublingual nitroglycerin as needed  Right upper quadrant pain: Per PCP notes, this has been ongoing for about 2 months now.  Work-up with abdominal ultrasound and MRI of abdomen last month were unrevealing.  LFTs are within normal notes this admission.  At this time, favor musculoskeletal process.  Will continue to monitor.  Type 2 diabetes: A1c 9.2 on 07/28/2018.  Outpatient regimen includes metformin, glipizide, and empagliflozin.  Will hold oral meds while in hospital. -Start sensitive SSI  Hypertension: Currently stable.  Continue home losartan.  Hyperlipidemia: Continue atorvastatin.  Chronic pain and neuropathy: Continue home Percocet as needed and gabapentin.   DVT prophylaxis: On heparin anticoagulation Code Status: Full code, confirmed with patient Family Communication: None present on admission. Disposition Plan: Pending clinical progress Consults called: None Admission status: Observation   Zada Finders MD [facility] Pager [phone]  If 7PM-7AM, please contact night-coverage [email or web address]  08/25/2018, 7:43 PM

## 2018-08-25 NOTE — Telephone Encounter (Signed)
Noted. plz call tomorrow for an update.  

## 2018-08-25 NOTE — ED Triage Notes (Signed)
Pt c/o SOB, cough, sore throat, diarrhea that started this morning.

## 2018-08-25 NOTE — Telephone Encounter (Signed)
Patient is having shaking, dizziness, hot and cold flashes, cramping in arms and stomach, SOB- this morning. Patient states at first he thought his symptoms were due to his glucose- but that is normal. Patient is very shaky and he is panicky- asked him if he was alone and he is. Asked if he needed 911- he states he has a neighbor he can call- he is going to call neighbor- I am going to call him back in 5 minutes to make sure he got her. Call to patient and he is on his way to hospital- patient states he is going to [facility]. Did ot finish protocol- patient was so upset and unnerved he needed to go to ED or have EMS check him.  Answer Assessment - Initial Assessment Questions 1. COVID-19 DIAGNOSIS: "Who made your Coronavirus (COVID-19) diagnosis?" "Was it confirmed by a positive lab test?" If not diagnosed by a HCP, ask "Are there lots of cases (community spread) where you live?" (See public health department website, if unsure)   * MAJOR community spread: high number of cases; numbers of cases are increasing; many people hospitalized.   * MINOR community spread: low number of cases; not increasing; few or no people hospitalized     Minor- community spread 2. ONSET: "When did the COVID-19 symptoms start?"      Yesterday- glucose got better- shaking all pver 3. WORST SYMPTOM: "What is your worst symptom?" (e.g., cough, fever, shortness of breath, muscle aches)     Shaking, muscle tightness 4. COUGH: "Do you have a cough?" If so, ask: "How bad is the cough?"       Slight cough yesterday 5. FEVER: "Do you have a fever?" If so, ask: "What is your temperature, how was it measured, and when did it start?"     Not checked 6. RESPIRATORY STATUS: "Describe your breathing?" (e.g., shortness of breath, wheezing, unable to speak)      SOB , shaking and cramping in arms and stomach 7. BETTER-SAME-WORSE: "Are you getting better, staying the same or getting worse compared to yesterday?"  If getting worse, ask, "In what  way?"     Worse today 8. HIGH RISK DISEASE: "Do you have any chronic medical problems?" (e.g., asthma, heart or lung disease, weak immune system, etc.)     Diabetic , geriatric  9. PREGNANCY: "Is there any chance you are pregnant?" "When was your last menstrual period?"     n/a 10. OTHER SYMPTOMS: "Do you have any other symptoms?"  (e.g., runny nose, headache, sore throat, loss of smell)       Dizziness, cramping  Protocols used: CORONAVIRUS (COVID-19) DIAGNOSED OR SUSPECTED-A-AH

## 2018-08-25 NOTE — ED Notes (Signed)
ED Provider at bedside. 

## 2018-08-25 NOTE — Telephone Encounter (Signed)
Carmelina Paddock RN also noted;  Did not finish triage- patient was alone and offered to call EMS for him- he states he had neighbor who could help him- recheck- 5 minute- he is on his way to ED.   Routing comment    Per chart review tab pt is at [employer].

## 2018-08-25 NOTE — Progress Notes (Signed)
ANTICOAGULATION CONSULT NOTE - Initial Consult  Pharmacy Consult for Heparin Indication: chest pain/ACS  Allergies  Allergen Reactions  . Gabapentin     Dizziness  . Glimepiride Other (See Comments)    Headache, back pain  . Menthol (Topical Analgesic) Rash    Burning rash    Patient Measurements:   Heparin Dosing Weight: 92kg  Vital Signs: Temp: 98.7 F (37.1 C) (05/04 1423) Temp Source: Oral (05/04 1423) BP: 131/86 (05/04 1731) Pulse Rate: 88 (05/04 1731)  Labs: Recent Labs    08/25/18 1457  HGB 14.1  HCT 41.3  PLT 289  CREATININE 0.85  TROPONINI <0.03    CrCl cannot be calculated (Unknown ideal weight.).   Medical History: Past Medical History:  Diagnosis Date  . Abdominal wall hernia   . Anxiety   . Back pain    with radiculopathy  . Bladder wall thickening 2013   nl rpt CT scan, normal cytology and UCx, released from urology Karsten Ro)  . Chest pain, non-cardiac 2008   adenosine myoview low risk.  EF 60%.  normal cath 2008, deemed atypical /noncardiac, ?discogenic  . Chronic neck pain   . Chronic pain    narcotic dependent ([place])  . Circadian rhythm sleep disorder, irregular sleep-wake type   . Complication of anesthesia    hard to put patient to sleep  . Depression   . Diverticulosis 2013   by CT scan  . Esophageal stricture 06/2007   dilation of stricture  . GERD (gastroesophageal reflux disease)   . Gross hematuria 2013, 2016   negative CT, cystoscopy, cytology, 2016 - thought from E coli UTI s/p appropriate treatment ([place])  . History of cocaine use 2004  . History of epididymitis   . History of prostatitis 08/2011   ?chronic/simmering, treated with 1 mo cipro  . Hyperlipidemia   . Hypertension    Essential  . OSA (obstructive sleep apnea) 2010   mild off CPAP  . Postlaminectomy syndrome of cervical region    s/p ACDF C5-7 2010 ([place])  . Prostate cancer ([place]) 06/16/2012   T2a, Gleason 6 prostate cancer found by R apical nodule  with PSA 2.18 Karsten Ro, [place]) 04/2016  . T2DM (type 2 diabetes mellitus) ([place])   . TIA (transient ischemic attack)    cocaine related    Medications:  Infusions:   Assessment: 68 yo M presents with sudden onset ShOB.  EKG changes noted. Troponin negative.  Pharmacy consulted to start IV heparin for ACS.  CBC WNL.  Baseline coags pending.   Goal of Therapy:  Heparin level 0.3-0.7 units/ml Monitor platelets by anticoagulation protocol: Yes   Plan:  Give 4000 units bolus x 1 Start heparin infusion at 1200 units/hr Check anti-Xa level in 6 hours and daily while on heparin Continue to monitor H&H and platelets  Yuriana Gaal, Lavonia Drafts 08/25/2018,5:46 PM

## 2018-08-26 ENCOUNTER — Observation Stay (HOSPITAL_COMMUNITY): Payer: Medicare Other

## 2018-08-26 ENCOUNTER — Observation Stay (HOSPITAL_BASED_OUTPATIENT_CLINIC_OR_DEPARTMENT_OTHER): Payer: Medicare Other

## 2018-08-26 DIAGNOSIS — I361 Nonrheumatic tricuspid (valve) insufficiency: Secondary | ICD-10-CM | POA: Diagnosis not present

## 2018-08-26 DIAGNOSIS — R0602 Shortness of breath: Secondary | ICD-10-CM | POA: Diagnosis not present

## 2018-08-26 LAB — GLUCOSE, CAPILLARY
Glucose-Capillary: 158 mg/dL — ABNORMAL HIGH (ref 70–99)
Glucose-Capillary: 129 mg/dL — ABNORMAL HIGH (ref 70–99)
Glucose-Capillary: 137 mg/dL — ABNORMAL HIGH (ref 70–99)
Glucose-Capillary: 150 mg/dL — ABNORMAL HIGH (ref 70–99)

## 2018-08-26 LAB — CBC
HCT: 42.9 % (ref 39.0–52.0)
HCT: 42.1 % (ref 39.0–52.0)
Hemoglobin: 14.3 g/dL (ref 13.0–17.0)
Hemoglobin: 13.7 g/dL (ref 13.0–17.0)
MCH: 30.8 pg (ref 26.0–34.0)
MCH: 30.1 pg (ref 26.0–34.0)
MCHC: 33.3 g/dL (ref 30.0–36.0)
MCHC: 32.5 g/dL (ref 30.0–36.0)
MCV: 92.3 fL (ref 80.0–100.0)
MCV: 92.5 fL (ref 80.0–100.0)
Platelets: 251 10*3/uL (ref 150–400)
Platelets: 279 10*3/uL (ref 150–400)
RBC: 4.65 MIL/uL (ref 4.22–5.81)
RBC: 4.55 MIL/uL (ref 4.22–5.81)
RDW: 14.8 % (ref 11.5–15.5)
RDW: 14.9 % (ref 11.5–15.5)
WBC: 18.1 10*3/uL — ABNORMAL HIGH (ref 4.0–10.5)
WBC: 18.6 10*3/uL — ABNORMAL HIGH (ref 4.0–10.5)
nRBC: 0 % (ref 0.0–0.2)
nRBC: 0 % (ref 0.0–0.2)

## 2018-08-26 LAB — BASIC METABOLIC PANEL
Anion gap: 13 (ref 5–15)
BUN: 11 mg/dL (ref 8–23)
CO2: 21 mmol/L — ABNORMAL LOW (ref 22–32)
Calcium: 9.3 mg/dL (ref 8.9–10.3)
Chloride: 104 mmol/L (ref 98–111)
Creatinine, Ser: 0.81 mg/dL (ref 0.61–1.24)
GFR calc Af Amer: >60 mL/min (ref >60)
GFR calc non Af Amer: >60 mL/min (ref >60)
Glucose, Bld: 150 mg/dL — ABNORMAL HIGH (ref 70–99)
Potassium: 3.7 mmol/L (ref 3.5–5.1)
Sodium: 138 mmol/L (ref 135–145)

## 2018-08-26 LAB — PROCALCITONIN: Procalcitonin: 17.85 ng/mL

## 2018-08-26 LAB — ECHOCARDIOGRAM COMPLETE

## 2018-08-26 LAB — PROTIME-INR
INR: 1 (ref 0.8–1.2)
Prothrombin Time: 13.4 seconds (ref 11.4–15.2)

## 2018-08-26 LAB — INFLUENZA PANEL BY PCR (TYPE A & B)
Influenza A By PCR: NEGATIVE
Influenza B By PCR: NEGATIVE

## 2018-08-26 LAB — HEPARIN LEVEL (UNFRACTIONATED): Heparin Unfractionated: <0.1 IU/mL — ABNORMAL LOW (ref 0.30–0.70)

## 2018-08-26 LAB — D-DIMER, QUANTITATIVE: D-Dimer, Quant: 0.72 ug/mL-FEU — ABNORMAL HIGH (ref 0.00–0.50)

## 2018-08-26 LAB — TROPONIN I: Troponin I: <0.03 ng/mL (ref <0.03)

## 2018-08-26 LAB — CREATININE, SERUM
Creatinine, Ser: 0.86 mg/dL (ref 0.61–1.24)
GFR calc Af Amer: >60 mL/min (ref >60)
GFR calc non Af Amer: >60 mL/min (ref >60)

## 2018-08-26 LAB — APTT: aPTT: 78 seconds — ABNORMAL HIGH (ref 24–36)

## 2018-08-26 LAB — HIV ANTIBODY (ROUTINE TESTING W REFLEX): HIV Screen 4th Generation wRfx: NONREACTIVE

## 2018-08-26 MED ORDER — ENOXAPARIN SODIUM 40 MG/0.4ML ~~LOC~~ SOLN
40.0000 mg | SUBCUTANEOUS | Status: DC
  Administered 2018-08-26 – 2018-08-28 (×3): 40 mg via SUBCUTANEOUS
  Filled 2018-08-26 (×3): qty 0.4

## 2018-08-26 MED ORDER — MAGNESIUM HYDROXIDE 400 MG/5ML PO SUSP
30.0000 mL | Freq: Every day | ORAL | Status: DC | PRN
  Administered 2018-08-26 – 2018-08-27 (×3): 30 mL via ORAL
  Filled 2018-08-26 (×2): qty 30

## 2018-08-26 MED ORDER — IOHEXOL 350 MG/ML SOLN
100.0000 mL | Freq: Once | INTRAVENOUS | Status: AC | PRN
  Administered 2018-08-26: 100 mL via INTRAVENOUS

## 2018-08-26 MED ORDER — SODIUM CHLORIDE (PF) 0.9 % IJ SOLN
INTRAMUSCULAR | Status: AC
  Administered 2018-08-26: 10 mL
  Filled 2018-08-26: qty 50

## 2018-08-26 MED ORDER — ZOLPIDEM TARTRATE 5 MG PO TABS
5.0000 mg | ORAL_TABLET | Freq: Every day | ORAL | Status: AC
  Administered 2018-08-26: 5 mg via ORAL
  Filled 2018-08-26: qty 1

## 2018-08-26 MED ORDER — ALUM & MAG HYDROXIDE-SIMETH 200-200-20 MG/5ML PO SUSP: 30.0000 mL | ORAL | Status: DC | PRN

## 2018-08-26 NOTE — Progress Notes (Signed)
  Echocardiogram 2D Echocardiogram has been performed.  Darlina Sicilian M 08/26/2018, 8:15 AM

## 2018-08-26 NOTE — Progress Notes (Signed)
PROGRESS NOTE    [name]  ZDG:[id number] DOB: 04-Feb-1951 DOA: 08/25/2018 PCP: Ria Bush, MD   Brief Narrative:  [name] is a 68 y.o. male with medical history significant for type 2 diabetes, hypertension, hyperlipidemia, GERD, prostate cancer, OSA not requiring CPAP, depression/anxiety, chronic pain, and remote history of substance use (cocaine in 1980s) who presented to the ED on 08/25/2018 with new onset dyspnea on exertion and abdominal/chest pain and some tingling and pain in the both arms, more in the left than the right.  Patient is EKG showed new T wave inversion in anterior leads and lateral leads.  Although patient's troponin was negative but he was started on heparin drip.  Subsequent troponins have been negative x3 so far.  Patient had low-grade fever.  Chest x-ray was negative he was tested negative for COVID-19 as well as influenza and respiratory viral panel.  His d-dimer was elevated for his age so CT angiogram of the chest was done which was unremarkable as well.  When seen this morning, he still had intermittent chest pain.  I discussed the case with cardiology and we are going to proceed with nuclear stress test in the morning.   Consultants:   none  Procedures:   none  Antimicrobials:   None   Subjective: Patient seen and examined earlier today.  He stated that although he feels better but he still has left anterior chest pain which is intermittent and sharp in nature.  Also had some shortness of breath associated with it.  Afebrile.  No other complaint.  Objective: Vitals:   08/25/18 1936 08/25/18 2345 08/26/18 0524 08/26/18 1351  BP: 126/69 102/65 136/75 134/78  Pulse: 96 (!) 103 92 91  Resp: 18 18 18 15   Temp: 100 F (37.8 C) (!) 100.6 F (38.1 C) 99.6 F (37.6 C) 99.9 F (37.7 C)  TempSrc: Oral Oral Oral Oral  SpO2: 96% 96% 97% 97%    Intake/Output Summary (Last 24 hours) at 08/26/2018 1450 Last data filed at 08/26/2018 1054  Gross per 24 hour  Intake 159.87 ml  Output 1375 ml  Net -1215.13 ml   There were no vitals filed for this visit.  Examination:  General exam: Appears calm and comfortable  Respiratory system: Clear to auscultation. Respiratory effort normal. Cardiovascular system: S1 & S2 heard, RRR. No JVD, murmurs, rubs, gallops or clicks. No pedal edema. Gastrointestinal system: Abdomen is nondistended, soft and nontender. No organomegaly or masses felt. Normal bowel sounds heard. Central nervous system: Alert and oriented. No focal neurological deficits. Extremities: Symmetric 5 x 5 power. Skin: No rashes, lesions or ulcers Psychiatry: Judgement and insight appear normal. Mood & affect appropriate.    Data Reviewed: I have personally reviewed following labs and imaging studies  CBC: Recent Labs  Lab 08/25/18 1457 08/26/18 0201  WBC 10.6* 18.1*  NEUTROABS 9.7*  --   HGB 14.1 14.3  HCT 41.3 42.9  MCV 90.6 92.3  PLT 289 595   Basic Metabolic Panel: Recent Labs  Lab 08/25/18 1457 08/26/18 0201  NA 137 138  K 3.7 3.7  CL 104 104  CO2 20* 21*  GLUCOSE 156* 150*  BUN 14 11  CREATININE 0.85 0.81  CALCIUM 9.4 9.3   GFR: CrCl cannot be calculated (Unknown ideal weight.). Liver Function Tests: Recent Labs  Lab 08/25/18 1457  AST 20  ALT 30  ALKPHOS 49  BILITOT 0.4  PROT 8.0  ALBUMIN 4.6   Recent Labs  Lab 08/25/18 1457  LIPASE 29   No results for input(s): AMMONIA in the last 168 hours. Coagulation Profile: Recent Labs  Lab 08/25/18 2040  INR 1.0   Cardiac Enzymes: Recent Labs  Lab 08/25/18 1457 08/25/18 1830 08/26/18 0201  TROPONINI <0.03 <0.03 <0.03   BNP (last 3 results) No results for input(s): PROBNP in the last 8760 hours. HbA1C: No results for input(s): HGBA1C in the last 72 hours. CBG: Recent Labs  Lab 08/25/18 2003 08/26/18 0746 08/26/18 1128  GLUCAP 140* 158* 129*   Lipid Profile: No results for input(s): CHOL, HDL, LDLCALC, TRIG,  CHOLHDL, LDLDIRECT in the last 72 hours. Thyroid Function Tests: No results for input(s): TSH, T4TOTAL, FREET4, T3FREE, THYROIDAB in the last 72 hours. Anemia Panel: No results for input(s): VITAMINB12, FOLATE, FERRITIN, TIBC, IRON, RETICCTPCT in the last 72 hours. Sepsis Labs: Recent Labs  Lab 08/26/18 0201  PROCALCITON 17.85    Recent Results (from the past 240 hour(s))  SARS Coronavirus 2 [facility] order, Performed in [facility] hospital lab)     Status: None   Collection Time: 08/25/18  3:32 PM  Result Value Ref Range Status   SARS Coronavirus 2 NEGATIVE NEGATIVE Final    Comment: (NOTE) If result is NEGATIVE SARS-CoV-2 target nucleic acids are NOT DETECTED. The SARS-CoV-2 RNA is generally detectable in upper and lower  respiratory specimens during the acute phase of infection. The lowest  concentration of SARS-CoV-2 viral copies this assay can detect is 250  copies / mL. A negative result does not preclude SARS-CoV-2 infection  and should not be used as the sole basis for treatment or other  patient management decisions.  A negative result may occur with  improper specimen collection / handling, submission of specimen other  than nasopharyngeal swab, presence of viral mutation(s) within the  areas targeted by this assay, and inadequate number of viral copies  (<250 copies / mL). A negative result must be combined with clinical  observations, patient history, and epidemiological information. If result is POSITIVE SARS-CoV-2 target nucleic acids are DETECTED. The SARS-CoV-2 RNA is generally detectable in upper and lower  respiratory specimens dur ing the acute phase of infection.  Positive  results are indicative of active infection with SARS-CoV-2.  Clinical  correlation with patient history and other diagnostic information is  necessary to determine patient infection status.  Positive results do  not rule out bacterial infection or co-infection with other viruses. If  result is PRESUMPTIVE POSTIVE SARS-CoV-2 nucleic acids MAY BE PRESENT.   A presumptive positive result was obtained on the submitted specimen  and confirmed on repeat testing.  While 2019 novel coronavirus  (SARS-CoV-2) nucleic acids may be present in the submitted sample  additional confirmatory testing may be necessary for epidemiological  and / or clinical management purposes  to differentiate between  SARS-CoV-2 and other Sarbecovirus currently known to infect humans.  If clinically indicated additional testing with an alternate test  methodology [phone]) is advised. The SARS-CoV-2 RNA is generally  detectable in upper and lower respiratory sp ecimens during the acute  phase of infection. The expected result is Negative. Fact Sheet for Patients:  StrictlyIdeas.no Fact Sheet for Healthcare Providers: BankingDealers.co.za This test is not yet approved or cleared by the [place] FDA and has been authorized for detection and/or diagnosis of SARS-CoV-2 by FDA under an Emergency Use Authorization (EUA).  This EUA will remain in effect (meaning this test can be used) for the duration of the COVID-19 declaration under Section 564(b)(1) of the  Act, 21 U.S.C. section 360bbb-3(b)(1), unless the authorization is terminated or revoked sooner. Performed at [facility], [place] [address] [place], [place] [address]       Radiology Studies: Ct Angio Chest Pe W Or Wo Contrast  Result Date: 08/26/2018 CLINICAL DATA:  Shortness of breath, fever and cough beginning yesterday. Upper abdominal pain. EXAM: CT ANGIOGRAPHY CHEST WITH CONTRAST TECHNIQUE: Multidetector CT imaging of the chest was performed using the standard protocol during bolus administration of intravenous contrast. Multiplanar CT image reconstructions and MIPs were obtained to evaluate the vascular anatomy. CONTRAST:  148mL OMNIPAQUE IOHEXOL 350 MG/ML SOLN COMPARISON:   Chest radiography same day FINDINGS: Cardiovascular: Pulmonary arterial opacification is good. There are no pulmonary emboli. Heart size is normal. Patient does not have advanced coronary artery calcification. No pericardial effusion. The aorta shows mild atherosclerosis but no aneurysm or dissection. Mediastinum/Nodes: No mediastinal or hilar mass or lymphadenopathy. Lungs/Pleura: No infiltrate, collapse or effusion. Possible bronchitis pattern. No evidence of bronchiectasis. No emphysema. Upper Abdomen: Negative Musculoskeletal: Ordinary thoracic degenerative changes. Previous ACDF C7-T1. Review of the MIP images confirms the above findings. IMPRESSION: No pulmonary emboli. No pulmonary infiltrate, collapse or effusion.  Possible bronchitis. Electronically Signed   By: Nelson Chimes M.D.   On: 08/26/2018 14:41   Dg Chest Portable 1 View  Result Date: 08/25/2018 CLINICAL DATA:  Epigastric pain, shortness of breath EXAM: PORTABLE CHEST 1 VIEW COMPARISON:  12/26/2012 FINDINGS: The heart size and mediastinal contours are within normal limits. Both lungs are clear. The visualized skeletal structures are unremarkable. IMPRESSION: No active disease. Electronically Signed   By: Kathreen Devoid   On: 08/25/2018 15:45    Scheduled Meds: . aspirin  81 mg Oral Daily  . atorvastatin  10 mg Oral Daily  . gabapentin  100 mg Oral BID  . insulin aspart  0-9 Units Subcutaneous TID WC  . losartan  50 mg Oral Daily  . sodium chloride (PF)       Continuous Infusions: . heparin 1,400 Units/hr (08/26/18 0927)     LOS: 0 days   Assessment & Plan:   Principal Problem:   Chest pain Active Problems:   Diabetes mellitus type 2, uncontrolled, with complications (HCC)   HYPERCHOLESTEROLEMIA, PURE   Essential hypertension   Atypical chest pain versus angina: Still has chest pain.  Ruled out of MI with negative cardiac enzymes.  Ruled out of PE with negative CT angiogram.  Discussed case with cardiologist Dr. Meda Coffee and  we are going to proceed with nuclear stress in the morning.  Echo does not show any acute pathology.  Right upper quadrant pain: This has resolved. Per PCP notes, this has been ongoing for about 2 months now.  Work-up with abdominal ultrasound and MRI of abdomen last month were unrevealing.  LFTs are within normal notes this admission.    Type 2 diabetes: A1c 9.2 on 07/28/2018.  Outpatient regimen includes metformin, glipizide, and empagliflozin.  Will hold oral meds while in hospital.  Continue sensitive SSI  Hypertension: Currently stable.  Continue home losartan.  Hyperlipidemia: Continue atorvastatin.  Chronic pain and neuropathy: Continue home Percocet as needed and gabapentin.  Low-grade fever with leukocytosis: No signs of infection.  UA negative.  Ruled out of influenza and respiratory viral panel.  Procalcitonin pending.  Hold off antibiotics repeat CBC tomorrow.  DVT prophylaxis: SCD and Lovenox Code Status: Full code Family Communication: Discussed with patient Disposition Plan: To be determined,   Time spent: 40 minutes  Darliss Cheney, MD [facility] Pager [phone]  If 7PM-7AM, please contact night-coverage [email or web address] Password [email or web address] 08/26/2018, 2:50 PM

## 2018-08-26 NOTE — Care Management Obs Status (Signed)
[facility] NOTIFICATION   Patient Details  Name: ELIAB CLOSSON MRN: [id number] Date of Birth: 05-20-1950   Medicare Observation Status Notification Given:  Yes    MahabirJuliann Pulse, RN 08/26/2018, 2:58 PM

## 2018-08-26 NOTE — Telephone Encounter (Signed)
Spoke with pt asking how he is doing. Says he is doing ok. Admitted to [facility] ED. Says he was initially told he had a heart attack. But they are still running more tests today.

## 2018-08-26 NOTE — Telephone Encounter (Signed)
Noted  

## 2018-08-26 NOTE — Progress Notes (Signed)
ANTICOAGULATION CONSULT NOTE - Follow Up Consult  Pharmacy Consult for Heparin Indication: chest pain/ACS  Allergies  Allergen Reactions  . Gabapentin     Dizziness  . Glimepiride Other (See Comments)    Headache, back pain  . Menthol (Topical Analgesic) Rash    Burning rash    Patient Measurements:   Heparin Dosing Weight:   Vital Signs: Temp: 99.6 F (37.6 C) (05/05 0524) Temp Source: Oral (05/05 0524) BP: 136/75 (05/05 0524) Pulse Rate: 92 (05/05 0524)  Labs: Recent Labs    08/25/18 1457 08/25/18 1830 08/25/18 2040 08/26/18 0201  HGB 14.1  --   --  14.3  HCT 41.3  --   --  42.9  PLT 289  --   --  251  APTT  --   --  78*  --   LABPROT  --   --  13.4  --   INR  --   --  1.0  --   HEPARINUNFRC  --   --   --  <0.10*  CREATININE 0.85  --   --  0.81  TROPONINI <0.03 <0.03  --  <0.03    CrCl cannot be calculated (Unknown ideal weight.).   Medications:  Infusions:  . heparin Stopped (08/26/18 0737)    Assessment: Patient with low heparin level.  No heparin issues per RN.  Goal of Therapy:  Heparin level 0.3-0.7 units/ml Monitor platelets by anticoagulation protocol: Yes   Plan:  Increase heparin to 1400 units/hr Recheck level at 1500  [address] [name] 08/26/2018,6:06 AM

## 2018-08-27 ENCOUNTER — Ambulatory Visit (HOSPITAL_COMMUNITY)
Admit: 2018-08-27 | Discharge: 2018-08-27 | Disposition: A | Discharge status: Home / Self Care | Payer: Medicare Other | Source: Ambulatory Visit | Attending: Family Medicine | Admitting: Family Medicine

## 2018-08-27 ENCOUNTER — Other Ambulatory Visit: Payer: Self-pay | Admitting: Pharmacy Technician

## 2018-08-27 DIAGNOSIS — Z6834 Body mass index (BMI) 34.0-34.9, adult: Secondary | ICD-10-CM | POA: Diagnosis not present

## 2018-08-27 DIAGNOSIS — R509 Fever, unspecified: Secondary | ICD-10-CM

## 2018-08-27 DIAGNOSIS — Z9841 Cataract extraction status, right eye: Secondary | ICD-10-CM | POA: Diagnosis not present

## 2018-08-27 DIAGNOSIS — Z560 Unemployment, unspecified: Secondary | ICD-10-CM | POA: Diagnosis not present

## 2018-08-27 DIAGNOSIS — R079 Chest pain, unspecified: Secondary | ICD-10-CM | POA: Insufficient documentation

## 2018-08-27 DIAGNOSIS — E785 Hyperlipidemia, unspecified: Secondary | ICD-10-CM | POA: Diagnosis present

## 2018-08-27 DIAGNOSIS — Z79899 Other long term (current) drug therapy: Secondary | ICD-10-CM | POA: Diagnosis not present

## 2018-08-27 DIAGNOSIS — I10 Essential (primary) hypertension: Secondary | ICD-10-CM | POA: Diagnosis not present

## 2018-08-27 DIAGNOSIS — R0789 Other chest pain: Secondary | ICD-10-CM

## 2018-08-27 DIAGNOSIS — Z9842 Cataract extraction status, left eye: Secondary | ICD-10-CM | POA: Diagnosis not present

## 2018-08-27 DIAGNOSIS — G8929 Other chronic pain: Secondary | ICD-10-CM | POA: Diagnosis present

## 2018-08-27 DIAGNOSIS — E669 Obesity, unspecified: Secondary | ICD-10-CM | POA: Diagnosis present

## 2018-08-27 DIAGNOSIS — K760 Fatty (change of) liver, not elsewhere classified: Secondary | ICD-10-CM | POA: Diagnosis present

## 2018-08-27 DIAGNOSIS — Z8546 Personal history of malignant neoplasm of prostate: Secondary | ICD-10-CM | POA: Diagnosis not present

## 2018-08-27 DIAGNOSIS — E1165 Type 2 diabetes mellitus with hyperglycemia: Secondary | ICD-10-CM

## 2018-08-27 DIAGNOSIS — R1011 Right upper quadrant pain: Secondary | ICD-10-CM | POA: Diagnosis present

## 2018-08-27 DIAGNOSIS — Z7951 Long term (current) use of inhaled steroids: Secondary | ICD-10-CM | POA: Diagnosis not present

## 2018-08-27 DIAGNOSIS — K219 Gastro-esophageal reflux disease without esophagitis: Secondary | ICD-10-CM | POA: Diagnosis present

## 2018-08-27 DIAGNOSIS — R9439 Abnormal result of other cardiovascular function study: Secondary | ICD-10-CM | POA: Diagnosis not present

## 2018-08-27 DIAGNOSIS — Z20828 Contact with and (suspected) exposure to other viral communicable diseases: Secondary | ICD-10-CM | POA: Diagnosis present

## 2018-08-27 DIAGNOSIS — Z7984 Long term (current) use of oral hypoglycemic drugs: Secondary | ICD-10-CM | POA: Diagnosis not present

## 2018-08-27 DIAGNOSIS — E7800 Pure hypercholesterolemia, unspecified: Secondary | ICD-10-CM | POA: Diagnosis not present

## 2018-08-27 DIAGNOSIS — E118 Type 2 diabetes mellitus with unspecified complications: Secondary | ICD-10-CM

## 2018-08-27 DIAGNOSIS — I771 Stricture of artery: Secondary | ICD-10-CM | POA: Diagnosis present

## 2018-08-27 DIAGNOSIS — E119 Type 2 diabetes mellitus without complications: Secondary | ICD-10-CM | POA: Diagnosis present

## 2018-08-27 DIAGNOSIS — G4733 Obstructive sleep apnea (adult) (pediatric): Secondary | ICD-10-CM | POA: Diagnosis present

## 2018-08-27 DIAGNOSIS — Z7982 Long term (current) use of aspirin: Secondary | ICD-10-CM | POA: Diagnosis not present

## 2018-08-27 DIAGNOSIS — Z981 Arthrodesis status: Secondary | ICD-10-CM | POA: Diagnosis not present

## 2018-08-27 DIAGNOSIS — R0602 Shortness of breath: Secondary | ICD-10-CM | POA: Diagnosis present

## 2018-08-27 DIAGNOSIS — M542 Cervicalgia: Secondary | ICD-10-CM | POA: Diagnosis present

## 2018-08-27 DIAGNOSIS — I119 Hypertensive heart disease without heart failure: Secondary | ICD-10-CM | POA: Diagnosis present

## 2018-08-27 LAB — GLUCOSE, CAPILLARY
Glucose-Capillary: 165 mg/dL — ABNORMAL HIGH (ref 70–99)
Glucose-Capillary: 177 mg/dL — ABNORMAL HIGH (ref 70–99)
Glucose-Capillary: 158 mg/dL — ABNORMAL HIGH (ref 70–99)
Glucose-Capillary: 167 mg/dL — ABNORMAL HIGH (ref 70–99)

## 2018-08-27 LAB — RESPIRATORY PANEL BY PCR

## 2018-08-27 LAB — CBC WITH DIFFERENTIAL/PLATELET
Abs Immature Granulocytes: 0.21 10*3/uL — ABNORMAL HIGH (ref 0.00–0.07)
Basophils Absolute: 0 10*3/uL (ref 0.0–0.1)
Basophils Relative: 0 %
Eosinophils Absolute: 0.1 10*3/uL (ref 0.0–0.5)
Eosinophils Relative: 0 %
HCT: 39.8 % (ref 39.0–52.0)
Hemoglobin: 13.3 g/dL (ref 13.0–17.0)
Immature Granulocytes: 1 %
Lymphocytes Relative: 8 %
Lymphs Abs: 1.2 10*3/uL (ref 0.7–4.0)
MCH: 29.8 pg (ref 26.0–34.0)
MCHC: 33.4 g/dL (ref 30.0–36.0)
MCV: 89.2 fL (ref 80.0–100.0)
Monocytes Absolute: 1.4 10*3/uL — ABNORMAL HIGH (ref 0.1–1.0)
Monocytes Relative: 9 %
Neutro Abs: 12.5 10*3/uL — ABNORMAL HIGH (ref 1.7–7.7)
Neutrophils Relative %: 82 %
Platelets: 237 10*3/uL (ref 150–400)
RBC: 4.46 MIL/uL (ref 4.22–5.81)
RDW: 14.6 % (ref 11.5–15.5)
WBC: 15.4 10*3/uL — ABNORMAL HIGH (ref 4.0–10.5)
nRBC: 0 % (ref 0.0–0.2)

## 2018-08-27 LAB — PROCALCITONIN: Procalcitonin: 11.43 ng/mL

## 2018-08-27 MED ORDER — TECHNETIUM TC 99M TETROFOSMIN IV KIT
10.0000 | PACK | Freq: Once | INTRAVENOUS | Status: AC | PRN
  Administered 2018-08-27: 09:00:00 10 via INTRAVENOUS

## 2018-08-27 MED ORDER — REGADENOSON 0.4 MG/5ML IV SOLN
INTRAVENOUS | Status: AC
  Filled 2018-08-27: qty 5

## 2018-08-27 MED ORDER — REGADENOSON 0.4 MG/5ML IV SOLN
0.4000 mg | Freq: Once | INTRAVENOUS | Status: AC
  Administered 2018-08-27: 0.4 mg via INTRAVENOUS

## 2018-08-27 MED ORDER — TECHNETIUM TC 99M TETROFOSMIN IV KIT
30.0000 | PACK | Freq: Once | INTRAVENOUS | Status: AC | PRN
  Administered 2018-08-27: 11:00:00 30 via INTRAVENOUS

## 2018-08-27 NOTE — Patient Outreach (Signed)
[facility]) Care Management  08/27/2018  [name] 18-Dec-1950 [id number]    Follow up call placed to [employer] regarding patient assistance application(s) for Laurene Footman confirms patient was approved as of 5/4 until 04/23/19. Medication to ship out by end of week and then will arrive at patients home in 7-10 business days.  Follow up:  Patient currently admitted, will follow care.  Maud Deed Chana Bode Lambert Certified Pharmacy Technician [place] Management Direct [phone]

## 2018-08-27 NOTE — Progress Notes (Signed)
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  Patient is EKG showed new T wave inversion in anterior leads and lateral leads.  Although patient's troponin was negative but he was started on heparin drip.  Subsequent troponins have been negative x3 so far.  Patient had low-grade fever.  Chest x-ray was negative he was tested negative for COVID-19 as well as influenza and respiratory viral panel.  His d-dimer was elevated for his age so CT angiogram of the chest was done which was unremarkable as well. Issues being addressed:   Subjective: Seen this morning, post nuclear stress test.  No chest pain or abdominal pain.  Complains of fever low-grade overnight and also dry cough.  Assessment & Plan:   Principal Problem:   Chest pain Active Problems:   Diabetes mellitus type 2, uncontrolled, with complications (HCC)   HYPERCHOLESTEROLEMIA, PURE   Essential hypertension  Atypical chest pain with serial negative troponin, CTPA negative, discussed with Dr. Meda Coffee from cardiology and proceeded with stress test 5/6. On asa 81, lipitor.  low-grade fever with leukocytosis currently no obvious infection noted with unremarkable urinalysis, influenza and respiratory virus panel and COVID 19 negative. procal is significantly up at 11.4,.unclear cause, obtain blood culture to rule out bacterial infection. CBC with leukocytosis 18.6, down trended to 15.6. We will hold off on antibiotics unless has temperature  spike again.   RUQ pain, resolved.  Ongoing for 2 months, additional work-up with MRI of the abdomen and ultrasound unrevealing.  Currently electrically stable. T2 DM stable, a1c 9.1  In April. On OHA. continue home regimen HTN: Controlled on losartan. HLD- cont Lipitor. Chronic pain/neuropathy on home pain regimen and Neurontin.  DVT prophylaxis: lovenox Code Status:  Family Communication: Plan of care discussed with patient extensively Disposition Plan: remains inpatient pending clinical improvement.  Monitor temperature curve, follow-up blood culture.  Consultants:  Cardiology  Procedures: Stress test  Antimicrobials: Anti-infectives (From admission, onward)   None       Objective: Vitals:   08/27/18 1044 08/27/18 1048 08/27/18 1049 08/27/18 1051  BP: 118/73 113/72 105/71 115/66  Pulse:      Resp:      Temp:      TempSrc:      SpO2:      Weight:        Intake/Output Summary (Last 24 hours) at 08/27/2018 1251 Last data filed at 08/26/2018 1700 Gross per 24 hour  Intake 386.71 ml  Output -  Net 386.71 ml   Filed Weights   08/26/18 1500  Weight: 103.7 kg   Weight change:   Body mass index is 34.76 kg/m.  Intake/Output from previous day: 05/05 0701 - 05/06 0700 In: 386.7 [P.O.:240; I.V.:146.7] Out: 475 [Urine:475] Intake/Output this shift: No intake/output data recorded.  Examination:  General exam: Appears calm and comfortable,Not in distress, older fore the age HEENT:PERRL,Oral mucosa moist, Ear/Nose normal on gross exam Respiratory system: Bilateral equal air entry, normal vesicular breath sounds, no wheezes or crackles  Cardiovascular system: S1 & S2 heard,No JVD,  murmurs. Gastrointestinal system: Abdomen is  soft, non tender, non distended, BS +  Nervous System:Alert and oriented. No focal neurological deficits/moving extremities, sensation intact. Extremities: No edema, no clubbing, distal peripheral pulses palpable. Skin: No rashes, lesions, no  icterus MSK: Normal muscle bulk,tone ,power  Medications:  Scheduled Meds: . aspirin  81 mg Oral Daily  . atorvastatin  10 mg Oral Daily  . enoxaparin (LOVENOX) injection  40 mg Subcutaneous Q24H  . gabapentin  100 mg Oral BID  . insulin aspart  0-9 Units Subcutaneous TID WC  . losartan  50 mg Oral Daily   Continuous Infusions:  Data Reviewed: I have personally reviewed following labs and imaging studies  CBC: Recent Labs  Lab 08/25/18 1457 08/26/18 0201 08/26/18 1524 08/27/18 0532  WBC 10.6* 18.1* 18.6* 15.4*  NEUTROABS 9.7*  --   --  12.5*  HGB 14.1 14.3 13.7 13.3  HCT 41.3 42.9 42.1 39.8  MCV 90.6 92.3 92.5 89.2  PLT 289 251 279 573   Basic Metabolic Panel: Recent Labs  Lab 08/25/18 1457 08/26/18 0201 08/26/18 1524  NA 137 138  --   K 3.7 3.7  --   CL 104 104  --   CO2 20* 21*  --   GLUCOSE 156* 150*  --   BUN 14 11  --   CREATININE 0.85 0.81 0.86  CALCIUM 9.4 9.3  --    GFR: Estimated Creatinine Clearance: 95.9 mL/min (by C-G formula based on SCr of 0.86 mg/dL). Liver Function Tests: Recent Labs  Lab 08/25/18 1457  AST 20  ALT 30  ALKPHOS 49  BILITOT 0.4  PROT 8.0  ALBUMIN 4.6   Recent Labs  Lab 08/25/18 1457  LIPASE 29   No results for input(s): AMMONIA in the last 168 hours. Coagulation Profile: Recent Labs  Lab 08/25/18 2040  INR 1.0   Cardiac Enzymes: Recent Labs  Lab 08/25/18 1457 08/25/18 1830 08/26/18 0201  TROPONINI <0.03 <0.03 <0.03   BNP (last 3 results) No results for input(s): PROBNP in the last 8760 hours. HbA1C: No results for input(s): HGBA1C in the last 72 hours. CBG: Recent Labs  Lab 08/26/18 0746 08/26/18 1128 08/26/18 1637 08/26/18 2226 08/27/18 0805  GLUCAP 158* 129* 137* 150* 165*   Lipid Profile: No results for input(s): CHOL, HDL, LDLCALC, TRIG, CHOLHDL, LDLDIRECT in the last 72 hours. Thyroid Function Tests: No results for input(s): TSH, T4TOTAL, FREET4, T3FREE, THYROIDAB in the last 72 hours.  Anemia Panel: No results for input(s): VITAMINB12, FOLATE, FERRITIN, TIBC, IRON, RETICCTPCT in the last 72 hours. Sepsis Labs: Recent Labs  Lab 08/26/18 0201 08/27/18 0532  PROCALCITON 17.85 11.43    Recent Results (from the past 240 hour(s))  SARS Coronavirus 2 [facility] order, Performed in [facility] hospital lab)     Status: None   Collection Time: 08/25/18  3:32 PM  Result Value Ref Range Status   SARS Coronavirus 2 NEGATIVE NEGATIVE Final    Comment: (NOTE) If result is NEGATIVE SARS-CoV-2 target nucleic acids are NOT DETECTED. The SARS-CoV-2 RNA is generally detectable in upper and lower  respiratory specimens during the acute phase of infection. The lowest  concentration of SARS-CoV-2 viral copies this assay can detect is 250  copies / mL. A negative result does not preclude SARS-CoV-2 infection  and should not be used as the sole basis for treatment or other  patient management decisions.  A negative result may occur with  improper specimen collection / handling, submission of specimen  other  than nasopharyngeal swab, presence of viral mutation(s) within the  areas targeted by this assay, and inadequate number of viral copies  (<250 copies / mL). A negative result must be combined with clinical  observations, patient history, and epidemiological information. If result is POSITIVE SARS-CoV-2 target nucleic acids are DETECTED. The SARS-CoV-2 RNA is generally detectable in upper and lower  respiratory specimens dur ing the acute phase of infection.  Positive  results are indicative of active infection with SARS-CoV-2.  Clinical  correlation with patient history and other diagnostic information is  necessary to determine patient infection status.  Positive results do  not rule out bacterial infection or co-infection with other viruses. If result is PRESUMPTIVE POSTIVE SARS-CoV-2 nucleic acids MAY BE PRESENT.   A presumptive positive result was obtained on the submitted  specimen  and confirmed on repeat testing.  While 2019 novel coronavirus  (SARS-CoV-2) nucleic acids may be present in the submitted sample  additional confirmatory testing may be necessary for epidemiological  and / or clinical management purposes  to differentiate between  SARS-CoV-2 and other Sarbecovirus currently known to infect humans.  If clinically indicated additional testing with an alternate test  methodology [phone]) is advised. The SARS-CoV-2 RNA is generally  detectable in upper and lower respiratory sp ecimens during the acute  phase of infection. The expected result is Negative. Fact Sheet for Patients:  StrictlyIdeas.no Fact Sheet for Healthcare Providers: BankingDealers.co.za This test is not yet approved or cleared by the [place] FDA and has been authorized for detection and/or diagnosis of SARS-CoV-2 by FDA under an Emergency Use Authorization (EUA).  This EUA will remain in effect (meaning this test can be used) for the duration of the COVID-19 declaration under Section 564(b)(1) of the Act, 21 U.S.C. section 360bbb-3(b)(1), unless the authorization is terminated or revoked sooner. Performed at [facility], [place] [address] [place], [place] [address]   Respiratory Panel by PCR     Status: None   Collection Time: 08/26/18  8:08 AM  Result Value Ref Range Status   Adenovirus NOT DETECTED NOT DETECTED Final   Coronavirus 229E NOT DETECTED NOT DETECTED Final    Comment: (NOTE) The Coronavirus on the Respiratory Panel, DOES NOT test for the novel  Coronavirus (2019 nCoV)    Coronavirus HKU1 NOT DETECTED NOT DETECTED Final   Coronavirus NL63 NOT DETECTED NOT DETECTED Final   Coronavirus OC43 NOT DETECTED NOT DETECTED Final   Metapneumovirus NOT DETECTED NOT DETECTED Final   Rhinovirus / Enterovirus NOT DETECTED NOT DETECTED Final   Influenza A NOT DETECTED NOT DETECTED Final   Influenza B  NOT DETECTED NOT DETECTED Final   Parainfluenza Virus 1 NOT DETECTED NOT DETECTED Final   Parainfluenza Virus 2 NOT DETECTED NOT DETECTED Final   Parainfluenza Virus 3 NOT DETECTED NOT DETECTED Final   Parainfluenza Virus 4 NOT DETECTED NOT DETECTED Final   Respiratory Syncytial Virus NOT DETECTED NOT DETECTED Final   Bordetella pertussis NOT DETECTED NOT DETECTED Final   Chlamydophila pneumoniae NOT DETECTED NOT DETECTED Final   Mycoplasma pneumoniae NOT DETECTED NOT DETECTED Final    Comment: Performed at [facility], [place]. [address], [place],  [address]      Radiology Studies: Ct Angio Chest Pe W Or Wo Contrast  Result Date: 08/26/2018 CLINICAL DATA:  Shortness of breath, fever and cough beginning yesterday. Upper abdominal pain. EXAM: CT ANGIOGRAPHY CHEST WITH CONTRAST TECHNIQUE: Multidetector CT imaging of the chest was performed  using the standard protocol during bolus administration of intravenous contrast. Multiplanar CT image reconstructions and MIPs were obtained to evaluate the vascular anatomy. CONTRAST:  1102mL OMNIPAQUE IOHEXOL 350 MG/ML SOLN COMPARISON:  Chest radiography same day FINDINGS: Cardiovascular: Pulmonary arterial opacification is good. There are no pulmonary emboli. Heart size is normal. Patient does not have advanced coronary artery calcification. No pericardial effusion. The aorta shows mild atherosclerosis but no aneurysm or dissection. Mediastinum/Nodes: No mediastinal or hilar mass or lymphadenopathy. Lungs/Pleura: No infiltrate, collapse or effusion. Possible bronchitis pattern. No evidence of bronchiectasis. No emphysema. Upper Abdomen: Negative Musculoskeletal: Ordinary thoracic degenerative changes. Previous ACDF C7-T1. Review of the MIP images confirms the above findings. IMPRESSION: No pulmonary emboli. No pulmonary infiltrate, collapse or effusion.  Possible bronchitis. Electronically Signed   By: Nelson Chimes M.D.   On: 08/26/2018 14:41   Dg Chest  Portable 1 View  Result Date: 08/25/2018 CLINICAL DATA:  Epigastric pain, shortness of breath EXAM: PORTABLE CHEST 1 VIEW COMPARISON:  12/26/2012 FINDINGS: The heart size and mediastinal contours are within normal limits. Both lungs are clear. The visualized skeletal structures are unremarkable. IMPRESSION: No active disease. Electronically Signed   By: Kathreen Devoid   On: 08/25/2018 15:45      LOS: 0 days   Time spent: More than 50% of that time was spent in counseling and/or coordination of care.  Antonieta Pert, MD [facility]  08/27/2018, 12:51 PM

## 2018-08-27 NOTE — Progress Notes (Signed)
   Lawerance Bach Dorwart presented for a nuclear stress test today.  No immediate complications.  Stress imaging is pending at this time.  Preliminary EKG findings may be listed in the chart, but the stress test result will not be finalized until perfusion imaging is complete.  Tami Lin Duke, PA-C 08/27/2018, 10:55 AM

## 2018-08-27 NOTE — Progress Notes (Signed)
Lexiscan complete. Patient denies any complaints.  Patient taken back to [facility] to complete. Testing.

## 2018-08-28 LAB — GLUCOSE, CAPILLARY
Glucose-Capillary: 205 mg/dL — ABNORMAL HIGH (ref 70–99)
Glucose-Capillary: 189 mg/dL — ABNORMAL HIGH (ref 70–99)
Glucose-Capillary: 106 mg/dL — ABNORMAL HIGH (ref 70–99)
Glucose-Capillary: 152 mg/dL — ABNORMAL HIGH (ref 70–99)

## 2018-08-28 LAB — NM MYOCAR MULTI W/SPECT W/WALL MOTION / EF
MPHR: 152 {beats}/min
Peak HR: 88 {beats}/min
Percent HR: 57 %
Rest HR: 71 {beats}/min

## 2018-08-28 LAB — CBC
HCT: 37.8 % — ABNORMAL LOW (ref 39.0–52.0)
Hemoglobin: 12.3 g/dL — ABNORMAL LOW (ref 13.0–17.0)
MCH: 30 pg (ref 26.0–34.0)
MCHC: 32.5 g/dL (ref 30.0–36.0)
MCV: 92.2 fL (ref 80.0–100.0)
Platelets: 225 10*3/uL (ref 150–400)
RBC: 4.1 MIL/uL — ABNORMAL LOW (ref 4.22–5.81)
RDW: 14.7 % (ref 11.5–15.5)
WBC: 7.7 10*3/uL (ref 4.0–10.5)
nRBC: 0 % (ref 0.0–0.2)

## 2018-08-28 LAB — PROCALCITONIN: Procalcitonin: 5.74 ng/mL

## 2018-08-28 MED ORDER — ZOLPIDEM TARTRATE 5 MG PO TABS
5.0000 mg | ORAL_TABLET | Freq: Once | ORAL | Status: AC
  Administered 2018-08-28: 5 mg via ORAL
  Filled 2018-08-28: qty 1

## 2018-08-28 NOTE — Progress Notes (Signed)
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  Patient is EKG showed new T wave inversion in anterior leads and lateral leads.  Although patient's troponin was negative but he was started on heparin drip.  Subsequent troponins have been negative x3 so far.  Patient had low-grade fever.  Chest x-ray was negative he was tested negative for COVID-19 as well as influenza and respiratory viral panel.  His d-dimer was elevated for his age so CT angiogram of the chest was done which was unremarkable as well. Issues being addressed:  68 y.o.malewith medical history significant fortype 2 diabetes, hypertension, hyperlipidemia, GERD, prostate cancer, OSA not requiring CPAP, depression/anxiety, chronic pain, and remote history of substance use (cocaine in 1980s) who presented to the [facility]/2020with new onset dyspnea on exertion and abdominal/chest painand some tingling and pain in the both arms, more in the left than the right. Patient is EKG showed new T wave inversion in anterior leads and lateral leads. Although patient's troponin was negative but he was started on heparin drip. Subsequent troponins have been negative x3 so far. Patient had low-grade fever. Chest x-ray was negative he was tested negative for COVID-19 as well as influenza and respiratory viral panel. His d-dimer was elevated for his age so CT angiogram of the chest was done which was unremarkable as well.   nuclear stress test was done  for chest pain.  Other issues addressed are as below.     Subjective: Resting comfortably.  No nausea vomiting chest pain fever or chills. eager to go home.  Assessment & Plan:   Chest pain with serial negative troponin, CTPA negative for PE. Dr Doristine Bosworth discussed with Dr. Meda Coffee from cardiology and proceeded with stress test 5/6. nuclear test came back abnormal w ischemia in apex. He is on asa 81, lipitor. Requesting cardio consult, I spoke w cardiology master regarding consult. Keep NPO past MN in case cardio wants to perform any more procedure/testing.  Low-grade fever with leukocytosis currently no obvious infection noted with unremarkable urinalysis, influenza and respiratory virus panel and COVID 19 negative. procal was up at 11.4 but donwtrending-unclear cause, obtained blood culture and is negative so far.  Patient has been afebrile and leukocytosis resolved.  Advised to follow-up with PCP or return to ER if he has fever or any other signs of infection.   RUQ pain, resolved.  Ongoing for 2 months, additional work-up with MRI of the abdomen and ultrasound unrevealing.  Currently electrically stable. T2 DM stable, a1c 9.1  In April. On OHA. continue home regimen HTN: Controlled on losartan. HLD- cont Lipitor. Chronic pain/neuropathy on home pain regimen and Neurontin.  DVT prophylaxis: lovenox Code Status: full Family Communication: Plan of care discussed with patient extensively Disposition Plan: awaiting cardio eval for abnormal nuclear stress test  Consultants:  Cardiology  Procedures: Stress test  Antimicrobials: Anti-infectives (From admission, onward)   None       Objective: Vitals:   08/27/18 1051 08/27/18 1258 08/27/18 2147 08/28/18 0607  BP: 115/66 133/81 133/73 127/67  Pulse:  78 74 63  Resp:  (!) 25 16 20   Temp:  98.9 F (37.2 C) 98.9 F (37.2 C) 98.6 F (37 C)  TempSrc:  Oral Oral Oral  SpO2:  97% 98% 97%  Weight:        Intake/Output Summary  (Last 24 hours) at 08/28/2018 1410 Last data filed at 08/28/2018 9629 Gross per 24 hour  Intake 240 ml  Output --  Net 240 ml   Filed Weights   08/26/18 1500  Weight: 103.7 kg   Weight change:   Body mass index is 34.76 kg/m.  Intake/Output from previous day: No intake/output data recorded. Intake/Output this shift: Total I/O In: 240 [P.O.:240] Out: -   Examination:  General exam: Calm, comfortable, not in acute distress, older for age, average built.  HEENT:Oral mucosa moist, Ear/Nose WNL grossly, dentition normal. Respiratory system: Bilateral equal air entry, no crackles and wheezing, no use of accessory muscle, non tender on palpation. Cardiovascular system: regular rate and rhythm, S1 & S2 heard, No JVD/murmurs. Gastrointestinal system: Abdomen soft, non-tender, non-distended, BS +.  Nervous System:Alert, awake and oriented at baseline. Able to move UE and LE, sensation intact. Extremities: No edema, distal peripheral pulses palpable.  Skin: No rashes,no icterus. MSK: Normal muscle bulk,tone, power  Medications:  Scheduled Meds:  aspirin  81 mg Oral Daily   atorvastatin  10 mg Oral Daily   enoxaparin (LOVENOX) injection  40 mg Subcutaneous Q24H   gabapentin  100 mg Oral BID   insulin aspart  0-9 Units Subcutaneous TID WC   losartan  50 mg Oral Daily   Continuous Infusions:  Data Reviewed: I have personally reviewed following labs and imaging studies  CBC: Recent Labs  Lab 08/25/18 1457 08/26/18 0201 08/26/18 1524 08/27/18 0532 08/28/18 0529  WBC 10.6* 18.1* 18.6* 15.4* 7.7  NEUTROABS 9.7*  --   --  12.5*  --   HGB 14.1 14.3 13.7 13.3 12.3*  HCT 41.3 42.9 42.1 39.8 37.8*  MCV 90.6 92.3 92.5 89.2 92.2  PLT 289 251 279 237 528   Basic Metabolic Panel: Recent Labs  Lab 08/25/18 1457 08/26/18 0201 08/26/18 1524  NA 137 138  --   K 3.7 3.7  --   CL 104 104  --   CO2 20* 21*  --   GLUCOSE 156* 150*  --   BUN 14 11  --   CREATININE 0.85 0.81  0.86  CALCIUM 9.4 9.3  --    GFR: Estimated Creatinine Clearance: 95.9 mL/min (by C-G formula based on SCr of 0.86 mg/dL). Liver Function Tests: Recent Labs  Lab 08/25/18 1457  AST 20  ALT 30  ALKPHOS 49  BILITOT 0.4  PROT 8.0  ALBUMIN 4.6   Recent Labs  Lab 08/25/18 1457  LIPASE 29   No results for input(s): AMMONIA in the last 168 hours. Coagulation Profile: Recent Labs  Lab 08/25/18 2040  INR 1.0   Cardiac Enzymes: Recent Labs  Lab 08/25/18 1457 08/25/18 1830 08/26/18 0201  TROPONINI <0.03 <0.03 <0.03   BNP (last 3 results) No results for input(s): PROBNP in the last 8760 hours. HbA1C: No results for input(s): HGBA1C in the last 72 hours. CBG: Recent Labs  Lab 08/27/18 1249 08/27/18 1713 08/27/18 2148 08/28/18 0731 08/28/18 1137  GLUCAP 177* 158* 167* 205* 189*   Lipid Profile: No results for input(s): CHOL, HDL, LDLCALC, TRIG, CHOLHDL, LDLDIRECT in the last 72 hours. Thyroid Function Tests: No results for input(s): TSH, T4TOTAL, FREET4,  T3FREE, THYROIDAB in the last 72 hours. Anemia Panel: No results for input(s): VITAMINB12, FOLATE, FERRITIN, TIBC, IRON, RETICCTPCT in the last 72 hours. Sepsis Labs: Recent Labs  Lab 08/26/18 0201 08/27/18 0532 08/28/18 0529  PROCALCITON 17.85 11.43 5.74    Recent Results (from the past 240 hour(s))  SARS Coronavirus 2 [facility] order, Performed in [facility] hospital lab)     Status: None   Collection Time: 08/25/18  3:32 PM  Result Value Ref Range Status   SARS Coronavirus 2 NEGATIVE NEGATIVE Final    Comment: (NOTE) If result is NEGATIVE SARS-CoV-2 target nucleic acids are NOT DETECTED. The SARS-CoV-2 RNA is generally detectable in upper and lower  respiratory specimens during the acute phase of infection. The lowest  concentration of SARS-CoV-2 viral copies this assay can detect is 250  copies / mL. A negative result does not preclude SARS-CoV-2 infection  and should not be used as the sole basis  for treatment or other  patient management decisions.  A negative result may occur with  improper specimen collection / handling, submission of specimen other  than nasopharyngeal swab, presence of viral mutation(s) within the  areas targeted by this assay, and inadequate number of viral copies  (<250 copies / mL). A negative result must be combined with clinical  observations, patient history, and epidemiological information. If result is POSITIVE SARS-CoV-2 target nucleic acids are DETECTED. The SARS-CoV-2 RNA is generally detectable in upper and lower  respiratory specimens dur ing the acute phase of infection.  Positive  results are indicative of active infection with SARS-CoV-2.  Clinical  correlation with patient history and other diagnostic information is  necessary to determine patient infection status.  Positive results do  not rule out bacterial infection or co-infection with other viruses. If result is PRESUMPTIVE POSTIVE SARS-CoV-2 nucleic acids MAY BE PRESENT.   A presumptive positive result was obtained on the submitted specimen  and confirmed on repeat testing.  While 2019 novel coronavirus  (SARS-CoV-2) nucleic acids may be present in the submitted sample  additional confirmatory testing may be necessary for epidemiological  and / or clinical management purposes  to differentiate between  SARS-CoV-2 and other Sarbecovirus currently known to infect humans.  If clinically indicated additional testing with an alternate test  methodology [phone]) is advised. The SARS-CoV-2 RNA is generally  detectable in upper and lower respiratory sp ecimens during the acute  phase of infection. The expected result is Negative. Fact Sheet for Patients:  StrictlyIdeas.no Fact Sheet for Healthcare Providers: BankingDealers.co.za This test is not yet approved or cleared by the [place] FDA and has been authorized for detection and/or  diagnosis of SARS-CoV-2 by FDA under an Emergency Use Authorization (EUA).  This EUA will remain in effect (meaning this test can be used) for the duration of the COVID-19 declaration under Section 564(b)(1) of the Act, 21 U.S.C. section 360bbb-3(b)(1), unless the authorization is terminated or revoked sooner. Performed at [facility], [place] [address], [place], [place] [address]   Respiratory Panel by PCR     Status: None   Collection Time: 08/26/18  8:08 AM  Result Value Ref Range Status   Adenovirus NOT DETECTED NOT DETECTED Final   Coronavirus 229E NOT DETECTED NOT DETECTED Final    Comment: (NOTE) The Coronavirus on the Respiratory Panel, DOES NOT test for the novel  Coronavirus (2019 nCoV)    Coronavirus HKU1 NOT DETECTED NOT DETECTED Final   Coronavirus NL63 NOT DETECTED NOT DETECTED Final  Coronavirus OC43 NOT DETECTED NOT DETECTED Final   Metapneumovirus NOT DETECTED NOT DETECTED Final   Rhinovirus / Enterovirus NOT DETECTED NOT DETECTED Final   Influenza A NOT DETECTED NOT DETECTED Final   Influenza B NOT DETECTED NOT DETECTED Final   Parainfluenza Virus 1 NOT DETECTED NOT DETECTED Final   Parainfluenza Virus 2 NOT DETECTED NOT DETECTED Final   Parainfluenza Virus 3 NOT DETECTED NOT DETECTED Final   Parainfluenza Virus 4 NOT DETECTED NOT DETECTED Final   Respiratory Syncytial Virus NOT DETECTED NOT DETECTED Final   Bordetella pertussis NOT DETECTED NOT DETECTED Final   Chlamydophila pneumoniae NOT DETECTED NOT DETECTED Final   Mycoplasma pneumoniae NOT DETECTED NOT DETECTED Final    Comment: Performed at [facility], [place] [address], [place], [place] [address]  Culture, blood (routine x 2)     Status: None (Preliminary result)   Collection Time: 08/27/18  1:22 PM  Result Value Ref Range Status   Specimen Description   Final    BLOOD LEFT ANTECUBITAL Performed at [facility] [address] [place], [place] [address]     Special Requests   Final    BOTTLES DRAWN AEROBIC AND ANAEROBIC Blood Culture adequate volume Performed at [facility] [address] [place], [place] [address]    Culture   Final    NO GROWTH < 24 HOURS Performed at [facility] [address], [place], [place] [address]    Report Status PENDING  Incomplete  Culture, blood (routine x 2)     Status: None (Preliminary result)   Collection Time: 08/27/18  1:27 PM  Result Value Ref Range Status   Specimen Description   Final    BLOOD RIGHT ANTECUBITAL Performed at [facility] [address], [place], [place] [address]    Special Requests   Final    BOTTLES DRAWN AEROBIC AND ANAEROBIC Blood Culture adequate volume Performed at [facility] [address] [place], [place] [address]    Culture   Final    NO GROWTH < 24 HOURS Performed at [place] [address] [place],  [address]    Report Status PENDING  Incomplete      Radiology Studies: Ct Angio Chest Pe W Or Wo Contrast  Result Date: 08/26/2018 CLINICAL DATA:  Shortness of breath, fever and cough beginning yesterday. Upper abdominal pain. EXAM: CT ANGIOGRAPHY CHEST WITH CONTRAST TECHNIQUE: Multidetector CT imaging of the chest was performed using the standard protocol during bolus administration of intravenous contrast. Multiplanar CT image reconstructions and MIPs were obtained to evaluate the vascular anatomy. CONTRAST:  127mL OMNIPAQUE IOHEXOL 350 MG/ML SOLN COMPARISON:  Chest radiography same day FINDINGS: Cardiovascular: Pulmonary arterial opacification is good. There are no pulmonary emboli. Heart size is normal. Patient does not have advanced coronary artery calcification. No pericardial effusion. The aorta shows mild atherosclerosis but no aneurysm or dissection. Mediastinum/Nodes: No mediastinal or hilar mass or lymphadenopathy. Lungs/Pleura: No infiltrate, collapse or effusion. Possible  bronchitis pattern. No evidence of bronchiectasis. No emphysema. Upper Abdomen: Negative Musculoskeletal: Ordinary thoracic degenerative changes. Previous ACDF C7-T1. Review of the MIP images confirms the above findings. IMPRESSION: No pulmonary emboli. No pulmonary infiltrate, collapse or effusion.  Possible bronchitis. Electronically Signed   By: Nelson Chimes M.D.   On: 08/26/2018 14:41   Nm Myocar Multi W/spect W/wall Motion / Ef  Result Date: 08/28/2018  There was no ST segment deviation noted during  stress.  T wave inversion was noted during stress in the II, III and aVF leads.  Defect 1: There is a medium defect of moderate severity present in the apical septal, apical lateral and apex location.  This is an intermediate risk study.  Nuclear stress EF: 61%. The left ventricular ejection fraction is normal (55-65%).  Findings consistent with ischemia of the apex       LOS: 1 day   Time spent: More than 50% of that time was spent in counseling and/or coordination of care.  Antonieta Pert, MD [facility]  08/28/2018, 2:10 PM

## 2018-08-29 ENCOUNTER — Ambulatory Visit: Payer: Medicare Other | Admitting: Family Medicine

## 2018-08-29 ENCOUNTER — Encounter: Payer: Self-pay | Admitting: Family Medicine

## 2018-08-29 ENCOUNTER — Encounter (HOSPITAL_COMMUNITY)
Admission: EM | Disposition: A | Discharge status: Home / Self Care | Payer: Self-pay | Source: Home / Self Care | Attending: Internal Medicine

## 2018-08-29 VITALS — Ht 68.0 in

## 2018-08-29 DIAGNOSIS — R079 Chest pain, unspecified: Secondary | ICD-10-CM

## 2018-08-29 DIAGNOSIS — E7800 Pure hypercholesterolemia, unspecified: Secondary | ICD-10-CM

## 2018-08-29 DIAGNOSIS — I10 Essential (primary) hypertension: Secondary | ICD-10-CM

## 2018-08-29 DIAGNOSIS — R9439 Abnormal result of other cardiovascular function study: Secondary | ICD-10-CM

## 2018-08-29 HISTORY — PX: LEFT HEART CATH AND CORONARY ANGIOGRAPHY: CATH118249

## 2018-08-29 LAB — BASIC METABOLIC PANEL
Anion gap: 11 (ref 5–15)
BUN: 10 mg/dL (ref 8–23)
CO2: 25 mmol/L (ref 22–32)
Calcium: 8.5 mg/dL — ABNORMAL LOW (ref 8.9–10.3)
Chloride: 100 mmol/L (ref 98–111)
Creatinine, Ser: 0.94 mg/dL (ref 0.61–1.24)
GFR calc Af Amer: >60 mL/min (ref >60)
GFR calc non Af Amer: >60 mL/min (ref >60)
Glucose, Bld: 172 mg/dL — ABNORMAL HIGH (ref 70–99)
Potassium: 3.6 mmol/L (ref 3.5–5.1)
Sodium: 136 mmol/L (ref 135–145)

## 2018-08-29 LAB — CBC
HCT: 38.9 % — ABNORMAL LOW (ref 39.0–52.0)
Hemoglobin: 12.6 g/dL — ABNORMAL LOW (ref 13.0–17.0)
MCH: 29.7 pg (ref 26.0–34.0)
MCHC: 32.4 g/dL (ref 30.0–36.0)
MCV: 91.7 fL (ref 80.0–100.0)
Platelets: 236 10*3/uL (ref 150–400)
RBC: 4.24 MIL/uL (ref 4.22–5.81)
RDW: 14.3 % (ref 11.5–15.5)
WBC: 6.8 10*3/uL (ref 4.0–10.5)
nRBC: 0 % (ref 0.0–0.2)

## 2018-08-29 LAB — GLUCOSE, CAPILLARY
Glucose-Capillary: 167 mg/dL — ABNORMAL HIGH (ref 70–99)
Glucose-Capillary: 175 mg/dL — ABNORMAL HIGH (ref 70–99)
Glucose-Capillary: 140 mg/dL — ABNORMAL HIGH (ref 70–99)

## 2018-08-29 SURGERY — LEFT HEART CATH AND CORONARY ANGIOGRAPHY
Anesthesia: LOCAL

## 2018-08-29 MED ORDER — SODIUM CHLORIDE 0.9% FLUSH: 3.0000 mL | INTRAVENOUS | Status: DC | PRN

## 2018-08-29 MED ORDER — SODIUM CHLORIDE 0.9 % IV SOLN: 250.0000 mL | INTRAVENOUS | Status: DC | PRN

## 2018-08-29 MED ORDER — MIDAZOLAM HCL 2 MG/2ML IJ SOLN
INTRAMUSCULAR | Status: AC
  Filled 2018-08-29: qty 2

## 2018-08-29 MED ORDER — CARVEDILOL 3.125 MG PO TABS: 3.1250 mg | ORAL_TABLET | Freq: Two times a day (BID) | ORAL | 0 refills | Status: DC

## 2018-08-29 MED ORDER — LIDOCAINE HCL (PF) 1 % IJ SOLN
INTRAMUSCULAR | Status: DC | PRN
  Administered 2018-08-29: 2 mL

## 2018-08-29 MED ORDER — METFORMIN HCL 1000 MG PO TABS: 1000.0000 mg | ORAL_TABLET | Freq: Two times a day (BID) | ORAL | Status: DC

## 2018-08-29 MED ORDER — ASPIRIN 81 MG PO CHEW: 81.0000 mg | CHEWABLE_TABLET | Freq: Every day | ORAL | Status: DC

## 2018-08-29 MED ORDER — HEPARIN SODIUM (PORCINE) 1000 UNIT/ML IJ SOLN
INTRAMUSCULAR | Status: DC | PRN
  Administered 2018-08-29: 4000 [IU] via INTRAVENOUS

## 2018-08-29 MED ORDER — MIDAZOLAM HCL 2 MG/2ML IJ SOLN
INTRAMUSCULAR | Status: DC | PRN
  Administered 2018-08-29: 2 mg via INTRAVENOUS
  Administered 2018-08-29: 1 mg via INTRAVENOUS

## 2018-08-29 MED ORDER — HYDRALAZINE HCL 20 MG/ML IJ SOLN: 10.0000 mg | INTRAMUSCULAR | Status: DC | PRN

## 2018-08-29 MED ORDER — ASPIRIN 81 MG PO CHEW: 81.0000 mg | CHEWABLE_TABLET | ORAL | Status: DC

## 2018-08-29 MED ORDER — ATORVASTATIN CALCIUM 40 MG PO TABS: 80.0000 mg | ORAL_TABLET | Freq: Every day | ORAL | Status: DC

## 2018-08-29 MED ORDER — ATORVASTATIN CALCIUM 80 MG PO TABS: 80.0000 mg | ORAL_TABLET | Freq: Every day | ORAL | 0 refills | Status: DC

## 2018-08-29 MED ORDER — SODIUM CHLORIDE 0.9 % WEIGHT BASED INFUSION: 1.0000 mL/kg/h | INTRAVENOUS | Status: DC

## 2018-08-29 MED ORDER — SODIUM CHLORIDE 0.9 % IV SOLN: INTRAVENOUS | Status: DC

## 2018-08-29 MED ORDER — VERAPAMIL HCL 2.5 MG/ML IV SOLN
INTRAVENOUS | Status: AC
  Filled 2018-08-29: qty 2

## 2018-08-29 MED ORDER — HEPARIN (PORCINE) IN NACL 1000-0.9 UT/500ML-% IV SOLN
INTRAVENOUS | Status: AC
  Filled 2018-08-29: qty 1000

## 2018-08-29 MED ORDER — HEPARIN SODIUM (PORCINE) 1000 UNIT/ML IJ SOLN
INTRAMUSCULAR | Status: AC
  Filled 2018-08-29: qty 1

## 2018-08-29 MED ORDER — VERAPAMIL HCL 2.5 MG/ML IV SOLN
INTRAVENOUS | Status: DC | PRN
  Administered 2018-08-29: 10 mL via INTRA_ARTERIAL

## 2018-08-29 MED ORDER — LABETALOL HCL 5 MG/ML IV SOLN: 10.0000 mg | INTRAVENOUS | Status: DC | PRN

## 2018-08-29 MED ORDER — HEPARIN (PORCINE) IN NACL 1000-0.9 UT/500ML-% IV SOLN
INTRAVENOUS | Status: DC | PRN
  Administered 2018-08-29 (×2): 500 mL

## 2018-08-29 MED ORDER — LIDOCAINE HCL (PF) 1 % IJ SOLN
INTRAMUSCULAR | Status: AC
  Filled 2018-08-29: qty 30

## 2018-08-29 MED ORDER — ONDANSETRON HCL 4 MG/2ML IJ SOLN: 4.0000 mg | Freq: Four times a day (QID) | INTRAMUSCULAR | Status: DC | PRN

## 2018-08-29 MED ORDER — FENTANYL CITRATE (PF) 100 MCG/2ML IJ SOLN
INTRAMUSCULAR | Status: AC
  Filled 2018-08-29: qty 2

## 2018-08-29 MED ORDER — SODIUM CHLORIDE 0.9% FLUSH
3.0000 mL | Freq: Two times a day (BID) | INTRAVENOUS | Status: DC
  Administered 2018-08-29: 3 mL via INTRAVENOUS

## 2018-08-29 MED ORDER — SODIUM CHLORIDE 0.9% FLUSH: 3.0000 mL | Freq: Two times a day (BID) | INTRAVENOUS | Status: DC

## 2018-08-29 MED ORDER — IOHEXOL 350 MG/ML SOLN
INTRAVENOUS | Status: DC | PRN
  Administered 2018-08-29: 17:00:00 120 mL via INTRA_ARTERIAL

## 2018-08-29 MED ORDER — SODIUM CHLORIDE 0.9 % WEIGHT BASED INFUSION
3.0000 mL/kg/h | INTRAVENOUS | Status: AC
  Administered 2018-08-29: 13:00:00 3 mL/kg/h via INTRAVENOUS

## 2018-08-29 MED ORDER — FENTANYL CITRATE (PF) 100 MCG/2ML IJ SOLN
INTRAMUSCULAR | Status: DC | PRN
  Administered 2018-08-29 (×2): 25 ug via INTRAVENOUS

## 2018-08-29 MED ORDER — ACETAMINOPHEN 325 MG PO TABS: 650.0000 mg | ORAL_TABLET | ORAL | Status: DC | PRN

## 2018-08-29 MED ORDER — CARVEDILOL 3.125 MG PO TABS
3.1250 mg | ORAL_TABLET | Freq: Two times a day (BID) | ORAL | Status: DC
  Administered 2018-08-29: 3.125 mg via ORAL
  Filled 2018-08-29: qty 1

## 2018-08-29 SURGICAL SUPPLY — 13 items
CATH 5FR JL3.5 JR4 ANG PIG MP (CATHETERS) ×2 IMPLANT
CATH INFINITI 5 FR 3DRC (CATHETERS) ×2 IMPLANT
CATH INFINITI 5FR AL1 (CATHETERS) ×2 IMPLANT
CATH LAUNCHER 5F AL1 (CATHETERS) ×1 IMPLANT
CATHETER LAUNCHER 5F AL1 (CATHETERS) ×2
DEVICE RAD COMP TR BAND LRG (VASCULAR PRODUCTS) ×2 IMPLANT
GLIDESHEATH SLEND SS 6F .021 (SHEATH) ×2 IMPLANT
GUIDEWIRE INQWIRE 1.5J.035X260 (WIRE) ×1 IMPLANT
INQWIRE 1.5J .035X260CM (WIRE) ×2
KIT HEART LEFT (KITS) ×2 IMPLANT
PACK CARDIAC CATHETERIZATION (CUSTOM PROCEDURE TRAY) ×2 IMPLANT
TRANSDUCER W/STOPCOCK (MISCELLANEOUS) ×2 IMPLANT
TUBING CIL FLEX 10 FLL-RA (TUBING) ×2 IMPLANT

## 2018-08-29 NOTE — Consult Note (Signed)
Cardiology Consultation:   Patient ID: [name]; [id number]; December 23, 1950   Admit date: 08/25/2018 Date of Consult: 08/29/2018  Primary Care Provider: Ria Bush, MD Primary Cardiologist: New- Dr. Jenkins Rouge remotely (2013)  Patient Profile:   [name] is a 68 y.o. male with a hx of DM2, hypertension, hyperlipidemia, GERD, prostate cancer, OSA not requiring CPAP, depression/anxiety, chronic pain and remote history of substance abuse who is being seen today after an abnormal stress test at the request of Dr. Lupita Leash.  History of Present Illness:   [name] is a 68 year old male who presented to [facility] on 08/25/2018 with chest pain. Per chart review, patient reported that he was in his usual state of health when he went to the grocery store to pick up his medications.  After returning home, while walking from his car to his house he felt more short of breath than expected. He reported that he sat down and began having right upper quadrant abdominal pain as well as central chest pain with radiation to bilateral arms with associated numbness and tingling. He reported that his symptoms lasted approximately 30 minutes in duration and improved after resting. Given his symptoms, he reported to the emergency department for further evaluation.  Of note, the patient has a prior history of right upper quadrant pain that is been intermittent for the last 2 months in which he underwent an abdominal ultrasound 07/31/2018 which showed changes consistent with fatty liver and was negative for gallstones. There was a small hypo-echoic lesion at the head of the pancreas and therefore he underwent an MRI of the abdomen 08/07/2018 that was negative for pancreatic mass or other significant abnormalities.  [name] was last seen by our service on 06/13/2011 for follow-up of chest pain. Per chart review, he underwent a cardiac catheterization 12/31/2007 per Dr. 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 During his last office visit, he noted having chronic recurrent atypical chest pain symptoms thought likely to be related to anxiety and cervical spine issues given their description. Risk modifications strategies with diet and exercise were discussed during this visit with plans to continue beta-blocker and ASA therapies and close follow-up thereafter given his prior normal cath. He has not been seen in our office since this time.  In the ED, troponin levels were found to be negative x3 at <0.03, <0.03,<0.03.  EKG showed a wandering baseline, NSR with no acute ischemic changes with TWI in anterior leads.  CXR with no acute process.  ASA 324 mg, SL NTG, morphine 4 mg IV x1 and was started on heparin GTT per pharmacy. He was admitted to the hospitalist service.  An echocardiogram was obtained 08/26/2018 which showed an LVEF of 55 to 81% with diastolic dysfunction. The patient continued to have chest pain during his hospital course and therefore a Lexiscan Myoview stress test was obtained 08/27/2018 which showed a medium defect of moderate severity present in the apical septal, apical lateral and apex location considered an intermediate risk study.  Findings were thought to be consistent with ischemia of the apex.  Given the above results, cardiology has been asked to evaluate for further recommendations.  Past Medical History:  Diagnosis Date  . Abdominal wall hernia   . Anxiety   . Back pain    with radiculopathy  . Bladder wall thickening 2013   nl rpt CT scan, normal cytology and UCx, released from urology Karsten Ro)  . Chest pain, non-cardiac 2008  adenosine myoview low risk.  EF 60%.  normal cath 2008, deemed atypical /noncardiac, ?discogenic  . Chronic neck pain   . Chronic pain    narcotic dependent ([place])  . Circadian rhythm sleep disorder, irregular sleep-wake type   . Complication of anesthesia    hard to put patient to sleep  . Depression   .  Diverticulosis 2013   by CT scan  . Esophageal stricture 06/2007   dilation of stricture  . GERD (gastroesophageal reflux disease)   . Gross hematuria 2013, 2016   negative CT, cystoscopy, cytology, 2016 - thought from E coli UTI s/p appropriate treatment ([place])  . History of cocaine use 2004  . History of epididymitis   . History of prostatitis 08/2011   ?chronic/simmering, treated with 1 mo cipro  . Hyperlipidemia   . Hypertension    Essential  . OSA (obstructive sleep apnea) 2010   mild off CPAP  . Postlaminectomy syndrome of cervical region    s/p ACDF C5-7 2010 ([place])  . Prostate cancer ([place]) 06/16/2012   T2a, Gleason 6 prostate cancer found by R apical nodule with PSA 2.18 Karsten Ro, [place]) 04/2016  . T2DM (type 2 diabetes mellitus) ([place])   . TIA (transient ischemic attack)    cocaine related    Past Surgical History:  Procedure Laterality Date  . ANTERIOR CERVICAL DISCECTOMY  10/28/2008   with fusion (Dr. Rolena Infante)  . CARDIAC CATHETERIZATION  2008   WNL   . CARPAL TUNNEL RELEASE     Right  . CATARACT EXTRACTION Bilateral 12/2014, 01/2015   Dr Tommy Rainwater  . COLONOSCOPY  10/11/2011   severe diverticulosis, int hemorrhoids, rec rpt 5 yrs Carlean Purl)  . DIRECT LARYNGOSCOPY  11/02/2008   Extubation under anesthesia (Dr. Erik Obey)  . ESOPHAGOGASTRODUODENOSCOPY  07/17/07   Esoph. Stricture dilated (Dr. Henrene Pastor)  . ESOPHAGOGASTRODUODENOSCOPY  10/11/2011   WNL Carlean Purl)  . EXCISION MASS NECK N/A 08/29/2016   Procedure: EXCISION MASS POSTERIOR NECK;  Surgeon: Erroll Luna, MD;  Location: [place];  Service: General;  Laterality: N/A;  . HEMATOMA EVACUATION  10/29/2008   (Dr. Rolena Infante)  . INGUINAL HERNIA REPAIR     bilateral, 5 cm. RLQ Abd Lipoma Excision (Dr. Lucia Gaskins)  . LIPOMA EXCISION  08/2016   posterior neck  . SHOULDER ARTHROSCOPY WITH SUBACROMIAL DECOMPRESSION AND OPEN ROTATOR C Left 01/23/2013   Procedure: LEFT SHOULDER ARTHROSCOPY WITH SUBACROMIAL  DECOMPRESSION AND MINI OPEN ROTATOR CUFF REPAIR, AND OPEN DISTAL CLAVICLE RESECTION;  Surgeon: Augustin Schooling, MD;  Location: [place];  Service: Orthopedics;  Laterality: Left;  . SHOULDER SURGERY Right 2013   replaced  . TONSILLECTOMY  childhood  . TRIGGER FINGER RELEASE     Right long finger     Prior to Admission medications   Medication Sig Start Date End Date Taking? Authorizing Provider  aspirin EC 81 MG tablet Take 81 mg by mouth daily. 10/25/11  Yes Thurnell Lose, MD  atorvastatin (LIPITOR) 10 MG tablet TAKE 1 TABLET BY MOUTH  DAILY Patient taking differently: Take 10 mg by mouth daily.  05/14/18  Yes Ria Bush, MD  baclofen (LIORESAL) 10 MG tablet TAKE 1 TABLET BY MOUTH AT  BEDTIME AS NEEDED FOR BACK  SPASMS Patient taking differently: Take 10 mg by mouth at bedtime as needed for muscle spasms.  07/28/18  Yes Ria Bush, MD  empagliflozin (JARDIANCE) 10 MG TABS tablet Take 10 mg by mouth daily. 07/30/18  Yes Ria Bush, MD  fluticasone [facility]) 50 MCG/ACT  nasal spray USE 2 SPRAYS IN EACH  NOSTRIL DAILY Patient taking differently: Place 2 sprays into both nostrils daily.  03/27/17  Yes Ria Bush, MD  gabapentin (NEURONTIN) 100 MG capsule TAKE 1 CAPSULE BY MOUTH TWO TIMES DAILY Patient taking differently: Take 100 mg by mouth 2 (two) times daily.  07/28/18  Yes Ria Bush, MD  glipiZIDE (GLUCOTROL XL) 10 MG 24 hr tablet TAKE 1 TABLET BY MOUTH  DAILY WITH BREAKFAST Patient taking differently: Take 10 mg by mouth daily with breakfast.  06/03/17  Yes Ria Bush, MD  metFORMIN (GLUCOPHAGE) 1000 MG tablet TAKE 1 TABLET BY MOUTH 2  TIMES DAILY WITH MEALS Patient taking differently: Take 1,000 mg by mouth 2 (two) times daily with a meal.  07/28/18  Yes Ria Bush, MD  oxyCODONE-acetaminophen (PERCOCET) 10-325 MG tablet Take 1 tablet by mouth every 4 (four) hours as needed for pain.    Yes [provider]  chlorhexidine (PERIDEX) 0.12 %  solution Use as directed 15 mLs in the mouth or throat 2 (two) times daily. Patient not taking: Reported on 08/25/2018 08/14/18   Ria Bush, MD  glucose blood (ONE TOUCH ULTRA TEST) test strip USE WITH METER TO CHECK  SUGAR TWICE DAILY. 03/10/18   Ria Bush, MD  losartan (COZAAR) 50 MG tablet TAKE 1 TABLET BY MOUTH  DAILY Patient taking differently: Take 50 mg by mouth daily.  08/12/18   Ria Bush, MD  triamcinolone (KENALOG) 0.1 % paste Use as directed 1 application in the mouth or throat 2 (two) times daily. Patient not taking: Reported on 08/25/2018 08/08/18   Ria Bush, MD    Inpatient Medications: Scheduled Meds: . aspirin  81 mg Oral Daily  . [START ON 08/30/2018] atorvastatin  80 mg Oral Daily  . carvedilol  3.125 mg Oral BID WC  . enoxaparin (LOVENOX) injection  40 mg Subcutaneous Q24H  . gabapentin  100 mg Oral BID  . insulin aspart  0-9 Units Subcutaneous TID WC  . losartan  50 mg Oral Daily   Continuous Infusions:  PRN Meds: acetaminophen, alum & mag hydroxide-simeth, magnesium hydroxide, nitroGLYCERIN, ondansetron (ZOFRAN) IV, oxyCODONE-acetaminophen **AND** oxyCODONE  Allergies:    Allergies  Allergen Reactions  . Gabapentin     Dizziness  . Glimepiride Other (See Comments)    Headache, back pain  . Menthol (Topical Analgesic) Rash    Burning rash    Social History:   Social History   Socioeconomic History  . Marital status: Single    Spouse name: Not on file  . Number of children: Not on file  . Years of education: Not on file  . Highest education level: Not on file  Occupational History  . Occupation: [name] previously    Fish farm manager: UNEMPLOYED    Comment: Now on disability after neck surgery  Social Needs  . Financial resource strain: Not on file  . Food insecurity:    Worry: Not on file    Inability: Not on file  . Transportation needs:    Medical: Not on file    Non-medical: Not on file  Tobacco Use  . Smoking  status: Never Smoker  . Smokeless tobacco: Never Used  Substance and Sexual Activity  . Alcohol use: No    Alcohol/week: 0.0 standard drinks  . Drug use: No    Comment: H/O marijuana (last 2005); Cocaine 5/04  . Sexual activity: Never  Lifestyle  . Physical activity:    Days per week: Not on file  Minutes per session: Not on file  . Stress: Not on file  Relationships  . Social connections:    Talks on phone: Not on file    Gets together: Not on file    Attends religious service: Not on file    Active member of club or organization: Not on file    Attends meetings of clubs or organizations: Not on file    Relationship status: Not on file  . Intimate partner violence:    Fear of current or ex partner: Not on file    Emotionally abused: Not on file    Physically abused: Not on file    Forced sexual activity: Not on file  Other Topics Concern  . 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   Activity: no regular exercise, wants to start walking   Diet: good water, fruits/vegetables daily.    Family History:   Family History  Problem Relation Age of Onset  . Diabetes Mother   . Hypertension Mother   . Hyperlipidemia Mother   . Diabetes Father   . Diabetes Sister   . Hypertension Sister   . Hypertension Brother   . Diabetes Brother   . Diabetes Brother   . Hypertension Brother   . Diabetes Brother   . Heart disease Maternal Uncle        Heart failure  . Cancer Paternal Uncle        Prostate  . Cancer Paternal Grandfather        Prostate  . Stroke Neg Hx    Family Status:  Family Status  Relation Name Status  . Mother  Alive       50; EVFA [place]; DM; HTN; high chol.  . Father  Deceased at age 72       [address]  . Sister  Alive  . Brother  Alive  . Brother  Alive  . Brother   Alive  . Mat Uncle  (Not Specified)  . Annamarie Major  (Not Specified)  . PGF  (Not Specified)  . Neg Hx  (Not Specified)   ROS:  Please see the history of present illness.  All other ROS reviewed and negative.     Physical Exam/Data:   Vitals:   08/28/18 2127 08/28/18 2248 08/29/18 0531 08/29/18 0640  BP: (!) 145/80  117/65   Pulse: 70  78   Resp: 18  18   Temp: (!) 100.8 F (38.2 C) 99.5 F (37.5 C) (!) 100.7 F (38.2 C) 98.7 F (37.1 C)  TempSrc: Oral Oral Oral Oral  SpO2: 99%  96%   Weight:       No intake or output data in the 24 hours ending 08/29/18 1001 Filed Weights   08/26/18 1500  Weight: 103.7 kg   Body mass index is 34.76 kg/m.   General:  Well nourished, well developed, in no acute distress HEENT: normal Lymph: no adenopathy Neck: no JVD Endocrine:  No thryomegaly Vascular: No carotid bruits; 4/4 extremity pulses 2+, without bruits  Cardiac:  normal S1, S2; RRR; no murmur  Lungs:  clear to auscultation bilaterally, no wheezing, rhonchi or rales  Abd: soft, nontender, no hepatomegaly  Ext: no edema Musculoskeletal:  No deformities, BUE and BLE strength normal and equal Skin: warm and dry  Neuro:  CNs  2-12 intact, no focal abnormalities noted Psych:  Normal affect   EKG:  The EKG was personally reviewed and demonstrates: 08/25/2018 NSR with TWI in anterior leads with wondering baseline  Telemetry:  Telemetry was personally reviewed and demonstrates:  SR  Relevant CV Studies:  Echocardiogram 08/26/2018:  1. The left ventricle has normal systolic function, with an ejection fraction of 55-60%. The cavity size was normal. There is mildly increased left ventricular wall thickness. Left ventricular diastolic Doppler parameters are consistent with impaired  relaxation.  2. The right ventricle has normal systolic function. The cavity was normal.  3. The tricuspid valve is grossly normal.  4. The aortic valve is tricuspid. Mild thickening of the aortic valve.  Aortic valve regurgitation is trivial by color flow Doppler. No stenosis of the aortic valve.  5. There is mild dilatation of the ascending aorta measuring 41 mm.  6. Normal LV systolic function; mild diastolic dysfunction; mild LVH; trace AI; mildly dilated ascending aorta.  7. The mitral valve is grossly normal.  Lexiscan Myoview stress test: 08/27/2018   There was no ST segment deviation noted during stress.  T wave inversion was noted during stress in the II, III and aVF leads.  Defect 1: There is a medium defect of moderate severity present in the apical septal, apical lateral and apex location.  This is an intermediate risk study.  Nuclear stress EF: 61%. The left ventricular ejection fraction is normal (55-65%).  Findings consistent with ischemia of the apex  Cardiac catheterization 12/2007:  Left main coronary artery was normal.   Left anterior descending artery was normal.  In the proximal and  midportion, there was a 30% tubular lesion.  In the distal vessel,  there was a large intermediate branch which was normal.  The first  diagonal branch was normal.   Circumflex coronary artery was large and normal.  There were two obtuse  marginal branches which were normal.   The right coronary artery was dominant.  There was a single PDA and two  posterolateral branches which were normal.   RAO ventriculography.  RAO ventriculography was normal, EF was 65%.  There was no gradient across the aortic valve and no MR.   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   The aortic root was injected due to the patient's dilated aortic root by  echo and need to use JL-5 catheter.   Right heart catheterization:  Mean right atrial pressure was 16, RV  pressure was 37/15, PA  pressure was 30/14, mean pulmonary capillary  wedge pressure was 15, LV pressure was 158/70 and aortic pressure was  158/97.   IMPRESSION:  The patient  primarily would appear to have hypertensive  heart disease.  There is no evidence of coronary artery disease.  His  aortic root will have to be watched either by chest x-ray or possibly  MRA in the future.  He has no significant coronary artery disease.  His  chest pain would appear to be noncardiac in etiology.  He tolerated the  procedure well.  I will see him back in the office in 6 months.   Laboratory Data:  Chemistry Recent Labs  Lab 08/25/18 1457 08/26/18 0201 08/26/18 1524  NA 137 138  --   K 3.7 3.7  --   CL 104 104  --   CO2 20* 21*  --   GLUCOSE 156* 150*  --   BUN 14 11  --  CREATININE 0.85 0.81 0.86  CALCIUM 9.4 9.3  --   GFRNONAA >60 >60 >60  GFRAA >60 >60 >60  ANIONGAP 13 13  --     Total Protein  Date Value Ref Range Status  08/25/2018 8.0 6.5 - 8.1 g/dL Final   Albumin  Date Value Ref Range Status  08/25/2018 4.6 3.5 - 5.0 g/dL Final   AST  Date Value Ref Range Status  08/25/2018 20 15 - 41 U/L Final   ALT  Date Value Ref Range Status  08/25/2018 30 0 - 44 U/L Final   Alkaline Phosphatase  Date Value Ref Range Status  08/25/2018 49 38 - 126 U/L Final   Total Bilirubin  Date Value Ref Range Status  08/25/2018 0.4 0.3 - 1.2 mg/dL Final   Hematology Recent Labs  Lab 08/27/18 0532 08/28/18 0529 08/29/18 0450  WBC 15.4* 7.7 6.8  RBC 4.46 4.10* 4.24  HGB 13.3 12.3* 12.6*  HCT 39.8 37.8* 38.9*  MCV 89.2 92.2 91.7  MCH 29.8 30.0 29.7  MCHC 33.4 32.5 32.4  RDW 14.6 14.7 14.3  PLT 237 225 236   Cardiac Enzymes Recent Labs  Lab 08/25/18 1457 08/25/18 1830 08/26/18 0201  TROPONINI <0.03 <0.03 <0.03   No results for input(s): TROPIPOC in the last 168 hours.  BNPNo results for input(s): BNP, PROBNP in the last 168 hours.  DDimer  Recent Labs  Lab 08/26/18 0923  DDIMER 0.72*   TSH:  Lab Results  Component Value Date   TSH 1.502 12/26/2012   Lipids: Lab Results  Component Value Date   CHOL 148 03/04/2017   HDL 35.20 (L)  03/04/2017   LDLCALC 93 03/04/2017   LDLDIRECT 104 (H) 12/26/2012   TRIG 100.0 03/04/2017   CHOLHDL 4 03/04/2017   HgbA1c: Lab Results  Component Value Date   HGBA1C 9.2 (H) 07/28/2018   Radiology/Studies:  Ct Angio Chest Pe W Or Wo Contrast  Result Date: 08/26/2018 CLINICAL DATA:  Shortness of breath, fever and cough beginning yesterday. Upper abdominal pain. EXAM: CT ANGIOGRAPHY CHEST WITH CONTRAST TECHNIQUE: Multidetector CT imaging of the chest was performed using the standard protocol during bolus administration of intravenous contrast. Multiplanar CT image reconstructions and MIPs were obtained to evaluate the vascular anatomy. CONTRAST:  176mL OMNIPAQUE IOHEXOL 350 MG/ML SOLN COMPARISON:  Chest radiography same day FINDINGS: Cardiovascular: Pulmonary arterial opacification is good. There are no pulmonary emboli. Heart size is normal. Patient does not have advanced coronary artery calcification. No pericardial effusion. The aorta shows mild atherosclerosis but no aneurysm or dissection. Mediastinum/Nodes: No mediastinal or hilar mass or lymphadenopathy. Lungs/Pleura: No infiltrate, collapse or effusion. Possible bronchitis pattern. No evidence of bronchiectasis. No emphysema. Upper Abdomen: Negative Musculoskeletal: Ordinary thoracic degenerative changes. Previous ACDF C7-T1. Review of the MIP images confirms the above findings. IMPRESSION: No pulmonary emboli. No pulmonary infiltrate, collapse or effusion.  Possible bronchitis. Electronically Signed   By: Gad Aymond Chimes M.D.   On: 08/26/2018 14:41   Nm Myocar Multi W/spect W/wall Motion / Ef  Result Date: 08/28/2018  There was no ST segment deviation noted during stress.  T wave inversion was noted during stress in the II, III and aVF leads.  Defect 1: There is a medium defect of moderate severity present in the apical septal, apical lateral and apex location.  This is an intermediate risk study.  Nuclear stress EF: 61%. The left  ventricular ejection fraction is normal (55-65%).  Findings consistent with ischemia of the apex  Dg Chest Portable 1 View  Result Date: 08/25/2018 CLINICAL DATA:  Epigastric pain, shortness of breath EXAM: PORTABLE CHEST 1 VIEW COMPARISON:  12/26/2012 FINDINGS: The heart size and mediastinal contours are within normal limits. Both lungs are clear. The visualized skeletal structures are unremarkable. IMPRESSION: No active disease. Electronically Signed   By: Kathreen Devoid   On: 08/25/2018 15:45     Assessment and Plan:   1.  Atypical chest pain with abnormal stress test, however progressively worsening DOE -Patient presented to [facility] on 08/25/2018 with abdominal and chest pain which began earlier in the day prior to presentation. Patient reported that he was walking from his car to his house when he began having abdominal pain which radiated to his chest as well as bilateral arms with associated paresthesias. -Was previously been seen by her service dating back to 2009 when he underwent a cardiac catheterization 12/31/2007 which showed nonobstructive CAD with 30% proximal and mid LAD lesions and evidence primarily of hypertensive heart disease. He was noted to have a dilated aortic root per echocardiogram with recommendations to follow either by CXR or MRA. -On presentation, EKG with TWI in anterior leads with no acute ischemic changes -Troponin levels have been negative x3, <0.03, <0.03, <0.03 -Patient underwent an echocardiogram that showed normal LVEF with diastolic dysfunction -He continued to have chest pain and therefore underwent a Myoview stress test performed 08/27/2018 that showed a medium defect of moderate severity in the apical septal, apical lateral and apex location determined to be an intermediate risk study, findings consistent with ischemia of the apex -Given the above, cardiology has been asked to evaluate with plans to pursue a cardiac catheterization for further ischemic  evaluation -we will arrange for a left heart cath today, he is NPO,  discussed risks and benefits of procedure, he agrees -Continue ASA 81, statin -Will start low-dose carvedilol 3.125 mg twice daily -Increase statin to 80 mg daily  2.  Right upper quadrant pain: -Resolved, was noted to be ongoing for the last 2 months with initial imaging suspicious for pancreatic mass however subsequent MRI of the abdomen with no acute findings -Management per primary team  3.  DM2: -Uncontrolled, last hemoglobin A1c 9.1 -On SSI for glucose control inpatient status  4.  Hypertension: -Stable, 117/65, 145/80, 126/72 -On home losartan 50 mg daily  5.  Hyperlipidemia: -Last LDL, 93 on 03/04/2017 -Will increase atorvastatin to 80 mg daily for risk factor modification -AST/ALT, stable at 20/30 -Will need follow-up lipid panel in 6 to 8 weeks with LFTs  For questions or updates, please contact [place] Please consult [email or web address] for contact info under Cardiology/STEMI.   Signed, Ena Dawley, MD 08/29/2018 10:01 AM

## 2018-08-29 NOTE — H&P (View-Only) (Signed)
Cardiology Consultation:   Patient ID: JAISHAUN MCNAB; [id number]; 1950/09/20   Admit date: 08/25/2018 Date of Consult: 08/29/2018  Primary Care Provider: Ria Bush, MD Primary Cardiologist: New- Dr. Jenkins Rouge remotely (2013)  Patient Profile:   [name] is a 68 y.o. male with a hx of DM2, hypertension, hyperlipidemia, GERD, prostate cancer, OSA not requiring CPAP, depression/anxiety, chronic pain and remote history of substance abuse who is being seen today after an abnormal stress test at the request of Dr. Lupita Leash.  History of Present Illness:   Mr. Dorris is a 68 year old male who presented to [facility] on 08/25/2018 with chest pain. Per chart review, patient reported that he was in his usual state of health when he went to the grocery store to pick up his medications.  After returning home, while walking from his car to his house he felt more short of breath than expected. He reported that he sat down and began having right upper quadrant abdominal pain as well as central chest pain with radiation to bilateral arms with associated numbness and tingling. He reported that his symptoms lasted approximately 30 minutes in duration and improved after resting. Given his symptoms, he reported to the emergency department for further evaluation.  Of note, the patient has a prior history of right upper quadrant pain that is been intermittent for the last 2 months in which he underwent an abdominal ultrasound 07/31/2018 which showed changes consistent with fatty liver and was negative for gallstones. There was a small hypo-echoic lesion at the head of the pancreas and therefore he underwent an MRI of the abdomen 08/07/2018 that was negative for pancreatic mass or other significant abnormalities.  Mr. Radin was last seen by our service on 06/13/2011 for follow-up of chest pain. Per chart review, he underwent a cardiac catheterization 12/31/2007 per Dr. 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 During his last office visit, he noted having chronic recurrent atypical chest pain symptoms thought likely to be related to anxiety and cervical spine issues given their description. Risk modifications strategies with diet and exercise were discussed during this visit with plans to continue beta-blocker and ASA therapies and close follow-up thereafter given his prior normal cath. He has not been seen in our office since this time.  In the ED, troponin levels were found to be negative x3 at <0.03, <0.03,<0.03.  EKG showed a wandering baseline, NSR with no acute ischemic changes with TWI in anterior leads.  CXR with no acute process.  ASA 324 mg, SL NTG, morphine 4 mg IV x1 and was started on heparin GTT per pharmacy. He was admitted to the hospitalist service.  An echocardiogram was obtained 08/26/2018 which showed an LVEF of 55 to 59% with diastolic dysfunction. The patient continued to have chest pain during his hospital course and therefore a Lexiscan Myoview stress test was obtained 08/27/2018 which showed a medium defect of moderate severity present in the apical septal, apical lateral and apex location considered an intermediate risk study.  Findings were thought to be consistent with ischemia of the apex.  Given the above results, cardiology has been asked to evaluate for further recommendations.  Past Medical History:  Diagnosis Date  . Abdominal wall hernia   . Anxiety   . Back pain    with radiculopathy  . Bladder wall thickening 2013   nl rpt CT scan, normal cytology and UCx, released from urology Karsten Ro)  . Chest pain, non-cardiac 2008  adenosine myoview low risk.  EF 60%.  normal cath 2008, deemed atypical /noncardiac, ?discogenic  . Chronic neck pain   . Chronic pain    narcotic dependent ([place])  . Circadian rhythm sleep disorder, irregular sleep-wake type   . Complication of anesthesia    hard to put patient to sleep  . Depression   .  Diverticulosis 2013   by CT scan  . Esophageal stricture 06/2007   dilation of stricture  . GERD (gastroesophageal reflux disease)   . Gross hematuria 2013, 2016   negative CT, cystoscopy, cytology, 2016 - thought from E coli UTI s/p appropriate treatment ([place])  . History of cocaine use 2004  . History of epididymitis   . History of prostatitis 08/2011   ?chronic/simmering, treated with 1 mo cipro  . Hyperlipidemia   . Hypertension    Essential  . OSA (obstructive sleep apnea) 2010   mild off CPAP  . Postlaminectomy syndrome of cervical region    s/p ACDF C5-7 2010 ([place])  . Prostate cancer ([place]) 06/16/2012   T2a, Gleason 6 prostate cancer found by R apical nodule with PSA 2.18 Karsten Ro, [place]) 04/2016  . T2DM (type 2 diabetes mellitus) ([place])   . TIA (transient ischemic attack)    cocaine related    Past Surgical History:  Procedure Laterality Date  . ANTERIOR CERVICAL DISCECTOMY  10/28/2008   with fusion (Dr. Rolena Infante)  . CARDIAC CATHETERIZATION  2008   WNL   . CARPAL TUNNEL RELEASE     Right  . CATARACT EXTRACTION Bilateral 12/2014, 01/2015   Dr Tommy Rainwater  . COLONOSCOPY  10/11/2011   severe diverticulosis, int hemorrhoids, rec rpt 5 yrs Carlean Purl)  . DIRECT LARYNGOSCOPY  11/02/2008   Extubation under anesthesia (Dr. Erik Obey)  . ESOPHAGOGASTRODUODENOSCOPY  07/17/07   Esoph. Stricture dilated (Dr. Henrene Pastor)  . ESOPHAGOGASTRODUODENOSCOPY  10/11/2011   WNL Carlean Purl)  . EXCISION MASS NECK N/A 08/29/2016   Procedure: EXCISION MASS POSTERIOR NECK;  Surgeon: Erroll Luna, MD;  Location: [place];  Service: General;  Laterality: N/A;  . HEMATOMA EVACUATION  10/29/2008   (Dr. Rolena Infante)  . INGUINAL HERNIA REPAIR     bilateral, 5 cm. RLQ Abd Lipoma Excision (Dr. Lucia Gaskins)  . LIPOMA EXCISION  08/2016   posterior neck  . SHOULDER ARTHROSCOPY WITH SUBACROMIAL DECOMPRESSION AND OPEN ROTATOR C Left 01/23/2013   Procedure: LEFT SHOULDER ARTHROSCOPY WITH SUBACROMIAL  DECOMPRESSION AND MINI OPEN ROTATOR CUFF REPAIR, AND OPEN DISTAL CLAVICLE RESECTION;  Surgeon: Augustin Schooling, MD;  Location: [place];  Service: Orthopedics;  Laterality: Left;  . SHOULDER SURGERY Right 2013   replaced  . TONSILLECTOMY  childhood  . TRIGGER FINGER RELEASE     Right long finger     Prior to Admission medications   Medication Sig Start Date End Date Taking? Authorizing Provider  aspirin EC 81 MG tablet Take 81 mg by mouth daily. 10/25/11  Yes Thurnell Lose, MD  atorvastatin (LIPITOR) 10 MG tablet TAKE 1 TABLET BY MOUTH  DAILY Patient taking differently: Take 10 mg by mouth daily.  05/14/18  Yes Ria Bush, MD  baclofen (LIORESAL) 10 MG tablet TAKE 1 TABLET BY MOUTH AT  BEDTIME AS NEEDED FOR BACK  SPASMS Patient taking differently: Take 10 mg by mouth at bedtime as needed for muscle spasms.  07/28/18  Yes Ria Bush, MD  empagliflozin (JARDIANCE) 10 MG TABS tablet Take 10 mg by mouth daily. 07/30/18  Yes Ria Bush, MD  fluticasone [facility]) 50 MCG/ACT  nasal spray USE 2 SPRAYS IN EACH  NOSTRIL DAILY Patient taking differently: Place 2 sprays into both nostrils daily.  03/27/17  Yes Ria Bush, MD  gabapentin (NEURONTIN) 100 MG capsule TAKE 1 CAPSULE BY MOUTH TWO TIMES DAILY Patient taking differently: Take 100 mg by mouth 2 (two) times daily.  07/28/18  Yes Ria Bush, MD  glipiZIDE (GLUCOTROL XL) 10 MG 24 hr tablet TAKE 1 TABLET BY MOUTH  DAILY WITH BREAKFAST Patient taking differently: Take 10 mg by mouth daily with breakfast.  06/03/17  Yes Ria Bush, MD  metFORMIN (GLUCOPHAGE) 1000 MG tablet TAKE 1 TABLET BY MOUTH 2  TIMES DAILY WITH MEALS Patient taking differently: Take 1,000 mg by mouth 2 (two) times daily with a meal.  07/28/18  Yes Ria Bush, MD  oxyCODONE-acetaminophen (PERCOCET) 10-325 MG tablet Take 1 tablet by mouth every 4 (four) hours as needed for pain.    Yes [provider]  chlorhexidine (PERIDEX) 0.12 %  solution Use as directed 15 mLs in the mouth or throat 2 (two) times daily. Patient not taking: Reported on 08/25/2018 08/14/18   Ria Bush, MD  glucose blood (ONE TOUCH ULTRA TEST) test strip USE WITH METER TO CHECK  SUGAR TWICE DAILY. 03/10/18   Ria Bush, MD  losartan (COZAAR) 50 MG tablet TAKE 1 TABLET BY MOUTH  DAILY Patient taking differently: Take 50 mg by mouth daily.  08/12/18   Ria Bush, MD  triamcinolone (KENALOG) 0.1 % paste Use as directed 1 application in the mouth or throat 2 (two) times daily. Patient not taking: Reported on 08/25/2018 08/08/18   Ria Bush, MD    Inpatient Medications: Scheduled Meds: . aspirin  81 mg Oral Daily  . [START ON 08/30/2018] atorvastatin  80 mg Oral Daily  . carvedilol  3.125 mg Oral BID WC  . enoxaparin (LOVENOX) injection  40 mg Subcutaneous Q24H  . gabapentin  100 mg Oral BID  . insulin aspart  0-9 Units Subcutaneous TID WC  . losartan  50 mg Oral Daily   Continuous Infusions:  PRN Meds: acetaminophen, alum & mag hydroxide-simeth, magnesium hydroxide, nitroGLYCERIN, ondansetron (ZOFRAN) IV, oxyCODONE-acetaminophen **AND** oxyCODONE  Allergies:    Allergies  Allergen Reactions  . Gabapentin     Dizziness  . Glimepiride Other (See Comments)    Headache, back pain  . Menthol (Topical Analgesic) Rash    Burning rash    Social History:   Social History   Socioeconomic History  . Marital status: Single    Spouse name: Not on file  . Number of children: Not on file  . Years of education: Not on file  . Highest education level: Not on file  Occupational History  . Occupation: [name] previously    Fish farm manager: UNEMPLOYED    Comment: Now on disability after neck surgery  Social Needs  . Financial resource strain: Not on file  . Food insecurity:    Worry: Not on file    Inability: Not on file  . Transportation needs:    Medical: Not on file    Non-medical: Not on file  Tobacco Use  . Smoking  status: Never Smoker  . Smokeless tobacco: Never Used  Substance and Sexual Activity  . Alcohol use: No    Alcohol/week: 0.0 standard drinks  . Drug use: No    Comment: H/O marijuana (last 2005); Cocaine 5/04  . Sexual activity: Never  Lifestyle  . Physical activity:    Days per week: Not on file  Minutes per session: Not on file  . Stress: Not on file  Relationships  . Social connections:    Talks on phone: Not on file    Gets together: Not on file    Attends religious service: Not on file    Active member of club or organization: Not on file    Attends meetings of clubs or organizations: Not on file    Relationship status: Not on file  . Intimate partner violence:    Fear of current or ex partner: Not on file    Emotionally abused: Not on file    Physically abused: Not on file    Forced sexual activity: Not on file  Other Topics Concern  . 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   Activity: no regular exercise, wants to start walking   Diet: good water, fruits/vegetables daily.    Family History:   Family History  Problem Relation Age of Onset  . Diabetes Mother   . Hypertension Mother   . Hyperlipidemia Mother   . Diabetes Father   . Diabetes Sister   . Hypertension Sister   . Hypertension Brother   . Diabetes Brother   . Diabetes Brother   . Hypertension Brother   . Diabetes Brother   . Heart disease Maternal Uncle        Heart failure  . Cancer Paternal Uncle        Prostate  . Cancer Paternal Grandfather        Prostate  . Stroke Neg Hx    Family Status:  Family Status  Relation Name Status  . Mother  Alive       29; EVFA [place]; DM; HTN; high chol.  . Father  Deceased at age 30       [place] building  . Sister  Alive  . Brother  Alive  . Brother  Alive  . Brother   Alive  . Mat Uncle  (Not Specified)  . Annamarie Major  (Not Specified)  . PGF  (Not Specified)  . Neg Hx  (Not Specified)   ROS:  Please see the history of present illness.  All other ROS reviewed and negative.     Physical Exam/Data:   Vitals:   08/28/18 2127 08/28/18 2248 08/29/18 0531 08/29/18 0640  BP: (!) 145/80  117/65   Pulse: 70  78   Resp: 18  18   Temp: (!) 100.8 F (38.2 C) 99.5 F (37.5 C) (!) 100.7 F (38.2 C) 98.7 F (37.1 C)  TempSrc: Oral Oral Oral Oral  SpO2: 99%  96%   Weight:       No intake or output data in the 24 hours ending 08/29/18 1001 Filed Weights   08/26/18 1500  Weight: 103.7 kg   Body mass index is 34.76 kg/m.   General:  Well nourished, well developed, in no acute distress HEENT: normal Lymph: no adenopathy Neck: no JVD Endocrine:  No thryomegaly Vascular: No carotid bruits; 4/4 extremity pulses 2+, without bruits  Cardiac:  normal S1, S2; RRR; no murmur  Lungs:  clear to auscultation bilaterally, no wheezing, rhonchi or rales  Abd: soft, nontender, no hepatomegaly  Ext: no edema Musculoskeletal:  No deformities, BUE and BLE strength normal and equal Skin: warm and dry  Neuro:  CNs  2-12 intact, no focal abnormalities noted Psych:  Normal affect   EKG:  The EKG was personally reviewed and demonstrates: 08/25/2018 NSR with TWI in anterior leads with wondering baseline  Telemetry:  Telemetry was personally reviewed and demonstrates:  SR  Relevant CV Studies:  Echocardiogram 08/26/2018:  1. The left ventricle has normal systolic function, with an ejection fraction of 55-60%. The cavity size was normal. There is mildly increased left ventricular wall thickness. Left ventricular diastolic Doppler parameters are consistent with impaired  relaxation.  2. The right ventricle has normal systolic function. The cavity was normal.  3. The tricuspid valve is grossly normal.  4. The aortic valve is tricuspid. Mild thickening of the aortic valve.  Aortic valve regurgitation is trivial by color flow Doppler. No stenosis of the aortic valve.  5. There is mild dilatation of the ascending aorta measuring 41 mm.  6. Normal LV systolic function; mild diastolic dysfunction; mild LVH; trace AI; mildly dilated ascending aorta.  7. The mitral valve is grossly normal.  Lexiscan Myoview stress test: 08/27/2018   There was no ST segment deviation noted during stress.  T wave inversion was noted during stress in the II, III and aVF leads.  Defect 1: There is a medium defect of moderate severity present in the apical septal, apical lateral and apex location.  This is an intermediate risk study.  Nuclear stress EF: 61%. The left ventricular ejection fraction is normal (55-65%).  Findings consistent with ischemia of the apex  Cardiac catheterization 12/2007:  Left main coronary artery was normal.   Left anterior descending artery was normal.  In the proximal and  midportion, there was a 30% tubular lesion.  In the distal vessel,  there was a large intermediate branch which was normal.  The first  diagonal branch was normal.   Circumflex coronary artery was large and normal.  There were two obtuse  marginal branches which were normal.   The right coronary artery was dominant.  There was a single PDA and two  posterolateral branches which were normal.   RAO ventriculography.  RAO ventriculography was normal, EF was 65%.  There was no gradient across the aortic valve and no MR.   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   The aortic root was injected due to the patient's dilated aortic root by  echo and need to use JL-5 catheter.   Right heart catheterization:  Mean right atrial pressure was 16, RV  pressure was 37/15, PA  pressure was 30/14, mean pulmonary capillary  wedge pressure was 15, LV pressure was 158/70 and aortic pressure was  158/97.   IMPRESSION:  The patient  primarily would appear to have hypertensive  heart disease.  There is no evidence of coronary artery disease.  His  aortic root will have to be watched either by chest x-ray or possibly  MRA in the future.  He has no significant coronary artery disease.  His  chest pain would appear to be noncardiac in etiology.  He tolerated the  procedure well.  I will see him back in the office in 6 months.   Laboratory Data:  Chemistry Recent Labs  Lab 08/25/18 1457 08/26/18 0201 08/26/18 1524  NA 137 138  --   K 3.7 3.7  --   CL 104 104  --   CO2 20* 21*  --   GLUCOSE 156* 150*  --   BUN 14 11  --  CREATININE 0.85 0.81 0.86  CALCIUM 9.4 9.3  --   GFRNONAA >60 >60 >60  GFRAA >60 >60 >60  ANIONGAP 13 13  --     Total Protein  Date Value Ref Range Status  08/25/2018 8.0 6.5 - 8.1 g/dL Final   Albumin  Date Value Ref Range Status  08/25/2018 4.6 3.5 - 5.0 g/dL Final   AST  Date Value Ref Range Status  08/25/2018 20 15 - 41 U/L Final   ALT  Date Value Ref Range Status  08/25/2018 30 0 - 44 U/L Final   Alkaline Phosphatase  Date Value Ref Range Status  08/25/2018 49 38 - 126 U/L Final   Total Bilirubin  Date Value Ref Range Status  08/25/2018 0.4 0.3 - 1.2 mg/dL Final   Hematology Recent Labs  Lab 08/27/18 0532 08/28/18 0529 08/29/18 0450  WBC 15.4* 7.7 6.8  RBC 4.46 4.10* 4.24  HGB 13.3 12.3* 12.6*  HCT 39.8 37.8* 38.9*  MCV 89.2 92.2 91.7  MCH 29.8 30.0 29.7  MCHC 33.4 32.5 32.4  RDW 14.6 14.7 14.3  PLT 237 225 236   Cardiac Enzymes Recent Labs  Lab 08/25/18 1457 08/25/18 1830 08/26/18 0201  TROPONINI <0.03 <0.03 <0.03   No results for input(s): TROPIPOC in the last 168 hours.  BNPNo results for input(s): BNP, PROBNP in the last 168 hours.  DDimer  Recent Labs  Lab 08/26/18 0923  DDIMER 0.72*   TSH:  Lab Results  Component Value Date   TSH 1.502 12/26/2012   Lipids: Lab Results  Component Value Date   CHOL 148 03/04/2017   HDL 35.20 (L)  03/04/2017   LDLCALC 93 03/04/2017   LDLDIRECT 104 (H) 12/26/2012   TRIG 100.0 03/04/2017   CHOLHDL 4 03/04/2017   HgbA1c: Lab Results  Component Value Date   HGBA1C 9.2 (H) 07/28/2018   Radiology/Studies:  Ct Angio Chest Pe W Or Wo Contrast  Result Date: 08/26/2018 CLINICAL DATA:  Shortness of breath, fever and cough beginning yesterday. Upper abdominal pain. EXAM: CT ANGIOGRAPHY CHEST WITH CONTRAST TECHNIQUE: Multidetector CT imaging of the chest was performed using the standard protocol during bolus administration of intravenous contrast. Multiplanar CT image reconstructions and MIPs were obtained to evaluate the vascular anatomy. CONTRAST:  135mL OMNIPAQUE IOHEXOL 350 MG/ML SOLN COMPARISON:  Chest radiography same day FINDINGS: Cardiovascular: Pulmonary arterial opacification is good. There are no pulmonary emboli. Heart size is normal. Patient does not have advanced coronary artery calcification. No pericardial effusion. The aorta shows mild atherosclerosis but no aneurysm or dissection. Mediastinum/Nodes: No mediastinal or hilar mass or lymphadenopathy. Lungs/Pleura: No infiltrate, collapse or effusion. Possible bronchitis pattern. No evidence of bronchiectasis. No emphysema. Upper Abdomen: Negative Musculoskeletal: Ordinary thoracic degenerative changes. Previous ACDF C7-T1. Review of the MIP images confirms the above findings. IMPRESSION: No pulmonary emboli. No pulmonary infiltrate, collapse or effusion.  Possible bronchitis. Electronically Signed   By: Shyrl Obi Chimes M.D.   On: 08/26/2018 14:41   Nm Myocar Multi W/spect W/wall Motion / Ef  Result Date: 08/28/2018  There was no ST segment deviation noted during stress.  T wave inversion was noted during stress in the II, III and aVF leads.  Defect 1: There is a medium defect of moderate severity present in the apical septal, apical lateral and apex location.  This is an intermediate risk study.  Nuclear stress EF: 61%. The left  ventricular ejection fraction is normal (55-65%).  Findings consistent with ischemia of the apex  Dg Chest Portable 1 View  Result Date: 08/25/2018 CLINICAL DATA:  Epigastric pain, shortness of breath EXAM: PORTABLE CHEST 1 VIEW COMPARISON:  12/26/2012 FINDINGS: The heart size and mediastinal contours are within normal limits. Both lungs are clear. The visualized skeletal structures are unremarkable. IMPRESSION: No active disease. Electronically Signed   By: Kathreen Devoid   On: 08/25/2018 15:45     Assessment and Plan:   1.  Atypical chest pain with abnormal stress test, however progressively worsening DOE -Patient presented to [facility] on 08/25/2018 with abdominal and chest pain which began earlier in the day prior to presentation. Patient reported that he was walking from his car to his house when he began having abdominal pain which radiated to his chest as well as bilateral arms with associated paresthesias. -Was previously been seen by her service dating back to 2009 when he underwent a cardiac catheterization 12/31/2007 which showed nonobstructive CAD with 30% proximal and mid LAD lesions and evidence primarily of hypertensive heart disease. He was noted to have a dilated aortic root per echocardiogram with recommendations to follow either by CXR or MRA. -On presentation, EKG with TWI in anterior leads with no acute ischemic changes -Troponin levels have been negative x3, <0.03, <0.03, <0.03 -Patient underwent an echocardiogram that showed normal LVEF with diastolic dysfunction -He continued to have chest pain and therefore underwent a Myoview stress test performed 08/27/2018 that showed a medium defect of moderate severity in the apical septal, apical lateral and apex location determined to be an intermediate risk study, findings consistent with ischemia of the apex -Given the above, cardiology has been asked to evaluate with plans to pursue a cardiac catheterization for further ischemic  evaluation -we will arrange for a left heart cath today, he is NPO,  discussed risks and benefits of procedure, he agrees -Continue ASA 81, statin -Will start low-dose carvedilol 3.125 mg twice daily -Increase statin to 80 mg daily  2.  Right upper quadrant pain: -Resolved, was noted to be ongoing for the last 2 months with initial imaging suspicious for pancreatic mass however subsequent MRI of the abdomen with no acute findings -Management per primary team  3.  DM2: -Uncontrolled, last hemoglobin A1c 9.1 -On SSI for glucose control inpatient status  4.  Hypertension: -Stable, 117/65, 145/80, 126/72 -On home losartan 50 mg daily  5.  Hyperlipidemia: -Last LDL, 93 on 03/04/2017 -Will increase atorvastatin to 80 mg daily for risk factor modification -AST/ALT, stable at 20/30 -Will need follow-up lipid panel in 6 to 8 weeks with LFTs  For questions or updates, please contact [place] Please consult [email or web address] for contact info under Cardiology/STEMI.   Signed, Ena Dawley, MD 08/29/2018 10:01 AM

## 2018-08-29 NOTE — Progress Notes (Signed)
Patient was placed on my schedule today without realizing he was still in the hospital. I called patient who is currently in the hospital at [facility].  Hospitalized with atypical chest pain, treated in ER with aspirin 325mg  and SL nitro as well as heparin drip - now off heparin.  Due to ongoing chest pain, had stable echocardiogram (mild DD, normal EF). Also had abnormal Lexiscan stress test - medium defect of mod severity in apical septal, apical lateral and apex - intermediate risk study concerning for ischemia. Reviewed hospital course with patient, anticipate cardiology is deciding on whether or not to proceed with cardiac catheterization.  He states he would like to re establish with Dr Johnsie Cancel once discharged (last seen remotely).

## 2018-08-29 NOTE — Discharge Summary (Addendum)
Physician Discharge Summary  [name] OJJ:[id number] DOB: [birth year] DOA: 08/25/2018  PCP: Ria Bush, MD  Admit date: 08/25/2018 Discharge date: 08/29/2018  Admitted From: home Disposition:  home  Recommendations for Outpatient Follow-up:  1. Follow up with PCP in 1-2 weeks 2. Please obtain BMP/CBC in one week 3. 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 Patient is EKG showed new T wave inversion in anterior leads and lateral leads. Although patient's troponin was negative but he was started on heparin drip. Subsequent troponins have been negative x3 so far. Patient had low-grade fever. Chest x-ray was negative he was tested negative for COVID-19 as well as influenza and respiratory viral panel. His d-dimer was elevated for his age so CT angiogram of the chest was done which was unremarkable as well.   nuclear stress test was done for chest pain and is abnormal with apical ischemia.  Cardiology was consulted. Patient underwent cardiac cath that was clean and cardiology advised discharge home post Cath Lab from [facility] short stay.   Subjective: No acute events overnight.  Denies chest pain nausea vomiting.for cath today  Assessment & Plan:   Chest pain with negative serial  troponin, CTPA negative for PE. Dr Doristine Bosworth discussed with Dr. Meda Coffee from cardiology and proceeded with stress test 5/6.  initially on heparin gtt. Nuclear test came back abnormal w ischemia in apex.  Cardiology consulted and went for cardiac cath that was clean.  Continue on asa 81, lipitor and added coreg.  Low-grade fever with leukocytosis currently no obvious infection noted with unremarkable urinalysis, influenza and respiratory virus panel and COVID 19 negative. procal was up at 11.4 but donwtrending-unclear cause, obtained blood culture and is negative so far.  Patient has been afebrile and leukocytosis resolved.  Advised to follow-up with PCP or return to ER if he has fever or any other signs of infection.   RUQ pain, resolved.  Ongoing for 2 months, additional work-up with MRI of the abdomen and ultrasound unrevealing.  Currently electrically stable. T2 DM stable, a1c 9.1  In April. On OHA. continue home regimen HTN: Controlled on losartan. HLD- cont Lipitor. Chronic pain/neuropathy on home pain regimen and Neurontin.  DVT prophylaxis: lovenox Code Status: full Family Communication: Patient updated regarding plan of care and in agreement.  Disposition Plan: home  Consultants:  Cardiology  Procedures: Stress test-abnormal Cardiac cath- clean  Discharge Diagnoses:  Principal Problem:   Chest pain Active Problems:   Diabetes mellitus type 2,  HYPERCHOLESTEROLEMIA, PURE   Essential hypertension   Abnormal stress test    Discharge Instructions  Discharge Instructions    Diet - low sodium heart healthy   Complete by:  As directed    Discharge instructions   Complete by:  As directed    Please call call MD or return to ER for similar recurring problem, nausea/vomiting, uncontrolled pain, abdominal pain chest pain, shortness of breath, fever. Please follow-up your doctor as instructed in a week time and call the office for appointment. Please avoid alcohol, smoking,  or any other illicit substance.   Increase activity slowly   Complete by:  As directed      Allergies as of 08/29/2018       Reactions   Gabapentin    Dizziness   Glimepiride Other (See Comments)   Headache, back pain   Menthol (topical Analgesic) Rash   Burning rash      Medication List    TAKE these medications   aspirin EC 81 MG tablet Take 81 mg by mouth daily.   atorvastatin 80 MG tablet Commonly known as:  LIPITOR Take 1 tablet (80 mg total) by mouth daily for 30 days. What changed:    medication strength  how much to take   baclofen 10 MG tablet Commonly known as:  LIORESAL TAKE 1 TABLET BY MOUTH AT  BEDTIME AS NEEDED FOR BACK  SPASMS What changed:    how much to take  how to take this  when to take this  reasons to take this  additional instructions   carvedilol 3.125 MG tablet Commonly known as:  COREG Take 1 tablet (3.125 mg total) by mouth 2 (two) times daily with a meal for 30 days.   chlorhexidine 0.12 % solution Commonly known as:  PERIDEX Use as directed 15 mLs in the mouth or throat 2 (two) times daily.   empagliflozin 10 MG Tabs tablet Commonly known as:  Jardiance Take 10 mg by mouth daily.   fluticasone 50 MCG/ACT nasal spray Commonly known as:  FLONASE USE 2 SPRAYS IN EACH  NOSTRIL DAILY   gabapentin 100 MG capsule Commonly known as:  NEURONTIN TAKE 1 CAPSULE BY MOUTH TWO TIMES DAILY What changed:    how much to take  how to take this  when to take this  additional instructions   glipiZIDE 10 MG 24 hr tablet Commonly known as:  GLUCOTROL XL TAKE 1 TABLET BY MOUTH  DAILY WITH BREAKFAST   glucose blood test strip Commonly known as:  ONE TOUCH ULTRA TEST USE WITH METER TO CHECK  SUGAR TWICE DAILY.   losartan 50 MG tablet Commonly known as:  COZAAR TAKE 1 TABLET BY MOUTH  DAILY   metFORMIN 1000 MG tablet Commonly known as:  GLUCOPHAGE Take 1 tablet (1,000 mg total) by mouth 2 (two) times daily with a meal. Start taking on:  Aug 31, 2018 What changed:    how much to take  how to take this  when to take this  additional  instructions  These instructions start on Aug 31, 2018. If you are unsure what to do until then, ask your doctor or other care provider.   oxyCODONE-acetaminophen 10-325 MG tablet Commonly known as:  PERCOCET Take 1 tablet by mouth every 4 (four) hours as needed for pain.   triamcinolone 0.1 % paste Commonly known as:  KENALOG Use as directed 1 application in the mouth or throat 2 (two) times daily.      Follow-up Information    Ria Bush, MD Follow up in 1 week(s).   Specialty:  Family Medicine Contact information: [place] [address] [phone]          Allergies  Allergen Reactions  . Gabapentin     Dizziness  . Glimepiride Other (See Comments)    Headache, back pain  . Menthol (Topical Analgesic) Rash    Burning rash    Consultations:  Cardiology  Procedures/Studies: Ct Soft Tissue Neck W Contrast  Result Date: 08/09/2018 CLINICAL DATA:  68 year old male with ?abscess under tongue x3 weeks, recent white drainage?Marland Kitchen EXAM: CT NECK WITH CONTRAST TECHNIQUE: Multidetector CT imaging of the neck was performed using the standard protocol following the bolus administration of intravenous contrast. CONTRAST:  9mL OMNIPAQUE IOHEXOL 300 MG/ML  SOLN COMPARISON:  Cervical spine CT 02/18/2012. FINDINGS: Pharynx and larynx: Mild motion artifact at the larynx which appears to remain within normal limits. Pharyngeal soft tissue contours are within normal limits. Negative parapharyngeal and retropharyngeal spaces. Oropharynx/oral tongue appears within normal limits. Sublingual space described below. Salivary glands: The sublingual space appears symmetric and within normal limits. No sublingual mass or fluid collection identified. There is carious mandible anterior left bicuspid and right canine dentition with some periapical lucency but no other complicating features evident. Submandibular spaces and glands appear symmetric and within normal limits.  Parotid glands are within normal limits. Thyroid: Negative. Lymph nodes: Negative. No lymphadenopathy. Vascular: Major vascular structures in the neck and at the skull base appear patent. Tortuous cervical ICAs and proximal left CCA. Limited intracranial: Negative. Visualized orbits: Negative aside from postoperative changes to both globes. Mastoids and visualized paranasal sinuses: Clear bilaterally. Skeleton: Mandible dental caries described above. Prior C5-C6 and C7-T1 ACDF. Solid C6-C7 arthrodesis. Suspected pseudoarthrosis at C5-C6. Possible arthrodesis at C7-T1. No acute osseous abnormality identified. Upper chest: Tortuous proximal great vessels but otherwise negative visible superior mediastinum. Lung apices appears stable and negative. IMPRESSION: 1. Mandibular dental disease, but no abscess, lingual or sublingual abnormality identified. 2. Previous C5-C6 through C7-T1 ACDF with evidence of C5-C6 pseudoarthrosis. Electronically Signed   By: Genevie Ann M.D.   On: 08/09/2018 17:14   Ct Angio Chest Pe W Or Wo Contrast  Result Date: 08/26/2018 CLINICAL DATA:  Shortness of breath, fever and cough beginning yesterday. Upper abdominal pain. EXAM: CT ANGIOGRAPHY CHEST WITH CONTRAST TECHNIQUE: Multidetector CT imaging of the chest was performed using the standard protocol during bolus administration of intravenous contrast. Multiplanar CT image reconstructions and MIPs were obtained to evaluate the vascular anatomy. CONTRAST:  146mL OMNIPAQUE IOHEXOL 350 MG/ML SOLN COMPARISON:  Chest radiography same day FINDINGS: Cardiovascular: Pulmonary arterial opacification is good. There are no pulmonary emboli. Heart size is normal. Patient does not have advanced coronary artery calcification. No pericardial effusion. The aorta shows mild atherosclerosis but no aneurysm or dissection. Mediastinum/Nodes: No mediastinal or hilar mass or lymphadenopathy. Lungs/Pleura: No infiltrate, collapse or effusion. Possible bronchitis  pattern. No evidence of bronchiectasis. No emphysema. Upper Abdomen: Negative Musculoskeletal: Ordinary thoracic degenerative changes. Previous ACDF C7-T1. Review of the MIP images confirms the above findings. IMPRESSION: No pulmonary emboli. No pulmonary infiltrate, collapse or effusion.  Possible bronchitis. Electronically Signed   By: Nelson Chimes M.D.   On: 08/26/2018 14:41   Mr Abdomen W Wo Contrast  Result Date: 08/07/2018 CLINICAL DATA:  Right upper quadrant pain for 2 months. Possible small pancreatic mass on recent ultrasound. EXAM: MRI ABDOMEN WITHOUT AND WITH CONTRAST TECHNIQUE: Multiplanar multisequence MR imaging of the abdomen was performed both before and after the administration of intravenous contrast. CONTRAST:  32mL MULTIHANCE GADOBENATE DIMEGLUMINE 529 MG/ML IV SOLN COMPARISON:  Abdomen ultrasound on 07/31/2018, and CT on 12/08/2014 FINDINGS: Lower chest: No acute findings. Hepatobiliary: Tiny sub-cm cyst seen in the anterior right hepatic lobe. A tiny subcapsular vascular shunt is seen in the posterior right lobe. In addition, there is a subcapsular hypervascular focus in the posterior right hepatic lobe on image 18/19 measuring 10 mm. This shows T2 isointensity, and may represent a vascular shunt or  benign focal nodular hyperplasia. No malignant appearing hepatic masses are identified. Gallbladder is unremarkable. No evidence of biliary ductal dilatation. Pancreas: No pancreatic mass or inflammatory process identified. No evidence of pancreatic ductal dilatation. Spleen:  Within normal limits in size and appearance. Adrenals/Urinary Tract: No masses identified. A few tiny sub-cm renal cysts are seen bilaterally. No evidence of hydronephrosis. Stomach/Bowel: Visualized portion unremarkable. Vascular/Lymphatic: No pathologically enlarged lymph nodes identified. No abdominal aortic aneurysm. Other:  None. Musculoskeletal:  No suspicious bone lesions identified. IMPRESSION: No evidence of  pancreatic mass or other significant abnormality. Electronically Signed   By: Earle Gell M.D.   On: 08/07/2018 08:25   US Abdomen Complete  Result Date: 07/31/2018 CLINICAL DATA:  Right upper quadrant pain 2 months EXAM: ABDOMEN ULTRASOUND COMPLETE COMPARISON:  CT abdomen pelvis 12/08/2014 FINDINGS: Gallbladder: No gallstones or wall thickening visualized. No sonographic Murphy sign noted by sonographer. Common bile duct: Diameter: 5.0 mm Liver: Diffusely increased echogenic liver without focal mass lesion. Portal vein is patent on color Doppler imaging with normal direction of blood flow towards the liver. IVC: No abnormality visualized. Pancreas: 1.8 x 1.3 x 1.7 cm hypoechoic lesion in the head of the pancreas. This is not seen on the prior CT. Possible mass lesion. Spleen: Size and appearance within normal limits. Right Kidney: Length: 12.4 cm. 8 mm midpole cyst. Echogenicity within normal limits. No mass or hydronephrosis visualized. Left Kidney: Length: 12.8 cm. Echogenicity within normal limits. No mass or hydronephrosis visualized. Abdominal aorta: No aneurysm visualized. Other findings: None. IMPRESSION: 1. Hyperechoic liver consistent with fatty infiltration 2. Negative for gallstones 3. 1.8 x 1.3 x 1.7 cm hypoechoic lesion head of the pancreas. Possible neoplasm. Recommend follow-up MRI without and with intravenous contrast. Electronically Signed   By: Franchot Gallo M.D.   On: 07/31/2018 10:13   Nm Myocar Multi W/spect W/wall Motion / Ef  Result Date: 08/28/2018  There was no ST segment deviation noted during stress.  T wave inversion was noted during stress in the II, III and aVF leads.  Defect 1: There is a medium defect of moderate severity present in the apical septal, apical lateral and apex location.  This is an intermediate risk study.  Nuclear stress EF: 61%. The left ventricular ejection fraction is normal (55-65%).  Findings consistent with ischemia of the apex    Dg Chest  Portable 1 View  Result Date: 08/25/2018 CLINICAL DATA:  Epigastric pain, shortness of breath EXAM: PORTABLE CHEST 1 VIEW COMPARISON:  12/26/2012 FINDINGS: The heart size and mediastinal contours are within normal limits. Both lungs are clear. The visualized skeletal structures are unremarkable. IMPRESSION: No active disease. Electronically Signed   By: Kathreen Devoid   On: 08/25/2018 15:45    Stress test Cardiac cath Discharge Exam: Vitals:   08/29/18 1650 08/29/18 1655  BP: 135/77 126/71  Pulse: 68 69  Resp: 14 15  Temp:    SpO2: 100% 100%   Vitals:   08/29/18 1640 08/29/18 1645 08/29/18 1650 08/29/18 1655  BP: 115/72 140/77 135/77 126/71  Pulse: 68 68 68 69  Resp: 13 18 14 15   Temp:      TempSrc:      SpO2: 100% 90% 100% 100%  Weight:        General: Pt is alert, awake, not in acute distress Cardiovascular: RRR, S1/S2 +, no rubs, no gallops Respiratory: CTA bilaterally, no wheezing, no rhonchi Abdominal: Soft, NT, ND, bowel sounds + Extremities: no edema, no cyanosis   The  results of significant diagnostics from this hospitalization (including imaging, microbiology, ancillary and laboratory) are listed below for reference.     Microbiology: Recent Results (from the past 240 hour(s))  SARS Coronavirus 2 [facility] order, Performed in [facility] hospital lab)     Status: None   Collection Time: 08/25/18  3:32 PM  Result Value Ref Range Status   SARS Coronavirus 2 NEGATIVE NEGATIVE Final    Comment: (NOTE) If result is NEGATIVE SARS-CoV-2 target nucleic acids are NOT DETECTED. The SARS-CoV-2 RNA is generally detectable in upper and lower  respiratory specimens during the acute phase of infection. The lowest  concentration of SARS-CoV-2 viral copies this assay can detect is 250  copies / mL. A negative result does not preclude SARS-CoV-2 infection  and should not be used as the sole basis for treatment or other  patient management decisions.  A negative result may occur  with  improper specimen collection / handling, submission of specimen other  than nasopharyngeal swab, presence of viral mutation(s) within the  areas targeted by this assay, and inadequate number of viral copies  (<250 copies / mL). A negative result must be combined with clinical  observations, patient history, and epidemiological information. If result is POSITIVE SARS-CoV-2 target nucleic acids are DETECTED. The SARS-CoV-2 RNA is generally detectable in upper and lower  respiratory specimens dur ing the acute phase of infection.  Positive  results are indicative of active infection with SARS-CoV-2.  Clinical  correlation with patient history and other diagnostic information is  necessary to determine patient infection status.  Positive results do  not rule out bacterial infection or co-infection with other viruses. If result is PRESUMPTIVE POSTIVE SARS-CoV-2 nucleic acids MAY BE PRESENT.   A presumptive positive result was obtained on the submitted specimen  and confirmed on repeat testing.  While 2019 novel coronavirus  (SARS-CoV-2) nucleic acids may be present in the submitted sample  additional confirmatory testing may be necessary for epidemiological  and / or clinical management purposes  to differentiate between  SARS-CoV-2 and other Sarbecovirus currently known to infect humans.  If clinically indicated additional testing with an alternate test  methodology [phone]) is advised. The SARS-CoV-2 RNA is generally  detectable in upper and lower respiratory sp ecimens during the acute  phase of infection. The expected result is Negative. Fact Sheet for Patients:  StrictlyIdeas.no Fact Sheet for Healthcare Providers: BankingDealers.co.za This test is not yet approved or cleared by the [place] FDA and has been authorized for detection and/or diagnosis of SARS-CoV-2 by FDA under an Emergency Use Authorization (EUA).  This EUA  will remain in effect (meaning this test can be used) for the duration of the COVID-19 declaration under Section 564(b)(1) of the Act, 21 U.S.C. section 360bbb-3(b)(1), unless the authorization is terminated or revoked sooner. Performed at [facility], [place] [address] [place], [place] [address]   Respiratory Panel by PCR     Status: None   Collection Time: 08/26/18  8:08 AM  Result Value Ref Range Status   Adenovirus NOT DETECTED NOT DETECTED Final   Coronavirus 229E NOT DETECTED NOT DETECTED Final    Comment: (NOTE) The Coronavirus on the Respiratory Panel, DOES NOT test for the novel  Coronavirus (2019 nCoV)    Coronavirus HKU1 NOT DETECTED NOT DETECTED Final   Coronavirus NL63 NOT DETECTED NOT DETECTED Final   Coronavirus OC43 NOT DETECTED NOT DETECTED Final   Metapneumovirus NOT DETECTED NOT DETECTED Final   Rhinovirus / Enterovirus  NOT DETECTED NOT DETECTED Final   Influenza A NOT DETECTED NOT DETECTED Final   Influenza B NOT DETECTED NOT DETECTED Final   Parainfluenza Virus 1 NOT DETECTED NOT DETECTED Final   Parainfluenza Virus 2 NOT DETECTED NOT DETECTED Final   Parainfluenza Virus 3 NOT DETECTED NOT DETECTED Final   Parainfluenza Virus 4 NOT DETECTED NOT DETECTED Final   Respiratory Syncytial Virus NOT DETECTED NOT DETECTED Final   Bordetella pertussis NOT DETECTED NOT DETECTED Final   Chlamydophila pneumoniae NOT DETECTED NOT DETECTED Final   Mycoplasma pneumoniae NOT DETECTED NOT DETECTED Final    Comment: Performed at [facility], [place] [address], [place], [place] [address]  Culture, blood (routine x 2)     Status: None (Preliminary result)   Collection Time: 08/27/18  1:22 PM  Result Value Ref Range Status   Specimen Description   Final    BLOOD LEFT ANTECUBITAL Performed at [facility] [address], [place], [place] [address]    Special Requests   Final    BOTTLES DRAWN AEROBIC AND ANAEROBIC Blood Culture adequate  volume Performed at [facility] [address], [place], [place] [address]    Culture   Final    NO GROWTH 2 DAYS Performed at [facility] [address] [place], [place] [address]    Report Status PENDING  Incomplete  Culture, blood (routine x 2)     Status: None (Preliminary result)   Collection Time: 08/27/18  1:27 PM  Result Value Ref Range Status   Specimen Description   Final    BLOOD RIGHT ANTECUBITAL Performed at [facility] [address] [place], [place] [address]    Special Requests   Final    BOTTLES DRAWN AEROBIC AND ANAEROBIC Blood Culture adequate volume Performed at [facility] [address] [place], [place] [address]    Culture   Final    NO GROWTH 2 DAYS Performed at [facility] [address], [place],  [address]    Report Status PENDING  Incomplete     Labs: BNP (last 3 results) No results for input(s): BNP in the last 8760 hours. Basic Metabolic Panel: Recent Labs  Lab 08/25/18 1457 08/26/18 0201 08/26/18 1524 08/29/18 1127  NA 137 138  --  136  K 3.7 3.7  --  3.6  CL 104 104  --  100  CO2 20* 21*  --  25  GLUCOSE 156* 150*  --  172*  BUN 14 11  --  10  CREATININE 0.85 0.81 0.86 0.94  CALCIUM 9.4 9.3  --  8.5*   Liver Function Tests: Recent Labs  Lab 08/25/18 1457  AST 20  ALT 30  ALKPHOS 49  BILITOT 0.4  PROT 8.0  ALBUMIN 4.6   Recent Labs  Lab 08/25/18 1457  LIPASE 29   No results for input(s): AMMONIA in the last 168 hours. CBC: Recent Labs  Lab 08/25/18 1457 08/26/18 0201 08/26/18 1524 08/27/18 0532 08/28/18 0529 08/29/18 0450  WBC 10.6* 18.1* 18.6* 15.4* 7.7 6.8  NEUTROABS 9.7*  --   --  12.5*  --   --   HGB 14.1 14.3 13.7 13.3 12.3* 12.6*  HCT 41.3 42.9 42.1 39.8 37.8* 38.9*  MCV 90.6 92.3 92.5 89.2 92.2 91.7  PLT 289 251 279 237 225 236   Cardiac Enzymes: Recent Labs  Lab 08/25/18 1457 08/25/18 1830 08/26/18 0201   TROPONINI <  0.03 <0.03 <0.03   BNP: Invalid input(s): POCBNP CBG: Recent Labs  Lab 08/28/18 1704 08/28/18 2123 08/29/18 0744 08/29/18 1143 08/29/18 1715  GLUCAP 106* 152* 167* 175* 140*   D-Dimer No results for input(s): DDIMER in the last 72 hours. Hgb A1c No results for input(s): HGBA1C in the last 72 hours. Lipid Profile No results for input(s): CHOL, HDL, LDLCALC, TRIG, CHOLHDL, LDLDIRECT in the last 72 hours. Thyroid function studies No results for input(s): TSH, T4TOTAL, T3FREE, THYROIDAB in the last 72 hours.  Invalid input(s): FREET3 Anemia work up No results for input(s): VITAMINB12, FOLATE, FERRITIN, TIBC, IRON, RETICCTPCT in the last 72 hours. Urinalysis    Component Value Date/Time   COLORURINE YELLOW 10/09/2011 [place] 10/09/2011 1818   LABSPEC 1.020 03/18/2013 2010   PHURINE 7.0 03/18/2013 2010   GLUCOSEU NEGATIVE 03/18/2013 2010   HGBUR NEGATIVE 03/18/2013 2010   HGBUR negative 02/28/2010 0822   BILIRUBINUR negative 07/28/2018 [place] 03/18/2013 2010   PROTEINUR Positive (A) 07/28/2018 1127   PROTEINUR NEGATIVE 03/18/2013 2010   UROBILINOGEN 0.2 07/28/2018 1127   UROBILINOGEN 0.2 03/18/2013 2010   NITRITE negatitve 07/28/2018 1127   NITRITE NEGATIVE 03/18/2013 2010   LEUKOCYTESUR Negative 07/28/2018 1127   Sepsis Labs Invalid input(s): PROCALCITONIN,  WBC,  LACTICIDVEN Microbiology Recent Results (from the past 240 hour(s))  SARS Coronavirus 2 [facility] order, Performed in [place] hospital lab)     Status: None   Collection Time: 08/25/18  3:32 PM  Result Value Ref Range Status   SARS Coronavirus 2 NEGATIVE NEGATIVE Final    Comment: (NOTE) If result is NEGATIVE SARS-CoV-2 target nucleic acids are NOT DETECTED. The SARS-CoV-2 RNA is generally detectable in upper and lower  respiratory specimens during the acute phase of infection. The lowest  concentration of SARS-CoV-2 viral copies this assay can  detect is 250  copies / mL. A negative result does not preclude SARS-CoV-2 infection  and should not be used as the sole basis for treatment or other  patient management decisions.  A negative result may occur with  improper specimen collection / handling, submission of specimen other  than nasopharyngeal swab, presence of viral mutation(s) within the  areas targeted by this assay, and inadequate number of viral copies  (<250 copies / mL). A negative result must be combined with clinical  observations, patient history, and epidemiological information. If result is POSITIVE SARS-CoV-2 target nucleic acids are DETECTED. The SARS-CoV-2 RNA is generally detectable in upper and lower  respiratory specimens dur ing the acute phase of infection.  Positive  results are indicative of active infection with SARS-CoV-2.  Clinical  correlation with patient history and other diagnostic information is  necessary to determine patient infection status.  Positive results do  not rule out bacterial infection or co-infection with other viruses. If result is PRESUMPTIVE POSTIVE SARS-CoV-2 nucleic acids MAY BE PRESENT.   A presumptive positive result was obtained on the submitted specimen  and confirmed on repeat testing.  While 2019 novel coronavirus  (SARS-CoV-2) nucleic acids may be present in the submitted sample  additional confirmatory testing may be necessary for epidemiological  and / or clinical management purposes  to differentiate between  SARS-CoV-2 and other Sarbecovirus currently known to infect humans.  If clinically indicated additional testing with an alternate test  methodology [phone]) is advised. The SARS-CoV-2 RNA is generally  detectable in upper and lower respiratory sp ecimens during the acute  phase of infection. The  expected result is Negative. Fact Sheet for Patients:  StrictlyIdeas.no Fact Sheet for Healthcare  Providers: BankingDealers.co.za This test is not yet approved or cleared by the [place] FDA and has been authorized for detection and/or diagnosis of SARS-CoV-2 by FDA under an Emergency Use Authorization (EUA).  This EUA will remain in effect (meaning this test can be used) for the duration of the COVID-19 declaration under Section 564(b)(1) of the Act, 21 U.S.C. section 360bbb-3(b)(1), unless the authorization is terminated or revoked sooner. Performed at [facility], [place] [address] [place], [place] [address]   Respiratory Panel by PCR     Status: None   Collection Time: 08/26/18  8:08 AM  Result Value Ref Range Status   Adenovirus NOT DETECTED NOT DETECTED Final   Coronavirus 229E NOT DETECTED NOT DETECTED Final    Comment: (NOTE) The Coronavirus on the Respiratory Panel, DOES NOT test for the novel  Coronavirus (2019 nCoV)    Coronavirus HKU1 NOT DETECTED NOT DETECTED Final   Coronavirus NL63 NOT DETECTED NOT DETECTED Final   Coronavirus OC43 NOT DETECTED NOT DETECTED Final   Metapneumovirus NOT DETECTED NOT DETECTED Final   Rhinovirus / Enterovirus NOT DETECTED NOT DETECTED Final   Influenza A NOT DETECTED NOT DETECTED Final   Influenza B NOT DETECTED NOT DETECTED Final   Parainfluenza Virus 1 NOT DETECTED NOT DETECTED Final   Parainfluenza Virus 2 NOT DETECTED NOT DETECTED Final   Parainfluenza Virus 3 NOT DETECTED NOT DETECTED Final   Parainfluenza Virus 4 NOT DETECTED NOT DETECTED Final   Respiratory Syncytial Virus NOT DETECTED NOT DETECTED Final   Bordetella pertussis NOT DETECTED NOT DETECTED Final   Chlamydophila pneumoniae NOT DETECTED NOT DETECTED Final   Mycoplasma pneumoniae NOT DETECTED NOT DETECTED Final    Comment: Performed at [facility], [place]. [address] [place], [place] [address]  Culture, blood (routine x 2)     Status: None (Preliminary result)   Collection Time: 08/27/18  1:22 PM  Result Value Ref  Range Status   Specimen Description   Final    BLOOD LEFT ANTECUBITAL Performed at [place] [address] [place], [place] [address]    Special Requests   Final    BOTTLES DRAWN AEROBIC AND ANAEROBIC Blood Culture adequate volume Performed at [place] [address] [place], [place] [address]    Culture   Final    NO GROWTH 2 DAYS Performed at [place] [address], [place], [place] [address]    Report Status PENDING  Incomplete  Culture, blood (routine x 2)     Status: None (Preliminary result)   Collection Time: 08/27/18  1:27 PM  Result Value Ref Range Status   Specimen Description   Final    BLOOD RIGHT ANTECUBITAL Performed at [place] [address], [place], [place] [address]    Special Requests   Final    BOTTLES DRAWN AEROBIC AND ANAEROBIC Blood Culture adequate volume Performed at [place] [address] [place], [place] [address]    Culture   Final    NO GROWTH 2 DAYS Performed at [place] [address] [place],  [address]    Report Status PENDING  Incomplete     Time coordinating discharge: 25 minutes  SIGNED:   Antonieta Pert, MD  [facility] 08/29/2018, 5:28 PM  If 7PM-7AM, please contact night-coverage [email or web address]

## 2018-08-29 NOTE — Discharge Instructions (Signed)
Radial Site Care ° °This sheet gives you information about how to care for yourself after your procedure. Your health care provider may also give you more specific instructions. If you have problems or questions, contact your health care provider. °What can I expect after the procedure? °After the procedure, it is common to have: °· Bruising and tenderness at the catheter insertion area. °Follow these instructions at home: °Medicines °· Take over-the-counter and prescription medicines only as told by your health care provider. °Insertion site care °· Follow instructions from your health care provider about how to take care of your insertion site. Make sure you: °? Wash your hands with soap and water before you change your bandage (dressing). If soap and water are not available, use hand sanitizer. °? Change your dressing as told by your health care provider. °? Leave stitches (sutures), skin glue, or adhesive strips in place. These skin closures may need to stay in place for 2 weeks or longer. If adhesive strip edges start to loosen and curl up, you may trim the loose edges. Do not remove adhesive strips completely unless your health care provider tells you to do that. °· Check your insertion site every day for signs of infection. Check for: °? Redness, swelling, or pain. °? Fluid or blood. °? Pus or a bad smell. °? Warmth. °· Do not take baths, swim, or use a hot tub until your health care provider approves. °· You may shower 24-48 hours after the procedure, or as directed by your health care provider. °? Remove the dressing and gently wash the site with plain soap and water. °? Pat the area dry with a clean towel. °? Do not rub the site. That could cause bleeding. °· Do not apply powder or lotion to the site. °Activity ° °· For 24 hours after the procedure, or as directed by your health care provider: °? Do not flex or bend the affected arm. °? Do not push or pull heavy objects with the affected arm. °? Do not  drive yourself home from the hospital or clinic. You may drive 24 hours after the procedure unless your health care provider tells you not to. °? Do not operate machinery or power tools. °· Do not lift anything that is heavier than 10 lb (4.5 kg), or the limit that you are told, until your health care provider says that it is safe. °· Ask your health care provider when it is okay to: °? Return to work or school. °? Resume usual physical activities or sports. °? Resume sexual activity. °General instructions °· If the catheter site starts to bleed, raise your arm and put firm pressure on the site. If the bleeding does not stop, get help right away. This is a medical emergency. °· If you went home on the same day as your procedure, a responsible adult should be with you for the first 24 hours after you arrive home. °· Keep all follow-up visits as told by your health care provider. This is important. °Contact a health care provider if: °· You have a fever. °· You have redness, swelling, or yellow drainage around your insertion site. °Get help right away if: °· You have unusual pain at the radial site. °· The catheter insertion area swells very fast. °· The insertion area is bleeding, and the bleeding does not stop when you hold steady pressure on the area. °· Your arm or hand becomes pale, cool, tingly, or numb. °These symptoms may represent a serious problem   that is an emergency. Do not wait to see if the symptoms will go away. Get medical help right away. Call your local emergency services (911 in the [place] Do not drive yourself to the hospital. °Summary °· After the procedure, it is common to have bruising and tenderness at the site. °· Follow instructions from your health care provider about how to take care of your radial site wound. Check the wound every day for signs of infection. °· Do not lift anything that is heavier than 10 lb (4.5 kg), or the limit that you are told, until your health care provider says  that it is safe. °This information is not intended to replace advice given to you by your health care provider. Make sure you discuss any questions you have with your health care provider. °Document Released: 05/12/2010 Document Revised: 05/15/2017 Document Reviewed: 05/15/2017 °Elsevier Interactive Patient Education © 2019 Elsevier Inc. ° ° ° °Moderate Conscious Sedation, Adult, Care After °These instructions provide you with information about caring for yourself after your procedure. Your health care provider may also give you more specific instructions. Your treatment has been planned according to current medical practices, but problems sometimes occur. Call your health care provider if you have any problems or questions after your procedure. °What can I expect after the procedure? °After your procedure, it is common: °· To feel sleepy for several hours. °· To feel clumsy and have poor balance for several hours. °· To have poor judgment for several hours. °· To vomit if you eat too soon. °Follow these instructions at home: °For at least 24 hours after the procedure: ° °· Do not: °? Participate in activities where you could fall or become injured. °? Drive. °? Use heavy machinery. °? Drink alcohol. °? Take sleeping pills or medicines that cause drowsiness. °? Make important decisions or sign legal documents. °? Take care of children on your own. °· Rest. °Eating and drinking °· Follow the diet recommended by your health care provider. °· If you vomit: °? Drink water, juice, or soup when you can drink without vomiting. °? Make sure you have little or no nausea before eating solid foods. °General instructions °· Have a responsible adult stay with you until you are awake and alert. °· Take over-the-counter and prescription medicines only as told by your health care provider. °· If you smoke, do not smoke without supervision. °· Keep all follow-up visits as told by your health care provider. This is  important. °Contact a health care provider if: °· You keep feeling nauseous or you keep vomiting. °· You feel light-headed. °· You develop a rash. °· You have a fever. °Get help right away if: °· You have trouble breathing. °This information is not intended to replace advice given to you by your health care provider. Make sure you discuss any questions you have with your health care provider. °Document Released: 01/28/2013 Document Revised: 09/12/2015 Document Reviewed: 07/30/2015 °Elsevier Interactive Patient Education © 2019 Elsevier Inc. ° °

## 2018-08-29 NOTE — Interval H&P Note (Signed)
Cath Lab Visit (complete for each Cath Lab visit)  Clinical Evaluation Leading to the Procedure:   ACS: Yes.    Non-ACS:    Anginal Classification: CCS IV  Anti-ischemic medical therapy: Minimal Therapy (1 class of medications)  Non-Invasive Test Results: Intermediate-risk stress test findings: cardiac mortality 1-3%/year  Prior CABG: No previous CABG      History and Physical Interval Note:  08/29/2018 4:06 PM  [name]  has presented today for surgery, with the diagnosis of Abnormal stress test.  The various methods of treatment have been discussed with the patient and family. After consideration of risks, benefits and other options for treatment, the patient has consented to  Procedure(s): LEFT HEART CATH AND CORONARY ANGIOGRAPHY (N/A) as a surgical intervention.  The patient's history has been reviewed, patient examined, no change in status, stable for surgery.  I have reviewed the patient's chart and labs.  Questions were answered to the patient's satisfaction.     [name]

## 2018-09-01 ENCOUNTER — Telehealth: Payer: Self-pay

## 2018-09-01 ENCOUNTER — Encounter (HOSPITAL_COMMUNITY): Payer: Self-pay | Admitting: Interventional Cardiology

## 2018-09-01 LAB — CULTURE, BLOOD (ROUTINE X 2)
Culture: NO GROWTH
Culture: NO GROWTH
Special Requests: ADEQUATE
Special Requests: ADEQUATE

## 2018-09-01 NOTE — Telephone Encounter (Signed)
Attempted to reach patient to complete TCM and scheduled hospital f/u with PCP. Attempt unsuccessful. Left message with contact info on primary phone.

## 2018-09-02 NOTE — Telephone Encounter (Signed)
Recently discharged from [facility] with the following diagnoses:  Discharge Diagnoses:  Principal Problem:   Chest pain Active Problems: Diabetes mellitus type 2, uncontrolled, with complications ([place]) HYPERCHOLESTEROLEMIA, PURE Essential hypertension Abnormal stress test  Per discharge summary:   Follow-Ups: Follow up with Ria Bush, MD (Family Medicine) in 1 week (09/05/2018)   Recommendations for Outpatient Follow-up:  1. Follow up with PCP in 1-2 weeks 2. Please obtain BMP/CBC in one week   Transitional Care Management Follow-up Telephone Call    Date discharged? 08/29/2018  How have you been since you were released from the hospital? [place].   Any patient concerns? None at this time.    Items Reviewed:  Medications reviewed: Yes  Allergies reviewed: Yes  Dietary changes reviewed: Yes, cardiac, diabetic  Referrals reviewed: N/A   Functional Questionnaire:  Independent - I Dependent - D    Activities of Daily Living (ADLs):    Personal hygiene - I Dressing - I Eating - I Maintaining continence - I Transferring - I   Independent Activities of Daily Living (iADLs): Basic communication skills - I Transportation - I Meal preparation  - I Shopping - I Housework - I Managing medications - I  Managing personal finances - I   Confirmed importance and date/time of follow-up visits scheduled YES  Provider Appointment booked with PCP 09/03/18 @ 1100  Confirmed with patient if condition begins to worsen call PCP or go to the ER.  Patient was given the office number and encouraged to call back with question or concerns: YES

## 2018-09-03 ENCOUNTER — Telehealth: Payer: Self-pay | Admitting: *Deleted

## 2018-09-03 ENCOUNTER — Ambulatory Visit (INDEPENDENT_AMBULATORY_CARE_PROVIDER_SITE_OTHER): Payer: Medicare Other | Admitting: Family Medicine

## 2018-09-03 ENCOUNTER — Encounter: Payer: Self-pay | Admitting: Family Medicine

## 2018-09-03 VITALS — BP 134/71 | HR 69 | Ht 68.0 in

## 2018-09-03 DIAGNOSIS — R079 Chest pain, unspecified: Secondary | ICD-10-CM | POA: Diagnosis not present

## 2018-09-03 DIAGNOSIS — I2510 Atherosclerotic heart disease of native coronary artery without angina pectoris: Secondary | ICD-10-CM

## 2018-09-03 DIAGNOSIS — K8689 Other specified diseases of pancreas: Secondary | ICD-10-CM | POA: Diagnosis not present

## 2018-09-03 DIAGNOSIS — I10 Essential (primary) hypertension: Secondary | ICD-10-CM

## 2018-09-03 DIAGNOSIS — IMO0002 Reserved for concepts with insufficient information to code with codable children: Secondary | ICD-10-CM

## 2018-09-03 DIAGNOSIS — E1165 Type 2 diabetes mellitus with hyperglycemia: Secondary | ICD-10-CM

## 2018-09-03 DIAGNOSIS — E118 Type 2 diabetes mellitus with unspecified complications: Secondary | ICD-10-CM

## 2018-09-03 DIAGNOSIS — E7800 Pure hypercholesterolemia, unspecified: Secondary | ICD-10-CM | POA: Diagnosis not present

## 2018-09-03 DIAGNOSIS — K148 Other diseases of tongue: Secondary | ICD-10-CM

## 2018-09-03 HISTORY — DX: Atherosclerotic heart disease of native coronary artery without angina pectoris: I25.10

## 2018-09-03 MED ORDER — ATORVASTATIN CALCIUM 80 MG PO TABS: 80.0000 mg | ORAL_TABLET | Freq: Every day | ORAL | 1 refills | Status: DC

## 2018-09-03 MED ORDER — CARVEDILOL 3.125 MG PO TABS: 3.1250 mg | ORAL_TABLET | Freq: Two times a day (BID) | ORAL | 1 refills | Status: DC

## 2018-09-03 NOTE — Assessment & Plan Note (Signed)
Reviewed workup in hospital including reassuring catheterization (nonobstructive disease). Reviewed optimizing modifiable risk factors (HTN, DM, HLD).

## 2018-09-03 NOTE — Progress Notes (Signed)
Virtual visit completed through [employer]. Due to national recommendations of social distancing due to [place] 19, a virtual visit is felt to be most appropriate for this patient at this time. Interactive audio and video telecommunications were attempted between myself and [employer], however failed as patient did not have access to video capability.  We continued and completed visit with audio only.   Patient location: home Provider location: [place] [facility], office If any vitals were documented, they were collected by patient at home unless specified below.    BP 134/71 Comment: checked at home yesterday  Pulse 69   Ht 5\' 8"  (1.727 m)   BMI 34.76 kg/m    CC: hosp f/u visit Subjective:    Patient ID: [name], male    DOB: 12-Nov-1950, 68 y.o.   MRN: [id number]  HPI: [name] is a 68 y.o. male presenting on 09/03/2018 for Hospitalization Follow-up   Recent hospitalization for chest pain, records reviewed. Admitted with new onset dyspnea on exertion and abdominal/chest pain and arm paresthesias. EKG showed new T wave inversions anterolaterally. Troponin was negative. Pt started on heparin drip with improvement in symptoms. CXR negative, Covid19 testing negative as well as influenza and respiratory viral penl. D dimer was elevated, CTA chest was reassuring. He had abnormal nuclear stress test showing possible apical ischemia, subsequent cardiac catheterization as below.   Pt was appreciative of phone call during hospitalization.   Blood cultures negative x2 (final).  Echocardiogram with EF 55-60%, impaired diastolic relaxation, trivial AV regurg without stenosis, mildly dilated ascending aorta.  Cardiac catheterization ([facility]) mid LAD 25% stenosed, severe R subclavian tortuosity, EF 55-60%, nonobstructive CAD.   Atorvastatin increased to 80mg  daily, started on carvedilol 3.125mg  bid. Baclofen also started for back spasm.   Had dental work done yesterday -  attributed this caused sore on floor of mouth.   Admit date: 08/25/2018 Discharge date: 08/29/2018 TCM hosp f/u phone call completed 09/01/2018  Admitted From: home Disposition:  home  Recommendations for Outpatient Follow-up:  1. Follow up with PCP in 1-2 weeks 2. Please obtain BMP/CBC in one week 3. Please follow up on the following pending results:  Home Health:none  Equipment/Devices:none Discharge Condition:stable  CODE STATUS:full  Diet recommendation: cardiac,diabetic  Consultants:  Cardiology  Procedures: Stress test-abnormal Cardiac cath- clean  Discharge Diagnoses:  Principal Problem:   Chest pain Active Problems:   Diabetes mellitus type 2, uncontrolled, with complications (HCC)   HYPERCHOLESTEROLEMIA, PURE   Essential hypertension   Abnormal stress test     Relevant past medical, surgical, family and social history reviewed and updated as indicated. Interim medical history since our last visit reviewed. Allergies and medications reviewed and updated. Outpatient Medications Prior to Visit  Medication Sig Dispense Refill  . aspirin EC 81 MG tablet Take 81 mg by mouth daily.    . baclofen (LIORESAL) 10 MG tablet TAKE 1 TABLET BY MOUTH AT  BEDTIME AS NEEDED FOR BACK  SPASMS (Patient taking differently: Take 10 mg by mouth at bedtime as needed for muscle spasms. ) 90 tablet 1  . chlorhexidine (PERIDEX) 0.12 % solution Use as directed 15 mLs in the mouth or throat 2 (two) times daily. 120 mL 0  . empagliflozin (JARDIANCE) 10 MG TABS tablet Take 10 mg by mouth daily. 90 tablet 2  . fluticasone (FLONASE) 50 MCG/ACT nasal spray USE 2 SPRAYS IN EACH  NOSTRIL DAILY (Patient taking differently: Place 2 sprays into both nostrils daily. ) 48  g 3  . gabapentin (NEURONTIN) 100 MG capsule TAKE 1 CAPSULE BY MOUTH TWO TIMES DAILY (Patient taking differently: Take 100 mg by mouth 2 (two) times daily. ) 180 capsule 1  . glipiZIDE (GLUCOTROL XL) 10 MG 24 hr tablet TAKE 1 TABLET  BY MOUTH  DAILY WITH BREAKFAST (Patient taking differently: Take 10 mg by mouth daily with breakfast. ) 90 tablet 2  . glucose blood (ONE TOUCH ULTRA TEST) test strip USE WITH METER TO CHECK  SUGAR TWICE DAILY. 200 each 4  . losartan (COZAAR) 50 MG tablet TAKE 1 TABLET BY MOUTH  DAILY (Patient taking differently: Take 50 mg by mouth daily. ) 90 tablet 0  . metFORMIN (GLUCOPHAGE) 1000 MG tablet Take 1 tablet (1,000 mg total) by mouth 2 (two) times daily with a meal.    . oxyCODONE-acetaminophen (PERCOCET) 10-325 MG tablet Take 1 tablet by mouth every 4 (four) hours as needed for pain.     Marland Kitchen triamcinolone (KENALOG) 0.1 % paste Use as directed 1 application in the mouth or throat 2 (two) times daily. 5 g 1  . atorvastatin (LIPITOR) 80 MG tablet Take 1 tablet (80 mg total) by mouth daily for 30 days. 30 tablet 0  . carvedilol (COREG) 3.125 MG tablet Take 1 tablet (3.125 mg total) by mouth 2 (two) times daily with a meal for 30 days. 60 tablet 0   No facility-administered medications prior to visit.      Per HPI unless specifically indicated in ROS section below Review of Systems Objective:    BP 134/71 Comment: checked at home yesterday  Pulse 69   Ht 5\' 8"  (1.727 m)   BMI 34.76 kg/m   Wt Readings from Last 3 Encounters:  08/26/18 228 lb 9.6 oz (103.7 kg)  08/05/18 237 lb (107.5 kg)  07/28/18 241 lb 3 oz (109.4 kg)     Physical exam: Gen: alert, NAD, not ill appearing Pulm: speaks in complete sentences without increased work of breathing Psych: normal mood, normal thought content      Lab Results  Component Value Date   HGBA1C 9.2 (H) 07/28/2018    Lab Results  Component Value Date   CHOL 148 03/04/2017   HDL 35.20 (L) 03/04/2017   LDLCALC 93 03/04/2017   LDLDIRECT 104 (H) 12/26/2012   TRIG 100.0 03/04/2017   CHOLHDL 4 03/04/2017    Assessment & Plan:   Problem List Items Addressed This Visit    Tongue lesion    This is improving after yesterday's dental work.  Dentist  thought related to poor dentition      RESOLVED: Mass of pancreas    Reassuring MRI - no pancreatic mass found - will resolve.       HYPERCHOLESTEROLEMIA, PURE    Atorvastatin was increased to 80mg  daily. Continue higher dose.  Nonobstructive CAD. Goal LDL <70.       Relevant Medications   atorvastatin (LIPITOR) 80 MG tablet   carvedilol (COREG) 3.125 MG tablet   Essential hypertension    Chronic, stable. Recently started on carvedilol 3.125mg  bid - refilled today.       Relevant Medications   atorvastatin (LIPITOR) 80 MG tablet   carvedilol (COREG) 3.125 MG tablet   Diabetes mellitus type 2, uncontrolled, with complications (HCC)    Chronic, uncontrolled. He had just recently restarted SGLT2 last month. Will reassess at July appt. Discussed always taking glipizide XL with breakfast.       Relevant Medications   atorvastatin (LIPITOR) 80  MG tablet   Chest pain - Primary    Reviewed workup in hospital including reassuring catheterization (nonobstructive disease). Reviewed optimizing modifiable risk factors (HTN, DM, HLD).       CAD (coronary artery disease)    Nonobstructive 25% plaque in LAD by cath 08/2018.       Relevant Medications   atorvastatin (LIPITOR) 80 MG tablet   carvedilol (COREG) 3.125 MG tablet       Meds ordered this encounter  Medications  . atorvastatin (LIPITOR) 80 MG tablet    Sig: Take 1 tablet (80 mg total) by mouth daily for 30 days.    Dispense:  90 tablet    Refill:  1    Note new sig  . carvedilol (COREG) 3.125 MG tablet    Sig: Take 1 tablet (3.125 mg total) by mouth 2 (two) times daily with a meal for 30 days.    Dispense:  180 tablet    Refill:  1    New medicine   No orders of the defined types were placed in this encounter.   Follow up plan: No follow-ups on file.  Ria Bush, MD

## 2018-09-03 NOTE — Assessment & Plan Note (Signed)
Reassuring MRI - no pancreatic mass found - will resolve.

## 2018-09-03 NOTE — Assessment & Plan Note (Signed)
Chronic, uncontrolled. He had just recently restarted SGLT2 last month. Will reassess at July appt. Discussed always taking glipizide XL with breakfast.

## 2018-09-03 NOTE — Assessment & Plan Note (Signed)
Chronic, stable. Recently started on carvedilol 3.125mg  bid - refilled today.

## 2018-09-03 NOTE — Assessment & Plan Note (Addendum)
Nonobstructive 25% plaque in LAD by cath 08/2018.

## 2018-09-03 NOTE — Assessment & Plan Note (Addendum)
Atorvastatin was increased to 80mg  daily. Continue higher dose.  Nonobstructive CAD. Goal LDL <70.

## 2018-09-03 NOTE — Assessment & Plan Note (Addendum)
This is improving after yesterday's dental work.  Dentist thought related to poor dentition

## 2018-09-03 NOTE — Telephone Encounter (Signed)
Patient is calling to let PCP know that he has found a resource for his [name] and he has received 90 additional tablets. Patient had 180 tablets at this time- he will call before he needs refill. ( Patient states he is very pleased that he has found help and he is also reporting excellent numbers using this medication)

## 2018-09-04 ENCOUNTER — Ambulatory Visit: Payer: Medicare Other | Admitting: Family Medicine

## 2018-09-04 NOTE — Telephone Encounter (Signed)
Great to hear. Thanks.

## 2018-09-08 DIAGNOSIS — N3000 Acute cystitis without hematuria: Secondary | ICD-10-CM | POA: Diagnosis not present

## 2018-09-08 DIAGNOSIS — N471 Phimosis: Secondary | ICD-10-CM | POA: Diagnosis not present

## 2018-09-10 DIAGNOSIS — M5412 Radiculopathy, cervical region: Secondary | ICD-10-CM | POA: Diagnosis not present

## 2018-09-15 ENCOUNTER — Other Ambulatory Visit: Payer: Self-pay | Admitting: Family Medicine

## 2018-09-16 NOTE — Telephone Encounter (Signed)
Naproxen Discontinued:  08/14/18, #120 Last OV:  09/03/18, acute Next OV:  CPE Pt 2

## 2018-09-17 ENCOUNTER — Other Ambulatory Visit: Payer: Self-pay | Admitting: Pharmacy Technician

## 2018-09-17 NOTE — Patient Outreach (Signed)
[place] [facility]) Care Management  09/17/2018  [name] 07-26-1950 [id number]    Unsuccessful call #1 placed to patient regarding patient assistance medication receipt from [place] , unable to leave voicemail.   Follow up:  Will make 2nd call attempt in 2-3 business days to verify patient has received Jardiance from [employer] patient assistance.  Maud Deed Chana Bode Dixon Certified Pharmacy Technician [facility] Management Direct Dial:[phone]

## 2018-10-02 ENCOUNTER — Other Ambulatory Visit: Payer: Self-pay | Admitting: Pharmacist

## 2018-10-02 DIAGNOSIS — R300 Dysuria: Secondary | ICD-10-CM | POA: Diagnosis not present

## 2018-10-02 NOTE — Patient Outreach (Signed)
[name]) Care Management  [name] 10/02/2018  [name] 1950-10-03 [id number]  Patient successful approved for Jardiance through [place] Patient assistance program through 04/23/2019 and has received medication.  Patient reports he is feeling much better and CBGs are very well controlled.  He is very appreciative of [facility] services and will contact us if he needs help with re-applying for 2021.   Plan: Will close [facility] pharmacy case at this time.  Will update [facility] pharmacy technician.   Ralene Bathe, PharmD, [place] [phone]

## 2018-10-11 ENCOUNTER — Other Ambulatory Visit: Payer: Self-pay | Admitting: Family Medicine

## 2018-10-15 ENCOUNTER — Ambulatory Visit: Payer: Medicare Other | Admitting: Podiatry

## 2018-10-15 ENCOUNTER — Other Ambulatory Visit: Payer: Self-pay | Admitting: Family Medicine

## 2018-10-15 ENCOUNTER — Other Ambulatory Visit: Payer: Self-pay

## 2018-10-15 ENCOUNTER — Encounter: Payer: Self-pay | Admitting: Podiatry

## 2018-10-15 DIAGNOSIS — M79674 Pain in right toe(s): Secondary | ICD-10-CM | POA: Diagnosis not present

## 2018-10-15 DIAGNOSIS — E1165 Type 2 diabetes mellitus with hyperglycemia: Secondary | ICD-10-CM

## 2018-10-15 DIAGNOSIS — M79675 Pain in left toe(s): Secondary | ICD-10-CM | POA: Diagnosis not present

## 2018-10-15 DIAGNOSIS — C61 Malignant neoplasm of prostate: Secondary | ICD-10-CM

## 2018-10-15 DIAGNOSIS — E1142 Type 2 diabetes mellitus with diabetic polyneuropathy: Secondary | ICD-10-CM

## 2018-10-15 DIAGNOSIS — IMO0002 Reserved for concepts with insufficient information to code with codable children: Secondary | ICD-10-CM

## 2018-10-15 DIAGNOSIS — B351 Tinea unguium: Secondary | ICD-10-CM

## 2018-10-15 DIAGNOSIS — D509 Iron deficiency anemia, unspecified: Secondary | ICD-10-CM

## 2018-10-15 DIAGNOSIS — E7800 Pure hypercholesterolemia, unspecified: Secondary | ICD-10-CM

## 2018-10-15 HISTORY — DX: Tinea unguium: B35.1

## 2018-10-15 HISTORY — DX: Pain in right toe(s): M79.675

## 2018-10-15 HISTORY — DX: Pain in right toe(s): M79.674

## 2018-10-15 NOTE — Progress Notes (Signed)
Complaint:  °Visit Type: Patient returns to my office for continued preventative foot care services. Complaint: Patient states" my nails have grown long and thick and become painful to walk and wear shoes" Patient has been diagnosed with DM with foot neuropathy. The patient presents for preventative foot care services. No changes to ROS ° °Podiatric Exam: °Vascular: dorsalis pedis and posterior tibial pulses are palpable bilateral. Capillary return is immediate. Temperature gradient is WNL. Skin turgor WNL  °Sensorium: Normal Semmes Weinstein monofilament test. Normal tactile sensation bilaterally. °Nail Exam: Pt has thick disfigured discolored nails with subungual debris noted bilateral entire nail hallux through fifth toenails °Ulcer Exam: There is no evidence of ulcer or pre-ulcerative changes or infection. °Orthopedic Exam: Muscle tone and strength are WNL. No limitations in general ROM. No crepitus or effusions noted. Foot type and digits show no abnormalities. Bony prominences are unremarkable. °Skin: No Porokeratosis. No infection or ulcers ° °Diagnosis:  °Onychomycosis, , Pain in right toe, pain in left toes ° °Treatment & Plan °Procedures and Treatment: Consent by patient was obtained for treatment procedures.   Debridement of mycotic and hypertrophic toenails, 1 through 5 bilateral and clearing of subungual debris. No ulceration, no infection noted.  °Return Visit-Office Procedure: Patient instructed to return to the office for a follow up visit 4 months   for continued evaluation and treatment. ° ° ° °Antwan Bribiesca DPM °

## 2018-10-16 ENCOUNTER — Ambulatory Visit (INDEPENDENT_AMBULATORY_CARE_PROVIDER_SITE_OTHER): Payer: Medicare Other

## 2018-10-16 ENCOUNTER — Other Ambulatory Visit (INDEPENDENT_AMBULATORY_CARE_PROVIDER_SITE_OTHER): Payer: Medicare Other

## 2018-10-16 DIAGNOSIS — E118 Type 2 diabetes mellitus with unspecified complications: Secondary | ICD-10-CM

## 2018-10-16 DIAGNOSIS — D509 Iron deficiency anemia, unspecified: Secondary | ICD-10-CM | POA: Diagnosis not present

## 2018-10-16 DIAGNOSIS — IMO0002 Reserved for concepts with insufficient information to code with codable children: Secondary | ICD-10-CM

## 2018-10-16 DIAGNOSIS — E1165 Type 2 diabetes mellitus with hyperglycemia: Secondary | ICD-10-CM | POA: Diagnosis not present

## 2018-10-16 DIAGNOSIS — Z0000 Encounter for general adult medical examination without abnormal findings: Secondary | ICD-10-CM

## 2018-10-16 DIAGNOSIS — E7800 Pure hypercholesterolemia, unspecified: Secondary | ICD-10-CM | POA: Diagnosis not present

## 2018-10-16 DIAGNOSIS — C61 Malignant neoplasm of prostate: Secondary | ICD-10-CM | POA: Diagnosis not present

## 2018-10-16 LAB — CBC WITH DIFFERENTIAL/PLATELET
Basophils Absolute: 0 10*3/uL (ref 0.0–0.1)
Basophils Relative: 0.4 % (ref 0.0–3.0)
Eosinophils Absolute: 0.1 10*3/uL (ref 0.0–0.7)
Eosinophils Relative: 2.9 % (ref 0.0–5.0)
HCT: 38.6 % — ABNORMAL LOW (ref 39.0–52.0)
Hemoglobin: 12.8 g/dL — ABNORMAL LOW (ref 13.0–17.0)
Lymphocytes Relative: 32.1 % (ref 12.0–46.0)
Lymphs Abs: 1.6 10*3/uL (ref 0.7–4.0)
MCHC: 33.2 g/dL (ref 30.0–36.0)
MCV: 90.9 fl (ref 78.0–100.0)
Monocytes Absolute: 0.6 10*3/uL (ref 0.1–1.0)
Monocytes Relative: 11.1 % (ref 3.0–12.0)
Neutro Abs: 2.7 10*3/uL (ref 1.4–7.7)
Neutrophils Relative %: 53.5 % (ref 43.0–77.0)
Platelets: 298 10*3/uL (ref 150.0–400.0)
RBC: 4.25 Mil/uL (ref 4.22–5.81)
RDW: 15.1 % (ref 11.5–15.5)
WBC: 5 10*3/uL (ref 4.0–10.5)

## 2018-10-16 LAB — COMPREHENSIVE METABOLIC PANEL
ALT: 23 U/L (ref 0–53)
AST: 15 U/L (ref 0–37)
Albumin: 4.1 g/dL (ref 3.5–5.2)
Alkaline Phosphatase: 41 U/L (ref 39–117)
BUN: 11 mg/dL (ref 6–23)
CO2: 26 mEq/L (ref 19–32)
Calcium: 9.2 mg/dL (ref 8.4–10.5)
Chloride: 105 mEq/L (ref 96–112)
Creatinine, Ser: 0.75 mg/dL (ref 0.40–1.50)
GFR: 125.07 mL/min (ref >60.00)
Glucose, Bld: 114 mg/dL — ABNORMAL HIGH (ref 70–99)
Potassium: 4.5 mEq/L (ref 3.5–5.1)
Sodium: 139 mEq/L (ref 135–145)
Total Bilirubin: 0.2 mg/dL (ref 0.2–1.2)
Total Protein: 6.9 g/dL (ref 6.0–8.3)

## 2018-10-16 LAB — LIPID PANEL
Cholesterol: 95 mg/dL (ref 0–200)
HDL: 35.5 mg/dL — ABNORMAL LOW (ref >39.00)
LDL Cholesterol: 45 mg/dL (ref 0–99)
NonHDL: 59.46
Total CHOL/HDL Ratio: 3
Triglycerides: 70 mg/dL (ref 0.0–149.0)
VLDL: 14 mg/dL (ref 0.0–40.0)

## 2018-10-16 LAB — IBC PANEL
Iron: 81 ug/dL (ref 42–165)
Saturation Ratios: 22.3 % (ref 20.0–50.0)
Transferrin: 260 mg/dL (ref 212.0–360.0)

## 2018-10-16 LAB — PSA: PSA: 2 ng/mL (ref 0.10–4.00)

## 2018-10-16 LAB — FERRITIN: Ferritin: 33.5 ng/mL (ref 22.0–322.0)

## 2018-10-16 NOTE — Progress Notes (Signed)
PCP notes:   Health maintenance:  PPSV23 - to be determined Eye exam - to be determined  Abnormal screenings:   None  Patient concerns:   None  Nurse concerns:  None  Next PCP appt:   10/28/18 @ 1030

## 2018-10-16 NOTE — Progress Notes (Signed)
Subjective:   [name] is a 68 y.o. male who presents for Medicare Annual/Subsequent preventive examination.  Review of Systems:  N/A Cardiac Risk Factors include: advanced age (>83men, >58 women);diabetes mellitus;dyslipidemia;hypertension;male gender     Objective:    Vitals: There were no vitals taken for this visit.  There is no height or weight on file to calculate BMI.  Advanced Directives 10/16/2018 08/25/2018 08/13/2018 08/09/2018 03/04/2017 08/29/2016 08/21/2016  Does Patient Have a Medical Advance Directive? Yes No No No Yes No No  Type of Paramedic of [place];Living will - - - [place];Living will - -  Does patient want to make changes to medical advance directive? No - Patient declined No - Patient declined No - Patient declined - - - -  Copy of [place] in Chart? No - copy requested - - - No - copy requested - -  Would patient like information on creating a medical advance directive? - No - Patient declined No - Patient declined - - No - Patient declined -  Pre-existing out of facility DNR order (yellow form or pink MOST form) - - - - - - -    Tobacco Social History   Tobacco Use  Smoking Status Never Smoker  Smokeless Tobacco Never Used     Counseling given: No   Clinical Intake:  Pre-visit preparation completed: Yes  Pain : No/denies pain Pain Score: 0-No pain     Nutritional Status: BMI > 30  Obese Nutritional Risks: None  How often do you need to have someone help you when you read instructions, pamphlets, or other written materials from your doctor or pharmacy?: 1 - Never What is the last grade level you completed in school?: 12th grade  Interpreter Needed?: No  Comments: pt lives independently Information entered by :: LPinson, RN  Past Medical History:  Diagnosis Date  . Abdominal wall hernia   . Anxiety   . Back pain    with radiculopathy  . Bladder wall thickening 2013    nl rpt CT scan, normal cytology and UCx, released from urology Karsten Ro)  . Chest pain, non-cardiac 2008   adenosine myoview low risk.  EF 60%.  normal cath 2008, deemed atypical /noncardiac, ?discogenic  . Chronic neck pain   . Chronic pain    narcotic dependent ([place])  . Circadian rhythm sleep disorder, irregular sleep-wake type   . Complication of anesthesia    hard to put patient to sleep  . Depression   . Diverticulosis 2013   by CT scan  . Esophageal stricture 06/2007   dilation of stricture  . GERD (gastroesophageal reflux disease)   . Gross hematuria 2013, 2016   negative CT, cystoscopy, cytology, 2016 - thought from E coli UTI s/p appropriate treatment ([place])  . History of cocaine use 2004  . History of epididymitis   . History of prostatitis 08/2011   ?chronic/[place], treated with 1 mo cipro  . Hyperlipidemia   . Hypertension    Essential  . OSA (obstructive sleep apnea) 2010   mild off CPAP  . Postlaminectomy syndrome of cervical region    s/p ACDF C5-7 2010 ([place])  . Prostate cancer ([place]) 06/16/2012   T2a, Gleason 6 prostate cancer found by R apical nodule with PSA 2.18 Karsten Ro, [place]) 04/2016  . T2DM (type 2 diabetes mellitus) ([place])   . TIA (transient ischemic attack)    cocaine related   Past Surgical History:  Procedure Laterality  Date  . ANTERIOR CERVICAL DISCECTOMY  10/28/2008   with fusion (Dr. Rolena Infante)  . CARDIAC CATHETERIZATION  2008   WNL   . CARPAL TUNNEL RELEASE     Right  . CATARACT EXTRACTION Bilateral 12/2014, 01/2015   Dr Tommy Rainwater  . COLONOSCOPY  10/11/2011   severe diverticulosis, int hemorrhoids, rec rpt 5 yrs Carlean Purl)  . DIRECT LARYNGOSCOPY  11/02/2008   Extubation under anesthesia (Dr. Erik Obey)  . ESOPHAGOGASTRODUODENOSCOPY  07/17/07   Esoph. Stricture dilated (Dr. Henrene Pastor)  . ESOPHAGOGASTRODUODENOSCOPY  10/11/2011   WNL Carlean Purl)  . EXCISION MASS NECK N/A 08/29/2016   Procedure: EXCISION MASS POSTERIOR NECK;  Surgeon: Erroll Luna, MD;  Location: [facility];  Service: General;  Laterality: N/A;  . HEMATOMA EVACUATION  10/29/2008   (Dr. Rolena Infante)  . INGUINAL HERNIA REPAIR     bilateral, 5 cm. RLQ Abd Lipoma Excision (Dr. Lucia Gaskins)  . LEFT HEART CATH AND CORONARY ANGIOGRAPHY N/A 08/29/2018   Procedure: LEFT HEART CATH AND CORONARY ANGIOGRAPHY;  Surgeon: Jettie Booze, MD;  Location: [facility];  Service: Cardiovascular;  Laterality: N/A;  . LIPOMA EXCISION  08/2016   posterior neck  . SHOULDER ARTHROSCOPY WITH SUBACROMIAL DECOMPRESSION AND OPEN ROTATOR C Left 01/23/2013   Procedure: LEFT SHOULDER ARTHROSCOPY WITH SUBACROMIAL DECOMPRESSION AND MINI OPEN ROTATOR CUFF REPAIR, AND OPEN DISTAL CLAVICLE RESECTION;  Surgeon: Augustin Schooling, MD;  Location: [facility];  Service: Orthopedics;  Laterality: Left;  . SHOULDER SURGERY Right 2013   replaced  . TONSILLECTOMY  childhood  . TRIGGER FINGER RELEASE     Right long finger   Family History  Problem Relation Age of Onset  . Diabetes Mother   . Hypertension Mother   . Hyperlipidemia Mother   . Diabetes Father   . Diabetes Sister   . Hypertension Sister   . Hypertension Brother   . Diabetes Brother   . Diabetes Brother   . Hypertension Brother   . Diabetes Brother   . Heart disease Maternal Uncle        Heart failure  . Cancer Paternal Uncle        Prostate  . Cancer Paternal Grandfather        Prostate  . Stroke Neg Hx    Social History   Socioeconomic History  . Marital status: Single    Spouse name: Not on file  . Number of children: Not on file  . Years of education: Not on file  . Highest education level: Not on file  Occupational History  . Occupation: [name] previously    Fish farm manager: UNEMPLOYED    Comment: Now on disability after neck surgery  Social Needs  . Financial resource strain: Not on file  . Food insecurity    Worry: Not on file    Inability: Not on file  . Transportation needs    Medical: Not on  file    Non-medical: Not on file  Tobacco Use  . Smoking status: Never Smoker  . Smokeless tobacco: Never Used  Substance and Sexual Activity  . Alcohol use: No    Alcohol/week: 0.0 standard drinks  . Drug use: No    Comment: H/O marijuana (last 2005); Cocaine 5/04  . Sexual activity: Not Currently  Lifestyle  . Physical activity    Days per week: Not on file    Minutes per session: Not on file  . Stress: Not on file  Relationships  . Social Herbalist on  phone: Not on file    Gets together: Not on file    Attends religious service: Not on file    Active member of club or organization: Not on file    Attends meetings of clubs or organizations: Not on file    Relationship status: Not on file  Other Topics Concern  . 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   Activity: no regular exercise, wants to start walking   Diet: good water, fruits/vegetables daily.    Outpatient Encounter Medications as of 10/16/2018  Medication Sig  . aspirin EC 81 MG tablet Take 81 mg by mouth daily.  . baclofen (LIORESAL) 10 MG tablet TAKE 1 TABLET BY MOUTH AT  BEDTIME AS NEEDED FOR BACK  SPASMS (Patient taking differently: Take 10 mg by mouth at bedtime as needed for muscle spasms. )  . chlorhexidine (PERIDEX) 0.12 % solution Use as directed 15 mLs in the mouth or throat 2 (two) times daily.  . empagliflozin (JARDIANCE) 10 MG TABS tablet Take 10 mg by mouth daily.  . fluticasone (FLONASE) 50 MCG/ACT nasal spray USE 2 SPRAYS IN EACH  NOSTRIL DAILY (Patient taking differently: Place 2 sprays into both nostrils daily. )  . gabapentin (NEURONTIN) 100 MG capsule TAKE 1 CAPSULE BY MOUTH TWO TIMES DAILY (Patient taking differently: Take 100 mg by mouth 2 (two) times daily. )  . glipiZIDE (GLUCOTROL XL) 10 MG 24 hr tablet  TAKE 1 TABLET BY MOUTH  DAILY WITH BREAKFAST (Patient taking differently: Take 10 mg by mouth daily with breakfast. )  . glucose blood (ONE TOUCH ULTRA TEST) test strip USE WITH METER TO CHECK  SUGAR TWICE DAILY.  Marland Kitchen losartan (COZAAR) 50 MG tablet TAKE 1 TABLET BY MOUTH  DAILY (Patient taking differently: Take 50 mg by mouth daily. )  . metFORMIN (GLUCOPHAGE) 1000 MG tablet TAKE 1 TABLET BY MOUTH  TWICE DAILY WITH MEALS  . oxyCODONE-acetaminophen (PERCOCET) 10-325 MG tablet Take 1 tablet by mouth every 4 (four) hours as needed for pain.   Marland Kitchen triamcinolone (KENALOG) 0.1 % paste Use as directed 1 application in the mouth or throat 2 (two) times daily.  Marland Kitchen atorvastatin (LIPITOR) 80 MG tablet Take 1 tablet (80 mg total) by mouth daily for 30 days.  . carvedilol (COREG) 3.125 MG tablet Take 1 tablet (3.125 mg total) by mouth 2 (two) times daily with a meal for 30 days.   No facility-administered encounter medications on file as of 10/16/2018.     Activities of Daily Living In your present state of health, do you have any difficulty performing the following activities: 10/16/2018 08/25/2018  Hearing? N N  Vision? N N  Difficulty concentrating or making decisions? N N  Walking or climbing stairs? N N  Dressing or bathing? N N  Doing errands, shopping? N N  Preparing Food and eating ? N -  Using the Toilet? N -  In the past six months, have you accidently leaked urine? N -  Do you have problems with loss of bowel control? N -  Managing your Medications? N -  Managing your Finances? N -  Housekeeping or managing your Housekeeping? N -  Some recent data might be hidden    Patient Care Team: Ria Bush, MD as PCP - General (Family Medicine) Tonia Ghent, MD (  Family Medicine) Shirley Muscat, Loreen Freud, MD as Referring Physician (Optometry) Netta Cedars, MD as Consulting Physician (Orthopedic Surgery) Melina Schools, MD as Consulting Physician (Orthopedic Surgery)   Assessment:   This is a  routine wellness examination for Nekhi.   Hearing Screening   125Hz  250Hz  500Hz  1000Hz  2000Hz  3000Hz  4000Hz  6000Hz  8000Hz   Right ear:           Left ear:           Vision Screening Comments: Vision exam in 2019   Exercise Activities and Dietary recommendations Current Exercise Habits: The patient does not participate in regular exercise at present, Exercise limited by: None identified  Goals    . Increase physical activity     Starting 02/02/2016, I will continue to participate in physical therapy as prescribed.     . Increase water intake     Starting 03/04/2017, I will continue to drink at least 6-8 glasses of water daily.        Fall Risk Fall Risk  10/16/2018 03/04/2017 02/02/2016 01/31/2015 01/26/2014  Falls in the past year? 0 No No No No   Depression Screen PHQ 2/9 Scores 10/16/2018 08/13/2018 03/04/2017 02/02/2016  PHQ - 2 Score 0 0 0 0  PHQ- 9 Score 0 - 0 -    Cognitive Function MMSE - Mini Mental State Exam 10/16/2018 03/04/2017 02/02/2016  Orientation to time 5 5 5   Orientation to Place 5 5 5   Registration 3 3 3   Attention/ Calculation 0 0 0  Recall 3 3 3   Language- name 2 objects 0 0 0  Language- repeat 1 1 1   Language- follow 3 step command 0 3 3  Language- read & follow direction 0 0 0  Write a sentence 0 0 0  Copy design 0 0 0  Total score 17 20 20      PLEASE NOTE: A Mini-Cog screen was completed. Maximum score is 17. A value of 0 denotes this part of [place] MMSE was not completed or the patient failed this part of the Mini-Cog screening.   Mini-Cog Screening Orientation to Time - Max 5 pts Orientation to Place - Max 5 pts Registration - Max 3 pts Recall - Max 3 pts Language Repeat - Max 1 pts      Immunization History  Administered Date(s) Administered  . Influenza Split 02/06/2011, 01/08/2012  . Influenza Whole 03/19/2007, 02/19/2008, 01/27/2009, 01/25/2010  . Influenza, High Dose Seasonal PF 03/05/2018  . Influenza,inj,Quad PF,6+ Mos  02/20/2013, 01/26/2014, 01/31/2015, 02/02/2016, 03/04/2017  . Pneumococcal Conjugate-13 08/01/2015  . Pneumococcal Polysaccharide-23 06/16/2012  . Td 04/24/2003  . Tdap 01/26/2014  . Zoster 12/15/2010    Screening Tests Health Maintenance  Topic Date Due  . PNA vac Low Risk Adult (2 of 2 - PPSV23) 10/28/2018 (Originally 06/16/2017)  . OPHTHALMOLOGY EXAM  04/23/2019 (Originally 07/02/2018)  . FOOT EXAM  11/19/2018  . INFLUENZA VACCINE  11/22/2018  . HEMOGLOBIN A1C  01/27/2019  . COLONOSCOPY  10/10/2021  . DTaP/Tdap/Td (2 - Td) 01/27/2024  . TETANUS/TDAP  01/27/2024  . Hepatitis C Screening  Completed       Plan:    I have personally reviewed, addressed, and noted the following in the patient's chart:  A. Medical and social history B. Use of alcohol, tobacco or illicit drugs  C. Current medications and supplements D. Functional ability and status E.  Nutritional status F.  Physical activity G. Advance directives H. List of other physicians I.  Hospitalizations, surgeries, and ER visits in  previous 12 months J.  Vitals (unless it is a telemedicine encounter) K. Screenings to include cognitive, depression, hearing, vision (NOTE: hearing and vision screenings not completed in telemedicine encounter) L. Referrals and appointments   In addition, I have reviewed and discussed with patient certain preventive protocols, quality metrics, and best practice recommendations. A written personalized care plan for preventive services and recommendations were provided to patient.  With patient's permission, we connected on 10/16/18 at 11:30 AM EDT. Interactive audio and video telecommunications were attempted with patient. This attempt was unsuccessful due to patient having technical difficulties OR patient did not have access to video capability.  Encounter was completed with audio only.  Two patient identifiers were used to ensure the encounter occurred with the correct person. Patient was in  home and writer was in office.     Signed,   Lindell Noe, MHA, BS, RN Health Coach

## 2018-10-16 NOTE — Patient Instructions (Signed)
[name] , Thank you for taking time to come for your Medicare Wellness Visit. I appreciate your ongoing commitment to your health goals. Please review the following plan we discussed and let me know if I can assist you in the future.   These are the goals we discussed: Goals    . Increase physical activity     Starting 02/02/2016, I will continue to participate in physical therapy as prescribed.     . Increase water intake     Starting 03/04/2017, I will continue to drink at least 6-8 glasses of water daily.        This is a list of the screening recommended for you and due dates:  Health Maintenance  Topic Date Due  . Pneumonia vaccines (2 of 2 - PPSV23) 10/28/2018*  . Eye exam for diabetics  04/23/2019*  . Complete foot exam   11/19/2018  . Flu Shot  11/22/2018  . Hemoglobin A1C  01/27/2019  . Colon Cancer Screening  10/10/2021  . DTaP/Tdap/Td vaccine (2 - Td) 01/27/2024  . Tetanus Vaccine  01/27/2024  .  Hepatitis C: One time screening is recommended by Center for Disease Control  (CDC) for  adults born from 18 through 1966.   Completed  *Topic was postponed. The date shown is not the original due date.   Preventive Care for Adults  A healthy lifestyle and preventive care can promote health and wellness. Preventive health guidelines for adults include the following key practices.  . A routine yearly physical is a good way to check with your health care provider about your health and preventive screening. It is a chance to share any concerns and updates on your health and to receive a thorough exam.  . Visit your dentist for a routine exam and preventive care every 6 months. Brush your teeth twice a day and floss once a day. Good oral hygiene prevents tooth decay and gum disease.  . The frequency of eye exams is based on your age, health, family medical history, use  of contact lenses, and other factors. Follow your health care provider's recommendations for frequency of  eye exams.  . Eat a healthy diet. Foods like vegetables, fruits, whole grains, low-fat dairy products, and lean protein foods contain the nutrients you need without too many calories. Decrease your intake of foods high in solid fats, added sugars, and salt. Eat the right amount of calories for you. Get information about a proper diet from your health care provider, if necessary.  . Regular physical exercise is one of the most important things you can do for your health. Most adults should get at least 150 minutes of moderate-intensity exercise (any activity that increases your heart rate and causes you to sweat) each week. In addition, most adults need muscle-strengthening exercises on 2 or more days a week.  Silver Sneakers may be a benefit available to you. To determine eligibility, you may visit the website: www.silversneakers.com or contact program at (418)767-2716 Mon-Fri between 8AM-8PM.   . Maintain a healthy weight. The body mass index (BMI) is a screening tool to identify possible weight problems. It provides an estimate of body fat based on height and weight. Your health care provider can find your BMI and can help you achieve or maintain a healthy weight.   For adults 20 years and older: ? A BMI below 18.5 is considered underweight. ? A BMI of 18.5 to 24.9 is normal. ? A BMI of 25 to 29.9 is considered  overweight. ? A BMI of 30 and above is considered obese.   . Maintain normal blood lipids and cholesterol levels by exercising and minimizing your intake of saturated fat. Eat a balanced diet with plenty of fruit and vegetables. Blood tests for lipids and cholesterol should begin at age 57 and be repeated every 5 years. If your lipid or cholesterol levels are high, you are over 50, or you are at high risk for heart disease, you may need your cholesterol levels checked more frequently. Ongoing high lipid and cholesterol levels should be treated with medicines if diet and exercise are not  working.  . If you smoke, find out from your health care provider how to quit. If you do not use tobacco, please do not start.  . If you choose to drink alcohol, please do not consume more than 2 drinks per day. One drink is considered to be 12 ounces (355 mL) of beer, 5 ounces (148 mL) of wine, or 1.5 ounces (44 mL) of liquor.  . If you are 50-17 years old, ask your health care provider if you should take aspirin to prevent strokes.  . Use sunscreen. Apply sunscreen liberally and repeatedly throughout the day. You should seek shade when your shadow is shorter than you. Protect yourself by wearing long sleeves, pants, a wide-brimmed hat, and sunglasses year round, whenever you are outdoors.  . Once a month, do a whole body skin exam, using a mirror to look at the skin on your back. Tell your health care provider of new moles, moles that have irregular borders, moles that are larger than a pencil eraser, or moles that have changed in shape or color.

## 2018-10-20 NOTE — Progress Notes (Signed)
I reviewed health advisor's note, was available for consultation, and agree with documentation and plan.  

## 2018-10-21 LAB — FRUCTOSAMINE: Fructosamine: 242 umol/L (ref 205–285)

## 2018-10-23 ENCOUNTER — Other Ambulatory Visit: Payer: Self-pay | Admitting: Family Medicine

## 2018-10-23 NOTE — Telephone Encounter (Signed)
Left message on vm for pt to call back.  Need to relay Dr. G's message.  

## 2018-10-23 NOTE — Telephone Encounter (Signed)
E prescribed #60 naprosyn. Also plz notify - I received sugar log - largely well controlled readings, keep up the good work!

## 2018-10-24 NOTE — Telephone Encounter (Signed)
plz also ask him to bring all medications to next office visit to perform med rec. There's a question of whether he's been taking aspirin and glipizide.

## 2018-10-28 ENCOUNTER — Ambulatory Visit (INDEPENDENT_AMBULATORY_CARE_PROVIDER_SITE_OTHER): Payer: Medicare Other | Admitting: Family Medicine

## 2018-10-28 ENCOUNTER — Encounter: Payer: Self-pay | Admitting: Family Medicine

## 2018-10-28 ENCOUNTER — Other Ambulatory Visit: Payer: Self-pay

## 2018-10-28 VITALS — BP 130/80 | HR 74 | Temp 97.8°F | Ht 67.5 in | Wt 228.4 lb

## 2018-10-28 DIAGNOSIS — Z7189 Other specified counseling: Secondary | ICD-10-CM | POA: Diagnosis not present

## 2018-10-28 DIAGNOSIS — E1142 Type 2 diabetes mellitus with diabetic polyneuropathy: Secondary | ICD-10-CM | POA: Diagnosis not present

## 2018-10-28 DIAGNOSIS — G459 Transient cerebral ischemic attack, unspecified: Secondary | ICD-10-CM

## 2018-10-28 DIAGNOSIS — E1165 Type 2 diabetes mellitus with hyperglycemia: Secondary | ICD-10-CM | POA: Diagnosis not present

## 2018-10-28 DIAGNOSIS — Z0000 Encounter for general adult medical examination without abnormal findings: Secondary | ICD-10-CM

## 2018-10-28 DIAGNOSIS — E118 Type 2 diabetes mellitus with unspecified complications: Secondary | ICD-10-CM

## 2018-10-28 DIAGNOSIS — E7800 Pure hypercholesterolemia, unspecified: Secondary | ICD-10-CM

## 2018-10-28 DIAGNOSIS — IMO0002 Reserved for concepts with insufficient information to code with codable children: Secondary | ICD-10-CM

## 2018-10-28 DIAGNOSIS — D509 Iron deficiency anemia, unspecified: Secondary | ICD-10-CM

## 2018-10-28 DIAGNOSIS — C61 Malignant neoplasm of prostate: Secondary | ICD-10-CM

## 2018-10-28 DIAGNOSIS — I2510 Atherosclerotic heart disease of native coronary artery without angina pectoris: Secondary | ICD-10-CM

## 2018-10-28 DIAGNOSIS — I10 Essential (primary) hypertension: Secondary | ICD-10-CM

## 2018-10-28 LAB — POCT GLYCOSYLATED HEMOGLOBIN (HGB A1C): Hemoglobin A1C: 6.8 % — AB (ref 4.0–5.6)

## 2018-10-28 MED ORDER — LOSARTAN POTASSIUM 50 MG PO TABS: 50.0000 mg | ORAL_TABLET | Freq: Every day | ORAL | 3 refills | Status: DC

## 2018-10-28 MED ORDER — GABAPENTIN 100 MG PO CAPS: ORAL_CAPSULE | ORAL | 3 refills | Status: DC

## 2018-10-28 MED ORDER — CARVEDILOL 3.125 MG PO TABS: 3.1250 mg | ORAL_TABLET | Freq: Two times a day (BID) | ORAL | 3 refills | Status: DC

## 2018-10-28 MED ORDER — METFORMIN HCL 1000 MG PO TABS: 1000.0000 mg | ORAL_TABLET | Freq: Two times a day (BID) | ORAL | 3 refills | Status: DC

## 2018-10-28 MED ORDER — GLIPIZIDE ER 10 MG PO TB24: 10.0000 mg | ORAL_TABLET | Freq: Every day | ORAL | 3 refills | Status: DC

## 2018-10-28 MED ORDER — JARDIANCE 10 MG PO TABS: 10.0000 mg | ORAL_TABLET | Freq: Every day | ORAL | 3 refills | Status: DC

## 2018-10-28 MED ORDER — ATORVASTATIN CALCIUM 80 MG PO TABS: 80.0000 mg | ORAL_TABLET | Freq: Every day | ORAL | 3 refills | Status: DC

## 2018-10-28 NOTE — Addendum Note (Signed)
Addended by: Ellamae Sia on: 10/28/2018 11:28 AM   Modules accepted: Orders

## 2018-10-28 NOTE — Assessment & Plan Note (Signed)
H/o this. Continue aspirin, statin.  

## 2018-10-28 NOTE — Assessment & Plan Note (Signed)
Chronic, stable. Continue current regimen. meds refilled.

## 2018-10-28 NOTE — Addendum Note (Signed)
Addended by: Brenton Grills on: 09/22/8313 17:61 AM   Modules accepted: Orders

## 2018-10-28 NOTE — Assessment & Plan Note (Signed)
Preventative protocols reviewed and updated unless pt declined. Discussed healthy diet and lifestyle.  

## 2018-10-28 NOTE — Assessment & Plan Note (Addendum)
Continue aspirin, statin.  

## 2018-10-28 NOTE — Assessment & Plan Note (Signed)
Advanced directive - daughter is HCPOA. Wants to be cremated. Has discussed this with daughter. Does not have advanced directive.

## 2018-10-28 NOTE — Assessment & Plan Note (Signed)
Appreciate uro care [name])

## 2018-10-28 NOTE — Assessment & Plan Note (Signed)
Mild anemia noted. Iron levels reassuring.

## 2018-10-28 NOTE — Assessment & Plan Note (Signed)
Encouraged healthy diet and lifestyle changes to affect sustainable weight loss.  

## 2018-10-28 NOTE — Telephone Encounter (Signed)
Pt seen by Dr. Darnell Level today.

## 2018-10-28 NOTE — Addendum Note (Signed)
Addended by: Brenton Grills on: 9/0/9030 14:99 AM   Modules accepted: Orders
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
## 2018-10-28 NOTE — Patient Instructions (Addendum)
A1c today.  Call GI Carlean Purl) to schedule appointment to discuss colonoscopy.  If interested, check with pharmacy about new 2 shot shingles series (shingrix).  Fax eye exam report to [phone].  You are doing well today! Continue current medicines. Return as needed or in 4-6 months for diabetes follow up visit.   Health Maintenance After Age 69 After age 51, you are at a higher risk for certain long-term diseases and infections as well as injuries from falls. Falls are a major cause of broken bones and head injuries in people who are older than age 32. Getting regular preventive care can help to keep you healthy and well. Preventive care includes getting regular testing and making lifestyle changes as recommended by your health care provider. Talk with your health care provider about:  Which screenings and tests you should have. A screening is a test that checks for a disease when you have no symptoms.  A diet and exercise plan that is right for you. What should I know about screenings and tests to prevent falls? Screening and testing are the best ways to find a health problem early. Early diagnosis and treatment give you the best chance of managing medical conditions that are common after age 19. Certain conditions and lifestyle choices may make you more likely to have a fall. Your health care provider may recommend:  Regular vision checks. Poor vision and conditions such as cataracts can make you more likely to have a fall. If you wear glasses, make sure to get your prescription updated if your vision changes.  Medicine review. Work with your health care provider to regularly review all of the medicines you are taking, including over-the-counter medicines. Ask your health care provider about any side effects that may make you more likely to have a fall. Tell your health care provider if any medicines that you take make you feel dizzy or sleepy.  Osteoporosis screening. Osteoporosis is a  condition that causes the bones to get weaker. This can make the bones weak and cause them to break more easily.  Blood pressure screening. Blood pressure changes and medicines to control blood pressure can make you feel dizzy.  Strength and balance checks. Your health care provider may recommend certain tests to check your strength and balance while standing, walking, or changing positions.  Foot health exam. Foot pain and numbness, as well as not wearing proper footwear, can make you more likely to have a fall.  Depression screening. You may be more likely to have a fall if you have a fear of falling, feel emotionally low, or feel unable to do activities that you used to do.  Alcohol use screening. Using too much alcohol can affect your balance and may make you more likely to have a fall. What actions can I take to lower my risk of falls? General instructions  Talk with your health care provider about your risks for falling. Tell your health care provider if: ? You fall. Be sure to tell your health care provider about all falls, even ones that seem minor. ? You feel dizzy, sleepy, or off-balance.  Take over-the-counter and prescription medicines only as told by your health care provider. These include any supplements.  Eat a healthy diet and maintain a healthy weight. A healthy diet includes low-fat dairy products, low-fat (lean) meats, and fiber from whole grains, beans, and lots of fruits and vegetables. Home safety  Remove any tripping hazards, such as rugs, cords, and clutter.  Install safety  equipment such as grab bars in bathrooms and safety rails on stairs.  Keep rooms and walkways well-lit. Activity   Follow a regular exercise program to stay fit. This will help you maintain your balance. Ask your health care provider what types of exercise are appropriate for you.  If you need a cane or walker, use it as recommended by your health care provider.  Wear supportive shoes  that have nonskid soles. Lifestyle  Do not drink alcohol if your health care provider tells you not to drink.  If you drink alcohol, limit how much you have: ? 0-1 drink a day for women. ? 0-2 drinks a day for men.  Be aware of how much alcohol is in your drink. In the [place] one drink equals one typical bottle of beer (12 oz), one-half glass of wine (5 oz), or one shot of hard liquor (1 oz).  Do not use any products that contain nicotine or tobacco, such as cigarettes and e-cigarettes. If you need help quitting, ask your health care provider. Summary  Having a healthy lifestyle and getting preventive care can help to protect your health and wellness after age 52.  Screening and testing are the best way to find a health problem early and help you avoid having a fall. Early diagnosis and treatment give you the best chance for managing medical conditions that are more common for people who are older than age 103.  Falls are a major cause of broken bones and head injuries in people who are older than age 43. Take precautions to prevent a fall at home.  Work with your health care provider to learn what changes you can make to improve your health and wellness and to prevent falls. This information is not intended to replace advice given to you by your health care provider. Make sure you discuss any questions you have with your health care provider. Document Released: 02/20/2017 Document Revised: 07/31/2018 Document Reviewed: 02/20/2017 Elsevier Patient Education  2020 [employer].

## 2018-10-28 NOTE — Assessment & Plan Note (Addendum)
Chronic, stable on high dose atorvastatin - continue. Goal LDL <70.  The ASCVD Risk score Mikey Bussing DC Jr., et al., 2013) failed to calculate for the following reasons:   The valid total cholesterol range is 130 to 320 mg/dL

## 2018-10-28 NOTE — Assessment & Plan Note (Signed)
Chronic, improved control since starting jardiance.  Update POC A1c today. Good control based on cbg log and recent fructosamine.

## 2018-11-04 ENCOUNTER — Telehealth: Payer: Self-pay | Admitting: Family Medicine

## 2018-11-04 NOTE — Telephone Encounter (Signed)
Best number [phone]  Pt called wanting a call back regarding his medication pt would not go into detail

## 2018-11-05 NOTE — Telephone Encounter (Signed)
Attempt to return pt's call.  Left message on vm for pt to call back.

## 2018-11-07 NOTE — Telephone Encounter (Signed)
Attempt to return pt's call.  Left message on vm for pt to call back.

## 2018-11-14 NOTE — Telephone Encounter (Signed)
Ok to stay off carvedilol for now.  Let us know how blood pressures run off this medicine - may need to start another med.  This med sometimes helps sugar control so will also need to monitor sugar levels off med.

## 2018-11-14 NOTE — Telephone Encounter (Signed)
Spoke with patient. Patient states he was having issue with feeling like a zombie, nauseas, and not feeling like himself "was not thinking right." He was trying to figure out what was causing him to feel that way and Carvedilol was a new medication so he stopped stopped taking it about 1 1/2 weeks ago and has felt fine since then. States b/p and sugar levels have been fine. He mentions that his sister told him she use to take this medication and she had same symptoms. Does he need to take anything else in place of Carvedilol?

## 2018-11-17 NOTE — Telephone Encounter (Signed)
Spoke with pt relaying Dr. G's message. Pt verbalizes understanding.  

## 2018-11-19 ENCOUNTER — Encounter: Payer: Self-pay | Admitting: Family Medicine

## 2018-11-27 ENCOUNTER — Ambulatory Visit (INDEPENDENT_AMBULATORY_CARE_PROVIDER_SITE_OTHER): Payer: Medicare Other | Admitting: Internal Medicine

## 2018-11-27 ENCOUNTER — Encounter: Payer: Self-pay | Admitting: Internal Medicine

## 2018-11-27 VITALS — BP 120/72 | HR 82 | Temp 98.7°F | Ht 67.0 in | Wt 231.0 lb

## 2018-11-27 DIAGNOSIS — R1310 Dysphagia, unspecified: Secondary | ICD-10-CM | POA: Diagnosis not present

## 2018-11-27 DIAGNOSIS — Z1211 Encounter for screening for malignant neoplasm of colon: Secondary | ICD-10-CM | POA: Diagnosis not present

## 2018-11-27 DIAGNOSIS — R1319 Other dysphagia: Secondary | ICD-10-CM

## 2018-11-27 NOTE — Progress Notes (Signed)
[name] 68 y.o. 26-Dec-1950 [id number]  Assessment & Plan:   Encounter Diagnoses  Name Primary?  . Esophageal dysphagia Yes  . Colon cancer screening    Upper GI endoscopy to evaluate and potentially treat the esophageal dysphagia.   Screening colonoscopy will be scheduled as well.  The risks and benefits as well as alternatives of endoscopic procedure(s) have been discussed and reviewed. All questions answered. The patient agrees to proceed. I appreciate the opportunity to care for this patient. CC: Ria Bush, MD   Subjective:   Chief Complaint: Discuss colon cancer screening with colonoscopy  HPI The patient is a 68 year old man known to me from a previous colonoscopy and EGD for GI bleeding in 2013, that exam was negative for source of bleed though he had hemorrhoids and diverticulosis.  I think he probably had bleeding from hemorrhoids at that time.  I was unable to completely enter his cecum so I thought having a colonoscopy follow-up earlier than 10 years made some sense.  He is not have any active GI symptoms other than occasional constipation.  That is chronic no change in bowel habits or bleeding.  He does report esophageal dysphagia which he has had in the past as well and in 2013 he had a normal EGD but I performed a 68 French Maloney dilation.  No heartburn or indigestion except occasionally.  He was recently seen in the hospital for chest pain and he continues to have some fleeting sharp chest pains once or twice a day.  He was admitted ruled out for MI when he had chest pain and dyspnea in May.  Cardiac catheterization was normal. Allergies  Allergen Reactions  . Gabapentin     Dizziness  . Glimepiride Other (See Comments)    Headache, back pain  . Menthol (Topical Analgesic) Rash    Burning rash   Current Meds  Medication Sig  . aspirin EC 81 MG tablet Take 81 mg by mouth daily.  Marland Kitchen atorvastatin (LIPITOR) 80 MG tablet Take 1 tablet (80 mg  total) by mouth daily.  . baclofen (LIORESAL) 10 MG tablet TAKE 1 TABLET BY MOUTH AT  BEDTIME AS NEEDED FOR BACK  SPASMS (Patient taking differently: Take 10 mg by mouth at bedtime as needed for muscle spasms. )  . empagliflozin (JARDIANCE) 10 MG TABS tablet Take 10 mg by mouth daily.  . fluticasone (FLONASE) 50 MCG/ACT nasal spray USE 2 SPRAYS IN EACH  NOSTRIL DAILY (Patient taking differently: Place 2 sprays into both nostrils daily. )  . gabapentin (NEURONTIN) 100 MG capsule TAKE 1 CAPSULE BY MOUTH TWO TIMES DAILY  . glipiZIDE (GLUCOTROL XL) 10 MG 24 hr tablet Take 1 tablet (10 mg total) by mouth daily with breakfast.  . glucose blood (ONE TOUCH ULTRA TEST) test strip USE WITH METER TO CHECK  SUGAR TWICE DAILY.  Marland Kitchen losartan (COZAAR) 50 MG tablet Take 1 tablet (50 mg total) by mouth daily.  . metFORMIN (GLUCOPHAGE) 1000 MG tablet Take 1 tablet (1,000 mg total) by mouth 2 (two) times daily with a meal.  . naproxen (NAPROSYN) 375 MG tablet TAKE 1 TABLET BY MOUTH  TWICE DAILY AS NEEDED WITH  A MEAL  . oxyCODONE-acetaminophen (PERCOCET) 10-325 MG tablet Take 1 tablet by mouth every 4 (four) hours as needed for pain.    Past Medical History:  Diagnosis Date  . Abdominal wall hernia   . Anxiety   . Back pain    with radiculopathy  . Bladder wall  thickening 2013   nl rpt CT scan, normal cytology and UCx, released from urology Karsten Ro)  . Chest pain, non-cardiac 2008   adenosine myoview low risk.  EF 60%.  normal cath 2008, deemed atypical /noncardiac, ?discogenic  . Chronic neck pain   . Chronic pain    narcotic dependent ([place])  . Circadian rhythm sleep disorder, irregular sleep-wake type   . Complication of anesthesia    hard to put patient to sleep  . Depression   . Diverticulosis 2013   by CT scan  . Esophageal stricture 06/2007   dilation of stricture  . GERD (gastroesophageal reflux disease)   . Gross hematuria 2013, 2016   negative CT, cystoscopy, cytology, 2016 - thought from E  coli UTI s/p appropriate treatment ([place])  . History of cocaine use 2004  . History of epididymitis   . History of prostatitis 08/2011   ?chronic/simmering, treated with 1 mo cipro  . Hyperlipidemia   . Hypertension    Essential  . OSA (obstructive sleep apnea) 2010   mild off CPAP  . Postlaminectomy syndrome of cervical region    s/p ACDF C5-7 2010 ([place])  . Prostate cancer ([place]) 06/16/2012   T2a, Gleason 6 prostate cancer found by R apical nodule with PSA 2.18 Karsten Ro, [place]) 04/2016  . T2DM (type 2 diabetes mellitus) ([place])   . TIA (transient ischemic attack)    cocaine related   Past Surgical History:  Procedure Laterality Date  . ANTERIOR CERVICAL DISCECTOMY  10/28/2008   with fusion (Dr. Rolena Infante)  . CARDIAC CATHETERIZATION  2008   WNL   . CARPAL TUNNEL RELEASE     Right  . CATARACT EXTRACTION Bilateral 12/2014, 01/2015   Dr Tommy Rainwater  . COLONOSCOPY  10/11/2011   severe diverticulosis, int hemorrhoids, rec rpt 5 yrs Carlean Purl)  . DIRECT LARYNGOSCOPY  11/02/2008   Extubation under anesthesia (Dr. Erik Obey)  . ESOPHAGOGASTRODUODENOSCOPY  07/17/07   Esoph. Stricture dilated (Dr. Henrene Pastor)  . ESOPHAGOGASTRODUODENOSCOPY  10/11/2011   WNL Carlean Purl)  . EXCISION MASS NECK N/A 08/29/2016   Procedure: EXCISION MASS POSTERIOR NECK;  Surgeon: Erroll Luna, MD;  Location: [facility];  Service: General;  Laterality: N/A;  . HEMATOMA EVACUATION  10/29/2008   (Dr. Rolena Infante)  . INGUINAL HERNIA REPAIR     bilateral, 5 cm. RLQ Abd Lipoma Excision (Dr. Lucia Gaskins)  . LEFT HEART CATH AND CORONARY ANGIOGRAPHY N/A 08/29/2018   Procedure: LEFT HEART CATH AND CORONARY ANGIOGRAPHY;  Surgeon: Jettie Booze, MD;  Location: [facility];  Service: Cardiovascular;  Laterality: N/A;  . LIPOMA EXCISION  08/2016   posterior neck  . SHOULDER ARTHROSCOPY WITH SUBACROMIAL DECOMPRESSION AND OPEN ROTATOR C Left 01/23/2013   Procedure: LEFT SHOULDER ARTHROSCOPY WITH SUBACROMIAL DECOMPRESSION  AND MINI OPEN ROTATOR CUFF REPAIR, AND OPEN DISTAL CLAVICLE RESECTION;  Surgeon: Augustin Schooling, MD;  Location: [facility];  Service: Orthopedics;  Laterality: Left;  . SHOULDER SURGERY Right 2013   replaced  . TONSILLECTOMY  childhood  . 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   Activity: no regular exercise, wants to start walking   Diet: good water, fruits/vegetables daily.      Retired   1 daughter and 1 granddaughter   family  history includes Cancer in his paternal grandfather and paternal uncle; Diabetes in his brother, brother, brother, father, mother, and sister; Heart disease in his maternal uncle; Hyperlipidemia in his mother; Hypertension in his brother, brother, mother, and sister.   Review of Systems As per HPI.  All other review of systems negative.  Objective:   Physical Exam @BP  120/72   Pulse 82   Temp 98.7 F (37.1 C)   Ht 5\' 7"  (1.702 m)   Wt 231 lb (104.8 kg)   BMI 36.18 kg/m @  General:  Well-developed, well-nourished and in no acute distress Eyes:  anicteric. ENT:   Mouth and posterior pharynx free of lesions.  Neck:   supple w/o thyromegaly or mass.  Lungs: Clear to auscultation bilaterally. Heart:  S1S2, no rubs, murmurs, gallops. Abdomen:  soft, non-tender, no hepatosplenomegaly, hernia, or mass and BS+.  Rectal: Deferred until til colonoscopy Lymph:  no cervical or supraclavicular adenopathy. Extremities:   no edema, cyanosis or clubbing Skin   no rash. Neuro:  A&O x 3.  Psych:  appropriate mood and  Affect.   Data Reviewed: May 2020 hospital records, labs in the EMR, cardiac cath results, recent primary care visits.  And as per HPI.

## 2018-11-27 NOTE — Patient Instructions (Signed)
If you are age 69 or older, your body mass index should be between 23-30. Your Body mass index is 36.18 kg/m. If this is out of the aforementioned range listed, please consider follow up with your Primary Care Provider.  If you are age 22 or younger, your body mass index should be between 19-25. Your Body mass index is 36.18 kg/m. If this is out of the aformentioned range listed, please consider follow up with your Primary Care Provider.   You have been scheduled for an endoscopy and colonoscopy. Please follow the written instructions given to you at your visit today. Please pick up your prep supplies at the pharmacy within the next 1-3 days. If you use inhalers (even only as needed), please bring them with you on the day of your procedure.   Thank you for choosing [facility] for your health care needs!  Dr. Silvano Rusk

## 2018-12-19 ENCOUNTER — Encounter: Payer: Self-pay | Admitting: Podiatry

## 2018-12-19 ENCOUNTER — Other Ambulatory Visit: Payer: Self-pay

## 2018-12-19 ENCOUNTER — Ambulatory Visit: Payer: Medicare Other | Admitting: Podiatry

## 2018-12-19 DIAGNOSIS — M79674 Pain in right toe(s): Secondary | ICD-10-CM

## 2018-12-19 DIAGNOSIS — B351 Tinea unguium: Secondary | ICD-10-CM

## 2018-12-19 DIAGNOSIS — M79675 Pain in left toe(s): Secondary | ICD-10-CM

## 2018-12-19 DIAGNOSIS — E1142 Type 2 diabetes mellitus with diabetic polyneuropathy: Secondary | ICD-10-CM | POA: Diagnosis not present

## 2018-12-19 NOTE — Progress Notes (Signed)
Complaint:  Visit Type: Patient returns to my office for continued preventative foot care services. Complaint: Patient states" my nails have grown long and thick and become painful to walk and wear shoes" Patient has been diagnosed with DM with foot neuropathy. The patient presents for preventative foot care services. No changes to ROS  Podiatric Exam: Vascular: dorsalis pedis and posterior tibial pulses are palpable bilateral. Capillary return is immediate. Temperature gradient is WNL. Skin turgor WNL  Sensorium: Normal Semmes Weinstein monofilament test. Normal tactile sensation bilaterally. Nail Exam: Pt has thick disfigured discolored nails with subungual debris noted bilateral entire nail hallux through fifth toenails Ulcer Exam: There is no evidence of ulcer or pre-ulcerative changes or infection. Orthopedic Exam: Muscle tone and strength are WNL. No limitations in general ROM. No crepitus or effusions noted. Foot type and digits show no abnormalities. Bony prominences are unremarkable. Skin: No Porokeratosis. No infection or ulcers  Diagnosis:  Onychomycosis, , Pain in right toe, pain in left toes  Treatment & Plan Procedures and Treatment: Consent by patient was obtained for treatment procedures.   Debridement of mycotic and hypertrophic toenails, 1 through 5 bilateral and clearing of subungual debris. No ulceration, no infection noted.  Return Visit-Office Procedure: Patient instructed to return to the office for a follow up visit 10 weeks  for continued evaluation and treatment.    Gardiner Barefoot DPM

## 2018-12-23 HISTORY — PX: ESOPHAGOGASTRODUODENOSCOPY: SHX1529

## 2018-12-23 HISTORY — PX: COLONOSCOPY: SHX174

## 2018-12-24 ENCOUNTER — Encounter: Payer: Self-pay | Admitting: Internal Medicine

## 2018-12-29 ENCOUNTER — Telehealth: Payer: Self-pay

## 2018-12-29 NOTE — Telephone Encounter (Signed)
Covid-19 screening questions   Do you now or have you had a fever in the last 14 days? NO   Do you have any respiratory symptoms of shortness of breath or cough now or in the last 14 days? NO   Do you have any family members or close contacts with diagnosed or suspected Covid-19 in the past 14 days? NO   Have you been tested for Covid-19 and found to be positive? YES, about three months ago. He states he has been clear for a while now.

## 2018-12-30 ENCOUNTER — Other Ambulatory Visit: Payer: Self-pay

## 2018-12-30 ENCOUNTER — Ambulatory Visit (AMBULATORY_SURGERY_CENTER): Payer: Medicare Other | Admitting: Internal Medicine

## 2018-12-30 ENCOUNTER — Encounter: Payer: Self-pay | Admitting: Internal Medicine

## 2018-12-30 VITALS — BP 135/71 | HR 49 | Temp 98.9°F | Resp 17 | Ht 67.0 in | Wt 231.0 lb

## 2018-12-30 DIAGNOSIS — K222 Esophageal obstruction: Secondary | ICD-10-CM | POA: Diagnosis not present

## 2018-12-30 DIAGNOSIS — E119 Type 2 diabetes mellitus without complications: Secondary | ICD-10-CM | POA: Diagnosis not present

## 2018-12-30 DIAGNOSIS — Z1211 Encounter for screening for malignant neoplasm of colon: Secondary | ICD-10-CM | POA: Diagnosis not present

## 2018-12-30 DIAGNOSIS — K253 Acute gastric ulcer without hemorrhage or perforation: Secondary | ICD-10-CM | POA: Diagnosis not present

## 2018-12-30 DIAGNOSIS — K621 Rectal polyp: Secondary | ICD-10-CM

## 2018-12-30 DIAGNOSIS — R1310 Dysphagia, unspecified: Secondary | ICD-10-CM

## 2018-12-30 DIAGNOSIS — K3189 Other diseases of stomach and duodenum: Secondary | ICD-10-CM | POA: Diagnosis not present

## 2018-12-30 MED ORDER — OMEPRAZOLE 40 MG PO CPDR: 40.0000 mg | DELAYED_RELEASE_CAPSULE | Freq: Every day | ORAL | 0 refills | Status: DC

## 2018-12-30 MED ORDER — SODIUM CHLORIDE 0.9 % IV SOLN: 500.0000 mL | Freq: Once | INTRAVENOUS | Status: DC

## 2018-12-30 NOTE — Progress Notes (Signed)
Pt's states no medical or surgical changes since previsit or office visit.  AM - temp CW - vitals 

## 2018-12-30 NOTE — Patient Instructions (Addendum)
I found an ulcer and some inflammation in the stomach (gastritis). Biopsies taken.  I dilated a narrow area in the esophagus.  I sent a prescription to the local  pharmacy for omeprazole - this will heal the ulcers and help you swallow better also.  I think the naproxen you take for arthritis is causing stomach ulcer and inflammation. I want you to NOT take naproxen for 3 weeks and then you can use it again if  needed. Use Extra strength Tylenol (acetaminophen) 2 tabs 3 times a day max.  I found and removed one tiny colon polyp.   I will let you know pathology results and plans.  I appreciate the opportunity to care for you. Gatha Mayer, MD, [facility]  Handouts given for polyps, diverticulosis, gastritis, NSAIDS and ulcers.   YOU HAD AN ENDOSCOPIC PROCEDURE TODAY AT [facility]:   Refer to the procedure report that was given to you for any specific questions about what was found during the examination.  If the procedure report does not answer your questions, please call your gastroenterologist to clarify.  If you requested that your care partner not be given the details of your procedure findings, then the procedure report has been included in a sealed envelope for you to review at your convenience later.  YOU SHOULD EXPECT: Some feelings of bloating in the abdomen. Passage of more gas than usual.  Walking can help get rid of the air that was put into your GI tract during the procedure and reduce the bloating. If you had a lower endoscopy (such as a colonoscopy or flexible sigmoidoscopy) you may notice spotting of blood in your stool or on the toilet paper. If you underwent a bowel prep for your procedure, you may not have a normal bowel movement for a few days.  Please Note:  You might notice some irritation and congestion in your nose or some drainage.  This is from the oxygen used during your procedure.  There is no need for concern and it should clear up in a day or  so.  SYMPTOMS TO REPORT IMMEDIATELY:   Following lower endoscopy (colonoscopy or flexible sigmoidoscopy):  Excessive amounts of blood in the stool  Significant tenderness or worsening of abdominal pains  Swelling of the abdomen that is new, acute  Fever of 100F or higher   Following upper endoscopy (EGD)  Vomiting of blood or coffee ground material  New chest pain or pain under the shoulder blades  Painful or persistently difficult swallowing  New shortness of breath  Fever of 100F or higher  Black, tarry-looking stools  For urgent or emergent issues, a gastroenterologist can be reached at any hour by calling [phone].   DIET:  We do recommend a small meal at first, but then you may proceed to your regular diet.  Drink plenty of fluids but you should avoid alcoholic beverages for 24 hours.  ACTIVITY:  You should plan to take it easy for the rest of today and you should NOT DRIVE or use heavy machinery until tomorrow (because of the sedation medicines used during the test).    FOLLOW UP: Our staff will call the number listed on your records 48-72 hours following your procedure to check on you and address any questions or concerns that you may have regarding the information given to you following your procedure. If we do not reach you, we will leave a message.  We will attempt to reach you two times.  During this call, we will ask if you have developed any symptoms of COVID 19. If you develop any symptoms (ie: fever, flu-like symptoms, shortness of breath, cough etc.) before then, please call [phone].  If you test positive for Covid 19 in the 2 weeks post procedure, please call and report this information to [place].    If any biopsies were taken you will be contacted by phone or by letter within the next 1-3 weeks.  Please call us at [phone] if you have not heard about the biopsies in 3 weeks.    SIGNATURES/CONFIDENTIALITY: You and/or your care partner have signed  paperwork which will be entered into your electronic medical record.  These signatures attest to the fact that that the information above on your After Visit Summary has been reviewed and is understood.  Full responsibility of the confidentiality of this discharge information lies with you and/or your care-partner.

## 2018-12-30 NOTE — Progress Notes (Signed)
Report to PACU, RN, vss, BBS= Clear.  

## 2018-12-30 NOTE — Op Note (Signed)
[facility] Patient Name: [name] Procedure Date: 12/30/2018 2:07 PM MRN: [id number] Endoscopist: Gatha Mayer , MD Age: 68 Referring MD:  Date of Birth: 1950/08/02 Gender: Male Account #: [id number] Procedure:                Colonoscopy Indications:              Screening for colorectal malignant neoplasm Medicines:                Propofol per Anesthesia, Monitored Anesthesia Care Procedure:                Pre-Anesthesia Assessment:                           - Prior to the procedure, a History and Physical                            was performed, and patient medications and                            allergies were reviewed. The patient's tolerance of                            previous anesthesia was also reviewed. The risks                            and benefits of the procedure and the sedation                            options and risks were discussed with the patient.                            All questions were answered, and informed consent                            was obtained. Prior Anticoagulants: The patient has                            taken no previous anticoagulant or antiplatelet                            agents. ASA Grade Assessment: II - A patient with                            mild systemic disease. After reviewing the risks                            and benefits, the patient was deemed in                            satisfactory condition to undergo the procedure.                           - Prior to the procedure, a History and Physical  was performed, and patient medications and                            allergies were reviewed. The patient's tolerance of                            previous anesthesia was also reviewed. The risks                            and benefits of the procedure and the sedation                            options and risks were discussed with the patient.   All questions were answered, and informed consent                            was obtained. Prior Anticoagulants: The patient has                            taken no previous anticoagulant or antiplatelet                            agents. ASA Grade Assessment: II - A patient with                            mild systemic disease. After reviewing the risks                            and benefits, the patient was deemed in                            satisfactory condition to undergo the procedure.                           After obtaining informed consent, the colonoscope                            was passed under direct vision. Throughout the                            procedure, the patient's blood pressure, pulse, and                            oxygen saturations were monitored continuously. The                            Colonoscope was introduced through the anus and                            advanced to the the cecum, identified by                            appendiceal orifice and ileocecal valve. The  colonoscopy was somewhat difficult due to                            significant looping. Successful completion of the                            procedure was aided by applying abdominal pressure.                            The patient tolerated the procedure well. The                            quality of the bowel preparation was good. The                            bowel preparation used was Miralax via split dose                            instruction. Scope In: 2:19:46 PM Scope Out: 2:36:01 PM Scope Withdrawal Time: 0 hours 11 minutes 11 seconds  Total Procedure Duration: 0 hours 16 minutes 15 seconds  Findings:                 The perianal examination was normal.                           The digital rectal exam findings include prostate                            nodule.                           A diminutive polyp was found in the rectum. The                             polyp was sessile. The polyp was removed with a                            cold biopsy forceps. Resection and retrieval were                            complete. Verification of patient identification                            for the specimen was done. Estimated blood loss was                            minimal.                           Multiple diverticula were found in the sigmoid                            colon.  The exam was otherwise without abnormality on                            direct and retroflexion views. Complications:            No immediate complications. Estimated Blood Loss:     Estimated blood loss was minimal. Impression:               - A prostate nodule found on digital rectal exam.                           - One diminutive polyp in the rectum, removed with                            a cold biopsy forceps. Resected and retrieved.                           - Diverticulosis in the sigmoid colon.                           - The examination was otherwise normal on direct                            and retroflexion views. Recommendation:           - Patient has a contact number available for                            emergencies. The signs and symptoms of potential                            delayed complications were discussed with the                            patient. Return to normal activities tomorrow.                            Written discharge instructions were provided to the                            patient.                           - Clear liquids x 1 hour then soft foods rest of                            day. Start prior diet tomorrow.                           - Repeat colonoscopy is recommended. The                            colonoscopy date will be determined after pathology                            results from today's  exam become available for                            review.                            - Continue present medications.                           - KEEP UROLOGY F/U FOR PROSTATE NODULE (PREVIOUSL Y                            BIOPSIED AND BENIGN) AND ACTIVE SURVEILLANCE OF                            PROPSTATE CANCER Gatha Mayer, MD 12/30/2018 2:55:24 PM This report has been signed electronically.

## 2018-12-30 NOTE — Progress Notes (Signed)
Called to room to assist during endoscopic procedure.  Patient ID and intended procedure confirmed with present staff. Received instructions for my participation in the procedure from the performing physician.  

## 2018-12-30 NOTE — Op Note (Addendum)
[place] Patient Name: [name] Procedure Date: 12/30/2018 2:06 PM MRN: [id number] Endoscopist: Gatha Mayer , MD Age: 68 Referring MD:  Date of Birth: August 06, 1950 Gender: Male Account #: [id number] Procedure:                Upper GI endoscopy Indications:              Dysphagia Medicines:                Propofol per Anesthesia, Monitored Anesthesia Care Procedure:                Pre-Anesthesia Assessment:                           - Prior to the procedure, a History and Physical                            was performed, and patient medications and                            allergies were reviewed. The patient's tolerance of                            previous anesthesia was also reviewed. The risks                            and benefits of the procedure and the sedation                            options and risks were discussed with the patient.                            All questions were answered, and informed consent                            was obtained. Prior Anticoagulants: The patient has                            taken no previous anticoagulant or antiplatelet                            agents. ASA Grade Assessment: II - A patient with                            mild systemic disease. After reviewing the risks                            and benefits, the patient was deemed in                            satisfactory condition to undergo the procedure.                           After obtaining informed consent, the endoscope was  passed under direct vision. Throughout the                            procedure, the patient's blood pressure, pulse, and                            oxygen saturations were monitored continuously. The                            Endoscope was introduced through the mouth, and                            advanced to the second part of duodenum. The upper                            GI endoscopy was  accomplished without difficulty.                            The patient tolerated the procedure well. Scope In: Scope Out: Findings:                 One non-bleeding cratered gastric ulcer with no                            stigmata of bleeding was found on the posterior                            wall of the gastric antrum. The lesion was 8 mm in                            largest dimension. Biopsies were taken with a cold                            forceps for histology. Verification of patient                            identification for the specimen was done. Estimated                            blood loss was minimal.                           Patchy moderate inflammation characterized by                            erosions, erythema, friability and granularity was                            found in the gastric antrum.                           One benign-appearing, intrinsic mild stenosis was                            found  in the distal esophagus. The stenosis was                            traversed. The scope was withdrawn. Dilation was                            performed with a Maloney dilator with mild                            resistance at 37 Fr. The dilation site was examined                            following endoscope reinsertion and showed mild                            mucosal disruption. Estimated blood loss was                            minimal.                           The exam was otherwise without abnormality.                           The cardia and gastric fundus were normal on                            retroflexion. Complications:            No immediate complications. Estimated Blood Loss:     Estimated blood loss was minimal. Impression:               - Non-bleeding gastric ulcer with no stigmata of                            bleeding. Biopsied.                           - Gastritis.                           - Benign-appearing esophageal stenosis.  Dilated.                           - The examination was otherwise normal. Recommendation:           - Patient has a contact number available for                            emergencies. The signs and symptoms of potential                            delayed complications were discussed with the                            patient. Return to normal activities tomorrow.  Written discharge instructions were provided to the                            patient.                           - Clear liquids x 1 hour then soft foods rest of                            day. Start prior diet tomorrow.                           - Continue present medications.                           - Await pathology results.                           - See the other procedure note for documentation of                            additional recommendations.                           - Use Prilosec (omeprazole) 40 mg PO daily. Have                            asked him to hold naproxen for now - use                            acetaminophen - once on PPI for a few weeks could                            use naproxen again I think Gatha Mayer, MD 12/30/2018 2:52:04 PM This report has been signed electronically.

## 2019-01-01 ENCOUNTER — Telehealth: Payer: Self-pay

## 2019-01-01 NOTE — Telephone Encounter (Signed)
  Follow up Call-     Patient questions:  Do you have a fever, pain , or abdominal swelling? No. Pain Score  0 *  Have you tolerated food without any problems? Yes.    Have you been able to return to your normal activities? Yes.    Do you have any questions about your discharge instructions: Diet   No. Medications  No. Follow up visit  No.  Do you have questions or concerns about your Care? No.  Actions: * If pain score is 4 or above: No action needed, pain <4.  1. Have you developed a fever since your procedure? No.  2.   Have you had an respiratory symptoms (SOB or cough) since your procedure? No.  3.   Have you tested positive for COVID 19 since your procedure No.  4.   Have you had any family members/close contacts diagnosed with the COVID 19 since your procedure?  No.   If yes to any of these questions please route to Joylene John, RN and Alphonsa Gin, RN.

## 2019-01-07 ENCOUNTER — Encounter: Payer: Self-pay | Admitting: Internal Medicine

## 2019-01-07 DIAGNOSIS — T39395A Adverse effect of other nonsteroidal anti-inflammatory drugs [NSAID], initial encounter: Secondary | ICD-10-CM | POA: Insufficient documentation

## 2019-01-07 DIAGNOSIS — K259 Gastric ulcer, unspecified as acute or chronic, without hemorrhage or perforation: Secondary | ICD-10-CM | POA: Insufficient documentation

## 2019-01-07 NOTE — Progress Notes (Signed)
Gastric ulcer benign - stay on PPI - no recall Rectal polyp not pre-cancerous - no recall  Letter to patient

## 2019-01-15 DIAGNOSIS — M961 Postlaminectomy syndrome, not elsewhere classified: Secondary | ICD-10-CM | POA: Diagnosis not present

## 2019-01-15 DIAGNOSIS — Z79891 Long term (current) use of opiate analgesic: Secondary | ICD-10-CM | POA: Diagnosis not present

## 2019-01-15 DIAGNOSIS — M5030 Other cervical disc degeneration, unspecified cervical region: Secondary | ICD-10-CM | POA: Diagnosis not present

## 2019-01-18 ENCOUNTER — Encounter: Payer: Self-pay | Admitting: Family Medicine

## 2019-01-26 ENCOUNTER — Telehealth: Payer: Self-pay

## 2019-01-26 NOTE — Telephone Encounter (Signed)
[place] Night - Client Nonclinical Telephone Record AccessNurse Client [place] Night - Client Client Site [place] Physician Ria Bush - MD Contact Type Call Who Is Calling Patient / Member / Family / Caregiver Caller Name [place] Phone Number [phone] Patient Name [name] Patient DOB Oct 07, 1950 Call Type Message Only Information Provided Reason for Call Request for General Office Information Initial Comment Caller states that she would like to speak to Dr. Danise Mina. Declined triage. Additional Comment Call Closed By: Windy Canny Transaction Date/Time: 01/26/2019 8:05:47 AM (ET)

## 2019-01-26 NOTE — Telephone Encounter (Signed)
I spoke with pt and he wanted to schedule high dose flushot;pt requested same flu shot that has been getting. 03/05/2018 pt received high dose flu shot.pt scheduled flu shot clinic on 02/03/19 at 8:45. Pt has no covid symptoms, no travel and no known exposure to + covid. Car instructions given. If pt develops any respiratory or covid symptoms will cb prior to appt. Nothing further needed.

## 2019-01-28 ENCOUNTER — Other Ambulatory Visit: Payer: Self-pay | Admitting: Pharmacist

## 2019-01-28 NOTE — Patient Outreach (Signed)
[place] [facility]) Care Management [place]  01/28/2019  [name] 1951/04/13 [id number]  Incoming call and voicemail from [name].  Successful return call to patient.  Patient reports having issues with current living situation, very unhappy with apartment management.  He states he has maggots in his bedroom and office staff not helping him.  He is requesting housing resources as he wishes to relocate.   Plan: Referral to [facility] social worker to provide housing resources to patient  Ralene Bathe, PharmD, [place] [phone]

## 2019-01-29 ENCOUNTER — Other Ambulatory Visit: Payer: Self-pay

## 2019-01-29 NOTE — Patient Outreach (Signed)
[place] [facility]) Care Management  01/29/2019  [name] November 04, 1950 [id number]   Social work referral received from [place], Ralene Bathe, to contact patient regarding housing resources.  Unsuccessful outreach to patient today.  Left voicemail message and mailed Unsuccessful Outreach Letter.  Will attempt to reach again within four business days.  [name], BSW Social Worker [phone]

## 2019-02-02 ENCOUNTER — Other Ambulatory Visit: Payer: Self-pay

## 2019-02-02 ENCOUNTER — Ambulatory Visit: Payer: Self-pay

## 2019-02-02 NOTE — Patient Outreach (Signed)
[place] [facility]) Care Management  02/02/2019  JAYDEM DITOMASO 10-19-1950 [id number]   Social work referral received from [place], Ralene Bathe, to contact patient regarding housing resources.  Second unsuccessful outreach to patient today.  Left voicemail message.   Mailed Unsuccessful Outreach Letter on 01/29/19.  Will attempt to reach again within four business days.  [name], BSW Social Worker [phone]

## 2019-02-03 ENCOUNTER — Ambulatory Visit (INDEPENDENT_AMBULATORY_CARE_PROVIDER_SITE_OTHER): Payer: Medicare Other

## 2019-02-03 DIAGNOSIS — Z23 Encounter for immunization: Secondary | ICD-10-CM | POA: Diagnosis not present

## 2019-02-04 ENCOUNTER — Ambulatory Visit: Payer: Self-pay

## 2019-02-04 ENCOUNTER — Other Ambulatory Visit: Payer: Self-pay

## 2019-02-04 NOTE — Patient Outreach (Signed)
[place] [facility]) Care Management  02/04/2019  [name] 09-04-1950 [id number]   Social work referral received from [place], Ralene Bathe, to contact patient regarding housing resources. Third unsuccessful outreach to patient today. Left voicemail message.   Mailed Unsuccessful Outreach Letter on 01/29/19. Will close case if no response by 02/11/19.  [name], BSW Social Worker [phone]

## 2019-02-11 ENCOUNTER — Other Ambulatory Visit: Payer: Self-pay

## 2019-02-11 NOTE — Patient Outreach (Signed)
[place] [facility]) Care Management  02/11/2019  [name] 1951-01-01 [id number]  [facility] case closure due to inability to contact.  [name], BSW Social Worker [phone]

## 2019-02-23 ENCOUNTER — Other Ambulatory Visit: Payer: Self-pay | Admitting: Family Medicine

## 2019-02-23 NOTE — Telephone Encounter (Signed)
Last office visit 10/28/2018 for CPE.  Last refilled 07/28/2018 for #90 with 1 refill.  Next Appt: 04/30/2019 for 6 month follow up.

## 2019-02-25 ENCOUNTER — Other Ambulatory Visit: Payer: Self-pay

## 2019-02-25 ENCOUNTER — Emergency Department (HOSPITAL_COMMUNITY): Payer: Medicare Other

## 2019-02-25 ENCOUNTER — Telehealth: Payer: Self-pay

## 2019-02-25 ENCOUNTER — Emergency Department (HOSPITAL_COMMUNITY)
Admission: EM | Admit: 2019-02-25 | Discharge: 2019-02-25 | Discharge status: Against Medical Advice | Payer: Medicare Other | Attending: Emergency Medicine | Admitting: Emergency Medicine

## 2019-02-25 DIAGNOSIS — I2510 Atherosclerotic heart disease of native coronary artery without angina pectoris: Secondary | ICD-10-CM | POA: Diagnosis not present

## 2019-02-25 DIAGNOSIS — R0789 Other chest pain: Secondary | ICD-10-CM | POA: Insufficient documentation

## 2019-02-25 DIAGNOSIS — Z79899 Other long term (current) drug therapy: Secondary | ICD-10-CM | POA: Insufficient documentation

## 2019-02-25 DIAGNOSIS — I10 Essential (primary) hypertension: Secondary | ICD-10-CM | POA: Diagnosis not present

## 2019-02-25 DIAGNOSIS — Z7982 Long term (current) use of aspirin: Secondary | ICD-10-CM | POA: Diagnosis not present

## 2019-02-25 DIAGNOSIS — Z7984 Long term (current) use of oral hypoglycemic drugs: Secondary | ICD-10-CM | POA: Insufficient documentation

## 2019-02-25 DIAGNOSIS — E119 Type 2 diabetes mellitus without complications: Secondary | ICD-10-CM | POA: Diagnosis not present

## 2019-02-25 DIAGNOSIS — R079 Chest pain, unspecified: Secondary | ICD-10-CM | POA: Diagnosis not present

## 2019-02-25 DIAGNOSIS — Z8673 Personal history of transient ischemic attack (TIA), and cerebral infarction without residual deficits: Secondary | ICD-10-CM | POA: Insufficient documentation

## 2019-02-25 LAB — CBC
HCT: 44.8 % (ref 39.0–52.0)
Hemoglobin: 14.7 g/dL (ref 13.0–17.0)
MCH: 30.1 pg (ref 26.0–34.0)
MCHC: 32.8 g/dL (ref 30.0–36.0)
MCV: 91.6 fL (ref 80.0–100.0)
Platelets: 298 10*3/uL (ref 150–400)
RBC: 4.89 MIL/uL (ref 4.22–5.81)
RDW: 14.8 % (ref 11.5–15.5)
WBC: 5.9 10*3/uL (ref 4.0–10.5)
nRBC: 0 % (ref 0.0–0.2)

## 2019-02-25 LAB — TROPONIN I (HIGH SENSITIVITY): Troponin I (High Sensitivity): 7 ng/L (ref <18)

## 2019-02-25 LAB — BASIC METABOLIC PANEL
Anion gap: 10 (ref 5–15)
BUN: 16 mg/dL (ref 8–23)
CO2: 27 mmol/L (ref 22–32)
Calcium: 9.5 mg/dL (ref 8.9–10.3)
Chloride: 104 mmol/L (ref 98–111)
Creatinine, Ser: 0.84 mg/dL (ref 0.61–1.24)
GFR calc Af Amer: >60 mL/min (ref >60)
GFR calc non Af Amer: >60 mL/min (ref >60)
Glucose, Bld: 145 mg/dL — ABNORMAL HIGH (ref 70–99)
Potassium: 3.8 mmol/L (ref 3.5–5.1)
Sodium: 141 mmol/L (ref 135–145)

## 2019-02-25 MED ORDER — OXYCODONE-ACETAMINOPHEN 5-325 MG PO TABS
1.0000 | ORAL_TABLET | ORAL | Status: DC | PRN
  Administered 2019-02-25: 1 via ORAL
  Filled 2019-02-25: qty 1

## 2019-02-25 MED ORDER — SODIUM CHLORIDE 0.9% FLUSH: 3.0000 mL | Freq: Once | INTRAVENOUS | Status: DC

## 2019-02-25 NOTE — ED Triage Notes (Signed)
Patient reports to the ED with c/o chest pain. Patient reports he the pain has been going back and forth across his chest and he "has a touch of cancer" in his prostate that is being monitored but now his legs have started hurting. Patient also reports elevated BP and HR.

## 2019-02-25 NOTE — Telephone Encounter (Signed)
Pt said on 02/24/19 BP 150/77 P 105 and pt had left sided chest pain that radiated to lt arm. Pt had lt sided CP earlier this morning but none now.earlier today BP 134/87 P 116. Pt said he is sitting now and sweating profusely. BP now 168/104 P 113 but no CP and no SOB. Pt took Losartan 50 mg this morning at 5 AM.pts neighbor will take pt to [facility] ED now by car. Pt request a call ahead to let ED triage know he should be there in 30'.Advised pt would call [facility] ED. I called [facility] ED charge nurse Tim and notified above info and pt is on his way by car with arrival time 15-20'. fYI to Dr Darnell Level.

## 2019-02-25 NOTE — ED Provider Notes (Signed)
[facility] DEPT Provider Note   CSN: [id number] Arrival date & time: 02/25/19  1619     History   Chief Complaint Chief Complaint  Patient presents with  . Chest Pain  . Hypertension    HPI YASEEN REIFEL is a 68 y.o. male.     Patient states that his blood pressure is elevated today and he also states he has been having chest discomfort off and on for many weeks.  He is not having any pain now  The history is provided by the patient. No language interpreter was used.  Chest Pain Pain location:  L chest Pain quality: aching   Pain radiates to:  Does not radiate Pain severity:  Mild Onset quality:  Gradual Timing:  Intermittent Progression:  Waxing and waning Chronicity:  New Context: not breathing   Associated symptoms: no abdominal pain, no back pain, no cough, no fatigue and no headache   Hypertension Associated symptoms include chest pain. Pertinent negatives include no abdominal pain and no headaches.    Past Medical History:  Diagnosis Date  . Abdominal wall hernia   . Anxiety   . Back pain    with radiculopathy  . Bladder wall thickening 2013   nl rpt CT scan, normal cytology and UCx, released from urology Karsten Ro)  . Chest pain, non-cardiac 2008   adenosine myoview low risk.  EF 60%.  normal cath 2008, deemed atypical /noncardiac, ?discogenic  . Chronic neck pain   . Chronic pain    narcotic dependent ([place])  . Circadian rhythm sleep disorder, irregular sleep-wake type   . Complication of anesthesia    hard to put patient to sleep  . Depression   . Diverticulosis 2013   by CT scan  . Esophageal stricture 06/2007   dilation of stricture  . GERD (gastroesophageal reflux disease)   . Gross hematuria 2013, 2016   negative CT, cystoscopy, cytology, 2016 - thought from E coli UTI s/p appropriate treatment ([place])  . History of cocaine use 2004  . History of epididymitis   . History of prostatitis 08/2011   ?chronic/[place]ing, treated with 1 mo cipro  . Hyperlipidemia   . Hypertension    Essential  . NSAID-induced gastric ulcer 01/07/2019  . OSA (obstructive sleep apnea) 2010   mild off CPAP  . Postlaminectomy syndrome of cervical region    s/p ACDF C5-7 2010 ([place])  . Prostate cancer ([place]) 06/16/2012   T2a, Gleason 6 prostate cancer found by R apical nodule with PSA 2.18 Karsten Ro, [place]) 04/2016  . T2DM (type 2 diabetes mellitus) ([place])   . TIA (transient ischemic attack)    cocaine related    Patient Active Problem List   Diagnosis Date Noted  . NSAID-induced gastric ulcer 01/07/2019  . Pain due to onychomycosis of toenails of both feet 10/15/2018  . CAD (coronary artery disease) 09/03/2018  . Abnormal stress test   . Chest pain 08/25/2018  . Tongue lesion 08/05/2018  . Fatty liver 07/31/2018  . Right sided abdominal pain 07/28/2018  . Right arm pain 07/28/2018  . Lightheadedness 08/06/2017  . Erectile dysfunction 08/01/2015  . Health maintenance examination 01/31/2015  . Gross hematuria   . Lipoma of neck 10/01/2014  . Allergic rhinitis 10/01/2014  . Diabetic neuropathy ([place]) 05/31/2014  . Advanced care planning/counseling discussion 01/26/2014  . Cough 06/24/2013  . Lower abdominal pain 04/20/2013  . OSA (obstructive sleep apnea)   . Tinea cruris 10/08/2012  . Skin rash 10/01/2012  .  Prostate cancer ([place]) 06/16/2012  . Headache(784.0) 02/12/2012  . Severe obesity (BMI 35.0-39.9) with comorbidity ([place]) 01/08/2012  . Esophageal dysphagia 10/10/2011  . Diverticulosis 10/09/2011  . 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 Insomnia 06/19/2011  . Shoulder pain 04/08/2011  . Neck pain, chronic 02/24/2011  . Medicare annual wellness visit, subsequent 10/26/2010  . Iron deficiency anemia 07/01/2009  . GERD 09/29/2008  . Essential hypertension 01/01/2008  . ANXIETY DEPRESSION 03/19/2007  . TIA (transient ischemic attack) 03/05/2007  . Back pain with radiculopathy 12/31/2006   . ABDOMINAL WALL HERNIA 10/03/2006  . Diabetes mellitus type 2, uncontrolled, with complications ([place]) [id number]  . COCAINE ABUSE, HX OF 10/01/2006  . HYPERCHOLESTEROLEMIA, PURE 08/21/2005    Past Surgical History:  Procedure Laterality Date  . ANTERIOR CERVICAL DISCECTOMY  10/28/2008   with fusion (Dr. Rolena Infante)  . CARDIAC CATHETERIZATION  2008   WNL   . CARPAL TUNNEL RELEASE     Right  . CATARACT EXTRACTION Bilateral 12/2014, 01/2015   Dr Tommy Rainwater  . COLONOSCOPY  10/11/2011   severe diverticulosis, int hemorrhoids, rec rpt 5 yrs Carlean Purl)  . COLONOSCOPY  12/2018   benign rectal polyp Carlean Purl)  . DIRECT LARYNGOSCOPY  11/02/2008   Extubation under anesthesia (Dr. Erik Obey)  . ESOPHAGOGASTRODUODENOSCOPY  07/17/07   Esoph. Stricture dilated (Dr. Henrene Pastor)  . ESOPHAGOGASTRODUODENOSCOPY  10/11/2011   WNL Carlean Purl)  . ESOPHAGOGASTRODUODENOSCOPY  12/2018   gastric ulcer, gastritis, dilated esophageal stenosis, limit NSAIDs continue PPI, no f/u planned Carlean Purl)  . EXCISION MASS NECK N/A 08/29/2016   Procedure: EXCISION MASS POSTERIOR NECK;  Surgeon: Erroll Luna, MD;  Location: [facility];  Service: General;  Laterality: N/A;  . HEMATOMA EVACUATION  10/29/2008   (Dr. Rolena Infante)  . INGUINAL HERNIA REPAIR     bilateral, 5 cm. RLQ Abd Lipoma Excision (Dr. Lucia Gaskins)  . LEFT HEART CATH AND CORONARY ANGIOGRAPHY N/A 08/29/2018   Procedure: LEFT HEART CATH AND CORONARY ANGIOGRAPHY;  Surgeon: Jettie Booze, MD;  Location: [facility] [place] [facility];  Service: Cardiovascular;  Laterality: N/A;  . LIPOMA EXCISION  08/2016   posterior neck  . SHOULDER ARTHROSCOPY WITH SUBACROMIAL DECOMPRESSION AND OPEN ROTATOR C Left 01/23/2013   Procedure: LEFT SHOULDER ARTHROSCOPY WITH SUBACROMIAL DECOMPRESSION AND MINI OPEN ROTATOR CUFF REPAIR, AND OPEN DISTAL CLAVICLE RESECTION;  Surgeon: Augustin Schooling, MD;  Location: [facility];  Service: Orthopedics;  Laterality: Left;  . SHOULDER SURGERY Right 2013    replaced  . TONSILLECTOMY  childhood  . TRIGGER FINGER RELEASE     Right long finger        Home Medications    Prior to Admission medications   Medication Sig Start Date End Date Taking? Authorizing Provider  aspirin EC 81 MG tablet Take 81 mg by mouth daily. 10/25/11  Yes Thurnell Lose, MD  atorvastatin (LIPITOR) 80 MG tablet Take 1 tablet (80 mg total) by mouth daily. 10/28/18 02/25/19 Yes Ria Bush, MD  baclofen (LIORESAL) 10 MG tablet Take 1 tablet (10 mg total) by mouth at bedtime as needed for muscle spasms. 02/25/19  Yes Ria Bush, MD  empagliflozin (JARDIANCE) 10 MG TABS tablet Take 10 mg by mouth daily. 10/28/18  Yes Ria Bush, MD  gabapentin (NEURONTIN) 100 MG capsule TAKE 1 CAPSULE BY MOUTH TWO TIMES DAILY Patient taking differently: Take 100 mg by mouth 2 (two) times daily. TAKE 1 CAPSULE BY MOUTH TWO TIMES DAILY 10/28/18  Yes Ria Bush, MD  glipiZIDE (GLUCOTROL XL) 10 MG 24 hr tablet  Take 1 tablet (10 mg total) by mouth daily with breakfast. 10/28/18  Yes Ria Bush, MD  losartan (COZAAR) 50 MG tablet Take 1 tablet (50 mg total) by mouth daily. 10/28/18  Yes Ria Bush, MD  metFORMIN (GLUCOPHAGE) 1000 MG tablet Take 1 tablet (1,000 mg total) by mouth 2 (two) times daily with a meal. 10/28/18  Yes Ria Bush, MD  naproxen (NAPROSYN) 375 MG tablet TAKE 1 TABLET BY MOUTH  TWICE DAILY AS NEEDED WITH  A MEAL 10/23/18  Yes Ria Bush, MD  omeprazole (PRILOSEC) 40 MG capsule Take 1 capsule (40 mg total) by mouth daily. 12/30/18  Yes Gatha Mayer, MD  oxyCODONE-acetaminophen (PERCOCET) 10-325 MG tablet Take 1 tablet by mouth every 4 (four) hours as needed for pain.    Yes [provider]  fluticasone (FLONASE) 50 MCG/ACT nasal spray USE 2 SPRAYS IN EACH  NOSTRIL DAILY Patient not taking: No sig reported 03/27/17   Ria Bush, MD  glucose blood (ONE TOUCH ULTRA TEST) test strip USE WITH METER TO CHECK  SUGAR TWICE DAILY.  03/10/18   Ria Bush, MD    Family History Family History  Problem Relation Age of Onset  . Diabetes Mother   . Hypertension Mother   . Hyperlipidemia Mother   . Diabetes Father   . Diabetes Sister   . Hypertension Sister   . Hypertension Brother   . Diabetes Brother   . Diabetes Brother   . Hypertension Brother   . Diabetes Brother   . Heart disease Maternal Uncle        Heart failure  . Cancer Paternal Uncle        Prostate  . Cancer Paternal Grandfather        Prostate  . Stroke Neg Hx   . Colon cancer Neg Hx   . Esophageal cancer Neg Hx   . Rectal cancer Neg Hx   . Stomach cancer Neg Hx     Social History Social History   Tobacco Use  . Smoking status: Never Smoker  . Smokeless tobacco: Never Used  Substance Use Topics  . Alcohol use: No    Alcohol/week: 0.0 standard drinks  . Drug use: No    Comment: H/O marijuana (last 2005); Cocaine 5/04     Allergies   Gabapentin, Glimepiride, and Menthol (topical analgesic)   Review of Systems Review of Systems  Constitutional: Negative for appetite change and fatigue.  HENT: Negative for congestion, ear discharge and sinus pressure.   Eyes: Negative for discharge.  Respiratory: Negative for cough.   Cardiovascular: Positive for chest pain.  Gastrointestinal: Negative for abdominal pain and diarrhea.  Genitourinary: Negative for frequency and hematuria.  Musculoskeletal: Negative for back pain.  Skin: Negative for rash.  Neurological: Negative for seizures and headaches.  Psychiatric/Behavioral: Negative for hallucinations.     Physical Exam Updated Vital Signs BP 121/83 (BP Location: Left Arm)   Pulse 85   Temp 99 F (37.2 C)   Resp 16   SpO2 98%   Physical Exam Vitals signs and nursing note reviewed.  Constitutional:      Appearance: He is well-developed.  HENT:     Head: Normocephalic.     Nose: Nose normal.  Eyes:     General: No scleral icterus.    Conjunctiva/sclera: Conjunctivae  normal.  Neck:     Musculoskeletal: Neck supple.     Thyroid: No thyromegaly.  Cardiovascular:     Rate and Rhythm: Normal rate and regular rhythm.  Heart sounds: No murmur. No friction rub. No gallop.   Pulmonary:     Breath sounds: No stridor. No wheezing or rales.  Chest:     Chest wall: No tenderness.  Abdominal:     General: There is no distension.     Tenderness: There is no abdominal tenderness. There is no rebound.  Musculoskeletal: Normal range of motion.  Lymphadenopathy:     Cervical: No cervical adenopathy.  Skin:    Findings: No erythema or rash.  Neurological:     Mental Status: He is alert and oriented to person, place, and time.     Motor: No abnormal muscle tone.     Coordination: Coordination normal.  Psychiatric:        Behavior: Behavior normal.      ED Treatments / Results  Labs (all labs ordered are listed, but only abnormal results are displayed) Labs Reviewed  BASIC METABOLIC PANEL - Abnormal; Notable for the following components:      Result Value   Glucose, Bld 145 (*)    All other components within normal limits  CBC  TROPONIN I (HIGH SENSITIVITY)    EKG EKG Interpretation  Date/Time:  Wednesday February 25 2019 16:24:39 EST Ventricular Rate:  114 PR Interval:    QRS Duration: 91 QT Interval:  306 QTC Calculation: 422 R Axis:   5 Text Interpretation: Sinus tachycardia Probable left atrial enlargement Low voltage, precordial leads Abnormal inferior Q waves Consider anterior infarct Confirmed by Milton Ferguson [phone]) on 02/25/2019 10:05:41 PM   Radiology Dg Chest 2 View  Result Date: 02/25/2019 CLINICAL DATA:  Chest pain EXAM: CHEST - 2 VIEW COMPARISON:  08/25/2018 FINDINGS: Surgical hardware in the cervicothoracic spine. No consolidation or effusion. Normal heart size. No pneumothorax. Degenerative changes of the spine. IMPRESSION: No active cardiopulmonary disease. Electronically Signed   By: Donavan Foil M.D.   On: 02/25/2019  19:29    Procedures Procedures (including critical care time)  Medications Ordered in ED Medications  sodium chloride flush (NS) 0.9 % injection 3 mL (has no administration in time range)  oxyCODONE-acetaminophen (PERCOCET/ROXICET) 5-325 MG per tablet 1 tablet (1 tablet Oral Given 02/25/19 1810)     Initial Impression / Assessment and Plan / ED Course  I have reviewed the triage vital signs and the nursing notes.  Pertinent labs & imaging results that were available during my care of the patient were reviewed by me and considered in my medical decision making (see chart for details).        Labs EKG and chest x-ray unremarkable.  Doubt coronary artery disease.  Patient with atypical chest pain.  He will be referred to cardiology for consideration of further work-up  Final Clinical Impressions(s) / ED Diagnoses   Final diagnoses:  Atypical chest pain    ED Discharge Orders    None       Milton Ferguson, MD 02/25/19 2243

## 2019-02-25 NOTE — Telephone Encounter (Signed)
Noted. Will await ER eval.  

## 2019-02-25 NOTE — Discharge Instructions (Addendum)
Make appointment with Dr. Tilda Franco or one of his partners in the next couple weeks

## 2019-02-25 NOTE — ED Notes (Signed)
Pt told RN that he needed to leave because his ride was here. MD notified.

## 2019-03-02 ENCOUNTER — Other Ambulatory Visit: Payer: Self-pay

## 2019-03-02 ENCOUNTER — Ambulatory Visit: Payer: Medicare Other | Admitting: Cardiology

## 2019-03-02 ENCOUNTER — Encounter: Payer: Self-pay | Admitting: Cardiology

## 2019-03-02 VITALS — BP 130/66 | HR 80 | Ht 67.0 in | Wt 228.0 lb

## 2019-03-02 DIAGNOSIS — I2510 Atherosclerotic heart disease of native coronary artery without angina pectoris: Secondary | ICD-10-CM | POA: Diagnosis not present

## 2019-03-02 DIAGNOSIS — E7800 Pure hypercholesterolemia, unspecified: Secondary | ICD-10-CM | POA: Diagnosis not present

## 2019-03-02 DIAGNOSIS — R079 Chest pain, unspecified: Secondary | ICD-10-CM | POA: Diagnosis not present

## 2019-03-02 DIAGNOSIS — I10 Essential (primary) hypertension: Secondary | ICD-10-CM

## 2019-03-02 DIAGNOSIS — I2583 Coronary atherosclerosis due to lipid rich plaque: Secondary | ICD-10-CM

## 2019-03-02 LAB — D-DIMER, QUANTITATIVE: D-DIMER: 0.4 mg/L FEU (ref 0.00–0.49)

## 2019-03-02 NOTE — Patient Instructions (Addendum)
Medication Instructions:    Your physician recommends that you continue on your current medications as directed. Please refer to the Current Medication list given to you today.  *If you need a refill on your cardiac medications before your next appointment, please call your pharmacy*   Lab Work:  [facility]  If you have labs (blood work) drawn today and your tests are completely normal, you will receive your results only by: Marland Kitchen MyChart Message (if you have MyChart) OR . A paper copy in the mail If you have any lab test that is abnormal or we need to change your treatment, we will call you to review the results.   Testing/Procedures:  Your physician has requested that you have an echocardiogram. Echocardiography is a painless test that uses sound waves to create images of your heart. It provides your doctor with information about the size and shape of your heart and how well your heart's chambers and valves are working. This procedure takes approximately one hour. There are no restrictions for this procedure.    Follow-Up: At [name], you and your health needs are our priority.  As part of our continuing mission to provide you with exceptional heart care, we have created designated Provider Care Teams.  These Care Teams include your primary Cardiologist (physician) and Advanced Practice Providers (APPs -  Physician Assistants and Nurse Practitioners) who all work together to provide you with the care you need, when you need it.  Your next appointment:   6 months  The format for your next appointment:   In Person  Provider:   Jenkins Rouge, MD

## 2019-03-02 NOTE — Progress Notes (Signed)
Cardiology Office Note    Date:  03/02/2019   ID:  CHIMAOBI GARGUS, DOB March 30, 1951, MRN [id number]  PCP:  Ria Bush, MD  Cardiologist:  Fransico Him, MD   Chief Complaint  Patient presents with  . Coronary Artery Disease  . Hypertension  . Hyperlipidemia  . Chest Pain    History of Present Illness:  NOLAND LANIGAN is a 68 y.o. male who is being seen today for the evaluation of Chest pain at the request of Ria Bush, MD.  This is a 68yo AAM with a long hx of chest pain with normal coronary arteries in 2008 and felt to be due to noncardiac etiology and had been lost to followup with Dr. Johnsie Cancel.  He was admitted earlier this year with CP in May and underwent repeat cath showing  nobstructive CAD with 25% LAD.  He has a hx of HTN, type 2 DM, cocaine abuse, HLD.  He was recently seen in the ER with recurrent CP that he describes as a sharp pain in the left chest, left arm and left leg.  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  He only had 1 episode of CP in the setting of hypertensive urgency. Trop was neg x 1 and EKG nonischemic.  He was referred from ER for further cardiac followup.   He has not had any CP since the other day.  He thinks the pain is likely related to his cervical spine disease.  He denies any  SOB, DOE, PND, orthopnea, LE edema, dizziness, palpitations or syncope. He is compliant with his meds and is tolerating meds with no SE.      Past Medical History:  Diagnosis Date  . Abdominal pain, epigastric 10/28/2009   Qualifier: Diagnosis of  By: Surface RN, Butch Penny    . Abdominal wall hernia   . ABDOMINAL WALL HERNIA 10/03/2006   Qualifier: Diagnosis of  Problem Stop Reason:  By: Council Mechanic MD, Hilaria Ota   . Abnormal stress test   . Allergic rhinitis 10/01/2014  . Anxiety   . ANXIETY DEPRESSION 03/19/2007  . Back pain    with radiculopathy  . Back pain with radiculopathy 12/31/2006    Qualifier: Diagnosis of  By: Council Mechanic MD, Hilaria Ota   . Balanitis 08/06/2017  . Bladder wall thickening 2013   nl rpt CT scan, normal cytology and UCx, released from urology Karsten Ro)  . CAD (coronary artery disease) 09/03/2018   Nonobstructive 25% plaque in LAD by cath 08/2018.   Marland Kitchen Chest pain 08/25/2018  . Chest pain, non-cardiac 2008   adenosine myoview low risk.  EF 60%.  normal cath 2008, deemed atypical /noncardiac, ?discogenic  . Chronic neck pain   . Chronic pain    narcotic dependent ([place])  . Circadian rhythm sleep disorder, irregular sleep-wake type   . COCAINE ABUSE, HX OF 10/01/2006   Qualifier: Diagnosis of  By: Fuller Plan CMA (AAMA), [place]    . Complication of anesthesia    hard to put patient to sleep  . Cough 06/24/2013  . Depression   . Diabetic neuropathy ([place]) 05/31/2014  . Diverticulosis 2013   by CT scan  . DYSPNEA 09/22/2007   Qualifier: Diagnosis of  By: Council Mechanic MD, Hilaria Ota   . Edema 12/15/2010  . EPIDIDYMITIS 12/31/2006   Qualifier: Diagnosis of  By: Council Mechanic MD, Hilaria Ota   . Erectile dysfunction 08/01/2015  . Esophageal dysphagia 10/10/2011  .  Esophageal stricture 06/2007   dilation of stricture  . Essential hypertension 01/01/2008  . Fatty liver 07/31/2018   By [place] 07/2018  . FOOT PAIN, RIGHT 07/25/2007   Qualifier: Diagnosis of  By: Council Mechanic MD, Hilaria Ota   . GERD 09/29/2008  . GERD (gastroesophageal reflux disease)   . Gross hematuria 2013, 2016   negative CT, cystoscopy, cytology, 2016 - thought from E coli UTI s/p appropriate treatment ([place])  . Headache(784.0) 02/12/2012  . Hematochezia 10/09/2011  . HIP PAIN, RIGHT 03/09/2010   Qualifier: Diagnosis of  By: Damita Dunnings MD, Phillip Heal    . History of cocaine use 2004  . History of epididymitis   . History of prostatitis 08/2011   ?chronic/simmering, treated with 1 mo cipro  . HYPERCHOLESTEROLEMIA, PURE 08/21/2005   Nonobstructive CAD by cath 08/2018. Goal LDL <70.   Marland Kitchen Hyperlipidemia   . Hypertension     Essential  . Insomnia 06/19/2011  . Iron deficiency anemia 07/01/2009  . Lightheadedness 08/06/2017  . Lipoma of neck 10/01/2014   S/p excision  . Lower abdominal pain 04/20/2013  . 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 NSAID-induced gastric ulcer 01/07/2019  . OSA (obstructive sleep apnea) 2010   mild off CPAP  . Pain due to onychomycosis of toenails of both feet 10/15/2018  . Postlaminectomy syndrome of cervical region    s/p ACDF C5-7 2010 ([place])  . Prostate cancer ([place]) 06/16/2012   T2a, Gleason 6 prostate cancer found by R apical nodule with PSA 2.18 [facility] Ro, [place]) 04/2016  . Prostate cancer ([place]) 06/16/2012   T2a, Gleason 6 prostate cancer found by R apical nodule with PSA 2.18 [facility] Ro, [place]) 04/2016 Prostate MRI 07/2016 - 79m nodule R apex - thought benign adenoma Continued active surveillance planned  . Prostatitis 08/27/2011  . Rhinitis 10/26/2010  . Right arm pain 07/28/2018  . Right sided abdominal pain 07/28/2018  . Severe obesity (BMI 35.0-39.9) with comorbidity ([place]) 01/08/2012  . Shoulder pain 04/08/2011  . SINUSITIS 09/29/2008   Qualifier: Diagnosis of  By: Burnett Kanaris    . Skin rash 10/01/2012  . SKIN RASH 05/04/2010   Qualifier: Diagnosis of  By: Damita Dunnings MD, Phillip Heal    . T2DM (type 2 diabetes mellitus) ([place])   . Tachycardia 06/24/2013  . Tendonitis of ankle or foot 09/23/2013  . TIA (transient ischemic attack)    cocaine related  . Tinea cruris 10/08/2012  . Tongue lesion 08/05/2018  . URI 03/05/2007   Qualifier: Diagnosis of  By: Damita Dunnings MD, Phillip Heal      Past Surgical History:  Procedure Laterality Date  . ANTERIOR CERVICAL DISCECTOMY  10/28/2008   with fusion (Dr. Rolena Infante)  . CARDIAC CATHETERIZATION  2008   WNL   . CARPAL TUNNEL RELEASE     Right  . CATARACT EXTRACTION Bilateral 12/2014, 01/2015   Dr Tommy Rainwater  . COLONOSCOPY  10/11/2011   severe diverticulosis, int hemorrhoids, rec rpt 5 yrs Carlean Purl)  . COLONOSCOPY  12/2018   benign rectal polyp Carlean Purl)  . DIRECT  LARYNGOSCOPY  11/02/2008   Extubation under anesthesia (Dr. Erik Obey)  . ESOPHAGOGASTRODUODENOSCOPY  07/17/07   Esoph. Stricture dilated (Dr. Henrene Pastor)  . ESOPHAGOGASTRODUODENOSCOPY  10/11/2011   WNL Carlean Purl)  . ESOPHAGOGASTRODUODENOSCOPY  12/2018   gastric ulcer, gastritis, dilated esophageal stenosis, limit NSAIDs continue PPI, no f/u planned Carlean Purl)  . EXCISION MASS NECK N/A 08/29/2016   Procedure: EXCISION MASS POSTERIOR NECK;  Surgeon: Erroll Luna, MD;  Location: [place];  Service: General;  Laterality: N/A;  .  HEMATOMA EVACUATION  10/29/2008   (Dr. Rolena Infante)  . INGUINAL HERNIA REPAIR     bilateral, 5 cm. RLQ Abd Lipoma Excision (Dr. Lucia Gaskins)  . LEFT HEART CATH AND CORONARY ANGIOGRAPHY N/A 08/29/2018   Procedure: LEFT HEART CATH AND CORONARY ANGIOGRAPHY;  Surgeon: Jettie Booze, MD;  Location: [facility];  Service: Cardiovascular;  Laterality: N/A;  . LIPOMA EXCISION  08/2016   posterior neck  . SHOULDER ARTHROSCOPY WITH SUBACROMIAL DECOMPRESSION AND OPEN ROTATOR C Left 01/23/2013   Procedure: LEFT SHOULDER ARTHROSCOPY WITH SUBACROMIAL DECOMPRESSION AND MINI OPEN ROTATOR CUFF REPAIR, AND OPEN DISTAL CLAVICLE RESECTION;  Surgeon: Augustin Schooling, MD;  Location: [place];  Service: Orthopedics;  Laterality: Left;  . SHOULDER SURGERY Right 2013   replaced  . TONSILLECTOMY  childhood  . TRIGGER FINGER RELEASE     Right long finger    Current Medications: Current Meds  Medication Sig  . aspirin EC 81 MG tablet Take 81 mg by mouth daily.  . baclofen (LIORESAL) 10 MG tablet Take 1 tablet (10 mg total) by mouth at bedtime as needed for muscle spasms.  . empagliflozin (JARDIANCE) 10 MG TABS tablet Take 10 mg by mouth daily.  . fluticasone (FLONASE) 50 MCG/ACT nasal spray USE 2 SPRAYS IN EACH  NOSTRIL DAILY  . gabapentin (NEURONTIN) 100 MG capsule TAKE 1 CAPSULE BY MOUTH TWO TIMES DAILY (Patient taking differently: Take 100 mg by mouth 2 (two) times daily. TAKE 1  CAPSULE BY MOUTH TWO TIMES DAILY)  . glipiZIDE (GLUCOTROL XL) 10 MG 24 hr tablet Take 1 tablet (10 mg total) by mouth daily with breakfast.  . glucose blood (ONE TOUCH ULTRA TEST) test strip USE WITH METER TO CHECK  SUGAR TWICE DAILY.  Marland Kitchen losartan (COZAAR) 50 MG tablet Take 1 tablet (50 mg total) by mouth daily.  . metFORMIN (GLUCOPHAGE) 1000 MG tablet Take 1 tablet (1,000 mg total) by mouth 2 (two) times daily with a meal.  . naproxen (NAPROSYN) 375 MG tablet TAKE 1 TABLET BY MOUTH  TWICE DAILY AS NEEDED WITH  A MEAL  . omeprazole (PRILOSEC) 40 MG capsule Take 1 capsule (40 mg total) by mouth daily.  Marland Kitchen oxyCODONE-acetaminophen (PERCOCET) 10-325 MG tablet Take 1 tablet by mouth every 4 (four) hours as needed for pain.     Allergies:   Gabapentin, Glimepiride, and Menthol (topical analgesic)   Social History   Socioeconomic History  . Marital status: Single    Spouse name: Not on file  . Number of children: 1  . Years of education: Not on file  . Highest education level: Not on file  Occupational History  . Occupation: [name] previously    Fish farm manager: UNEMPLOYED    Comment: Now on disability after neck surgery  Social Needs  . Financial resource strain: Not on file  . Food insecurity    Worry: Not on file    Inability: Not on file  . Transportation needs    Medical: Not on file    Non-medical: Not on file  Tobacco Use  . Smoking status: Never Smoker  . Smokeless tobacco: Never Used  Substance and Sexual Activity  . Alcohol use: No    Alcohol/week: 0.0 standard drinks  . Drug use: No    Comment: H/O marijuana (last 2005); Cocaine 5/04  . Sexual activity: Not Currently  Lifestyle  . Physical activity    Days per week: Not on file    Minutes per session: Not on file  .  Stress: Not on file  Relationships  . Social Herbalist on phone: Not on file    Gets together: Not on file    Attends religious service: Not on file    Active member of club or  organization: Not on file    Attends meetings of clubs or organizations: Not on file    Relationship status: Not on file  Other Topics Concern  . 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   Activity: no regular exercise, wants to start walking   Diet: good water, fruits/vegetables daily.      Retired   1 daughter and 1 granddaughter     Family History:  The patient's family history includes Cancer in his paternal grandfather and paternal uncle; Diabetes in his brother, brother, brother, father, mother, and sister; Heart disease in his maternal uncle; Hyperlipidemia in his mother; Hypertension in his brother, brother, mother, and sister.   ROS:   Please see the history of present illness.    ROS All other systems reviewed and are negative.  No flowsheet data found.     PHYSICAL EXAM:   VS:  BP 130/66   Pulse 80   Ht 5\' 7"  (1.702 m)   Wt 228 lb (103.4 kg)   SpO2 97%   BMI 35.71 kg/m    GEN: Well nourished, well developed, in no acute distress  HEENT: normal  Neck: no JVD, carotid bruits, or masses Cardiac: RRR; no murmurs, rubs, or gallops,no edema.  Intact distal pulses bilaterally.  Respiratory:  clear to auscultation bilaterally, normal work of breathing GI: soft, nontender, nondistended, + BS MS: no deformity or atrophy  Skin: warm and dry, no rash Neuro:  Alert and Oriented x 3, Strength and sensation are intact Psych: euthymic mood, full affect  Wt Readings from Last 3 Encounters:  03/02/19 228 lb (103.4 kg)  12/30/18 231 lb (104.8 kg)  11/27/18 231 lb (104.8 kg)      Studies/Labs Reviewed:   EKG:  EKG is not ordered today  Recent Labs: 10/16/2018: ALT 23 02/25/2019: BUN 16; Creatinine, Ser 0.84; Hemoglobin 14.7; Platelets 298; Potassium 3.8; Sodium 141   Lipid Panel    Component  Value Date/Time   CHOL 95 10/16/2018 1125   TRIG 70.0 10/16/2018 1125   HDL 35.50 (L) 10/16/2018 1125   CHOLHDL 3 10/16/2018 1125   VLDL 14.0 10/16/2018 1125   LDLCALC 45 10/16/2018 1125   LDLDIRECT 104 (H) 12/26/2012 1443    Additional studies/ records that were reviewed today include:  Hospital notes from ER    ASSESSMENT:    1. Chest pain of uncertain etiology   2. Coronary artery disease due to lipid rich plaque   3. Essential hypertension   4. HYPERCHOLESTEROLEMIA, PURE      PLAN:  In order of problems listed above:  1. Chest pain -atypical and likely related to cervical disc disease - pain is sharp and radiates down his left arm and left leg -he just had a cath in May with 25% LAD mid but no other dz.  He has a long hx of chronic CP with normal cath in 2008 -I do not think his pain is cardiac. I will get a 2D echo to rule out pericardial effusion and check a DDimer  2.  Nonobstructive  CAD -25% LAD at cath in May -continue ASA 81mg  daily and statin  3.  HTN -elevated at time of ER visit but normal in office today -Continue Losartan 50mg  daily and followup with PCP  4.  HLD -LDL goal < 70 -continue atorvastatin 80mg  daily  He will followup with Dr. Johnsie Cancel per patient request as he had been seeing him for a while and then lost to followup.  Medication Adjustments/Labs and Tests Ordered: Current medicines are reviewed at length with the patient today.  Concerns regarding medicines are outlined above.  Medication changes, Labs and Tests ordered today are listed in the Patient Instructions below.  Patient Instructions  Medication Instructions:    Your physician recommends that you continue on your current medications as directed. Please refer to the Current Medication list given to you today.  *If you need a refill on your cardiac medications before your next appointment, please call your pharmacy*   Lab Work:  [facility]  If you have labs (blood  work) drawn today and your tests are completely normal, you will receive your results only by: Marland Kitchen MyChart Message (if you have MyChart) OR . A paper copy in the mail If you have any lab test that is abnormal or we need to change your treatment, we will call you to review the results.   Testing/Procedures:  Your physician has requested that you have an echocardiogram. Echocardiography is a painless test that uses sound waves to create images of your heart. It provides your doctor with information about the size and shape of your heart and how well your heart's chambers and valves are working. This procedure takes approximately one hour. There are no restrictions for this procedure.    Follow-Up: At [facility], you and your health needs are our priority.  As part of our continuing mission to provide you with exceptional heart care, we have created designated Provider Care Teams.  These Care Teams include your primary Cardiologist (physician) and Advanced Practice Providers (APPs -  Physician Assistants and Nurse Practitioners) who all work together to provide you with the care you need, when you need it.  Your next appointment:   6 months  The format for your next appointment:   In Person  Provider:   Jenkins Rouge, MD       Signed, Fransico Him, MD  03/02/2019 3:35 PM    [address], [place], [place]  [address] Phone: [phone]; Fax: [phone]

## 2019-03-03 ENCOUNTER — Telehealth: Payer: Self-pay

## 2019-03-03 NOTE — Telephone Encounter (Signed)
-----   Message from Sueanne Margarita, MD sent at 03/02/2019  4:41 PM EST ----- Please let patient know that labs were normal.  Continue current medical therapy.

## 2019-03-03 NOTE — Telephone Encounter (Signed)
Notes recorded by Frederik Schmidt, RN on 03/03/2019 at 10:44 AM EST  The patient has been notified of the result and verbalized understanding. All questions (if any) were answered.  Frederik Schmidt, RN 03/03/2019 10:44 AM

## 2019-03-10 ENCOUNTER — Other Ambulatory Visit: Payer: Self-pay

## 2019-03-10 ENCOUNTER — Ambulatory Visit (HOSPITAL_COMMUNITY): Payer: Medicare Other | Attending: Cardiology

## 2019-03-10 DIAGNOSIS — R079 Chest pain, unspecified: Secondary | ICD-10-CM

## 2019-03-11 ENCOUNTER — Telehealth: Payer: Self-pay

## 2019-03-11 NOTE — Telephone Encounter (Signed)
Notes recorded by Frederik Schmidt, RN on 03/11/2019 at 8:34 AM EST  The patient has been notified of the Echo result and verbalized understanding. All questions (if any) were answered.  Frederik Schmidt, RN 03/11/2019 8:34 AM

## 2019-03-11 NOTE — Telephone Encounter (Signed)
-----   Message from Sueanne Margarita, MD sent at 03/10/2019  7:28 PM EST ----- Echo showed normal LVF with EF 60-65% , mild RV enlargement, trivial leakiness of AV and TV.  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  Repeat limited echo in 1 year for ascending aorta

## 2019-03-19 ENCOUNTER — Other Ambulatory Visit: Payer: Self-pay | Admitting: Family Medicine

## 2019-03-25 ENCOUNTER — Ambulatory Visit: Payer: Medicare Other | Admitting: Podiatry

## 2019-03-26 ENCOUNTER — Ambulatory Visit (INDEPENDENT_AMBULATORY_CARE_PROVIDER_SITE_OTHER): Payer: Medicare Other | Admitting: Family Medicine

## 2019-03-26 ENCOUNTER — Other Ambulatory Visit: Payer: Self-pay

## 2019-03-26 ENCOUNTER — Encounter: Payer: Self-pay | Admitting: Family Medicine

## 2019-03-26 VITALS — BP 124/70 | HR 67 | Temp 98.4°F | Ht 67.0 in | Wt 235.5 lb

## 2019-03-26 DIAGNOSIS — E118 Type 2 diabetes mellitus with unspecified complications: Secondary | ICD-10-CM

## 2019-03-26 DIAGNOSIS — E162 Hypoglycemia, unspecified: Secondary | ICD-10-CM

## 2019-03-26 LAB — POCT GLYCOSYLATED HEMOGLOBIN (HGB A1C): Hemoglobin A1C: 6.9 % — AB (ref 4.0–5.6)

## 2019-03-26 MED ORDER — GLIPIZIDE ER 5 MG PO TB24: 10.0000 mg | ORAL_TABLET | Freq: Every day | ORAL | 1 refills | Status: DC

## 2019-03-26 NOTE — Assessment & Plan Note (Signed)
Few low sugars <70 over the past few months always in early afternoon after glipizide. Will decrease glipizide XR from 10mg  to 5mg  daily. Anticipate low sugar onset as jardiance has taken effect. Otherwise good control of blood sugars. Reviewed hypoglycemia routine. Pt agrees with plan.

## 2019-03-26 NOTE — Progress Notes (Signed)
This visit was conducted in person.  BP 124/70 (BP Location: Left Arm, Patient Position: Sitting, Cuff Size: Large)   Pulse 67   Temp 98.4 F (36.9 C) (Temporal)   Ht 5\' 7"  (1.702 m)   Wt 235 lb 8 oz (106.8 kg)   SpO2 97%   BMI 36.88 kg/m    CC: low CBGs Subjective:    Patient ID: [name], male    DOB: 1950/09/29, 68 y.o.   MRN: [id number]  HPI: [name] is a 68 y.o. male presenting on 03/26/2019 for Low Blood Sugar (C/o recent low BS readings.  Pt provided log (to keep) of recent readings. )   DM - worried about low sugars - has been checking more frequently.feels L hand discomfort and feels woozy when sugars drop to 100. Does regularly check sugars and brings log. Compliant with antihyperglycemic regimen which includes: jardiance 10mg  daily, glipizide XL 10mg  daily, metformin 1000mg  bid. Denies paresthesias. Last diabetic eye exam 05/2018. Pneumovax: 2014. Prevnar: 2017. Glucometer brand: one-touch. DSME: unsure. Lab Results  Component Value Date   HGBA1C 6.9 (A) 03/26/2019   Diabetic Foot Exam - Simple   No data filed     Lab Results  Component Value Date   MICROALBUR 3.2 (H) 06/16/2012        Relevant past medical, surgical, family and social history reviewed and updated as indicated. Interim medical history since our last visit reviewed. Allergies and medications reviewed and updated. Outpatient Medications Prior to Visit  Medication Sig Dispense Refill  . aspirin EC 81 MG tablet Take 81 mg by mouth daily.    Marland Kitchen atorvastatin (LIPITOR) 80 MG tablet Take 1 tablet (80 mg total) by mouth daily. 90 tablet 3  . baclofen (LIORESAL) 10 MG tablet Take 1 tablet (10 mg total) by mouth at bedtime as needed for muscle spasms. 90 tablet 0  . empagliflozin (JARDIANCE) 10 MG TABS tablet Take 10 mg by mouth daily. 90 tablet 3  . fluticasone (FLONASE) 50 MCG/ACT nasal spray USE 2 SPRAYS IN EACH  NOSTRIL DAILY 48 g 3  . gabapentin (NEURONTIN) 100 MG capsule TAKE 1  CAPSULE BY MOUTH TWO TIMES DAILY (Patient taking differently: Take 100 mg by mouth 2 (two) times daily. TAKE 1 CAPSULE BY MOUTH TWO TIMES DAILY) 180 capsule 3  . losartan (COZAAR) 50 MG tablet Take 1 tablet (50 mg total) by mouth daily. 90 tablet 3  . metFORMIN (GLUCOPHAGE) 1000 MG tablet Take 1 tablet (1,000 mg total) by mouth 2 (two) times daily with a meal. 180 tablet 3  . omeprazole (PRILOSEC) 40 MG capsule Take 1 capsule (40 mg total) by mouth daily. 90 capsule 0  . ONETOUCH ULTRA test strip USE WITH METER TO CHECK  SUGAR TWICE DAILY. 200 strip 3  . oxyCODONE-acetaminophen (PERCOCET) 10-325 MG tablet Take 1 tablet by mouth every 4 (four) hours as needed for pain.     Marland Kitchen glipiZIDE (GLUCOTROL XL) 10 MG 24 hr tablet Take 1 tablet (10 mg total) by mouth daily with breakfast. 90 tablet 3  . naproxen (NAPROSYN) 375 MG tablet TAKE 1 TABLET BY MOUTH  TWICE DAILY AS NEEDED WITH  A MEAL (Patient not taking: Reported on 03/26/2019) 60 tablet 1   No facility-administered medications prior to visit.      Per HPI unless specifically indicated in ROS section below Review of Systems Objective:    BP 124/70 (BP Location: Left Arm, Patient Position: Sitting, Cuff Size: Large)   Pulse  67   Temp 98.4 F (36.9 C) (Temporal)   Ht 5\' 7"  (1.702 m)   Wt 235 lb 8 oz (106.8 kg)   SpO2 97%   BMI 36.88 kg/m   Wt Readings from Last 3 Encounters:  03/26/19 235 lb 8 oz (106.8 kg)  03/02/19 228 lb (103.4 kg)  12/30/18 231 lb (104.8 kg)    Physical Exam Vitals signs and nursing note reviewed.  Constitutional:      General: He is not in acute distress.    Appearance: Normal appearance. He is obese. He is not ill-appearing.  Neurological:     Mental Status: He is alert.  Psychiatric:        Mood and Affect: Mood normal.        Behavior: Behavior normal.       Results for orders placed or performed in visit on 03/26/19  POCT glycosylated hemoglobin (Hb A1C)  Result Value Ref Range   Hemoglobin A1C 6.9  (A) 4.0 - 5.6 %   HbA1c POC (<> result, manual entry)     HbA1c, POC (prediabetic range)     HbA1c, POC (controlled diabetic range)     Assessment & Plan:  This visit occurred during the SARS-CoV-2 public health emergency.  Safety protocols were in place, including screening questions prior to the visit, additional usage of staff PPE, and extensive cleaning of exam room while observing appropriate contact time as indicated for disinfecting solutions.   Problem List Items Addressed This Visit    Controlled diabetes mellitus type 2 with complications ([place]) - Primary    Few low sugars <70 over the past few months always in early afternoon after glipizide. Will decrease glipizide XR from 10mg  to 5mg  daily. Anticipate low sugar onset as jardiance has taken effect. Otherwise good control of blood sugars. Reviewed hypoglycemia routine. Pt agrees with plan.       Relevant Medications   glipiZIDE (GLUCOTROL XL) 5 MG 24 hr tablet   Other Relevant Orders   POCT glycosylated hemoglobin (Hb A1C) (Completed)    Other Visit Diagnoses    Hypoglycemia           Meds ordered this encounter  Medications  . glipiZIDE (GLUCOTROL XL) 5 MG 24 hr tablet    Sig: Take 2 tablets (10 mg total) by mouth daily with breakfast.    Dispense:  90 tablet    Refill:  1    Note new dose   Orders Placed This Encounter  Procedures  . POCT glycosylated hemoglobin (Hb A1C)   Patient Instructions  Goal sugars 80-120 fasting.  I think glipizide is causing PM drop - decrease glipizide XR to 5mg  daily.  Update me if sugars staying low.  Glucose meter test strips should be in the mail ([employer])   Follow up plan: No follow-ups on file.  Ria Bush, MD

## 2019-03-26 NOTE — Patient Instructions (Addendum)
Goal sugars 80-120 fasting.  I think glipizide is causing PM drop - decrease glipizide XR to 5mg  daily.  Update me if sugars staying low.  Glucose meter test strips should be in the mail ([employer])

## 2019-03-27 ENCOUNTER — Other Ambulatory Visit: Payer: Self-pay | Admitting: Internal Medicine

## 2019-03-28 ENCOUNTER — Encounter: Payer: Self-pay | Admitting: Family Medicine

## 2019-03-28 DIAGNOSIS — I77810 Thoracic aortic ectasia: Secondary | ICD-10-CM | POA: Insufficient documentation

## 2019-04-15 ENCOUNTER — Telehealth: Payer: Self-pay | Admitting: Family Medicine

## 2019-04-15 MED ORDER — ONETOUCH ULTRA VI STRP: ORAL_STRIP | 0 refills | Status: DC

## 2019-04-15 NOTE — Telephone Encounter (Signed)
Patient stated that his ONE TOUCH ULTRA TEST STRIPS with not arrive from [facility] until the 28th. He would like to know if he could get a script sent to the [facility] so he will have enough until then. Patient stated he only has 1 strip left   [place], [place]

## 2019-04-15 NOTE — Telephone Encounter (Signed)
E-scribed 50 strips to [facility].

## 2019-04-28 ENCOUNTER — Encounter: Payer: Self-pay | Admitting: *Deleted

## 2019-04-29 NOTE — Progress Notes (Signed)
This visit was conducted in person.  BP 120/64 (BP Location: Left Arm, Patient Position: Sitting, Cuff Size: Large)   Pulse 61   Temp 97.8 F (36.6 C) (Temporal)   Ht 5\' 7"  (1.702 m)   Wt 238 lb 5 oz (108.1 kg)   SpO2 97%   BMI 37.33 kg/m    CC: 6 mo f/u visit Subjective:    Patient ID: [name], male    DOB: November 18, 1950, 69 y.o.   MRN: [id number]  HPI: [name] is a 69 y.o. male presenting on 04/30/2019 for Follow-up (Here for 6 mo f/u.  )   DM - does regularly check sugars and brings log - 54-140, lows in PM. Compliant with antihyperglycemic regimen which includes: jardiance 10mg  daily, metformin 1000mg  bid, glipizide XL 10mg  daily with breakfast. Denies low sugars or hypoglycemic symptoms. Denies paresthesias. Last diabetic eye exam 05/2018. Pneumovax: 2014, last today. Prevnar: 2017. Glucometer brand: onetouch. DSME: discussed, declines. Lab Results  Component Value Date   HGBA1C 6.9 (A) 03/26/2019   Diabetic Foot Exam - Simple   Simple Foot Form Diabetic Foot exam was performed with the following findings: Yes 04/30/2019  8:20 AM  Visual Inspection No deformities, no ulcerations, no other skin breakdown bilaterally: Yes Sensation Testing Intact to touch and monofilament testing bilaterally: Yes Pulse Check Posterior Tibialis and Dorsalis pulse intact bilaterally: Yes Comments    Lab Results  Component Value Date   MICROALBUR 3.2 (H) 06/16/2012         Relevant past medical, surgical, family and social history reviewed and updated as indicated. Interim medical history since our last visit reviewed. Allergies and medications reviewed and updated. Outpatient Medications Prior to Visit  Medication Sig Dispense Refill  . aspirin EC 81 MG tablet Take 81 mg by mouth daily.    Marland Kitchen atorvastatin (LIPITOR) 80 MG tablet Take 1 tablet (80 mg total) by mouth daily. 90 tablet 3  . baclofen (LIORESAL) 10 MG tablet Take 1 tablet (10 mg total) by mouth at bedtime as  needed for muscle spasms. 90 tablet 0  . empagliflozin (JARDIANCE) 10 MG TABS tablet Take 10 mg by mouth daily. 90 tablet 3  . fluticasone (FLONASE) 50 MCG/ACT nasal spray USE 2 SPRAYS IN EACH  NOSTRIL DAILY 48 g 3  . gabapentin (NEURONTIN) 100 MG capsule TAKE 1 CAPSULE BY MOUTH TWO TIMES DAILY (Patient taking differently: Take 100 mg by mouth 2 (two) times daily. TAKE 1 CAPSULE BY MOUTH TWO TIMES DAILY) 180 capsule 3  . glucose blood (ONETOUCH ULTRA) test strip USE WITH METER TO CHECK  SUGAR TWICE DAILY. 50 strip 0  . losartan (COZAAR) 50 MG tablet Take 1 tablet (50 mg total) by mouth daily. 90 tablet 3  . metFORMIN (GLUCOPHAGE) 1000 MG tablet Take 1 tablet (1,000 mg total) by mouth 2 (two) times daily with a meal. 180 tablet 3  . naproxen (NAPROSYN) 375 MG tablet TAKE 1 TABLET BY MOUTH  TWICE DAILY AS NEEDED WITH  A MEAL 60 tablet 1  . omeprazole (PRILOSEC) 40 MG capsule TAKE 1 CAPSULE(40 MG) BY MOUTH DAILY 90 capsule 3  . oxyCODONE-acetaminophen (PERCOCET) 10-325 MG tablet Take 1 tablet by mouth every 4 (four) hours as needed for pain.     Marland Kitchen glipiZIDE (GLUCOTROL XL) 5 MG 24 hr tablet Take 2 tablets (10 mg total) by mouth daily with breakfast. 90 tablet 1   No facility-administered medications prior to visit.     Per HPI unless  specifically indicated in ROS section below Review of Systems Objective:    BP 120/64 (BP Location: Left Arm, Patient Position: Sitting, Cuff Size: Large)   Pulse 61   Temp 97.8 F (36.6 C) (Temporal)   Ht 5\' 7"  (1.702 m)   Wt 238 lb 5 oz (108.1 kg)   SpO2 97%   BMI 37.33 kg/m   Wt Readings from Last 3 Encounters:  04/30/19 238 lb 5 oz (108.1 kg)  03/26/19 235 lb 8 oz (106.8 kg)  03/02/19 228 lb (103.4 kg)    Physical Exam Vitals and nursing note reviewed.  Constitutional:      General: He is not in acute distress.    Appearance: Normal appearance. He is well-developed. He is not ill-appearing.  HENT:     Head: Normocephalic and atraumatic.     Right  Ear: External ear normal.     Left Ear: External ear normal.  Eyes:     General: No scleral icterus.    Extraocular Movements: Extraocular movements intact.     Conjunctiva/sclera: Conjunctivae normal.     Pupils: Pupils are equal, round, and reactive to light.  Cardiovascular:     Rate and Rhythm: Normal rate and regular rhythm.     Heart sounds: Normal heart sounds. No murmur.  Pulmonary:     Effort: Pulmonary effort is normal. No respiratory distress.     Breath sounds: Normal breath sounds. No wheezing or rales.  Musculoskeletal:     Cervical back: Normal range of motion and neck supple.     Right lower leg: No edema.     Left lower leg: No edema.     Comments: See HPI for foot exam if done  Lymphadenopathy:     Cervical: No cervical adenopathy.  Skin:    General: Skin is warm and dry.     Findings: No rash.       Results for orders placed or performed in visit on 03/26/19  POCT glycosylated hemoglobin (Hb A1C)  Result Value Ref Range   Hemoglobin A1C 6.9 (A) 4.0 - 5.6 %   HbA1c POC (<> result, manual entry)     HbA1c, POC (prediabetic range)     HbA1c, POC (controlled diabetic range)     Assessment & Plan:  This visit occurred during the SARS-CoV-2 public health emergency.  Safety protocols were in place, including screening questions prior to the visit, additional usage of staff PPE, and extensive cleaning of exam room while observing appropriate contact time as indicated for disinfecting solutions.   Problem List Items Addressed This Visit    Controlled diabetes mellitus type 2 with complications ([place]) - Primary    Less hypoglycemia - doing better on lower glipizide 5mg  XL. Will continue this as well as jardiance and metformin. Foot exam today. RTC 6 mo DM f/u visit.       Relevant Medications   glipiZIDE (GLUCOTROL XL) 5 MG 24 hr tablet    Other Visit Diagnoses    Need for 23-polyvalent pneumococcal polysaccharide vaccine       Relevant Orders   Pneumococcal  polysaccharide vaccine 23-valent greater than or equal to 2yo subcutaneous/IM (Completed)       Meds ordered this encounter  Medications  . glipiZIDE (GLUCOTROL XL) 5 MG 24 hr tablet    Sig: Take 1 tablet (5 mg total) by mouth daily with breakfast.    Dispense:  90 tablet    Refill:  1    Note new dose  Orders Placed This Encounter  Procedures  . Pneumococcal polysaccharide vaccine 23-valent greater than or equal to 2yo subcutaneous/IM    Patient Instructions  Last pneumovax today You are doing well! Continue current medicines Return in 6 months for physical.    Follow up plan: Return for annual exam, prior fasting for blood work, medicare wellness visit.  Ria Bush, MD

## 2019-04-30 ENCOUNTER — Ambulatory Visit (INDEPENDENT_AMBULATORY_CARE_PROVIDER_SITE_OTHER): Payer: Medicare Other | Admitting: Family Medicine

## 2019-04-30 ENCOUNTER — Other Ambulatory Visit: Payer: Self-pay

## 2019-04-30 ENCOUNTER — Encounter: Payer: Self-pay | Admitting: Family Medicine

## 2019-04-30 VITALS — BP 120/64 | HR 61 | Temp 97.8°F | Ht 67.0 in | Wt 238.3 lb

## 2019-04-30 DIAGNOSIS — E118 Type 2 diabetes mellitus with unspecified complications: Secondary | ICD-10-CM

## 2019-04-30 DIAGNOSIS — Z23 Encounter for immunization: Secondary | ICD-10-CM

## 2019-04-30 MED ORDER — GLIPIZIDE ER 5 MG PO TB24: 5.0000 mg | ORAL_TABLET | Freq: Every day | ORAL | 1 refills | Status: DC

## 2019-04-30 NOTE — Assessment & Plan Note (Addendum)
Less hypoglycemia - doing better on lower glipizide 5mg  XL. Will continue this as well as jardiance and metformin. Foot exam today. RTC 6 mo DM f/u visit.

## 2019-04-30 NOTE — Patient Instructions (Addendum)
Last pneumovax today You are doing well! Continue current medicines Return in 6 months for physical.

## 2019-05-04 ENCOUNTER — Other Ambulatory Visit: Payer: Self-pay | Admitting: Family Medicine

## 2019-05-04 NOTE — Telephone Encounter (Signed)
Baclofen Last filled:  02/25/19, #90 Last OV:  04/30/19, 6 mo f/u Next OV:  11/03/19, CPE prt 2

## 2019-05-05 ENCOUNTER — Other Ambulatory Visit: Payer: Self-pay | Admitting: Pharmacist

## 2019-05-05 NOTE — Patient Outreach (Addendum)
[place] [facility]) Care Management  [place] 05/05/2019  [name] 1950/08/23 [id number]  Incoming call and VM received from patient regarding patient assistance.  Unsuccessful return call to patient.  HIPAA compliant VM left.  [facility] pharmacy assisted patient with patient assistance program application for Jardiance in 2020.  Noted in recent O/V with PCP, patient remains on Jardiance.    Plan: Will await callback from patient, will try to call him again later this week if no return call  Ralene Bathe, PharmD, [place] [phone]   Addendum: Incoming call from patient.  He reports he has run out of Jardiance and would like to re-apply.  He has ordered a supply through his mail order so he will have medication arrive soon.  He confirms current address and states he has copy of proof of income.   Plan: I will route patient assistance letter to [place] technician who will coordinate patient assistance program application process for medications listed above.  [facility] pharmacy technician will assist with obtaining all required documents from both patient and provider(s) and submit application(s) once completed.    Ralene Bathe, PharmD, [place] [phone]

## 2019-05-06 ENCOUNTER — Other Ambulatory Visit: Payer: Self-pay | Admitting: Pharmacy Technician

## 2019-05-06 NOTE — Patient Outreach (Signed)
[place] [facility]) Care Management  05/06/2019  DENMAN MORTEN [birth year] [id number]                                       Medication Assistance Referral  Referral From: [facility] RPh Waynard Reeds  Medication/Company: Vania Rea / [employer] Patient application portion:  Mailed Provider application portion: Faxed  to Dr. Biagio Borg Provider address/fax verified via: Office website    Follow up:  Will follow up with patient in 10-14 business days to confirm application(s) have been received.  Maud Deed Chana Bode [place] Certified Pharmacy Technician [place] Management Direct Dial:[phone]

## 2019-05-07 ENCOUNTER — Telehealth: Payer: Self-pay | Admitting: Family Medicine

## 2019-05-07 ENCOUNTER — Ambulatory Visit: Payer: Medicare Other | Admitting: Pharmacist

## 2019-05-07 NOTE — Telephone Encounter (Signed)
Faxed form.

## 2019-05-07 NOTE — Telephone Encounter (Signed)
Received pt assistance form for jardiance, filled, and in Lisa's box.

## 2019-05-08 ENCOUNTER — Other Ambulatory Visit: Payer: Self-pay | Admitting: *Deleted

## 2019-05-08 NOTE — Telephone Encounter (Signed)
Patient called stating that he needs a new script sent to his pharmacy for his nasal spray. Patient stated that he is sneezing, has some head congestion and runny nose. Patient denies being around anyone with covid and does not have a fever. Patient stated that he has had this problem before and the nasal spray really helps his symptoms. [facility]

## 2019-05-11 MED ORDER — FLUTICASONE PROPIONATE 50 MCG/ACT NA SUSP: 2.0000 | Freq: Every day | NASAL | 1 refills | Status: DC

## 2019-05-11 NOTE — Progress Notes (Signed)
GU Location of Tumor / Histology: prostatic adenocarcinoma  If Prostate Cancer, Gleason Score is (3 + 4) and PSA is (1.91). Prostate volume: 28 g.   [name] was diagnosed with prostate cancer in January 2018. He has been under active surveillance since. Unfortunately, his third biopsy revealed progression from 3+3 to 3+4.     Biopsies of biopsy (if applicable) revealed:   Past/Anticipated interventions by urology, if any: prostate biopsy x 3, referral for consideration of brachytherapy  Past/Anticipated interventions by medical oncology, if any: no  Weight changes, if any: no  Bowel/Bladder complaints, if any: IPSS 10. SHIM 3.  Reports nocturia x 4. Reports daytime frequency. Reports ED. Denies dysuria or hematuria. Denies urinary leakage or incontinence. Reports occasional constipation related to prescribed medications.  Nausea/Vomiting, if any: no  Pain issues, if any:  Reports occasional neck or shoulder pain related to previous surgery.  SAFETY ISSUES:  Prior radiation? denies  Pacemaker/ICD? denies  Possible current pregnancy? no, male patient  Is the patient on methotrexate? no  Current Complaints / other details:  69 year old male. Single.

## 2019-05-12 ENCOUNTER — Ambulatory Visit
Admission: RE | Admit: 2019-05-12 | Discharge: 2019-05-12 | Disposition: A | Discharge status: Home / Self Care | Payer: Medicare Other | Source: Ambulatory Visit | Attending: Radiation Oncology | Admitting: Radiation Oncology

## 2019-05-12 ENCOUNTER — Encounter: Payer: Self-pay | Admitting: Radiation Oncology

## 2019-05-12 ENCOUNTER — Other Ambulatory Visit: Payer: Self-pay

## 2019-05-12 VITALS — Ht 68.0 in | Wt 236.0 lb

## 2019-05-12 DIAGNOSIS — C61 Malignant neoplasm of prostate: Secondary | ICD-10-CM

## 2019-05-12 DIAGNOSIS — G894 Chronic pain syndrome: Secondary | ICD-10-CM | POA: Diagnosis not present

## 2019-05-12 DIAGNOSIS — M542 Cervicalgia: Secondary | ICD-10-CM | POA: Diagnosis not present

## 2019-05-12 NOTE — Progress Notes (Signed)
Radiation Oncology         (336) [phone] ________________________________  Initial outpatient Consultation - Conducted via Telephone due to current COVID-19 concerns for limiting patient exposure  Name: [name] MRN: [id number]  Date: 05/12/2019  DOB: [birth year]  [id number], Robert Hatchet, MD  Robert Hughs, MD   REFERRING PHYSICIAN: Ardis Hughs, MD  DIAGNOSIS: 69 y.o. gentleman with Stage T2a adenocarcinoma of the prostate with Gleason score of 3+4, and PSA of 2.43.    ICD-10-CM   1. Malignant neoplasm of prostate ([place])  C61     HISTORY OF PRESENT ILLNESS: [name] is a 69 y.o. male with prostate cancer, diagnosed in 04/2016. He was noted to have a prostate nodule by his primary care physician, Dr. Danise [name] with a PSA of 2.19.  Accordingly, he was referred for evaluation in urology by Dr. Karsten [name] on 03/27/2016,  digital rectal examination was performed at that time revealing a 1.5 mm right apex prostate nodule.  The patient proceeded to transrectal ultrasound with 12 biopsies of the prostate on 04/24/2016.  The prostate volume measured 29.13 cc. Gleason 3+3 disease was seen in 3 cores. They opted to proceed with active surveillance at that time and his care was transferred to Dr. Louis [name].  A surveillance prostate MRI performed on 08/12/2016 revealed an 11 mm right medial apex nodule, non-enhancing without evidence of high-grade disease. His PSA remained normal, never getting above 2.7.  A repeat biopsy performed on 08/21/2016 showed stable, low volume Gleason 3+3 in 2 cores, with a prostate volume of 25 cc.  He elected to continue in active surveillance.  His most recent PSA from 04/21/2019 was 2.43 and a repeat surveillance biopsy on 04/21/2019 showed a prostate volume measuring 28 grams. Out of 12 core biopsies, 3 were positive.  The maximum Gleason score was 3+4, and this was seen in the left mid lateral and right apex.  Additionally, Gleason 3+3 was seen in the left  apex lateral.  The patient reviewed the biopsy results with his urologist and he has kindly been referred today for discussion of potential radiation treatment options.  He is accompanied on the phone today by his daughter, [name].  PREVIOUS RADIATION THERAPY: No  PAST MEDICAL HISTORY:  Past Medical History:  Diagnosis Date   Abdominal pain, epigastric 10/28/2009   Qualifier: Diagnosis of  By: Surface RN, Donna     Abdominal wall hernia    ABDOMINAL WALL HERNIA 10/03/2006   Qualifier: Diagnosis of  Problem Stop Reason:  By: Council Mechanic MD, Hilaria Ota    Abnormal stress test    Allergic rhinitis 10/01/2014   Anxiety    ANXIETY DEPRESSION 03/19/2007   Back pain    with radiculopathy   Back pain with radiculopathy 12/31/2006   Qualifier: Diagnosis of  By: Council Mechanic MD, Hilaria Ota    Balanitis 08/06/2017   Bladder wall thickening 2013   nl rpt CT scan, normal cytology and UCx, released from urology [name])   CAD (coronary artery disease) 09/03/2018   Nonobstructive 25% plaque in LAD by cath 08/2018.    Chest pain 08/25/2018   Chest pain, non-cardiac 2008   adenosine myoview low risk.  EF 60%.  normal cath 2008, deemed atypical /noncardiac, ?discogenic   Chronic neck pain    Chronic pain    narcotic dependent ([place])   Circadian rhythm sleep disorder, irregular sleep-wake type    COCAINE ABUSE, HX OF 10/01/2006   Qualifier: Diagnosis of  By: Fuller Plan CMA (AAMA), Terri Skains  Complication of anesthesia    hard to put patient to sleep   Cough 06/24/2013   Depression    Diabetic neuropathy ([place]) 05/31/2014   Diverticulosis 2013   by CT scan   DYSPNEA 09/22/2007   Qualifier: Diagnosis of  By: Council Mechanic MD, Hilaria Ota    Edema 12/15/2010   EPIDIDYMITIS 12/31/2006   Qualifier: Diagnosis of  By: Council Mechanic MD, Hilaria Ota    Erectile dysfunction 08/01/2015   Esophageal dysphagia 10/10/2011   Esophageal stricture 06/2007   dilation of stricture   Essential hypertension  01/01/2008   Fatty liver 07/31/2018   By [place] 07/2018   FOOT PAIN, RIGHT 07/25/2007   Qualifier: Diagnosis of  By: Council Mechanic MD, Hilaria Ota    GERD 09/29/2008   GERD (gastroesophageal reflux disease)    Gross hematuria 2013, 2016   negative CT, cystoscopy, cytology, 2016 - thought from E coli UTI s/p appropriate treatment ([place])   Headache(784.0) 02/12/2012   Hematochezia 10/09/2011   HIP PAIN, RIGHT 03/09/2010   Qualifier: Diagnosis of  By: Damita Dunnings MD, Phillip Heal     History of cocaine use 2004   History of epididymitis    History of prostatitis 08/2011   ?chronic/simmering, treated with 1 mo cipro   HYPERCHOLESTEROLEMIA, PURE 08/21/2005   Nonobstructive CAD by cath 08/2018. Goal LDL <70.    Hyperlipidemia    Hypertension    Essential   Insomnia 06/19/2011   Iron deficiency anemia 07/01/2009   Lightheadedness 08/06/2017   Lipoma of neck 10/01/2014   S/p excision   Lower abdominal pain 04/20/2013   Male sexual dysfunction 09/11/2011   NSAID-induced gastric ulcer 01/07/2019   OSA (obstructive sleep apnea) 2010   mild off CPAP   Pain due to onychomycosis of toenails of both feet 10/15/2018   Postlaminectomy syndrome of cervical region    s/p ACDF C5-7 2010 ([place])   Prostate cancer ([place]) 06/16/2012   T2a, Gleason 6 prostate cancer found by R apical nodule with PSA 2.18 [name], [place]) 04/2016   Prostate cancer ([place]) 06/16/2012   T2a, Gleason 6 prostate cancer found by R apical nodule with PSA 2.18 [name], [place]) 04/2016 Prostate MRI 07/2016 - 40m nodule R apex - thought benign adenoma Continued active surveillance planned   Prostatitis 08/27/2011   Rhinitis 10/26/2010   Right arm pain 07/28/2018   Right sided abdominal pain 07/28/2018   Severe obesity (BMI 35.0-39.9) with comorbidity ([place]) 01/08/2012   Shoulder pain 04/08/2011   SINUSITIS 09/29/2008   Qualifier: Diagnosis of  By: Burnett Kanaris     Skin rash 10/01/2012   SKIN RASH 05/04/2010   Qualifier: Diagnosis  of  By: Damita Dunnings MD, Phillip Heal     T2DM (type 2 diabetes mellitus) ([place])    Tachycardia 06/24/2013   Tendonitis of ankle or foot 09/23/2013   TIA (transient ischemic attack)    cocaine related   Tinea cruris 10/08/2012   Tongue lesion 08/05/2018   URI 03/05/2007   Qualifier: Diagnosis of  By: Damita Dunnings MD, Phillip Heal        PAST SURGICAL HISTORY: Past Surgical History:  Procedure Laterality Date   ANTERIOR CERVICAL DISCECTOMY  10/28/2008   with fusion (Dr. Rolena Infante)   [place]  2008   WNL    CARPAL TUNNEL RELEASE     Right   CATARACT EXTRACTION Bilateral 12/2014, 01/2015   Dr Tommy Rainwater   COLONOSCOPY  10/11/2011   severe diverticulosis, int hemorrhoids, rec rpt 5 yrs Carlean Purl)   COLONOSCOPY  12/2018   benign rectal  polyp Carlean Purl)   DIRECT LARYNGOSCOPY  11/02/2008   Extubation under anesthesia (Dr. Erik Obey)   ESOPHAGOGASTRODUODENOSCOPY  07/17/07   Esoph. Stricture dilated (Dr. Henrene Pastor)   ESOPHAGOGASTRODUODENOSCOPY  10/11/2011   WNL Carlean Purl)   ESOPHAGOGASTRODUODENOSCOPY  12/2018   gastric ulcer, gastritis, dilated esophageal stenosis, limit NSAIDs continue PPI, no f/u planned Carlean Purl)   EXCISION MASS NECK N/A 08/29/2016   Procedure: EXCISION MASS POSTERIOR NECK;  Surgeon: Erroll Luna, MD;  Location: [facility];  Service: General;  Laterality: N/A;   HEMATOMA EVACUATION  10/29/2008   (Dr. Rolena Infante)   [place]     bilateral, 5 cm. RLQ Abd Lipoma Excision (Dr. Lucia Gaskins)   [place] CATH AND CORONARY ANGIOGRAPHY N/A 08/29/2018   Procedure: LEFT HEART CATH AND CORONARY ANGIOGRAPHY;  Surgeon: Jettie Booze, MD;  Location: [facility];  Service: Cardiovascular;  Laterality: N/A;   LIPOMA EXCISION  08/2016   posterior neck   SHOULDER ARTHROSCOPY WITH SUBACROMIAL DECOMPRESSION AND OPEN ROTATOR C Left 01/23/2013   Procedure: LEFT SHOULDER ARTHROSCOPY WITH SUBACROMIAL DECOMPRESSION AND MINI OPEN ROTATOR CUFF REPAIR, AND OPEN DISTAL  CLAVICLE RESECTION;  Surgeon: Augustin Schooling, MD;  Location: [facility];  Service: Orthopedics;  Laterality: Left;   SHOULDER SURGERY Right 2013   replaced   TONSILLECTOMY  childhood   TRIGGER FINGER RELEASE     Right long finger    FAMILY HISTORY:  Family History  Problem Relation Age of Onset   Diabetes Mother    Hypertension Mother    Hyperlipidemia Mother    Diabetes Father    Diabetes Sister    Hypertension Sister    Hypertension Brother    Diabetes Brother    Diabetes Brother    Hypertension Brother    Diabetes Brother    Heart disease Maternal Uncle        Heart failure   Cancer Paternal Uncle        Prostate   Cancer Paternal Grandfather        Prostate   Stroke Neg Hx    Esophageal cancer Neg Hx    Rectal cancer Neg Hx    Stomach cancer Neg Hx    Breast cancer Neg Hx    Colon cancer Neg Hx     SOCIAL HISTORY:  Social History   Socioeconomic History   Marital status: Single    Spouse name: Not on file   Number of children: 1   Years of education: Not on file   Highest education level: Not on file  Occupational History   Occupation: [name] previously    Fish farm manager: UNEMPLOYED    Comment: Now on disability after neck surgery  Tobacco Use   Smoking status: Never Smoker   Smokeless tobacco: Never Used  Substance and Sexual Activity   Alcohol use: No    Alcohol/week: 0.0 standard drinks   Drug use: No    Comment: H/O marijuana (last 2005); Cocaine 5/04   Sexual activity: Not Currently  Other Topics Concern   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   Activity: no regular exercise, wants to start walking   Diet: good water, fruits/vegetables daily.      Retired   1 daughter and 1 granddaughter   Social Determinants of Adult nurse Strain:  Difficulty of Paying Living Expenses: Not on file  Food Insecurity:    Worried About [place] in the Last Year: Not on file   Ran Out of Food in the Last Year: Not on file  Transportation Needs:    Lack of Transportation (Medical): Not on file   Lack of Transportation (Non-Medical): Not on file  Physical Activity:    Days of Exercise per Week: Not on file   Minutes of Exercise per Session: Not on file  Stress:    Feeling of Stress : Not on file  Social Connections:    Frequency of Communication with Friends and Family: Not on file   Frequency of Social Gatherings with Friends and Family: Not on file   Attends Religious Services: Not on file   Active Member of Clubs or Organizations: Not on file   Attends Archivist Meetings: Not on file   Marital Status: Not on file  Intimate Partner Violence:    Fear of Current or Ex-Partner: Not on file   Emotionally Abused: Not on file   Physically Abused: Not on file   Sexually Abused: Not on file    ALLERGIES: Gabapentin, Glimepiride, and Menthol (topical analgesic)  MEDICATIONS:  Current Outpatient Medications  Medication Sig Dispense Refill   aspirin EC 81 MG tablet Take 81 mg by mouth daily.     baclofen (LIORESAL) 10 MG tablet TAKE 1 TABLET BY MOUTH AT  BEDTIME AS NEEDED FOR  MUSCLE SPASM(S) 90 tablet 0   empagliflozin (JARDIANCE) 10 MG TABS tablet Take 10 mg by mouth daily. 90 tablet 3   gabapentin (NEURONTIN) 100 MG capsule TAKE 1 CAPSULE BY MOUTH TWO TIMES DAILY (Patient taking differently: Take 100 mg by mouth 2 (two) times daily. TAKE 1 CAPSULE PER DAY BY MOUTH) 180 capsule 3   glipiZIDE (GLUCOTROL XL) 5 MG 24 hr tablet Take 1 tablet (5 mg total) by mouth daily with breakfast. 90 tablet 1   glucose blood (ONETOUCH ULTRA) test strip USE WITH METER TO CHECK  SUGAR TWICE DAILY. 50 strip 0   losartan (COZAAR) 50 MG tablet Take 1 tablet (50 mg total) by  mouth daily. 90 tablet 3   metFORMIN (GLUCOPHAGE) 1000 MG tablet Take 1 tablet (1,000 mg total) by mouth 2 (two) times daily with a meal. 180 tablet 3   omeprazole (PRILOSEC) 40 MG capsule TAKE 1 CAPSULE(40 MG) BY MOUTH DAILY 90 capsule 3   oxyCODONE-acetaminophen (PERCOCET) 10-325 MG tablet Take 1 tablet by mouth every 4 (four) hours as needed for pain.      atorvastatin (LIPITOR) 80 MG tablet Take 1 tablet (80 mg total) by mouth daily. 90 tablet 3   fluticasone (FLONASE) 50 MCG/ACT nasal spray Place 2 sprays into both nostrils daily. (Patient not taking: Reported on 05/12/2019) 48 g 1   No current facility-administered medications for this encounter.    REVIEW OF SYSTEMS:  On review of systems, the patient reports that he is doing well overall. He denies any chest pain, shortness of breath, cough, fevers, chills, night sweats, unintended weight changes. He denies any bowel disturbances, and denies abdominal pain, nausea or vomiting. He denies any new musculoskeletal or joint aches or pains but does note occasional neck/shoulder pain related to previous surgery. He reports occasional constipation related to his prescribed medications. His IPSS was 10, indicating mild-moderate urinary symptoms. He reports nocturia x4 and daytime frequency. His SHIM was 3, indicating he has severe erectile dysfunction. A complete review of  systems is obtained and is otherwise negative.    PHYSICAL EXAM:  Wt Readings from Last 3 Encounters:  05/12/19 236 lb (107 kg)  04/30/19 238 lb 5 oz (108.1 kg)  03/26/19 235 lb 8 oz (106.8 kg)   Temp Readings from Last 3 Encounters:  04/30/19 97.8 F (36.6 C) (Temporal)  03/26/19 98.4 F (36.9 C) (Temporal)  02/25/19 99 F (37.2 C)   BP Readings from Last 3 Encounters:  04/30/19 120/64  03/26/19 124/70  03/02/19 130/66   Pulse Readings from Last 3 Encounters:  04/30/19 61  03/26/19 67  03/02/19 80   Pain Assessment Pain Score: 0-No pain(occasional pain  related to neck and shoulder surgery)/10  Physical exam not performed in light of telephone consult visit format.  KPS = 90  100 - Normal; no complaints; no evidence of disease. 90   - Able to carry on normal activity; minor signs or symptoms of disease. 80   - Normal activity with effort; some signs or symptoms of disease. 50   - Cares for self; unable to carry on normal activity or to do active work. 60   - Requires occasional assistance, but is able to care for most of his personal needs. 50   - Requires considerable assistance and frequent medical care. 62   - Disabled; requires special care and assistance. 4   - Severely disabled; hospital admission is indicated although death not imminent. 29   - Very sick; hospital admission necessary; active supportive treatment necessary. 10   - Moribund; fatal processes progressing rapidly. 0     - Dead  Karnofsky DA, Abelmann Wilder, Craver LS and [facility] [phone]) The use of the nitrogen mustards in the palliative treatment of carcinoma: with particular reference to bronchogenic carcinoma Cancer 1 634-56  LABORATORY DATA:  Lab Results  Component Value Date   WBC 5.9 02/25/2019   HGB 14.7 02/25/2019   HCT 44.8 02/25/2019   MCV 91.6 02/25/2019   PLT 298 02/25/2019   Lab Results  Component Value Date   NA 141 02/25/2019   K 3.8 02/25/2019   CL 104 02/25/2019   CO2 27 02/25/2019   Lab Results  Component Value Date   ALT 23 10/16/2018   AST 15 10/16/2018   ALKPHOS 41 10/16/2018   BILITOT 0.2 10/16/2018     RADIOGRAPHY: No results found.    IMPRESSION/PLAN: This visit was conducted via Telephone to spare the patient unnecessary potential exposure in the healthcare setting during the current COVID-19 pandemic. 1. 69 y.o. gentleman with Stage T2a adenocarcinoma of the prostate with Gleason Score of 3+4, and PSA of 2.43. We discussed the patient's workup and outlined the nature of prostate cancer in this setting. The patient's T  stage, Gleason's score, and PSA put him into the favorable intermediate risk group. Accordingly, he is eligible for a variety of potential treatment options including brachytherapy, 5.5 weeks of external radiation or prostatectomy We discussed the available radiation techniques, and focused on the details and logistics of delivery. We discussed and outlined the risks, benefits, short and long-term effects associated with radiotherapy and compared and contrasted these with prostatectomy. We discussed the role of SpaceOAR in reducing the rectal toxicity associated with radiotherapy. He and his daughter were encouraged to ask questions that were answered to their stated satisfaction.  At the end of the conversation, the patient is interested in moving forward with brachytherapy and use of SpaceOAR gel to reduce rectal toxicity from radiotherapy.  We will  share our discussion with Dr. Louis [name] and move forward with coordinating this procedure for the near future.  The patient will be contacted by Romie Jumper in our office who will be working closely with him to coordinate OR scheduling and pre and post procedure appointments.  We will contact the pharmaceutical rep to ensure that [place] is available at the time of procedure.  He will have a prostate MRI following his post-seed CT SIM to confirm appropriate distribution of the [place].  He appears to have a good understanding of his disease and our recommendations for treatment which are of curative intent.  He is comfortable and in agreement with the stated plan so we will move forward with treatment planning accordingly.  Given current concerns for patient exposure during the COVID-19 pandemic, this encounter was conducted via telephone. The patient was notified in advance and was offered a MyChart meeting to allow for face to face communication but unfortunately reported that he did not have the appropriate resources/technology to support such a visit and  instead preferred to proceed with telephone consult. The patient has given verbal consent for this type of encounter. The time spent during this encounter was 45 minutes. The attendants for this meeting include Tyler Pita MD, Ashlyn Bruning PA-C, [place], and patient, [name] and his daughter [name]. During the encounter, Tyler Pita MD, Ashlyn Bruning PA-C, and scribe, Wilburn Mylar were located at [place].  Patient, [name] and daughter, [name], were located at home.    Nicholos Johns, PA-C    Tyler Pita, MD  [facility] Direct Dial: [phone]   Fax: [phone] [email or web address]   Skype   [email or web address]  This document serves as a record of services personally performed by Tyler Pita, MD and Freeman Caldron, PA-C. It was created on their behalf by Wilburn Mylar, a trained medical scribe. The creation of this record is based on the scribe's personal observations and the provider's statements to them. This document has been checked and approved by the attending provider.

## 2019-05-12 NOTE — Progress Notes (Signed)
See progress note under physician encounter. 

## 2019-05-15 ENCOUNTER — Telehealth: Payer: Self-pay | Admitting: Medical Oncology

## 2019-05-15 NOTE — Telephone Encounter (Signed)
Spoke with patient to introduce myself as the prostate nurse navigator and discuss my role. I was unable to meet him 1/19 when he consulted with Dr. Tammi Klippel. He states the consult went well and he has chosen brachytherapy. I discussed the procedures and appointments. I informed him, Enid Derry will call him with appointment dates and times. I gave him my contact information and asked him to call me with questions or concerns. He voiced understanding.

## 2019-05-19 ENCOUNTER — Other Ambulatory Visit: Payer: Self-pay | Admitting: Pharmacist

## 2019-05-19 NOTE — Patient Outreach (Signed)
[facility]) Care Management  [place] 05/19/2019  [name] Sep 07, 1950 [id number]  Incoming call from patient.  He received application and wanted to verify that he will need to send in proof of income.  We reviewed acceptable documents.  Patient inquired about sending in original documents however I recommended that he make a copy of documents as I cannot guarantee mail will not get lost.  Patient reports he will try to make a copy today and return application as soon as he can.   Plan: Will await return of application for Jardiance patient assistance program  Ralene Bathe, PharmD, [place] [phone]

## 2019-05-20 ENCOUNTER — Telehealth: Payer: Self-pay | Admitting: *Deleted

## 2019-05-20 ENCOUNTER — Other Ambulatory Visit: Payer: Self-pay | Admitting: Urology

## 2019-05-20 NOTE — Telephone Encounter (Signed)
Called patient to inform of pre-seed planning CT and implant, spoke with patient and he is aware of these appts.

## 2019-05-27 ENCOUNTER — Other Ambulatory Visit: Payer: Self-pay | Admitting: Pharmacy Technician

## 2019-05-27 ENCOUNTER — Ambulatory Visit: Payer: Medicare Other | Admitting: Podiatry

## 2019-05-27 ENCOUNTER — Other Ambulatory Visit: Payer: Self-pay

## 2019-05-27 DIAGNOSIS — B351 Tinea unguium: Secondary | ICD-10-CM

## 2019-05-27 DIAGNOSIS — E1142 Type 2 diabetes mellitus with diabetic polyneuropathy: Secondary | ICD-10-CM

## 2019-05-27 DIAGNOSIS — M79674 Pain in right toe(s): Secondary | ICD-10-CM | POA: Diagnosis not present

## 2019-05-27 DIAGNOSIS — M79675 Pain in left toe(s): Secondary | ICD-10-CM

## 2019-05-27 NOTE — Progress Notes (Signed)
Complaint:  °Visit Type: Patient returns to my office for continued preventative foot care services. Complaint: Patient states" my nails have grown long and thick and become painful to walk and wear shoes" Patient has been diagnosed with DM with foot neuropathy. The patient presents for preventative foot care services. No changes to ROS ° °Podiatric Exam: °Vascular: dorsalis pedis and posterior tibial pulses are palpable bilateral. Capillary return is immediate. Temperature gradient is WNL. Skin turgor WNL  °Sensorium: Normal Semmes Weinstein monofilament test. Normal tactile sensation bilaterally. °Nail Exam: Pt has thick disfigured discolored nails with subungual debris noted bilateral entire nail hallux through fifth toenails °Ulcer Exam: There is no evidence of ulcer or pre-ulcerative changes or infection. °Orthopedic Exam: Muscle tone and strength are WNL. No limitations in general ROM. No crepitus or effusions noted. Foot type and digits show no abnormalities. Bony prominences are unremarkable. °Skin: No Porokeratosis. No infection or ulcers ° °Diagnosis:  °Onychomycosis, , Pain in right toe, pain in left toes ° °Treatment & Plan °Procedures and Treatment: Consent by patient was obtained for treatment procedures.   Debridement of mycotic and hypertrophic toenails, 1 through 5 bilateral and clearing of subungual debris. No ulceration, no infection noted.  °Return Visit-Office Procedure: Patient instructed to return to the office for a follow up visit 4 months   for continued evaluation and treatment. ° ° ° °Padme Arriaga DPM °

## 2019-05-27 NOTE — Patient Outreach (Signed)
[facility]) Care Management  05/27/2019  ZIA GUARISCO January 05, 1951 [id number]   Received patient portion(s) of patient assistance application(s) for Jardiance. Faxed completed application and required documents into [employer].  Will follow up with company(ies) in 7-10 business days to check status of application(s).  Maud Deed Chana Bode [place] Certified Pharmacy Technician [place] Management Direct [phone]

## 2019-06-03 ENCOUNTER — Telehealth: Payer: Self-pay | Admitting: *Deleted

## 2019-06-03 NOTE — Telephone Encounter (Signed)
Called patient to remind of pre-seed appts. for 06-04-19, spoke with patient and he is aware of these appts.

## 2019-06-04 ENCOUNTER — Ambulatory Visit (HOSPITAL_COMMUNITY)
Admission: RE | Admit: 2019-06-04 | Discharge: 2019-06-04 | Disposition: A | Discharge status: Home / Self Care | Payer: Medicare Other | Source: Ambulatory Visit | Attending: Urology | Admitting: Urology

## 2019-06-04 ENCOUNTER — Other Ambulatory Visit: Payer: Self-pay | Admitting: Urology

## 2019-06-04 ENCOUNTER — Ambulatory Visit
Admission: RE | Admit: 2019-06-04 | Discharge: 2019-06-04 | Disposition: A | Discharge status: Home / Self Care | Payer: Medicare Other | Source: Ambulatory Visit | Attending: Urology | Admitting: Urology

## 2019-06-04 ENCOUNTER — Encounter: Payer: Self-pay | Admitting: Medical Oncology

## 2019-06-04 ENCOUNTER — Other Ambulatory Visit: Payer: Self-pay

## 2019-06-04 ENCOUNTER — Encounter (HOSPITAL_COMMUNITY)
Admission: RE | Admit: 2019-06-04 | Discharge: 2019-06-04 | Disposition: A | Discharge status: Home / Self Care | Payer: Medicare Other | Source: Ambulatory Visit | Attending: Urology | Admitting: Urology

## 2019-06-04 ENCOUNTER — Ambulatory Visit
Admission: RE | Admit: 2019-06-04 | Discharge: 2019-06-04 | Disposition: A | Discharge status: Home / Self Care | Payer: Medicare Other | Source: Ambulatory Visit | Attending: Radiation Oncology | Admitting: Radiation Oncology

## 2019-06-04 DIAGNOSIS — C61 Malignant neoplasm of prostate: Secondary | ICD-10-CM

## 2019-06-04 DIAGNOSIS — Z01818 Encounter for other preprocedural examination: Secondary | ICD-10-CM | POA: Diagnosis not present

## 2019-06-04 NOTE — Progress Notes (Signed)
  Radiation Oncology         (336) [phone] ________________________________  Name: AHAMAD SUMMAR MRN: [id number]  Date: 06/04/2019  DOB: 1951-01-07  SIMULATION AND TREATMENT PLANNING NOTE PUBIC ARCH STUDY  [id number], [name], MD  Ria Bush, MD  DIAGNOSIS:  69 y.o. gentleman with Stage T2a adenocarcinoma of the prostate with Gleason score of 3+4, and PSA of 2.43.     ICD-10-CM   1. Malignant neoplasm of prostate ([place])  C61     COMPLEX SIMULATION:  The patient presented today for evaluation for possible prostate seed implant. He was brought to the radiation planning suite and placed supine on the CT couch. A 3-dimensional image study set was obtained in upload to the planning computer. There, on each axial slice, I contoured the prostate gland. Then, using three-dimensional radiation planning tools I reconstructed the prostate in view of the structures from the transperineal needle pathway to assess for possible pubic arch interference. In doing so, I did not appreciate any pubic arch interference. Also, the patient's prostate volume was estimated based on the drawn structure. The volume was 28 cc.  Given the pubic arch appearance and prostate volume, patient remains a good candidate to proceed with prostate seed implant. Today, he freely provided informed written consent to proceed.    PLAN: The patient will undergo prostate seed implant.   ________________________________  Sheral Apley. Tammi Klippel, M.D.

## 2019-06-16 DIAGNOSIS — H353131 Nonexudative age-related macular degeneration, bilateral, early dry stage: Secondary | ICD-10-CM | POA: Diagnosis not present

## 2019-06-16 DIAGNOSIS — E119 Type 2 diabetes mellitus without complications: Secondary | ICD-10-CM | POA: Diagnosis not present

## 2019-06-16 DIAGNOSIS — H16223 Keratoconjunctivitis sicca, not specified as Sjogren's, bilateral: Secondary | ICD-10-CM | POA: Diagnosis not present

## 2019-06-16 DIAGNOSIS — H40013 Open angle with borderline findings, low risk, bilateral: Secondary | ICD-10-CM | POA: Diagnosis not present

## 2019-06-16 DIAGNOSIS — H04123 Dry eye syndrome of bilateral lacrimal glands: Secondary | ICD-10-CM | POA: Diagnosis not present

## 2019-06-17 ENCOUNTER — Other Ambulatory Visit: Payer: Self-pay | Admitting: Pharmacy Technician

## 2019-06-17 NOTE — Patient Outreach (Signed)
[facility]) Care Management  06/17/2019  STEWARD COST 03/25/1951 [id number]    Follow up call placed to [employer] regarding patient assistance application(s) for Laury Axon confirms patient has been approved as of 2/8 until 04/22/20.   Follow up:  Will follow up with patient in the next 7 business days to confir medication has been received.  Maud Deed Chana Bode Leighton Certified Pharmacy Technician [facility] Management Direct Dial:[phone]

## 2019-06-18 ENCOUNTER — Other Ambulatory Visit: Payer: Self-pay | Admitting: Pharmacy Technician

## 2019-06-25 ENCOUNTER — Encounter (HOSPITAL_BASED_OUTPATIENT_CLINIC_OR_DEPARTMENT_OTHER): Payer: Self-pay | Admitting: Urology

## 2019-06-25 ENCOUNTER — Other Ambulatory Visit: Payer: Self-pay

## 2019-06-25 NOTE — Progress Notes (Addendum)
ADDENDUM:  Chart reviewed by anesthesia, Konrad Felix PA, ok to proceed.   Spoke w/ via phone for pre-op interview--- PT Lab needs dos----  no            Lab results------ getting CBC,CMP,PT/PTT done 06-30-2019 @ 0930 COVID test ------ 06-30-2019 @ [id number] Arrive at -------  0930 NPO after ------ [place] Medications to take morning of surgery ----- Gabapentin, Lipitor, Prilosec, w/ sips of water and do flonase spray Diabetic medication ----- do not take any diabetic medication morning of surgery Patient Special Instructions ----- do one fleet enema morning of surgery Pre-Op special Istructions -----  n/a Patient verbalized understanding of instructions that were given at this phone interview. Patient denies shortness of breath, chest pain, fever, cough a this phone interview.   Anesthesia Review:  HTN, DM2,  NonobCAD, remote hx TIA (coaine related), Fatty liver (chart to be reviewe by Konrad Felix PA)  PCP:  Dr Richardean Chimera (lov in epic) Cardiologist : Dr T. Turner  (lov 03-02-2019 epic) Chest x-ray :  06-04-2019 epic EKG : 06-04-2019 epic Echo :  03-10-2019 epic Stress test:  Nuclear 08-27-2018 epic Cardiac Cath :   08-29-2018  epic Sleep Study/ CPAP :  Yes/  No Fasting Blood Sugar :    140-150  / Checks Blood Sugar -- times a day:  3 times daily Blood Thinner/ Instructions Maryjane Hurter Dose:  NO ASA / Instructions/ Last Dose : ASA 81mg /  Pt stated was given instructions to stop week prior to surgery by dr Louis Meckel office/  Last dose today 06-25-2019

## 2019-06-26 ENCOUNTER — Telehealth: Payer: Self-pay | Admitting: *Deleted

## 2019-06-26 NOTE — Telephone Encounter (Signed)
Called patient to remind of Covid testing and lab for 06-30-19, spoke with patient and he is aware of these appts.

## 2019-06-30 ENCOUNTER — Encounter (HOSPITAL_COMMUNITY)
Admission: RE | Admit: 2019-06-30 | Discharge: 2019-06-30 | Disposition: A | Discharge status: Home / Self Care | Payer: Medicare Other | Source: Ambulatory Visit | Attending: Urology | Admitting: Urology

## 2019-06-30 ENCOUNTER — Other Ambulatory Visit: Payer: Self-pay

## 2019-06-30 ENCOUNTER — Other Ambulatory Visit (HOSPITAL_COMMUNITY)
Admission: RE | Admit: 2019-06-30 | Discharge: 2019-06-30 | Disposition: A | Discharge status: Home / Self Care | Payer: Medicare Other | Source: Ambulatory Visit | Attending: Urology | Admitting: Urology

## 2019-06-30 DIAGNOSIS — Z20822 Contact with and (suspected) exposure to covid-19: Secondary | ICD-10-CM | POA: Diagnosis not present

## 2019-06-30 DIAGNOSIS — Z01812 Encounter for preprocedural laboratory examination: Secondary | ICD-10-CM | POA: Diagnosis not present

## 2019-06-30 LAB — CBC
HCT: 40.1 % (ref 39.0–52.0)
Hemoglobin: 13.2 g/dL (ref 13.0–17.0)
MCH: 30.1 pg (ref 26.0–34.0)
MCHC: 32.9 g/dL (ref 30.0–36.0)
MCV: 91.6 fL (ref 80.0–100.0)
Platelets: 273 10*3/uL (ref 150–400)
RBC: 4.38 MIL/uL (ref 4.22–5.81)
RDW: 14.9 % (ref 11.5–15.5)
WBC: 4.5 10*3/uL (ref 4.0–10.5)
nRBC: 0 % (ref 0.0–0.2)

## 2019-06-30 LAB — COMPREHENSIVE METABOLIC PANEL
ALT: 23 U/L (ref 0–44)
AST: 15 U/L (ref 15–41)
Albumin: 3.8 g/dL (ref 3.5–5.0)
Alkaline Phosphatase: 54 U/L (ref 38–126)
Anion gap: 8 (ref 5–15)
BUN: 11 mg/dL (ref 8–23)
CO2: 26 mmol/L (ref 22–32)
Calcium: 9 mg/dL (ref 8.9–10.3)
Chloride: 107 mmol/L (ref 98–111)
Creatinine, Ser: 0.86 mg/dL (ref 0.61–1.24)
GFR calc Af Amer: >60 mL/min (ref >60)
GFR calc non Af Amer: >60 mL/min (ref >60)
Glucose, Bld: 131 mg/dL — ABNORMAL HIGH (ref 70–99)
Potassium: 3.8 mmol/L (ref 3.5–5.1)
Sodium: 141 mmol/L (ref 135–145)
Total Bilirubin: 0.3 mg/dL (ref 0.3–1.2)
Total Protein: 7.1 g/dL (ref 6.5–8.1)

## 2019-06-30 LAB — PROTIME-INR
INR: 0.9 (ref 0.8–1.2)
Prothrombin Time: 12.5 seconds (ref 11.4–15.2)

## 2019-06-30 LAB — APTT: aPTT: 34 seconds (ref 24–36)

## 2019-06-30 LAB — SARS CORONAVIRUS 2 (TAT 6-24 HRS): SARS Coronavirus 2: NEGATIVE

## 2019-07-01 ENCOUNTER — Other Ambulatory Visit: Payer: Self-pay | Admitting: Family Medicine

## 2019-07-01 NOTE — Telephone Encounter (Signed)
Baclofen Last filled:  05/05/19, #90 Last OV:  04/30/19, 6 mo f/ Next OV:  11/03/19, AWV prt 2

## 2019-07-02 ENCOUNTER — Telehealth: Payer: Self-pay | Admitting: *Deleted

## 2019-07-02 NOTE — Telephone Encounter (Signed)
CALLED PATIENT TO REMIND OF IMPLANT FOR 07-03-19, SPOKE WITH PATIENT AND HE IS AWARE OF THIS PROCEDURE

## 2019-07-03 ENCOUNTER — Encounter (HOSPITAL_BASED_OUTPATIENT_CLINIC_OR_DEPARTMENT_OTHER): Payer: Self-pay | Admitting: Urology

## 2019-07-03 ENCOUNTER — Ambulatory Visit (HOSPITAL_BASED_OUTPATIENT_CLINIC_OR_DEPARTMENT_OTHER): Payer: Medicare Other | Admitting: Physician Assistant

## 2019-07-03 ENCOUNTER — Ambulatory Visit (HOSPITAL_COMMUNITY): Payer: Medicare Other

## 2019-07-03 ENCOUNTER — Encounter (HOSPITAL_BASED_OUTPATIENT_CLINIC_OR_DEPARTMENT_OTHER)
Admission: RE | Disposition: A | Discharge status: Home / Self Care | Payer: Self-pay | Source: Home / Self Care | Attending: Urology

## 2019-07-03 ENCOUNTER — Ambulatory Visit (HOSPITAL_BASED_OUTPATIENT_CLINIC_OR_DEPARTMENT_OTHER)
Admission: RE | Admit: 2019-07-03 | Discharge: 2019-07-03 | Disposition: A | Discharge status: Home / Self Care | Payer: Medicare Other | Attending: Urology | Admitting: Urology

## 2019-07-03 DIAGNOSIS — K219 Gastro-esophageal reflux disease without esophagitis: Secondary | ICD-10-CM | POA: Insufficient documentation

## 2019-07-03 DIAGNOSIS — G4730 Sleep apnea, unspecified: Secondary | ICD-10-CM | POA: Insufficient documentation

## 2019-07-03 DIAGNOSIS — Z8249 Family history of ischemic heart disease and other diseases of the circulatory system: Secondary | ICD-10-CM | POA: Insufficient documentation

## 2019-07-03 DIAGNOSIS — I2510 Atherosclerotic heart disease of native coronary artery without angina pectoris: Secondary | ICD-10-CM | POA: Diagnosis not present

## 2019-07-03 DIAGNOSIS — C61 Malignant neoplasm of prostate: Secondary | ICD-10-CM | POA: Diagnosis not present

## 2019-07-03 DIAGNOSIS — Z7982 Long term (current) use of aspirin: Secondary | ICD-10-CM | POA: Insufficient documentation

## 2019-07-03 DIAGNOSIS — I10 Essential (primary) hypertension: Secondary | ICD-10-CM | POA: Diagnosis not present

## 2019-07-03 DIAGNOSIS — Z79899 Other long term (current) drug therapy: Secondary | ICD-10-CM | POA: Insufficient documentation

## 2019-07-03 DIAGNOSIS — E118 Type 2 diabetes mellitus with unspecified complications: Secondary | ICD-10-CM | POA: Insufficient documentation

## 2019-07-03 DIAGNOSIS — Z7984 Long term (current) use of oral hypoglycemic drugs: Secondary | ICD-10-CM | POA: Diagnosis not present

## 2019-07-03 DIAGNOSIS — E7800 Pure hypercholesterolemia, unspecified: Secondary | ICD-10-CM | POA: Diagnosis not present

## 2019-07-03 DIAGNOSIS — K279 Peptic ulcer, site unspecified, unspecified as acute or chronic, without hemorrhage or perforation: Secondary | ICD-10-CM | POA: Diagnosis not present

## 2019-07-03 HISTORY — DX: Personal history of peptic ulcer disease: Z87.11

## 2019-07-03 HISTORY — DX: Personal history of other diseases of the digestive system: Z87.19

## 2019-07-03 HISTORY — DX: Other cervical disc degeneration, unspecified cervical region: M50.30

## 2019-07-03 HISTORY — PX: SPACE OAR INSTILLATION: SHX6769

## 2019-07-03 HISTORY — DX: Personal history of transient ischemic attack (TIA), and cerebral infarction without residual deficits: Z86.73

## 2019-07-03 HISTORY — DX: Cocaine abuse, in remission: F14.11

## 2019-07-03 HISTORY — DX: Unspecified hemorrhoids: K64.9

## 2019-07-03 HISTORY — PX: RADIOACTIVE SEED IMPLANT: SHX5150

## 2019-07-03 HISTORY — DX: Personal history of colon polyps, unspecified: Z86.0100

## 2019-07-03 HISTORY — DX: Personal history of colonic polyps: Z86.010

## 2019-07-03 HISTORY — DX: Personal history of diseases of the blood and blood-forming organs and certain disorders involving the immune mechanism: Z86.2

## 2019-07-03 HISTORY — DX: Atherosclerosis of aorta: I70.0

## 2019-07-03 LAB — GLUCOSE, CAPILLARY
Glucose-Capillary: 155 mg/dL — ABNORMAL HIGH (ref 70–99)
Glucose-Capillary: 140 mg/dL — ABNORMAL HIGH (ref 70–99)

## 2019-07-03 SURGERY — INSERTION, RADIATION SOURCE, PROSTATE
Anesthesia: General | Site: Perineum

## 2019-07-03 MED ORDER — FENTANYL CITRATE (PF) 100 MCG/2ML IJ SOLN
INTRAMUSCULAR | Status: AC
  Filled 2019-07-03: qty 2

## 2019-07-03 MED ORDER — OXYCODONE HCL 5 MG PO TABS
5.0000 mg | ORAL_TABLET | Freq: Once | ORAL | Status: DC | PRN
  Filled 2019-07-03: qty 1

## 2019-07-03 MED ORDER — MIDAZOLAM HCL 2 MG/2ML IJ SOLN
INTRAMUSCULAR | Status: DC | PRN
  Administered 2019-07-03: 2 mg via INTRAVENOUS

## 2019-07-03 MED ORDER — DEXAMETHASONE SODIUM PHOSPHATE 10 MG/ML IJ SOLN
INTRAMUSCULAR | Status: AC
  Filled 2019-07-03: qty 1

## 2019-07-03 MED ORDER — DEXAMETHASONE SODIUM PHOSPHATE 10 MG/ML IJ SOLN
INTRAMUSCULAR | Status: DC | PRN
  Administered 2019-07-03: 10 mg via INTRAVENOUS

## 2019-07-03 MED ORDER — FENTANYL CITRATE (PF) 100 MCG/2ML IJ SOLN
25.0000 ug | INTRAMUSCULAR | Status: DC | PRN
  Administered 2019-07-03 (×2): 50 ug via INTRAVENOUS
  Filled 2019-07-03: qty 1

## 2019-07-03 MED ORDER — ONDANSETRON HCL 4 MG/2ML IJ SOLN
INTRAMUSCULAR | Status: DC | PRN
  Administered 2019-07-03: 4 mg via INTRAVENOUS

## 2019-07-03 MED ORDER — PHENAZOPYRIDINE HCL 200 MG PO TABS: 200.0000 mg | ORAL_TABLET | Freq: Three times a day (TID) | ORAL | 0 refills | Status: DC | PRN

## 2019-07-03 MED ORDER — LACTATED RINGERS IV SOLN
INTRAVENOUS | Status: DC
  Filled 2019-07-03 (×2): qty 1000

## 2019-07-03 MED ORDER — ONDANSETRON HCL 4 MG/2ML IJ SOLN
4.0000 mg | Freq: Once | INTRAMUSCULAR | Status: DC | PRN
  Filled 2019-07-03: qty 2

## 2019-07-03 MED ORDER — PROPOFOL 10 MG/ML IV BOLUS
INTRAVENOUS | Status: DC | PRN
  Administered 2019-07-03: 40 mg via INTRAVENOUS
  Administered 2019-07-03: 160 mg via INTRAVENOUS

## 2019-07-03 MED ORDER — CIPROFLOXACIN IN D5W 400 MG/200ML IV SOLN
INTRAVENOUS | Status: AC
  Filled 2019-07-03: qty 200

## 2019-07-03 MED ORDER — FLEET ENEMA 7-19 GM/118ML RE ENEM
1.0000 | ENEMA | Freq: Once | RECTAL | Status: DC
  Filled 2019-07-03: qty 1

## 2019-07-03 MED ORDER — LIDOCAINE 2% (20 MG/ML) 5 ML SYRINGE
INTRAMUSCULAR | Status: AC
  Filled 2019-07-03: qty 5

## 2019-07-03 MED ORDER — OXYCODONE-ACETAMINOPHEN 10-325 MG PO TABS: 1.0000 | ORAL_TABLET | ORAL | 0 refills | Status: DC | PRN

## 2019-07-03 MED ORDER — ACETAMINOPHEN 325 MG PO TABS
ORAL_TABLET | ORAL | Status: DC | PRN
  Administered 2019-07-03: 1000 mg via ORAL

## 2019-07-03 MED ORDER — IOHEXOL 300 MG/ML  SOLN
INTRAMUSCULAR | Status: DC | PRN
  Administered 2019-07-03: 7 mL

## 2019-07-03 MED ORDER — KETOROLAC TROMETHAMINE 30 MG/ML IJ SOLN
INTRAMUSCULAR | Status: AC
  Filled 2019-07-03: qty 1

## 2019-07-03 MED ORDER — KETOROLAC TROMETHAMINE 30 MG/ML IJ SOLN
INTRAMUSCULAR | Status: DC | PRN
  Administered 2019-07-03: 30 mg via INTRAVENOUS

## 2019-07-03 MED ORDER — OXYCODONE HCL 5 MG/5ML PO SOLN
5.0000 mg | Freq: Once | ORAL | Status: DC | PRN
  Filled 2019-07-03: qty 5

## 2019-07-03 MED ORDER — LIDOCAINE 2% (20 MG/ML) 5 ML SYRINGE
INTRAMUSCULAR | Status: DC | PRN
  Administered 2019-07-03: 60 mg via INTRAVENOUS

## 2019-07-03 MED ORDER — CIPROFLOXACIN IN D5W 400 MG/200ML IV SOLN
400.0000 mg | INTRAVENOUS | Status: AC
  Administered 2019-07-03: 200 mg via INTRAVENOUS
  Filled 2019-07-03: qty 200

## 2019-07-03 MED ORDER — ACETAMINOPHEN 500 MG PO TABS
ORAL_TABLET | ORAL | Status: AC
  Filled 2019-07-03: qty 2

## 2019-07-03 MED ORDER — MIDAZOLAM HCL 2 MG/2ML IJ SOLN
INTRAMUSCULAR | Status: AC
  Filled 2019-07-03: qty 2

## 2019-07-03 MED ORDER — FENTANYL CITRATE (PF) 100 MCG/2ML IJ SOLN
INTRAMUSCULAR | Status: DC | PRN
  Administered 2019-07-03 (×2): 50 ug via INTRAVENOUS

## 2019-07-03 MED ORDER — ARTIFICIAL TEARS OPHTHALMIC OINT
TOPICAL_OINTMENT | OPHTHALMIC | Status: AC
  Filled 2019-07-03: qty 3.5

## 2019-07-03 MED ORDER — SODIUM CHLORIDE 0.9 % IV SOLN
INTRAVENOUS | Status: AC | PRN
  Administered 2019-07-03: 1000 mL

## 2019-07-03 MED ORDER — ONDANSETRON HCL 4 MG/2ML IJ SOLN
INTRAMUSCULAR | Status: AC
  Filled 2019-07-03: qty 2

## 2019-07-03 MED ORDER — SODIUM CHLORIDE (PF) 0.9 % IJ SOLN
INTRAMUSCULAR | Status: DC | PRN
  Administered 2019-07-03: 3 mL via INTRAVENOUS

## 2019-07-03 SURGICAL SUPPLY — 34 items
BAG URINE DRAIN 2000ML AR STRL (UROLOGICAL SUPPLIES) ×3 IMPLANT
BLADE CLIPPER SENSICLIP SURGIC (BLADE) ×3 IMPLANT
CATH FOLEY 2WAY SLVR  5CC 16FR (CATHETERS) ×3
CATH FOLEY 2WAY SLVR 5CC 16FR (CATHETERS) ×1 IMPLANT
CATH ROBINSON RED A/P 16FR (CATHETERS) IMPLANT
CATH ROBINSON RED A/P 20FR (CATHETERS) ×3 IMPLANT
CLOTH BEACON ORANGE TIMEOUT ST (SAFETY) ×3 IMPLANT
CNTNR URN SCR LID CUP LEK RST (MISCELLANEOUS) ×2 IMPLANT
CONT SPEC 4OZ STRL OR WHT (MISCELLANEOUS) ×6
COVER BACK TABLE 60X90IN (DRAPES) ×3 IMPLANT
COVER MAYO STAND STRL (DRAPES) ×3 IMPLANT
DRSG TEGADERM 4X4.75 (GAUZE/BANDAGES/DRESSINGS) ×3 IMPLANT
DRSG TEGADERM 8X12 (GAUZE/BANDAGES/DRESSINGS) ×3 IMPLANT
GAUZE SPONGE 4X4 12PLY STRL (GAUZE/BANDAGES/DRESSINGS) ×3 IMPLANT
GLOVE BIO SURGEON STRL SZ7.5 (GLOVE) ×6 IMPLANT
GLOVE BIO SURGEON STRL SZ8 (GLOVE) IMPLANT
GLOVE SURG ORTHO 8.5 STRL (GLOVE) IMPLANT
GLOVE SURG SS PI 6.5 STRL IVOR (GLOVE) IMPLANT
GOWN STRL REUS W/TWL XL LVL3 (GOWN DISPOSABLE) ×3 IMPLANT
HOLDER FOLEY CATH W/STRAP (MISCELLANEOUS) ×3 IMPLANT
I SEED AGX100 (Urological Implant) ×231 IMPLANT
IMPL SPACEOAR SYSTEM 10ML (Spacer) ×1 IMPLANT
IMPLANT SPACEOAR SYSTEM 10ML (Spacer) ×3 IMPLANT
IV NS 1000ML (IV SOLUTION) ×6
IV NS 1000ML BAXH (IV SOLUTION) ×2 IMPLANT
KIT TURNOVER CYSTO (KITS) ×3 IMPLANT
MARKER SKIN DUAL TIP RULER LAB (MISCELLANEOUS) ×3 IMPLANT
PACK CYSTO (CUSTOM PROCEDURE TRAY) ×3 IMPLANT
SUT BONE WAX W31G (SUTURE) IMPLANT
SYR 10ML LL (SYRINGE) ×3 IMPLANT
TOWEL OR 17X26 10 PK STRL BLUE (TOWEL DISPOSABLE) ×3 IMPLANT
UNDERPAD 30X30 (UNDERPADS AND DIAPERS) ×6 IMPLANT
WATER STERILE IRR 3000ML UROMA (IV SOLUTION) IMPLANT
WATER STERILE IRR 500ML POUR (IV SOLUTION) ×3 IMPLANT

## 2019-07-03 NOTE — Anesthesia Preprocedure Evaluation (Addendum)
Anesthesia Evaluation  Patient identified by MRN, date of birth, ID band Patient awake    Reviewed: Allergy & Precautions, NPO status , Patient's Chart, lab work & pertinent test results  History of Anesthesia Complications Negative for: history of anesthetic complications  Airway Mallampati: II  TM Distance: >3 FB Neck ROM: Limited   Comment: S/P ACDF limited neck extension Dental  (+) Missing, Dental Advisory Given, Chipped,    Pulmonary sleep apnea ,    Pulmonary exam normal        Cardiovascular hypertension, Pt. 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, unremarkable valves  Reproductive/Obstetrics                           Anesthesia Physical Anesthesia Plan  ASA: III  Anesthesia Plan: General   Post-op Pain Management:    Induction: Intravenous  PONV Risk Score and Plan: 2 and Ondansetron, Dexamethasone, Midazolam and Treatment may vary due to age or medical condition  Airway Management Planned: LMA  Additional Equipment: None  Intra-op Plan:   Post-operative Plan: Extubation in OR  Informed Consent: I have reviewed the patients History and Physical, chart, labs and discussed the procedure including the risks, benefits and alternatives for the proposed anesthesia with the patient or authorized representative who has indicated his/her understanding and  acceptance.     Dental advisory given  Plan Discussed with:   Anesthesia Plan Comments:         Anesthesia Quick Evaluation

## 2019-07-03 NOTE — Discharge Instructions (Signed)
DISCHARGE INSTRUCTIONS FOR PROSTATE SEED IMPLANTATION  Antibiotics You may be given a prescription for an antibiotic to take when you arrive home. If so, be sure to take every tablet in the bottle, even if you are feeling better before the prescription is finished. If you begin itching, notice a rash or start to swell on your trunk, arms, legs and/or throat, immediately stop taking the antibiotic and call your Urologist. Diet Resume your usual diet when you return home. To keep your bowels moving easily and softly, drink prune, apple and cranberry juice at room temperature. You may also take a stool softener, such as Colace, which is available without prescription at local pharmacies. Daily activities No driving or heavy lifting for at least two days after the implant. No bike riding, horseback riding or riding lawn mowers for the first month after the implant. Any strenuous physical activity should be approved by your doctor before you resume it. Sexual relations You may resume sexual relations two weeks after the procedure. A condom should be used for the first two weeks. Your semen may be dark brown or black; this is normal and is related bleeding that may have occurred during the implant. Postoperative swelling Expect swelling and bruising of the scrotum and perineum (the area between the scrotum and anus). Both the swelling and the bruising should resolve in l or 2 weeks. Ice packs and over- the-counter medications such as Tylenol, Advil or Aleve may lessen your discomfort. Postoperative urination Most men experience burning on urination and/or urinary frequency. If this becomes bothersome, contact your Urologist.  Medication can be prescribed to relieve these problems.  It is normal to have some blood in your urine for a few days after the implant. Special instructions related to the seeds It is unlikely that you will pass an Iodine-125 seed in your urine. The seeds are silver in color and are  about as large as a grain of rice. If you pass a seed, do not handle it with your fingers. Use a spoon to place it in an envelope or jar in place this in base occluded area such as the garage or basement for return to the radiation clinic at your convenience.  Contact your doctor for Temperature greater than 101 F Increasing pain Inability to urinate Follow-up  You should have follow up with your urologist and radiation oncologist about 3 weeks after the procedure. General information regarding Iodine seeds Iodine-125 is a low energy radioactive material. It is not deeply penetrating and loses energy at short distances. Your prostate will absorb the radiation. Objects that are touched or used by the patient do not become radioactive. Body wastes (urine and stool) or body fluids (saliva, tears, semen or blood) are not radioactive. The Nuclear Regulatory Commission (NRC) has determined that no radiation precautions are needed for patients undergoing Iodine-125 seed implantation. The NRC states that such patients do not present a risk to the people around them, including young children and pregnant women. However, in keeping with the general principle that radiation exposure should be kept as low reasonably possible, we suggest the following: Children and pets should not sit on the patient's lap for the first two (2) weeks after the implant. Pregnant (or possibly pregnant) women should avoid prolonged, close contact with the patient for the first two (2) weeks after the implant. A distance of three (3) feet is acceptable. At a distance of three (3) feet, there is no limit to the length of time anyone can be   with the patient.     Post Anesthesia Home Care Instructions  Activity: Get plenty of rest for the remainder of the day. A responsible individual must stay with you for 24 hours following the procedure.  For the next 24 hours, DO NOT: -Drive a car -Operate machinery -Drink alcoholic  beverages -Take any medication unless instructed by your physician -Make any legal decisions or sign important papers.  Meals: Start with liquid foods such as gelatin or soup. Progress to regular foods as tolerated. Avoid greasy, spicy, heavy foods. If nausea and/or vomiting occur, drink only clear liquids until the nausea and/or vomiting subsides. Call your physician if vomiting continues.  Special Instructions/Symptoms: Your throat may feel dry or sore from the anesthesia or the breathing tube placed in your throat during surgery. If this causes discomfort, gargle with warm salt water. The discomfort should disappear within 24 hours.      

## 2019-07-03 NOTE — Transfer of Care (Signed)
Immediate Anesthesia Transfer of Care Note  Patient: [name]  Procedure(s) Performed: RADIOACTIVE SEED IMPLANT/BRACHYTHERAPY IMPLANT (N/A Perineum) SPACE OAR INSTILLATION (N/A Perineum)  Patient Location: PACU  Anesthesia Type:General  Level of Consciousness: awake, alert , oriented and patient cooperative  Airway & Oxygen Therapy: Patient Spontanous Breathing and Patient connected to nasal cannula oxygen  Post-op Assessment: Report given to RN and Post -op Vital signs reviewed and stable  Post vital signs: Reviewed and stable  Last Vitals:  Vitals Value Taken Time  BP    Temp    Pulse    Resp    SpO2      Last Pain:  Vitals:   07/03/19 0955  TempSrc: Oral  PainSc: 5          Complications: No apparent anesthesia complications

## 2019-07-03 NOTE — Interval H&P Note (Signed)
History and Physical Interval Note:  07/03/2019 11:13 AM  [name]  has presented today for surgery, with the diagnosis of PROSTATE CANCER.  The various methods of treatment have been discussed with the patient and family. After consideration of risks, benefits and other options for treatment, the patient has consented to  Procedure(s): RADIOACTIVE SEED IMPLANT/BRACHYTHERAPY IMPLANT (N/A) SPACE OAR INSTILLATION (N/A) as a surgical intervention.  The patient's history has been reviewed, patient examined, no change in status, stable for surgery.  I have reviewed the patient's chart and labs.  Questions were answered to the patient's satisfaction.     [name]

## 2019-07-03 NOTE — Interval H&P Note (Signed)
History and Physical Interval Note:  07/03/2019 10:12 AM  [name]  has presented today for surgery, with the diagnosis of PROSTATE CANCER.  The various methods of treatment have been discussed with the patient and family. After consideration of risks, benefits and other options for treatment, the patient has consented to  Procedure(s): RADIOACTIVE SEED IMPLANT/BRACHYTHERAPY IMPLANT (N/A) SPACE OAR INSTILLATION (N/A) as a surgical intervention.  The patient's history has been reviewed, patient examined, no change in status, stable for surgery.  I have reviewed the patient's chart and labs.  Questions were answered to the patient's satisfaction.     Ardis Hughs

## 2019-07-03 NOTE — Op Note (Signed)
Preoperative diagnosis: Clinical stage TI C adenocarcinoma the prostate  Postoperative diagnosis: Same  Procedure:  #1 I-125 prostate seed implantation  #2 cystourethroscopy #3 instillation of SpaceOAR biogel  Surgeon: Louis Meckel, M.D. Radiation Oncologist: Tyler Pita, M.D.  Anesthesia: Gen.   Indications: Patient  was diagnosed with clinical stage TI C prostate cancer. We had extensive discussion with him about treatment options versus. He elected to proceed with seed implantation. He underwent consultation my office as well as with Dr. Tyler Pita. He appeared to understand the advantages disadvantages potential risks of this treatment option. Full informed consent has been obtained. The patient is had preoperative ciprofloxacin. PAS compression boots were placed.   Technique and findings: Patient was brought the operating room where he had successful induction of general anesthesia. He was placed in lithotomy position and prepped and draped in usual manner. Appropriate surgical timeout was performed. Radiation oncology department placed a transrectal ultrasound probe anchoring stand. Foley catheter with contrast in the balloon was inserted without difficulty. Anchoring needles were placed within the prostate. Real-time contouring of the urethra prostate and rectum were performed and the dosing parameters were established. Targeted dose was 145 gray. We then came to the operating suite suite for placement of the needles. A second timeout was performed. All needle passage was done with real-time transrectal ultrasound guidance with the sagittal plane. A total of 26 needles were placed. See implantation itself was done with the robotic implanter. 77 active seeds were implanted. The brachytherapy template was then removed.   A site in the midline was selected on the perineum for placement of an 18 g needle with saline.  The needle was advanced above the rectum and below Denonvillier's  fascia to the mid gland and confirmed to be in the midline on transverse imaging.  One cc of saline was injected confirming appropriate expansion of this space.  A total of 5 cc of saline was then injected to open the space further bilaterally.  The saline syringe was then removed and the SpaceOAR hydrogel was injected with good distribution bilaterally.  A Foley catheter was removed and flexible cystoscopy failed to show any seeds outside the prostate.  The patient was brought to recovery room in stable condition.

## 2019-07-03 NOTE — H&P (Signed)
f/u for Prostate Cancer  HPI: [name] is a 69 year-old male established patient who is here for f/u while on Active Surveillance for Prostate Cancer .  The patient was last seen 12/20.   He was diagnosed with prostate cancer in 04/24/2016. At the time of his prostate cancer diagnosis his PSA was 1.65. The patient's Gleason score at the time of diagnosis was 3+4=7. There were 3 positive cores on his initial biospy. He has had 3 biopsy(ies). The patient's most recent biopsy was approximately 04/18/2019.   The patient has had a prostate MRI. His prostate MRI was in 08/12/2016. This showed: 64mm nodule - right medial apex, non-enhancing.   His most recent PSA is 2.43. This was drawn on approximately 10/13/2018. PSA History: 6/20: 1.91, 12/19: 2.69, 5/19: 2.2, 11/18: 2.03, 1/18: 2.18.   He does have problems with erections. He does not have urinary incontinence. He does not have an abnormal sensation when he needs to urinate. He does not have to strain or bear down to start his urinary stream.   He is not having pain in new locations. He does have a good appetite. His bowels are moving normally. He has not seen blood in his stool since the biopsy. He has not recently had unwanted weight loss.   Biopsy obtained due to right nodule. MRI and repeat biopsy confirmed benign nature of nodule.  No nocturia, strong stream, no incontinence.   Interval: The patient presents today for follow-up of a prostate biopsy. He has now been on active surveillance for nearly 3 years. He underwent his 3rd prostate biopsy. Unfortunately, this did demonstrate some progression of his prostate cancer from Gleason 3+3=6 to Gleason 3+4=7.   The patient has minimal voiding symptoms, and has some erectile dysfunction, but does not currently take any sildenafil. He lives alone, and is not currently in a relationship.     AUA Symptom Score: He never has the sensation of not emptying his bladder completely after finishing  urinating. Less than 20% of the time he has to urinate again fewer than two hours after he has finished urinating. Less than 20% of the time he has to start and stop again several times when he urinates. He never finds it difficult to postpone urination. Less than 20% of the time he has a weak urinary stream. He never has to push or strain to begin urination. He has to get up to urinate 1 time from the time he goes to bed until the time he gets up in the morning.   Calculated AUA Symptom Score: 4    QOL Score: He would feel mostly satisfied if he had to live with his urinary condition the way it is now for the rest of his life.   Calculated QOL Symptom Score: 2    ALLERGIES: No Allergies    MEDICATIONS: Omeprazole 40 mg capsule,delayed release  Sildenafil Citrate 20 mg tablet 2-5 tablets daily as needed  Aspirin 81 MG TABS Oral  Atorvastatin Calcium 10 mg tablet  Baclofen 10 MG Oral Tablet Oral  Gabapentin 300 mg capsule  Glipizide Xl 10 mg tablet, extended release 24 hr  Losartan Potassium 25 mg tablet tablet  Metformin Hcl 850 mg tablet Oral  Oxycodone-Acetaminophen 10 mg-325 mg tablet     GU PSH: Prostate Needle Biopsy - 04/21/2019, 2018, 2018       PSH Notes: Shoulder Surgery Left, Neck Surgery, Rotator Cuff Repair   NON-GU PSH: Surgical Pathology, Gross And Microscopic Examination For Prostate  Needle - 04/21/2019, 2018, 2018     GU PMH: Prostate Cancer - 04/21/2019, - 10/20/2018, - 04/22/2018, - 04/01/2018, - 09/23/2017, - 03/25/2017, - 2018, The patient was diagnosed with low-grade, low-volume prostate cancer. Interestingly, the patient's nodule demonstrated no evidence of disease., - 2018, I went over his pathology report with him today as well as the significance of his Gleason score, number and location of cores positive and percent of cores positive. We then discussed his Partin table results in detail and the significance of these predictions as far as prognosis and need for  further workup are concerned. I then discussed with him the various options available including active surveillance and treatment for cure such as radiation and surgery. We briefly discussed the forms of radiation available. I also gave him written information outlining the disease, its workup and the options for treatment for him to review further. , - 2018 Acute prostatitis - 09/08/2018 Phimosis - 09/08/2018 ED due to arterial insufficiency - 2018 Prostate nodule w/o LUTS (Stable) - 2018, (Stable), He will return in 1 week to go over the results of his prostate biopsy., - 2018, despite the fact that his PSA is normal he has indistinct, firm nodule in the apex of his prostate on the right hand side. Because of that I have discussed proceeding with a prostate biopsy to evaluate this further. We did discuss the fact that certain types of prostate cancer do not produce PSA and I felt strongly about evaluating this further. He is in agreement and will stop taking his daily aspirin., - 2017 Gross hematuria, Gross hematuria - 2016 Dysuria, Dysuria - 2016 Urinary Tract Inf, Unspec site, Pyuria - 2016 Abnormal radiologic findings on diagnositic imaging of other urinary organs, Abnormal findings on diagnostic imaging of urinary organs - 2014 BPH w/LUTS, Benign prostatic hyperplasia with urinary obstruction - 2014 Inflammatory Disease Prostate, Unspec, Prostatitis - 2014 Renal calculus, Kidney stone on left side - 2014      PMH Notes: Bladder wall thickening: The patient was found to have circumferential bladder wall thickening on a CT scan done in 6/13. Renal ultrasound 7/11 - normal kidneys as well as a simple cyst in the left kidney. No renal calculi were seen on his CT scan or ultrasound.    LUTS: He voids with a good stream with no intermittency but does have some daytime frequency as well as nocturia x4. In addition he states that he occasionally will have some stinging with urination. He said he did  have a UTI diagnosed we went to the emergency room in 4/13.    Gross hematuria: Complete evaluation in 11/13 with CT scan, cystoscopy and cytology - negative.  He had an episode of gross hematuria again in 8/16 at which time a CT scan and cystoscopy were negative however he was found to have a culture proven UTI.     NON-GU PMH: Encounter for general adult medical examination without abnormal findings, Encounter for preventive health examination - 2016 Asthma, Asthma - 2014 Gastric ulcer, unspecified as acute or chronic, without hemorrhage or perforation, Gastric Ulcer - 2014 Personal history of other diseases of the circulatory system, History of hypertension - 2014 Personal history of other diseases of the digestive system, History of esophageal reflux - 2014 Personal history of other endocrine, nutritional and metabolic disease, History of diabetes mellitus - 2014    FAMILY HISTORY: Diabetes - Runs In Family hyperlipidemia - Runs In Family Hypertension - Runs In Family   SOCIAL HISTORY: Marital  Status: Divorced Preferred Language: English; Ethnicity: Not Hispanic Or Latino; Race: Black or African American     Notes: History of crack cocaine use, History of marijuana use, Alcohol Use, Caffeine Use, Never A Smoker   REVIEW OF SYSTEMS:    GU Review Male:   Patient denies frequent urination, hard to postpone urination, burning/ pain with urination, get up at night to urinate, leakage of urine, stream starts and stops, trouble starting your stream, have to strain to urinate , erection problems, and penile pain.  Gastrointestinal (Upper):   Patient denies nausea, vomiting, and indigestion/ heartburn.  Gastrointestinal (Lower):   Patient denies diarrhea and constipation.  Constitutional:   Patient denies fever, night sweats, weight loss, and fatigue.  Skin:   Patient denies skin rash/ lesion and itching.  Eyes:   Patient denies blurred vision and double vision.  Ears/ Nose/ Throat:    Patient denies sore throat and sinus problems.  Hematologic/Lymphatic:   Patient denies swollen glands and easy bruising.  Cardiovascular:   Patient denies leg swelling and chest pains.  Respiratory:   Patient denies cough and shortness of breath.  Endocrine:   Patient denies excessive thirst.  Musculoskeletal:   Patient denies back pain and joint pain.  Neurological:   Patient denies headaches and dizziness.  Psychologic:   Patient denies depression and anxiety.   VITAL SIGNS: None   GU PHYSICAL EXAMINATION:    Penis: Small phimotic ring   Prostate: Patient has a small prostate nodule on the right mid area which has been present for a while,   MULTI-SYSTEM PHYSICAL EXAMINATION:       PAST DATA REVIEWED:  Source Of History:  Patient  Lab Test Review:   PSA  Records Review:   Pathology Reports, Previous Doctor Records, Previous Patient Records, POC Tool  Urine Test Review:   Urinalysis   04/21/19 10/13/18 04/22/18 09/17/17 03/18/17 02/11/16 11/25/14 11/21/11  PSA  Total PSA 2.43 ng/mL 1.91 ng/mL 2.69 ng/mL 2.20 ng/mL 2.03 ng/mL 2.18 ng/dl 1.65  1.54     PROCEDURES: None   ASSESSMENT:      ICD-10 Details  1 GU:   Prostate Cancer - C61    PLAN:           Document Letter(s):  Created for Patient: Clinical Summary         Notes:   The patient has had a slight progression of his prostate cancer with a small amount of Gleason 4 in his most recent biopsy upgrading him to an intermediate risk of spread over the next 10 years. I discussed this progression with him in detail. We discussed treatment options. At this point, a technically he is considered intermediate risk and treatment is recommended. However, we did discuss continuing with active surveillance given his small amount of Gleason 4 cancer and repeating his prostate biopsy more frequently.   Ultimately, I recommended that the patient follow up with Dr. Tammi Klippel to discuss brachytherapy and whether that might be a possibility  for him. I also would be curious to see whether Dr. Tammi Klippel felt like active surveillance would be reasonable. The patient is willing to comply with any sort of recommendation.

## 2019-07-03 NOTE — Anesthesia Procedure Notes (Signed)
Procedure Name: LMA Insertion Date/Time: 07/03/2019 11:53 AM Performed by: Wanita Chamberlain, CRNA Pre-anesthesia Checklist: Patient identified, Emergency Drugs available, Suction available and Patient being monitored Patient Re-evaluated:Patient Re-evaluated prior to induction Oxygen Delivery Method: Circle system utilized Preoxygenation: Pre-oxygenation with 100% oxygen Induction Type: IV induction Ventilation: Mask ventilation without difficulty LMA: LMA inserted LMA Size: 5.0 Number of attempts: 1 Placement Confirmation: breath sounds checked- equal and bilateral,  CO2 detector and positive ETCO2 Tube secured with: Tape Dental Injury: Teeth and Oropharynx as per pre-operative assessment

## 2019-07-03 NOTE — Anesthesia Postprocedure Evaluation (Signed)
Anesthesia Post Note  Patient: [name]  Procedure(s) Performed: RADIOACTIVE SEED IMPLANT/BRACHYTHERAPY IMPLANT (N/A Perineum) SPACE OAR INSTILLATION (N/A Perineum)     Patient location during evaluation: PACU Anesthesia Type: General Level of consciousness: awake and alert Pain management: pain level controlled Vital Signs Assessment: post-procedure vital signs reviewed and stable Respiratory status: spontaneous breathing, nonlabored ventilation and respiratory function stable Cardiovascular status: blood pressure returned to baseline and stable Postop Assessment: no apparent nausea or vomiting Anesthetic complications: no    Last Vitals:  Vitals:   07/03/19 1345 07/03/19 1400  BP: (!) 181/90 (!) 151/85  Pulse: 68 66  Resp: 17 16  Temp:    SpO2: 97% 92%    Last Pain:  Vitals:   07/03/19 1400  TempSrc:   PainSc: 7                  Jasten Guyette E Marisue Canion

## 2019-07-07 NOTE — Progress Notes (Signed)
  Radiation Oncology         [phone] ________________________________  Name: [name] MRN: [id number]  Date: 07/07/2019  DOB: Feb 06, 1951       Prostate Seed Implant  [id number], [name], MD  No ref. provider found  DIAGNOSIS: 69 y.o. gentleman with Stage T2a adenocarcinoma of the prostate with Gleason score of 3+4, and PSA of 2.43  PROCEDURE: Insertion of radioactive I-125 seeds into the prostate gland.  RADIATION DOSE: 145 Gy, definitive therapy.  TECHNIQUE: Brennden Thakore Ernsberger was brought to the operating room with the urologist. He was placed in the dorsolithotomy position. He was catheterized and a rectal tube was inserted. The perineum was shaved, prepped and draped. The ultrasound probe was then introduced into the rectum to see the prostate gland.  TREATMENT DEVICE: A needle grid was attached to the ultrasound probe stand and anchor needles were placed.  3D PLANNING: The prostate was imaged in 3D using a sagittal sweep of the prostate probe. These images were transferred to the planning computer. There, the prostate, urethra and rectum were defined on each axial reconstructed image. Then, the software created an optimized 3D plan and a few seed positions were adjusted. The quality of the plan was reviewed using [facility] information for the target and the following two organs at risk:  Urethra and Rectum.  Then the accepted plan was printed and handed off to the radiation therapist.  Under my supervision, the custom loading of the seeds and spacers was carried out and loaded into sealed vicryl sleeves.  These pre-loaded needles were then placed into the needle holder.Marland Kitchen  PROSTATE VOLUME STUDY:  Using transrectal ultrasound the volume of the prostate was verified to be 39.3 cc.  SPECIAL TREATMENT PROCEDURE/SUPERVISION AND HANDLING: The pre-loaded needles were then delivered under sagittal guidance. A total of 26 needles were used to deposit 77 seeds in the prostate gland. The  individual seed activity was 0.379 mCi.  SpaceOAR:  Yes  COMPLEX SIMULATION: At the end of the procedure, an anterior radiograph of the pelvis was obtained to document seed positioning and count. Cystoscopy was performed to check the urethra and bladder.  MICRODOSIMETRY: At the end of the procedure, the patient was emitting 0.068 mR/hr at 1 meter. Accordingly, he was considered safe for hospital discharge.  PLAN: The patient will return to the radiation oncology clinic for post implant CT dosimetry in three weeks.   ________________________________  Sheral Apley Tammi Klippel, M.D.

## 2019-07-15 ENCOUNTER — Other Ambulatory Visit: Payer: Self-pay | Admitting: Urology

## 2019-07-15 ENCOUNTER — Telehealth: Payer: Self-pay | Admitting: *Deleted

## 2019-07-15 MED ORDER — LORAZEPAM 1 MG PO TABS: 1.0000 mg | ORAL_TABLET | ORAL | 0 refills | Status: DC | PRN

## 2019-07-15 NOTE — Telephone Encounter (Signed)
CALLED PATIENT TO INFORM THAT SCRIPT IS READY FOR PICK-UP AT HIS DRUGSTORE, AND ALSO TO REMIND HIM OF HIS POST SEED APPTS. FOR 07-16-19, SPOKE WITH PATIENT AND HE IS AWARE OF THESE APPTS.

## 2019-07-16 ENCOUNTER — Ambulatory Visit
Admission: RE | Admit: 2019-07-16 | Discharge: 2019-07-16 | Disposition: A | Discharge status: Home / Self Care | Payer: Medicare Other | Source: Ambulatory Visit | Attending: Radiation Oncology | Admitting: Radiation Oncology

## 2019-07-16 ENCOUNTER — Ambulatory Visit (HOSPITAL_COMMUNITY)
Admission: RE | Admit: 2019-07-16 | Discharge: 2019-07-16 | Disposition: A | Discharge status: Home / Self Care | Payer: Medicare Other | Source: Ambulatory Visit | Attending: Urology | Admitting: Urology

## 2019-07-16 ENCOUNTER — Ambulatory Visit
Admission: RE | Admit: 2019-07-16 | Discharge: 2019-07-16 | Disposition: A | Discharge status: Home / Self Care | Payer: Medicare Other | Source: Ambulatory Visit | Attending: Urology | Admitting: Urology

## 2019-07-16 ENCOUNTER — Encounter: Payer: Self-pay | Admitting: Radiation Oncology

## 2019-07-16 ENCOUNTER — Other Ambulatory Visit: Payer: Self-pay

## 2019-07-16 ENCOUNTER — Encounter: Payer: Self-pay | Admitting: Medical Oncology

## 2019-07-16 DIAGNOSIS — Z7984 Long term (current) use of oral hypoglycemic drugs: Secondary | ICD-10-CM | POA: Diagnosis not present

## 2019-07-16 DIAGNOSIS — Z7982 Long term (current) use of aspirin: Secondary | ICD-10-CM | POA: Diagnosis not present

## 2019-07-16 DIAGNOSIS — C61 Malignant neoplasm of prostate: Secondary | ICD-10-CM | POA: Insufficient documentation

## 2019-07-16 DIAGNOSIS — Z923 Personal history of irradiation: Secondary | ICD-10-CM | POA: Diagnosis not present

## 2019-07-16 DIAGNOSIS — Z79899 Other long term (current) drug therapy: Secondary | ICD-10-CM | POA: Diagnosis not present

## 2019-07-16 NOTE — Addendum Note (Signed)
Encounter addended by: Aurea Graff on: 07/16/2019 5:09 PM  Actions taken: Care Teams modified

## 2019-07-16 NOTE — Progress Notes (Signed)
Radiation Oncology         [phone] ________________________________  Name: [name] MRN: [id number]  Date: 07/16/2019  DOB: March 06, 1951  Post-Seed Follow-Up Visit Note  CC: Ria Bush, MD  Ria Bush, MD  Diagnosis:   69 y.o. gentleman with Stage T2a adenocarcinoma of the prostate with Gleason score of 3+4, and PSA of 2.43    ICD-10-CM   1. Malignant neoplasm of prostate ([place])  C61     Interval Since Last Radiation:  2 weeks 07/03/19:  Insertion of radioactive I-125 seeds into the prostate gland; 145 Gy, definitive therapy with placement of SpaceOAR gel.  Narrative:  The patient returns today for routine follow-up.  He is complaining of increased urinary frequency and urinary hesitation symptoms. He filled out a questionnaire regarding urinary function today providing and overall IPSS score of 17 characterizing his symptoms as moderate with increased frequency, urgency, hesitancy, intermittency, weak stream and incomplete emptying which are gradually improving.  His pre-implant score was 10. He denies any abdominal pain or bowel symptoms.  ALLERGIES:  is allergic to glimepiride and menthol (topical analgesic).  Meds: Current Outpatient Medications  Medication Sig Dispense Refill  . aspirin EC 81 MG tablet Take 81 mg by mouth daily.    Marland Kitchen atorvastatin (LIPITOR) 80 MG tablet Take 1 tablet (80 mg total) by mouth daily. (Patient taking differently: Take 80 mg by mouth daily. ) 90 tablet 3  . baclofen (LIORESAL) 10 MG tablet TAKE 1 TABLET BY MOUTH AT  BEDTIME AS NEEDED FOR  MUSCLE SPASM(S) 90 tablet 0  . empagliflozin (JARDIANCE) 10 MG TABS tablet Take 10 mg by mouth daily. (Patient taking differently: Take 10 mg by mouth daily. ) 90 tablet 3  . fluticasone (FLONASE) 50 MCG/ACT nasal spray Place 2 sprays into both nostrils daily. (Patient taking differently: Place 2 sprays into both nostrils daily. ) 48 g 1  . gabapentin (NEURONTIN) 100 MG capsule TAKE 1 CAPSULE  BY MOUTH TWO TIMES DAILY (Patient taking differently: Take 100 mg by mouth daily. TAKE 1 CAPSULE PER DAY BY MOUTH) 180 capsule 3  . glipiZIDE (GLUCOTROL XL) 5 MG 24 hr tablet Take 1 tablet (5 mg total) by mouth daily with breakfast. (Patient taking differently: Take 5 mg by mouth daily with breakfast. ) 90 tablet 1  . glucose blood (ONETOUCH ULTRA) test strip USE WITH METER TO CHECK  SUGAR TWICE DAILY. 50 strip 0  . LORazepam (ATIVAN) 1 MG tablet Take 1 tablet (1 mg total) by mouth as needed for anxiety (30 minutes prior to MRI and may repeat once, just prior to scan if needed). 2 tablet 0  . losartan (COZAAR) 50 MG tablet Take 1 tablet (50 mg total) by mouth daily. (Patient taking differently: Take 50 mg by mouth daily. ) 90 tablet 3  . metFORMIN (GLUCOPHAGE) 1000 MG tablet Take 1 tablet (1,000 mg total) by mouth 2 (two) times daily with a meal. (Patient taking differently: Take 1,000 mg by mouth 2 (two) times daily with a meal. ) 180 tablet 3  . omeprazole (PRILOSEC) 40 MG capsule TAKE 1 CAPSULE(40 MG) BY MOUTH DAILY (Patient taking differently: Take 40 mg by mouth daily. ) 90 capsule 3  . oxyCODONE-acetaminophen (PERCOCET) 10-325 MG tablet Take 1 tablet by mouth every 4 (four) hours as needed for pain. 10 tablet 0  . phenazopyridine (PYRIDIUM) 200 MG tablet Take 1 tablet (200 mg total) by mouth 3 (three) times daily as needed for pain. 10 tablet 0  No current facility-administered medications for this encounter.    Physical Findings: In general this is a well appearing African American male in no acute distress. He's alert and oriented x4 and appropriate throughout the examination. Cardiopulmonary assessment is negative for acute distress and he exhibits normal effort.   Lab Findings: Lab Results  Component Value Date   WBC 4.5 06/30/2019   HGB 13.2 06/30/2019   HCT 40.1 06/30/2019   MCV 91.6 06/30/2019   PLT 273 06/30/2019    Radiographic Findings:  Patient underwent CT imaging in our  clinic for post implant dosimetry. The CT will be reviewed by Dr. Tammi Klippel to confirm there is an adequate distribution of radioactive seeds throughout the prostate gland and ensure that there are no seeds in or near the rectum. His scheduled for prostate MRI at 3pm this afternoon and those images will be fused with his CT images for further evaluation. We suspect the final radiation plan and dosimetry will show appropriate coverage of the prostate gland. He understands that we will call and inform him of any unexpected findings on further review of his imaging and dosimetry.  Impression/Plan: The patient is recovering from the effects of radiation. His urinary symptoms should gradually improve over the next 4-6 months. We talked about this today. He is encouraged by his improvement already and is otherwise pleased with his outcome. We also talked about long-term follow-up for prostate cancer following seed implant. He understands that ongoing PSA determinations and digital rectal exams will help perform surveillance to rule out disease recurrence. He has a follow up appointment scheduled with Jiles Crocker, NP on 07/23/19. He understands what to expect with his PSA measures. Patient was also educated today about some of the long-term effects from radiation including a small risk for rectal bleeding and possibly erectile dysfunction. We talked about some of the general management approaches to these potential complications. However, I did encourage the patient to contact our office or return at any point if he has questions or concerns related to his previous radiation and prostate cancer.    Nicholos Johns, PA-C

## 2019-07-16 NOTE — Progress Notes (Signed)
Patient denies hematuria dysuria when starting stream occassionally urine stream strong however takes a while to fully empty bladder but he does fully empty bladder has urgency and frequency with no leakage. Nocturia x2. Follow up with urologist July 23, 2019.

## 2019-07-17 NOTE — Progress Notes (Signed)
  Radiation Oncology         [phone] ________________________________  Name: [name] MRN: [id number]  Date: 07/16/2019  DOB: [birth year]  COMPLEX SIMULATION NOTE  NARRATIVE:  The patient was brought to the [facility] today following prostate seed implantation approximately one month ago.  Identity was confirmed.  All relevant records and images related to the planned course of therapy were reviewed.  Then, the patient was set-up supine.  CT images were obtained.  The CT images were loaded into the planning software.  Then the prostate and rectum were contoured.  Treatment planning then occurred.  The implanted iodine 125 seeds were identified by the physics staff for projection of radiation distribution  I have requested : 3D Simulation  I have requested a DVH of the following structures: Prostate and rectum.    ________________________________  Sheral Apley Tammi Klippel, M.D.

## 2019-07-22 ENCOUNTER — Encounter: Payer: Self-pay | Admitting: Radiation Oncology

## 2019-07-22 DIAGNOSIS — C61 Malignant neoplasm of prostate: Secondary | ICD-10-CM | POA: Diagnosis not present

## 2019-08-05 NOTE — Progress Notes (Signed)
  Radiation Oncology         [phone] ________________________________  Name: CASTIN CAMPILLO MRN: [id number]  Date: 07/22/2019  DOB: October 09, 1950  3D Planning Note   Prostate Brachytherapy Post-Implant Dosimetry  Diagnosis: 69 y.o. gentleman with Stage T2a adenocarcinoma of the prostate with Gleason score of 3+4, and PSA of 2.43  Narrative: On a previous date, SAAID YOUNGBLUT returned following prostate seed implantation for post implant planning. He underwent CT scan complex simulation to delineate the three-dimensional structures of the pelvis and demonstrate the radiation distribution.  Since that time, the seed localization, and complex isodose planning with dose volume histograms have now been completed.  Results:   Prostate Coverage - The dose of radiation delivered to the 90% or more of the prostate gland (D90) was 91.57% of the prescription dose. This exceeds our goal of greater than 90%. Rectal Sparing - The volume of rectal tissue receiving the prescription dose or higher was 0.22 cc. This falls under our thresholds tolerance of 1.0 cc.  Impression: The prostate seed implant appears to show adequate target coverage and appropriate rectal sparing.  Plan:  The patient will continue to follow with urology for ongoing PSA determinations. I would anticipate a high likelihood for local tumor control with minimal risk for rectal morbidity.  ________________________________  Sheral Apley Tammi Klippel, M.D.

## 2019-08-21 ENCOUNTER — Other Ambulatory Visit: Payer: Self-pay | Admitting: Pharmacist

## 2019-08-21 NOTE — Patient Outreach (Signed)
[facility] ([facility])  [facility] Quality Pharmacy Team    [facility] pharmacy case will be closed as our team is transitioning from the [facility] Care Management Department into the [facility] Quality Department and will no longer be using [facility] for documentation purposes.   [facility] pharmacy technician will continue to assist patient with medication assistance program applications until complete.     Abb Gobert, PharmD, BCPS Clinical Pharmacist [facility] [phone]      

## 2019-08-31 ENCOUNTER — Other Ambulatory Visit: Payer: Self-pay | Admitting: Family Medicine

## 2019-09-01 NOTE — Telephone Encounter (Signed)
Rx was last refilled 07/02/19 for #90 with 0 refills. Last OV was 04/30/19 - and has upcoming CPE on 11/03/19.  Is this ok to refill?

## 2019-09-03 DIAGNOSIS — Z5181 Encounter for therapeutic drug level monitoring: Secondary | ICD-10-CM | POA: Diagnosis not present

## 2019-09-03 DIAGNOSIS — M961 Postlaminectomy syndrome, not elsewhere classified: Secondary | ICD-10-CM | POA: Diagnosis not present

## 2019-09-03 DIAGNOSIS — Z79891 Long term (current) use of opiate analgesic: Secondary | ICD-10-CM | POA: Diagnosis not present

## 2019-09-03 DIAGNOSIS — M5030 Other cervical disc degeneration, unspecified cervical region: Secondary | ICD-10-CM | POA: Diagnosis not present

## 2019-09-03 DIAGNOSIS — Z79899 Other long term (current) drug therapy: Secondary | ICD-10-CM | POA: Diagnosis not present

## 2019-09-10 ENCOUNTER — Ambulatory Visit: Payer: Medicare Other | Admitting: Cardiology

## 2019-09-10 ENCOUNTER — Other Ambulatory Visit: Payer: Self-pay

## 2019-09-10 ENCOUNTER — Encounter: Payer: Self-pay | Admitting: Cardiology

## 2019-09-10 VITALS — BP 144/80 | HR 81 | Ht 67.0 in | Wt 238.0 lb

## 2019-09-10 DIAGNOSIS — I10 Essential (primary) hypertension: Secondary | ICD-10-CM | POA: Diagnosis not present

## 2019-09-10 DIAGNOSIS — R079 Chest pain, unspecified: Secondary | ICD-10-CM

## 2019-09-10 DIAGNOSIS — E7800 Pure hypercholesterolemia, unspecified: Secondary | ICD-10-CM

## 2019-09-10 DIAGNOSIS — I2510 Atherosclerotic heart disease of native coronary artery without angina pectoris: Secondary | ICD-10-CM

## 2019-09-10 DIAGNOSIS — I77810 Thoracic aortic ectasia: Secondary | ICD-10-CM

## 2019-09-10 NOTE — Progress Notes (Signed)
Cardiology Office Note    Date:  09/10/2019   ID:  CASWELL KALEN, DOB 07-17-1950, MRN [id number]  PCP:  Ria Bush, MD  Cardiologist:  Fransico Him, MD   Chief Complaint  Patient presents with  . Coronary Artery Disease  . Hypertension  . Hyperlipidemia    History of Present Illness:  [name] is a 69 y.o. male with a long hx of chest pain with normal coronary arteries in 2008 and felt to be due to noncardiac etiology and had been lost to followup with Dr. Johnsie Cancel.  He was admitted  In 08/2018 with CP  and underwent repeat cath showing  nnobstructive CAD with 25% LAD.  He has a hx of HTN, type 2 DM, cocaine abuse, HLD.  He was seen again in the ER with recurrent CP that he described as a sharp pain in the left chest, left arm and left leg.  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  He only had 1 episode of CP in the setting of hypertensive urgency.   He is here today for followup and is doing well.  He still complains of chronic stinging chest pain that is typical of what he has had in the past that he has had for years.  It is nonexertional and only lasts a few moments and feels like a stinging in his chest.  He gets pain and numbness in his arm and numbness in his hand when laying down and has a hx of cervical spine disease.  He denies any anginal chest pain or pressure, SOB, DOE (except when bending over), PND, orthopnea, LE edema, dizziness or syncope. He occasionally notices some skipped heart beats that only last a few seconds.  He is compliant with his meds and is tolerating meds with no SE.    Past Medical History:  Diagnosis Date  . Allergic rhinitis   . Anxiety   . Atherosclerosis of aorta ([place])   . CAD (coronary artery disease) cardiologist-- dr Tressia Miners Deontae Robson   long hx atypical chest pain--- cardiac cath 08-24-2002 in epic showed normal coronaries;   03-17-2007  nuclear stress test showed  ?small infarct , ef 60%;  cath done 12-31-2007 for recurrent CP showed nonob cad;   08-27-2018 stress test showed intermediate risk w/ apex ischemia, nuclear ef 61%;   cath done 08-29-2018 showed Nonobstructive 25% plaque in LAD   . Chronic neck pain    narcotic dependant  . DDD (degenerative disc disease), cervical   . Depression   . Diabetic neuropathy ([place])   . Diverticulosis 2013   by CT scan  . Erectile dysfunction 08/01/2015  . Essential hypertension 01/01/2008  . Fatty liver 07/31/2018   By [place] 07/2018,  followed by pcp  . GERD (gastroesophageal reflux disease)   . Hemorrhoids   . History of anemia   . History of cocaine abuse ([place])    per pt quit 05/ 2004  . History of cocaine use 2004  . History of colon polyps   . History of epididymitis   . History of esophageal stricture    s/p  dilatation 2009;  12-30-2018  . History of gastric ulcer    12-30-2018  EGD showed non-bleeding gastric ulcer  . History of prostatitis 08/2011  . History of transient ischemic attack (TIA)    remote hx , related to cocaine abuse which pt quit 2005  . Hyperlipidemia   .  Hypertension    Essential  . OSA (obstructive sleep apnea)    study 05-07-2008 in epic , mild ,  pt does not use cpap  . Pain due to onychomycosis of toenails of both feet 10/15/2018  . Prostate cancer [facility]) urologist--- dr herrick/  oncologist--- dr Tammi Klippel   first dx 01/ 2018 w/ Stage T2a and Gleason 3+3 , active survillance;  repear bx 04-21-2019 Gleason 3+4,  scheduled for brachytherapy 07-03-2019  . T2DM (type 2 diabetes mellitus) ([place])    followed by pcp---  (03-04-201  checks blood sugar 3 times daily,  fasting sugar's-- 140-150    Past Surgical History:  Procedure Laterality Date  . ANTERIOR CERVICAL DECOMP/DISCECTOMY FUSION  06-24-2008  and 10-28-2008 @[facility]    06-25-2008  C7 -- T1;    10-28-2008  C5 -- C7  . CARDIAC CATHETERIZATION  08-24-2002   dr gamble  @[facility]    normal coronaries  . CARDIAC CATHETERIZATION  12-31-2007     dr Johnsie Cancel   nonobstructive CAD  . CARPAL TUNNEL RELEASE Right 2000  approx.  Marland Kitchen CATARACT EXTRACTION W/ INTRAOCULAR LENS  IMPLANT, BILATERAL  2016  . COLONOSCOPY  10/11/2011   severe diverticulosis, int hemorrhoids, rec rpt 5 yrs Carlean Purl)  . COLONOSCOPY  12/2018   benign rectal polyp Carlean Purl)  . DIRECT LARYNGOSCOPY  11/02/2008   @[facility]    Extubation under anesthesia post op complication from ACDF  . ESOPHAGOGASTRODUODENOSCOPY  10/11/2011   WNL Carlean Purl)  . ESOPHAGOGASTRODUODENOSCOPY  09/11-2018   gastric ulcer, gastritis, dilated esophageal stenosis, limit NSAIDs continue PPI, no f/u planned Carlean Purl)  . EXCISION MASS NECK N/A 08/29/2016   Procedure: EXCISION MASS POSTERIOR NECK;  Surgeon: Erroll Luna, MD;  Location: [facility];  Service: General;  Laterality: N/A;  . HEMATOMA EVACUATION  10/29/2008   @[facility]    post op ACDF 10-28-2008  . LAPAROSCOPIC INGUINAL HERNIA REPAIR Bilateral 11-22-2006   @[facility]    AND EXCISION RLQ ABDOMINAL LIPOMA  . LEFT HEART CATH AND CORONARY ANGIOGRAPHY N/A 08/29/2018   Procedure: LEFT HEART CATH AND CORONARY ANGIOGRAPHY;  Surgeon: Jettie Booze, MD;  Location: [facility];  Service: Cardiovascular;  Laterality: N/A;  . RADIOACTIVE SEED IMPLANT N/A 07/03/2019   Procedure: RADIOACTIVE SEED IMPLANT/BRACHYTHERAPY IMPLANT;  Surgeon: Ardis Hughs, MD;  Location: [facility];  Service: Urology;  Laterality: N/A;  . SHOULDER ARTHROSCOPY WITH SUBACROMIAL DECOMPRESSION AND OPEN ROTATOR C Left 01/23/2013   Procedure: LEFT SHOULDER ARTHROSCOPY WITH SUBACROMIAL DECOMPRESSION AND MINI OPEN ROTATOR CUFF REPAIR, AND OPEN DISTAL CLAVICLE RESECTION;  Surgeon: Augustin Schooling, MD;  Location: [facility];  Service: Orthopedics;  Laterality: Left;  . SHOULDER SURGERY Right 05/2011  . SPACE OAR INSTILLATION N/A 07/03/2019   Procedure: SPACE OAR INSTILLATION;  Surgeon: Ardis Hughs, MD;  Location[facility];  Service: Urology;   Laterality: N/A;  . TONSILLECTOMY  child  . TRIGGER FINGER RELEASE Right 12-12-2005   @[facility]    Right long finger    Current Medications: Current Meds  Medication Sig  . aspirin EC 81 MG tablet Take 81 mg by mouth daily.  Marland Kitchen atorvastatin (LIPITOR) 80 MG tablet Take 80 mg by mouth daily.  . baclofen (LIORESAL) 10 MG tablet TAKE 1 TABLET BY MOUTH AT  BEDTIME AS NEEDED FOR  MUSCLE SPASM(S)  . carvedilol (COREG) 3.125 MG tablet Take 3.125 mg by mouth 2 (two) times daily.  . empagliflozin (JARDIANCE) 10 MG TABS tablet Take 10 mg by mouth daily.  Marland Kitchen FLOMAX  0.4 MG CAPS capsule Take 0.4 mg by mouth daily.  . fluticasone (FLONASE) 50 MCG/ACT nasal spray Place 2 sprays into both nostrils daily.  Marland Kitchen gabapentin (NEURONTIN) 100 MG capsule TAKE 1 CAPSULE BY MOUTH TWO TIMES DAILY  . glipiZIDE (GLUCOTROL) 10 MG tablet Take 10 mg by mouth daily.  Marland Kitchen glucose blood (ONETOUCH ULTRA) test strip USE WITH METER TO CHECK  SUGAR TWICE DAILY.  Marland Kitchen LORazepam (ATIVAN) 1 MG tablet Take 1 tablet (1 mg total) by mouth as needed for anxiety (30 minutes prior to MRI and may repeat once, just prior to scan if needed).  . losartan (COZAAR) 50 MG tablet Take 1 tablet (50 mg total) by mouth daily.  . metFORMIN (GLUCOPHAGE) 1000 MG tablet Take 1 tablet (1,000 mg total) by mouth 2 (two) times daily with a meal.  . omeprazole (PRILOSEC) 40 MG capsule TAKE 1 CAPSULE(40 MG) BY MOUTH DAILY  . oxyCODONE-acetaminophen (PERCOCET) 10-325 MG tablet Take 1 tablet by mouth every 4 (four) hours as needed for pain.  . phenazopyridine (PYRIDIUM) 200 MG tablet Take 1 tablet (200 mg total) by mouth 3 (three) times daily as needed for pain.    Allergies:   Glimepiride and Menthol (topical analgesic)   Social History   Socioeconomic History  . Marital status: Single    Spouse name: Not on file  . Number of children: 1  . Years of education: Not on file  . Highest education level: Not on file  Occupational History  . Occupation: [employer] previously    Fish farm manager: UNEMPLOYED    Comment: Now on disability after neck surgery  Tobacco Use  . Smoking status: Never Smoker  . Smokeless tobacco: Never Used  Substance and Sexual Activity  . Alcohol use: No    Alcohol/week: 0.0 standard drinks  . Drug use: Not Currently    Comment: H/O marijuana (last 2005); Cocaine 5/04  . Sexual activity: Not on file  Other Topics Concern  . 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   Activity: no regular exercise, wants to start walking   Diet: good water, fruits/vegetables daily.      Retired   1 daughter and 1 granddaughter   Social Determinants of Radio broadcast assistant Strain:   . Difficulty of Paying Living Expenses:   Food Insecurity:   . Worried About Charity fundraiser in the Last Year:   . Arboriculturist in the Last Year:   Transportation Needs:   . Film/video editor (Medical):   Marland Kitchen Lack of Transportation (Non-Medical):   Physical Activity:   . Days of Exercise per Week:   . Minutes of Exercise per Session:   Stress:   . Feeling of Stress :   Social Connections:   . Frequency of Communication with Friends and Family:   . Frequency of Social Gatherings with Friends and Family:   . Attends Religious Services:   . Active Member of Clubs or Organizations:   . Attends Archivist Meetings:   Marland Kitchen Marital Status:      Family History:  The patient's family history includes Cancer in his paternal grandfather and paternal uncle; Diabetes in his brother, brother, brother, father, mother, and sister; Heart disease in his maternal uncle; Hyperlipidemia in his mother; Hypertension in his brother,  brother, mother, and sister.   ROS:   Please see the history of present illness.    ROS All other systems reviewed and are negative.  No  flowsheet data found.   PHYSICAL EXAM:   VS:  BP (!) 144/80   Pulse 81   Ht 5\' 7"  (1.702 m)   Wt 238 lb (108 kg)   SpO2 98%   BMI 37.28 kg/m    GEN: Well nourished, well developed, in no acute distress  HEENT: normal  Neck: no JVD, carotid bruits, or masses Cardiac: RRR; no murmurs, rubs, or gallops,no edema.  Intact distal pulses bilaterally.  Respiratory:  clear to auscultation bilaterally, normal work of breathing GI: soft, nontender, nondistended, + BS MS: no deformity or atrophy  Skin: warm and dry, no rash Neuro:  Alert and Oriented x 3, Strength and sensation are intact Psych: euthymic mood, full affect  Wt Readings from Last 3 Encounters:  09/10/19 238 lb (108 kg)  07/03/19 237 lb (107.5 kg)  06/30/19 238 lb (108 kg)      Studies/Labs Reviewed:   EKG:  EKG is ordered today and showed  Recent Labs: 06/30/2019: ALT 23; BUN 11; Creatinine, Ser 0.86; Hemoglobin 13.2; Platelets 273; Potassium 3.8; Sodium 141   Lipid Panel    Component Value Date/Time   CHOL 95 10/16/2018 1125   TRIG 70.0 10/16/2018 1125   HDL 35.50 (L) 10/16/2018 1125   CHOLHDL 3 10/16/2018 1125   VLDL 14.0 10/16/2018 1125   LDLCALC 45 10/16/2018 1125   LDLDIRECT 104 (H) 12/26/2012 1443    Additional studies/ records that were reviewed today include:  none  ASSESSMENT:    1. Chest pain of uncertain etiology   2. Coronary artery disease involving native coronary artery of native heart without angina pectoris   3. Essential hypertension   4. HYPERCHOLESTEROLEMIA, PURE   5. Ascending aorta dilatation (HCC)      PLAN:  In order of problems listed above:  1. Chest pain -atypical and likely related to cervical disc disease -cath in May 2020 with 25% LAD mid but no other dz.   -He has a long hx of chronic CP with normal cath in 2008 -I do not think his pain is cardiac.  -2D echo 02/2019 showed normal LVF  2.  Nonobstructive CAD -25% LAD at cath in May -continue ASA 81mg  daily and  statin  3.  HTN -Bp controlled on exam today -followed by PCP -Continue Losartan 50mg  daily and Carvedilol 3.125mg  BID  4.  HLD -LDL goal < 70 -LDL 45 in June 2020 -followed by PCP -continue atorvastatin 80mg  daily  5.  Ascending aortic aneurysm -6mm by 2D echo a year ago -repeat echo  -continue statin   Medication Adjustments/Labs and Tests Ordered: Current medicines are reviewed at length with the patient today.  Concerns regarding medicines are outlined above.  Medication changes, Labs and Tests ordered today are listed in the Patient Instructions below.  There are no Patient Instructions on file for this visit.   Signed, Fransico Him, MD  09/10/2019 1:22 PM    [facility] [address], [place], [place]  [address] Phone: [phone]; Fax: [phone]

## 2019-09-10 NOTE — Patient Instructions (Signed)
Medication Instructions:  Your physician recommends that you continue on your current medications as directed. Please refer to the Current Medication list given to you today.  *If you need a refill on your cardiac medications before your next appointment, please call your pharmacy*  Testing/Procedures: Your physician has requested that you have an echocardiogram. Echocardiography is a painless test that uses sound waves to create images of your heart. It provides your doctor with information about the size and shape of your heart and how well your heart's chambers and valves are working. This procedure takes approximately one hour. There are no restrictions for this procedure.  Follow-Up: At [facility], you and your health needs are our priority.  As part of our continuing mission to provide you with exceptional heart care, we have created designated Provider Care Teams.  These Care Teams include your primary Cardiologist (physician) and Advanced Practice Providers (APPs -  Physician Assistants and Nurse Practitioners) who all work together to provide you with the care you need, when you need it.  We recommend signing up for the patient portal called "MyChart".  Sign up information is provided on this After Visit Summary.  MyChart is used to connect with patients for Virtual Visits (Telemedicine).  Patients are able to view lab/test results, encounter notes, upcoming appointments, etc.  Non-urgent messages can be sent to your provider as well.   To learn more about what you can do with MyChart, go to [email or web address].    Your next appointment:   1 year(s)  The format for your next appointment:   In Person  Provider:   You may see Traci Turner, MD or one of the following Advanced Practice Providers on your designated Care Team:    Dayna Dunn, PA-C  Michele Lenze, PA-C 

## 2019-09-10 NOTE — Addendum Note (Signed)
Addended by: Antonieta Iba on: 09/10/2019 01:28 PM   Modules accepted: Orders

## 2019-09-23 ENCOUNTER — Ambulatory Visit: Payer: Medicare Other | Admitting: Podiatry

## 2019-10-05 ENCOUNTER — Ambulatory Visit (HOSPITAL_COMMUNITY): Payer: Medicare Other

## 2019-10-05 ENCOUNTER — Other Ambulatory Visit: Payer: Self-pay

## 2019-10-06 ENCOUNTER — Other Ambulatory Visit: Payer: Self-pay | Admitting: Family Medicine

## 2019-10-27 ENCOUNTER — Telehealth: Payer: Self-pay | Admitting: Cardiology

## 2019-10-27 NOTE — Telephone Encounter (Signed)
He has had two normal caths I don't think he needs regular cardiology f/u

## 2019-10-27 NOTE — Telephone Encounter (Signed)
I will forward call to both the nurse for Dr. Johnsie Cancel and the nurse for Dr. Radford Pax to discuss further with the MD's. I will send note to MD's as well.

## 2019-10-27 NOTE — Telephone Encounter (Signed)
I will forward over to Dr. Radford Pax and her nurse Carly.

## 2019-10-27 NOTE — Telephone Encounter (Signed)
Pt would like to transfer care from Dr. Radford Pax to Dr. Johnsie Cancel. The patient said he had been a patient of Dr. Johnsie Cancel but when he came back in 2020 Dr. Johnsie Cancel was not available and he saw Dr. Radford Pax.  Please let the patient know what the office decides

## 2019-10-28 NOTE — Telephone Encounter (Signed)
Spoke with the patient and advised him that if his PCP can follow his aortic aneurysm then he will not need a yearly cardiac follow up. Patient verbalized understanding. He has an appointment with PCP next week.  Patient states that if he does end up having problems and needing to see cardiology he would like to go back to seeing Dr. Johnsie Cancel.

## 2019-10-28 NOTE — Telephone Encounter (Signed)
He would need to have his aortic aneurysm followed but that is only cardiac issue.  If PCP can follow that then he does not need cardiac followup yearly

## 2019-10-29 ENCOUNTER — Ambulatory Visit (INDEPENDENT_AMBULATORY_CARE_PROVIDER_SITE_OTHER): Payer: Medicare Other

## 2019-10-29 ENCOUNTER — Other Ambulatory Visit: Payer: Self-pay | Admitting: Family Medicine

## 2019-10-29 DIAGNOSIS — E7800 Pure hypercholesterolemia, unspecified: Secondary | ICD-10-CM

## 2019-10-29 DIAGNOSIS — C61 Malignant neoplasm of prostate: Secondary | ICD-10-CM

## 2019-10-29 DIAGNOSIS — Z0000 Encounter for general adult medical examination without abnormal findings: Secondary | ICD-10-CM | POA: Diagnosis not present

## 2019-10-29 DIAGNOSIS — E118 Type 2 diabetes mellitus with unspecified complications: Secondary | ICD-10-CM

## 2019-10-29 DIAGNOSIS — D509 Iron deficiency anemia, unspecified: Secondary | ICD-10-CM

## 2019-10-29 NOTE — Patient Instructions (Signed)
[name] , Thank you for taking time to come for your Medicare Wellness Visit. I appreciate your ongoing commitment to your health goals. Please review the following plan we discussed and let me know if I can assist you in the future.   Screening recommendations/referrals: Colonoscopy: Up to date, completed 12/30/2018, due 12/2028 Recommended yearly ophthalmology/optometry visit for glaucoma screening and checkup Recommended yearly dental visit for hygiene and checkup  Vaccinations: Influenza vaccine: Up to date, completed 02/03/2019, due 11/2019 Pneumococcal vaccine: Completed series Tdap vaccine: Up to date, completed 01/26/2014, due 01/2024 Shingles vaccine: due, check with insurance for coverage   Covid-19: Completed series  Advanced directives: Advance directive discussed with you today. Even though you declined this today please call our office should you change your mind and we can give you the proper paperwork for you to fill out.   Conditions/risks identified: diabetes, hypertension, hypercholesterolemia  Next appointment: Follow up in one year for your annual wellness visit.   Preventive Care 70 Years and Older, Male Preventive care refers to lifestyle choices and visits with your health care provider that can promote health and wellness. What does preventive care include?  A yearly physical exam. This is also called an annual well check.  Dental exams once or twice a year.  Routine eye exams. Ask your health care provider how often you should have your eyes checked.  Personal lifestyle choices, including:  Daily care of your teeth and gums.  Regular physical activity.  Eating a healthy diet.  Avoiding tobacco and drug use.  Limiting alcohol use.  Practicing safe sex.  Taking low doses of aspirin every day.  Taking vitamin and mineral supplements as recommended by your health care provider. What happens during an annual well check? The services and screenings  done by your health care provider during your annual well check will depend on your age, overall health, lifestyle risk factors, and family history of disease. Counseling  Your health care provider may ask you questions about your:  Alcohol use.  Tobacco use.  Drug use.  Emotional well-being.  Home and relationship well-being.  Sexual activity.  Eating habits.  History of falls.  Memory and ability to understand (cognition).  Work and work Statistician. Screening  You may have the following tests or measurements:  Height, weight, and BMI.  Blood pressure.  Lipid and cholesterol levels. These may be checked every 5 years, or more frequently if you are over 37 years old.  Skin check.  Lung cancer screening. You may have this screening every year starting at age 67 if you have a 30-pack-year history of smoking and currently smoke or have quit within the past 15 years.  Fecal occult blood test (FOBT) of the stool. You may have this test every year starting at age 61.  Flexible sigmoidoscopy or colonoscopy. You may have a sigmoidoscopy every 5 years or a colonoscopy every 10 years starting at age 15.  Prostate cancer screening. Recommendations will vary depending on your family history and other risks.  Hepatitis C blood test.  Hepatitis B blood test.  Sexually transmitted disease (STD) testing.  Diabetes screening. This is done by checking your blood sugar (glucose) after you have not eaten for a while (fasting). You may have this done every 1-3 years.  Abdominal aortic aneurysm (AAA) screening. You may need this if you are a current or former smoker.  Osteoporosis. You may be screened starting at age 31 if you are at high risk. Talk with your  health care provider about your test results, treatment options, and if necessary, the need for more tests. Vaccines  Your health care provider may recommend certain vaccines, such as:  Influenza vaccine. This is recommended  every year.  Tetanus, diphtheria, and acellular pertussis (Tdap, Td) vaccine. You may need a Td booster every 10 years.  Zoster vaccine. You may need this after age 40.  Pneumococcal 13-valent conjugate (PCV13) vaccine. One dose is recommended after age 61.  Pneumococcal polysaccharide (PPSV23) vaccine. One dose is recommended after age 14. Talk to your health care provider about which screenings and vaccines you need and how often you need them. This information is not intended to replace advice given to you by your health care provider. Make sure you discuss any questions you have with your health care provider. Document Released: 05/06/2015 Document Revised: 12/28/2015 Document Reviewed: 02/08/2015 Elsevier Interactive Patient Education  2017 [employer] Prevention in the Home Falls can cause injuries. They can happen to people of all ages. There are many things you can do to make your home safe and to help prevent falls. What can I do on the outside of my home?  Regularly fix the edges of walkways and driveways and fix any cracks.  Remove anything that might make you trip as you walk through a door, such as a raised step or threshold.  Trim any bushes or trees on the path to your home.  Use bright outdoor lighting.  Clear any walking paths of anything that might make someone trip, such as rocks or tools.  Regularly check to see if handrails are loose or broken. Make sure that both sides of any steps have handrails.  Any raised decks and porches should have guardrails on the edges.  Have any leaves, snow, or ice cleared regularly.  Use sand or salt on walking paths during winter.  Clean up any spills in your garage right away. This includes oil or grease spills. What can I do in the bathroom?  Use night lights.  Install grab bars by the toilet and in the tub and shower. Do not use towel bars as grab bars.  Use non-skid mats or decals in the tub or shower.  If  you need to sit down in the shower, use a plastic, non-slip stool.  Keep the floor dry. Clean up any water that spills on the floor as soon as it happens.  Remove soap buildup in the tub or shower regularly.  Attach bath mats securely with double-sided non-slip rug tape.  Do not have throw rugs and other things on the floor that can make you trip. What can I do in the bedroom?  Use night lights.  Make sure that you have a light by your bed that is easy to reach.  Do not use any sheets or blankets that are too big for your bed. They should not hang down onto the floor.  Have a firm chair that has side arms. You can use this for support while you get dressed.  Do not have throw rugs and other things on the floor that can make you trip. What can I do in the kitchen?  Clean up any spills right away.  Avoid walking on wet floors.  Keep items that you use a lot in easy-to-reach places.  If you need to reach something above you, use a strong step stool that has a grab bar.  Keep electrical cords out of the way.  Do not use  floor polish or wax that makes floors slippery. If you must use wax, use non-skid floor wax.  Do not have throw rugs and other things on the floor that can make you trip. What can I do with my stairs?  Do not leave any items on the stairs.  Make sure that there are handrails on both sides of the stairs and use them. Fix handrails that are broken or loose. Make sure that handrails are as long as the stairways.  Check any carpeting to make sure that it is firmly attached to the stairs. Fix any carpet that is loose or worn.  Avoid having throw rugs at the top or bottom of the stairs. If you do have throw rugs, attach them to the floor with carpet tape.  Make sure that you have a light switch at the top of the stairs and the bottom of the stairs. If you do not have them, ask someone to add them for you. What else can I do to help prevent falls?  Wear shoes  that:  Do not have high heels.  Have rubber bottoms.  Are comfortable and fit you well.  Are closed at the toe. Do not wear sandals.  If you use a stepladder:  Make sure that it is fully opened. Do not climb a closed stepladder.  Make sure that both sides of the stepladder are locked into place.  Ask someone to hold it for you, if possible.  Clearly mark and make sure that you can see:  Any grab bars or handrails.  First and last steps.  Where the edge of each step is.  Use tools that help you move around (mobility aids) if they are needed. These include:  Canes.  Walkers.  Scooters.  Crutches.  Turn on the lights when you go into a dark area. Replace any light bulbs as soon as they burn out.  Set up your furniture so you have a clear path. Avoid moving your furniture around.  If any of your floors are uneven, fix them.  If there are any pets around you, be aware of where they are.  Review your medicines with your doctor. Some medicines can make you feel dizzy. This can increase your chance of falling. Ask your doctor what other things that you can do to help prevent falls. This information is not intended to replace advice given to you by your health care provider. Make sure you discuss any questions you have with your health care provider. Document Released: 02/03/2009 Document Revised: 09/15/2015 Document Reviewed: 05/14/2014 Elsevier Interactive Patient Education  2017 [employer].

## 2019-10-29 NOTE — Progress Notes (Signed)
PCP notes:  Health Maintenance: No gaps noted    Abnormal Screenings: none   Patient concerns: Unsteady gait- dizziness  Wants something prescribed to check blood sugar without fingersticks    Nurse concerns: none   Next PCP appt.: 11/03/2019 @ 9:30 am

## 2019-10-29 NOTE — Progress Notes (Signed)
Subjective:   [name] is a 69 y.o. male who presents for Medicare Annual/Subsequent preventive examination.  Review of Systems: N/A     I connected with the patient today by telephone and verified that I am speaking with the correct person using two identifiers. Location patient: home Location nurse: work Persons participating in the virtual visit: patient, Marine scientist.   I discussed the limitations, risks, security and privacy concerns of performing an evaluation and management service by telephone and the availability of in person appointments. I also discussed with the patient that there may be a patient responsible charge related to this service. The patient expressed understanding and verbally consented to this telephonic visit.    Interactive audio and video telecommunications were attempted between this nurse and patient, however failed, due to patient having technical difficulties OR patient did not have access to video capability.  We continued and completed visit with audio only.     Cardiac Risk Factors include: advanced age (>68men, >49 women);male gender;diabetes mellitus;hypertension;Other (see comment), Risk factor comments: hypercholesterolemia     Objective:    Today's Vitals   10/29/19 0906  PainSc: 5    There is no height or weight on file to calculate BMI.  Advanced Directives 10/29/2019 07/16/2019 07/03/2019 05/12/2019 02/25/2019 10/16/2018 08/25/2018  Does Patient Have a Medical Advance Directive? No No No No No Yes No  Type of Advance Directive - - - - - Press photographer;Living will -  Does patient want to make changes to medical advance directive? - No - Patient declined - Yes (MAU/Ambulatory/Procedural Areas - Information given) - No - Patient declined No - Patient declined  Copy of [place] in Chart? - - - - - No - copy requested -  Would patient like information on creating a medical advance directive? No - Patient declined No  - Patient declined No - Patient declined Yes (MAU/Ambulatory/Procedural Areas - Information given) No - Patient declined - No - Patient declined  Pre-existing out of facility DNR order (yellow form or pink MOST form) - - - - - - -    Current Medications (verified) Outpatient Encounter Medications as of 10/29/2019  Medication Sig  . aspirin EC 81 MG tablet Take 81 mg by mouth daily.  Marland Kitchen atorvastatin (LIPITOR) 80 MG tablet TAKE 1 TABLET BY MOUTH  DAILY  . baclofen (LIORESAL) 10 MG tablet TAKE 1 TABLET BY MOUTH AT  BEDTIME AS NEEDED FOR  MUSCLE SPASM(S)  . carvedilol (COREG) 3.125 MG tablet Take 3.125 mg by mouth 2 (two) times daily.  . empagliflozin (JARDIANCE) 10 MG TABS tablet Take 10 mg by mouth daily.  Marland Kitchen FLOMAX 0.4 MG CAPS capsule Take 0.4 mg by mouth daily.  . fluticasone (FLONASE) 50 MCG/ACT nasal spray Place 2 sprays into both nostrils daily.  Marland Kitchen gabapentin (NEURONTIN) 100 MG capsule TAKE 1 CAPSULE BY MOUTH TWO TIMES DAILY  . glipiZIDE (GLUCOTROL) 10 MG tablet Take 10 mg by mouth daily.  Marland Kitchen glucose blood (ONETOUCH ULTRA) test strip USE WITH METER TO CHECK  SUGAR TWICE DAILY.  Marland Kitchen LORazepam (ATIVAN) 1 MG tablet Take 1 tablet (1 mg total) by mouth as needed for anxiety (30 minutes prior to MRI and may repeat once, just prior to scan if needed).  . losartan (COZAAR) 50 MG tablet Take 1 tablet (50 mg total) by mouth daily.  . metFORMIN (GLUCOPHAGE) 1000 MG tablet Take 1 tablet (1,000 mg total) by mouth 2 (two) times daily with a  meal.  . omeprazole (PRILOSEC) 40 MG capsule TAKE 1 CAPSULE(40 MG) BY MOUTH DAILY  . oxyCODONE-acetaminophen (PERCOCET) 10-325 MG tablet Take 1 tablet by mouth every 4 (four) hours as needed for pain.  . phenazopyridine (PYRIDIUM) 200 MG tablet Take 1 tablet (200 mg total) by mouth 3 (three) times daily as needed for pain.   No facility-administered encounter medications on file as of 10/29/2019.    Allergies (verified) Glimepiride and Menthol (topical analgesic)    History: Past Medical History:  Diagnosis Date  . Allergic rhinitis   . Anxiety   . Atherosclerosis of aorta ([place])   . CAD (coronary artery disease) cardiologist-- dr Tressia Miners turner   long hx atypical chest pain--- cardiac cath 08-24-2002 in epic showed normal coronaries;   03-17-2007  nuclear stress test showed ?small infarct , ef 60%;  cath done 12-31-2007 for recurrent CP showed nonob cad;   08-27-2018 stress test showed intermediate risk w/ apex ischemia, nuclear ef 61%;   cath done 08-29-2018 showed Nonobstructive 25% plaque in LAD   . Chronic neck pain    narcotic dependant  . DDD (degenerative disc disease), cervical   . Depression   . Diabetic neuropathy ([place])   . Diverticulosis 2013   by CT scan  . Erectile dysfunction 08/01/2015  . Essential hypertension 01/01/2008  . Fatty liver 07/31/2018   By [place] 07/2018,  followed by pcp  . GERD (gastroesophageal reflux disease)   . Hemorrhoids   . History of anemia   . History of cocaine abuse ([place])    per pt quit 05/ 2004  . History of cocaine use 2004  . History of colon polyps   . History of epididymitis   . History of esophageal stricture    s/p  dilatation 2009;  12-30-2018  . History of gastric ulcer    12-30-2018  EGD showed non-bleeding gastric ulcer  . History of prostatitis 08/2011  . History of transient ischemic attack (TIA)    remote hx , related to cocaine abuse which pt quit 2005  . Hyperlipidemia   . Hypertension    Essential  . OSA (obstructive sleep apnea)    study 05-07-2008 in epic , mild ,  pt does not use cpap  . Pain due to onychomycosis of toenails of both feet 10/15/2018  . Prostate cancer [facility]) urologist--- dr herrick/  oncologist--- dr Tammi Klippel   first dx 01/ 2018 w/ Stage T2a and Gleason 3+3 , active survillance;  repear bx 04-21-2019 Gleason 3+4,  scheduled for brachytherapy 07-03-2019  . T2DM (type 2 diabetes mellitus) ([place])    followed by pcp---  (03-04-201  checks blood sugar 3 times daily,   fasting sugar's-- 140-150   Past Surgical History:  Procedure Laterality Date  . ANTERIOR CERVICAL DECOMP/DISCECTOMY FUSION  06-24-2008  and 10-28-2008 @[facility]    06-25-2008  C7 -- T1;    10-28-2008  C5 -- C7  . CARDIAC CATHETERIZATION  08-24-2002   dr gamble  @[facility]    normal coronaries  . CARDIAC CATHETERIZATION  12-31-2007    dr Johnsie Cancel   nonobstructive CAD  . CARPAL TUNNEL RELEASE Right 2000  approx.  Marland Kitchen CATARACT EXTRACTION W/ INTRAOCULAR LENS  IMPLANT, BILATERAL  2016  . COLONOSCOPY  10/11/2011   severe diverticulosis, int hemorrhoids, rec rpt 5 yrs Carlean Purl)  . COLONOSCOPY  12/2018   benign rectal polyp Carlean Purl)  . DIRECT LARYNGOSCOPY  11/02/2008   @[facility]    Extubation under anesthesia post op complication from ACDF  . ESOPHAGOGASTRODUODENOSCOPY  10/11/2011   WNL Carlean Purl)  . ESOPHAGOGASTRODUODENOSCOPY  09/11-2018   gastric ulcer, gastritis, dilated esophageal stenosis, limit NSAIDs continue PPI, no f/u planned Carlean Purl)  . EXCISION MASS NECK N/A 08/29/2016   Procedure: EXCISION MASS POSTERIOR NECK;  Surgeon: Erroll Luna, MD;  Location: [place];  Service: General;  Laterality: N/A;  . HEMATOMA EVACUATION  10/29/2008   @[facility]    post op ACDF 10-28-2008  . LAPAROSCOPIC INGUINAL HERNIA REPAIR Bilateral 11-22-2006   @[facility]    AND EXCISION RLQ ABDOMINAL LIPOMA  . LEFT HEART CATH AND CORONARY ANGIOGRAPHY N/A 08/29/2018   Procedure: LEFT HEART CATH AND CORONARY ANGIOGRAPHY;  Surgeon: Jettie Booze, MD;  Location: [place] [facility];  Service: Cardiovascular;  Laterality: N/A;  . RADIOACTIVE SEED IMPLANT N/A 07/03/2019   Procedure: RADIOACTIVE SEED IMPLANT/BRACHYTHERAPY IMPLANT;  Surgeon: Ardis Hughs, MD;  Location[facility];  Service: Urology;  Laterality: N/A;  . SHOULDER ARTHROSCOPY WITH SUBACROMIAL DECOMPRESSION AND OPEN ROTATOR C Left 01/23/2013   Procedure: LEFT SHOULDER ARTHROSCOPY WITH SUBACROMIAL DECOMPRESSION AND MINI OPEN ROTATOR CUFF REPAIR, AND  OPEN DISTAL CLAVICLE RESECTION;  Surgeon: Augustin Schooling, MD;  Location: [place];  Service: Orthopedics;  Laterality: Left;  . SHOULDER SURGERY Right 05/2011  . SPACE OAR INSTILLATION N/A 07/03/2019   Procedure: SPACE OAR INSTILLATION;  Surgeon: Ardis Hughs, MD;  Location[facility];  Service: Urology;  Laterality: N/A;  . TONSILLECTOMY  child  . TRIGGER FINGER RELEASE Right 12-12-2005   @[facility]    Right long finger   Family History  Problem Relation Age of Onset  . Diabetes Mother   . Hypertension Mother   . Hyperlipidemia Mother   . Diabetes Father   . Diabetes Sister   . Hypertension Sister   . Hypertension Brother   . Diabetes Brother   . Diabetes Brother   . Hypertension Brother   . Diabetes Brother   . Heart disease Maternal Uncle        Heart failure  . Cancer Paternal Uncle        Prostate  . Cancer Paternal Grandfather        Prostate  . Stroke Neg Hx   . Esophageal cancer Neg Hx   . Rectal cancer Neg Hx   . Stomach cancer Neg Hx   . Breast cancer Neg Hx   . Colon cancer Neg Hx    Social History   Socioeconomic History  . Marital status: Single    Spouse name: Not on file  . Number of children: 1  . Years of education: Not on file  . Highest education level: Not on file  Occupational History  . Occupation: [name] previously    Fish farm manager: UNEMPLOYED    Comment: Now on disability after neck surgery  Tobacco Use  . Smoking status: Never Smoker  . Smokeless tobacco: Never Used  Vaping Use  . Vaping Use: Never used  Substance and Sexual Activity  . Alcohol use: No    Alcohol/week: 0.0 standard drinks  . Drug use: Not Currently    Comment: H/O marijuana (last 2005); Cocaine 5/04  . Sexual activity: Not on file  Other Topics Concern  . 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   Activity: no regular exercise, wants to start walking  Diet: good water, fruits/vegetables daily.      Retired   1 daughter and 1 granddaughter   Social Determinants of Radio broadcast assistant Strain: [place]   . Difficulty of Paying Living Expenses: Not hard at all  Food Insecurity: No Food Insecurity  . Worried About Charity fundraiser in the Last Year: Never true  . Ran Out of Food in the Last Year: Never true  Transportation Needs: No Transportation Needs  . Lack of Transportation (Medical): No  . Lack of Transportation (Non-Medical): No  Physical Activity: Inactive  . Days of Exercise per Week: 0 days  . Minutes of Exercise per Session: 0 min  Stress: No Stress Concern Present  . Feeling of Stress : Not at all  Social Connections:   . Frequency of Communication with Friends and Family:   . Frequency of Social Gatherings with Friends and Family:   . Attends Religious Services:   . Active Member of Clubs or Organizations:   . Attends Archivist Meetings:   Marland Kitchen Marital Status:     Tobacco Counseling Counseling given: Not Answered   Clinical Intake:  Pre-visit preparation completed: Yes  Pain : 0-10 Pain Score: 5  Pain Type: Chronic pain Pain Location: Abdomen (left side) Pain Orientation: Left Pain Descriptors / Indicators: Aching Pain Onset: More than a month ago Pain Frequency: Intermittent     Nutritional Risks: None Diabetes: Yes CBG done?: No Did pt. bring in CBG monitor from home?: No  How often do you need to have someone help you when you read instructions, pamphlets, or other written materials from your doctor or pharmacy?: 1 - Never What is the last grade level you completed in school?: 12th  Diabetic: Yes Nutrition Risk Assessment:  Has the patient had any N/V/D within the last 2 months?  No  Does the patient have any non-healing wounds?  No  Has the patient had any unintentional weight loss or  weight gain?  No   Diabetes:  Is the patient diabetic?  Yes  If diabetic, was a CBG obtained today?  No  Did the patient bring in their glucometer from home?  No  How often do you monitor your CBG's? 3-4 times daily.   Financial Strains and Diabetes Management:  Are you having any financial strains with the device, your supplies or your medication? No .  Does the patient want to be seen by Chronic Care Management for management of their diabetes?  No  Would the patient like to be referred to a Nutritionist or for Diabetic Management?  No   Diabetic Exams:  Diabetic Eye Exam: Completed 05/01/2019 Diabetic Foot Exam: Completed 04/30/2019   Interpreter Needed?: No  Information entered by :: CJohnson, LPN   Activities of Daily Living In your present state of health, do you have any difficulty performing the following activities: 10/29/2019 07/03/2019  Hearing? N N  Vision? N N  Difficulty concentrating or making decisions? Y -  Comment forgets sometimes -  Walking or climbing stairs? N N  Dressing or bathing? N N  Doing errands, shopping? N -  Preparing Food and eating ? N -  Using the Toilet? N -  In the past six months, have you accidently leaked urine? N -  Do you have problems with loss of bowel control? N -  Managing your Medications? N -  Managing your Finances? N -  Housekeeping or managing your Housekeeping? N -  Some recent data might  be hidden    Patient Care Team: Ria Bush, MD as PCP - General (Family Medicine) Sueanne Margarita, MD as PCP - Cardiology (Cardiology) Tonia Ghent, MD (Family Medicine) Shirley Muscat Loreen Freud, MD as Referring Physician (Optometry) Netta Cedars, MD as Consulting Physician (Orthopedic Surgery) Melina Schools, MD as Consulting Physician (Orthopedic Surgery) Ardis Hughs, MD as Consulting Physician (Urology) Tyler Pita, MD as Consulting Physician (Radiation Oncology) Cira Rue, RN Nurse Navigator as Registered  Nurse (Medical Oncology)  Indicate any recent [place] you may have received from other than [facility] providers in the past year (date may be approximate).     Assessment:   This is a routine wellness examination for Kirsten.  Hearing/Vision screen  Hearing Screening   125Hz  250Hz  500Hz  1000Hz  2000Hz  3000Hz  4000Hz  6000Hz  8000Hz   Right ear:           Left ear:           Vision Screening Comments: Patient gets annual eye exams   Dietary issues and exercise activities discussed: Current Exercise Habits: The patient does not participate in regular exercise at present, Exercise limited by: None identified  Goals    . Increase water intake     Starting 10/16/2018, I will continue to drink at least 6-8 glasses of water daily.     . Patient Stated     10/29/2019, I will maintain and continue medications as prescribed.      Depression Screen PHQ 2/9 Scores 10/29/2019 10/16/2018 08/13/2018 03/04/2017 02/02/2016 01/31/2015 01/26/2014  PHQ - 2 Score 0 0 0 0 0 0 0  PHQ- 9 Score 0 0 - 0 - - -    Fall Risk Fall Risk  10/29/2019 10/16/2018 03/04/2017 02/02/2016 01/31/2015  Falls in the past year? 0 0 No No No  Number falls in past yr: 0 - - - -  Injury with Fall? 0 - - - -  Risk for fall due to : Impaired balance/gait;Medication side effect - - - -  Follow up Falls evaluation completed;Falls prevention discussed - - - -    Any stairs in or around the home? Yes  If so, are there any without handrails? No  Home free of loose throw rugs in walkways, pet beds, electrical cords, etc? Yes  Adequate lighting in your home to reduce risk of falls? Yes   ASSISTIVE DEVICES UTILIZED TO PREVENT FALLS:  Life alert? No  Use of a cane, walker or w/c? No  Grab bars in the bathroom? No  Shower chair or bench in shower? No  Elevated toilet seat or a handicapped toilet? No   TIMED UP AND GO:  Was the test performed? N/A, telephonic visit .    Cognitive Function: MMSE - Mini Mental State Exam  10/29/2019 10/16/2018 03/04/2017 02/02/2016  Not completed: Refused - - -  Orientation to time - 5 5 5   Orientation to Place - 5 5 5   Registration - 3 3 3   Attention/ Calculation - 0 0 0  Recall - 3 3 3   Language- name 2 objects - 0 0 0  Language- repeat - 1 1 1   Language- follow 3 step command - 0 3 3  Language- read & follow direction - 0 0 0  Write a sentence - 0 0 0  Copy design - 0 0 0  Total score - 17 20 20   Mini Cog  Mini-Cog screen was not completed. Patient refused. Maximum score is 22. A value of 0 denotes this part  of the MMSE was not completed or the patient failed this part of the Mini-Cog screening.       Immunizations Immunization History  Administered Date(s) Administered  . Fluad Quad(high Dose 65+) 02/03/2019  . Influenza Split 02/06/2011, 01/08/2012  . Influenza Whole 03/19/2007, 02/19/2008, 01/27/2009, 01/25/2010  . Influenza, High Dose Seasonal PF 03/05/2018  . Influenza,inj,Quad PF,6+ Mos 02/20/2013, 01/26/2014, 01/31/2015, 02/02/2016, 03/04/2017  . Influenza,inj,quad, With Preservative 01/22/2019  . PFIZER SARS-COV-2 Vaccination 08/22/2019, 09/12/2019  . Pneumococcal Conjugate-13 08/01/2015  . Pneumococcal Polysaccharide-23 06/16/2012, 04/30/2019  . Td 04/24/2003  . Tdap 01/26/2014  . Zoster 12/15/2010    TDAP status: Up to date Flu Vaccine status: Up to date Pneumococcal vaccine status: Up to date Covid-19 vaccine status: Completed vaccines  Qualifies for Shingles Vaccine? Yes   Zostavax completed Yes   Shingrix Completed?: No.    Education has been provided regarding the importance of this vaccine. Patient has been advised to call insurance company to determine out of pocket expense if they have not yet received this vaccine. Advised may also receive vaccine at local pharmacy or Health Dept. Verbalized acceptance and understanding.  Screening Tests Health Maintenance  Topic Date Due  . DTAP VACCINES (1) 04/16/1951  . HEMOGLOBIN A1C  09/24/2019   . INFLUENZA VACCINE  11/22/2019  . FOOT EXAM  04/29/2020  . OPHTHALMOLOGY EXAM  04/30/2020  . DTaP/Tdap/Td (3 - Td or Tdap) 01/27/2024  . TETANUS/TDAP  01/27/2024  . COLONOSCOPY  12/29/2028  . COVID-19 Vaccine  Completed  . Hepatitis C Screening  Completed  . PNA vac Low Risk Adult  Completed    Health Maintenance  Health Maintenance Due  Topic Date Due  . DTAP VACCINES (1) 04/16/1951  . HEMOGLOBIN A1C  09/24/2019    Colorectal cancer screening: Completed 12/30/2018. Repeat every 10 years  Lung Cancer Screening: (Low Dose CT Chest recommended if Age 34-80 years, 30 pack-year currently smoking OR have quit w/in 15 years.) does not qualify.    Additional Screening:  Hepatitis C Screening: does qualify; Completed 08/01/2015  Vision Screening: Recommended annual ophthalmology exams for early detection of glaucoma and other disorders of the eye. Is the patient up to date with their annual eye exam?  Yes  Who is the provider or what is the name of the office in which the patient attends annual eye exams? [facility] If pt is not established with a provider, would they like to be referred to a provider to establish care? No .   Dental Screening: Recommended annual dental exams for proper oral hygiene  Community Resource Referral / Chronic Care Management: CRR required this visit?  No   CCM required this visit?  No      Plan:     I have personally reviewed and noted the following in the patient's chart:   . Medical and social history . Use of alcohol, tobacco or illicit drugs  . Current medications and supplements . Functional ability and status . Nutritional status . Physical activity . Advanced directives . List of other physicians . Hospitalizations, surgeries, and ER visits in previous 12 months . Vitals . Screenings to include cognitive, depression, and falls . Referrals and appointments  In addition, I have reviewed and discussed with patient  certain preventive protocols, quality metrics, and best practice recommendations. A written personalized care plan for preventive services as well as general preventive health recommendations were provided to patient.   Due to this being a telephonic visit, the after visit summary  with patients personalized plan was offered to patient via mail or my-chart. Patient preferred to pick up at office at next visit.   Andrez Grime, LPN   05/01/3788

## 2019-10-30 ENCOUNTER — Telehealth: Payer: Self-pay

## 2019-10-30 ENCOUNTER — Other Ambulatory Visit (INDEPENDENT_AMBULATORY_CARE_PROVIDER_SITE_OTHER): Payer: Medicare Other

## 2019-10-30 ENCOUNTER — Other Ambulatory Visit: Payer: Self-pay

## 2019-10-30 DIAGNOSIS — E118 Type 2 diabetes mellitus with unspecified complications: Secondary | ICD-10-CM | POA: Diagnosis not present

## 2019-10-30 DIAGNOSIS — E7800 Pure hypercholesterolemia, unspecified: Secondary | ICD-10-CM

## 2019-10-30 DIAGNOSIS — C61 Malignant neoplasm of prostate: Secondary | ICD-10-CM

## 2019-10-30 DIAGNOSIS — D509 Iron deficiency anemia, unspecified: Secondary | ICD-10-CM | POA: Diagnosis not present

## 2019-10-30 LAB — COMPREHENSIVE METABOLIC PANEL
ALT: 21 U/L (ref 0–53)
AST: 12 U/L (ref 0–37)
Albumin: 4.3 g/dL (ref 3.5–5.2)
Alkaline Phosphatase: 52 U/L (ref 39–117)
BUN: 12 mg/dL (ref 6–23)
CO2: 29 mEq/L (ref 19–32)
Calcium: 9.5 mg/dL (ref 8.4–10.5)
Chloride: 100 mEq/L (ref 96–112)
Creatinine, Ser: 0.93 mg/dL (ref 0.40–1.50)
GFR: 97.28 mL/min (ref >60.00)
Glucose, Bld: 173 mg/dL — ABNORMAL HIGH (ref 70–99)
Potassium: 4.1 mEq/L (ref 3.5–5.1)
Sodium: 140 mEq/L (ref 135–145)
Total Bilirubin: 0.4 mg/dL (ref 0.2–1.2)
Total Protein: 7.1 g/dL (ref 6.0–8.3)

## 2019-10-30 LAB — CBC WITH DIFFERENTIAL/PLATELET
Basophils Absolute: 0 10*3/uL (ref 0.0–0.1)
Basophils Relative: 0.7 % (ref 0.0–3.0)
Eosinophils Absolute: 0.1 10*3/uL (ref 0.0–0.7)
Eosinophils Relative: 2.1 % (ref 0.0–5.0)
HCT: 40.5 % (ref 39.0–52.0)
Hemoglobin: 13.6 g/dL (ref 13.0–17.0)
Lymphocytes Relative: 20.8 % (ref 12.0–46.0)
Lymphs Abs: 1 10*3/uL (ref 0.7–4.0)
MCHC: 33.6 g/dL (ref 30.0–36.0)
MCV: 91.5 fl (ref 78.0–100.0)
Monocytes Absolute: 0.6 10*3/uL (ref 0.1–1.0)
Monocytes Relative: 12.6 % — ABNORMAL HIGH (ref 3.0–12.0)
Neutro Abs: 3 10*3/uL (ref 1.4–7.7)
Neutrophils Relative %: 63.8 % (ref 43.0–77.0)
Platelets: 265 10*3/uL (ref 150.0–400.0)
RBC: 4.43 Mil/uL (ref 4.22–5.81)
RDW: 15.1 % (ref 11.5–15.5)
WBC: 4.7 10*3/uL (ref 4.0–10.5)

## 2019-10-30 LAB — LIPID PANEL
Cholesterol: 105 mg/dL (ref 0–200)
HDL: 30.7 mg/dL — ABNORMAL LOW (ref >39.00)
LDL Cholesterol: 61 mg/dL (ref 0–99)
NonHDL: 74.33
Total CHOL/HDL Ratio: 3
Triglycerides: 67 mg/dL (ref 0.0–149.0)
VLDL: 13.4 mg/dL (ref 0.0–40.0)

## 2019-10-30 LAB — HEMOGLOBIN A1C: Hgb A1c MFr Bld: 7.3 % — ABNORMAL HIGH (ref 4.6–6.5)

## 2019-10-30 LAB — TSH: TSH: 1.6 u[IU]/mL (ref 0.35–4.50)

## 2019-10-30 LAB — PSA: PSA: 0.79 ng/mL (ref 0.10–4.00)

## 2019-10-30 NOTE — Telephone Encounter (Signed)
Pt was in office for labs today and provided copy (to keep) of recent BS readings.    Placed in Dr. Synthia Innocent box.

## 2019-11-01 ENCOUNTER — Other Ambulatory Visit: Payer: Self-pay | Admitting: Family Medicine

## 2019-11-03 ENCOUNTER — Other Ambulatory Visit: Payer: Self-pay

## 2019-11-03 ENCOUNTER — Encounter: Payer: Self-pay | Admitting: Family Medicine

## 2019-11-03 ENCOUNTER — Ambulatory Visit (INDEPENDENT_AMBULATORY_CARE_PROVIDER_SITE_OTHER): Payer: Medicare Other | Admitting: Family Medicine

## 2019-11-03 VITALS — BP 140/72 | HR 78 | Temp 97.8°F | Ht 67.75 in | Wt 239.1 lb

## 2019-11-03 DIAGNOSIS — K259 Gastric ulcer, unspecified as acute or chronic, without hemorrhage or perforation: Secondary | ICD-10-CM

## 2019-11-03 DIAGNOSIS — G459 Transient cerebral ischemic attack, unspecified: Secondary | ICD-10-CM

## 2019-11-03 DIAGNOSIS — Z7189 Other specified counseling: Secondary | ICD-10-CM

## 2019-11-03 DIAGNOSIS — D509 Iron deficiency anemia, unspecified: Secondary | ICD-10-CM

## 2019-11-03 DIAGNOSIS — I2510 Atherosclerotic heart disease of native coronary artery without angina pectoris: Secondary | ICD-10-CM

## 2019-11-03 DIAGNOSIS — I77810 Thoracic aortic ectasia: Secondary | ICD-10-CM

## 2019-11-03 DIAGNOSIS — T39395A Adverse effect of other nonsteroidal anti-inflammatory drugs [NSAID], initial encounter: Secondary | ICD-10-CM

## 2019-11-03 DIAGNOSIS — K219 Gastro-esophageal reflux disease without esophagitis: Secondary | ICD-10-CM

## 2019-11-03 DIAGNOSIS — R42 Dizziness and giddiness: Secondary | ICD-10-CM

## 2019-11-03 DIAGNOSIS — E1142 Type 2 diabetes mellitus with diabetic polyneuropathy: Secondary | ICD-10-CM

## 2019-11-03 DIAGNOSIS — Z0000 Encounter for general adult medical examination without abnormal findings: Secondary | ICD-10-CM | POA: Diagnosis not present

## 2019-11-03 DIAGNOSIS — I10 Essential (primary) hypertension: Secondary | ICD-10-CM

## 2019-11-03 DIAGNOSIS — E1169 Type 2 diabetes mellitus with other specified complication: Secondary | ICD-10-CM

## 2019-11-03 DIAGNOSIS — E118 Type 2 diabetes mellitus with unspecified complications: Secondary | ICD-10-CM | POA: Diagnosis not present

## 2019-11-03 DIAGNOSIS — C61 Malignant neoplasm of prostate: Secondary | ICD-10-CM

## 2019-11-03 DIAGNOSIS — E785 Hyperlipidemia, unspecified: Secondary | ICD-10-CM

## 2019-11-03 DIAGNOSIS — F341 Dysthymic disorder: Secondary | ICD-10-CM

## 2019-11-03 NOTE — Assessment & Plan Note (Signed)
H/o this. Continue aspirin, statin.

## 2019-11-03 NOTE — Assessment & Plan Note (Signed)
Continue to encourage healthy diet and lifestyle changes to affect sustainable weight loss.  

## 2019-11-03 NOTE — Assessment & Plan Note (Signed)
Continues gabapentin 100mg  BID

## 2019-11-03 NOTE — Assessment & Plan Note (Addendum)
Stable period off regular medication.  
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
## 2019-11-03 NOTE — Assessment & Plan Note (Signed)
Appreciate uro care Louis Meckel) s/p radioactive seed implants.

## 2019-11-03 NOTE — Assessment & Plan Note (Signed)
Noted on echocardiogram 02/2019 - rec rpt 1 yr

## 2019-11-03 NOTE — Assessment & Plan Note (Addendum)
Chronic. A1c slightly above goal today - encouraged renewed efforts to follow low sugar low carb diabetic diet. No med changes today. No recent hypoglycemia. Denies UTI or yeast infection symptoms.

## 2019-11-03 NOTE — Assessment & Plan Note (Addendum)
Advanced directive - daughter is HCPOA. Wants to be cremated. Does not have advanced directive.Has discussed his desires with daughter, [name].

## 2019-11-03 NOTE — Assessment & Plan Note (Signed)
Continue high dose atorvastatin 80mg  daily.  The ASCVD Risk score Mikey Bussing DC Jr., et al., 2013) failed to calculate for the following reasons:   The valid total cholesterol range is 130 to 320 mg/dL

## 2019-11-03 NOTE — Assessment & Plan Note (Signed)
Describes intermittent orthostatic dizziness, orthostatic vital signs normal in office. He feels he stays well hydrated. Discussed possible contribution of flomax to these symptoms (which he uses for LUTS).

## 2019-11-03 NOTE — Assessment & Plan Note (Signed)
Preventative protocols reviewed and updated unless pt declined. Discussed healthy diet and lifestyle.  

## 2019-11-03 NOTE — Assessment & Plan Note (Signed)
Continue aspirin, statin.  

## 2019-11-03 NOTE — Patient Instructions (Addendum)
If interested, check with pharmacy about new 2 shot shingles series (shingrix).  Ensure good hydration status.  Return as needed or in 6 months for diabetes follow up visit.   Health Maintenance After Age 70 After age 68, you are at a higher risk for certain long-term diseases and infections as well as injuries from falls. Falls are a major cause of broken bones and head injuries in people who are older than age 34. Getting regular preventive care can help to keep you healthy and well. Preventive care includes getting regular testing and making lifestyle changes as recommended by your health care provider. Talk with your health care provider about:  Which screenings and tests you should have. A screening is a test that checks for a disease when you have no symptoms.  A diet and exercise plan that is right for you. What should I know about screenings and tests to prevent falls? Screening and testing are the best ways to find a health problem early. Early diagnosis and treatment give you the best chance of managing medical conditions that are common after age 5. Certain conditions and lifestyle choices may make you more likely to have a fall. Your health care provider may recommend:  Regular vision checks. Poor vision and conditions such as cataracts can make you more likely to have a fall. If you wear glasses, make sure to get your prescription updated if your vision changes.  Medicine review. Work with your health care provider to regularly review all of the medicines you are taking, including over-the-counter medicines. Ask your health care provider about any side effects that may make you more likely to have a fall. Tell your health care provider if any medicines that you take make you feel dizzy or sleepy.  Osteoporosis screening. Osteoporosis is a condition that causes the bones to get weaker. This can make the bones weak and cause them to break more easily.  Blood pressure screening. Blood  pressure changes and medicines to control blood pressure can make you feel dizzy.  Strength and balance checks. Your health care provider may recommend certain tests to check your strength and balance while standing, walking, or changing positions.  Foot health exam. Foot pain and numbness, as well as not wearing proper footwear, can make you more likely to have a fall.  Depression screening. You may be more likely to have a fall if you have a fear of falling, feel emotionally low, or feel unable to do activities that you used to do.  Alcohol use screening. Using too much alcohol can affect your balance and may make you more likely to have a fall. What actions can I take to lower my risk of falls? General instructions  Talk with your health care provider about your risks for falling. Tell your health care provider if: ? You fall. Be sure to tell your health care provider about all falls, even ones that seem minor. ? You feel dizzy, sleepy, or off-balance.  Take over-the-counter and prescription medicines only as told by your health care provider. These include any supplements.  Eat a healthy diet and maintain a healthy weight. A healthy diet includes low-fat dairy products, low-fat (lean) meats, and fiber from whole grains, beans, and lots of fruits and vegetables. Home safety  Remove any tripping hazards, such as rugs, cords, and clutter.  Install safety equipment such as grab bars in bathrooms and safety rails on stairs.  Keep rooms and walkways well-lit. Activity   Follow a regular  exercise program to stay fit. This will help you maintain your balance. Ask your health care provider what types of exercise are appropriate for you.  If you need a cane or walker, use it as recommended by your health care provider.  Wear supportive shoes that have nonskid soles. Lifestyle  Do not drink alcohol if your health care provider tells you not to drink.  If you drink alcohol, limit how  much you have: ? 0-1 drink a day for women. ? 0-2 drinks a day for men.  Be aware of how much alcohol is in your drink. In the [place] one drink equals one typical bottle of beer (12 oz), one-half glass of wine (5 oz), or one shot of hard liquor (1 oz).  Do not use any products that contain nicotine or tobacco, such as cigarettes and e-cigarettes. If you need help quitting, ask your health care provider. Summary  Having a healthy lifestyle and getting preventive care can help to protect your health and wellness after age 23.  Screening and testing are the best way to find a health problem early and help you avoid having a fall. Early diagnosis and treatment give you the best chance for managing medical conditions that are more common for people who are older than age 46.  Falls are a major cause of broken bones and head injuries in people who are older than age 88. Take precautions to prevent a fall at home.  Work with your health care provider to learn what changes you can make to improve your health and wellness and to prevent falls. This information is not intended to replace advice given to you by your health care provider. Make sure you discuss any questions you have with your health care provider. Document Revised: 07/31/2018 Document Reviewed: 02/20/2017 Elsevier Patient Education  2020 [employer].

## 2019-11-03 NOTE — Assessment & Plan Note (Signed)
This has resolved.

## 2019-11-03 NOTE — Assessment & Plan Note (Signed)
Managed with daily PPI  

## 2019-11-03 NOTE — Assessment & Plan Note (Signed)
Now off NSAIDs, continues omeprazole 40mg  daily.

## 2019-11-03 NOTE — Assessment & Plan Note (Signed)
Chronic, stable. Continue current regimen. 

## 2019-11-30 ENCOUNTER — Other Ambulatory Visit: Payer: Self-pay | Admitting: Family Medicine

## 2019-11-30 NOTE — Telephone Encounter (Signed)
Baclofen Last filled:  09/08/19, #90 Last OV:  11/03/19, AWV prt 2 Next OV:  05/10/20, 6 mo f/u

## 2019-12-01 ENCOUNTER — Encounter: Payer: Self-pay | Admitting: Podiatry

## 2019-12-01 ENCOUNTER — Other Ambulatory Visit: Payer: Self-pay

## 2019-12-01 ENCOUNTER — Ambulatory Visit: Payer: Medicare Other | Admitting: Podiatry

## 2019-12-01 DIAGNOSIS — B351 Tinea unguium: Secondary | ICD-10-CM

## 2019-12-01 DIAGNOSIS — M79675 Pain in left toe(s): Secondary | ICD-10-CM | POA: Diagnosis not present

## 2019-12-01 DIAGNOSIS — M79674 Pain in right toe(s): Secondary | ICD-10-CM | POA: Diagnosis not present

## 2019-12-01 DIAGNOSIS — E1142 Type 2 diabetes mellitus with diabetic polyneuropathy: Secondary | ICD-10-CM | POA: Diagnosis not present

## 2019-12-01 NOTE — Progress Notes (Signed)
Complaint:  Visit Type: Patient returns to my office for continued preventative foot care services. Complaint: Patient states" my nails have grown long and thick and become painful to walk and wear shoes" Patient has been diagnosed with DM with foot neuropathy. The patient presents for preventative foot care services. No changes to ROS  Podiatric Exam: Vascular: dorsalis pedis and posterior tibial pulses are palpable bilateral. Capillary return is immediate. Temperature gradient is WNL. Skin turgor WNL  Sensorium: Normal Semmes Weinstein monofilament test. Normal tactile sensation bilaterally. Nail Exam: Pt has thick disfigured discolored nails with subungual debris noted bilateral entire nail hallux through fifth toenails Ulcer Exam: There is no evidence of ulcer or pre-ulcerative changes or infection. Orthopedic Exam: Muscle tone and strength are WNL. No limitations in general ROM. No crepitus or effusions noted. Foot type and digits show no abnormalities. Bony prominences are unremarkable. Skin: No Porokeratosis. No infection or ulcers  Diagnosis:  Onychomycosis, , Pain in right toe, pain in left toes  Treatment & Plan Procedures and Treatment: Consent by patient was obtained for treatment procedures.   Debridement of mycotic and hypertrophic toenails, 1 through 5 bilateral and clearing of subungual debris. No ulceration, no infection noted.  Return Visit-Office Procedure: Patient instructed to return to the office for a follow up visit 4 months   for continued evaluation and treatment.    Gardiner Barefoot DPM

## 2019-12-13 ENCOUNTER — Emergency Department (HOSPITAL_COMMUNITY)
Admission: EM | Admit: 2019-12-13 | Discharge: 2019-12-13 | Disposition: A | Discharge status: Home / Self Care | Payer: Medicare Other | Attending: Emergency Medicine | Admitting: Emergency Medicine

## 2019-12-13 ENCOUNTER — Other Ambulatory Visit: Payer: Self-pay

## 2019-12-13 ENCOUNTER — Emergency Department (HOSPITAL_COMMUNITY): Payer: Medicare Other

## 2019-12-13 ENCOUNTER — Encounter (HOSPITAL_COMMUNITY): Payer: Self-pay | Admitting: Emergency Medicine

## 2019-12-13 DIAGNOSIS — R079 Chest pain, unspecified: Secondary | ICD-10-CM | POA: Diagnosis not present

## 2019-12-13 DIAGNOSIS — R05 Cough: Secondary | ICD-10-CM | POA: Insufficient documentation

## 2019-12-13 DIAGNOSIS — Z79899 Other long term (current) drug therapy: Secondary | ICD-10-CM | POA: Diagnosis not present

## 2019-12-13 DIAGNOSIS — I2510 Atherosclerotic heart disease of native coronary artery without angina pectoris: Secondary | ICD-10-CM | POA: Diagnosis not present

## 2019-12-13 DIAGNOSIS — Z7984 Long term (current) use of oral hypoglycemic drugs: Secondary | ICD-10-CM | POA: Insufficient documentation

## 2019-12-13 DIAGNOSIS — R059 Cough, unspecified: Secondary | ICD-10-CM

## 2019-12-13 DIAGNOSIS — Z7982 Long term (current) use of aspirin: Secondary | ICD-10-CM | POA: Insufficient documentation

## 2019-12-13 DIAGNOSIS — R0789 Other chest pain: Secondary | ICD-10-CM | POA: Insufficient documentation

## 2019-12-13 DIAGNOSIS — E119 Type 2 diabetes mellitus without complications: Secondary | ICD-10-CM | POA: Diagnosis not present

## 2019-12-13 DIAGNOSIS — Z20822 Contact with and (suspected) exposure to covid-19: Secondary | ICD-10-CM | POA: Diagnosis not present

## 2019-12-13 DIAGNOSIS — I10 Essential (primary) hypertension: Secondary | ICD-10-CM | POA: Diagnosis not present

## 2019-12-13 LAB — CBC WITH DIFFERENTIAL/PLATELET
Abs Immature Granulocytes: 0.03 10*3/uL (ref 0.00–0.07)
Basophils Absolute: 0 10*3/uL (ref 0.0–0.1)
Basophils Relative: 0 %
Eosinophils Absolute: 0.1 10*3/uL (ref 0.0–0.5)
Eosinophils Relative: 2 %
HCT: 41 % (ref 39.0–52.0)
Hemoglobin: 13.6 g/dL (ref 13.0–17.0)
Immature Granulocytes: 1 %
Lymphocytes Relative: 21 %
Lymphs Abs: 1.1 10*3/uL (ref 0.7–4.0)
MCH: 30 pg (ref 26.0–34.0)
MCHC: 33.2 g/dL (ref 30.0–36.0)
MCV: 90.5 fL (ref 80.0–100.0)
Monocytes Absolute: 0.4 10*3/uL (ref 0.1–1.0)
Monocytes Relative: 8 %
Neutro Abs: 3.7 10*3/uL (ref 1.7–7.7)
Neutrophils Relative %: 68 %
Platelets: 261 10*3/uL (ref 150–400)
RBC: 4.53 MIL/uL (ref 4.22–5.81)
RDW: 14.9 % (ref 11.5–15.5)
WBC: 5.4 10*3/uL (ref 4.0–10.5)
nRBC: 0 % (ref 0.0–0.2)

## 2019-12-13 LAB — BASIC METABOLIC PANEL
Anion gap: 13 (ref 5–15)
BUN: 13 mg/dL (ref 8–23)
CO2: 22 mmol/L (ref 22–32)
Calcium: 8.9 mg/dL (ref 8.9–10.3)
Chloride: 104 mmol/L (ref 98–111)
Creatinine, Ser: 0.93 mg/dL (ref 0.61–1.24)
GFR calc Af Amer: >60 mL/min (ref >60)
GFR calc non Af Amer: >60 mL/min (ref >60)
Glucose, Bld: 149 mg/dL — ABNORMAL HIGH (ref 70–99)
Potassium: 3.8 mmol/L (ref 3.5–5.1)
Sodium: 139 mmol/L (ref 135–145)

## 2019-12-13 LAB — TROPONIN I (HIGH SENSITIVITY): Troponin I (High Sensitivity): 5 ng/L (ref <18)

## 2019-12-13 LAB — SARS CORONAVIRUS 2 BY RT PCR (HOSPITAL ORDER, PERFORMED IN ~~LOC~~ HOSPITAL LAB): SARS Coronavirus 2: NEGATIVE

## 2019-12-13 MED ORDER — BENZONATATE 100 MG PO CAPS: 100.0000 mg | ORAL_CAPSULE | Freq: Three times a day (TID) | ORAL | 0 refills | Status: AC

## 2019-12-13 NOTE — ED Provider Notes (Signed)
[facility] DEPT Provider Note   CSN: [id number] Arrival date & time: 12/13/19  1115     History Chief Complaint  Patient presents with  . Covid Exposure  . Dizziness  . multiple complaints    [name] is a 69 y.o. male history CAD, atherosclerosis of aorta, DVT, diabetes, hypertension, GERD, hyperlipidemia, OSA.  Patient presents today for concern of COVID-19 infection.  He reports that he got 2 COVID-19 vaccines and May of this year he does not recall the name of them.  He reports that over the past month he is exposed to two different people who are positive for Covid.  He reports that he was driving one of them around multiple times a week and that person did not tell the patient that had Covid until today.  Patient reports that he has had a cough over the last 4 weeks, mild intermittent occasionally with dark sputum.  He reports a right-sided chest pain with his cough he describes as a tightness occasionally radiates up his chest to his head, none since 2 days ago.  He reports the pain was mild no clear alleviating factors.  Patient reports he was not planning on coming to the ER today until his acquaintance told him that she was Covid positive.  Denies any pain at this time he is requesting Covid test.  Denies fever/chills, headache, hemoptysis, shortness of breath, sore throat, loss of taste/smell, myalgias/arthralgias, abdominal pain, nausea/vomiting, diarrhea, diaphoresis, numbness/weakness, tingling, vision changes, neck pain, swelling/color change of the extremities or any additional concerns  HPI     Past Medical History:  Diagnosis Date  . Allergic rhinitis   . Anxiety   . Atherosclerosis of aorta ([place])   . CAD (coronary artery disease) cardiologist-- dr Tressia Miners turner   long hx atypical chest pain--- cardiac cath 08-24-2002 in epic showed normal coronaries;   03-17-2007  nuclear stress test showed ?small infarct , ef 60%;  cath done  12-31-2007 for recurrent CP showed nonob cad;   08-27-2018 stress test showed intermediate risk w/ apex ischemia, nuclear ef 61%;   cath done 08-29-2018 showed Nonobstructive 25% plaque in LAD   . Chronic neck pain    narcotic dependant  . DDD (degenerative disc disease), cervical   . Depression   . Diabetic neuropathy ([place])   . Diverticulosis 2013   by CT scan  . Erectile dysfunction 08/01/2015  . Essential hypertension 01/01/2008  . Fatty liver 07/31/2018   By [place] 07/2018,  followed by pcp  . GERD (gastroesophageal reflux disease)   . Hemorrhoids   . History of anemia   . History of cocaine abuse ([place])    per pt quit 05/ 2004  . History of cocaine use 2004  . History of colon polyps   . History of epididymitis   . History of esophageal stricture    s/p  dilatation 2009;  12-30-2018  . History of gastric ulcer    12-30-2018  EGD showed non-bleeding gastric ulcer  . History of prostatitis 08/2011  . History of transient ischemic attack (TIA)    remote hx , related to cocaine abuse which pt quit 2005  . Hyperlipidemia   . Hypertension    Essential  . OSA (obstructive sleep apnea)    study 05-07-2008 in epic , mild ,  pt does not use cpap  . Pain due to onychomycosis of toenails of both feet 10/15/2018  . Prostate cancer [facility]) urologist--- dr herrick/  oncologist--- dr  manning   first dx 01/ 2018 w/ Stage T2a and Gleason 3+3 , active survillance;  repear bx 04-21-2019 Gleason 3+4,  scheduled for brachytherapy 07-03-2019  . T2DM (type 2 diabetes mellitus) ([place])    followed by pcp---  (03-04-201  checks blood sugar 3 times daily,  fasting sugar's-- 140-150    Patient Active Problem List   Diagnosis Date Noted  . Ascending aorta dilatation (HCC) 03/28/2019  . NSAID-induced gastric ulcer 01/07/2019  . Pain due to onychomycosis of toenails of both feet 10/15/2018  . CAD (coronary artery disease) 09/03/2018  . Abnormal stress test   . Chest pain 08/25/2018  . Tongue lesion  08/05/2018  . Fatty liver 07/31/2018  . Lightheadedness 08/06/2017  . Erectile dysfunction 08/01/2015  . Health maintenance examination 01/31/2015  . Gross hematuria   . Lipoma of neck 10/01/2014  . Allergic rhinitis 10/01/2014  . Diabetic neuropathy ([place]) 05/31/2014  . Advanced care planning/counseling discussion 01/26/2014  . OSA (obstructive sleep apnea)   . Tinea cruris 10/08/2012  . Malignant neoplasm of prostate ([place]) 06/16/2012  . Severe obesity (BMI 35.0-39.9) with comorbidity ([place]) 01/08/2012  . Esophageal dysphagia 10/10/2011  . Diverticulosis 10/09/2011  . 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 Insomnia 06/19/2011  . Shoulder pain 04/08/2011  . Neck pain, chronic 02/24/2011  . Medicare annual wellness visit, subsequent 10/26/2010  . Iron deficiency anemia 07/01/2009  . GERD 09/29/2008  . Essential hypertension 01/01/2008  . ANXIETY DEPRESSION 03/19/2007  . TIA (transient ischemic attack) 03/05/2007  . Back pain with radiculopathy 12/31/2006  . ABDOMINAL WALL HERNIA 10/03/2006  . Controlled diabetes mellitus type 2 with complications ([place]) 37/62/8315  . History of cocaine use 10/01/2006  . Hyperlipidemia associated with type 2 diabetes mellitus ([place]) 08/21/2005    Past Surgical History:  Procedure Laterality Date  . ANTERIOR CERVICAL DECOMP/DISCECTOMY FUSION  06-24-2008  and 10-28-2008 @[facility]    06-25-2008  C7 -- T1;    10-28-2008  C5 -- C7  . CARDIAC CATHETERIZATION  08-24-2002   dr gamble  @[facility]    normal coronaries  . CARDIAC CATHETERIZATION  12-31-2007    dr Johnsie Cancel   nonobstructive CAD  . CARPAL TUNNEL RELEASE Right 2000  approx.  Marland Kitchen CATARACT EXTRACTION W/ INTRAOCULAR LENS  IMPLANT, BILATERAL  2016  . COLONOSCOPY  10/11/2011   severe diverticulosis, int hemorrhoids, rec rpt 5 yrs Carlean Purl)  . COLONOSCOPY  12/2018   benign rectal polyp Carlean Purl)  . DIRECT LARYNGOSCOPY  11/02/2008   @[facility]    Extubation under anesthesia post op complication from ACDF  .  ESOPHAGOGASTRODUODENOSCOPY  10/11/2011   WNL Carlean Purl)  . ESOPHAGOGASTRODUODENOSCOPY  09/11-2018   gastric ulcer, gastritis, dilated esophageal stenosis, limit NSAIDs continue PPI, no f/u planned Carlean Purl)  . EXCISION MASS NECK N/A 08/29/2016   Procedure: EXCISION MASS POSTERIOR NECK;  Surgeon: Erroll Luna, MD;  Location: [facility];  Service: General;  Laterality: N/A;  . HEMATOMA EVACUATION  10/29/2008   @[facility]    post op ACDF 10-28-2008  . LAPAROSCOPIC INGUINAL HERNIA REPAIR Bilateral 11-22-2006   @[facility]    AND EXCISION RLQ ABDOMINAL LIPOMA  . LEFT HEART CATH AND CORONARY ANGIOGRAPHY N/A 08/29/2018   Procedure: LEFT HEART CATH AND CORONARY ANGIOGRAPHY;  Surgeon: Jettie Booze, MD;  Location: [facility];  Service: Cardiovascular;  Laterality: N/A;  . RADIOACTIVE SEED IMPLANT N/A 07/03/2019   Procedure: RADIOACTIVE SEED IMPLANT/BRACHYTHERAPY IMPLANT;  Surgeon: Ardis Hughs, MD;  Location[facility];  Service: Urology;  Laterality: N/A;  .  SHOULDER ARTHROSCOPY WITH SUBACROMIAL DECOMPRESSION AND OPEN ROTATOR C Left 01/23/2013   Procedure: LEFT SHOULDER ARTHROSCOPY WITH SUBACROMIAL DECOMPRESSION AND MINI OPEN ROTATOR CUFF REPAIR, AND OPEN DISTAL CLAVICLE RESECTION;  Surgeon: Augustin Schooling, MD;  Location: [facility];  Service: Orthopedics;  Laterality: Left;  . SHOULDER SURGERY Right 05/2011  . SPACE OAR INSTILLATION N/A 07/03/2019   Procedure: SPACE OAR INSTILLATION;  Surgeon: Ardis Hughs, MD;  Location: [facility];  Service: Urology;  Laterality: N/A;  . TONSILLECTOMY  child  . TRIGGER FINGER RELEASE Right 12-12-2005   @[facility]    Right long finger       Family History  Problem Relation Age of Onset  . Diabetes Mother   . Hypertension Mother   . Hyperlipidemia Mother   . Diabetes Father   . Diabetes Sister   . Hypertension Sister   . Hypertension Brother   . Diabetes Brother   . Diabetes Brother   . Hypertension Brother     . Diabetes Brother   . Heart disease Maternal Uncle        Heart failure  . Cancer Paternal Uncle        Prostate  . Cancer Paternal Grandfather        Prostate  . Stroke Neg Hx   . Esophageal cancer Neg Hx   . Rectal cancer Neg Hx   . Stomach cancer Neg Hx   . Breast cancer Neg Hx   . Colon cancer Neg Hx     Social History   Tobacco Use  . Smoking status: Never Smoker  . Smokeless tobacco: Never Used  Vaping Use  . Vaping Use: Never used  Substance Use Topics  . Alcohol use: No    Alcohol/week: 0.0 standard drinks  . Drug use: Not Currently    Comment: H/O marijuana (last 2005); Cocaine 5/04    Home Medications Prior to Admission medications   Medication Sig Start Date End Date Taking? Authorizing Provider  aspirin EC 81 MG tablet Take 81 mg by mouth daily. 10/25/11   Thurnell Lose, MD  atorvastatin (LIPITOR) 80 MG tablet TAKE 1 TABLET BY MOUTH  DAILY 10/11/19   Ria Bush, MD  baclofen (LIORESAL) 10 MG tablet TAKE 1 TABLET BY MOUTH AT  BEDTIME AS NEEDED FOR  MUSCLE SPASM(S) 12/01/19   Ria Bush, MD  benzonatate (TESSALON) 100 MG capsule Take 1 capsule (100 mg total) by mouth every 8 (eight) hours for 5 days. 12/13/19 12/18/19  Nuala Alpha A, PA-C  carvedilol (COREG) 3.125 MG tablet TAKE 1 TABLET BY MOUTH  TWICE DAILY WITH A MEAL 11/30/19   Ria Bush, MD  empagliflozin (JARDIANCE) 10 MG TABS tablet Take 10 mg by mouth daily. 10/28/18   Ria Bush, MD  FLOMAX 0.4 MG CAPS capsule Take 0.4 mg by mouth daily. 06/16/19   [provider]  flurbiprofen (ANSAID) 100 MG tablet Take 100 mg by mouth 2 (two) times daily. 06/09/19   [provider]  fluticasone (FLONASE) 50 MCG/ACT nasal spray Place 2 sprays into both nostrils daily. 05/11/19   Ria Bush, MD  gabapentin (NEURONTIN) 100 MG capsule TAKE 1 CAPSULE BY MOUTH TWO TIMES DAILY 10/28/18   Ria Bush, MD  glipiZIDE (GLUCOTROL) 10 MG tablet Take 10 mg by mouth daily.  06/16/19   [provider]  glucose blood (ONETOUCH ULTRA) test strip USE WITH METER TO CHECK  SUGAR TWICE DAILY. 04/15/19   Ria Bush, MD  LORazepam (ATIVAN) 1 MG tablet Take  1 tablet (1 mg total) by mouth as needed for anxiety (30 minutes prior to MRI and may repeat once, just prior to scan if needed). 07/15/19   Bruning, Ashlyn, PA-C  losartan (COZAAR) 50 MG tablet TAKE 1 TABLET BY MOUTH  DAILY 11/02/19   Ria Bush, MD  metFORMIN (GLUCOPHAGE) 1000 MG tablet TAKE 1 TABLET BY MOUTH  TWICE DAILY WITH A MEAL 11/02/19   Ria Bush, MD  omeprazole (PRILOSEC) 40 MG capsule TAKE 1 CAPSULE(40 MG) BY MOUTH DAILY 03/27/19   Gatha Mayer, MD  oxyCODONE-acetaminophen (PERCOCET) 10-325 MG tablet Take 1 tablet by mouth every 4 (four) hours as needed for pain. 07/03/19   Ardis Hughs, MD  phenazopyridine (PYRIDIUM) 200 MG tablet Take 1 tablet (200 mg total) by mouth 3 (three) times daily as needed for pain. 07/03/19   Ardis Hughs, MD    Allergies    Glimepiride and Menthol (topical analgesic)  Review of Systems   Review of Systems Ten systems are reviewed and are negative for acute change except as noted in the HPI  Physical Exam Updated Vital Signs BP 128/79   Pulse 79   Temp 99.5 F (37.5 C) (Oral)   Resp 18   SpO2 98%   Physical Exam Constitutional:      General: He is not in acute distress.    Appearance: Normal appearance. He is well-developed. He is not ill-appearing or diaphoretic.  HENT:     Head: Normocephalic and atraumatic.     Right Ear: External ear normal.     Left Ear: External ear normal.  Eyes:     General: Vision grossly intact. Gaze aligned appropriately.     Pupils: Pupils are equal, round, and reactive to light.  Neck:     Trachea: Trachea and phonation normal.  Cardiovascular:     Rate and Rhythm: Normal rate and regular rhythm.  Pulmonary:     Effort: Pulmonary effort is normal. No respiratory distress.     Breath sounds:  Normal breath sounds.  Abdominal:     General: There is no distension.     Palpations: Abdomen is soft.     Tenderness: There is no abdominal tenderness. There is no guarding or rebound.  Musculoskeletal:        General: Normal range of motion.     Cervical back: Normal range of motion.     Right lower leg: No edema.     Left lower leg: No edema.  Skin:    General: Skin is warm and dry.  Neurological:     Mental Status: He is alert.     GCS: GCS eye subscore is 4. GCS verbal subscore is 5. GCS motor subscore is 6.     Comments: Speech is clear and goal oriented, follows commands Major Cranial nerves without deficit, no facial droop Moves extremities without ataxia, coordination intact  Psychiatric:        Behavior: Behavior normal.     ED Results / Procedures / Treatments   Labs (all labs ordered are listed, but only abnormal results are displayed) Labs Reviewed  BASIC METABOLIC PANEL - Abnormal; Notable for the following components:      Result Value   Glucose, Bld 149 (*)    All other components within normal limits  SARS CORONAVIRUS 2 BY RT PCR (HOSPITAL ORDER, [facility])  CBC WITH DIFFERENTIAL/PLATELET  TROPONIN I (HIGH SENSITIVITY)    EKG EKG Interpretation  Date/Time:  Sunday  December 13 2019 11:32:57 EDT Ventricular Rate:  79 PR Interval:    QRS Duration: 90 QT Interval:  355 QTC Calculation: 407 R Axis:   -13 Text Interpretation: Sinus rhythm Inferior infarct, old Consider anterior infarct 12 Lead; Mason-Likar Confirmed by Virgel Manifold [phone]) on 12/13/2019 12:14:50 PM   Radiology DG Chest Port 1 View  Result Date: 12/13/2019 CLINICAL DATA:  Chest pain EXAM: PORTABLE CHEST 1 VIEW COMPARISON:  June 04, 2019 FINDINGS: Tortuous thoracic aorta. The cardiomediastinal silhouette is stable. No pneumothorax. No nodules or masses. No focal infiltrates. IMPRESSION: No active disease. Electronically Signed   By: Dorise Bullion III M.D    On: 12/13/2019 12:18    Procedures Procedures (including critical care time)  Medications Ordered in ED Medications - No data to display  ED Course  I have reviewed the triage vital signs and the nursing notes.  Pertinent labs & imaging results that were available during my care of the patient were reviewed by me and considered in my medical decision making (see chart for details).    MDM Rules/Calculators/A&P                          Additional history obtained from: 1. Nursing notes from this visit. 2. Electronic medical record. --------------------------------------------- I ordered, reviewed and interpreted labs which include: CBC within normal limits, no leukocytosis to suggest infection and no anemia. High-sensitivity troponin within normal limits, symptoms for the past 4 weeks no indication for delta troponin. BMP shows hyperglycemia at 149, no emergent Electra derangement, AKI or gap.  No evidence of DKA. Covid test negative.  EKG: Sinus rhythm Inferior infarct, old Consider anterior infarct 12 Lead; Mason-Likar Confirmed by Virgel Manifold [phone]) on 12/13/2019 12:14:50 PM  CXR:    IMPRESSION:  No active disease.  ---- Patient's work-up above overall reassuring.  Patient denies any active pain.  He reports he is here mainly for a COVID-19 test he is very happy that it was negative today.  He is requesting discharge as soon as possible.  Vital signs remained stable throughout ER visit.  Based on history, exam and work-up doubt ACS, PE, dissection, anemia, pneumonia or other emergent cardiopulmonary etiology of his symptoms today.  Patient states he is going to call his cardiologist and primary care doctor tomorrow morning to schedule follow-up appointment.  He has no additional concerns or complaints.  Symptoms today atypical, additionally no history of COPD doubt COPD exacerbation.  Will give patient cough medication and encourage home symptomatic therapies.  At this time  there does not appear to be any evidence of an acute emergency medical condition and the patient appears stable for discharge with appropriate outpatient follow up. Diagnosis was discussed with patient who verbalizes understanding of care plan and is agreeable to discharge. I have discussed return precautions with patient who verbalizes understanding. Patient encouraged to follow-up with their PCP. All questions answered.  Patient's case discussed with Dr. Wilson Singer who agrees with plan to discharge with follow-up.   Note: Portions of this report may have been transcribed using voice recognition software. Every effort was made to ensure accuracy; however, inadvertent computerized transcription errors may still be present. Final Clinical Impression(s) / ED Diagnoses Final diagnoses:  Cough  Atypical chest pain    Rx / DC Orders ED Discharge Orders         Ordered    benzonatate (TESSALON) 100 MG capsule  Every 8 hours  12/13/19 [place], Rubylee Zamarripa A, PA-C 12/13/19 1427    Virgel Manifold, MD 12/15/19 1116

## 2019-12-13 NOTE — Discharge Instructions (Addendum)
At this time there does not appear to be the presence of an emergent medical condition, however there is always the potential for conditions to change. Please read and follow the below instructions.  Please return to the Emergency Department immediately for any new or worsening symptoms. Please be sure to follow up with your Primary Care Provider within one week regarding your visit today; please call their office to schedule an appointment even if you are feeling better for a follow-up visit. Please drink plenty of water and get plenty of rest.  You have been prescribed Tessalon to help with your cough.  Please see your primary care doctor and your cardiologist for follow-up appointments.  Get help right away if: You have a cough that gets worse, or you cough up blood. You have very bad (severe) pain in your belly (abdomen). You pass out (faint). You have either of these for no clear reason: Sudden chest discomfort. Sudden discomfort in your arms, back, neck, or jaw. You have shortness of breath at any time. You suddenly start to sweat, or your skin gets clammy. You feel sick to your stomach (nauseous). You throw up (vomit). You suddenly feel lightheaded or dizzy. You feel very weak or tired. Your heart starts to beat fast, or it feels like it is skipping beats. You have any new/concerning or worsening of symptoms These symptoms may be an emergency. Do not wait to see if the symptoms will go away. Get medical help right away. Call your local emergency services (911 in the [place] Do not drive yourself to the hospital.  Please read the additional information packets attached to your discharge summary.  Do not take your medicine if  develop an itchy rash, swelling in your mouth or lips, or difficulty breathing; call 911 and seek immediate emergency medical attention if this occurs.  You may review your lab tests and imaging results in their entirety on your MyChart account.  Please discuss  all results of fully with your primary care provider and other specialist at your follow-up visit.  Note: Portions of this text may have been transcribed using voice recognition software. Every effort was made to ensure accuracy; however, inadvertent computerized transcription errors may still be present.

## 2019-12-13 NOTE — ED Triage Notes (Signed)
Pt states that been feeling dizzy, having cough up white and dark colored phlegm, hurts when coughs for 4 weeks. Been exposed to covid by two people. Reports Friday night having tightness in right side of face and chest, denies those symptoms now.

## 2019-12-18 ENCOUNTER — Ambulatory Visit: Payer: Medicare Other | Admitting: Podiatry

## 2019-12-28 ENCOUNTER — Other Ambulatory Visit: Payer: Self-pay | Admitting: Family Medicine

## 2019-12-29 DIAGNOSIS — M5412 Radiculopathy, cervical region: Secondary | ICD-10-CM | POA: Diagnosis not present

## 2019-12-30 ENCOUNTER — Other Ambulatory Visit: Payer: Self-pay | Admitting: Family Medicine

## 2019-12-30 NOTE — Telephone Encounter (Signed)
Pt requesting 30-day rx sent to local pharmacy and 90-day sent to [facility].   Gabapentin Last rx: 10/28/18, #180 Last OV: 11/03/19, AWV prt 2 Next OV:  05/10/20,  6 mo f/u

## 2019-12-30 NOTE — Telephone Encounter (Signed)
Pt called checking on his rx for  flonase Gabapentin  He stated [facility] told him they  Have not heard from office  Pt has only enough gabapentin for 1 more day.  Ask if pt wanted gabapentin called into local pharmacy till he gets rx from [facility] pt declined

## 2019-12-31 MED ORDER — GABAPENTIN 100 MG PO CAPS: 100.0000 mg | ORAL_CAPSULE | Freq: Two times a day (BID) | ORAL | 0 refills | Status: DC

## 2019-12-31 MED ORDER — GABAPENTIN 100 MG PO CAPS
ORAL_CAPSULE | ORAL | 3 refills | Status: DC
Start: 2019-12-31 — End: 2021-01-16

## 2019-12-31 NOTE — Telephone Encounter (Signed)
Refilled locally and to mail order

## 2020-01-03 ENCOUNTER — Other Ambulatory Visit: Payer: Self-pay | Admitting: Family Medicine

## 2020-01-03 DIAGNOSIS — E118 Type 2 diabetes mellitus with unspecified complications: Secondary | ICD-10-CM

## 2020-01-21 DIAGNOSIS — M961 Postlaminectomy syndrome, not elsewhere classified: Secondary | ICD-10-CM | POA: Diagnosis not present

## 2020-01-21 DIAGNOSIS — M5030 Other cervical disc degeneration, unspecified cervical region: Secondary | ICD-10-CM | POA: Diagnosis not present

## 2020-01-28 ENCOUNTER — Other Ambulatory Visit: Payer: Self-pay | Admitting: Family Medicine

## 2020-01-28 NOTE — Telephone Encounter (Signed)
Request denied.    Rx sent to [facility] 12/31/19, #180/3.

## 2020-02-07 ENCOUNTER — Other Ambulatory Visit: Payer: Self-pay | Admitting: Family Medicine

## 2020-02-08 NOTE — Telephone Encounter (Signed)
Received a refill for the following 2 medications:  Baclofen 10 mg  Glipizide 10 mg  LOV 11/03/19 (Physical) FOV 05/10/20  Please review and advise.  Thanks.  Dm/cma

## 2020-02-10 NOTE — Telephone Encounter (Signed)
ERx 

## 2020-02-12 ENCOUNTER — Other Ambulatory Visit: Payer: Self-pay | Admitting: Family Medicine

## 2020-02-13 IMAGING — CT CT NECK WITH CONTRAST
4 of 5 series · 15 of 33 positions shown, 17 images · IV contrast (omnipaque)
Comparison: Cervical spine CT 02/18/2012.

CLINICAL DATA: 67-year-old male with ?abscess under tongue x3
weeks, recent white drainage?.

EXAM:
CT NECK WITH CONTRAST
TECHNIQUE: Multidetector CT imaging of the neck was performed using the
standard protocol following the bolus administration of intravenous
contrast.
CONTRAST:  75mL OMNIPAQUE IOHEXOL 300 MG/ML  SOLN

[Series 2: axial neck · axial · 0.41mm/px · z∈[+395,+531]mm · 4 of 114 slices shown, 5 images]
[im 23/114  soft-tissue]
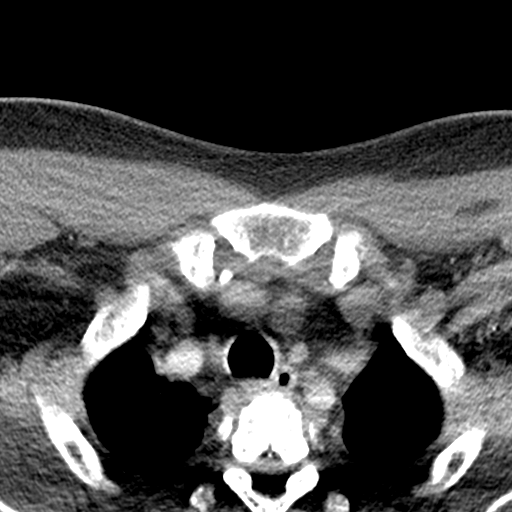
[im 23/114  bone]
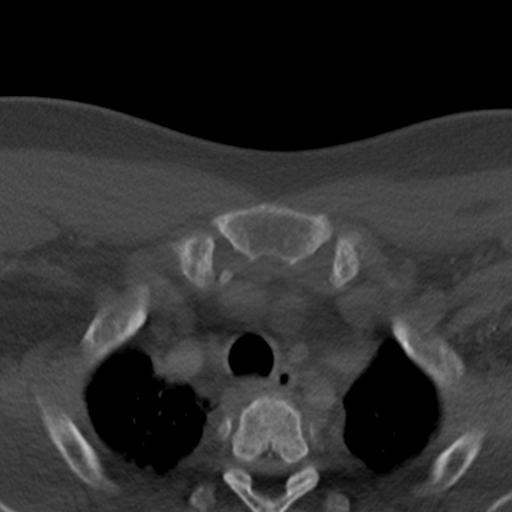
[im 46/114  bone]
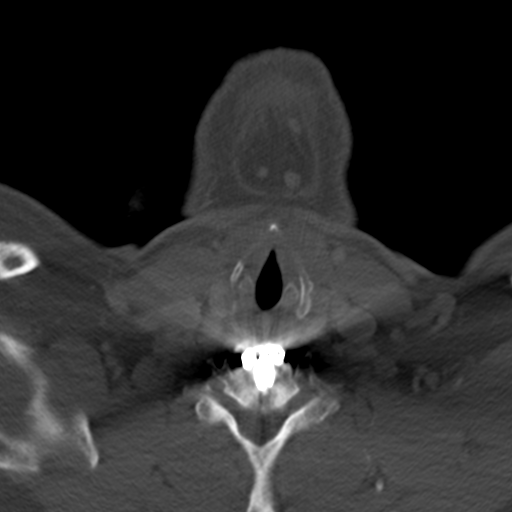
[im 68/114  bone]
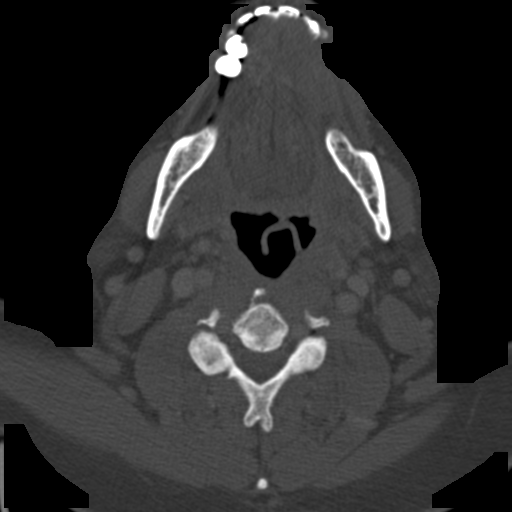
[im 91/114  bone]
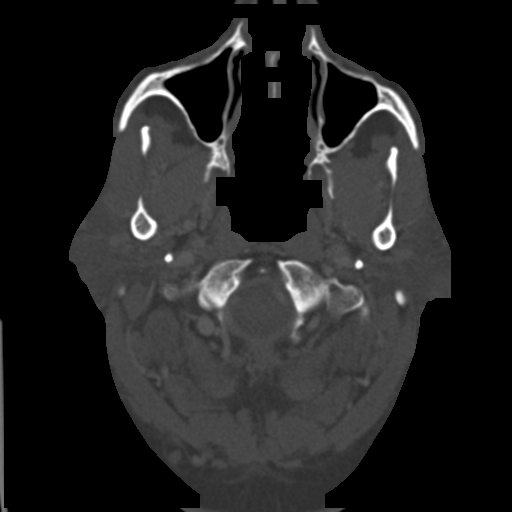

[Series 6: ax · axial · 0.39mm/px · z∈[+367,+466]mm · 3 of 126 slices shown]
[im 26/126  bone]
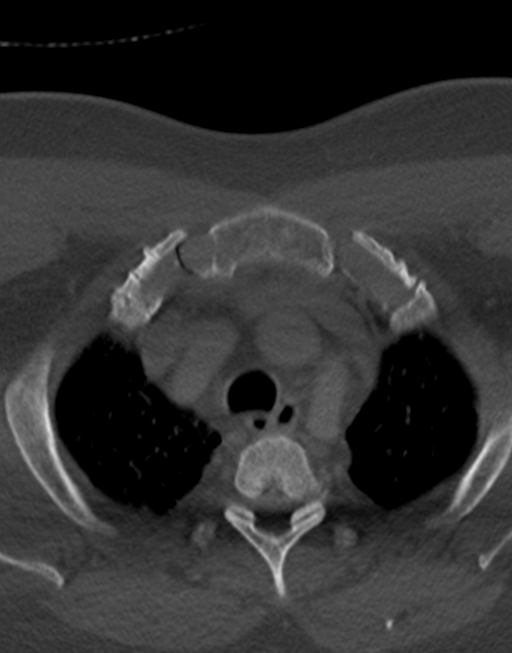
[im 51/126  bone]
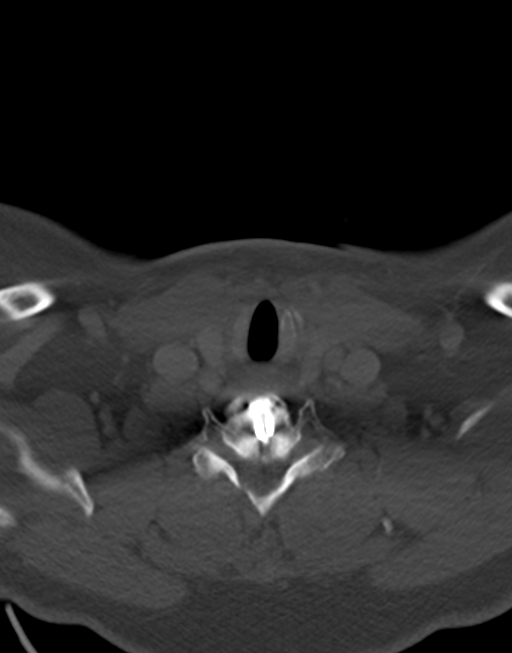
[im 76/126  bone]
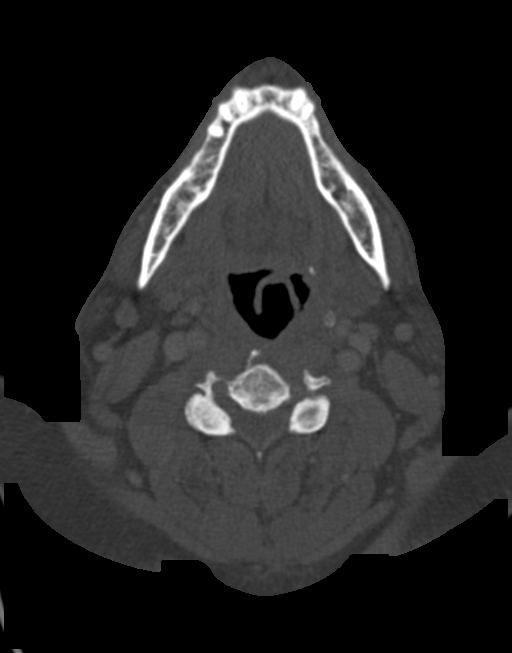

[Series 7: cor neck · coronal · 0.42mm/px · 3 of 114 slices shown]
[im 23/114  bone]
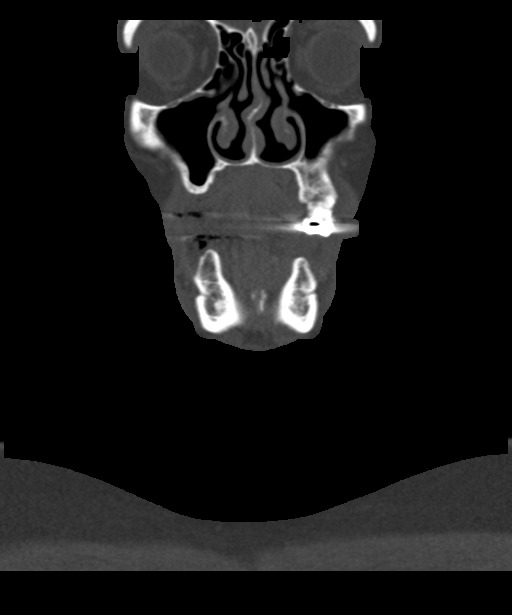
[im 46/114  bone]
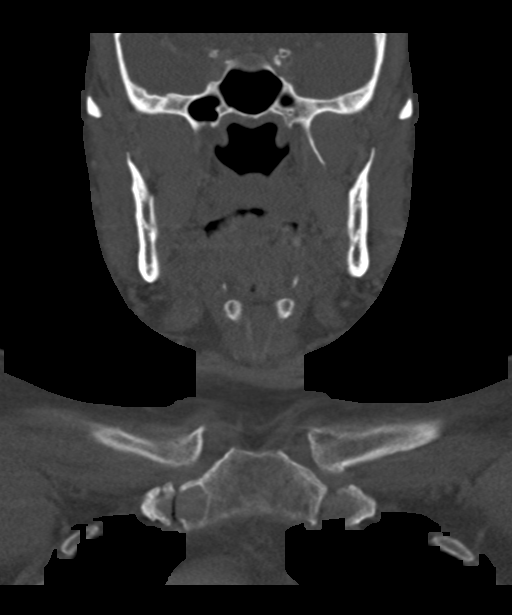
[im 68/114  bone]
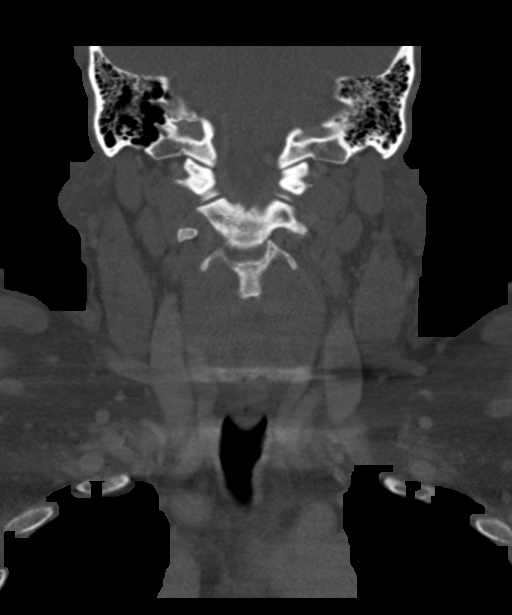

[Series 8: sag neck · sagittal · 0.46mm/px · 5 of 101 slices shown, 6 images]
[im 34/101  bone]
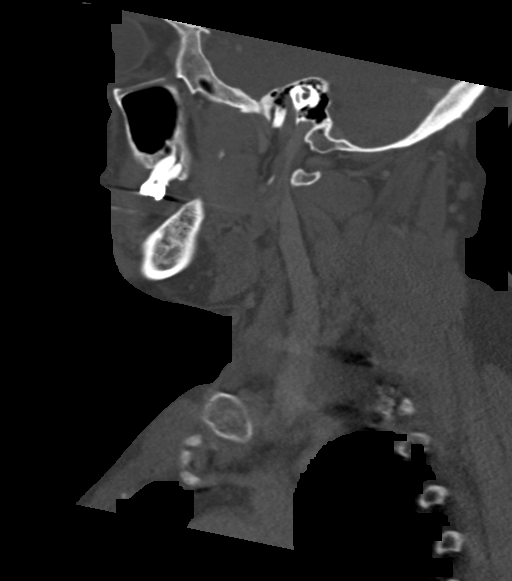
[im 42/101  bone]
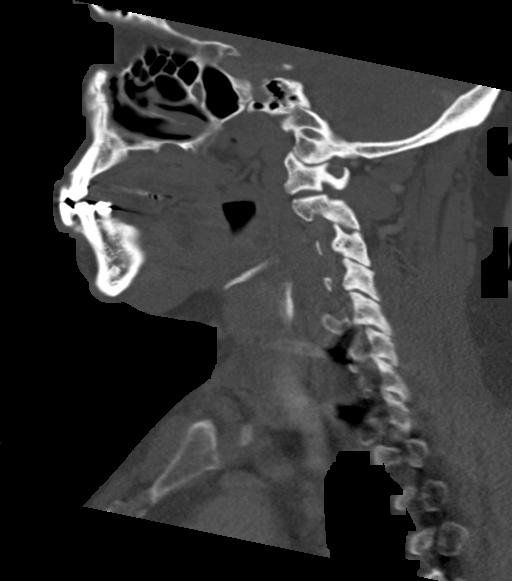
[im 51/101  soft-tissue]
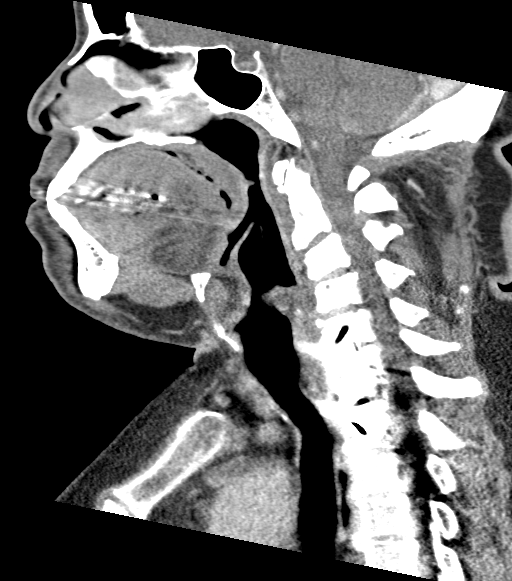
[im 51/101  bone]
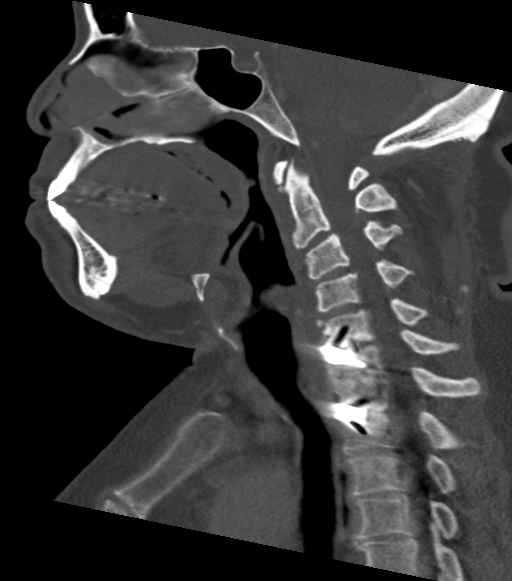
[im 59/101  bone]
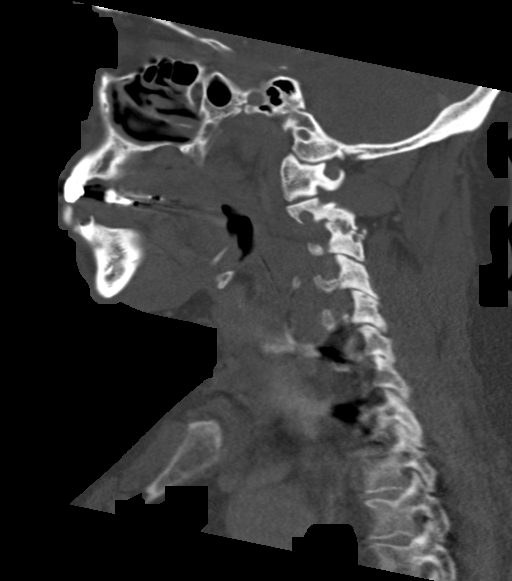
[im 67/101  bone]
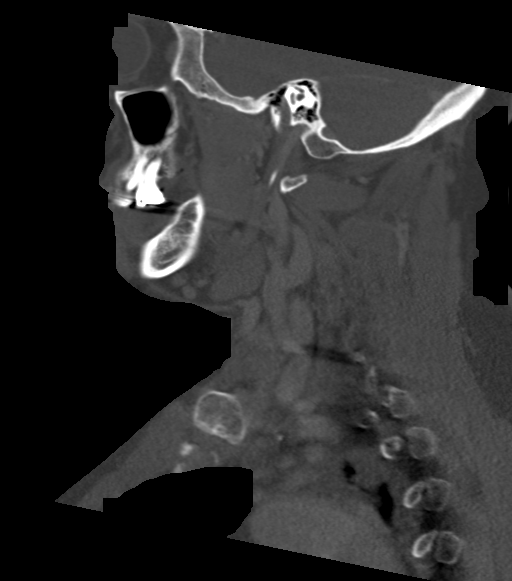

[15 of 33 positions shown; findings below may reference images not displayed]

FINDINGS: Pharynx and larynx: Mild motion artifact at the larynx which appears
to remain within normal limits. Pharyngeal soft tissue contours are
within normal limits.

Negative parapharyngeal and retropharyngeal spaces.

Oropharynx/oral tongue appears within normal limits. Sublingual
space described below.

Salivary glands: The sublingual space appears symmetric and within
normal limits. No sublingual mass or fluid collection identified.
There is carious mandible anterior left bicuspid and right canine
dentition with some periapical lucency but no other complicating
features evident.

Submandibular spaces and glands appear symmetric and within normal
limits. Parotid glands are within normal limits.

Thyroid: Negative.

Lymph nodes: Negative. No lymphadenopathy.

Vascular: Major vascular structures in the neck and at the skull
base appear patent. Tortuous cervical ICAs and proximal left CCA.

Limited intracranial: Negative.

Visualized orbits: Negative aside from postoperative changes to both
globes.

Mastoids and visualized paranasal sinuses: Clear bilaterally.

Skeleton: Mandible dental caries described above.

Prior C5-C6 and C7-T1 ACDF. Solid C6-C7 arthrodesis. Suspected
pseudoarthrosis at C5-C6. Possible arthrodesis at C7-T1.

No acute osseous abnormality identified.

Upper chest: Tortuous proximal great vessels but otherwise negative
visible superior mediastinum. Lung apices appears stable and
negative.
IMPRESSION: 1. Mandibular dental disease, but no abscess, lingual or sublingual
abnormality identified.
2. Previous C5-C6 through C7-T1 ACDF with evidence of C5-C6
pseudoarthrosis.

## 2020-03-07 ENCOUNTER — Telehealth: Payer: Self-pay

## 2020-03-07 ENCOUNTER — Emergency Department (HOSPITAL_COMMUNITY): Payer: Medicare Other

## 2020-03-07 ENCOUNTER — Other Ambulatory Visit: Payer: Self-pay

## 2020-03-07 ENCOUNTER — Emergency Department (HOSPITAL_COMMUNITY)
Admission: EM | Admit: 2020-03-07 | Discharge: 2020-03-07 | Disposition: A | Discharge status: Home / Self Care | Payer: Medicare Other | Attending: Emergency Medicine | Admitting: Emergency Medicine

## 2020-03-07 DIAGNOSIS — R404 Transient alteration of awareness: Secondary | ICD-10-CM | POA: Diagnosis not present

## 2020-03-07 DIAGNOSIS — R0789 Other chest pain: Secondary | ICD-10-CM | POA: Diagnosis not present

## 2020-03-07 DIAGNOSIS — I2510 Atherosclerotic heart disease of native coronary artery without angina pectoris: Secondary | ICD-10-CM | POA: Insufficient documentation

## 2020-03-07 DIAGNOSIS — Z8546 Personal history of malignant neoplasm of prostate: Secondary | ICD-10-CM | POA: Insufficient documentation

## 2020-03-07 DIAGNOSIS — Z7984 Long term (current) use of oral hypoglycemic drugs: Secondary | ICD-10-CM | POA: Diagnosis not present

## 2020-03-07 DIAGNOSIS — R42 Dizziness and giddiness: Secondary | ICD-10-CM | POA: Diagnosis not present

## 2020-03-07 DIAGNOSIS — Z7982 Long term (current) use of aspirin: Secondary | ICD-10-CM | POA: Insufficient documentation

## 2020-03-07 DIAGNOSIS — Z79899 Other long term (current) drug therapy: Secondary | ICD-10-CM | POA: Insufficient documentation

## 2020-03-07 DIAGNOSIS — R6889 Other general symptoms and signs: Secondary | ICD-10-CM | POA: Diagnosis not present

## 2020-03-07 DIAGNOSIS — I10 Essential (primary) hypertension: Secondary | ICD-10-CM | POA: Insufficient documentation

## 2020-03-07 DIAGNOSIS — E1140 Type 2 diabetes mellitus with diabetic neuropathy, unspecified: Secondary | ICD-10-CM | POA: Diagnosis not present

## 2020-03-07 DIAGNOSIS — Z743 Need for continuous supervision: Secondary | ICD-10-CM | POA: Diagnosis not present

## 2020-03-07 DIAGNOSIS — R079 Chest pain, unspecified: Secondary | ICD-10-CM | POA: Diagnosis not present

## 2020-03-07 DIAGNOSIS — R0989 Other specified symptoms and signs involving the circulatory and respiratory systems: Secondary | ICD-10-CM

## 2020-03-07 LAB — CBC WITH DIFFERENTIAL/PLATELET
Abs Immature Granulocytes: 0.02 10*3/uL (ref 0.00–0.07)
Basophils Absolute: 0 10*3/uL (ref 0.0–0.1)
Basophils Relative: 0 %
Eosinophils Absolute: 0.1 10*3/uL (ref 0.0–0.5)
Eosinophils Relative: 1 %
HCT: 40.7 % (ref 39.0–52.0)
Hemoglobin: 13.3 g/dL (ref 13.0–17.0)
Immature Granulocytes: 0 %
Lymphocytes Relative: 23 %
Lymphs Abs: 1.1 10*3/uL (ref 0.7–4.0)
MCH: 29.8 pg (ref 26.0–34.0)
MCHC: 32.7 g/dL (ref 30.0–36.0)
MCV: 91.3 fL (ref 80.0–100.0)
Monocytes Absolute: 0.5 10*3/uL (ref 0.1–1.0)
Monocytes Relative: 11 %
Neutro Abs: 3.2 10*3/uL (ref 1.7–7.7)
Neutrophils Relative %: 65 %
Platelets: 263 10*3/uL (ref 150–400)
RBC: 4.46 MIL/uL (ref 4.22–5.81)
RDW: 14.8 % (ref 11.5–15.5)
WBC: 5 10*3/uL (ref 4.0–10.5)
nRBC: 0 % (ref 0.0–0.2)

## 2020-03-07 LAB — COMPREHENSIVE METABOLIC PANEL
ALT: 21 U/L (ref 0–44)
AST: 18 U/L (ref 15–41)
Albumin: 4 g/dL (ref 3.5–5.0)
Alkaline Phosphatase: 47 U/L (ref 38–126)
Anion gap: 10 (ref 5–15)
BUN: 10 mg/dL (ref 8–23)
CO2: 26 mmol/L (ref 22–32)
Calcium: 8.8 mg/dL — ABNORMAL LOW (ref 8.9–10.3)
Chloride: 104 mmol/L (ref 98–111)
Creatinine, Ser: 0.93 mg/dL (ref 0.61–1.24)
GFR, Estimated: >60 mL/min (ref >60)
Glucose, Bld: 95 mg/dL (ref 70–99)
Potassium: 4.5 mmol/L (ref 3.5–5.1)
Sodium: 140 mmol/L (ref 135–145)
Total Bilirubin: 0.5 mg/dL (ref 0.3–1.2)
Total Protein: 7.5 g/dL (ref 6.5–8.1)

## 2020-03-07 LAB — CBG MONITORING, ED: Glucose-Capillary: 84 mg/dL (ref 70–99)

## 2020-03-07 LAB — TROPONIN I (HIGH SENSITIVITY): Troponin I (High Sensitivity): 6 ng/L (ref <18)

## 2020-03-07 MED ORDER — OXYCODONE-ACETAMINOPHEN 5-325 MG PO TABS
2.0000 | ORAL_TABLET | Freq: Once | ORAL | Status: AC
  Administered 2020-03-07: 2 via ORAL
  Filled 2020-03-07: qty 2

## 2020-03-07 NOTE — ED Notes (Signed)
Pt given applesauce and diet coke.

## 2020-03-07 NOTE — Telephone Encounter (Signed)
Per chart review tab pt is at [facility] ED.

## 2020-03-07 NOTE — ED Triage Notes (Signed)
Pt BIBA from home-  Per EMS- Pt c/o dizziness x3 months, constant, worse with movement.  Intermittent posterior headaches, left sided chest pain x3 months.  AOx4, ambulatory.  200/110 --> 154/84.   NSR

## 2020-03-07 NOTE — Telephone Encounter (Signed)
Will await ER eval

## 2020-03-07 NOTE — ED Provider Notes (Signed)
[facility] DEPT Provider Note   CSN: [id number] Arrival date & time: 03/07/20  1252     History Chief Complaint  Patient presents with  . Dizziness    [name] is a 69 y.o. male.  69 year old male with extensive past medical history below including CAD, chronic neck pain on chronic opiates, hypertension, OSA who presents with dizziness.  Patient reports the months of intermittent dizziness which she describes as a lightheadedness that particularly happens when he stands up.  He denies any room spinning sensation.  He states that it often gets better if he walks around for a while or if he lays down still for a while.  No associated vision changes, vomiting, or focal weakness/numbness.  He occasionally has a posterior headache starting at the neck and base of skull that improves with topical muscle pain medication.  Patient also notes several months of intermittent left-sided chest pain that is a pressure sensation lasting a couple of seconds and radiates down the left side of his body including arm and leg.  He has no associated shortness of breath, vomiting, or diaphoresis.  The pain comes and goes randomly and is not associated with exertion.  He has had this pain previously worked up in the ED.  The history is provided by the patient.  Dizziness      Past Medical History:  Diagnosis Date  . Allergic rhinitis   . Anxiety   . Atherosclerosis of aorta ([place])   . CAD (coronary artery disease) cardiologist-- dr Tressia Miners turner   long hx atypical chest pain--- cardiac cath 08-24-2002 in epic showed normal coronaries;   03-17-2007  nuclear stress test showed ?small infarct , ef 60%;  cath done 12-31-2007 for recurrent CP showed nonob cad;   08-27-2018 stress test showed intermediate risk w/ apex ischemia, nuclear ef 61%;   cath done 08-29-2018 showed Nonobstructive 25% plaque in LAD   . Chronic neck pain    narcotic dependant  . DDD (degenerative disc  disease), cervical   . Depression   . Diabetic neuropathy ([place])   . Diverticulosis 2013   by CT scan  . Erectile dysfunction 08/01/2015  . Essential hypertension 01/01/2008  . Fatty liver 07/31/2018   By [place] 07/2018,  followed by pcp  . GERD (gastroesophageal reflux disease)   . Hemorrhoids   . History of anemia   . History of cocaine abuse ([place])    per pt quit 05/ 2004  . History of cocaine use 2004  . History of colon polyps   . History of epididymitis   . History of esophageal stricture    s/p  dilatation 2009;  12-30-2018  . History of gastric ulcer    12-30-2018  EGD showed non-bleeding gastric ulcer  . History of prostatitis 08/2011  . History of transient ischemic attack (TIA)    remote hx , related to cocaine abuse which pt quit 2005  . Hyperlipidemia   . Hypertension    Essential  . OSA (obstructive sleep apnea)    study 05-07-2008 in epic , mild ,  pt does not use cpap  . Pain due to onychomycosis of toenails of both feet 10/15/2018  . Prostate cancer [facility]) urologist--- dr herrick/  oncologist--- dr Tammi Klippel   first dx 01/ 2018 w/ Stage T2a and Gleason 3+3 , active survillance;  repear bx 04-21-2019 Gleason 3+4,  scheduled for brachytherapy 07-03-2019  . T2DM (type 2 diabetes mellitus) ([place])    followed by pcp---  (  03-04-201  checks blood sugar 3 times daily,  fasting sugar's-- 140-150    Patient Active Problem List   Diagnosis Date Noted  . Ascending aorta dilatation (HCC) 03/28/2019  . NSAID-induced gastric ulcer 01/07/2019  . Pain due to onychomycosis of toenails of both feet 10/15/2018  . CAD (coronary artery disease) 09/03/2018  . Abnormal stress test   . Chest pain 08/25/2018  . Tongue lesion 08/05/2018  . Fatty liver 07/31/2018  . Lightheadedness 08/06/2017  . Erectile dysfunction 08/01/2015  . Health maintenance examination 01/31/2015  . Gross hematuria   . Lipoma of neck 10/01/2014  . Allergic rhinitis 10/01/2014  . Diabetic neuropathy ([place]) 05/31/2014   . Advanced care planning/counseling discussion 01/26/2014  . OSA (obstructive sleep apnea)   . Tinea cruris 10/08/2012  . Malignant neoplasm of prostate ([place]) 06/16/2012  . Severe obesity (BMI 35.0-39.9) with comorbidity ([place]) 01/08/2012  . Esophageal dysphagia 10/10/2011  . Diverticulosis 10/09/2011  . 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 Insomnia 06/19/2011  . Shoulder pain 04/08/2011  . Neck pain, chronic 02/24/2011  . Medicare annual wellness visit, subsequent 10/26/2010  . Iron deficiency anemia 07/01/2009  . GERD 09/29/2008  . Essential hypertension 01/01/2008  . ANXIETY DEPRESSION 03/19/2007  . TIA (transient ischemic attack) 03/05/2007  . Back pain with radiculopathy 12/31/2006  . ABDOMINAL WALL HERNIA 10/03/2006  . Controlled diabetes mellitus type 2 with complications ([place]) 95/18/8416  . History of cocaine use 10/01/2006  . Hyperlipidemia associated with type 2 diabetes mellitus ([place]) 08/21/2005    Past Surgical History:  Procedure Laterality Date  . ANTERIOR CERVICAL DECOMP/DISCECTOMY FUSION  06-24-2008  and 10-28-2008 @[facility]    06-25-2008  C7 -- T1;    10-28-2008  C5 -- C7  . CARDIAC CATHETERIZATION  08-24-2002   dr gamble  @[facility]    normal coronaries  . CARDIAC CATHETERIZATION  12-31-2007    dr Johnsie Cancel   nonobstructive CAD  . CARPAL TUNNEL RELEASE Right 2000  approx.  Marland Kitchen CATARACT EXTRACTION W/ INTRAOCULAR LENS  IMPLANT, BILATERAL  2016  . COLONOSCOPY  10/11/2011   severe diverticulosis, int hemorrhoids, rec rpt 5 yrs Carlean Purl)  . COLONOSCOPY  12/2018   benign rectal polyp Carlean Purl)  . DIRECT LARYNGOSCOPY  11/02/2008   @[facility]    Extubation under anesthesia post op complication from ACDF  . ESOPHAGOGASTRODUODENOSCOPY  10/11/2011   WNL Carlean Purl)  . ESOPHAGOGASTRODUODENOSCOPY  09/11-2018   gastric ulcer, gastritis, dilated esophageal stenosis, limit NSAIDs continue PPI, no f/u planned Carlean Purl)  . EXCISION MASS NECK N/A 08/29/2016   Procedure: EXCISION MASS POSTERIOR NECK;   Surgeon: Erroll Luna, MD;  Location: [facility];  Service: General;  Laterality: N/A;  . HEMATOMA EVACUATION  10/29/2008   @[facility]    post op ACDF 10-28-2008  . LAPAROSCOPIC INGUINAL HERNIA REPAIR Bilateral 11-22-2006   @[facility]    AND EXCISION RLQ ABDOMINAL LIPOMA  . LEFT HEART CATH AND CORONARY ANGIOGRAPHY N/A 08/29/2018   Procedure: LEFT HEART CATH AND CORONARY ANGIOGRAPHY;  Surgeon: Jettie Booze, MD;  Location: [facility];  Service: Cardiovascular;  Laterality: N/A;  . RADIOACTIVE SEED IMPLANT N/A 07/03/2019   Procedure: RADIOACTIVE SEED IMPLANT/BRACHYTHERAPY IMPLANT;  Surgeon: Ardis Hughs, MD;  Location: [facility];  Service: Urology;  Laterality: N/A;  . SHOULDER ARTHROSCOPY WITH SUBACROMIAL DECOMPRESSION AND OPEN ROTATOR C Left 01/23/2013   Procedure: LEFT SHOULDER ARTHROSCOPY WITH SUBACROMIAL DECOMPRESSION AND MINI OPEN ROTATOR CUFF REPAIR, AND OPEN DISTAL CLAVICLE RESECTION;  Surgeon: Augustin Schooling, MD;  Location: [facility]  OR;  Service: Orthopedics;  Laterality: Left;  . SHOULDER SURGERY Right 05/2011  . SPACE OAR INSTILLATION N/A 07/03/2019   Procedure: SPACE OAR INSTILLATION;  Surgeon: Ardis Hughs, MD;  Location: [facility];  Service: Urology;  Laterality: N/A;  . TONSILLECTOMY  child  . TRIGGER FINGER RELEASE Right 12-12-2005   @[facility]    Right long finger       Family History  Problem Relation Age of Onset  . Diabetes Mother   . Hypertension Mother   . Hyperlipidemia Mother   . Diabetes Father   . Diabetes Sister   . Hypertension Sister   . Hypertension Brother   . Diabetes Brother   . Diabetes Brother   . Hypertension Brother   . Diabetes Brother   . Heart disease Maternal Uncle        Heart failure  . Cancer Paternal Uncle        Prostate  . Cancer Paternal Grandfather        Prostate  . Stroke Neg Hx   . Esophageal cancer Neg Hx   . Rectal cancer Neg Hx   . Stomach cancer Neg Hx   . Breast cancer  Neg Hx   . Colon cancer Neg Hx     Social History   Tobacco Use  . Smoking status: Never Smoker  . Smokeless tobacco: Never Used  Vaping Use  . Vaping Use: Never used  Substance Use Topics  . Alcohol use: No    Alcohol/week: 0.0 standard drinks  . Drug use: Not Currently    Comment: H/O marijuana (last 2005); Cocaine 5/04    Home Medications Prior to Admission medications   Medication Sig Start Date End Date Taking? Authorizing Provider  aspirin EC 81 MG tablet Take 81 mg by mouth daily. 10/25/11  Yes Thurnell Lose, MD  atorvastatin (LIPITOR) 80 MG tablet TAKE 1 TABLET BY MOUTH  DAILY Patient taking differently: Take 80 mg by mouth daily.  10/11/19  Yes Ria Bush, MD  baclofen (LIORESAL) 10 MG tablet TAKE 1 TABLET BY MOUTH AT  BEDTIME AS NEEDED FOR  MUSCLE SPASM(S) Patient taking differently: Take 10 mg by mouth at bedtime as needed for muscle spasms.  02/10/20  Yes Ria Bush, MD  carvedilol (COREG) 3.125 MG tablet TAKE 1 TABLET BY MOUTH  TWICE DAILY WITH A MEAL Patient taking differently: Take 3.125 mg by mouth 2 (two) times daily with a meal.  11/30/19  Yes Ria Bush, MD  FLOMAX 0.4 MG CAPS capsule Take 0.4 mg by mouth daily. 06/16/19  Yes [provider]  fluticasone (FLONASE) 50 MCG/ACT nasal spray USE 2 SPRAYS IN BOTH  NOSTRILS DAILY 12/30/19  Yes Ria Bush, MD  gabapentin (NEURONTIN) 100 MG capsule TAKE 1 CAPSULE BY MOUTH TWO TIMES DAILY Patient taking differently: Take 100 mg by mouth 2 (two) times daily.  12/31/19  Yes Ria Bush, MD  glipiZIDE (GLUCOTROL XL) 5 MG 24 hr tablet TAKE 1 TABLET BY MOUTH  DAILY WITH BREAKFAST Patient taking differently: Take 5 mg by mouth daily with breakfast.  02/10/20  Yes Ria Bush, MD  guaiFENesin (MUCINEX) 600 MG 12 hr tablet Take 600 mg by mouth 2 (two) times daily as needed for cough.   Yes [provider]  losartan (COZAAR) 50 MG tablet TAKE 1 TABLET BY MOUTH  DAILY Patient  taking differently: Take 50 mg by mouth daily.  02/12/20  Yes Ria Bush, MD  metFORMIN (GLUCOPHAGE) 1000 MG tablet TAKE 1  TABLET BY MOUTH  TWICE DAILY WITH A MEAL Patient taking differently: Take 1,000 mg by mouth 2 (two) times daily with a meal.  02/12/20  Yes Ria Bush, MD  omeprazole (PRILOSEC) 40 MG capsule TAKE 1 CAPSULE(40 MG) BY MOUTH DAILY Patient taking differently: Take 40 mg by mouth daily.  03/27/19  Yes Gatha Mayer, MD  oxyCODONE-acetaminophen (PERCOCET) 10-325 MG tablet Take 1 tablet by mouth every 4 (four) hours as needed for pain. Patient taking differently: Take 1 tablet by mouth every 4 (four) hours.  07/03/19  Yes Ardis Hughs, MD  polyvinyl alcohol (LIQUIFILM TEARS) 1.4 % ophthalmic solution Place 1 drop into both eyes as needed for dry eyes.   Yes [provider]  empagliflozin (JARDIANCE) 10 MG TABS tablet Take 10 mg by mouth daily. Patient not taking: Reported on 03/07/2020 10/28/18   Ria Bush, MD  glucose blood [facility] ULTRA) test strip USE WITH METER TO CHECK  BLOOD SUGAR TWICE DAILY 01/04/20   Ria Bush, MD    Allergies    Glimepiride and Menthol (topical analgesic)  Review of Systems   Review of Systems  Neurological: Positive for dizziness.   All other systems reviewed and are negative except that which was mentioned in HPI  Physical Exam Updated Vital Signs BP 140/85   Pulse (!) 59   Temp 98.4 F (36.9 C) (Oral)   Resp 18   SpO2 99%   Physical Exam Vitals and nursing note reviewed.  Constitutional:      General: He is not in acute distress.    Appearance: He is well-developed.     Comments: Awake, alert  HENT:     Head: Normocephalic and atraumatic.  Eyes:     Extraocular Movements: Extraocular movements intact.     Conjunctiva/sclera: Conjunctivae normal.     Pupils: Pupils are equal, round, and reactive to light.  Cardiovascular:     Rate and Rhythm: Normal rate and regular rhythm.     Heart  sounds: Normal heart sounds. No murmur heard.   Pulmonary:     Effort: Pulmonary effort is normal. No respiratory distress.     Breath sounds: Normal breath sounds.  Abdominal:     General: Bowel sounds are normal. There is no distension.     Palpations: Abdomen is soft.     Tenderness: There is no abdominal tenderness.  Musculoskeletal:     Cervical back: Neck supple.  Skin:    General: Skin is warm and dry.  Neurological:     Mental Status: He is alert and oriented to person, place, and time.     Cranial Nerves: No cranial nerve deficit.     Motor: No abnormal muscle tone.     Deep Tendon Reflexes: Reflexes are normal and symmetric.     Comments: Fluent speech, normal finger-to-nose testing, no clonus 5/5 strength and normal sensation x all 4 extremities  Psychiatric:        Thought Content: Thought content normal.        Judgment: Judgment normal.     ED Results / Procedures / Treatments   Labs (all labs ordered are listed, but only abnormal results are displayed) Labs Reviewed  COMPREHENSIVE METABOLIC PANEL - Abnormal; Notable for the following components:      Result Value   Calcium 8.8 (*)    All other components within normal limits  CBC WITH DIFFERENTIAL/PLATELET  TROPONIN I (HIGH SENSITIVITY)    EKG EKG Interpretation  Date/Time:  Monday March 07 2020 13:01:56 EST Ventricular Rate:  65 PR Interval:    QRS Duration: 102 QT Interval:  408 QTC Calculation: 425 R Axis:   6 Text Interpretation: Sinus rhythm Low voltage, precordial leads Consider anterior infarct No significant change since last tracing Confirmed by Theotis Burrow [phone]) on 03/07/2020 2:10:03 PM   Radiology DG Chest 2 View  Result Date: 03/07/2020 CLINICAL DATA:  Intermittent chest pain EXAM: CHEST - 2 VIEW COMPARISON:  None. FINDINGS: The heart size and mediastinal contours are within normal limits. Both lungs are clear. Disc degenerative disease of the thoracic spine. IMPRESSION: No  acute abnormality of the lungs. Electronically Signed   By: Eddie Candle M.D.   On: 03/07/2020 14:57    Procedures Procedures (including critical care time)  Medications Ordered in ED Medications  oxyCODONE-acetaminophen (PERCOCET/ROXICET) 5-325 MG per tablet 2 tablet (2 tablets Oral Given 03/07/20 1552)   Orthostatic VS for the past 24 hrs:  BP- Lying Pulse- Lying BP- Sitting Pulse- Sitting BP- Standing at 0 minutes Pulse- Standing at 0 minutes  03/07/20 1409 131/70 66 (!) 156/102 65 (!) 189/118 70     ED Course  I have reviewed the triage vital signs and the nursing notes.  Pertinent labs & imaging results that were available during my care of the patient were reviewed by me and considered in my medical decision making (see chart for details).    MDM Rules/Calculators/A&P                          Well-appearing on exam, normal neurologic exam.  Symptoms suggest lightheadedness and no description suggestive of vertigo.  Orthostatic vital signs showed increase in blood pressure with standing.  lab work here  reassuring including normal creatinine.  Troponin is negative and EKG without ischemic changes.  His chest pain is a few seconds in duration and sounds very atypical for ACS.  He has also been evaluated in the ED previously for the same pain and work-up at that time was also reassuring.  Chest x-ray is clear today.  I have recommended that he see PCP for further discussion of his blood pressure control and symptoms.  I extensively reviewed return precautions with him and he voiced understanding. Final Clinical Impression(s) / ED Diagnoses Final diagnoses:  Dizziness  Labile hypertension    Rx / DC Orders ED Discharge Orders    None       Ihsan Nomura, Wenda Overland, MD 03/07/20 1711

## 2020-03-07 NOTE — Telephone Encounter (Signed)
[facility] - Client TELEPHONE ADVICE RECORD [facility] Patient Name: [name] Gender: Male DOB: 04-30-1950 Age: 69 Y 21 D Return Phone Number: [phone] (Primary) Address: City/State/Zip: [place] [address] Client [facility] - Client Client Site [place] Physician Ria Bush - MD Contact Type Call Who Is Calling Patient / Member / Family / Caregiver Call Type Triage / Clinical Relationship To Patient Self Return Phone Number [phone] (Primary) Chief Complaint Dizziness Reason for Call Symptomatic / Request for [place] states she is dizzy all the time. Blood pressure has been 119/85 120/85 133/80, stmoach bothers her when eating and drinking Translation No Nurse Assessment Nurse: Mancel Bale, RN, Butch Penny Date/Time [name] Time): 03/07/2020 11:42:30 AM Confirm and document reason for call. If symptomatic, describe symptoms. ---Caller states he is having symptoms of dizziness and nausea. Symptoms started for several months. He has started changing some of his medications to help alleviate the symptoms but this is not helping. Does the patient have any new or worsening symptoms? ---Yes Will a triage be completed? ---Yes Related visit to physician within the last 2 weeks? ---No Does the PT have any chronic conditions? (i.e. diabetes, asthma, this includes High risk factors for pregnancy, etc.) ---Yes List chronic conditions. ---DM, HTN, Hypercholesterolemia Is this a behavioral health or substance abuse call? ---No Guidelines Guideline Title Affirmed Question Affirmed Notes Nurse Date/Time (Eastern Time) Chest Pain [1] Chest pain (or "angina") comes and goes AND [2] is happening more often (increasing in frequency) or getting worse (increasing in severity) (Exception: chest pains that last only a few seconds) Mancel Bale, RN, Butch Penny  03/07/2020 11:49:03 AM PLEASE NOTE: All timestamps contained within this report are represented as [place] Standard Time. CONFIDENTIALTY NOTICE: This fax transmission is intended only for the addressee. It contains information that is legally privileged, confidential or otherwise protected from use or disclosure. If you are not the intended recipient, you are strictly prohibited from reviewing, disclosing, copying using or disseminating any of this information or taking any action in reliance on or regarding this information. If you have received this fax in error, please notify us immediately by telephone so that we can arrange for its return to [place]. Phone: [phone], Toll-Free: [phone], Fax: [phone] Page: 2 of 2 Call Id: [id number] [employer]. Time [name] Time) Disposition Final User 03/07/2020 12:00:38 PM 911 Outcome Documentation Mancel Bale, RN, Butch Penny Reason: EMS has been dispatched 03/07/2020 11:54:53 AM Call EMS 911 Now Yes Mancel Bale, RN, Butch Penny Disposition Overriden: Go to ED Now Override Reason: Patient's symptoms need a higher level of care Caller Disagree/Comply Comply Caller Understands Yes PreDisposition Did not know what to do Care Advice Given Per Guideline GO TO ED NOW: * You need to be seen in the Emergency Department. * Go to the ED at ___________ [place] now. Drive carefully. ANOTHER ADULT SHOULD DRIVE: * It is better and safer if another adult drives instead of you. BRING MEDICINES: * Bring a list of your current medicines when you go to the Emergency Department (ER). * Bring the pill bottles too. This will help the doctor (or NP/PA) to make certain you are taking the right medicines and the right dose. NOTHING BY MOUTH: * Do not eat or drink anything for now. CALL EMS IF: * Severe difficulty breathing occurs * Passes out or becomes too weak to stand * You become worse CALL EMS 911 NOW: * Immediate medical attention is needed. You  need to hang up and call 911  (or an ambulance). * Triager Discretion: I'll call you back in a few minutes to be sure you were able to reach them. FIRST AID - LIE DOWN FOR SHOCK: * Lie down with feet elevated. * Reason: treatment for shock. Comments User: Marijo Conception, RN Date/Time [name] Time): 03/07/2020 11:55:56 AM During triage, caller stated he was also having intermittent symptoms of chest pain. Pain radiation into the L arm reported by the patient after dispo of going to ED so upgrade was made.

## 2020-03-07 NOTE — ED Notes (Signed)
Discharge paperwork reviewed with pt, pt with no questions or concerns at this time.  Ambulatory to ED entrance to meet ride.

## 2020-03-08 ENCOUNTER — Other Ambulatory Visit (HOSPITAL_COMMUNITY): Payer: Medicare Other

## 2020-03-08 ENCOUNTER — Encounter (HOSPITAL_COMMUNITY): Payer: Self-pay | Admitting: Cardiology

## 2020-03-08 ENCOUNTER — Encounter (HOSPITAL_COMMUNITY): Payer: Self-pay

## 2020-03-09 ENCOUNTER — Ambulatory Visit (INDEPENDENT_AMBULATORY_CARE_PROVIDER_SITE_OTHER): Payer: Medicare Other | Admitting: Family Medicine

## 2020-03-09 ENCOUNTER — Encounter: Payer: Self-pay | Admitting: Family Medicine

## 2020-03-09 ENCOUNTER — Other Ambulatory Visit: Payer: Self-pay

## 2020-03-09 VITALS — BP 126/80 | HR 88 | Temp 97.6°F | Ht 67.75 in | Wt 235.6 lb

## 2020-03-09 DIAGNOSIS — Z23 Encounter for immunization: Secondary | ICD-10-CM | POA: Diagnosis not present

## 2020-03-09 DIAGNOSIS — I10 Essential (primary) hypertension: Secondary | ICD-10-CM

## 2020-03-09 DIAGNOSIS — R42 Dizziness and giddiness: Secondary | ICD-10-CM | POA: Diagnosis not present

## 2020-03-09 DIAGNOSIS — E118 Type 2 diabetes mellitus with unspecified complications: Secondary | ICD-10-CM

## 2020-03-09 LAB — POCT GLYCOSYLATED HEMOGLOBIN (HGB A1C): Hemoglobin A1C: 7.4 % — AB (ref 4.0–5.6)

## 2020-03-09 NOTE — Progress Notes (Signed)
Patient ID: [name], male    DOB: Jun 12, 1950, 69 y.o.   MRN: [id number]  This visit was conducted in person.  BP 126/80 (BP Location: Right Arm, Cuff Size: Large)   Pulse 88   Temp 97.6 F (36.4 C) (Temporal)   Ht 5' 7.75" (1.721 m)   Wt 235 lb 9 oz (106.9 kg)   SpO2 97%   BMI 36.08 kg/m   Orthostatic VS for the past 24 hrs (Last 3 readings):  BP- Lying BP- Standing at 0 minutes  03/09/20 1412 -- 118/84  03/09/20 1410 132/82 --   CC: ER f/u visit  Subjective:   HPI: [name] is a 69 y.o. male presenting on 03/09/2020 for Hospitalization Follow-up (Seen at [facility] ED 03/07/20. )   Seen at ER on Monday for chronic dizziness for months, worse when standing. Also evaluated for posterior headache and intermittent L sided chest pressure sensation lasting seconds. Workup largely reassuring including TnI of 6 and CXR, but noted fluctuating blood pressures. No dyspnea. ER records reviewed.  HTN - Compliant with current antihypertensive regimen of carvedilol 3.125mg  bid, losartan 50mg  daily. He is also on jardiance 10mg  daily. Does check blood pressures at home: forgot BP log at home but Saturday's reading 119/80. + lightheadedness. Denies HA, vision changes, SOB, leg swelling.  Notes ongoing substernal into left side chest pressure, some radiation down left arm associated with stinging. Notes L calf pain and L dorsal foot heat, but not worse with walking.   Endorses feeling ill for the past 3 months. Intermittent nausea.  May have started after starting flomax 11/2019. He did not refill meloxicam or pyridium (through urology). Nocturia x3.  Flomax has helped urination.   Sugar readings largely well controlled (home log reviewed).  Lab Results  Component Value Date   HGBA1C 7.4 (A) 03/09/2020         Relevant past medical, surgical, family and social history reviewed and updated as indicated. Interim medical history since our last visit reviewed. Allergies and  medications reviewed and updated. Outpatient Medications Prior to Visit  Medication Sig Dispense Refill  . aspirin EC 81 MG tablet Take 81 mg by mouth daily.    Marland Kitchen atorvastatin (LIPITOR) 80 MG tablet TAKE 1 TABLET BY MOUTH  DAILY (Patient taking differently: Take 80 mg by mouth daily. ) 90 tablet 3  . baclofen (LIORESAL) 10 MG tablet TAKE 1 TABLET BY MOUTH AT  BEDTIME AS NEEDED FOR  MUSCLE SPASM(S) (Patient taking differently: Take 10 mg by mouth at bedtime as needed for muscle spasms. ) 90 tablet 0  . carvedilol (COREG) 3.125 MG tablet TAKE 1 TABLET BY MOUTH  TWICE DAILY WITH A MEAL (Patient taking differently: Take 3.125 mg by mouth 2 (two) times daily with a meal. ) 180 tablet 3  . empagliflozin (JARDIANCE) 10 MG TABS tablet Take 10 mg by mouth daily. 90 tablet 3  . fluticasone (FLONASE) 50 MCG/ACT nasal spray USE 2 SPRAYS IN BOTH  NOSTRILS DAILY 48 g 3  . gabapentin (NEURONTIN) 100 MG capsule TAKE 1 CAPSULE BY MOUTH TWO TIMES DAILY (Patient taking differently: Take 100 mg by mouth 2 (two) times daily. ) 180 capsule 3  . glipiZIDE (GLUCOTROL XL) 5 MG 24 hr tablet TAKE 1 TABLET BY MOUTH  DAILY WITH BREAKFAST (Patient taking differently: Take 5 mg by mouth daily with breakfast. ) 90 tablet 3  . glucose blood (ONETOUCH ULTRA) test strip USE WITH METER TO CHECK  BLOOD SUGAR  TWICE DAILY 200 strip 3  . guaiFENesin (MUCINEX) 600 MG 12 hr tablet Take 600 mg by mouth 2 (two) times daily as needed for cough.    . losartan (COZAAR) 50 MG tablet TAKE 1 TABLET BY MOUTH  DAILY (Patient taking differently: Take 50 mg by mouth daily. ) 90 tablet 2  . metFORMIN (GLUCOPHAGE) 1000 MG tablet TAKE 1 TABLET BY MOUTH  TWICE DAILY WITH A MEAL (Patient taking differently: Take 1,000 mg by mouth 2 (two) times daily with a meal. ) 180 tablet 2  . omeprazole (PRILOSEC) 40 MG capsule TAKE 1 CAPSULE(40 MG) BY MOUTH DAILY (Patient taking differently: Take 40 mg by mouth daily. ) 90 capsule 3  . oxyCODONE-acetaminophen (PERCOCET)  10-325 MG tablet Take 1 tablet by mouth every 4 (four) hours as needed for pain. (Patient taking differently: Take 1 tablet by mouth every 4 (four) hours. ) 10 tablet 0  . polyvinyl alcohol (LIQUIFILM TEARS) 1.4 % ophthalmic solution Place 1 drop into both eyes as needed for dry eyes.    Marland Kitchen FLOMAX 0.4 MG CAPS capsule Take 0.4 mg by mouth daily.     No facility-administered medications prior to visit.     Per HPI unless specifically indicated in ROS section below Review of Systems Objective:  BP 126/80 (BP Location: Right Arm, Cuff Size: Large)   Pulse 88   Temp 97.6 F (36.4 C) (Temporal)   Ht 5' 7.75" (1.721 m)   Wt 235 lb 9 oz (106.9 kg)   SpO2 97%   BMI 36.08 kg/m   Wt Readings from Last 3 Encounters:  03/09/20 235 lb 9 oz (106.9 kg)  11/03/19 239 lb 1 oz (108.4 kg)  09/10/19 238 lb (108 kg)      Physical Exam Vitals and nursing note reviewed.  Constitutional:      Appearance: Normal appearance. He is obese. He is not ill-appearing.  Cardiovascular:     Rate and Rhythm: Normal rate and regular rhythm.     Pulses: Normal pulses.     Heart sounds: Normal heart sounds. No murmur heard.   Pulmonary:     Effort: Pulmonary effort is normal. No respiratory distress.     Breath sounds: Normal breath sounds. No wheezing, rhonchi or rales.  Musculoskeletal:     Right lower leg: No edema.     Left lower leg: No edema.  Neurological:     Mental Status: He is alert.  Psychiatric:        Mood and Affect: Mood normal.        Behavior: Behavior normal.       Results for orders placed or performed in visit on 03/09/20  POCT glycosylated hemoglobin (Hb A1C)  Result Value Ref Range   Hemoglobin A1C 7.4 (A) 4.0 - 5.6 %   HbA1c POC (<> result, manual entry)     HbA1c, POC (prediabetic range)     HbA1c, POC (controlled diabetic range)     Assessment & Plan:  This visit occurred during the SARS-CoV-2 public health emergency.  Safety protocols were in place, including screening  questions prior to the visit, additional usage of staff PPE, and extensive cleaning of exam room while observing appropriate contact time as indicated for disinfecting solutions.   Problem List Items Addressed This Visit    Lightheadedness - Primary    Intermittent orthostatic dizziness worse since flomax started 11/2019 - stop and reassess. If symptoms resolve, likely culprit - to discuss with uro other options. Discussed if  restarted he should take at night time.  He did not continue taking meloxicam or pyridium.       Essential hypertension    Fluctuating readings noted recently.  Stop flomax, reassess.       Controlled diabetes mellitus type 2 with complications (HCC)    Update A1c on jardiance 10mg  and glipizide and metformin.       Relevant Orders   POCT glycosylated hemoglobin (Hb A1C) (Completed)    Other Visit Diagnoses    Need for influenza vaccination       Relevant Orders   Flu Vaccine QUAD High Dose(Fluad) (Completed)       No orders of the defined types were placed in this encounter.  Orders Placed This Encounter  Procedures  . Flu Vaccine QUAD High Dose(Fluad)  . POCT glycosylated hemoglobin (Hb A1C)    Patient Instructions  Flu shot today  A1c today.  Let's hold flomax for now - this could be associated with drops in blood pressure when standing. If fully better, likely the culprit. Would be reasonable to trial flomax at night time to see if better tolerated.   Follow up plan: Return if symptoms worsen or fail to improve.  Ria Bush, MD

## 2020-03-09 NOTE — Assessment & Plan Note (Signed)
Update A1c on jardiance 10mg  and glipizide and metformin.

## 2020-03-09 NOTE — Assessment & Plan Note (Addendum)
Intermittent orthostatic dizziness worse since flomax started 11/2019 - stop and reassess. If symptoms resolve, likely culprit - to discuss with uro other options. Discussed if restarted he should take at night time.  He did not continue taking meloxicam or pyridium.

## 2020-03-09 NOTE — Assessment & Plan Note (Signed)
Fluctuating readings noted recently.  Stop flomax, reassess.

## 2020-03-09 NOTE — Patient Instructions (Addendum)
Flu shot today  A1c today.  Let's hold flomax for now - this could be associated with drops in blood pressure when standing. If fully better, likely the culprit. Would be reasonable to trial flomax at night time to see if better tolerated.

## 2020-03-10 ENCOUNTER — Telehealth: Payer: Self-pay | Admitting: Family Medicine

## 2020-03-10 NOTE — Telephone Encounter (Signed)
Patient is requesting Higher toliet due to mobility. Patient is currently in apartment and they would need documentation to change it out. Please contact the patient at [phone]. EM

## 2020-03-11 NOTE — Telephone Encounter (Signed)
Spoke with pt asking for more details concerning request.   States his toilet sits low and causes back pain when trying to pull himself up.  Says he needs a letter from Dr. Darnell Level stating because of the back pain he needs a higher toilet to lessen the strain.

## 2020-03-14 ENCOUNTER — Inpatient Hospital Stay: Payer: Medicare Other | Admitting: Family Medicine

## 2020-03-14 NOTE — Telephone Encounter (Addendum)
Letter written and in Lisa's box.  

## 2020-03-14 NOTE — Telephone Encounter (Signed)
Spoke with pt notifying him the letter was ready.  Pt requests letter be mailed to him.  Mailed letter.

## 2020-03-17 ENCOUNTER — Other Ambulatory Visit: Payer: Self-pay | Admitting: Internal Medicine

## 2020-04-12 ENCOUNTER — Ambulatory Visit (HOSPITAL_COMMUNITY): Payer: Medicare Other | Attending: Cardiovascular Disease

## 2020-04-12 ENCOUNTER — Other Ambulatory Visit: Payer: Self-pay

## 2020-04-12 ENCOUNTER — Encounter (INDEPENDENT_AMBULATORY_CARE_PROVIDER_SITE_OTHER): Payer: Self-pay

## 2020-04-12 DIAGNOSIS — I10 Essential (primary) hypertension: Secondary | ICD-10-CM | POA: Insufficient documentation

## 2020-04-12 DIAGNOSIS — E785 Hyperlipidemia, unspecified: Secondary | ICD-10-CM | POA: Diagnosis not present

## 2020-04-12 DIAGNOSIS — I77810 Thoracic aortic ectasia: Secondary | ICD-10-CM | POA: Diagnosis not present

## 2020-04-12 DIAGNOSIS — I080 Rheumatic disorders of both mitral and aortic valves: Secondary | ICD-10-CM | POA: Insufficient documentation

## 2020-04-12 DIAGNOSIS — E119 Type 2 diabetes mellitus without complications: Secondary | ICD-10-CM | POA: Diagnosis not present

## 2020-04-12 DIAGNOSIS — I2510 Atherosclerotic heart disease of native coronary artery without angina pectoris: Secondary | ICD-10-CM | POA: Insufficient documentation

## 2020-04-12 DIAGNOSIS — I313 Pericardial effusion (noninflammatory): Secondary | ICD-10-CM

## 2020-04-12 DIAGNOSIS — Z8673 Personal history of transient ischemic attack (TIA), and cerebral infarction without residual deficits: Secondary | ICD-10-CM | POA: Insufficient documentation

## 2020-04-12 DIAGNOSIS — G4730 Sleep apnea, unspecified: Secondary | ICD-10-CM | POA: Diagnosis not present

## 2020-04-12 LAB — ECHOCARDIOGRAM COMPLETE
Area-P 1/2: 1.78 cm2
P 1/2 time: 691 msec
S' Lateral: 3 cm

## 2020-04-13 ENCOUNTER — Encounter: Payer: Self-pay | Admitting: Cardiology

## 2020-04-13 DIAGNOSIS — I77819 Aortic ectasia, unspecified site: Secondary | ICD-10-CM | POA: Insufficient documentation

## 2020-04-19 ENCOUNTER — Telehealth: Payer: Self-pay | Admitting: Family Medicine

## 2020-04-19 NOTE — Chronic Care Management (AMB) (Signed)
  Chronic Care Management   Note  04/19/2020 Name: [name] MRN: [id number] DOB: 06-13-1950  [name] is a 69 y.o. year old male who is a primary care patient of Eustaquio Boyden, MD. I reached out to [employer] by phone today in response to a referral sent by [name] PCP, Eustaquio Boyden, MD.   [name] was given information about Chronic Care Management services today including:  1. CCM service includes personalized support from designated clinical staff supervised by his physician, including individualized plan of care and coordination with other care providers 2. 24/7 contact phone numbers for assistance for urgent and routine care needs. 3. Service will only be billed when office clinical staff spend 20 minutes or more in a month to coordinate care. 4. Only one practitioner may furnish and bill the service in a calendar month. 5. The patient may stop CCM services at any time (effective at the end of the month) by phone call to the office staff.   Patient agreed to services and verbal consent obtained.   Follow up plan:   Aggie Hacker  [employer] Scheduler

## 2020-04-29 ENCOUNTER — Other Ambulatory Visit: Payer: Self-pay

## 2020-04-29 ENCOUNTER — Ambulatory Visit: Payer: Medicare Other | Admitting: Podiatry

## 2020-04-29 DIAGNOSIS — L853 Xerosis cutis: Secondary | ICD-10-CM

## 2020-04-29 DIAGNOSIS — M79674 Pain in right toe(s): Secondary | ICD-10-CM

## 2020-04-29 DIAGNOSIS — M79675 Pain in left toe(s): Secondary | ICD-10-CM | POA: Diagnosis not present

## 2020-04-29 DIAGNOSIS — B351 Tinea unguium: Secondary | ICD-10-CM | POA: Diagnosis not present

## 2020-04-29 DIAGNOSIS — E1142 Type 2 diabetes mellitus with diabetic polyneuropathy: Secondary | ICD-10-CM

## 2020-05-03 ENCOUNTER — Encounter: Payer: Self-pay | Admitting: Podiatry

## 2020-05-03 NOTE — Progress Notes (Signed)
  Subjective:  Patient ID: BLAYDE BACIGALUPI, male    DOB: 1951-01-30,  MRN: [id number]  Chief Complaint  Patient presents with  . routine foot care    Nail trim    70 y.o. male returns for the above complaint.  Patient presents with thickened elongated dystrophic toenails x10.  Patient states is painful to touch.  Patient would like to have them debrided down.  He has secondary complaint of dry skin.  He states has been going on for quite some time.  Does not moisturize.  He would like to discuss treatment options for it including prescription if needed.  He denies any other acute complaints  Objective:  There were no vitals filed for this visit. Podiatric Exam: Vascular: dorsalis pedis and posterior tibial pulses are palpable bilateral. Capillary return is immediate. Temperature gradient is WNL. Skin turgor WNL  Sensorium: Normal Semmes Weinstein monofilament test. Normal tactile sensation bilaterally. Nail Exam: Pt has thick disfigured discolored nails with subungual debris noted bilateral entire nail hallux through fifth toenails.  Pain on palpation to the nails. Ulcer Exam: There is no evidence of ulcer or pre-ulcerative changes or infection. Orthopedic Exam: Muscle tone and strength are WNL. No limitations in general ROM. No crepitus or effusions noted. HAV  B/L.  Hammer toes 2-5  B/L. Skin: No Porokeratosis. No infection or ulcers.  Moderate xerosis noted to bilateral lower extremities/dry skin.  No fissuring noted no clinical signs of infection noted    Assessment & Plan:   1. Diabetic polyneuropathy associated with type 2 diabetes mellitus (HCC)   2. Pain due to onychomycosis of toenails of both feet   3. Xerosis cutis     Patient was evaluated and treated and all questions answered.  Xerosis bilateral lower extremity -I explained to the patient the etiology of xerosis and various treatment options were extensively discussed.  I explained to the patient the importance of  maintaining moisturization of the skin with application of over-the-counter lotion such as Eucerin or Luciderm.  I have asked the patient to apply this twice a day.  If unable to resolve patient will benefit from prescription lotion.  Onychomycosis with pain  -Nails palliatively debrided as below. -Educated on self-care  Procedure: Nail Debridement Rationale: pain  Type of Debridement: manual, sharp debridement. Instrumentation: Nail nipper, rotary burr. Number of Nails: 10  Procedures and Treatment: Consent by patient was obtained for treatment procedures. The patient understood the discussion of treatment and procedures well. All questions were answered thoroughly reviewed. Debridement of mycotic and hypertrophic toenails, 1 through 5 bilateral and clearing of subungual debris. No ulceration, no infection noted.  Return Visit-Office Procedure: Patient instructed to return to the office for a follow up visit 3 months for continued evaluation and treatment.  [name], DPM    No follow-ups on file.

## 2020-05-05 ENCOUNTER — Telehealth: Payer: Self-pay | Admitting: Family Medicine

## 2020-05-05 NOTE — Telephone Encounter (Signed)
Pt called in wanted to know if [name] can give him a call, he did not go into detail

## 2020-05-05 NOTE — Telephone Encounter (Signed)
Returned pt's call.  He wanted to make [place] aware we should receive a fax for pt assistance for his Jardiance.  Pt asks that we complete it and send it in.  Told him we will take care of it as soon as we get the form.  Pt satisfied.

## 2020-05-06 ENCOUNTER — Telehealth: Payer: Self-pay

## 2020-05-06 NOTE — Telephone Encounter (Signed)
Received faxed form from [employer] for pt assistance.  Placed form in Dr. Synthia Innocent box.   Pt will bring personal info at [place] on 05/10/20 to complete his portion of the form.

## 2020-05-10 ENCOUNTER — Ambulatory Visit: Payer: Medicare Other | Admitting: Family Medicine

## 2020-05-13 ENCOUNTER — Other Ambulatory Visit: Payer: Self-pay

## 2020-05-13 ENCOUNTER — Ambulatory Visit (INDEPENDENT_AMBULATORY_CARE_PROVIDER_SITE_OTHER): Payer: Medicare Other | Admitting: Family Medicine

## 2020-05-13 ENCOUNTER — Ambulatory Visit: Payer: Medicare Other | Admitting: Family Medicine

## 2020-05-13 ENCOUNTER — Encounter: Payer: Self-pay | Admitting: Family Medicine

## 2020-05-13 VITALS — BP 124/82 | HR 60 | Temp 97.5°F | Ht 67.75 in | Wt 241.2 lb

## 2020-05-13 DIAGNOSIS — E1142 Type 2 diabetes mellitus with diabetic polyneuropathy: Secondary | ICD-10-CM | POA: Diagnosis not present

## 2020-05-13 DIAGNOSIS — Z6836 Body mass index (BMI) 36.0-36.9, adult: Secondary | ICD-10-CM | POA: Diagnosis not present

## 2020-05-13 DIAGNOSIS — I77819 Aortic ectasia, unspecified site: Secondary | ICD-10-CM | POA: Diagnosis not present

## 2020-05-13 DIAGNOSIS — I2510 Atherosclerotic heart disease of native coronary artery without angina pectoris: Secondary | ICD-10-CM | POA: Diagnosis not present

## 2020-05-13 DIAGNOSIS — IMO0002 Reserved for concepts with insufficient information to code with codable children: Secondary | ICD-10-CM

## 2020-05-13 DIAGNOSIS — E1165 Type 2 diabetes mellitus with hyperglycemia: Secondary | ICD-10-CM

## 2020-05-13 LAB — POCT GLYCOSYLATED HEMOGLOBIN (HGB A1C): Hemoglobin A1C: 7.5 % — AB (ref 4.0–5.6)

## 2020-05-13 MED ORDER — METFORMIN HCL 1000 MG PO TABS
1000.0000 mg | ORAL_TABLET | Freq: Every day | ORAL | 3 refills | Status: DC
Start: 2020-05-13 — End: 2020-07-15

## 2020-05-13 MED ORDER — BACLOFEN 10 MG PO TABS: 10.0000 mg | ORAL_TABLET | Freq: Every evening | ORAL | 0 refills | Status: DC | PRN

## 2020-05-13 NOTE — Patient Instructions (Addendum)
Eye appointment next month.  Bring [place] your info to complete patient assistance program.  You are doing well today - continue current medicines as on medication list today.  Continue good water intake.  Return as needed or in 6 months for physical.

## 2020-05-13 NOTE — Assessment & Plan Note (Addendum)
Overall stable period, however A1c remains above ideal control. He's only been taking metformin once daily due to lows otherwise. Discussed increasing jardiance to 25mg  dose - he prefers to stay  On current regimen.  Discussed diabetic diet, portion sizes.  Foot exam through podiatry. Upcoming eye exam. Reassess at 6 mo CPE/AMW.

## 2020-05-13 NOTE — Assessment & Plan Note (Signed)
Discussed weight gain noted.  

## 2020-05-13 NOTE — Assessment & Plan Note (Signed)
Stable period on 100-200mg  gabapentin at night time.

## 2020-05-13 NOTE — Assessment & Plan Note (Signed)
Recently noted. Seeing cards.

## 2020-05-13 NOTE — Assessment & Plan Note (Signed)
Appreciate cards care. 

## 2020-05-13 NOTE — Progress Notes (Signed)
Patient ID: [name], male    DOB: 1950/10/20, 70 y.o.   MRN: [id number]  This visit was conducted in person.  BP 124/82 (BP Location: Left Arm, Patient Position: Sitting, Cuff Size: Large)   Pulse 60   Temp (!) 97.5 F (36.4 C) (Temporal)   Ht 5' 7.75" (1.721 m)   Wt 241 lb 4 oz (109.4 kg)   SpO2 98%   BMI 36.95 kg/m    CC: 6 mo DM f/u visit  Subjective:   HPI: [name] is a 70 y.o. male presenting on 05/13/2020 for Diabetes (Here for 6 mo f/u.)   PAP through [place] - for [employer]. Forgot personal info to complete form.   Foot exam through podiatry, last seen 04/29/2020. Started using mosturizing cream BID with benefit (doesn't remember name).   DM - does regularly check sugars: fasting 130s, PM readings 120s. Did not bring log. Compliant with antihyperglycemic regimen which includes: jardiance 10mg  daily (ran out a few days ago), glipizide XL 5mg  daily, metformin 1000mg  - but only taking once daily. Takes jardiance and gabapentin at night with benefit. Denies UTI symptoms, yeast infection or groin infection symptoms. Cutting down on portion sizes, cutting down on sugars and carbs. Denies low sugars or hypoglycemic symptoms. Denies paresthesias or burning nerve pain. Last diabetic eye exam DUE (04/2019). Pneumovax: 2021. Prevnar: 2017. Glucometer brand: OneTouch Ultra. DSME: has not completed. Lab Results  Component Value Date   HGBA1C 7.5 (A) 05/13/2020   Diabetic Foot Exam - Simple   No data filed    Lab Results  Component Value Date   MICROALBUR 3.2 (H) 06/16/2012         Relevant past medical, surgical, family and social history reviewed and updated as indicated. Interim medical history since our last visit reviewed. Allergies and medications reviewed and updated. Outpatient Medications Prior to Visit  Medication Sig Dispense Refill  . aspirin EC 81 MG tablet Take 81 mg by mouth daily.    Marland Kitchen atorvastatin (LIPITOR) 80  MG tablet TAKE 1 TABLET BY MOUTH  DAILY (Patient taking differently: Take 80 mg by mouth daily.) 90 tablet 3  . carvedilol (COREG) 3.125 MG tablet TAKE 1 TABLET BY MOUTH  TWICE DAILY WITH A MEAL (Patient taking differently: Take 3.125 mg by mouth 2 (two) times daily with a meal.) 180 tablet 3  . empagliflozin (JARDIANCE) 10 MG TABS tablet Take 10 mg by mouth daily. 90 tablet 3  . fluticasone (FLONASE) 50 MCG/ACT nasal spray USE 2 SPRAYS IN BOTH  NOSTRILS DAILY 48 g 3  . gabapentin (NEURONTIN) 100 MG capsule TAKE 1 CAPSULE BY MOUTH TWO TIMES DAILY (Patient taking differently: Take 100 mg by mouth 2 (two) times daily.) 180 capsule 3  . glipiZIDE (GLUCOTROL XL) 5 MG 24 hr tablet TAKE 1 TABLET BY MOUTH  DAILY WITH BREAKFAST (Patient taking differently: Take 5 mg by mouth daily with breakfast.) 90 tablet 3  . glucose blood (ONETOUCH ULTRA) test strip USE WITH METER TO CHECK  BLOOD SUGAR TWICE DAILY 200 strip 3  . guaiFENesin (MUCINEX) 600 MG 12 hr tablet Take 600 mg by mouth 2 (two) times daily as needed for cough.    . losartan (COZAAR) 50 MG tablet TAKE 1 TABLET BY MOUTH  DAILY (Patient taking differently: Take 50 mg by mouth daily.) 90 tablet 2  . omeprazole (PRILOSEC) 40 MG capsule TAKE 1 CAPSULE(40 MG) BY MOUTH DAILY 90 capsule 3  . oxyCODONE-acetaminophen (PERCOCET)  10-325 MG tablet Take 1 tablet by mouth every 4 (four) hours as needed for pain. (Patient taking differently: Take 1 tablet by mouth every 4 (four) hours.) 10 tablet 0  . polyvinyl alcohol (LIQUIFILM TEARS) 1.4 % ophthalmic solution Place 1 drop into both eyes as needed for dry eyes.    . baclofen (LIORESAL) 10 MG tablet TAKE 1 TABLET BY MOUTH AT  BEDTIME AS NEEDED FOR  MUSCLE SPASM(S) (Patient taking differently: Take 10 mg by mouth at bedtime as needed for muscle spasms.) 90 tablet 0  . metFORMIN (GLUCOPHAGE) 1000 MG tablet TAKE 1 TABLET BY MOUTH  TWICE DAILY WITH A MEAL (Patient taking differently: Take 1,000 mg by mouth 2 (two) times  daily with a meal.) 180 tablet 2   No facility-administered medications prior to visit.     Per HPI unless specifically indicated in ROS section below Review of Systems Objective:  BP 124/82 (BP Location: Left Arm, Patient Position: Sitting, Cuff Size: Large)   Pulse 60   Temp (!) 97.5 F (36.4 C) (Temporal)   Ht 5' 7.75" (1.721 m)   Wt 241 lb 4 oz (109.4 kg)   SpO2 98%   BMI 36.95 kg/m   Wt Readings from Last 3 Encounters:  05/13/20 241 lb 4 oz (109.4 kg)  03/09/20 235 lb 9 oz (106.9 kg)  11/03/19 239 lb 1 oz (108.4 kg)      Physical Exam Vitals and nursing note reviewed.  Constitutional:      General: He is not in acute distress.    Appearance: Normal appearance. He is well-developed and well-nourished. He is obese. He is not ill-appearing.  HENT:     Mouth/Throat:     Mouth: Oropharynx is clear and moist.  Eyes:     General: No scleral icterus.    Extraocular Movements: Extraocular movements intact and EOM normal.     Conjunctiva/sclera: Conjunctivae normal.     Pupils: Pupils are equal, round, and reactive to light.  Cardiovascular:     Rate and Rhythm: Normal rate and regular rhythm.     Pulses: Normal pulses and intact distal pulses.     Heart sounds: Normal heart sounds. No murmur heard.   Pulmonary:     Effort: Pulmonary effort is normal. No respiratory distress.     Breath sounds: Normal breath sounds. No wheezing, rhonchi or rales.  Musculoskeletal:        General: No edema.     Cervical back: Normal range of motion and neck supple.     Right lower leg: No edema.     Left lower leg: No edema.     Comments: See HPI for foot exam if done  Lymphadenopathy:     Cervical: No cervical adenopathy.  Skin:    General: Skin is warm and dry.     Findings: No rash.  Neurological:     Mental Status: He is alert.  Psychiatric:        Mood and Affect: Mood and affect and mood normal.        Behavior: Behavior normal.       Results for orders placed or  performed in visit on 05/13/20  POCT glycosylated hemoglobin (Hb A1C)  Result Value Ref Range   Hemoglobin A1C 7.5 (A) 4.0 - 5.6 %   HbA1c POC (<> result, manual entry)     HbA1c, POC (prediabetic range)     HbA1c, POC (controlled diabetic range)     Assessment & Plan:  This  visit occurred during the SARS-CoV-2 public health emergency.  Safety protocols were in place, including screening questions prior to the visit, additional usage of staff PPE, and extensive cleaning of exam room while observing appropriate contact time as indicated for disinfecting solutions.   Problem List Items Addressed This Visit    Severe obesity (BMI 35.0-39.9) with comorbidity ([place])    Discussed weight gain noted.       Relevant Medications   metFORMIN (GLUCOPHAGE) 1000 MG tablet   Diabetic neuropathy (HCC)    Stable period on 100-200mg  gabapentin at night time.       Relevant Medications   metFORMIN (GLUCOPHAGE) 1000 MG tablet   Diabetes mellitus type 2, uncontrolled, with complications ([place]) - Primary    Overall stable period, however A1c remains above ideal control. He's only been taking metformin once daily due to lows otherwise. Discussed increasing jardiance to 25mg  dose - he prefers to stay  On current regimen.  Discussed diabetic diet, portion sizes.  Foot exam through podiatry. Upcoming eye exam. Reassess at 6 mo CPE/AMW.       Relevant Medications   metFORMIN (GLUCOPHAGE) 1000 MG tablet   Other Relevant Orders   POCT glycosylated hemoglobin (Hb A1C) (Completed)   CAD (coronary artery disease)    Appreciate cards care.       Acquired dilation of ascending aorta and aortic root (HCC)    Recently noted. Seeing cards.           Meds ordered this encounter  Medications  . metFORMIN (GLUCOPHAGE) 1000 MG tablet    Sig: Take 1 tablet (1,000 mg total) by mouth daily with breakfast.    Dispense:  90 tablet    Refill:  3    Note new sig  . baclofen (LIORESAL) 10 MG tablet    Sig: Take 1  tablet (10 mg total) by mouth at bedtime as needed for muscle spasms.    Dispense:  90 tablet    Refill:  0   Orders Placed This Encounter  Procedures  . POCT glycosylated hemoglobin (Hb A1C)    Patient Instructions  Eye appointment next month.  Bring [place] your info to complete patient assistance program.  You are doing well today - continue current medicines as on medication list today.  Continue good water intake.  Return as needed or in 6 months for physical.   Follow up plan: Return in about 6 months (around 11/10/2020), or if symptoms worsen or fail to improve, for annual exam, prior fasting for blood work.  Ria Bush, MD

## 2020-05-16 ENCOUNTER — Telehealth: Payer: Self-pay | Admitting: Family Medicine

## 2020-05-16 MED ORDER — EMPAGLIFLOZIN 10 MG PO TABS: 10.0000 mg | ORAL_TABLET | Freq: Every day | ORAL | 0 refills | Status: DC

## 2020-05-16 NOTE — Telephone Encounter (Signed)
Requests short supply of SGLT2 sent to local pharmacy while we await PAP response.  Please phone in if Eprescribing failure.

## 2020-05-16 NOTE — Telephone Encounter (Signed)
Pt brought in income info.  We completed the form.  Faxed form.  Decision pending.

## 2020-05-16 NOTE — Telephone Encounter (Signed)
Refill left on vm at pharmacy.  

## 2020-05-20 NOTE — Telephone Encounter (Addendum)
Received faxed PAP approval; valid 05/20/2020- 04/22/2021.    Spoke with pt to see if he was informed.  Says he had not heard anything but will contact them to get shipment sent.  Expresses his thanks for the call.

## 2020-05-23 ENCOUNTER — Ambulatory Visit: Payer: Medicare Other | Admitting: Family Medicine

## 2020-05-24 ENCOUNTER — Other Ambulatory Visit: Payer: Self-pay

## 2020-05-24 ENCOUNTER — Other Ambulatory Visit: Payer: Medicare Other

## 2020-05-24 DIAGNOSIS — Z20822 Contact with and (suspected) exposure to covid-19: Secondary | ICD-10-CM | POA: Diagnosis not present

## 2020-05-25 LAB — SARS-COV-2, NAA 2 DAY TAT

## 2020-05-25 LAB — NOVEL CORONAVIRUS, NAA: SARS-CoV-2, NAA: NOT DETECTED

## 2020-05-26 ENCOUNTER — Telehealth: Payer: Self-pay | Admitting: General Practice

## 2020-05-26 NOTE — Telephone Encounter (Signed)
Patient called to get COVID results. Made him aware they were negative. 

## 2020-06-01 ENCOUNTER — Telehealth: Payer: Self-pay | Admitting: Family Medicine

## 2020-06-01 ENCOUNTER — Other Ambulatory Visit: Payer: Self-pay

## 2020-06-01 MED ORDER — EMPAGLIFLOZIN 10 MG PO TABS: 10.0000 mg | ORAL_TABLET | Freq: Every day | ORAL | 0 refills | Status: DC

## 2020-06-01 NOTE — Telephone Encounter (Signed)
Pt called in wanted to know about getting a phone call about a certain medication

## 2020-06-02 ENCOUNTER — Other Ambulatory Visit: Payer: Self-pay | Admitting: Family Medicine

## 2020-06-02 NOTE — Telephone Encounter (Signed)
Returned pt's call about meds.  States he got call from [facility] saying they could help him with Jardiance, at low or no cost.  They just need rx sent.  I informed pt rx was sent in yesterday.  Pt verbalizes understanding and expresses his thanks.

## 2020-06-03 ENCOUNTER — Telehealth: Payer: Self-pay

## 2020-06-03 NOTE — Chronic Care Management (AMB) (Addendum)
Chronic Care Management Pharmacy Assistant   Name: [name]  MRN: [id number] DOB: 11-Dec-1950  Reason for Encounter: Initial questions for CCM visit scheduled 06/07/20  PCP : Ria Bush, MD  Allergies:   Allergies  Allergen Reactions   Glimepiride Other (See Comments)    Headache, back pain   Menthol (Topical Analgesic) Rash    Burning rash    Medications: Outpatient Encounter Medications as of 06/03/2020  Medication Sig   aspirin EC 81 MG tablet Take 81 mg by mouth daily.   atorvastatin (LIPITOR) 80 MG tablet TAKE 1 TABLET BY MOUTH  DAILY (Patient taking differently: Take 80 mg by mouth daily.)   baclofen (LIORESAL) 10 MG tablet Take 1 tablet (10 mg total) by mouth at bedtime as needed for muscle spasms.   carvedilol (COREG) 3.125 MG tablet TAKE 1 TABLET BY MOUTH  TWICE DAILY WITH A MEAL (Patient taking differently: Take 3.125 mg by mouth 2 (two) times daily with a meal.)   empagliflozin (JARDIANCE) 10 MG TABS tablet Take 1 tablet (10 mg total) by mouth daily before breakfast.   empagliflozin (JARDIANCE) 10 MG TABS tablet Take 1 tablet (10 mg total) by mouth daily.   fluticasone (FLONASE) 50 MCG/ACT nasal spray USE 2 SPRAYS IN BOTH  NOSTRILS DAILY   gabapentin (NEURONTIN) 100 MG capsule TAKE 1 CAPSULE BY MOUTH TWO TIMES DAILY (Patient taking differently: Take 100 mg by mouth 2 (two) times daily.)   glipiZIDE (GLUCOTROL XL) 5 MG 24 hr tablet TAKE 1 TABLET BY MOUTH  DAILY WITH BREAKFAST (Patient taking differently: Take 5 mg by mouth daily with breakfast.)   glucose blood (ONETOUCH ULTRA) test strip USE WITH METER TO CHECK  BLOOD SUGAR TWICE DAILY   guaiFENesin (MUCINEX) 600 MG 12 hr tablet Take 600 mg by mouth 2 (two) times daily as needed for cough.   losartan (COZAAR) 50 MG tablet TAKE 1 TABLET BY MOUTH  DAILY (Patient taking differently: Take 50 mg by mouth daily.)   metFORMIN (GLUCOPHAGE) 1000 MG tablet Take 1 tablet (1,000 mg total) by mouth daily with  breakfast.   omeprazole (PRILOSEC) 40 MG capsule TAKE 1 CAPSULE(40 MG) BY MOUTH DAILY   oxyCODONE-acetaminophen (PERCOCET) 10-325 MG tablet Take 1 tablet by mouth every 4 (four) hours as needed for pain. (Patient taking differently: Take 1 tablet by mouth every 4 (four) hours.)   polyvinyl alcohol (LIQUIFILM TEARS) 1.4 % ophthalmic solution Place 1 drop into both eyes as needed for dry eyes.   No facility-administered encounter medications on file as of 06/03/2020.    Current Diagnosis: Patient Active Problem List   Diagnosis Date Noted   Acquired dilation of ascending aorta and aortic root (HCC)    NSAID-induced gastric ulcer 01/07/2019   Pain due to onychomycosis of toenails of both feet 10/15/2018   CAD (coronary artery disease) 09/03/2018   Abnormal stress test    Chest pain 08/25/2018   Tongue lesion 08/05/2018   Fatty liver 07/31/2018   Lightheadedness 08/06/2017   Erectile dysfunction 08/01/2015   Health maintenance examination 01/31/2015   Gross hematuria    Lipoma of neck 10/01/2014   Allergic rhinitis 10/01/2014   Diabetic neuropathy ([place]) 05/31/2014   Advanced care planning/counseling discussion 01/26/2014   OSA (obstructive sleep apnea)    Tinea cruris 10/08/2012   Malignant neoplasm of prostate ([place]) 06/16/2012   Severe obesity (BMI 35.0-39.9) with comorbidity ([place]) 01/08/2012   Esophageal dysphagia 10/10/2011   Diverticulosis 10/09/2011   Male sexual dysfunction  09/11/2011   Insomnia 06/19/2011   Shoulder pain 04/08/2011   Neck pain, chronic 02/24/2011   Medicare annual wellness visit, subsequent 10/26/2010   Iron deficiency anemia 07/01/2009   GERD 09/29/2008   Essential hypertension 01/01/2008   ANXIETY DEPRESSION 03/19/2007   TIA (transient ischemic attack) 03/05/2007   Back pain with radiculopathy 12/31/2006   ABDOMINAL WALL HERNIA 10/03/2006   Diabetes mellitus type 2, uncontrolled, with complications ([place]) 42/35/3614   History of cocaine use 10/01/2006    Hyperlipidemia associated with type 2 diabetes mellitus ([place]) 08/21/2005     Have you seen any other providers since your last visit with PCP? No  Any changes in your medications or health? No  Any side effects from any medications? No  Do you have an symptoms or problems not managed by your medications? Yes Left foot between 4th and 5th toe sore and had spots on it. Using cream on the toes seems to help  Any concerns about your health right now? Yes- numbness in arm and thighs. History of neck and back surgery.   Has your provider asked that you check blood pressure, blood sugar, or follow special diet at home? Yes- states he checks his blood pressure and blood sugar daily.  Do you get any type of exercise on a regular basis? No formal exercise but states he is very active. States he is always in the kitchen trying to cook up new recipes or outside messing with his car. 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  Can you think of a goal you would like to reach for your health? Yes  Do you have any problems getting your medications? No Patient's preferred pharmacy is:  [facility] [place], [place] - [address] AT [address] [place] [address] Phone: [phone] Fax: [phone]  [place], [place] [address], [address] [place], [address] [place] [address] Phone: [phone] Fax: [phone]   Is there anything that you would like to discuss during the appointment? Yes- numbness in arm and thighs.   Lawerance Bach Wojnarowski was reminded to have all medications, supplements and any blood glucose and blood pressure readings available for review with [name], Pharm. D, at his telephone visit on 06/07/20 at 8:30 AM .    Follow-Up:  Pharmacist Review  [name], CPP notified  Margaretmary Dys, [place] Assistant [phone]  I have reviewed the  care management and care coordination activities outlined in this encounter and I am certifying that I agree with the content of this note. No further action required.  [name], PharmD Clinical Pharmacist [facility] at [facility] [phone]

## 2020-06-07 ENCOUNTER — Ambulatory Visit (INDEPENDENT_AMBULATORY_CARE_PROVIDER_SITE_OTHER): Payer: Medicare Other

## 2020-06-07 ENCOUNTER — Other Ambulatory Visit: Payer: Self-pay

## 2020-06-07 ENCOUNTER — Telehealth: Payer: Self-pay

## 2020-06-07 DIAGNOSIS — E1165 Type 2 diabetes mellitus with hyperglycemia: Secondary | ICD-10-CM

## 2020-06-07 DIAGNOSIS — IMO0002 Reserved for concepts with insufficient information to code with codable children: Secondary | ICD-10-CM

## 2020-06-07 DIAGNOSIS — E785 Hyperlipidemia, unspecified: Secondary | ICD-10-CM

## 2020-06-07 DIAGNOSIS — I10 Essential (primary) hypertension: Secondary | ICD-10-CM | POA: Diagnosis not present

## 2020-06-07 DIAGNOSIS — E1169 Type 2 diabetes mellitus with other specified complication: Secondary | ICD-10-CM

## 2020-06-07 DIAGNOSIS — E118 Type 2 diabetes mellitus with unspecified complications: Secondary | ICD-10-CM

## 2020-06-07 MED ORDER — OMEPRAZOLE 40 MG PO CPDR: DELAYED_RELEASE_CAPSULE | ORAL | 1 refills | Status: DC

## 2020-06-07 NOTE — Progress Notes (Signed)
Chronic Care Management Pharmacy Note  06/07/2020 Name:  RAHSAAN [name] MRN:  [id number] DOB:  05-11-1950  Subjective: [name] is an 70 y.o. year old male who is a primary patient of Ria Bush, MD.  The CCM team was consulted for assistance with disease management and care coordination needs.    Engaged with patient by telephone for initial visit in response to provider referral for pharmacy case management and/or care coordination services.   Consent to Services:  The patient was given the following information about Chronic Care Management services today, agreed to services, and gave verbal consent: 1. CCM service includes personalized support from designated clinical staff supervised by the primary care provider, including individualized plan of care and coordination with other care providers 2. 24/7 contact phone numbers for assistance for urgent and routine care needs. 3. Service will only be billed when office clinical staff spend 20 minutes or more in a month to coordinate care. 4. Only one practitioner may furnish and bill the service in a calendar month. 5.The patient may stop CCM services at any time (effective at the end of the month) by phone call to the office staff. 6. The patient will be responsible for cost sharing (co-pay) of up to 20% of the service fee (after annual deductible is met). Patient agreed to services and consent obtained.  Patient Care Team: Ria Bush, MD as PCP - General (Family Medicine) Sueanne Margarita, MD as PCP - Cardiology (Cardiology) Tonia Ghent, MD (Family Medicine) Shirley Muscat Loreen Freud, MD as Referring Physician (Optometry) Netta Cedars, MD as Consulting Physician (Orthopedic Surgery) Melina Schools, MD as Consulting Physician (Orthopedic Surgery) Ardis Hughs, MD as Consulting Physician (Urology) Tyler Pita, MD as Consulting Physician (Radiation Oncology) Cira Rue, RN Nurse Navigator as Registered  Nurse ([place]) [name], [facility] as Pharmacist (Pharmacist)  Recent office visits:  05/13/20 - PCP - Diabetes - Overall stable period, however A1c remains above ideal control. He's only been taking metformin once daily due to lows otherwise. Discussed increasing jardiance to 2m dose - he prefers to stay  On current regimen. Discussed diabetic diet, portion sizes. Foot exam through podiatry. Upcoming eye exam.  03/09/20 - PCP - lightheadedness, Seen at ER on Monday for chronic dizziness for months, worse when standing. HTN - Compliant with current antihypertensive regimen of carvedilol 3.1230mbid, losartan 5020maily. He is also on jardiance 62m60mily. Let's hold flomax for now - this could be associated with drops in blood pressure when standing. If fully better, likely the culprit. Would be reasonable to trial flomax at night time to see if better tolerated.   11/03/19 - PCP - Continue current medications   [facility]:  [facility]  [facility] - 03/07/20 - Dizziness/labile hypertension, BP increase upon standing. Recommended see PCP for BP control/symptoms  ED - 12/13/19 - Cough/chest pain, negative Covid test, benzonatate PRN   Objective:  Lab Results  Component Value Date   CREATININE 0.93 03/07/2020   BUN 10 03/07/2020   GFR 97.28 10/30/2019   GFRNONAA >60 03/07/2020   GFRAA >60 12/13/2019   NA 140 03/07/2020   K 4.5 03/07/2020   CALCIUM 8.8 (L) 03/07/2020   CO2 26 03/07/2020    Lab Results  Component Value Date/Time   HGBA1C 7.5 (A) 05/13/2020 12:07 PM   HGBA1C 7.4 (A) 03/09/2020 02:48 PM   HGBA1C 7.3 (H) 10/30/2019 07:53 AM   HGBA1C 9.2 (H) 07/28/2018 11:41  AM   FRUCTOSAMINE 242 10/16/2018 11:25 AM   GFR 97.28 10/30/2019 07:53 AM   GFR 125.07 10/16/2018 11:25 AM   MICROALBUR 3.2 (H) 06/16/2012 09:23 AM   MICROALBUR 0.1 10/24/2010 08:01 AM    Last diabetic Eye exam:  Lab Results  Component Value Date/Time   HMDIABEYEEXA  No Retinopathy 06/03/2018 12:00 AM    Last diabetic Foot exam: 04/29/2020 podiatry  Lab Results  Component Value Date   CHOL 105 10/30/2019   HDL 30.70 (L) 10/30/2019   LDLCALC 61 10/30/2019   LDLDIRECT 104 (H) 12/26/2012   TRIG 67.0 10/30/2019   CHOLHDL 3 10/30/2019    Hepatic Function Latest Ref Rng & Units 03/07/2020 10/30/2019 06/30/2019  Total Protein 6.5 - 8.1 g/dL 7.5 7.1 7.1  Albumin 3.5 - 5.0 g/dL 4.0 4.3 3.8  AST 15 - 41 U/L 18 12 15   ALT 0 - 44 U/L 21 21 23   Alk Phosphatase 38 - 126 U/L 47 52 54  Total Bilirubin 0.3 - 1.2 mg/dL 0.5 0.4 0.3  Bilirubin, Direct 0.0 - 0.3 mg/dL - - -    Lab Results  Component Value Date/Time   TSH 1.60 10/30/2019 07:53 AM   TSH 1.502 12/26/2012 02:43 PM    CBC Latest Ref Rng & Units 03/07/2020 12/13/2019 10/30/2019  WBC 4.0 - 10.5 K/uL 5.0 5.4 4.7  Hemoglobin 13.0 - 17.0 g/dL 13.3 13.6 13.6  Hematocrit 39.0 - 52.0 % 40.7 41.0 40.5  Platelets 150 - 400 K/uL 263 261 265.0    No results found for: VD25OH  Clinical ASCVD: Yes  The ASCVD Risk score Mikey Bussing DC Jr., et al., 2013) failed to calculate for the following reasons:   The valid total cholesterol range is 130 to 320 mg/dL    Depression screen [facility] 2/9 10/29/2019 10/16/2018 08/13/2018  Decreased Interest 0 0 0  Down, Depressed, Hopeless 0 0 0  PHQ - 2 Score 0 0 0  Altered sleeping 0 0 -  Tired, decreased energy 0 0 -  Change in appetite 0 0 -  Feeling bad or failure about yourself  0 0 -  Trouble concentrating 0 0 -  Moving slowly or fidgety/restless 0 0 -  Suicidal thoughts 0 0 -  PHQ-9 Score 0 0 -  Difficult doing work/chores Not difficult at all Not difficult at all -  Some recent data might be hidden    Social History   Tobacco Use  Smoking Status Never Smoker  Smokeless Tobacco Never Used   BP Readings from Last 3 Encounters:  05/13/20 124/82  03/09/20 126/80  03/07/20 140/85   Pulse Readings from Last 3 Encounters:  05/13/20 60  03/09/20 88  03/07/20 (!) 59   Wt  Readings from Last 3 Encounters:  05/13/20 241 lb 4 oz (109.4 kg)  03/09/20 235 lb 9 oz (106.9 kg)  11/03/19 239 lb 1 oz (108.4 kg)    Assessment/Interventions: Review of patient past medical history, allergies, medications, health status, including review of consultants reports, laboratory and other test data, was performed as part of comprehensive evaluation and provision of chronic care management services.   SDOH:  (Social Determinants of Health) assessments and interventions performed: Yes SDOH Interventions   Flowsheet Row Most Recent Value  SDOH Interventions   Financial Strain Interventions Other (Comment)  [Mail coupon for sildenafil]      CCM Care Plan  Allergies  Allergen Reactions  . Glimepiride Other (See Comments)    Headache, back pain  . Menthol (Topical  Analgesic) Rash    Burning rash    Medications Reviewed Today    Reviewed by [name], [facility] (Pharmacist) on 06/07/20 at [phone]  Med List Status: <None>  Medication Order Taking? Sig Documenting Provider Last Dose Status Informant  aspirin EC 81 MG tablet [id number] Yes Take 81 mg by mouth daily. Thurnell Lose, MD Taking Active Self  atorvastatin (LIPITOR) 80 MG tablet [id number] Yes TAKE 1 TABLET BY MOUTH  DAILY  Patient taking differently: Take 80 mg by mouth daily.   Ria Bush, MD Taking Active   baclofen (LIORESAL) 10 MG tablet [id number] Yes Take 1 tablet (10 mg total) by mouth at bedtime as needed for muscle spasms. Ria Bush, MD Taking Active   carvedilol (COREG) 3.125 MG tablet [id number] Yes TAKE 1 TABLET BY MOUTH  TWICE DAILY WITH A MEAL  Patient taking differently: Take 3.125 mg by mouth 2 (two) times daily with a meal.   Ria Bush, MD Taking Active Self  empagliflozin (JARDIANCE) 10 MG TABS tablet [id number] Yes Take 1 tablet (10 mg total) by mouth daily. Ria Bush, MD Taking Active   fluticasone [facility]) 50 MCG/ACT nasal spray [id number] Yes USE 2 SPRAYS IN BOTH   NOSTRILS DAILY Ria Bush, MD Taking Active Self  gabapentin (NEURONTIN) 100 MG capsule [id number] Yes TAKE 1 CAPSULE BY MOUTH TWO TIMES DAILY Ria Bush, MD Taking Active   glipiZIDE (GLUCOTROL XL) 5 MG 24 hr tablet [id number] Yes TAKE 1 TABLET BY MOUTH  DAILY WITH Sander Radon, MD Taking Active   glucose blood [facility] ULTRA) test strip [id number] Yes USE WITH METER TO CHECK  BLOOD SUGAR TWICE DAILY Ria Bush, MD Taking Active Self  guaiFENesin (MUCINEX) 600 MG 12 hr tablet [id number] Yes Take 600 mg by mouth 2 (two) times daily as needed for cough. [provider] Taking Active Self  JARDIANCE 10 MG TABS tablet [id number] Yes TAKE 1 TABLET(10 MG) BY MOUTH DAILY BEFORE Sander Radon, MD Taking Active   losartan (COZAAR) 50 MG tablet [id number] Yes TAKE 1 TABLET BY MOUTH  DAILY Ria Bush, MD Taking Active   metFORMIN (GLUCOPHAGE) 1000 MG tablet [id number] Yes Take 1 tablet (1,000 mg total) by mouth daily with breakfast. Ria Bush, MD Taking Active   omeprazole (PRILOSEC) 40 MG capsule [id number] Yes TAKE 1 CAPSULE(40 MG) BY MOUTH DAILY Gatha Mayer, MD Taking Active   oxyCODONE-acetaminophen (PERCOCET) 10-325 MG tablet [id number] Yes Take 1 tablet by mouth every 4 (four) hours as needed for pain.  Patient taking differently: Take 1 tablet by mouth every 4 (four) hours.   Ardis Hughs, MD Taking Active   polyvinyl alcohol (LIQUIFILM TEARS) 1.4 % ophthalmic solution [id number] Yes Place 1 drop into both eyes as needed for dry eyes. [provider] Taking Active Self          Patient Active Problem List   Diagnosis Date Noted  . Acquired dilation of ascending aorta and aortic root ([place])   . NSAID-induced gastric ulcer 01/07/2019  . Pain due to onychomycosis of toenails of both feet 10/15/2018  . CAD (coronary artery disease) 09/03/2018  . Abnormal stress test   . Chest pain 08/25/2018  . Tongue lesion  08/05/2018  . Fatty liver 07/31/2018  . Lightheadedness 08/06/2017  . Erectile dysfunction 08/01/2015  . Health maintenance examination 01/31/2015  . Gross hematuria   . Lipoma of neck 10/01/2014  . Allergic rhinitis 10/01/2014  . Diabetic neuropathy ([place]) 05/31/2014  . Advanced care planning/counseling discussion  01/26/2014  . OSA (obstructive sleep apnea)   . Tinea cruris 10/08/2012  . Malignant neoplasm of prostate ([place]) 06/16/2012  . Severe obesity (BMI 35.0-39.9) with comorbidity ([place]) 01/08/2012  . Esophageal dysphagia 10/10/2011  . Diverticulosis 10/09/2011  . 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 Insomnia 06/19/2011  . Shoulder pain 04/08/2011  . Neck pain, chronic 02/24/2011  . Medicare annual wellness visit, subsequent 10/26/2010  . Iron deficiency anemia 07/01/2009  . GERD 09/29/2008  . Essential hypertension 01/01/2008  . ANXIETY DEPRESSION 03/19/2007  . TIA (transient ischemic attack) 03/05/2007  . Back pain with radiculopathy 12/31/2006  . ABDOMINAL WALL HERNIA 10/03/2006  . Diabetes mellitus type 2, uncontrolled, with complications ([place]) 64/15/8309  . History of cocaine use 10/01/2006  . Hyperlipidemia associated with type 2 diabetes mellitus ([place]) 08/21/2005    Immunization History  Administered Date(s) Administered  . Fluad Quad(high Dose 65+) 02/03/2019, 03/09/2020  . Influenza Split 02/06/2011, 01/08/2012  . Influenza Whole 03/19/2007, 02/19/2008, 01/27/2009, 01/25/2010  . Influenza, High Dose Seasonal PF 03/05/2018  . Influenza,inj,Quad PF,6+ Mos 02/20/2013, 01/26/2014, 01/31/2015, 02/02/2016, 03/04/2017  . Influenza,inj,quad, With Preservative 01/22/2019  . PFIZER(Purple Top)SARS-COV-2 Vaccination 08/22/2019, 09/12/2019  . Pneumococcal Conjugate-13 08/01/2015  . Pneumococcal Polysaccharide-23 06/16/2012, 04/30/2019  . Td 04/24/2003  . Tdap 01/26/2014  . Zoster 12/15/2010    Conditions to be addressed/monitored:  Hypertension, Hyperlipidemia,  Diabetes and Coronary Artery Disease  Care Plan : [place]  Updates made by [name], [facility] since 06/07/2020 12:00 AM    Problem: Hypertension, Hyperlipidemia, Diabetes and Coronary Artery Disease   Priority: High  Onset Date: 06/07/2020  Note:    Current Barriers:  . Unable to independently afford treatment regimen . Unable to achieve control of diabetes   Pharmacist Clinical Goal(s):  Marland Kitchen Over the next 30 days, patient will achieve control of diabetes as evidenced by home BG within goal fasting 80-130 and post-prandial less than 180 through collaboration with PharmD and provider.   Interventions: . 1:1 collaboration with Ria Bush, MD regarding development and update of comprehensive plan of care as evidenced by provider attestation and co-signature . Inter-disciplinary care team collaboration (see longitudinal plan of care) . Comprehensive medication review performed; medication list updated in electronic medical record  Hypertension (BP goal <140/90) -controlled -Current treatment:  Carvedilol 3.169m - 1 twice daily  Losartan 588m- 1 daily -Medications previously tried: none  -Current home readings: using digital monitor with arm cuff   06/05/20 124/71, 82 --> recheck 132/61, 38   06/06/20  150/83, 75 --> recheck 146/64, 41  -Current dietary habits:  Not following a particular diet but tries to limit salt  -Current exercise habits: None formal, tries to get out and walk at the store, limits sedentary time  -Denies hypotensive/hypertensive symptoms - reports dizziness has resolved since off tamsulosin   -Educated on Proper BP monitoring technique; Discussed accurate monitoring as his HR is very low. Pt denies any symptoms associated with low HR. Recently evaluated by ED for lightheadedness and chest pain and no abnormalities besides labile blood pressure. HR usually within normal range. -Counseled to continue monitoring BP at home a couple days per  week, document, and provide log at future appointments. -Recommended to continue current medication; Assistant call for BP/HR log in 1 month.  Hyperlipidemia/CAD: (LDL goal < 70) -controlled -Current treatment: . Atorvastatin 80 mg - 1 tablet daily . Aspirin 81 mg - 1 tablet daily  -Medications previously tried: none -Reviewed adherence to statin and aspirin -  confirms daily  -Recommended to continue current medication  Diabetes (A1c goal <7%) -uncontrolled - reports he would like his A1c < 6% -Current medications: Marland Kitchen Metformin 1000 mg - 1 tablet daily . Jardiance 10 mg - 1 tablet before breakfast . Glipizide 5 mg - 1 tablet daily with breakfast -Takes all three medications 30 minutes before breakfast. Reports was taking metformin 1 BID until this week bc he realized rx said 1 daily. He is open to increasing back to BID or increasing Jardiance. -Medications previously tried: previously on higher dose metformin and glipizide, pt decreased due to lows, denies any GI upset.  -Current home glucose readings: checks twice daily before breakfast and after meal (around 2 PM) . Fasting glucose: 175, 157, 177, 145 . post prandial glucose: 137 (after lunch), 86 (after lunch), 119 (after BF), 149 (after BF), 153 (after BF) . Hypoglycemia: Denies any less than 70 this month, reports he can feel when sugar is getting down < 110. Eats some peanut butter or grape jelly when this happens. Pt reports he waits a long time between breakfast and lunch, occasionally skips meals. -Reports hypoglycemic symptoms occasionally with 110 -Current meal patterns: tries to watch portions/tries to eat balanced plate with baked meats, vegetables - green beans, turnip greens, onion, bell pepper. Limits potatoes/rice.  Marland Kitchen drinks: diet tea or diet soda, no juices -Current exercise: none formal -Educated onA1c and blood sugar goals; Carbohydrate counting and/or plate method -Recommended increase metformin to 1000 mg twice  daily; Recommend glucose tablets from pharmacy for hypoglycemia < 70. Avoid skipping or delaying meals.  Neuropathy (Goal: Control symptoms) -controlled -Current treatment  . Gabapentin 100 mg - 1 capsule twice daily -Medications previously tried: none -Pt plans to take 2 at bedtime instead of twice daily due to daytime drowsiness. Reports symptoms are usually worse at night and has noticed improvement on gabapentin. -Recommended to continue current medication  Patient Goals/Self-Care Activities . Over the next 30 days, patient will:  - check glucose daily, document, and provide at future appointments check blood pressure twice weekly , document, and provide at future appointments  Follow Up Plan: Telephone follow up appointment with care management team member scheduled for: 3 months follow up, 30 days assistant will call for BP and BG log      Medication Assistance: Viagra - send GoodRx Coupon  Patient's preferred pharmacy is:  [facility], [place] [address] Phone: [phone] Fax: [phone]  [facility], [place], [address] [place], [place] [address] [place] [address] Phone: [phone] Fax: [phone]  He would like [place] to send Omeprazole to [facility]. He receives all meds from [facility] except Oxycodone from [place]. No longer using [employer] program for [place], gets at no cost through [facility].  Uses pill box? Yes Pt endorses 100 % compliance  We discussed: Current pharmacy is preferred with insurance plan and patient is satisfied with pharmacy services Patient decided to: Continue current medication management strategy  Care Plan and Follow Up Patient Decision:  Patient agrees to Care Plan and Follow-up.  Plan: The care management team will reach out to the patient again over the next 30 days.  [name], PharmD Clinical Pharmacist [facility] at [facility] [phone]

## 2020-06-07 NOTE — Progress Notes (Signed)
I have collaborated with the care management provider regarding care management and care coordination activities outlined in this encounter and have reviewed this encounter including documentation in the note and care plan. I am certifying that I agree with the content of this note and encounter as supervising physician.  

## 2020-06-07 NOTE — Telephone Encounter (Signed)
-----   Message from [place], [facility] sent at 06/07/2020 10:19 AM EST ----- Regarding: Refill Pt would like his omeprazole sent to [facility] instead of [facility].  [name], PharmD Clinical Pharmacist [facility] at [facility] [phone]

## 2020-06-07 NOTE — Patient Instructions (Addendum)
June 07, 2020  Dear [name],  It was a pleasure meeting you during our initial appointment on June 07, 2020. Below is a summary of the goals we discussed and components of chronic care management. Please contact me anytime with questions or concerns.   Visit Information  Goals Addressed            This Visit's Progress    Monitor and Manage My Blood Sugar-Diabetes Type 2       Timeframe:  Long-Range Goal Priority:  High Start Date:              06/07/20               Expected End Date:        06/07/21  Follow Up Date - 1 month 07/05/20   - check blood sugar at prescribed times - check blood sugar if I feel it is too high or too low - take the blood sugar log to all doctor visits    Why is this important?    Checking your blood sugar at home helps to keep it from getting very high or very low.   Writing the results in a diary or log helps the doctor know how to care for you.   Your blood sugar log should have the time, date and the results.   Also, write down the amount of insulin or other medicine that you take.   Other information, like what you ate, exercise done and how you were feeling, will also be helpful.          Patient Care Plan: CCM Pharmacy Care Plan    Problem Identified: Hypertension, Hyperlipidemia, Diabetes and Coronary Artery Disease   Priority: High  Onset Date: 06/07/2020  Note:    Current Barriers:   Unable to independently afford treatment regimen  Unable to achieve control of diabetes   Pharmacist Clinical Goal(s):   Over the next 30 days, patient will achieve control of diabetes as evidenced by home BG within goal fasting 80-130 and post-prandial less than 180 through collaboration with PharmD and provider.   Interventions:  1:1 collaboration with Ria Bush, MD regarding development and update of comprehensive plan of care as evidenced by provider attestation and co-signature  Inter-disciplinary care team  collaboration (see longitudinal plan of care)  Comprehensive medication review performed; medication list updated in electronic medical record  Hypertension (BP goal <140/90) -controlled -Current treatment:  Carvedilol 3.125mg  - 1 twice daily  Losartan 50mg  - 1 daily -Medications previously tried: none  -Current home readings: using digital monitor with arm cuff   06/05/20 124/71, 82 --> recheck 132/61, 38   06/06/20  150/83, 75 --> recheck 146/64, 41  -Current dietary habits:  Not following a particular diet but tries to limit salt  -Current exercise habits: None formal, tries to get out and walk at the store, limits sedentary time  -Denies hypotensive/hypertensive symptoms - reports dizziness has resolved since off tamsulosin   -Educated on Proper BP monitoring technique; Discussed accurate monitoring as his HR is very low. Pt denies any symptoms associated with low HR. Recently evaluated by ED for lightheadedness and chest pain and no abnormalities besides labile blood pressure. HR usually within normal range. -Counseled to continue monitoring BP at home a couple days per week, document, and provide log at future appointments. -Recommended to continue current medication; Assistant call for BP/HR log in 1 month.  Hyperlipidemia/CAD: (LDL goal < 70) -controlled -Current treatment:  Atorvastatin  80 mg - 1 tablet daily  Aspirin 81 mg - 1 tablet daily  -Medications previously tried: none -Reviewed adherence to statin and aspirin - confirms daily  -Recommended to continue current medication  Diabetes (A1c goal <7%) -uncontrolled - reports he would like his A1c < 6% -Current medications:  Metformin 1000 mg - 1 tablet daily  Jardiance 10 mg - 1 tablet before breakfast  Glipizide 5 mg - 1 tablet daily with breakfast -Takes all three medications 30 minutes before breakfast. Reports was taking metformin 1 BID until this week bc he realized rx said 1 daily. He is open to increasing  back to BID or increasing Jardiance. -Medications previously tried: previously on higher dose metformin and glipizide, pt decreased due to lows, denies any GI upset.  -Current home glucose readings: checks twice daily before breakfast and after meal (around 2 PM)  Fasting glucose: 175, 157, 177, 145  post prandial glucose: 137 (after lunch), 86 (after lunch), 119 (after BF), 149 (after BF), 153 (after BF)  Hypoglycemia: Denies any less than 70 this month, reports he can feel when sugar is getting down < 110. Eats some peanut butter or grape jelly when this happens. Pt reports he waits a long time between breakfast and lunch, occasionally skips meals. -Reports hypoglycemic symptoms occasionally with 110 -Current meal patterns: tries to watch portions/tries to eat balanced plate with baked meats, vegetables - green beans, turnip greens, onion, bell pepper. Limits potatoes/rice.   drinks: diet tea or diet soda, no juices -Current exercise: none formal -Educated onA1c and blood sugar goals; Carbohydrate counting and/or plate method -Recommended increase metformin to 1000 mg twice daily; Recommend glucose tablets from pharmacy for hypoglycemia < 70. Avoid skipping or delaying meals.  Neuropathy (Goal: Control symptoms) -controlled -Current treatment   Gabapentin 100 mg - 1 capsule twice daily -Medications previously tried: none -Pt plans to take 2 at bedtime instead of twice daily due to daytime drowsiness. Reports symptoms are usually worse at night and has noticed improvement on gabapentin. -Recommended to continue current medication  Patient Goals/Self-Care Activities  Over the next 30 days, patient will:  - check glucose daily, document, and provide at future appointments check blood pressure twice weekly , document, and provide at future appointments  Follow Up Plan: Telephone follow up appointment with care management team member scheduled for: 3 months follow up, 30 days assistant  will call for BP and BG log      [name] was given information about Chronic Care Management services today including:  1. CCM service includes personalized support from designated clinical staff supervised by his physician, including individualized plan of care and coordination with other care providers 2. 24/7 contact phone numbers for assistance for urgent and routine care needs. 3. Standard insurance, coinsurance, copays and deductibles apply for chronic care management only during months in which we provide at least 20 minutes of these services. Most insurances cover these services at 100%, however patients may be responsible for any copay, coinsurance and/or deductible if applicable. This service may help you avoid the need for more expensive face-to-face services. 4. Only one practitioner may furnish and bill the service in a calendar month. 5. The patient may stop CCM services at any time (effective at the end of the month) by phone call to the office staff.  Patient agreed to services and verbal consent obtained.   The patient verbalized understanding of instructions, educational materials, and care plan provided today and agreed to receive a mailed  copy of patient instructions, educational materials, and care plan.    Follow Up Plan: Telephone follow up appointment with care management team member scheduled for: 3 months follow up with Sharyn Lull, 30 days assistant will call for BP and BG log  [name], PharmD Clinical Pharmacist [facility] at [facility] [phone]    Diabetes Mellitus and Nutrition, Adult When you have diabetes, or diabetes mellitus, it is very important to have healthy eating habits because your blood sugar (glucose) levels are greatly affected by what you eat and drink. Eating healthy foods in the right amounts, at about the same times every day, can help you:  Control your blood glucose.  Lower your risk of heart disease.  Improve  your blood pressure.  Reach or maintain a healthy weight. What can affect my meal plan? Every person with diabetes is different, and each person has different needs for a meal plan. Your health care provider may recommend that you work with a dietitian to make a meal plan that is best for you. Your meal plan may vary depending on factors such as:  The calories you need.  The medicines you take.  Your weight.  Your blood glucose, blood pressure, and cholesterol levels.  Your activity level.  Other health conditions you have, such as heart or kidney disease. How do carbohydrates affect me? Carbohydrates, also called carbs, affect your blood glucose level more than any other type of food. Eating carbs naturally raises the amount of glucose in your blood. Carb counting is a method for keeping track of how many carbs you eat. Counting carbs is important to keep your blood glucose at a healthy level, especially if you use insulin or take certain oral diabetes medicines. It is important to know how many carbs you can safely have in each meal. This is different for every person. Your dietitian can help you calculate how many carbs you should have at each meal and for each snack. How does alcohol affect me? Alcohol can cause a sudden decrease in blood glucose (hypoglycemia), especially if you use insulin or take certain oral diabetes medicines. Hypoglycemia can be a life-threatening condition. Symptoms of hypoglycemia, such as sleepiness, dizziness, and confusion, are similar to symptoms of having too much alcohol.  Do not drink alcohol if: ? Your health care provider tells you not to drink. ? You are pregnant, may be pregnant, or are planning to become pregnant.  If you drink alcohol: ? Do not drink on an empty stomach. ? Limit how much you use to:  0-1 drink a day for women.  0-2 drinks a day for men. ? Be aware of how much alcohol is in your drink. In the [place] one drink equals one 12 oz  bottle of beer (355 mL), one 5 oz glass of wine (148 mL), or one 1 oz glass of hard liquor (44 mL). ? Keep yourself hydrated with water, diet soda, or unsweetened iced tea.  Keep in mind that regular soda, juice, and other mixers may contain a lot of sugar and must be counted as carbs. What are tips for following this plan? Reading food labels  Start by checking the serving size on the "Nutrition Facts" label of packaged foods and drinks. The amount of calories, carbs, fats, and other nutrients listed on the label is based on one serving of the item. Many items contain more than one serving per package.  Check the total grams (g) of carbs in one serving. You can  calculate the number of servings of carbs in one serving by dividing the total carbs by 15. For example, if a food has 30 g of total carbs per serving, it would be equal to 2 servings of carbs.  Check the number of grams (g) of saturated fats and trans fats in one serving. Choose foods that have a low amount or none of these fats.  Check the number of milligrams (mg) of salt (sodium) in one serving. Most people should limit total sodium intake to less than 2,300 mg per day.  Always check the nutrition information of foods labeled as "low-fat" or "nonfat." These foods may be higher in added sugar or refined carbs and should be avoided.  Talk to your dietitian to identify your daily goals for nutrients listed on the label. Shopping  Avoid buying canned, pre-made, or processed foods. These foods tend to be high in fat, sodium, and added sugar.  Shop around the outside edge of the grocery store. This is where you will most often find fresh fruits and vegetables, bulk grains, fresh meats, and fresh dairy. Cooking  Use low-heat cooking methods, such as baking, instead of high-heat cooking methods like deep frying.  Cook using healthy oils, such as olive, canola, or sunflower oil.  Avoid cooking with butter, cream, or high-fat  meats. Meal planning  Eat meals and snacks regularly, preferably at the same times every day. Avoid going long periods of time without eating.  Eat foods that are high in fiber, such as fresh fruits, vegetables, beans, and whole grains. Talk with your dietitian about how many servings of carbs you can eat at each meal.  Eat 4-6 oz (112-168 g) of lean protein each day, such as lean meat, chicken, fish, eggs, or tofu. One ounce (oz) of lean protein is equal to: ? 1 oz (28 g) of meat, chicken, or fish. ? 1 egg. ?  cup (62 g) of tofu.  Eat some foods each day that contain healthy fats, such as avocado, nuts, seeds, and fish.   What foods should I eat? Fruits Berries. Apples. Oranges. Peaches. Apricots. Plums. Grapes. Mango. Papaya. Pomegranate. Kiwi. Cherries. Vegetables Lettuce. Spinach. Leafy greens, including kale, chard, collard greens, and mustard greens. Beets. Cauliflower. Cabbage. Broccoli. Carrots. Green beans. Tomatoes. Peppers. Onions. Cucumbers. Brussels sprouts. Grains Whole grains, such as whole-wheat or whole-grain bread, crackers, tortillas, cereal, and pasta. Unsweetened oatmeal. Quinoa. Brown or wild rice. Meats and other proteins Seafood. Poultry without skin. Lean cuts of poultry and beef. Tofu. Nuts. Seeds. Dairy Low-fat or fat-free dairy products such as milk, yogurt, and cheese. The items listed above may not be a complete list of foods and beverages you can eat. Contact a dietitian for more information. What foods should I avoid? Fruits Fruits canned with syrup. Vegetables Canned vegetables. Frozen vegetables with butter or cream sauce. Grains Refined white flour and flour products such as bread, pasta, snack foods, and cereals. Avoid all processed foods. Meats and other proteins Fatty cuts of meat. Poultry with skin. Breaded or fried meats. Processed meat. Avoid saturated fats. Dairy Full-fat yogurt, cheese, or milk. Beverages Sweetened drinks, such as soda  or iced tea. The items listed above may not be a complete list of foods and beverages you should avoid. Contact a dietitian for more information. Questions to ask a health care provider  Do I need to meet with a diabetes educator?  Do I need to meet with a dietitian?  What number can I call if I  have questions?  When are the best times to check my blood glucose? Where to find more information:  American Diabetes Association: diabetes.org  Academy of Nutrition and Dietetics: www.eatright.[employer] of Diabetes and Digestive and Kidney Diseases: [employer]  Association of Diabetes Care and Education Specialists: www.diabeteseducator.org Summary  It is important to have healthy eating habits because your blood sugar (glucose) levels are greatly affected by what you eat and drink.  A healthy meal plan will help you control your blood glucose and maintain a healthy lifestyle.  Your health care provider may recommend that you work with a dietitian to make a meal plan that is best for you.  Keep in mind that carbohydrates (carbs) and alcohol have immediate effects on your blood glucose levels. It is important to count carbs and to use alcohol carefully. This information is not intended to replace advice given to you by your health care provider. Make sure you discuss any questions you have with your health care provider. Document Revised: 03/17/2019 Document Reviewed: 03/17/2019 Elsevier Patient Education  2021 [employer].

## 2020-06-07 NOTE — Telephone Encounter (Signed)
E-scribed refill to [facility]. 

## 2020-06-13 ENCOUNTER — Ambulatory Visit: Payer: Medicare Other | Admitting: Podiatry

## 2020-06-13 ENCOUNTER — Other Ambulatory Visit: Payer: Self-pay

## 2020-06-13 DIAGNOSIS — L853 Xerosis cutis: Secondary | ICD-10-CM | POA: Diagnosis not present

## 2020-06-13 DIAGNOSIS — M779 Enthesopathy, unspecified: Secondary | ICD-10-CM

## 2020-06-13 MED ORDER — TRIAMCINOLONE ACETONIDE 0.1 % EX CREA: TOPICAL_CREAM | Freq: Two times a day (BID) | CUTANEOUS | Status: AC

## 2020-06-14 NOTE — Progress Notes (Signed)
Subjective:   Patient ID: [name], male   DOB: 70 y.o.   MRN: [id number]   HPI Patient presents with chronic skin condition between his big toe second toe left and the fourth and fifth toe stating that there is been some irritated type tissue and that it also is becoming inflamed around the joint   ROS      Objective:  Physical Exam  Neurovascular status intact with 2 separate problems with 1 being inflammatory capsulitis second MPJ left and secondarily it is noted that there is some irritation between the hallux and second toes fourth and fifth digits left     Assessment:  Combination of a dermatitis-like condition along with inflammatory capsular inflammation left second MPJ     Plan:  H&P educated patient on skin condition and we will try her on some triamcinolone cream and I did do sterile prep and injected the second MPJ 3 mg dexamethasone 5 mg Xylocaine with a small amount of Kenalog to reduce the inflammation and reappoint as needed

## 2020-06-15 ENCOUNTER — Telehealth: Payer: Self-pay

## 2020-06-15 ENCOUNTER — Other Ambulatory Visit: Payer: Self-pay

## 2020-06-15 NOTE — Telephone Encounter (Signed)
Looks like rx was stopped in 07/2018.  Ok to send new rx?  Last OV:  05/13/20, DM f/u Next OV:  none

## 2020-06-15 NOTE — Telephone Encounter (Signed)
-----   Message from [place], [facility] sent at 06/15/2020 10:13 AM EST ----- Regarding: RE: Refill I apologize, I was not clear. He does still need a refill. I think the old prescription is expired.  ----- Message ----- From: Brenton Grills, [place] Sent: 9/84/7308  10:12 AM EST To: [name], RPH Subject: RE: Refill                                     Noted.  I will not send refill.   Thanks, Lattie Haw ----- Message ----- From: [name], [facility] Sent: 06/15/2020  56:94 AM EST To: Brenton Grills, CMA Subject: Refill                                         On the sildenafil refill request - I noticed this has not been filled in a while due to cost concerns.  Provided a coupon for use at [employer] for sildenafil 100 mg quantity 10 tablets. He plans to use PRN.  [name], PharmD Clinical Pharmacist [facility] at [facility] [phone]

## 2020-06-15 NOTE — Telephone Encounter (Signed)
Contacted patient to let him know sildenafil coupon placed in mail today. Requested refill on medication from PCP per staff message to [facility]. Coupon is for sildenafil 100 mg - 10 tablets from [facility].   [name], PharmD Clinical Pharmacist [facility] at [facility] [phone]

## 2020-06-15 NOTE — Addendum Note (Signed)
Addended by: Brenton Grills on: 2/91/9166 06:00 AM   Modules accepted: Orders

## 2020-06-16 ENCOUNTER — Telehealth: Payer: Self-pay | Admitting: Podiatry

## 2020-06-16 DIAGNOSIS — H0102B Squamous blepharitis left eye, upper and lower eyelids: Secondary | ICD-10-CM | POA: Diagnosis not present

## 2020-06-16 DIAGNOSIS — H04123 Dry eye syndrome of bilateral lacrimal glands: Secondary | ICD-10-CM | POA: Diagnosis not present

## 2020-06-16 DIAGNOSIS — H1045 Other chronic allergic conjunctivitis: Secondary | ICD-10-CM | POA: Diagnosis not present

## 2020-06-16 DIAGNOSIS — H0102A Squamous blepharitis right eye, upper and lower eyelids: Secondary | ICD-10-CM | POA: Diagnosis not present

## 2020-06-16 DIAGNOSIS — H40013 Open angle with borderline findings, low risk, bilateral: Secondary | ICD-10-CM | POA: Diagnosis not present

## 2020-06-16 DIAGNOSIS — H353131 Nonexudative age-related macular degeneration, bilateral, early dry stage: Secondary | ICD-10-CM | POA: Diagnosis not present

## 2020-06-16 DIAGNOSIS — H524 Presbyopia: Secondary | ICD-10-CM | POA: Diagnosis not present

## 2020-06-16 DIAGNOSIS — E119 Type 2 diabetes mellitus without complications: Secondary | ICD-10-CM | POA: Diagnosis not present

## 2020-06-16 DIAGNOSIS — H16223 Keratoconjunctivitis sicca, not specified as Sjogren's, bilateral: Secondary | ICD-10-CM | POA: Diagnosis not present

## 2020-06-16 MED ORDER — SILDENAFIL CITRATE 100 MG PO TABS: 50.0000 mg | ORAL_TABLET | Freq: Every day | ORAL | 3 refills | Status: DC | PRN

## 2020-06-16 NOTE — Telephone Encounter (Signed)
Spoke with pt asking which [facility] location he's using.  States [facility] him.    Updated pt's pharmacy list and sent refill.  Pt aware.

## 2020-06-16 NOTE — Telephone Encounter (Signed)
Patient has requested prescription be sent over to pharmacy, stated he was told earlier this week it would be sent over, Please Advise

## 2020-06-16 NOTE — Addendum Note (Signed)
Addended by: Ria Bush on: 06/16/2020 10:44 AM   Modules accepted: Orders

## 2020-06-16 NOTE — Addendum Note (Signed)
Addended by: Brenton Grills on: 3/57/0177 93:90 PM   Modules accepted: Orders

## 2020-06-16 NOTE — Telephone Encounter (Addendum)
Which [facility]? Please refill once you find out what pharmacy

## 2020-06-17 ENCOUNTER — Other Ambulatory Visit: Payer: Self-pay | Admitting: Podiatry

## 2020-06-17 NOTE — Telephone Encounter (Signed)
That was the triamcinilone cream

## 2020-07-13 ENCOUNTER — Telehealth: Payer: Self-pay

## 2020-07-13 NOTE — Chronic Care Management (AMB) (Addendum)
Chronic Care Management Pharmacy Assistant   Name: [name]  MRN: [id number] DOB: Jun 17, 1950  Reason for Encounter: Disease State- Diabetes and Hypertension   Conditions to be addressed/monitored: HTN and DMII   Recent office visits:  No office visits since last CCM appointment  Recent consult visits:  [facility]  Medication Reconciliation was completed by comparing discharge summary, patient's EMR and Pharmacy list, and upon discussion with patient.  Admitted to the hospital on 03/07/20 due to dizziness. Discharge date was 03/07/20. Discharged from [facility]?Medications Started at [facility] ([place] Discharge:?? -N/A  Medication Changes at Hospital Discharge: -N/A  Medications Discontinued at Hospital Discharge: -N/A  Medications that remain the same after Hospital Discharge:??  -All other medications will remain the same.    Medications: Outpatient Encounter Medications as of 07/13/2020  Medication Sig   aspirin EC 81 MG tablet Take 81 mg by mouth daily.   atorvastatin (LIPITOR) 80 MG tablet TAKE 1 TABLET BY MOUTH  DAILY (Patient taking differently: Take 80 mg by mouth daily.)   baclofen (LIORESAL) 10 MG tablet Take 1 tablet (10 mg total) by mouth at bedtime as needed for muscle spasms.   carvedilol (COREG) 3.125 MG tablet TAKE 1 TABLET BY MOUTH  TWICE DAILY WITH A MEAL (Patient taking differently: Take 3.125 mg by mouth 2 (two) times daily with a meal.)   empagliflozin (JARDIANCE) 10 MG TABS tablet Take 1 tablet (10 mg total) by mouth daily.   fluticasone (FLONASE) 50 MCG/ACT nasal spray USE 2 SPRAYS IN BOTH  NOSTRILS DAILY   gabapentin (NEURONTIN) 100 MG capsule TAKE 1 CAPSULE BY MOUTH TWO TIMES DAILY   glipiZIDE (GLUCOTROL XL) 5 MG 24 hr tablet TAKE 1 TABLET BY MOUTH  DAILY WITH BREAKFAST   glucose blood (ONETOUCH ULTRA) test strip USE WITH METER TO CHECK  BLOOD SUGAR TWICE DAILY   guaiFENesin  (MUCINEX) 600 MG 12 hr tablet Take 600 mg by mouth 2 (two) times daily as needed for cough.   JARDIANCE 10 MG TABS tablet TAKE 1 TABLET(10 MG) BY MOUTH DAILY BEFORE BREAKFAST   losartan (COZAAR) 50 MG tablet TAKE 1 TABLET BY MOUTH  DAILY   metFORMIN (GLUCOPHAGE) 1000 MG tablet Take 1 tablet (1,000 mg total) by mouth daily with breakfast.   omeprazole (PRILOSEC) 40 MG capsule TAKE 1 CAPSULE(40 MG) BY MOUTH DAILY   oxyCODONE-acetaminophen (PERCOCET) 10-325 MG tablet Take 1 tablet by mouth every 4 (four) hours as needed for pain. (Patient taking differently: Take 1 tablet by mouth every 4 (four) hours.)   polyvinyl alcohol (LIQUIFILM TEARS) 1.4 % ophthalmic solution Place 1 drop into both eyes as needed for dry eyes.   sildenafil (VIAGRA) 100 MG tablet Take by mouth.   sildenafil (VIAGRA) 100 MG tablet Take 0.5-1 tablets (50-100 mg total) by mouth daily as needed for erectile dysfunction.   Facility-Administered Encounter Medications as of 07/13/2020  Medication   triamcinolone (KENALOG) 0.1 % cream    Recent Office Vitals: BP Readings from Last 3 Encounters:  05/13/20 124/82  03/09/20 126/80  03/07/20 140/85   Pulse Readings from Last 3 Encounters:  05/13/20 60  03/09/20 88  03/07/20 (!) 59    Wt Readings from Last 3 Encounters:  05/13/20 241 lb 4 oz (109.4 kg)  03/09/20 235 lb 9 oz (106.9 kg)  11/03/19 239 lb 1 oz (108.4 kg)     Kidney Function Lab Results  Component Value Date/Time  CREATININE 0.93 03/07/2020 01:48 PM   CREATININE 0.93 12/13/2019 11:40 AM   CREATININE 0.99 12/26/2012 02:43 PM   GFR 97.28 10/30/2019 07:53 AM   GFRNONAA >60 03/07/2020 01:48 PM   GFRAA >60 12/13/2019 11:40 AM    BMP Latest Ref Rng & Units 03/07/2020 12/13/2019 10/30/2019  Glucose 70 - 99 mg/dL 95 149(H) 173(H)  BUN 8 - 23 mg/dL 10 13 12   Creatinine 0.61 - 1.24 mg/dL 0.93 0.93 0.93  Sodium 135 - 145 mmol/L 140 139 140  Potassium 3.5 - 5.1 mmol/L 4.5 3.8 4.1  Chloride 98 - 111 mmol/L 104 104  100  CO2 22 - 32 mmol/L 26 22 29   Calcium 8.9 - 10.3 mg/dL 8.8(L) 8.9 9.5   Hypertension  Current antihypertensive regimen:  Carvedilol 3.125mg  - 1 twice daily Losartan 50mg  - 1 daily  Patient verbally confirms he is taking the above medications as directed. Yes  How often are you checking your Blood Pressure? 1-2x per week  he checks his blood pressure in the middle of the day after taking his medication.  Current home BP readings:   DATE:             BP               PULSE  06/27/20  144/55  84  -  117/55  64  -  148/76  63  -  121/77  69    Wrist or arm cuff: Arm Caffeine intake: Coffee 1-3 cups a day Salt intake: Avoids salt- does not like it OTC medications including pseudoephedrine or NSAIDs? No  Any readings above 180/120? No If yes any symptoms of hypertensive emergency? patient denies any symptoms of high blood pressure   What recent interventions/DTPs have been made by any provider to improve Blood Pressure control since last CPP Visit:  No recent changes or interventions  Any recent hospitalizations or ED visits since last visit with CPP? No  What diet changes have been made to improve Blood Pressure Control?  Trying to avoid carbs  What exercise is being done to improve your Blood Pressure Control?  States he walks    Recent Relevant Labs: Lab Results  Component Value Date/Time   HGBA1C 7.5 (A) 05/13/2020 12:07 PM   HGBA1C 7.4 (A) 03/09/2020 02:48 PM   HGBA1C 7.3 (H) 10/30/2019 07:53 AM   HGBA1C 9.2 (H) 07/28/2018 11:41 AM   MICROALBUR 3.2 (H) 06/16/2012 09:23 AM   MICROALBUR 0.1 10/24/2010 08:01 AM    Kidney Function Lab Results  Component Value Date/Time   CREATININE 0.93 03/07/2020 01:48 PM   CREATININE 0.93 12/13/2019 11:40 AM   CREATININE 0.99 12/26/2012 02:43 PM   GFR 97.28 10/30/2019 07:53 AM   GFRNONAA >60 03/07/2020 01:48 PM   GFRAA >60 12/13/2019 11:40 AM   Diabetes  Current antihyperglycemic regimen:  Metformin 1000 mg - 1  tablet daily (taking 1 tablet twice daily) Jardiance 10 mg - 1 tablet before breakfast Glipizide 5 mg - 1 tablet daily with breakfast   Patient verbally confirms he is taking the above medications as directed. Yes  What recent interventions/DTPs have been made to improve glycemic control:  increased metformin 1000 mg to twice daily at last CCM visit   Have there been any recent hospitalizations or ED visits since last visit with CPP? No  Patient denies hypoglycemic symptoms, including Pale, Sweaty, Shaky, Hungry, Nervous/irritable and Vision changes  Patient denies hyperglycemic symptoms, including blurry vision, excessive thirst, fatigue, polyuria and weakness  How often are you checking your blood sugar? once daily and twice daily  What are your blood sugars ranging?  Fasting: 141, 107, 150 Before meals: N/A After meals: N/A Bedtime: 127, 91, 111  On insulin? No  During the week, how often does your blood glucose drop below 70? Never  Are you checking your feet daily/regularly? Yes  Adherence Review: Is the patient currently on a STATIN medication? Yes Is the patient currently on ACE/ARB medication? Yes Does the patient have >5 day gap between last estimated fill dates? No gaps in adherence  Star Rating Drugs:  Medication:  Last Fill: Day Supply Atorvastatin 80 mg 04/10/20 90 DS Jardiance 10 mg 06/01/20 90 DS Glipizide 5 mg  04/13/20          90 DS Losartan 50 mg 05/20/20 90 DS Metformin 1000 mg 05/21/20 90 DS   Follow-Up:  Pharmacist Review  [name], CPP notified  Margaretmary Dys, [place] Assistant [phone]   I have reviewed the care management and care coordination activities outlined in this encounter and I am certifying that I agree with the content of this note. No further action required.  [name], PharmD Clinical Pharmacist [facility] at [facility] [phone]

## 2020-07-15 ENCOUNTER — Telehealth: Payer: Self-pay

## 2020-07-15 MED ORDER — METFORMIN HCL 1000 MG PO TABS: 1000.0000 mg | ORAL_TABLET | Freq: Two times a day (BID) | ORAL | 3 refills | Status: DC

## 2020-07-15 NOTE — Telephone Encounter (Signed)
Higher dose sent in to [facility].

## 2020-07-15 NOTE — Telephone Encounter (Signed)
At last visit CCM, pt reported accidentally taking Metformin 1000 mg BID instead of once daily. Since his BG was doing better on BID and kidney function appropriate, opted to have him continue 2000 mg/day. He continues to do well on this dose, denies any upset stomach or hypoglycemia. If no concerns, please send prescription to [facility] pharmacy to reflect current dosing.   [name], PharmD Clinical Pharmacist [facility] [phone]

## 2020-07-16 ENCOUNTER — Other Ambulatory Visit: Payer: Self-pay | Admitting: Family Medicine

## 2020-07-16 ENCOUNTER — Telehealth: Payer: Self-pay | Admitting: Family Medicine

## 2020-07-16 NOTE — Telephone Encounter (Signed)
Received request from apartment complex for verification of disability for requested accommodations.  I don't remember talking about this recently and I need more info - what is disability and what is accommodation requested?

## 2020-07-18 NOTE — Telephone Encounter (Signed)
Patient is scheduled. 07/26/20 at 10:00

## 2020-07-18 NOTE — Telephone Encounter (Signed)
Last filled 05-13-20 #90 Last OV 05-13-20 Next OV 07-26-20 [facility]

## 2020-07-18 NOTE — Telephone Encounter (Signed)
Left message on voicemail for patient to call the office back. 

## 2020-07-18 NOTE — Telephone Encounter (Addendum)
May need to schedule OV if can't wait until next appt in May.

## 2020-07-20 NOTE — Telephone Encounter (Signed)
Patient is calling in stating he received a call from [facility] that there hasn't been any updates.

## 2020-07-21 NOTE — Progress Notes (Signed)
CARDIOLOGY CONSULT NOTE       Patient ID: [name] MRN: [id number] DOB/AGE: Jul 06, 1950 70 y.o.  Admit date: (Not on file) Referring Physician: Danise [name] Primary Physician: Robert Bush, MD Primary Cardiologist: [name] before Reason for Consultation: Aortic Aneurysm   Active Problems:   * No active hospital problems. *   HPI:  70 y.o. I have not seen since 2013. History of HTN, DM-2, HLD, GERD, Prostate cancer and remote history of substance abuse. Had normal cath in 2013 And again in May 2020. [name] note indicates severe right subclavian tortuosity and not to use right radial for cath again EF normal 55-60% TTE done 04/12/20 showed aortic root 4.3 cm with EF 55-60% tricuspid AV with mild AR CT done 07/26/20 showed no aortic aneurysm   He is long time divorced. Retired from cooking Has a daughter in town Works out and bakes   No cardiac complaints  Likes his [place] All other systems reviewed and negative except as noted above  Past Medical History:  Diagnosis Date  . Acquired dilation of ascending aorta and aortic root (HCC)    55mm ascending aorta  . Allergic rhinitis   . Anxiety   . Atherosclerosis of aorta ([place])   . CAD (coronary artery disease) cardiologist-- [name]   long hx atypical chest pain--- cardiac cath 08-24-2002 in epic showed normal coronaries;   03-17-2007  nuclear stress test showed ?small infarct , ef 60%;  cath done 12-31-2007 for recurrent CP showed nonob cad;   08-27-2018 stress test showed intermediate risk w/ apex ischemia, nuclear ef 61%;   cath done 08-29-2018 showed Nonobstructive 25% plaque in LAD   . Chronic neck pain    narcotic dependant  . DDD (degenerative disc disease), cervical   . Depression   . Diabetic neuropathy ([place])   . Diverticulosis 2013   by CT scan  . Erectile dysfunction 08/01/2015  . Essential hypertension 01/01/2008  . Fatty liver 07/31/2018   By [place] 07/2018,  followed by pcp  . GERD  (gastroesophageal reflux disease)   . Hemorrhoids   . History of anemia   . History of cocaine abuse ([place])    per pt quit 05/ 2004  . History of cocaine use 2004  . History of colon polyps   . History of epididymitis   . History of esophageal stricture    s/p  dilatation 2009;  12-30-2018  . History of gastric ulcer    12-30-2018  EGD showed non-bleeding gastric ulcer  . History of prostatitis 08/2011  . History of transient ischemic attack (TIA)    remote hx , related to cocaine abuse which pt quit 2005  . Hyperlipidemia   . Hypertension    Essential  . OSA (obstructive sleep apnea)    study 05-07-2008 in epic , mild ,  pt does not use cpap  . Pain due to onychomycosis of toenails of both feet 10/15/2018  . Prostate cancer [facility]) urologist--- [name]/  oncologist--- [name]   first dx 01/ 2018 w/ Stage T2a and Gleason 3+3 , active survillance;  repear bx 04-21-2019 Gleason 3+4,  scheduled for brachytherapy 07-03-2019  . T2DM (type 2 diabetes mellitus) ([place])    followed by pcp---  (03-04-201  checks blood sugar 3 times daily,  fasting sugar's-- 140-150    Family History  Problem Relation Age of Onset  . Diabetes Robert   . Hypertension Robert   . Hyperlipidemia Robert   .  Diabetes Father   . Diabetes Sister   . Hypertension Sister   . Hypertension Brother   . Diabetes Brother   . Diabetes Brother   . Hypertension Brother   . Diabetes Brother   . Heart disease Maternal Uncle        Heart failure  . Cancer Paternal Uncle        Prostate  . Cancer Paternal Grandfather        Prostate  . Stroke Neg Hx   . Esophageal cancer Neg Hx   . Rectal cancer Neg Hx   . Stomach cancer Neg Hx   . Breast cancer Neg Hx   . Colon cancer Neg Hx     Social History   Socioeconomic History  . Marital status: Single    Spouse name: Not on file  . Number of children: 1  . Years of education: Not on file  . Highest education level: Not on file  Occupational History  .  Occupation: [name] previously    Fish farm manager: UNEMPLOYED    Comment: Now on disability after neck surgery  Tobacco Use  . Smoking status: Never Smoker  . Smokeless tobacco: Never Used  Vaping Use  . Vaping Use: Never used  Substance and Sexual Activity  . Alcohol use: No    Alcohol/week: 0.0 standard drinks  . Drug use: Not Currently    Comment: H/O marijuana (last 2005); Cocaine 5/04  . Sexual activity: Not on file  Other Topics Concern  . 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   Activity: no regular exercise, wants to start walking   Diet: good water, fruits/vegetables daily.      Retired   1 daughter and 1 granddaughter   Social Determinants of Radio broadcast assistant Strain: Medium Risk  . Difficulty of Paying Living Expenses: Somewhat hard  Food Insecurity: No Food Insecurity  . Worried About Charity fundraiser in the Last Year: Never true  . Ran Out of Food in the Last Year: Never true  Transportation Needs: No Transportation Needs  . [name] of Transportation (Medical): No  . [name] of Transportation (Non-Medical): No  Physical Activity: Inactive  . Days of Exercise per Week: 0 days  . Minutes of Exercise per Session: 0 min  Stress: No Stress Concern Present  . Feeling of Stress : Not at all  Social Connections: Not on file  Intimate Partner Violence: Not At Risk  . Fear of Current or Ex-Partner: No  . Emotionally Abused: No  . Physically Abused: No  . Sexually Abused: No    Past Surgical History:  Procedure Laterality Date  . ANTERIOR CERVICAL DECOMP/DISCECTOMY FUSION  06-24-2008  and 10-28-2008 @[name]    06-25-2008  C7 -- T1;    10-28-2008  C5 -- C7  . CARDIAC CATHETERIZATION  08-24-2002   Robert gamble  @[name]    normal coronaries  . CARDIAC CATHETERIZATION  12-31-2007    Robert Johnsie Cancel    nonobstructive CAD  . CARPAL TUNNEL RELEASE Right 2000  approx.  Marland Kitchen CATARACT EXTRACTION W/ INTRAOCULAR LENS  IMPLANT, BILATERAL  2016  . COLONOSCOPY  10/11/2011   severe diverticulosis, int hemorrhoids, rec rpt 5 yrs Carlean Purl)  . COLONOSCOPY  12/2018   benign rectal polyp Carlean Purl)  . DIRECT LARYNGOSCOPY  11/02/2008   @[name]   Extubation under anesthesia post op complication from ACDF  . ESOPHAGOGASTRODUODENOSCOPY  10/11/2011   WNL Carlean Purl)  . ESOPHAGOGASTRODUODENOSCOPY  09/11-2018   gastric ulcer, gastritis, dilated esophageal stenosis, limit NSAIDs continue PPI, no f/u planned Carlean Purl)  . EXCISION MASS NECK N/A 08/29/2016   Procedure: EXCISION MASS POSTERIOR NECK;  Surgeon: Erroll Luna, MD;  Location: [facility];  Service: General;  Laterality: N/A;  . HEMATOMA EVACUATION  10/29/2008   @[name]    post op ACDF 10-28-2008  . LAPAROSCOPIC INGUINAL HERNIA REPAIR Bilateral 11-22-2006   @[facility]    AND EXCISION RLQ ABDOMINAL LIPOMA  . LEFT HEART CATH AND CORONARY ANGIOGRAPHY N/A 08/29/2018   Procedure: LEFT HEART CATH AND CORONARY ANGIOGRAPHY;  Surgeon: Jettie Booze, MD;  Location: [facility];  Service: Cardiovascular;  Laterality: N/A;  . RADIOACTIVE SEED IMPLANT N/A 07/03/2019   Procedure: RADIOACTIVE SEED IMPLANT/BRACHYTHERAPY IMPLANT;  Surgeon: Ardis Hughs, MD;  Location[facility]  Service: Urology;  Laterality: N/A;  . SHOULDER ARTHROSCOPY WITH SUBACROMIAL DECOMPRESSION AND OPEN ROTATOR C Left 01/23/2013   Procedure: LEFT SHOULDER ARTHROSCOPY WITH SUBACROMIAL DECOMPRESSION AND MINI OPEN ROTATOR CUFF REPAIR, AND OPEN DISTAL CLAVICLE RESECTION;  Surgeon: Augustin Schooling, MD;  Location: [facility];  Service: Orthopedics;  Laterality: Left;  . SHOULDER SURGERY Right 05/2011  . SPACE OAR INSTILLATION N/A 07/03/2019   Procedure: SPACE OAR INSTILLATION;  Surgeon: Ardis Hughs, MD;  Location: [facility];  Service: Urology;  Laterality:  N/A;  . TONSILLECTOMY  child  . TRIGGER FINGER RELEASE Right 12-12-2005   @[facility]    Right long finger      Current Outpatient Medications:  .  aspirin EC 81 MG tablet, Take 81 mg by mouth daily., Disp: , Rfl:  .  atorvastatin (LIPITOR) 80 MG tablet, TAKE 1 TABLET BY MOUTH  DAILY (Patient taking differently: Take 80 mg by mouth daily.), Disp: 90 tablet, Rfl: 3 .  baclofen (LIORESAL) 10 MG tablet, TAKE 1 TABLET BY MOUTH AT  BEDTIME AS NEEDED FOR  MUSCLE SPASM(S), Disp: 90 tablet, Rfl: 0 .  carvedilol (COREG) 3.125 MG tablet, TAKE 1 TABLET BY MOUTH  TWICE DAILY WITH A MEAL (Patient taking differently: Take 3.125 mg by mouth 2 (two) times daily with a meal.), Disp: 180 tablet, Rfl: 3 .  empagliflozin (JARDIANCE) 10 MG TABS tablet, Take 1 tablet (10 mg total) by mouth daily., Disp: 90 tablet, Rfl: 0 .  fluticasone (FLONASE) 50 MCG/ACT nasal spray, USE 2 SPRAYS IN BOTH  NOSTRILS DAILY, Disp: 48 g, Rfl: 3 .  gabapentin (NEURONTIN) 100 MG capsule, TAKE 1 CAPSULE BY MOUTH TWO TIMES DAILY, Disp: 180 capsule, Rfl: 3 .  glipiZIDE (GLUCOTROL XL) 5 MG 24 hr tablet, TAKE 1 TABLET BY MOUTH  DAILY WITH BREAKFAST, Disp: 90 tablet, Rfl: 3 .  glucose blood (ONETOUCH ULTRA) test strip, USE WITH METER TO CHECK  BLOOD SUGAR TWICE DAILY, Disp: 200 strip, Rfl: 3 .  guaiFENesin (MUCINEX) 600 MG 12 hr tablet, Take 600 mg by mouth 2 (two) times daily as needed for cough., Disp: , Rfl:  .  JARDIANCE 10 MG TABS tablet, TAKE 1 TABLET(10 MG) BY MOUTH DAILY BEFORE BREAKFAST, Disp: 20 tablet, Rfl: 0 .  losartan (COZAAR) 50 MG tablet, TAKE 1 TABLET BY MOUTH  DAILY, Disp: 90 tablet, Rfl: 2 .  metFORMIN (GLUCOPHAGE) 1000 MG tablet, Take 1 tablet (1,000 mg total) by mouth 2 (two) times daily with a meal., Disp: 180 tablet, Rfl: 3 .  omeprazole (PRILOSEC) 40 MG capsule, TAKE 1 CAPSULE(40 MG) BY MOUTH DAILY, Disp: 90 capsule, Rfl: 1 .  oxyCODONE-acetaminophen (PERCOCET) 10-325 MG tablet, Take 1 tablet by mouth every 4 (four) hours as  needed for pain. (Patient taking differently: Take 1 tablet by mouth every 4 (four) hours.), Disp: 10 tablet, Rfl: 0 .  polyvinyl alcohol (LIQUIFILM TEARS) 1.4 % ophthalmic solution, Place 1 drop into both eyes as needed for dry eyes., Disp: , Rfl:  .  sildenafil (VIAGRA) 100 MG tablet, Take by mouth., Disp: , Rfl:  .  sildenafil (VIAGRA) 100 MG tablet, Take 0.5-1 tablets (50-100 mg total) by mouth daily as needed for erectile dysfunction., Disp: 5 tablet, Rfl: 3  Current Facility-Administered Medications:  .  triamcinolone (KENALOG) 0.1 % cream, , Topical, BID, Regal, Tamala Fothergill, DPM . triamcinolone   Topical BID     Physical Exam: There were no vitals taken for this visit.    Affect appropriate Healthy:  appears stated age 68: normal Neck supple with no adenopathy JVP normal no bruits no thyromegaly Lungs clear with no wheezing and good diaphragmatic motion Heart:  S1/S2 no murmur, no rub, gallop or click PMI normal Abdomen: benighn, BS positve, no tenderness, no AAA no bruit.  No HSM or HJR Distal pulses intact with no bruits No edema Neuro non-focal Skin warm and dry No muscular weakness   Labs:   Lab Results  Component Value Date   WBC 5.0 03/07/2020   HGB 13.3 03/07/2020   HCT 40.7 03/07/2020   MCV 91.3 03/07/2020   PLT 263 03/07/2020   No results for input(s): NA, K, CL, CO2, BUN, CREATININE, CALCIUM, PROT, BILITOT, ALKPHOS, ALT, AST, GLUCOSE in the last 168 hours.  Invalid input(s): LABALBU Lab Results  Component Value Date   CKTOTAL 4,038 (H) 11/03/2008   CKMB 2.4 11/02/2007   TROPONINI <0.03 08/26/2018    Lab Results  Component Value Date   CHOL 105 10/30/2019   CHOL 95 10/16/2018   CHOL 148 03/04/2017   Lab Results  Component Value Date   HDL 30.70 (L) 10/30/2019   HDL 35.50 (L) 10/16/2018   HDL 35.20 (L) 03/04/2017   Lab Results  Component Value Date   LDLCALC 61 10/30/2019   LDLCALC 45 10/16/2018   LDLCALC 93 03/04/2017   Lab Results   Component Value Date   TRIG 67.0 10/30/2019   TRIG 70.0 10/16/2018   TRIG 100.0 03/04/2017   Lab Results  Component Value Date   CHOLHDL 3 10/30/2019   CHOLHDL 3 10/16/2018   CHOLHDL 4 03/04/2017   Lab Results  Component Value Date   LDLDIRECT 104 (H) 12/26/2012   LDLDIRECT 162.4 03/02/2010      Radiology: No results found.  EKG: SR rate 65 low voltage 03/08/20   ASSESSMENT AND PLAN:   1. Chest Pain: non cardiac normal cath observe 2. Aneurysm:  Dilated root by echo but normal on CTA 07/25/20 reviewed 3.  HTN:  Well controlled.  Continue current medications and low sodium Dash type diet.   4. DM:  Discussed low carb diet.  Target hemoglobin A1c is 6.5 or less.  Continue current medications. 5. HLD  On statin labs with primary   F/u in a year  Signed: Jenkins Rouge 07/21/2020, 1:19 PM

## 2020-07-22 ENCOUNTER — Other Ambulatory Visit: Payer: Self-pay

## 2020-07-22 ENCOUNTER — Other Ambulatory Visit: Payer: Medicare Other | Admitting: *Deleted

## 2020-07-22 ENCOUNTER — Telehealth: Payer: Self-pay

## 2020-07-22 DIAGNOSIS — I712 Thoracic aortic aneurysm, without rupture, unspecified: Secondary | ICD-10-CM

## 2020-07-22 NOTE — Telephone Encounter (Signed)
-----   Message from Josue Hector, MD sent at 07/21/2020  1:25 PM EDT ----- I have not seen this patient since 2013 He is on my schedule next Friday Should have CTA chest for aortic aneurysm may need BMET

## 2020-07-22 NOTE — Telephone Encounter (Signed)
Called patient to let him know that Dr. Johnsie Cancel wants him to get a CT and we were going to try to schedule before his visit on 4/5. Patient verbalized understanding.

## 2020-07-23 LAB — BASIC METABOLIC PANEL
BUN/Creatinine Ratio: 12 (ref 10–24)
BUN: 11 mg/dL (ref 8–27)
CO2: 23 mmol/L (ref 20–29)
Calcium: 8.9 mg/dL (ref 8.6–10.2)
Chloride: 100 mmol/L (ref 96–106)
Creatinine, Ser: 0.93 mg/dL (ref 0.76–1.27)
Glucose: 145 mg/dL — ABNORMAL HIGH (ref 65–99)
Potassium: 3.7 mmol/L (ref 3.5–5.2)
Sodium: 141 mmol/L (ref 134–144)
eGFR: 88 mL/min/{1.73_m2} (ref >59)

## 2020-07-25 ENCOUNTER — Ambulatory Visit (INDEPENDENT_AMBULATORY_CARE_PROVIDER_SITE_OTHER)
Admission: RE | Admit: 2020-07-25 | Discharge: 2020-07-25 | Disposition: A | Discharge status: Home / Self Care | Payer: Medicare Other | Source: Ambulatory Visit | Attending: Cardiovascular Disease | Admitting: Cardiovascular Disease

## 2020-07-25 ENCOUNTER — Other Ambulatory Visit: Payer: Self-pay

## 2020-07-25 DIAGNOSIS — I712 Thoracic aortic aneurysm, without rupture, unspecified: Secondary | ICD-10-CM

## 2020-07-25 DIAGNOSIS — Z79899 Other long term (current) drug therapy: Secondary | ICD-10-CM | POA: Diagnosis not present

## 2020-07-25 DIAGNOSIS — M5412 Radiculopathy, cervical region: Secondary | ICD-10-CM | POA: Diagnosis not present

## 2020-07-25 DIAGNOSIS — R079 Chest pain, unspecified: Secondary | ICD-10-CM | POA: Diagnosis not present

## 2020-07-25 DIAGNOSIS — G894 Chronic pain syndrome: Secondary | ICD-10-CM | POA: Diagnosis not present

## 2020-07-25 MED ORDER — IOHEXOL 300 MG/ML  SOLN
100.0000 mL | Freq: Once | INTRAMUSCULAR | Status: AC | PRN
  Administered 2020-07-25: 100 mL via INTRAVENOUS

## 2020-07-26 ENCOUNTER — Ambulatory Visit: Payer: Medicare Other | Admitting: Cardiovascular Disease

## 2020-07-26 ENCOUNTER — Ambulatory Visit: Payer: Medicare Other | Admitting: Family Medicine

## 2020-07-26 ENCOUNTER — Encounter: Payer: Self-pay | Admitting: Cardiovascular Disease

## 2020-07-26 VITALS — BP 112/70 | HR 78 | Ht 67.75 in | Wt 244.0 lb

## 2020-07-26 DIAGNOSIS — R079 Chest pain, unspecified: Secondary | ICD-10-CM | POA: Diagnosis not present

## 2020-07-26 NOTE — Patient Instructions (Addendum)
Medication Instructions:  *If you need a refill on your cardiac medications before your next appointment, please call your pharmacy*  Lab Work: If you have labs (blood work) drawn today and your tests are completely normal, you will receive your results only by: Marland Kitchen MyChart Message (if you have MyChart) OR . A paper copy in the mail If you have any lab test that is abnormal or we need to change your treatment, we will call you to review the results.  Testing/Procedures: None ordered today.  Follow-Up: At [facility], you and your health needs are our priority.  As part of our continuing mission to provide you with exceptional heart care, we have created designated Provider Care Teams.  These Care Teams include your primary Cardiologist (physician) and Advanced Practice Providers (APPs -  Physician Assistants and Nurse Practitioners) who all work together to provide you with the care you need, when you need it.  We recommend signing up for the patient portal called "MyChart".  Sign up information is provided on this After Visit Summary.  MyChart is used to connect with patients for Virtual Visits (Telemedicine).  Patients are able to view lab/test results, encounter notes, upcoming appointments, etc.  Non-urgent messages can be sent to your provider as well.   To learn more about what you can do with MyChart, go to [email or web address].    Your next appointment:   12 month(s)  The format for your next appointment:   In Person  Provider:   You may see Dr. Johnsie Cancel or one of the following Advanced Practice Providers on your designated Care Team:    Kathyrn Drown, NP

## 2020-07-27 ENCOUNTER — Other Ambulatory Visit: Payer: Self-pay

## 2020-07-27 ENCOUNTER — Ambulatory Visit (INDEPENDENT_AMBULATORY_CARE_PROVIDER_SITE_OTHER): Payer: Medicare Other | Admitting: Family Medicine

## 2020-07-27 ENCOUNTER — Encounter: Payer: Self-pay | Admitting: Family Medicine

## 2020-07-27 VITALS — BP 122/72 | HR 86 | Temp 97.6°F | Ht 67.75 in | Wt 242.1 lb

## 2020-07-27 DIAGNOSIS — M25511 Pain in right shoulder: Secondary | ICD-10-CM

## 2020-07-27 DIAGNOSIS — M8949 Other hypertrophic osteoarthropathy, multiple sites: Secondary | ICD-10-CM

## 2020-07-27 DIAGNOSIS — M1990 Unspecified osteoarthritis, unspecified site: Secondary | ICD-10-CM | POA: Insufficient documentation

## 2020-07-27 DIAGNOSIS — M5410 Radiculopathy, site unspecified: Secondary | ICD-10-CM | POA: Diagnosis not present

## 2020-07-27 DIAGNOSIS — M25512 Pain in left shoulder: Secondary | ICD-10-CM

## 2020-07-27 DIAGNOSIS — M159 Polyosteoarthritis, unspecified: Secondary | ICD-10-CM

## 2020-07-27 DIAGNOSIS — G8929 Other chronic pain: Secondary | ICD-10-CM | POA: Diagnosis not present

## 2020-07-27 DIAGNOSIS — M542 Cervicalgia: Secondary | ICD-10-CM | POA: Diagnosis not present

## 2020-07-27 NOTE — Progress Notes (Signed)
Patient ID: [name], male    DOB: Mar 08, 1951, 70 y.o.   MRN: [id number]  This visit was conducted in person.  BP 122/72   Pulse 86   Temp 97.6 F (36.4 C) (Temporal)   Ht 5' 7.75" (1.721 m)   Wt 242 lb 1 oz (109.8 kg)   SpO2 96%   BMI 37.08 kg/m    CC: disability accommodation discussion.  Subjective:   HPI: [name] is a 70 y.o. male presenting on 07/27/2020 for Form Completion (Needs disability accomodation ppw completed.  Pt states form was faxed back to Dr. Darnell Level to sign.  Also, pt provided a log [to keep] of recent BS readings. )   I received request from apartment complex for reasonable accommodation verification.   He is requesting higher commode/toilet in his apartment due to known osteoarthritis. He has difficulty getting off his current low commode - this increases fall and injury risk.   He has had multiple orthopedic procedures for osteoarthritis as per below surgical history He has known aortic atherosclerosis as well as diabetes and prostate cancer - doing well from this standpoint. He has nonobstructive CAD. Recent CTA stable monitoring known dilated aortic root.  He is not disabled from [facility] standpoint.   Brings cbg log - largely well controlled fasting readings 90-120s, few elevated >160.  Lab Results  Component Value Date   HGBA1C 7.5 (A) 05/13/2020        Past Surgical History:  Procedure Laterality Date  . ANTERIOR CERVICAL DECOMP/DISCECTOMY FUSION  06-24-2008  and 10-28-2008 @[facility]    06-25-2008  C7 -- T1;    10-28-2008  C5 -- C7  . CARDIAC CATHETERIZATION  08-24-2002   dr gamble  @[facility]    normal coronaries  . CARDIAC CATHETERIZATION  12-31-2007    dr Johnsie Cancel   nonobstructive CAD  . CARPAL TUNNEL RELEASE Right 2000  approx.  Marland Kitchen CATARACT EXTRACTION W/ INTRAOCULAR LENS  IMPLANT, BILATERAL  2016  . COLONOSCOPY  10/11/2011   severe diverticulosis, int hemorrhoids, rec rpt 5 yrs Carlean Purl)  . COLONOSCOPY  12/2018   benign rectal polyp Carlean Purl)  .  DIRECT LARYNGOSCOPY  11/02/2008   @[facility]    Extubation under anesthesia post op complication from ACDF  . ESOPHAGOGASTRODUODENOSCOPY  10/11/2011   WNL Carlean Purl)  . ESOPHAGOGASTRODUODENOSCOPY  09/11-2018   gastric ulcer, gastritis, dilated esophageal stenosis, limit NSAIDs continue PPI, no f/u planned Carlean Purl)  . EXCISION MASS NECK N/A 08/29/2016   Procedure: EXCISION MASS POSTERIOR NECK;  Surgeon: Erroll Luna, MD;  Location: [place];  Service: General;  Laterality: N/A;  . HEMATOMA EVACUATION  10/29/2008   @[facility]    post op ACDF 10-28-2008  . LAPAROSCOPIC INGUINAL HERNIA REPAIR Bilateral 11-22-2006   @[facility]    AND EXCISION RLQ ABDOMINAL LIPOMA  . LEFT HEART CATH AND CORONARY ANGIOGRAPHY N/A 08/29/2018   Procedure: LEFT HEART CATH AND CORONARY ANGIOGRAPHY;  Surgeon: Jettie Booze, MD;  Location: [facility];  Service: Cardiovascular;  Laterality: N/A;  . RADIOACTIVE SEED IMPLANT N/A 07/03/2019   Procedure: RADIOACTIVE SEED IMPLANT/BRACHYTHERAPY IMPLANT;  Surgeon: Ardis Hughs, MD;  Location: [facility];  Service: Urology;  Laterality: N/A;  . SHOULDER ARTHROSCOPY WITH SUBACROMIAL DECOMPRESSION AND OPEN ROTATOR C Left 01/23/2013   Procedure: LEFT SHOULDER ARTHROSCOPY WITH SUBACROMIAL DECOMPRESSION AND MINI OPEN ROTATOR CUFF REPAIR, AND OPEN DISTAL CLAVICLE RESECTION;  Surgeon: Augustin Schooling, MD;  Location: [place];  Service: Orthopedics;  Laterality: Left;  . SHOULDER  SURGERY Right 05/2011  . SPACE OAR INSTILLATION N/A 07/03/2019   Procedure: SPACE OAR INSTILLATION;  Surgeon: Ardis Hughs, MD;  Location: [facility];  Service: Urology;  Laterality: N/A;  . TONSILLECTOMY  child  . TRIGGER FINGER RELEASE Right 12-12-2005   @[facility]    Right long finger    Relevant past medical, surgical, family and social history reviewed and updated as indicated. Interim medical history since our last visit reviewed. Allergies and medications reviewed  and updated. Outpatient Medications Prior to Visit  Medication Sig Dispense Refill  . ammonium lactate (AMLACTIN) 12 % cream Apply topically in the morning and at bedtime.    Marland Kitchen aspirin EC 81 MG tablet Take 81 mg by mouth daily.    Marland Kitchen atorvastatin (LIPITOR) 80 MG tablet TAKE 1 TABLET BY MOUTH  DAILY (Patient taking differently: Take 80 mg by mouth daily.) 90 tablet 3  . baclofen (LIORESAL) 10 MG tablet TAKE 1 TABLET BY MOUTH AT  BEDTIME AS NEEDED FOR  MUSCLE SPASM(S) 90 tablet 0  . carvedilol (COREG) 3.125 MG tablet TAKE 1 TABLET BY MOUTH  TWICE DAILY WITH A MEAL (Patient taking differently: Take 3.125 mg by mouth 2 (two) times daily with a meal.) 180 tablet 3  . fluticasone (FLONASE) 50 MCG/ACT nasal spray USE 2 SPRAYS IN BOTH  NOSTRILS DAILY 48 g 3  . gabapentin (NEURONTIN) 100 MG capsule TAKE 1 CAPSULE BY MOUTH TWO TIMES DAILY 180 capsule 3  . glipiZIDE (GLUCOTROL XL) 5 MG 24 hr tablet TAKE 1 TABLET BY MOUTH  DAILY WITH BREAKFAST 90 tablet 3  . glucose blood (ONETOUCH ULTRA) test strip USE WITH METER TO CHECK  BLOOD SUGAR TWICE DAILY 200 strip 3  . guaiFENesin (MUCINEX) 600 MG 12 hr tablet Take 600 mg by mouth 2 (two) times daily as needed for cough.    Marland Kitchen JARDIANCE 10 MG TABS tablet TAKE 1 TABLET(10 MG) BY MOUTH DAILY BEFORE BREAKFAST 20 tablet 0  . losartan (COZAAR) 50 MG tablet TAKE 1 TABLET BY MOUTH  DAILY 90 tablet 2  . metFORMIN (GLUCOPHAGE) 1000 MG tablet Take 1 tablet (1,000 mg total) by mouth 2 (two) times daily with a meal. 180 tablet 3  . omeprazole (PRILOSEC) 40 MG capsule TAKE 1 CAPSULE(40 MG) BY MOUTH DAILY 90 capsule 1  . oxyCODONE-acetaminophen (PERCOCET) 10-325 MG tablet Take 1 tablet by mouth every 4 (four) hours as needed for pain. (Patient taking differently: Take 1 tablet by mouth every 4 (four) hours.) 10 tablet 0  . polyvinyl alcohol (LIQUIFILM TEARS) 1.4 % ophthalmic solution Place 1 drop into both eyes as needed for dry eyes.     Facility-Administered Medications Prior to  Visit  Medication Dose Route Frequency Provider Last Rate Last Admin  . triamcinolone (KENALOG) 0.1 % cream   Topical BID Wallene Huh, DPM         Per HPI unless specifically indicated in ROS section below Review of Systems Objective:  BP 122/72   Pulse 86   Temp 97.6 F (36.4 C) (Temporal)   Ht 5' 7.75" (1.721 m)   Wt 242 lb 1 oz (109.8 kg)   SpO2 96%   BMI 37.08 kg/m   Wt Readings from Last 3 Encounters:  07/27/20 242 lb 1 oz (109.8 kg)  07/26/20 244 lb (110.7 kg)  05/13/20 241 lb 4 oz (109.4 kg)      Physical Exam Vitals and nursing note reviewed.  Constitutional:      Appearance: Normal appearance.  He is not ill-appearing.  Neurological:     Mental Status: He is alert.  Psychiatric:        Mood and Affect: Mood normal.        Behavior: Behavior normal.       Assessment & Plan:  This visit occurred during the SARS-CoV-2 public health emergency.  Safety protocols were in place, including screening questions prior to the visit, additional usage of staff PPE, and extensive cleaning of exam room while observing appropriate contact time as indicated for disinfecting solutions.   Problem List Items Addressed This Visit    Back pain with radiculopathy   Neck pain, chronic   Shoulder pain   Osteoarthritis - Primary    Known osteoarthritis of back, knees, shoulders, neck affecting ability to get on and off commode safely. He would benefit from higher commode seat.  Apartment disability accommodation request form filled out.           No orders of the defined types were placed in this encounter.  No orders of the defined types were placed in this encounter.   Patient Instructions  Return in 3 months for wellness visit/physical (after 11/02/2020).  Forms filled out today.    Follow up plan: Return in about 3 months (around 10/30/2020) for annual exam, prior fasting for blood work, medicare wellness visit.  Ria Bush, MD

## 2020-07-27 NOTE — Assessment & Plan Note (Addendum)
Known osteoarthritis of back, knees, shoulders, neck affecting ability to get on and off commode safely. He would benefit from higher commode seat.  Apartment disability accommodation request form filled out.

## 2020-07-27 NOTE — Patient Instructions (Addendum)
Return in 3 months for wellness visit/physical (after 11/02/2020).  Forms filled out today.

## 2020-07-29 ENCOUNTER — Ambulatory Visit: Payer: Medicare Other | Admitting: Podiatry

## 2020-07-29 ENCOUNTER — Other Ambulatory Visit: Payer: Self-pay

## 2020-07-29 ENCOUNTER — Encounter: Payer: Self-pay | Admitting: Podiatry

## 2020-07-29 DIAGNOSIS — E1142 Type 2 diabetes mellitus with diabetic polyneuropathy: Secondary | ICD-10-CM | POA: Diagnosis not present

## 2020-07-29 DIAGNOSIS — M79675 Pain in left toe(s): Secondary | ICD-10-CM

## 2020-07-29 DIAGNOSIS — B351 Tinea unguium: Secondary | ICD-10-CM | POA: Diagnosis not present

## 2020-07-29 DIAGNOSIS — M79674 Pain in right toe(s): Secondary | ICD-10-CM | POA: Diagnosis not present

## 2020-07-29 DIAGNOSIS — L853 Xerosis cutis: Secondary | ICD-10-CM | POA: Diagnosis not present

## 2020-07-29 NOTE — Progress Notes (Signed)
  Subjective:  Patient ID: [name], male    DOB: 1950-11-14,  MRN: [id number]  Chief Complaint  Patient presents with  . Nail Problem    Nail trim    70 y.o. male returns for the above complaint.  Patient presents with thickened elongated dystrophic toenails x10.  Patient states is painful to touch.  Patient would like to have them debrided down.  He states that the dry skin has improved considerably.  He states is been applying the ammonium lactate lotion.  He denies any other acute complaints.  Objective:  There were no vitals filed for this visit. Podiatric Exam: Vascular: dorsalis pedis and posterior tibial pulses are palpable bilateral. Capillary return is immediate. Temperature gradient is WNL. Skin turgor WNL  Sensorium: Normal Semmes Weinstein monofilament test. Normal tactile sensation bilaterally. Nail Exam: Pt has thick disfigured discolored nails with subungual debris noted bilateral entire nail hallux through fifth toenails.  Pain on palpation to the nails. Ulcer Exam: There is no evidence of ulcer or pre-ulcerative changes or infection. Orthopedic Exam: Muscle tone and strength are WNL. No limitations in general ROM. No crepitus or effusions noted. HAV  B/L.  Hammer toes 2-5  B/L. Skin: No Porokeratosis. No infection or ulcers.  Xerosis improving.Marland Kitchen  No fissuring noted no clinical signs of infection noted    Assessment & Plan:   1. Xerosis cutis   2. Diabetic polyneuropathy associated with type 2 diabetes mellitus (HCC)   3. Pain due to onychomycosis of toenails of both feet     Patient was evaluated and treated and all questions answered.  Xerosis bilateral lower extremity -Clinically improving.  I have asked him to continue applying ammonium lactate lotion twice a day.  He states understanding.  Onychomycosis with pain  -Nails palliatively debrided as below. -Educated on self-care  Procedure: Nail Debridement Rationale: pain  Type of Debridement:  manual, sharp debridement. Instrumentation: Nail nipper, rotary burr. Number of Nails: 10  Procedures and Treatment: Consent by patient was obtained for treatment procedures. The patient understood the discussion of treatment and procedures well. All questions were answered thoroughly reviewed. Debridement of mycotic and hypertrophic toenails, 1 through 5 bilateral and clearing of subungual debris. No ulceration, no infection noted.  Return Visit-Office Procedure: Patient instructed to return to the office for a follow up visit 3 months for continued evaluation and treatment.  [name], DPM    No follow-ups on file.

## 2020-08-02 ENCOUNTER — Other Ambulatory Visit: Payer: Self-pay | Admitting: Family Medicine

## 2020-08-03 DIAGNOSIS — H26491 Other secondary cataract, right eye: Secondary | ICD-10-CM | POA: Diagnosis not present

## 2020-08-17 DIAGNOSIS — H26492 Other secondary cataract, left eye: Secondary | ICD-10-CM | POA: Diagnosis not present

## 2020-08-29 ENCOUNTER — Telehealth: Payer: Self-pay

## 2020-08-29 NOTE — Chronic Care Management (AMB) (Addendum)
Chronic Care Management Pharmacy Assistant   Name: [name]  MRN: [id number] DOB: March 06, 1951  Reason for Encounter: Reminder Call/Chart Review   Recent office visits:  07/27/2020  [name]  PCP  No medication changes f/u 3 months  Recent consult visits:  07/29/2020  [name]  DPM   No medication changes f/u 3 months 07/26/2020  Dr[name]    No medication changes f/u 12 months  Hospital visits:  None in previous 6 months  Medications: Outpatient Encounter Medications as of 08/29/2020  Medication Sig   ammonium lactate (AMLACTIN) 12 % cream Apply topically in the morning and at bedtime.   aspirin EC 81 MG tablet Take 81 mg by mouth daily.   atorvastatin (LIPITOR) 80 MG tablet TAKE 1 TABLET BY MOUTH  DAILY (Patient taking differently: Take 80 mg by mouth daily.)   baclofen (LIORESAL) 10 MG tablet TAKE 1 TABLET BY MOUTH AT  BEDTIME AS NEEDED FOR  MUSCLE SPASM(S)   carvedilol (COREG) 3.125 MG tablet TAKE 1 TABLET BY MOUTH  TWICE DAILY WITH A MEAL (Patient taking differently: Take 3.125 mg by mouth 2 (two) times daily with a meal.)   fluticasone (FLONASE) 50 MCG/ACT nasal spray USE 2 SPRAYS IN BOTH  NOSTRILS DAILY   gabapentin (NEURONTIN) 100 MG capsule TAKE 1 CAPSULE BY MOUTH TWO TIMES DAILY   glipiZIDE (GLUCOTROL XL) 5 MG 24 hr tablet TAKE 1 TABLET BY MOUTH  DAILY WITH BREAKFAST   glucose blood (ONETOUCH ULTRA) test strip USE WITH METER TO CHECK  BLOOD SUGAR TWICE DAILY   guaiFENesin (MUCINEX) 600 MG 12 hr tablet Take 600 mg by mouth 2 (two) times daily as needed for cough.   JARDIANCE 10 MG TABS tablet TAKE 1 TABLET BY MOUTH  DAILY   losartan (COZAAR) 50 MG tablet TAKE 1 TABLET BY MOUTH  DAILY   metFORMIN (GLUCOPHAGE) 1000 MG tablet Take 1 tablet (1,000 mg total) by mouth 2 (two) times daily with a meal.   omeprazole (PRILOSEC) 40 MG capsule TAKE 1 CAPSULE(40 MG) BY MOUTH DAILY   oxyCODONE-acetaminophen (PERCOCET) 10-325 MG tablet Take 1 tablet by  mouth every 4 (four) hours as needed for pain. (Patient taking differently: Take 1 tablet by mouth every 4 (four) hours.)   polyvinyl alcohol (LIQUIFILM TEARS) 1.4 % ophthalmic solution Place 1 drop into both eyes as needed for dry eyes.   Facility-Administered Encounter Medications as of 08/29/2020  Medication   triamcinolone (KENALOG) 0.1 % cream    [name] was contacted to remind him of his upcoming telephone visit with [name] on 09/05/2020 at 1:00pm  he was reminded to have all medications, supplements and any blood glucose and blood pressure readings available for review at appointment.   Are you having any problems with your medications? No the patient uses mail order for all medications.  What concerns would you like to discuss with the pharmacist? None identified.    Star Rating Drugs: Medication:  Last Fill: Day Supply Atorvastatin 80mg  06/16/2020 90ds Glipizide  5 mg 06/16/2020 - Jardiance 10mg  08/02/2020 - Losartan 50mg  06/16/2020 - Metformin 1000mg  06/16/2020 -    [name], CPP notified  [name], [place] Assistant [phone]  I have reviewed the care management and care coordination activities outlined in this encounter and I am certifying that I agree with the content of this note. No further action required.  [name], PharmD Clinical Pharmacist [facility] [phone]

## 2020-09-05 ENCOUNTER — Other Ambulatory Visit: Payer: Self-pay

## 2020-09-05 ENCOUNTER — Ambulatory Visit (INDEPENDENT_AMBULATORY_CARE_PROVIDER_SITE_OTHER): Payer: Medicare Other

## 2020-09-05 DIAGNOSIS — E1169 Type 2 diabetes mellitus with other specified complication: Secondary | ICD-10-CM

## 2020-09-05 DIAGNOSIS — E118 Type 2 diabetes mellitus with unspecified complications: Secondary | ICD-10-CM | POA: Diagnosis not present

## 2020-09-05 DIAGNOSIS — E785 Hyperlipidemia, unspecified: Secondary | ICD-10-CM | POA: Diagnosis not present

## 2020-09-05 DIAGNOSIS — IMO0002 Reserved for concepts with insufficient information to code with codable children: Secondary | ICD-10-CM

## 2020-09-05 DIAGNOSIS — E1165 Type 2 diabetes mellitus with hyperglycemia: Secondary | ICD-10-CM

## 2020-09-05 DIAGNOSIS — I10 Essential (primary) hypertension: Secondary | ICD-10-CM | POA: Diagnosis not present

## 2020-09-05 NOTE — Progress Notes (Signed)
Chronic Care Management Pharmacy Note  09/05/2020 Name:  [name] MRN:  [id number] DOB:  08/21/1950  Subjective: [name] is an 70 y.o. year old male who is a primary patient of Ria Bush, MD.  The CCM team was consulted for assistance with disease management and care coordination needs.    Engaged with patient by telephone for follow up visit in response to provider referral for pharmacy case management and/or care coordination services. Pt reports primary health concern is blood in urine. He has an appt for labs tomorrow (5/17) and urologist next week (5/24) with urology, Dr. Louis Meckel.  Consent to Services:  The patient was given information about Chronic Care Management services, agreed to services, and gave verbal consent prior to initiation of services.  Please see initial visit note for detailed documentation.   Patient Care Team: Ria Bush, MD as PCP - General (Family Medicine) Josue Hector, MD as PCP - Cardiology (Cardiology) Tonia Ghent, MD (Family Medicine) Shirley Muscat Loreen Freud, MD as Referring Physician (Optometry) Netta Cedars, MD as Consulting Physician (Orthopedic Surgery) Melina Schools, MD as Consulting Physician (Orthopedic Surgery) Ardis Hughs, MD as Consulting Physician (Urology) Tyler Pita, MD as Consulting Physician (Radiation Oncology) Cira Rue, RN Nurse Navigator as Registered Nurse ([place]) [name], [facility] as Pharmacist (Pharmacist)  Recent office visits:  07/27/2020  [name], PCP -  No medication changes f/u 3 months  Recent consult visits:  07/29/2020  [name], DPM - No medication changes f/u 3 months 07/26/2020  Dr.Peter Johnsie Cancel, Cardiology - No medication changes f/u 12 months  Hospital visits:  None in previous 6 months  Objective:  Lab Results  Component Value Date   CREATININE 0.93 07/22/2020   BUN 11 07/22/2020   GFR 97.28 10/30/2019   GFRNONAA >60 03/07/2020    GFRAA >60 12/13/2019   NA 141 07/22/2020   K 3.7 07/22/2020   CALCIUM 8.9 07/22/2020   CO2 23 07/22/2020   GLUCOSE 145 (H) 07/22/2020    Lab Results  Component Value Date/Time   HGBA1C 7.5 (A) 05/13/2020 12:07 PM   HGBA1C 7.4 (A) 03/09/2020 02:48 PM   HGBA1C 7.3 (H) 10/30/2019 07:53 AM   HGBA1C 9.2 (H) 07/28/2018 11:41 AM   FRUCTOSAMINE 242 10/16/2018 11:25 AM   GFR 97.28 10/30/2019 07:53 AM   GFR 125.07 10/16/2018 11:25 AM   MICROALBUR 3.2 (H) 06/16/2012 09:23 AM   MICROALBUR 0.1 10/24/2010 08:01 AM    Last diabetic Eye exam:  Lab Results  Component Value Date/Time   HMDIABEYEEXA No Retinopathy 06/03/2018 12:00 AM    Last diabetic Foot exam: 07/29/20 podiatry  Lab Results  Component Value Date   CHOL 105 10/30/2019   HDL 30.70 (L) 10/30/2019   LDLCALC 61 10/30/2019   LDLDIRECT 104 (H) 12/26/2012   TRIG 67.0 10/30/2019   CHOLHDL 3 10/30/2019    Hepatic Function Latest Ref Rng & Units 03/07/2020 10/30/2019 06/30/2019  Total Protein 6.5 - 8.1 g/dL 7.5 7.1 7.1  Albumin 3.5 - 5.0 g/dL 4.0 4.3 3.8  AST 15 - 41 U/L _0 ALT 0 - 44 U/L _1 Alk Phosphatase 38 - 126 U/L 47 52 54  Total Bilirubin 0.3 - 1.2 mg/dL 0.5 0.4 0.3  Bilirubin, Direct 0.0 - 0.3 mg/dL - - -    Lab Results  Component Value Date/Time   TSH 1.60 10/30/2019 07:53 AM   TSH 1.502 12/26/2012 02:43 PM    CBC Latest Ref  Rng & Units 03/07/2020 12/13/2019 10/30/2019  WBC 4.0 - 10.5 K/uL 5.0 5.4 4.7  Hemoglobin 13.0 - 17.0 g/dL 13.3 13.6 13.6  Hematocrit 39.0 - 52.0 % 40.7 41.0 40.5  Platelets 150 - 400 K/uL 263 261 265.0    No results found for: VD25OH  Clinical ASCVD: Yes  The ASCVD Risk score Mikey Bussing DC Jr., et al., 2013) failed to calculate for the following reasons:   The valid total cholesterol range is 130 to 320 mg/dL    Depression screen [facility] 2/9 10/29/2019 10/16/2018 08/13/2018  Decreased Interest 0 0 0  Down, Depressed, Hopeless 0 0 0  PHQ - 2 Score 0 0 0  Altered sleeping 0 0 -   Tired, decreased energy 0 0 -  Change in appetite 0 0 -  Feeling bad or failure about yourself  0 0 -  Trouble concentrating 0 0 -  Moving slowly or fidgety/restless 0 0 -  Suicidal thoughts 0 0 -  PHQ-9 Score 0 0 -  Difficult doing work/chores Not difficult at all Not difficult at all -  Some recent data might be hidden    Social History   Tobacco Use  Smoking Status Never Smoker  Smokeless Tobacco Never Used   BP Readings from Last 3 Encounters:  07/27/20 122/72  07/26/20 112/70  05/13/20 124/82   Pulse Readings from Last 3 Encounters:  07/27/20 86  07/26/20 78  05/13/20 60   Wt Readings from Last 3 Encounters:  07/27/20 242 lb 1 oz (109.8 kg)  07/26/20 244 lb (110.7 kg)  05/13/20 241 lb 4 oz (109.4 kg)   BMI Readings from Last 3 Encounters:  07/27/20 37.08 kg/m  07/26/20 37.37 kg/m  05/13/20 36.95 kg/m    Assessment/Interventions: Review of patient past medical history, allergies, medications, health status, including review of consultants reports, laboratory and other test data, was performed as part of comprehensive evaluation and provision of chronic care management services.   SDOH:  (Social Determinants of Health) assessments and interventions performed: Yes  SDOH Screenings   Alcohol Screen: Low Risk   . Last Alcohol Screening Score (AUDIT): 0  Depression (PHQ2-9): Low Risk   . PHQ-2 Score: 0  Financial Resource Strain: Medium Risk  . Difficulty of Paying Living Expenses: Somewhat hard  Food Insecurity: No Food Insecurity  . Worried About Charity fundraiser in the Last Year: Never true  . Ran Out of Food in the Last Year: Never true  Housing: Low Risk   . Last Housing Risk Score: 0  Physical Activity: Inactive  . Days of Exercise per Week: 0 days  . Minutes of Exercise per Session: 0 min  Social Connections: Not on file  Stress: No Stress Concern Present  . Feeling of Stress : Not at all  Tobacco Use: Low Risk   . Smoking Tobacco Use: Never  Smoker  . Smokeless Tobacco Use: Never Used  Transportation Needs: No Transportation Needs  . Lack of Transportation (Medical): No  . Lack of Transportation (Non-Medical): No    CCM Care Plan  Allergies  Allergen Reactions  . Glimepiride Other (See Comments)    Headache, back pain  . Menthol (Topical Analgesic) Rash    Burning rash    Medications Reviewed Today    Reviewed by [name], [facility] (Pharmacist) on 09/05/20 at 1333  Med List Status: <None>  Medication Order Taking? Sig Documenting Provider Last Dose Status Informant  ammonium lactate (AMLACTIN) 12 % cream [id number]  Apply topically in  the morning and at bedtime. [provider]  Active Self  aspirin EC 81 MG tablet [id number] Yes Take 81 mg by mouth daily. Thurnell Lose, MD Taking Active Self  atorvastatin (LIPITOR) 80 MG tablet [id number] Yes TAKE 1 TABLET BY MOUTH  DAILY Ria Bush, MD Taking Active   baclofen (LIORESAL) 10 MG tablet [id number]  TAKE 1 TABLET BY MOUTH AT  BEDTIME AS NEEDED FOR  MUSCLE SPASM(S) Ria Bush, MD  Active   carvedilol (COREG) 3.125 MG tablet [id number] Yes TAKE 1 TABLET BY MOUTH  TWICE DAILY WITH A MEAL Ria Bush, MD Taking Active   fluticasone [facility]) 50 MCG/ACT nasal spray [id number]  USE 2 SPRAYS IN BOTH  NOSTRILS DAILY Ria Bush, MD  Active Self  gabapentin (NEURONTIN) 100 MG capsule [id number] Yes TAKE 1 CAPSULE BY MOUTH TWO TIMES DAILY Ria Bush, MD Taking Active   glipiZIDE (GLUCOTROL XL) 5 MG 24 hr tablet [id number] Yes TAKE 1 TABLET BY MOUTH  DAILY WITH Sander Radon, MD Taking Active   glucose blood [facility] ULTRA) test strip [id number]  USE WITH METER TO CHECK  BLOOD SUGAR TWICE DAILY Ria Bush, MD  Active Self  guaiFENesin (MUCINEX) 600 MG 12 hr tablet [id number]  Take 600 mg by mouth 2 (two) times daily as needed for cough. [provider]  Active Self  JARDIANCE 10 MG TABS tablet [id number] Yes TAKE 1  TABLET BY MOUTH  DAILY Ria Bush, MD Taking Active   losartan (COZAAR) 50 MG tablet [id number]  TAKE 1 TABLET BY MOUTH  DAILY Ria Bush, MD  Active   metFORMIN (GLUCOPHAGE) 1000 MG tablet [id number] Yes Take 1 tablet (1,000 mg total) by mouth 2 (two) times daily with a meal. Ria Bush, MD Taking Active   omeprazole (PRILOSEC) 40 MG capsule [id number]  TAKE 1 CAPSULE(40 MG) BY MOUTH DAILY Ria Bush, MD  Active   oxyCODONE-acetaminophen (PERCOCET) 10-325 MG tablet [id number]  Take 1 tablet by mouth every 4 (four) hours as needed for pain.  Patient taking differently: Take 1 tablet by mouth every 4 (four) hours.   Ardis Hughs, MD  Active   polyvinyl alcohol (LIQUIFILM TEARS) 1.4 % ophthalmic solution [id number]  Place 1 drop into both eyes as needed for dry eyes. [provider]  Active Self  triamcinolone (KENALOG) 0.1 % cream [id number]   Wallene Huh, DPM  Active           Patient Active Problem List   Diagnosis Date Noted  . Osteoarthritis 07/27/2020  . Acquired dilation of ascending aorta and aortic root ([place])   . NSAID-induced gastric ulcer 01/07/2019  . Pain due to onychomycosis of toenails of both feet 10/15/2018  . CAD (coronary artery disease) 09/03/2018  . Abnormal stress test   . Chest pain 08/25/2018  . Tongue lesion 08/05/2018  . Fatty liver 07/31/2018  . Lightheadedness 08/06/2017  . Erectile dysfunction 08/01/2015  . Health maintenance examination 01/31/2015  . Gross hematuria   . Lipoma of neck 10/01/2014  . Allergic rhinitis 10/01/2014  . Diabetic neuropathy ([place]) 05/31/2014  . Advanced care planning/counseling discussion 01/26/2014  . OSA (obstructive sleep apnea)   . Tinea cruris 10/08/2012  . Malignant neoplasm of prostate ([place]) 06/16/2012  . Severe obesity (BMI 35.0-39.9) with comorbidity ([place]) 01/08/2012  . Esophageal dysphagia 10/10/2011  . Diverticulosis 10/09/2011  . 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  Insomnia 06/19/2011  . Shoulder pain 04/08/2011  . Neck pain, chronic 02/24/2011  . Medicare annual  wellness visit, subsequent 10/26/2010  . Iron deficiency anemia 07/01/2009  . GERD 09/29/2008  . Essential hypertension 01/01/2008  . ANXIETY DEPRESSION 03/19/2007  . TIA (transient ischemic attack) 03/05/2007  . Back pain with radiculopathy 12/31/2006  . ABDOMINAL WALL HERNIA 10/03/2006  . Diabetes mellitus type 2, uncontrolled, with complications ([place]) 54/27/0623  . History of cocaine use 10/01/2006  . Hyperlipidemia associated with type 2 diabetes mellitus ([place]) 08/21/2005    Immunization History  Administered Date(s) Administered  . Fluad Quad(high Dose 65+) 02/03/2019, 03/09/2020  . Influenza Split 02/06/2011, 01/08/2012  . Influenza Whole 03/19/2007, 02/19/2008, 01/27/2009, 01/25/2010  . Influenza, High Dose Seasonal PF 03/05/2018  . Influenza,inj,Quad PF,6+ Mos 02/20/2013, 01/26/2014, 01/31/2015, 02/02/2016, 03/04/2017  . Influenza,inj,quad, With Preservative 01/22/2019  . PFIZER(Purple Top)SARS-COV-2 Vaccination 08/22/2019, 09/12/2019  . Pneumococcal Conjugate-13 08/01/2015  . Pneumococcal Polysaccharide-23 06/16/2012, 04/30/2019  . Td 04/24/2003  . Tdap 01/26/2014  . Zoster 12/15/2010    Conditions to be addressed/monitored:  Hypertension, Hyperlipidemia and Diabetes  Care Plan : [place]  Updates made by [name], [facility] since 09/05/2020 12:00 AM    Problem: Hypertension, Hyperlipidemia, Diabetes and Coronary Artery Disease   Priority: High  Onset Date: 06/07/2020  Note:   Current Barriers:  . Unable to achieve control of diabetes   Pharmacist Clinical Goal(s):  Marland Kitchen Over the next 30 days, patient will achieve control of diabetes as evidenced by home BG within goal fasting 80-130 and post-prandial less than 180 through collaboration with PharmD and provider.   Interventions: . 1:1 collaboration with Ria Bush, MD regarding development and  update of comprehensive plan of care as evidenced by provider attestation and co-signature . Inter-disciplinary care team collaboration (see longitudinal plan of care) . Comprehensive medication review performed; medication list updated in electronic medical record  Hypertension (BP goal <140/90) -Controlled - per home BP readings  -Current treatment:  Carvedilol 3.150m - 1 twice daily  Losartan 593m- 1 daily -Medications previously tried: none  -Current home readings: using digital monitor with arm cuff   DATE:              BP               PULSE            -                        144/55             84             -                       117/55             64             -                       148/76             63             -                       121/77             69 -Current dietary habits:  Not following a particular diet but tries to limit salt  -Current exercise habits: None formal, tries to  get out and walk at the store, limits sedentary time  -Denies hypotensive/hypertensive symptoms -Counseled to continue monitoring BP at home weekly, document, and provide log at future appointments. -Recommended to continue current medication  Hyperlipidemia/CAD: (LDL goal < 70) -Controlled - LDL 51 -Current treatment: . Atorvastatin 80 mg - 1 tablet daily . Aspirin 81 mg - 1 tablet daily  -Medications previously tried: none -Reviewed adherence to statin and aspirin - confirms daily  -No updates/changes since CCM visit 06/07/20 -Recommended to continue current medication  Diabetes (A1c goal <7%) -Uncontrolled - A1c 7.6% -Current medications: Marland Kitchen Metformin 1000 mg - 1 tablet twice daily (breakfast and afternoon) . Jardiance 10 mg - 1 tablet before breakfast  . Glipizide 5 mg XL - 1 tablet daily with breakfast -Takes all three medications 30 minutes before breakfast. -Medications previously tried: previously on higher dose glipizide, decrease due to hypoglycemia -Current home  glucose readings: checks up to twice daily various times of day From 07/27/20-09/04/20: . Fasting glucose: 114, 159 . Before meals: 114, 56, 100, 135, 97 . After meals: 175, 142, 138  . Bedtime: 114 . Hypoglycemia: one episode this month - 56, he drank water with sugar in it. BG came up quickly. -Reports hypoglycemic symptoms occasionally with BG 90-110, pt did not pick up glucose tablets from pharmacy as recommended at previous visit.  -Current meal patterns: tries to watch portions/tries to eat balanced plate with baked meats, vegetables - green beans, turnip greens, onion, bell pepper. Limits potatoes/rice.  Marland Kitchen drinks: diet tea or diet soda, no juices -Current exercise: none formal -Educated on: A1c and blood sugar goals, preventing hypoglycemia -We discussed increasing Jardiance to 25 mg and stopping glipizide due to hypoglycemia. Pt denies med changes at this time as he feels he is doing well and BG have improved, agree. If further hypoglycemia and pending next A1c, recommend changing regimen as recommended. -We also discussed writing down before meal or after meal when keeping BG log as right now he just writes the time of day.  -Recommended to continue current medication; Please keep log of all low blood glucose readings (< 70) with any probable causes.   Neuropathy (Goal: Control symptoms) -Controlled -Current treatment  . Gabapentin 100 mg - 2 capsules at bedtime -Medications previously tried: none -Pt taking 2 at bedtime instead of twice daily due to daytime drowsiness.  -Recommended to continue current medication  Patient Goals/Self-Care Activities . Over the next 30 days, patient will:  - check glucose daily, document, and provide at future appointments - check blood pressure twice weekly , document, and provide at future appointments  Follow Up Plan: Telephone follow up appointment with care management team member scheduled for: 3 months follow up, 30 days assistant will call  for BP and BG log     Medication Assistance: None required.  Patient affirms current coverage meets needs.  Patient's preferred pharmacy is:  [facility] [place], [place] - [address] AT [place] [address] Phone: [phone] Fax: [phone]  [place], [address] [place], [address] [place], [place] [address] [place] [address] Phone: [phone] Fax: [phone]  [facility] [address], [place] [address] Phone: [phone] Fax: [phone]  Mail order for maintenance meds  Care Plan and Follow Up Patient Decision:  Patient agrees to Care Plan  and Follow-up.   [name], PharmD Clinical Pharmacist [facility] [phone]

## 2020-09-05 NOTE — Patient Instructions (Signed)
Dear [name],  Below is a summary of the goals we discussed during our follow up appointment on Sep 05, 2020. Please contact me anytime with questions or concerns.   Visit Information   Patient Care Plan: CCM Pharmacy Care Plan    Problem Identified: Hypertension, Hyperlipidemia, Diabetes and Coronary Artery Disease   Priority: High  Onset Date: 06/07/2020  Note:   Current Barriers:  . Unable to achieve control of diabetes   Pharmacist Clinical Goal(s):  Marland Kitchen Over the next 30 days, patient will achieve control of diabetes as evidenced by home BG within goal fasting 80-130 and post-prandial less than 180 through collaboration with PharmD and provider.   Interventions: . 1:1 collaboration with Ria Bush, MD regarding development and update of comprehensive plan of care as evidenced by provider attestation and co-signature . Inter-disciplinary care team collaboration (see longitudinal plan of care) . Comprehensive medication review performed; medication list updated in electronic medical record  Hypertension (BP goal <140/90) -Controlled - per home BP readings  -Current treatment:  Carvedilol 3.16m - 1 twice daily  Losartan 512m- 1 daily -Medications previously tried: none  -Current home readings: using digital monitor with arm cuff   DATE:              BP               PULSE            -                        144/55             84             -                       117/55             64             -                       148/76             63             -                       121/77             69 -Current dietary habits:  Not following a particular diet but tries to limit salt  -Current exercise habits: None formal, tries to get out and walk at the store, limits sedentary time  -Denies hypotensive/hypertensive symptoms -Counseled to continue monitoring BP at home weekly, document, and provide log at future appointments. -Recommended to continue current  medication  Hyperlipidemia/CAD: (LDL goal < 70) -Controlled - LDL 51 -Current treatment: . Atorvastatin 80 mg - 1 tablet daily . Aspirin 81 mg - 1 tablet daily  -Medications previously tried: none -Reviewed adherence to statin and aspirin - confirms daily  -No updates/changes since CCM visit 06/07/20 -Recommended to continue current medication  Diabetes (A1c goal <7%) -Uncontrolled - A1c 7.6% -Current medications: . Marland Kitchenetformin 1000 mg - 1 tablet twice daily (breakfast and afternoon) . Jardiance 10 mg - 1 tablet before breakfast  . Glipizide 5 mg XL - 1 tablet daily with breakfast -Takes all three medications 30 minutes before breakfast. -Medications previously tried: previously  on higher dose glipizide, decrease due to hypoglycemia -Current home glucose readings: checks up to twice daily various times of day From 07/27/20-09/04/20: . Fasting glucose: 114, 159 . Before meals: 114, 56, 100, 135, 97 . After meals: 175, 142, 138  . Bedtime: 114 . Hypoglycemia: one episode this month - 56, he drank water with sugar in it. BG came up quickly. -Reports hypoglycemic symptoms occasionally with BG 90-110, pt did not pick up glucose tablets from pharmacy as recommended at previous visit.  -Current meal patterns: tries to watch portions/tries to eat balanced plate with baked meats, vegetables - green beans, turnip greens, onion, bell pepper. Limits potatoes/rice.  Marland Kitchen drinks: diet tea or diet soda, no juices -Current exercise: none formal -Educated on: A1c and blood sugar goals, preventing hypoglycemia -We discussed increasing Jardiance to 25 mg and stopping glipizide due to hypoglycemia. Pt denies med changes at this time as he feels he is doing well and BG have improved, agree. If further hypoglycemia and pending next A1c, recommend changing regimen as recommended. -We also discussed writing down before meal or after meal when keeping BG log as right now he just writes the time of day.  -Recommended  to continue current medication; Please keep log of all low blood glucose readings (< 70) with any probable causes.   Neuropathy (Goal: Control symptoms) -Controlled -Current treatment  . Gabapentin 100 mg - 2 capsules at bedtime -Medications previously tried: none -Pt taking 2 at bedtime instead of twice daily due to daytime drowsiness.  -Recommended to continue current medication  Patient Goals/Self-Care Activities . Over the next 30 days, patient will:  - check glucose daily, document, and provide at future appointments - check blood pressure twice weekly , document, and provide at future appointments  Follow Up Plan: Telephone follow up appointment with care management team member scheduled for: 3 months follow up, 30 days assistant will call for BP and BG log      The patient verbalized understanding of instructions, educational materials, and care plan provided today and agreed to receive a mailed copy of patient instructions, educational materials, and care plan.   [name], PharmD Clinical Pharmacist [facility] [phone]   Preventing Hypoglycemia Hypoglycemia occurs when the level of sugar (glucose) in the blood is too low. Hypoglycemia can happen in people who do or do not have diabetes (diabetes mellitus). It can develop quickly, and it can be a medical emergency. For most people with diabetes, a blood glucose level below 70 mg/dL (3.9 mmol/L) is considered hypoglycemia. Glucose is a type of sugar that provides the body's main source of energy. Certain hormones (insulin and glucagon) control the level of glucose in the blood. Insulin lowers blood glucose, and glucagon increases blood glucose. Hypoglycemia can result from having too much insulin in the bloodstream, or from not eating enough food that contains glucose. Your risk for hypoglycemia is higher:  If you take insulin or diabetes medicines to help lower your blood glucose or help your  body make more insulin.  If you skip or delay a meal or snack.  If you are ill.  During and after exercise. You can prevent hypoglycemia by working with your health care provider to adjust your meal plan as needed and by taking other precautions. How can hypoglycemia affect me? Mild symptoms Mild hypoglycemia may not cause any symptoms. If you do have symptoms, they may include:  Hunger.  Anxiety.  Sweating and feeling clammy.  Dizziness or  feeling light-headed.  Sleepiness.  Nausea.  Increased heart rate.  Headache.  Blurry vision.  Irritability.  Tingling or numbness around the mouth, lips, or tongue.  A change in coordination.  Restless sleep. If mild hypoglycemia is not recognized and treated, it can quickly become moderate or severe hypoglycemia. Moderate symptoms Moderate hypoglycemia can cause:  Mental confusion and poor judgment.  Behavior changes.  Weakness.  Irregular heartbeat. Severe symptoms Severe hypoglycemia is a medical emergency. It can cause:  Fainting.  Seizures.  Loss of consciousness (coma).  Death. What nutrition changes can be made?  Work with your health care provider or diet and nutrition specialist (dietitian) to make a healthy meal plan that is right for you. Follow your meal plan carefully.  Eat meals at regular times.  If recommended by your health care provider, have snacks between meals.  Donot skip or delay meals or snacks. You can be at risk for hypoglycemia if you are not getting enough carbohydrates. What lifestyle changes can be made?  Work closely with your health care provider to manage your blood glucose. Make sure you know: ? Your goal blood glucose levels. ? How and when to check your blood glucose. ? The symptoms of hypoglycemia. It is important to treat it right away to keep it from becoming severe.  Do not drink alcohol on an empty stomach.  When you are ill, check your blood glucose more often  than usual. Follow your sick day plan whenever you cannot eat or drink normally. Make this plan in advance with your health care provider.  Always check your blood glucose before, during, and after exercise.   How is this treated? This condition can often be treated by immediately eating or drinking something that contains sugar, such as:  Fruit juice, 4-6 oz (120-150 mL).  Regular (not diet) soda, 4-6 oz (120-150 mL).  Low-fat milk, 4 oz (120 mL).  Several pieces of hard candy.  Sugar or honey, 1 Tbsp (15 mL). Treating hypoglycemia if you have diabetes If you are alert and able to swallow safely, follow the 15:15 rule:  Take 15 grams of a rapid-acting carbohydrate. Talk with your health care provider about how much you should take.  Rapid-acting options include: ? Glucose pills (take 15 grams). ? 6-8 pieces of hard candy. ? 4-6 oz (120-150 mL) of fruit juice. ? 4-6 oz (120-150 mL) of regular (not diet) soda.  Check your blood glucose 15 minutes after you take the carbohydrate.  If the repeat blood glucose level is still at or below 70 mg/dL (3.9 mmol/L), take 15 grams of a carbohydrate again.  If your blood glucose level does not increase above 70 mg/dL (3.9 mmol/L) after 3 tries, seek emergency medical care.  After your blood glucose level returns to normal, eat a meal or a snack within 1 hour. Treating severe hypoglycemia Severe hypoglycemia is when your blood glucose level is at or below 54 mg/dL (3 mmol/L). Severe hypoglycemia is a medical emergency. Get medical help right away. If you have severe hypoglycemia and you cannot eat or drink, you may need an injection of glucagon. A family member or close friend should learn how to check your blood glucose and how to give you a glucagon injection. Ask your health care provider if you need to have an emergency glucagon injection kit available. Severe hypoglycemia may need to be treated in a hospital. The treatment may include  getting glucose through an IV. You may also need treatment for  the cause of your hypoglycemia. Where to find more information  American Diabetes Association: www.diabetes.[employer] of Diabetes and Digestive and Kidney Diseases: [employer] Contact a health care provider if:  You have problems keeping your blood glucose in your target range.  You have frequent episodes of hypoglycemia. Get help right away if:  You continue to have hypoglycemia symptoms after eating or drinking something containing glucose.  Your blood glucose level is at or below 54 mg/dL (3 mmol/L).  You faint.  You have a seizure. These symptoms may represent a serious problem that is an emergency. Do not wait to see if the symptoms will go away. Get medical help right away. Call your local emergency services (911 in the [place] Summary  Know the symptoms of hypoglycemia, and when you are at risk for it (such as during exercise or when you are sick). Check your blood glucose often when you are at risk for hypoglycemia.  Hypoglycemia can develop quickly, and it can be dangerous if it is not treated right away. If you have a history of severe hypoglycemia, make sure you know how to use your glucagon injection kit.  Make sure you know how to treat hypoglycemia. Keep a carbohydrate snack available when you may be at risk for hypoglycemia. This information is not intended to replace advice given to you by your health care provider. Make sure you discuss any questions you have with your health care provider. Document Revised: 08/01/2018 Document Reviewed: 12/05/2016 Elsevier Patient Education  2021 [employer].

## 2020-09-06 NOTE — Progress Notes (Signed)
Encounter details: CCM Time Spent       Value Time User   Time spent with patient (minutes)  35 09/05/2020  1:52 PM [name], [facility]   Time spent performing Chart review  30 09/05/2020  1:52 PM [name], [facility]   Total time (minutes)  65 09/05/2020  1:52 PM [name], RPH      Moderate to High Complex Decision Making       Value Time User   Moderate to High complex decision making  Yes 09/05/2020  1:52 PM [name], [facility]      CCM Services: This encounter meets complex CCM services and moderate to high decision making.  Prior to outreach and patient consent for Chronic Care Management, I referred this patient for services after reviewing the nominated patient list or from a personal encounter with the patient.  I have personally reviewed this encounter including the documentation in this note and have collaborated with the care management provider regarding care management and care coordination activities to include development and update of the comprehensive care plan. I am certifying that I agree with the content of this note and encounter as supervising physician.

## 2020-09-13 DIAGNOSIS — R300 Dysuria: Secondary | ICD-10-CM | POA: Diagnosis not present

## 2020-10-10 DIAGNOSIS — R310 Gross hematuria: Secondary | ICD-10-CM | POA: Diagnosis not present

## 2020-10-12 ENCOUNTER — Ambulatory Visit (INDEPENDENT_AMBULATORY_CARE_PROVIDER_SITE_OTHER): Payer: Medicare Other | Admitting: Podiatry

## 2020-10-12 ENCOUNTER — Other Ambulatory Visit: Payer: Self-pay

## 2020-10-12 DIAGNOSIS — E1142 Type 2 diabetes mellitus with diabetic polyneuropathy: Secondary | ICD-10-CM | POA: Diagnosis not present

## 2020-10-12 DIAGNOSIS — M79675 Pain in left toe(s): Secondary | ICD-10-CM | POA: Diagnosis not present

## 2020-10-12 DIAGNOSIS — M79674 Pain in right toe(s): Secondary | ICD-10-CM | POA: Diagnosis not present

## 2020-10-12 DIAGNOSIS — B351 Tinea unguium: Secondary | ICD-10-CM

## 2020-10-13 ENCOUNTER — Other Ambulatory Visit: Payer: Self-pay | Admitting: Family Medicine

## 2020-10-13 DIAGNOSIS — E118 Type 2 diabetes mellitus with unspecified complications: Secondary | ICD-10-CM

## 2020-10-17 ENCOUNTER — Other Ambulatory Visit: Payer: Self-pay | Admitting: Family Medicine

## 2020-10-18 ENCOUNTER — Encounter: Payer: Self-pay | Admitting: Podiatry

## 2020-10-18 NOTE — Progress Notes (Signed)
  Subjective:  Patient ID: [name], male    DOB: May 17, 1950,  MRN: [id number]  Chief Complaint  Patient presents with   Nail Problem    Nail trim    70 y.o. male returns for the above complaint.  Patient presents with thickened elongated dystrophic toenails x10.  Patient states is painful to touch.  Patient would like to have them debrided down.  He denies any other acute complaints.  He is having hard time to debride his own toenails.  Objective:  There were no vitals filed for this visit. Podiatric Exam: Vascular: dorsalis pedis and posterior tibial pulses are palpable bilateral. Capillary return is immediate. Temperature gradient is WNL. Skin turgor WNL  Sensorium: Normal Semmes Weinstein monofilament test. Normal tactile sensation bilaterally. Nail Exam: Pt has thick disfigured discolored nails with subungual debris noted bilateral entire nail hallux through fifth toenails.  Pain on palpation to the nails. Ulcer Exam: There is no evidence of ulcer or pre-ulcerative changes or infection. Orthopedic Exam: Muscle tone and strength are WNL. No limitations in general ROM. No crepitus or effusions noted. HAV  B/L.  Hammer toes 2-5  B/L. Skin: No Porokeratosis. No infection or ulcers.  Xerosis improving.Marland Kitchen  No fissuring noted no clinical signs of infection noted    Assessment & Plan:   1. Diabetic polyneuropathy associated with type 2 diabetes mellitus (HCC)   2. Pain due to onychomycosis of toenails of both feet      Patient was evaluated and treated and all questions answered.  Xerosis bilateral lower extremity -Clinically improving.  I have asked him to continue applying ammonium lactate lotion twice a day.  He states understanding.  Onychomycosis with pain  -Nails palliatively debrided as below. -Educated on self-care  Procedure: Nail Debridement Rationale: pain  Type of Debridement: manual, sharp debridement. Instrumentation: Nail nipper, rotary burr. Number of  Nails: 10  Procedures and Treatment: Consent by patient was obtained for treatment procedures. The patient understood the discussion of treatment and procedures well. All questions were answered thoroughly reviewed. Debridement of mycotic and hypertrophic toenails, 1 through 5 bilateral and clearing of subungual debris. No ulceration, no infection noted.  Return Visit-Office Procedure: Patient instructed to return to the office for a follow up visit 3 months for continued evaluation and treatment.  [name], DPM    No follow-ups on file.

## 2020-10-22 ENCOUNTER — Other Ambulatory Visit: Payer: Self-pay | Admitting: Family Medicine

## 2020-10-22 DIAGNOSIS — D509 Iron deficiency anemia, unspecified: Secondary | ICD-10-CM

## 2020-10-22 DIAGNOSIS — E785 Hyperlipidemia, unspecified: Secondary | ICD-10-CM

## 2020-10-22 DIAGNOSIS — IMO0002 Reserved for concepts with insufficient information to code with codable children: Secondary | ICD-10-CM

## 2020-10-22 DIAGNOSIS — C61 Malignant neoplasm of prostate: Secondary | ICD-10-CM

## 2020-10-26 ENCOUNTER — Other Ambulatory Visit (INDEPENDENT_AMBULATORY_CARE_PROVIDER_SITE_OTHER): Payer: Medicare Other

## 2020-10-26 ENCOUNTER — Other Ambulatory Visit: Payer: Medicare Other

## 2020-10-26 ENCOUNTER — Other Ambulatory Visit: Payer: Self-pay

## 2020-10-26 DIAGNOSIS — D509 Iron deficiency anemia, unspecified: Secondary | ICD-10-CM | POA: Diagnosis not present

## 2020-10-26 DIAGNOSIS — E785 Hyperlipidemia, unspecified: Secondary | ICD-10-CM

## 2020-10-26 DIAGNOSIS — E1169 Type 2 diabetes mellitus with other specified complication: Secondary | ICD-10-CM | POA: Diagnosis not present

## 2020-10-26 DIAGNOSIS — E118 Type 2 diabetes mellitus with unspecified complications: Secondary | ICD-10-CM

## 2020-10-26 DIAGNOSIS — C61 Malignant neoplasm of prostate: Secondary | ICD-10-CM | POA: Diagnosis not present

## 2020-10-26 DIAGNOSIS — IMO0002 Reserved for concepts with insufficient information to code with codable children: Secondary | ICD-10-CM

## 2020-10-26 DIAGNOSIS — E1165 Type 2 diabetes mellitus with hyperglycemia: Secondary | ICD-10-CM

## 2020-10-26 LAB — CBC WITH DIFFERENTIAL/PLATELET
Basophils Absolute: 0 10*3/uL (ref 0.0–0.1)
Basophils Relative: 0.8 % (ref 0.0–3.0)
Eosinophils Absolute: 0.1 10*3/uL (ref 0.0–0.7)
Eosinophils Relative: 1.9 % (ref 0.0–5.0)
HCT: 39 % (ref 39.0–52.0)
Hemoglobin: 13.2 g/dL (ref 13.0–17.0)
Lymphocytes Relative: 27.7 % (ref 12.0–46.0)
Lymphs Abs: 1.2 10*3/uL (ref 0.7–4.0)
MCHC: 33.7 g/dL (ref 30.0–36.0)
MCV: 91.2 fl (ref 78.0–100.0)
Monocytes Absolute: 0.4 10*3/uL (ref 0.1–1.0)
Monocytes Relative: 8.5 % (ref 3.0–12.0)
Neutro Abs: 2.7 10*3/uL (ref 1.4–7.7)
Neutrophils Relative %: 61.1 % (ref 43.0–77.0)
Platelets: 237 10*3/uL (ref 150.0–400.0)
RBC: 4.28 Mil/uL (ref 4.22–5.81)
RDW: 15.3 % (ref 11.5–15.5)
WBC: 4.4 10*3/uL (ref 4.0–10.5)

## 2020-10-26 LAB — LIPID PANEL
Cholesterol: 106 mg/dL (ref 0–200)
HDL: 34.2 mg/dL — ABNORMAL LOW (ref >39.00)
LDL Cholesterol: 59 mg/dL (ref 0–99)
NonHDL: 71.77
Total CHOL/HDL Ratio: 3
Triglycerides: 62 mg/dL (ref 0.0–149.0)
VLDL: 12.4 mg/dL (ref 0.0–40.0)

## 2020-10-26 LAB — COMPREHENSIVE METABOLIC PANEL
ALT: 19 U/L (ref 0–53)
AST: 11 U/L (ref 0–37)
Albumin: 4.2 g/dL (ref 3.5–5.2)
Alkaline Phosphatase: 47 U/L (ref 39–117)
BUN: 12 mg/dL (ref 6–23)
CO2: 28 mEq/L (ref 19–32)
Calcium: 9.2 mg/dL (ref 8.4–10.5)
Chloride: 104 mEq/L (ref 96–112)
Creatinine, Ser: 0.95 mg/dL (ref 0.40–1.50)
GFR: 80.99 mL/min (ref >60.00)
Glucose, Bld: 113 mg/dL — ABNORMAL HIGH (ref 70–99)
Potassium: 4.2 mEq/L (ref 3.5–5.1)
Sodium: 141 mEq/L (ref 135–145)
Total Bilirubin: 0.3 mg/dL (ref 0.2–1.2)
Total Protein: 7 g/dL (ref 6.0–8.3)

## 2020-10-26 LAB — FERRITIN: Ferritin: 23.6 ng/mL (ref 22.0–322.0)

## 2020-10-26 LAB — HEMOGLOBIN A1C: Hgb A1c MFr Bld: 7.4 % — ABNORMAL HIGH (ref 4.6–6.5)

## 2020-10-26 LAB — PSA: PSA: 0.28 ng/mL (ref 0.10–4.00)

## 2020-11-01 ENCOUNTER — Telehealth: Payer: Self-pay

## 2020-11-01 ENCOUNTER — Ambulatory Visit: Payer: Medicare Other

## 2020-11-01 NOTE — Progress Notes (Deleted)
Subjective:   [name] is a 70 y.o. male who presents for Medicare Annual/Subsequent preventive examination.  Review of Systems: N/A     I connected with the patient today by telephone and verified that I am speaking with the correct person using two identifiers. Location patient: home Location nurse: work Persons participating in the telephone visit: patient, nurse.   I discussed the limitations, risks, security and privacy concerns of performing an evaluation and management service by telephone and the availability of in person appointments. I also discussed with the patient that there may be a patient responsible charge related to this service. The patient expressed understanding and verbally consented to this telephonic visit.              Objective:    There were no vitals filed for this visit. There is no height or weight on file to calculate BMI.  Advanced Directives 03/07/2020 12/13/2019 10/29/2019 07/16/2019 07/03/2019 05/12/2019 02/25/2019  Does Patient Have a Medical Advance Directive? No No No No No No No  Type of Advance Directive - - - - - - -  Does patient want to make changes to medical advance directive? - - - No - Patient declined - Yes (MAU/Ambulatory/Procedural Areas - Information given) -  Copy of [place] in Chart? - - - - - - -  Would patient like information on creating a medical advance directive? - - No - Patient declined No - Patient declined No - Patient declined Yes (MAU/Ambulatory/Procedural Areas - Information given) No - Patient declined  Pre-existing out of facility DNR order (yellow form or pink MOST form) - - - - - - -    Current Medications (verified) Outpatient Encounter Medications as of 11/01/2020  Medication Sig   ammonium lactate (AMLACTIN) 12 % cream Apply topically in the morning and at bedtime.   aspirin EC 81 MG tablet Take 81 mg by mouth daily.   atorvastatin (LIPITOR) 80 MG tablet TAKE 1 TABLET BY MOUTH   DAILY   baclofen (LIORESAL) 10 MG tablet TAKE 1 TABLET BY MOUTH AT  BEDTIME AS NEEDED FOR  MUSCLE SPASM   carvedilol (COREG) 3.125 MG tablet TAKE 1 TABLET BY MOUTH  TWICE DAILY WITH A MEAL   fluticasone (FLONASE) 50 MCG/ACT nasal spray USE 2 SPRAYS IN BOTH  NOSTRILS DAILY   gabapentin (NEURONTIN) 100 MG capsule TAKE 1 CAPSULE BY MOUTH TWO TIMES DAILY   glipiZIDE (GLUCOTROL XL) 5 MG 24 hr tablet TAKE 1 TABLET BY MOUTH  DAILY WITH BREAKFAST   guaiFENesin (MUCINEX) 600 MG 12 hr tablet Take 600 mg by mouth 2 (two) times daily as needed for cough.   JARDIANCE 10 MG TABS tablet TAKE 1 TABLET BY MOUTH  DAILY   losartan (COZAAR) 50 MG tablet TAKE 1 TABLET BY MOUTH  DAILY   metFORMIN (GLUCOPHAGE) 1000 MG tablet Take 1 tablet (1,000 mg total) by mouth 2 (two) times daily with a meal.   omeprazole (PRILOSEC) 40 MG capsule TAKE 1 CAPSULE BY MOUTH  DAILY   ONETOUCH ULTRA test strip USE WITH METER TO CHECK  BLOOD SUGAR TWICE DAILY   oxyCODONE-acetaminophen (PERCOCET) 10-325 MG tablet Take 1 tablet by mouth every 4 (four) hours as needed for pain. (Patient taking differently: Take 1 tablet by mouth every 4 (four) hours.)   polyvinyl alcohol (LIQUIFILM TEARS) 1.4 % ophthalmic solution Place 1 drop into both eyes as needed for dry eyes.   Facility-Administered Encounter Medications as of 11/01/2020  Medication   triamcinolone (KENALOG) 0.1 % cream    Allergies (verified) Glimepiride and Menthol (topical analgesic)   History: Past Medical History:  Diagnosis Date   Acquired dilation of ascending aorta and aortic root (HCC)    74mm ascending aorta   Allergic rhinitis    Anxiety    Atherosclerosis of aorta (HCC)    CAD (coronary artery disease) cardiologist-- dr Tressia Miners turner   long hx atypical chest pain--- cardiac cath 08-24-2002 in epic showed normal coronaries;   03-17-2007  nuclear stress test showed ?small infarct , ef 60%;  cath done 12-31-2007 for recurrent CP showed nonob cad;   08-27-2018 stress  test showed intermediate risk w/ apex ischemia, nuclear ef 61%;   cath done 08-29-2018 showed Nonobstructive 25% plaque in LAD    Chronic neck pain    narcotic dependant   DDD (degenerative disc disease), cervical    Depression    Diabetic neuropathy ([place])    Diverticulosis 2013   by CT scan   Erectile dysfunction 08/01/2015   Essential hypertension 01/01/2008   Fatty liver 07/31/2018   By [place] 07/2018,  followed by pcp   GERD (gastroesophageal reflux disease)    Hemorrhoids    History of anemia    History of cocaine abuse ([place])    per pt quit 05/ 2004   History of cocaine use 2004   History of colon polyps    History of epididymitis    History of esophageal stricture    s/p  dilatation 2009;  12-30-2018   History of gastric ulcer    12-30-2018  EGD showed non-bleeding gastric ulcer   History of prostatitis 08/2011   History of transient ischemic attack (TIA)    remote hx , related to cocaine abuse which pt quit 2005   Hyperlipidemia    Hypertension    Essential   OSA (obstructive sleep apnea)    study 05-07-2008 in epic , mild ,  pt does not use cpap   Pain due to onychomycosis of toenails of both feet 10/15/2018   Prostate cancer [facility]) urologist--- dr herrick/  oncologist--- dr Tammi Klippel   first dx 01/ 2018 w/ Stage T2a and Gleason 3+3 , active survillance;  repear bx 04-21-2019 Gleason 3+4,  scheduled for brachytherapy 07-03-2019   T2DM (type 2 diabetes mellitus) ([place])    followed by pcp---  (03-04-201  checks blood sugar 3 times daily,  fasting sugar's-- 140-150   Past Surgical History:  Procedure Laterality Date   ANTERIOR CERVICAL DECOMP/DISCECTOMY FUSION  06-24-2008  and 10-28-2008 @[facility]    06-25-2008  C7 -- T1;    10-28-2008  C5 -- C7   CARDIAC CATHETERIZATION  08-24-2002   dr gamble  @[facility]    normal coronaries   CARDIAC CATHETERIZATION  12-31-2007    dr Johnsie Cancel   nonobstructive CAD   CARPAL TUNNEL RELEASE Right 2000  approx.   CATARACT EXTRACTION W/ INTRAOCULAR LENS  IMPLANT,  BILATERAL  2016   COLONOSCOPY  10/11/2011   severe diverticulosis, int hemorrhoids, rec rpt 5 yrs Carlean Purl)   COLONOSCOPY  12/2018   benign rectal polyp Carlean Purl)   DIRECT LARYNGOSCOPY  11/02/2008   @[facility]    Extubation under anesthesia post op complication from ACDF   ESOPHAGOGASTRODUODENOSCOPY  10/11/2011   WNL Carlean Purl)   ESOPHAGOGASTRODUODENOSCOPY  09/11-2018   gastric ulcer, gastritis, dilated esophageal stenosis, limit NSAIDs continue PPI, no f/u planned Carlean Purl)   EXCISION MASS NECK N/A 08/29/2016   Procedure: EXCISION MASS POSTERIOR NECK;  Surgeon: Erroll Luna,  MD;  Location: [place];  Service: General;  Laterality: N/A;   HEMATOMA EVACUATION  10/29/2008   @[facility]    post op ACDF 10-28-2008   LAPAROSCOPIC INGUINAL HERNIA REPAIR Bilateral 11-22-2006   @[facility]    AND EXCISION RLQ ABDOMINAL LIPOMA   LEFT HEART CATH AND CORONARY ANGIOGRAPHY N/A 08/29/2018   Procedure: LEFT HEART CATH AND CORONARY ANGIOGRAPHY;  Surgeon: Jettie Booze, MD;  Location: [place] [facility];  Service: Cardiovascular;  Laterality: N/A;   RADIOACTIVE SEED IMPLANT N/A 07/03/2019   Procedure: RADIOACTIVE SEED IMPLANT/BRACHYTHERAPY IMPLANT;  Surgeon: Ardis Hughs, MD;  [facility];  Service: Urology;  Laterality: N/A;   SHOULDER ARTHROSCOPY WITH SUBACROMIAL DECOMPRESSION AND OPEN ROTATOR C Left 01/23/2013   Procedure: LEFT SHOULDER ARTHROSCOPY WITH SUBACROMIAL DECOMPRESSION AND MINI OPEN ROTATOR CUFF REPAIR, AND OPEN DISTAL CLAVICLE RESECTION;  Surgeon: Augustin Schooling, MD;  Location: [place];  Service: Orthopedics;  Laterality: Left;   SHOULDER SURGERY Right 05/2011   SPACE OAR INSTILLATION N/A 07/03/2019   Procedure: SPACE OAR INSTILLATION;  Surgeon: Ardis Hughs, MD;  Location: [facility];  Service: Urology;  Laterality: N/A;   TONSILLECTOMY  child   TRIGGER FINGER RELEASE Right 12-12-2005   @[facility]    Right long finger   Family History  Problem  Relation Age of Onset   Diabetes Mother    Hypertension Mother    Hyperlipidemia Mother    Diabetes Father    Diabetes Sister    Hypertension Sister    Hypertension Brother    Diabetes Brother    Diabetes Brother    Hypertension Brother    Diabetes Brother    Heart disease Maternal Uncle        Heart failure   Cancer Paternal Uncle        Prostate   Cancer Paternal Grandfather        Prostate   Stroke Neg Hx    Esophageal cancer Neg Hx    Rectal cancer Neg Hx    Stomach cancer Neg Hx    Breast cancer Neg Hx    Colon cancer Neg Hx    Social History   Socioeconomic History   Marital status: Single    Spouse name: Not on file   Number of children: 1   Years of education: Not on file   Highest education level: Not on file  Occupational History   Occupation: [name] previously    Fish farm manager: UNEMPLOYED    Comment: Now on disability after neck surgery  Tobacco Use   Smoking status: Never   Smokeless tobacco: Never  Vaping Use   Vaping Use: Never used  Substance and Sexual Activity   Alcohol use: No    Alcohol/week: 0.0 standard drinks   Drug use: Not Currently    Comment: H/O marijuana (last 2005); Cocaine 5/04   Sexual activity: Not on file  Other Topics Concern   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   Activity: no regular exercise, wants to start walking   Diet: good water, fruits/vegetables daily.      Retired   1 daughter and 1 granddaughter   Social Determinants of Radio broadcast assistant Strain: Low Risk    Difficulty of Paying Living Expenses: Not very hard  Food Insecurity: Not  on file  Transportation Needs: Not on file  Physical Activity: Not on file  Stress: Not on file  Social Connections: Not on file    Tobacco Counseling Counseling given: Not  Answered   Clinical Intake:                 Diabetic: Yes Nutrition Risk Assessment:  Has the patient had any N/V/D within the last 2 months?  {YES/NO:21197} Does the patient have any non-healing wounds?  {YES/NO:21197} Has the patient had any unintentional weight loss or weight gain?  {YES/NO:21197}  Diabetes:  Is the patient diabetic?  Yes  If diabetic, was a CBG obtained today?  No  telephone visit  Did the patient bring in their glucometer from home?  No  telephone visit  How often do you monitor your CBG's? ***.   Financial Strains and Diabetes Management:  Are you having any financial strains with the device, your supplies or your medication? No .  Does the patient want to be seen by Chronic Care Management for management of their diabetes?  No  Would the patient like to be referred to a Nutritionist or for Diabetic Management?  No   Diabetic Exams:  {Diabetic Eye Exam:2101801} Diabetic Foot Exam: Completed 04/29/2020          Activities of Daily Living No flowsheet data found.  Patient Care Team: Ria Bush, MD as PCP - General (Family Medicine) Josue Hector, MD as PCP - Cardiology (Cardiology) Tonia Ghent, MD (Family Medicine) Shirley Muscat Loreen Freud, MD as Referring Physician (Optometry) Netta Cedars, MD as Consulting Physician (Orthopedic Surgery) Melina Schools, MD as Consulting Physician (Orthopedic Surgery) Ardis Hughs, MD as Consulting Physician (Urology) Tyler Pita, MD as Consulting Physician (Radiation Oncology) Cira Rue, RN Nurse Navigator as Registered Nurse ([place]) [name], [facility] as Pharmacist (Pharmacist)  Indicate any recent [place] you may have received from other than [facility] providers in the past year (date may be approximate).     Assessment:   This is a routine wellness examination for [place].  Hearing/Vision screen No results found.  Dietary issues and exercise activities  discussed:     Goals Addressed   None    Depression Screen PHQ 2/9 Scores 10/29/2019 10/16/2018 08/13/2018 03/04/2017 02/02/2016 01/31/2015 01/26/2014  PHQ - 2 Score 0 0 0 0 0 0 0  PHQ- 9 Score 0 0 - 0 - - -    Fall Risk Fall Risk  10/29/2019 10/16/2018 03/04/2017 02/02/2016 01/31/2015  Falls in the past year? 0 0 No No No  Number falls in past yr: 0 - - - -  Injury with Fall? 0 - - - -  Risk for fall due to : Impaired balance/gait;Medication side effect - - - -  Follow up Falls evaluation completed;Falls prevention discussed - - - -    FALL RISK PREVENTION PERTAINING TO THE HOME:  Any stairs in or around the home? Yes  If so, are there any without handrails? No  Home free of loose throw rugs in walkways, pet beds, electrical cords, etc? Yes  Adequate lighting in your home to reduce risk of falls? Yes   ASSISTIVE DEVICES UTILIZED TO PREVENT FALLS:  Life alert? {YES/NO:21197} Use of a cane, walker or w/c? {YES/NO:21197} Grab bars in the bathroom? {YES/NO:21197} Shower chair or bench in shower? {YES/NO:21197} Elevated toilet seat or a handicapped toilet? {YES/NO:21197}  TIMED UP AND GO:  Was the test performed?  N/A telephone visit .  Cognitive Function: MMSE - Mini Mental State Exam 10/29/2019 10/16/2018 03/04/2017 02/02/2016  Not completed: Refused - - -  Orientation to time - 5 5 5   Orientation to Place - 5 5 5   Registration - 3 3 3   Attention/ Calculation - 0 0 0  Recall - 3 3 3   Language- name 2 objects - 0 0 0  Language- repeat - 1 1 1   Language- follow 3 step command - 0 3 3  Language- read & follow direction - 0 0 0  Write a sentence - 0 0 0  Copy design - 0 0 0  Total score - 17 20 20   Mini Cog  Mini-Cog screen was completed. Maximum score is 22. A value of 0 denotes this part of the MMSE was not completed or the patient failed this part of the Mini-Cog screening.       Immunizations Immunization History  Administered Date(s) Administered   Fluad  Quad(high Dose 65+) 02/03/2019, 03/09/2020   Influenza Split 02/06/2011, 01/08/2012   Influenza Whole 03/19/2007, 02/19/2008, 01/27/2009, 01/25/2010   Influenza, High Dose Seasonal PF 03/05/2018   Influenza,inj,Quad PF,6+ Mos 02/20/2013, 01/26/2014, 01/31/2015, 02/02/2016, 03/04/2017   Influenza,inj,quad, With Preservative 01/22/2019   PFIZER(Purple Top)SARS-COV-2 Vaccination 08/22/2019, 09/12/2019   Pneumococcal Conjugate-13 08/01/2015   Pneumococcal Polysaccharide-23 06/16/2012, 04/30/2019   Td 04/24/2003   Tdap 01/26/2014   Zoster, Live 12/15/2010    TDAP status: Up to date  Flu Vaccine status: Up to date  Pneumococcal vaccine status: Up to date  {Covid-19 vaccine status:2101808}  Qualifies for Shingles Vaccine? Yes   Zostavax completed Yes   Shingrix Completed?: No.    Education has been provided regarding the importance of this vaccine. Patient has been advised to call insurance company to determine out of pocket expense if they have not yet received this vaccine. Advised may also receive vaccine at local pharmacy or Health Dept. Verbalized acceptance and understanding.  Screening Tests Health Maintenance  Topic Date Due   Zoster Vaccines- Shingrix (1 of 2) Never done   COVID-19 Vaccine (3 - Pfizer risk series) 10/10/2019   OPHTHALMOLOGY EXAM  04/30/2020   INFLUENZA VACCINE  11/21/2020   HEMOGLOBIN A1C  04/28/2021   FOOT EXAM  04/29/2021   TETANUS/TDAP  01/27/2024   COLONOSCOPY (Pts 45-49yrs Insurance coverage will need to be confirmed)  12/29/2028   Hepatitis C Screening  Completed   PNA vac Low Risk Adult  Completed   HPV VACCINES  Aged Out    Health Maintenance  Health Maintenance Due  Topic Date Due   Zoster Vaccines- Shingrix (1 of 2) Never done   COVID-19 Vaccine (3 - Pfizer risk series) 10/10/2019   OPHTHALMOLOGY EXAM  04/30/2020    Colorectal cancer screening: Type of screening: Colonoscopy. Completed 12/30/2018. Repeat every 10 years  Lung Cancer  Screening: (Low Dose CT Chest recommended if Age 59-80 years, 30 pack-year currently smoking OR have quit w/in 15years.) does not qualify.    Additional Screening:  Hepatitis C Screening: does qualify; Completed 08/01/2015  Vision Screening: Recommended annual ophthalmology exams for early detection of glaucoma and other disorders of the eye. Is the patient up to date with their annual eye exam?  {YES/NO:21197} Who is the provider or what is the name of the office in which the patient attends annual eye exams? *** If pt is not established with a provider, would they like to be referred to a provider to establish care? No .   Dental Screening: Recommended annual dental  exams for proper oral hygiene  Community Resource Referral / Chronic Care Management: CRR required this visit?  No   CCM required this visit?  No      Plan:     I have personally reviewed and noted the following in the patient's chart:   Medical and social history Use of alcohol, tobacco or illicit drugs  Current medications and supplements including opioid prescriptions. {Opioid Prescriptions:[phone]} Functional ability and status Nutritional status Physical activity Advanced directives List of other physicians Hospitalizations, surgeries, and ER visits in previous 12 months Vitals Screenings to include cognitive, depression, and falls Referrals and appointments  In addition, I have reviewed and discussed with patient certain preventive protocols, quality metrics, and best practice recommendations. A written personalized care plan for preventive services as well as general preventive health recommendations were provided to patient.     Andrez Grime, LPN   01/14/4143   Nurse Notes: ***

## 2020-11-01 NOTE — Telephone Encounter (Signed)
Called patient to complete his AWV. Patient stated that he was currently at work and could not do this right now. Appointment cancelled per patient request.

## 2020-11-02 ENCOUNTER — Ambulatory Visit (INDEPENDENT_AMBULATORY_CARE_PROVIDER_SITE_OTHER): Payer: Medicare Other | Admitting: Family Medicine

## 2020-11-02 ENCOUNTER — Other Ambulatory Visit: Payer: Self-pay

## 2020-11-02 ENCOUNTER — Encounter: Payer: Self-pay | Admitting: Family Medicine

## 2020-11-02 ENCOUNTER — Telehealth: Payer: Self-pay | Admitting: Family Medicine

## 2020-11-02 VITALS — BP 124/76 | HR 62 | Temp 97.9°F | Ht 67.75 in | Wt 232.0 lb

## 2020-11-02 DIAGNOSIS — IMO0002 Reserved for concepts with insufficient information to code with codable children: Secondary | ICD-10-CM

## 2020-11-02 DIAGNOSIS — K219 Gastro-esophageal reflux disease without esophagitis: Secondary | ICD-10-CM

## 2020-11-02 DIAGNOSIS — D509 Iron deficiency anemia, unspecified: Secondary | ICD-10-CM

## 2020-11-02 DIAGNOSIS — E1169 Type 2 diabetes mellitus with other specified complication: Secondary | ICD-10-CM

## 2020-11-02 DIAGNOSIS — Z0000 Encounter for general adult medical examination without abnormal findings: Secondary | ICD-10-CM

## 2020-11-02 DIAGNOSIS — I77819 Aortic ectasia, unspecified site: Secondary | ICD-10-CM

## 2020-11-02 DIAGNOSIS — I77810 Thoracic aortic ectasia: Secondary | ICD-10-CM

## 2020-11-02 DIAGNOSIS — I10 Essential (primary) hypertension: Secondary | ICD-10-CM

## 2020-11-02 DIAGNOSIS — Z7189 Other specified counseling: Secondary | ICD-10-CM

## 2020-11-02 DIAGNOSIS — E1165 Type 2 diabetes mellitus with hyperglycemia: Secondary | ICD-10-CM

## 2020-11-02 DIAGNOSIS — I2510 Atherosclerotic heart disease of native coronary artery without angina pectoris: Secondary | ICD-10-CM

## 2020-11-02 DIAGNOSIS — C61 Malignant neoplasm of prostate: Secondary | ICD-10-CM

## 2020-11-02 NOTE — Assessment & Plan Note (Signed)
Chronic, stable. Continue current regimen. 

## 2020-11-02 NOTE — Assessment & Plan Note (Addendum)
Chronic, stable on high dose atorvastatin. The ASCVD Risk score Robert Bussing DC Jr., et al., 2013) failed to calculate for the following reasons:   The valid total cholesterol range is 130 to 320 mg/dL

## 2020-11-02 NOTE — Assessment & Plan Note (Signed)
Chronic, above goal <7%. Continue current regimen - he states jardiance may be unaffordable. Will ask chronic management team to assist with application.

## 2020-11-02 NOTE — Assessment & Plan Note (Signed)
Recent normal CTA.

## 2020-11-02 NOTE — Telephone Encounter (Signed)
Great thank you!

## 2020-11-02 NOTE — Patient Instructions (Addendum)
If interested, check with pharmacy about new 2 shot shingles series (shingrix).  We will request records from Dr Gershon Crane for latest diabetic eye exam. Advanced directive provided today.  Bring back medicare wellness form (first 3 pages).  You are doing well today Return in 3 months for follow up visit.  We will look into patient assistance for jardiance  Health Maintenance After Age 71 After age 74, you are at a higher risk for certain long-term diseases and infections as well as injuries from falls. Falls are a major cause of broken bones and head injuries in people who are older than age 31. Getting regular preventive care can help to keep you healthy and well. Preventive care includes getting regular testing and making lifestyle changes as recommended by your health care provider. Talk with your health care provider about: Which screenings and tests you should have. A screening is a test that checks for a disease when you have no symptoms. A diet and exercise plan that is right for you. What should I know about screenings and tests to prevent falls? Screening and testing are the best ways to find a health problem early. Early diagnosis and treatment give you the best chance of managing medical conditions that are common after age 70. Certain conditions and lifestyle choices may make you more likely to have a fall. Your health care provider may recommend: Regular vision checks. Poor vision and conditions such as cataracts can make you more likely to have a fall. If you wear glasses, make sure to get your prescription updated if your vision changes. Medicine review. Work with your health care provider to regularly review all of the medicines you are taking, including over-the-counter medicines. Ask your health care provider about any side effects that may make you more likely to have a fall. Tell your health care provider if any medicines that you take make you feel dizzy or sleepy. Osteoporosis  screening. Osteoporosis is a condition that causes the bones to get weaker. This can make the bones weak and cause them to break more easily. Blood pressure screening. Blood pressure changes and medicines to control blood pressure can make you feel dizzy. Strength and balance checks. Your health care provider may recommend certain tests to check your strength and balance while standing, walking, or changing positions. Foot health exam. Foot pain and numbness, as well as not wearing proper footwear, can make you more likely to have a fall. Depression screening. You may be more likely to have a fall if you have a fear of falling, feel emotionally low, or feel unable to do activities that you used to do. Alcohol use screening. Using too much alcohol can affect your balance and may make you more likely to have a fall. What actions can I take to lower my risk of falls? General instructions Talk with your health care provider about your risks for falling. Tell your health care provider if: You fall. Be sure to tell your health care provider about all falls, even ones that seem minor. You feel dizzy, sleepy, or off-balance. Take over-the-counter and prescription medicines only as told by your health care provider. These include any supplements. Eat a healthy diet and maintain a healthy weight. A healthy diet includes low-fat dairy products, low-fat (lean) meats, and fiber from whole grains, beans, and lots of fruits and vegetables. Home safety Remove any tripping hazards, such as rugs, cords, and clutter. Install safety equipment such as grab bars in bathrooms and safety rails on  stairs. Keep rooms and walkways well-lit. Activity  Follow a regular exercise program to stay fit. This will help you maintain your balance. Ask your health care provider what types of exercise are appropriate for you. If you need a cane or walker, use it as recommended by your health care provider. Wear supportive shoes that  have nonskid soles.  Lifestyle Do not drink alcohol if your health care provider tells you not to drink. If you drink alcohol, limit how much you have: 0-1 drink a day for women. 0-2 drinks a day for men. Be aware of how much alcohol is in your drink. In the [place] one drink equals one typical bottle of beer (12 oz), one-half glass of wine (5 oz), or one shot of hard liquor (1 oz). Do not use any products that contain nicotine or tobacco, such as cigarettes and e-cigarettes. If you need help quitting, ask your health care provider. Summary Having a healthy lifestyle and getting preventive care can help to protect your health and wellness after age 40. Screening and testing are the best way to find a health problem early and help you avoid having a fall. Early diagnosis and treatment give you the best chance for managing medical conditions that are more common for people who are older than age 27. Falls are a major cause of broken bones and head injuries in people who are older than age 45. Take precautions to prevent a fall at home. Work with your health care provider to learn what changes you can make to improve your health and wellness and to prevent falls. This information is not intended to replace advice given to you by your health care provider. Make sure you discuss any questions you have with your healthcare provider. Document Revised: 03/25/2020 Document Reviewed: 03/25/2020 Elsevier Patient Education  2022 [employer].

## 2020-11-02 NOTE — Assessment & Plan Note (Signed)
Iron stores dropping - continue to monitor.

## 2020-11-02 NOTE — Assessment & Plan Note (Signed)
Congratulated on weight loss noted, encouraged ongoing efforts.

## 2020-11-02 NOTE — Assessment & Plan Note (Signed)
Continue daily ppi

## 2020-11-02 NOTE — Assessment & Plan Note (Signed)

## 2020-11-02 NOTE — Assessment & Plan Note (Addendum)
Advanced directive - daughter Everardo Pacific is HCPOA. Wants to be cremated. Would not want prolonged life support if terminal condition. [place] with compressions/CPR. Does not have advanced directive. Handout provided today. Has discussed his desires with daughter.

## 2020-11-02 NOTE — Progress Notes (Signed)
Contacted [facility] regarding patient's Jardiance. Patient is approved 05/20/20-04/22/21. They do not offer automatic refill so patient has not received this from them. Scheduled refill. Representative states it will be shipped tomorrow and should arrive in 5-7 business days. Patient made aware. Patient also notes that he was supposed to return some paper work to Dr. Danise Mina today but is running low on time. He states he will not be able to return the paper work until tomorrow after 10 am. Clinic made aware.    [name], CPP notified  Margaretmary Dys, [facility] Assistant [phone]

## 2020-11-02 NOTE — Assessment & Plan Note (Signed)
Preventative protocols reviewed and updated unless pt declined. Discussed healthy diet and lifestyle.  

## 2020-11-02 NOTE — Assessment & Plan Note (Signed)
Nonobstructive by cath 2020.  Planning to see cardiology yearly.

## 2020-11-02 NOTE — Assessment & Plan Note (Signed)
S/p brachytherapy seed implant. Followed by urology - appreciate their care.
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
## 2020-11-02 NOTE — Telephone Encounter (Signed)
At [place] today pt mentions jardiance price going up to $300.  Can we check to see if he qualifies for any ongoing patient assistance? Thank you.

## 2020-11-21 ENCOUNTER — Emergency Department (HOSPITAL_COMMUNITY): Payer: Medicare Other

## 2020-11-21 ENCOUNTER — Encounter (HOSPITAL_COMMUNITY): Payer: Self-pay

## 2020-11-21 ENCOUNTER — Emergency Department (HOSPITAL_COMMUNITY)
Admission: EM | Admit: 2020-11-21 | Discharge: 2020-11-21 | Disposition: A | Discharge status: Home / Self Care | Payer: Medicare Other | Attending: Emergency Medicine | Admitting: Emergency Medicine

## 2020-11-21 ENCOUNTER — Telehealth: Payer: Self-pay

## 2020-11-21 ENCOUNTER — Other Ambulatory Visit: Payer: Self-pay

## 2020-11-21 DIAGNOSIS — Z20822 Contact with and (suspected) exposure to covid-19: Secondary | ICD-10-CM | POA: Insufficient documentation

## 2020-11-21 DIAGNOSIS — I2510 Atherosclerotic heart disease of native coronary artery without angina pectoris: Secondary | ICD-10-CM | POA: Diagnosis not present

## 2020-11-21 DIAGNOSIS — R519 Headache, unspecified: Secondary | ICD-10-CM | POA: Insufficient documentation

## 2020-11-21 DIAGNOSIS — Z7984 Long term (current) use of oral hypoglycemic drugs: Secondary | ICD-10-CM | POA: Diagnosis not present

## 2020-11-21 DIAGNOSIS — R059 Cough, unspecified: Secondary | ICD-10-CM | POA: Diagnosis not present

## 2020-11-21 DIAGNOSIS — R42 Dizziness and giddiness: Secondary | ICD-10-CM | POA: Insufficient documentation

## 2020-11-21 DIAGNOSIS — R079 Chest pain, unspecified: Secondary | ICD-10-CM | POA: Diagnosis not present

## 2020-11-21 DIAGNOSIS — Z7982 Long term (current) use of aspirin: Secondary | ICD-10-CM | POA: Diagnosis not present

## 2020-11-21 DIAGNOSIS — Z79899 Other long term (current) drug therapy: Secondary | ICD-10-CM | POA: Diagnosis not present

## 2020-11-21 DIAGNOSIS — R110 Nausea: Secondary | ICD-10-CM | POA: Diagnosis not present

## 2020-11-21 DIAGNOSIS — Z955 Presence of coronary angioplasty implant and graft: Secondary | ICD-10-CM | POA: Insufficient documentation

## 2020-11-21 DIAGNOSIS — Z8546 Personal history of malignant neoplasm of prostate: Secondary | ICD-10-CM | POA: Diagnosis not present

## 2020-11-21 DIAGNOSIS — R0789 Other chest pain: Secondary | ICD-10-CM | POA: Diagnosis not present

## 2020-11-21 DIAGNOSIS — E1140 Type 2 diabetes mellitus with diabetic neuropathy, unspecified: Secondary | ICD-10-CM | POA: Diagnosis not present

## 2020-11-21 DIAGNOSIS — I10 Essential (primary) hypertension: Secondary | ICD-10-CM | POA: Insufficient documentation

## 2020-11-21 LAB — CBC WITH DIFFERENTIAL/PLATELET
Abs Immature Granulocytes: 0.02 10*3/uL (ref 0.00–0.07)
Basophils Absolute: 0 10*3/uL (ref 0.0–0.1)
Basophils Relative: 0 %
Eosinophils Absolute: 0.1 10*3/uL (ref 0.0–0.5)
Eosinophils Relative: 1 %
HCT: 41.9 % (ref 39.0–52.0)
Hemoglobin: 14 g/dL (ref 13.0–17.0)
Immature Granulocytes: 0 %
Lymphocytes Relative: 19 %
Lymphs Abs: 1.2 10*3/uL (ref 0.7–4.0)
MCH: 30.2 pg (ref 26.0–34.0)
MCHC: 33.4 g/dL (ref 30.0–36.0)
MCV: 90.5 fL (ref 80.0–100.0)
Monocytes Absolute: 0.7 10*3/uL (ref 0.1–1.0)
Monocytes Relative: 11 %
Neutro Abs: 4.2 10*3/uL (ref 1.7–7.7)
Neutrophils Relative %: 69 %
Platelets: 276 10*3/uL (ref 150–400)
RBC: 4.63 MIL/uL (ref 4.22–5.81)
RDW: 15.3 % (ref 11.5–15.5)
WBC: 6.3 10*3/uL (ref 4.0–10.5)
nRBC: 0 % (ref 0.0–0.2)

## 2020-11-21 LAB — BASIC METABOLIC PANEL
Anion gap: 9 (ref 5–15)
BUN: 11 mg/dL (ref 8–23)
CO2: 26 mmol/L (ref 22–32)
Calcium: 9.2 mg/dL (ref 8.9–10.3)
Chloride: 105 mmol/L (ref 98–111)
Creatinine, Ser: 0.9 mg/dL (ref 0.61–1.24)
GFR, Estimated: >60 mL/min (ref >60)
Glucose, Bld: 143 mg/dL — ABNORMAL HIGH (ref 70–99)
Potassium: 3.8 mmol/L (ref 3.5–5.1)
Sodium: 140 mmol/L (ref 135–145)

## 2020-11-21 LAB — RESP PANEL BY RT-PCR (FLU A&B, COVID) ARPGX2
Influenza A by PCR: NEGATIVE
Influenza B by PCR: NEGATIVE
SARS Coronavirus 2 by RT PCR: NEGATIVE

## 2020-11-21 LAB — LIPASE, BLOOD: Lipase: 34 U/L (ref 11–51)

## 2020-11-21 LAB — TROPONIN I (HIGH SENSITIVITY): Troponin I (High Sensitivity): 5 ng/L (ref <18)

## 2020-11-21 MED ORDER — SODIUM CHLORIDE 0.9 % IV BOLUS: 500.0000 mL | Freq: Once | INTRAVENOUS | Status: DC

## 2020-11-21 MED ORDER — OXYCODONE-ACETAMINOPHEN 5-325 MG PO TABS
2.0000 | ORAL_TABLET | Freq: Once | ORAL | Status: AC
  Administered 2020-11-21: 2 via ORAL
  Filled 2020-11-21: qty 2

## 2020-11-21 NOTE — Telephone Encounter (Signed)
Pt said he is now nauseated, prod cough with clear phlegm, H/A now is 8 1/2 now;and dizzy pt cannot describe the dizziness. Pt said he feels warm now and not sure if fever or not. Pt said he is going to [facility] ED now to get checked out. Pt declined 911 and has someone that will drive him. Sending note to Dr Darnell Level and Lattie Haw CMA. Also sending note in teams to Dr Darnell Level.

## 2020-11-21 NOTE — Telephone Encounter (Signed)
[facility] - Client TELEPHONE ADVICE RECORD [facility] Patient Name: [name] [facility] Gender: Male DOB: 02-Jun-1950 Age: 70 Y 9 M 7 D Return Phone Number: [phone] (Primary) Address: City/ State/ Zip: [place] [address] Client [place] [facility] - Client Client Site [place] Physician Ria Bush - MD Contact Type Call Who Is Calling Patient / Member / Family / Caregiver Call Type Triage / Clinical Relationship To Patient Self Return Phone Number [phone] (Primary) Chief Complaint Dizziness Reason for Call Symptomatic / Request for [place] states they have dizziness and the office wants them triaged. Translation No Nurse Assessment Nurse: Doyle Askew, RN, Beth Date/Time (Eastern Time): 11/21/2020 1:31:42 PM Confirm and document reason for call. If symptomatic, describe symptoms. ---Caller states they have dizziness that started 2 days ago and the office wants them triaged. Does the patient have any new or worsening symptoms? ---Yes Will a triage be completed? ---Yes Related visit to physician within the last 2 weeks? ---No Does the PT have any chronic conditions? (i.e. diabetes, asthma, this includes High risk factors for pregnancy, etc.) ---Yes List chronic conditions. ---diabetes, HTN Is this a behavioral health or substance abuse call? ---No Guidelines Guideline Title Affirmed Question Affirmed Notes Nurse Date/Time (Eastern Time) Dizziness - Lightheadedness [1] MODERATE dizziness (e.g., interferes with normal activities) AND [2] has NOT been evaluated by physician for this (Exception: dizziness caused by heat exposure, sudden standing, or poor fluid intake) Doyle Askew, RN, [facility] 11/21/2020 1:34:54 PM PLEASE NOTE: All timestamps contained within this report are represented as [place] Standard Time. CONFIDENTIALTY NOTICE: This fax  transmission is intended only for the addressee. It contains information that is legally privileged, confidential or otherwise protected from use or disclosure. If you are not the intended recipient, you are strictly prohibited from reviewing, disclosing, copying using or disseminating any of this information or taking any action in reliance on or regarding this information. If you have received this fax in error, please notify us immediately by telephone so that we can arrange for its return to [place]. Phone: [phone], Toll-Free: [phone], Fax: [phone] Page: 2 of 2 Call Id: [id number] [name]. Time [name] Time) Disposition Final User 11/21/2020 1:41:53 PM See PCP within 24 Hours Yes Doyle Askew, RN, [name] Caller Disagree/Comply Comply Caller Understands Yes PreDisposition Go to ED Care Advice Given Per Guideline SEE PCP WITHIN 24 HOURS: * IF OFFICE WILL BE OPEN: You need to be examined within the next 24 hours. Call your doctor (or NP/PA) when the office opens and make an appointment. DRINK FLUIDS: * Drink several glasses of fruit juice, other clear fluids or water. * This will improve hydration and blood glucose. * If the weather is hot or you have a fever, make sure the fluids are cold. LIE DOWN AND REST: * Lie down with feet elevated for 1 hour. * This will improve circulation and increase blood flow to the brain. * You become worse * Passes out (faints) CALL BACK IF: CARE ADVICE given per Dizziness (Adult) guideline. Comments User: Melene Muller, RN Date/Time [name] Time): 11/21/2020 1:33:43 PM Current BS 124 Current BP 147/79 per home cuff Referrals REFERRED TO PCP OFFICE

## 2020-11-21 NOTE — ED Provider Notes (Signed)
Emergency Medicine Provider Triage Evaluation Note  [name] , a 70 y.o. male  was evaluated in triage.  Pt complains of cough that has been going on for the last few weeks. He further reports some dizziness ongoing for several weeks as well denies any current chest pain.  Review of Systems  Positive: Cough, dizziness Negative: Chest pain  Physical Exam  BP (!) 165/92 (BP Location: Right Arm)   Pulse 79   Temp 98.5 F (36.9 C) (Oral)   Resp 16   Ht 5' 7.75" (1.721 m)   Wt 104.8 kg   SpO2 100%   BMI 35.38 kg/m  Gen:   Awake, no distress   Resp:  Normal effort  MSK:   Moves extremities without difficulty  Other:  Lungs ctab  Medical Decision Making  Medically screening exam initiated at 5:25 PM.  Appropriate orders placed.  [name] was informed that the remainder of the evaluation will be completed by another provider, this initial triage assessment does not replace that evaluation, and the importance of remaining in the ED until their evaluation is complete.     Rodney Booze, PA-C 11/21/20 1727    Lucrezia Starch, MD 11/22/20 [phone]

## 2020-11-21 NOTE — ED Triage Notes (Addendum)
Patient c/o nausea, a productive cough  with clear chunky sputum, dizziness x 3 weeks.

## 2020-11-21 NOTE — ED Provider Notes (Signed)
[facility] DEPT Provider Note   CSN: [id number] Arrival date & time: 11/21/20  1637     History Chief Complaint  Patient presents with   Cough    [name] is a 70 y.o. male.  The history is provided by the patient.  Cough Cough characteristics:  Productive Sputum characteristics:  Nondescript Severity:  Mild Onset quality:  Gradual Duration:  4 days Timing:  Intermittent Progression:  Waxing and waning Chronicity:  New Context: upper respiratory infection   Relieved by:  Nothing Worsened by:  Nothing Associated symptoms: chest pain and headaches   Associated symptoms: no chills, no ear pain, no fever, no rash, no shortness of breath and no sore throat   Headache Associated symptoms: cough and dizziness   Associated symptoms: no abdominal pain, no back pain, no ear pain, no eye pain, no fever, no seizures, no sore throat and no vomiting   Dizziness Associated symptoms: chest pain and headaches   Associated symptoms: no palpitations, no shortness of breath and no vomiting       Past Medical History:  Diagnosis Date   Acquired dilation of ascending aorta and aortic root (HCC)    25m ascending aorta   Allergic rhinitis    Anxiety    Atherosclerosis of aorta (HCC)    CAD (coronary artery disease) cardiologist-- dr tTressia Minersturner   long hx atypical chest pain--- cardiac cath 08-24-2002 in epic showed normal coronaries;   03-17-2007  nuclear stress test showed ?small infarct , ef 60%;  cath done 12-31-2007 for recurrent CP showed nonob cad;   08-27-2018 stress test showed intermediate risk w/ apex ischemia, nuclear ef 61%;   cath done 08-29-2018 showed Nonobstructive 25% plaque in LAD    Chronic neck pain    narcotic dependant   DDD (degenerative disc disease), cervical    Depression    Diabetic neuropathy ([place]    Diverticulosis 2013   by CT scan   Erectile dysfunction 08/01/2015   Essential hypertension 01/01/2008   Fatty liver  07/31/2018   By [place],  followed by pcp   GERD (gastroesophageal reflux disease)    Hemorrhoids    History of anemia    History of cocaine abuse ([place]    per pt quit 05/ 2004   History of cocaine use 2004   History of colon polyps    History of epididymitis    History of esophageal stricture    s/p  dilatation 2009;  12-30-2018   History of gastric ulcer    12-30-2018  EGD showed non-bleeding gastric ulcer   History of prostatitis 08/2011   History of transient ischemic attack (TIA)    remote hx , related to cocaine abuse which pt quit 2005   Hyperlipidemia    Hypertension    Essential   OSA (obstructive sleep apnea)    study 05-07-2008 in epic , mild ,  pt does not use cpap   Pain due to onychomycosis of toenails of both feet 10/15/2018   Prostate cancer ([facility] urologist--- dr herrick/  oncologist--- dr mTammi Klippel  first dx 01/ 2018 w/ Stage T2a and Gleason 3+3 , active survillance;  repear bx 04-21-2019 Gleason 3+4,  scheduled for brachytherapy 07-03-2019   T2DM (type 2 diabetes mellitus) ([place]    followed by pcp---  (03-04-201  checks blood sugar 3 times daily,  fasting sugar's-- 140-150    Patient Active Problem List   Diagnosis Date Noted   Osteoarthritis 07/27/2020  Acquired dilation of ascending aorta and aortic root (HCC)    NSAID-induced gastric ulcer 01/07/2019   Pain due to onychomycosis of toenails of both feet 10/15/2018   CAD (coronary artery disease) 09/03/2018   Abnormal stress test    Tongue lesion 08/05/2018   Fatty liver 07/31/2018   Lightheadedness 08/06/2017   Erectile dysfunction 08/01/2015   Health maintenance examination 01/31/2015   Gross hematuria    Lipoma of neck 10/01/2014   Allergic rhinitis 10/01/2014   Diabetic neuropathy ([place]) 05/31/2014   Advanced care planning/counseling discussion 01/26/2014   OSA (obstructive sleep apnea)    Tinea cruris 10/08/2012   Malignant neoplasm of prostate ([place]) 06/16/2012   Severe obesity (BMI 35.0-39.9)  with comorbidity ([place]) 01/08/2012   Esophageal dysphagia 10/10/2011   Diverticulosis 10/09/2011   Male sexual dysfunction 09/11/2011   Insomnia 06/19/2011   Shoulder pain 04/08/2011   Neck pain, chronic 02/24/2011   Medicare annual wellness visit, subsequent 10/26/2010   Iron deficiency anemia 07/01/2009   GERD 09/29/2008   Essential hypertension 01/01/2008   ANXIETY DEPRESSION 03/19/2007   TIA (transient ischemic attack) 03/05/2007   Back pain with radiculopathy 12/31/2006   ABDOMINAL WALL HERNIA 10/03/2006   Diabetes mellitus type 2, uncontrolled, with complications ([place]) [id number]   History of cocaine use 10/01/2006   Hyperlipidemia associated with type 2 diabetes mellitus ([place]) 08/21/2005    Past Surgical History:  Procedure Laterality Date   ANTERIOR CERVICAL DECOMP/DISCECTOMY FUSION  06-24-2008  and 10-28-2008 '@[facility]    06-25-2008  C7 -- T1;    10-28-2008  C5 -- C7   CARDIAC CATHETERIZATION  08-24-2002   dr gamble  '@[facility]    normal coronaries   CARDIAC CATHETERIZATION  12-31-2007    dr Johnsie Cancel   nonobstructive CAD   CARPAL TUNNEL RELEASE Right 2000  approx.   CATARACT EXTRACTION W/ INTRAOCULAR LENS  IMPLANT, BILATERAL  2016   COLONOSCOPY  10/11/2011   severe diverticulosis, int hemorrhoids, rec rpt 5 yrs Carlean Purl)   COLONOSCOPY  12/2018   benign rectal polyp Carlean Purl)   DIRECT LARYNGOSCOPY  11/02/2008   '@[facility]    Extubation under anesthesia post op complication from ACDF   ESOPHAGOGASTRODUODENOSCOPY  10/11/2011   WNL Carlean Purl)   ESOPHAGOGASTRODUODENOSCOPY  09/11-2018   gastric ulcer, gastritis, dilated esophageal stenosis, limit NSAIDs continue PPI, no f/u planned Carlean Purl)   EXCISION MASS NECK N/A 08/29/2016   Procedure: EXCISION MASS POSTERIOR NECK;  Surgeon: Erroll Luna, MD;  Location: [facility];  Service: General;  Laterality: N/A;   HEMATOMA EVACUATION  10/29/2008   '@[facility]    post op ACDF 10-28-2008   LAPAROSCOPIC INGUINAL HERNIA REPAIR Bilateral 11-22-2006    '@[facility]'$    AND EXCISION RLQ ABDOMINAL LIPOMA   LEFT HEART CATH AND CORONARY ANGIOGRAPHY N/A 08/29/2018   Procedure: LEFT HEART CATH AND CORONARY ANGIOGRAPHY;  Surgeon: Jettie Booze, MD;  Location: [facility];  Service: Cardiovascular;  Laterality: N/A;   RADIOACTIVE SEED IMPLANT N/A 07/03/2019   Procedure: RADIOACTIVE SEED IMPLANT/BRACHYTHERAPY IMPLANT;  Surgeon: Ardis Hughs, MD;  Location: [facility];  Service: Urology;  Laterality: N/A;   SHOULDER ARTHROSCOPY WITH SUBACROMIAL DECOMPRESSION AND OPEN ROTATOR C Left 01/23/2013   Procedure: LEFT SHOULDER ARTHROSCOPY WITH SUBACROMIAL DECOMPRESSION AND MINI OPEN ROTATOR CUFF REPAIR, AND OPEN DISTAL CLAVICLE RESECTION;  Surgeon: Augustin Schooling, MD;  Location: [facility];  Service: Orthopedics;  Laterality: Left;   SHOULDER SURGERY Right 05/2011   SPACE OAR INSTILLATION N/A 07/03/2019   Procedure: SPACE OAR INSTILLATION;  Surgeon: Ardis Hughs, MD;  Location: [facility];  Service: Urology;  Laterality: N/A;   TONSILLECTOMY  child   TRIGGER FINGER RELEASE Right 12-12-2005   '@[facility]    Right long finger       Family History  Problem Relation Age of Onset   Diabetes Mother    Hypertension Mother    Hyperlipidemia Mother    Diabetes Father    Diabetes Sister    Hypertension Sister    Hypertension Brother    Diabetes Brother    Diabetes Brother    Hypertension Brother    Diabetes Brother    Heart disease Maternal Uncle        Heart failure   Cancer Paternal Uncle        Prostate   Cancer Paternal Grandfather        Prostate   Stroke Neg Hx    Esophageal cancer Neg Hx    Rectal cancer Neg Hx    Stomach cancer Neg Hx    Breast cancer Neg Hx    Colon cancer Neg Hx     Social History   Tobacco Use   Smoking status: Never   Smokeless tobacco: Never  Vaping Use   Vaping Use: Never used  Substance Use Topics   Alcohol use: No    Alcohol/week: 0.0 standard drinks   Drug use: Not  Currently    Comment: H/O marijuana (last 2005); Cocaine 5/04    Home Medications Prior to Admission medications   Medication Sig Start Date End Date Taking? Authorizing Provider  ammonium lactate (AMLACTIN) 12 % cream Apply topically in the morning and at bedtime.    [provider]  aspirin EC 81 MG tablet Take 81 mg by mouth daily. 10/25/11   Thurnell Lose, MD  atorvastatin (LIPITOR) 80 MG tablet TAKE 1 TABLET BY MOUTH  DAILY 10/17/20   Ria Bush, MD  baclofen (LIORESAL) 10 MG tablet TAKE 1 TABLET BY MOUTH AT  BEDTIME AS NEEDED FOR  MUSCLE SPASM 10/13/20   Ria Bush, MD  carvedilol (COREG) 3.125 MG tablet TAKE 1 TABLET BY MOUTH  TWICE DAILY WITH A MEAL 10/17/20   Ria Bush, MD  fluticasone [facility]) 50 MCG/ACT nasal spray USE 2 SPRAYS IN BOTH  NOSTRILS DAILY 12/30/19   Ria Bush, MD  gabapentin (NEURONTIN) 100 MG capsule TAKE 1 CAPSULE BY MOUTH TWO TIMES DAILY 12/31/19   Ria Bush, MD  glipiZIDE (GLUCOTROL XL) 5 MG 24 hr tablet TAKE 1 TABLET BY MOUTH  DAILY WITH BREAKFAST 02/10/20   Ria Bush, MD  guaiFENesin (MUCINEX) 600 MG 12 hr tablet Take 600 mg by mouth 2 (two) times daily as needed for cough.    [provider]  JARDIANCE 10 MG TABS tablet TAKE 1 TABLET BY MOUTH  DAILY 08/02/20   Ria Bush, MD  losartan (COZAAR) 50 MG tablet TAKE 1 TABLET BY MOUTH  DAILY 10/17/20   Ria Bush, MD  metFORMIN (GLUCOPHAGE) 1000 MG tablet Take 1 tablet (1,000 mg total) by mouth 2 (two) times daily with a meal. 07/15/20   Ria Bush, MD  omeprazole (PRILOSEC) 40 MG capsule TAKE 1 CAPSULE BY MOUTH  DAILY 10/17/20   Ria Bush, MD  [facility] ULTRA test strip USE WITH METER TO CHECK  BLOOD SUGAR TWICE DAILY 10/13/20   Ria Bush, MD  oxyCODONE-acetaminophen (PERCOCET) 10-325 MG tablet Take 1 tablet by mouth every 4 (four) hours as needed for pain. Patient taking differently: Take 1 tablet by  mouth every 4 (four) hours.  07/03/19   Ardis Hughs, MD  polyvinyl alcohol (LIQUIFILM TEARS) 1.4 % ophthalmic solution Place 1 drop into both eyes as needed for dry eyes.    [provider]    Allergies    Glimepiride and Menthol (topical analgesic)  Review of Systems   Review of Systems  Constitutional:  Negative for chills and fever.  HENT:  Negative for ear pain and sore throat.   Eyes:  Negative for pain and visual disturbance.  Respiratory:  Positive for cough. Negative for shortness of breath.   Cardiovascular:  Positive for chest pain. Negative for palpitations.  Gastrointestinal:  Negative for abdominal pain and vomiting.  Genitourinary:  Negative for dysuria and hematuria.  Musculoskeletal:  Negative for arthralgias and back pain.  Skin:  Negative for color change and rash.  Neurological:  Positive for dizziness and headaches. Negative for seizures and syncope.  All other systems reviewed and are negative.  Physical Exam Updated Vital Signs BP 136/70   Pulse 77   Temp 98.5 F (36.9 C) (Oral)   Resp 20   Ht 5' 7.75" (1.721 m)   Wt 104.8 kg   SpO2 100%   BMI 35.38 kg/m   Physical Exam Vitals and nursing note reviewed.  Constitutional:      General: He is not in acute distress.    Appearance: He is well-developed. He is not ill-appearing.  HENT:     Head: Normocephalic and atraumatic.     Mouth/Throat:     Mouth: Mucous membranes are moist.  Eyes:     Extraocular Movements: Extraocular movements intact.     Conjunctiva/sclera: Conjunctivae normal.     Pupils: Pupils are equal, round, and reactive to light.  Cardiovascular:     Rate and Rhythm: Normal rate and regular rhythm.     Pulses: Normal pulses.     Heart sounds: Normal heart sounds. No murmur heard. Pulmonary:     Effort: Pulmonary effort is normal. No respiratory distress.     Breath sounds: Normal breath sounds.  Abdominal:     Palpations: Abdomen is soft.     Tenderness: There is no abdominal tenderness.   Musculoskeletal:     Cervical back: Normal range of motion and neck supple.  Skin:    General: Skin is warm and dry.  Neurological:     General: No focal deficit present.     Mental Status: He is alert and oriented to person, place, and time.     Cranial Nerves: No cranial nerve deficit.     Sensory: No sensory deficit.     Motor: No weakness.     Coordination: Coordination normal.     Gait: Gait normal.     Comments: 5+ out of 5 strength throughout, normal sensation, no drift, normal finger-nose-finger, normal speech    ED Results / Procedures / Treatments   Labs (all labs ordered are listed, but only abnormal results are displayed) Labs Reviewed  BASIC METABOLIC PANEL - Abnormal; Notable for the following components:      Result Value   Glucose, Bld 143 (*)    All other components within normal limits  RESP PANEL BY RT-PCR (FLU A&B, COVID) ARPGX2  CBC WITH DIFFERENTIAL/PLATELET  LIPASE, BLOOD  TROPONIN I (HIGH SENSITIVITY)    EKG EKG Interpretation  Date/Time:  Monday November 21 2020 17:32:55 EDT Ventricular Rate:  71 PR Interval:  195 QRS Duration: 94 QT Interval:  369 QTC Calculation: 401 R  Axis:   -7 Text Interpretation: Sinus rhythm Confirmed by Lennice Sites ([id number]) on 11/21/2020 8:07:56 PM  Radiology DG Chest Portable 1 View  Result Date: 11/21/2020 CLINICAL DATA:  Nausea, productive cough, dizziness for 3 weeks EXAM: PORTABLE CHEST 1 VIEW COMPARISON:  03/07/2020 FINDINGS: Single frontal view of the chest demonstrates an unremarkable cardiac silhouette. No airspace disease, effusion, or pneumothorax. Stable postsurgical changes at the cervicothoracic junction. No acute bony abnormalities. IMPRESSION: 1. No acute intrathoracic process. Electronically Signed   By: Randa Ngo M.D.   On: 11/21/2020 20:22    Procedures Procedures   Medications Ordered in ED Medications  sodium chloride 0.9 % bolus 500 mL (500 mLs Intravenous Patient Refused/Not Given 11/21/20  2043)  oxyCODONE-acetaminophen (PERCOCET/ROXICET) 5-325 MG per tablet 2 tablet (2 tablets Oral Given 11/21/20 2033)    ED Course  I have reviewed the triage vital signs and the nursing notes.  Pertinent labs & imaging results that were available during my care of the patient were reviewed by me and considered in my medical decision making (see chart for details).    MDM Rules/Calculators/A&P                           [name] is here mostly for cough.  Concerned about his blood pressure is well.  Overall vitals are unremarkable except for some mild elevation his blood pressure.  He has had a cough for the last 4 days with some sputum production.  Has had some chest discomfort with this.  EKG shows sinus rhythm.  No ischemic changes.  Not having any active chest pain.  Does not seem cardiac in nature.  We will get a troponin.  Overall he appears very well.  He is not having any major respiratory symptoms or shortness of breath.  Could be a viral process.  Chest x-ray shows no pneumonia or pneumothorax.  Will check basic labs to evaluate for anemia.  Will give fluid bolus and will give him his home dose of Percocet.  Neurologically intact and no concern for stroke.  Lab work overall unremarkable.  Negative for COVID.  No pneumonia.  No significant anemia, electrolyte abnormality, kidney injury.  Troponin normal.  Overall do not believe there is any acute process going on.  No concern for ACS.  No obvious infectious process.  Recommend follow-up with primary care doctor.  This chart was dictated using voice recognition software.  Despite best efforts to proofread,  errors can occur which can change the documentation meaning.   Final Clinical Impression(s) / ED Diagnoses Final diagnoses:  Cough    Rx / DC Orders ED Discharge Orders     None        Lennice Sites, DO 11/21/20 2222

## 2020-11-21 NOTE — Telephone Encounter (Signed)
Noted. Will await ER eval.  

## 2020-11-22 NOTE — Telephone Encounter (Addendum)
Reassuring ER eval. Thought viral cough rec supportive care. Rec PCP f/u if ongoing symptoms.  COVID negative - so could see in office if needed for f/u.

## 2020-11-23 ENCOUNTER — Telehealth: Payer: Self-pay

## 2020-11-23 NOTE — Telephone Encounter (Signed)
Pt said he is doing fine now; pt said he solved his problem after going to [facility] ED on 11/21/20 and pt said he appreciated [place] insisting that he go to ED or he might not have realized his problem. Pt said the oxycodone apap 10-325 mg that [facility] gives him is a different manufacturer and he cannot take that one because it causes nervousness, dizziness and nausea. Pt said the oxycodone apap 10-325 mg from [facility] does not bother pt; pt said he is eating well now and doing fine. Pt said he has appt at pain mgt tomorrow and he is going to let them know pt cannot take the oxycodone apap 10-325 mg from [facility]. Pt wanted Dr Darnell Level to know that pt is doing fine now and again pt is appreciative of [facility] sending pt to ED. Sending note to Dr Darnell Level and Lattie Haw CMA.

## 2020-11-24 DIAGNOSIS — Z5181 Encounter for therapeutic drug level monitoring: Secondary | ICD-10-CM | POA: Diagnosis not present

## 2020-11-24 DIAGNOSIS — Z79891 Long term (current) use of opiate analgesic: Secondary | ICD-10-CM | POA: Diagnosis not present

## 2020-11-24 DIAGNOSIS — Z79899 Other long term (current) drug therapy: Secondary | ICD-10-CM | POA: Diagnosis not present

## 2020-12-10 ENCOUNTER — Other Ambulatory Visit: Payer: Self-pay

## 2020-12-10 ENCOUNTER — Emergency Department (HOSPITAL_COMMUNITY)
Admission: EM | Admit: 2020-12-10 | Discharge: 2020-12-10 | Disposition: A | Discharge status: Home / Self Care | Payer: Medicare Other | Attending: Emergency Medicine | Admitting: Emergency Medicine

## 2020-12-10 ENCOUNTER — Encounter (HOSPITAL_COMMUNITY): Payer: Self-pay

## 2020-12-10 DIAGNOSIS — Z79899 Other long term (current) drug therapy: Secondary | ICD-10-CM | POA: Diagnosis not present

## 2020-12-10 DIAGNOSIS — Z8546 Personal history of malignant neoplasm of prostate: Secondary | ICD-10-CM | POA: Insufficient documentation

## 2020-12-10 DIAGNOSIS — I2510 Atherosclerotic heart disease of native coronary artery without angina pectoris: Secondary | ICD-10-CM | POA: Insufficient documentation

## 2020-12-10 DIAGNOSIS — E1140 Type 2 diabetes mellitus with diabetic neuropathy, unspecified: Secondary | ICD-10-CM | POA: Diagnosis not present

## 2020-12-10 DIAGNOSIS — I10 Essential (primary) hypertension: Secondary | ICD-10-CM | POA: Diagnosis not present

## 2020-12-10 DIAGNOSIS — Z7984 Long term (current) use of oral hypoglycemic drugs: Secondary | ICD-10-CM | POA: Diagnosis not present

## 2020-12-10 DIAGNOSIS — R319 Hematuria, unspecified: Secondary | ICD-10-CM

## 2020-12-10 DIAGNOSIS — K625 Hemorrhage of anus and rectum: Secondary | ICD-10-CM

## 2020-12-10 DIAGNOSIS — Z7982 Long term (current) use of aspirin: Secondary | ICD-10-CM | POA: Diagnosis not present

## 2020-12-10 LAB — URINALYSIS, ROUTINE W REFLEX MICROSCOPIC
Bilirubin Urine: NEGATIVE
Glucose, UA: >=500 mg/dL — AB
Hgb urine dipstick: NEGATIVE
Ketones, ur: 5 mg/dL — AB
Leukocytes,Ua: NEGATIVE
Nitrite: NEGATIVE
Protein, ur: 30 mg/dL — AB
Specific Gravity, Urine: 1.012 (ref 1.005–1.030)
pH: 6 (ref 5.0–8.0)

## 2020-12-10 LAB — CBC WITH DIFFERENTIAL/PLATELET
Abs Immature Granulocytes: 0.02 10*3/uL (ref 0.00–0.07)
Basophils Absolute: 0 10*3/uL (ref 0.0–0.1)
Basophils Relative: 0 %
Eosinophils Absolute: 0.1 10*3/uL (ref 0.0–0.5)
Eosinophils Relative: 2 %
HCT: 42.9 % (ref 39.0–52.0)
Hemoglobin: 14.2 g/dL (ref 13.0–17.0)
Immature Granulocytes: 0 %
Lymphocytes Relative: 24 %
Lymphs Abs: 1.3 10*3/uL (ref 0.7–4.0)
MCH: 30.2 pg (ref 26.0–34.0)
MCHC: 33.1 g/dL (ref 30.0–36.0)
MCV: 91.3 fL (ref 80.0–100.0)
Monocytes Absolute: 0.6 10*3/uL (ref 0.1–1.0)
Monocytes Relative: 12 %
Neutro Abs: 3.3 10*3/uL (ref 1.7–7.7)
Neutrophils Relative %: 62 %
Platelets: 263 10*3/uL (ref 150–400)
RBC: 4.7 MIL/uL (ref 4.22–5.81)
RDW: 15.4 % (ref 11.5–15.5)
WBC: 5.4 10*3/uL (ref 4.0–10.5)
nRBC: 0 % (ref 0.0–0.2)

## 2020-12-10 LAB — COMPREHENSIVE METABOLIC PANEL
ALT: 19 U/L (ref 0–44)
AST: 13 U/L — ABNORMAL LOW (ref 15–41)
Albumin: 4.4 g/dL (ref 3.5–5.0)
Alkaline Phosphatase: 48 U/L (ref 38–126)
Anion gap: 9 (ref 5–15)
BUN: 10 mg/dL (ref 8–23)
CO2: 26 mmol/L (ref 22–32)
Calcium: 9 mg/dL (ref 8.9–10.3)
Chloride: 102 mmol/L (ref 98–111)
Creatinine, Ser: 0.82 mg/dL (ref 0.61–1.24)
GFR, Estimated: >60 mL/min (ref >60)
Glucose, Bld: 102 mg/dL — ABNORMAL HIGH (ref 70–99)
Potassium: 3.8 mmol/L (ref 3.5–5.1)
Sodium: 137 mmol/L (ref 135–145)
Total Bilirubin: 0.5 mg/dL (ref 0.3–1.2)
Total Protein: 7.9 g/dL (ref 6.5–8.1)

## 2020-12-10 LAB — LIPASE, BLOOD: Lipase: 32 U/L (ref 11–51)

## 2020-12-10 LAB — CBG MONITORING, ED: Glucose-Capillary: 89 mg/dL (ref 70–99)

## 2020-12-10 LAB — TYPE AND SCREEN
ABO/RH(D): O POS
Antibody Screen: NEGATIVE

## 2020-12-10 LAB — POC OCCULT BLOOD, ED: Fecal Occult Bld: NEGATIVE

## 2020-12-10 MED ORDER — OXYCODONE-ACETAMINOPHEN 5-325 MG PO TABS: 2.0000 | ORAL_TABLET | Freq: Once | ORAL | Status: DC

## 2020-12-10 NOTE — ED Notes (Signed)
PT IS AWARE THAT URINE SAMPLE IS NEEDED 

## 2020-12-10 NOTE — ED Provider Notes (Signed)
[facility] DEPT Provider Note   CSN: [id number] Arrival date & time: 12/10/20  1139     History Chief Complaint  Patient presents with   Rectal Bleeding    [name] is a 70 y.o. male.  HPI He presents for evaluation of intermittent bleeding in the urine, for 2 months.  He has already seen his urologist and had a cystoscopy to be evaluated.  He has a history of prostate cancer.  This morning he had a bowel movement and noticed bright red blood, dark blood both mixed in with the stool.  He denies abdominal pain, weakness or dizziness.  He does not take anticoagulants.  He denies fever, chills, nausea or vomiting/diarrhea.  No prior episodes of rectal bleeding.  He had a colonoscopy a couple of years ago.  There are no other known active modifying factors.    Past Medical History:  Diagnosis Date   Acquired dilation of ascending aorta and aortic root ([place])    63m ascending aorta   Allergic rhinitis    Anxiety    Atherosclerosis of aorta (HCC)    CAD (coronary artery disease) cardiologist-- dr tTressia Minersturner   long hx atypical chest pain--- cardiac cath 08-24-2002 in epic showed normal coronaries;   03-17-2007  nuclear stress test showed ?small infarct , ef 60%;  cath done 12-31-2007 for recurrent CP showed nonob cad;   08-27-2018 stress test showed intermediate risk w/ apex ischemia, nuclear ef 61%;   cath done 08-29-2018 showed Nonobstructive 25% plaque in LAD    Chronic neck pain    narcotic dependant   DDD (degenerative disc disease), cervical    Depression    Diabetic neuropathy ([place]    Diverticulosis 2013   by CT scan   Erectile dysfunction 08/01/2015   Essential hypertension 01/01/2008   Fatty liver 07/31/2018   By [place],  followed by pcp   GERD (gastroesophageal reflux disease)    Hemorrhoids    History of anemia    History of cocaine abuse ([place]    per pt quit 05/ 2004   History of cocaine use 2004   History of colon polyps     History of epididymitis    History of esophageal stricture    s/p  dilatation 2009;  12-30-2018   History of gastric ulcer    12-30-2018  EGD showed non-bleeding gastric ulcer   History of prostatitis 08/2011   History of transient ischemic attack (TIA)    remote hx , related to cocaine abuse which pt quit 2005   Hyperlipidemia    Hypertension    Essential   OSA (obstructive sleep apnea)    study 05-07-2008 in epic , mild ,  pt does not use cpap   Pain due to onychomycosis of toenails of both feet 10/15/2018   Prostate cancer ([facility] urologist--- dr herrick/  oncologist--- dr mTammi Klippel  first dx 01/ 2018 w/ Stage T2a and Gleason 3+3 , active survillance;  repear bx 04-21-2019 Gleason 3+4,  scheduled for brachytherapy 07-03-2019   T2DM (type 2 diabetes mellitus) ([place]    followed by pcp---  (03-04-201  checks blood sugar 3 times daily,  fasting sugar's-- 140-150    Patient Active Problem List   Diagnosis Date Noted   Osteoarthritis 07/27/2020   Acquired dilation of ascending aorta and aortic root ([place]    NSAID-induced gastric ulcer 01/07/2019   Pain due to onychomycosis of toenails of both feet 10/15/2018   CAD (coronary artery  disease) 09/03/2018   Abnormal stress test    Tongue lesion 08/05/2018   Fatty liver 07/31/2018   Lightheadedness 08/06/2017   Erectile dysfunction 08/01/2015   Health maintenance examination 01/31/2015   Gross hematuria    Lipoma of neck 10/01/2014   Allergic rhinitis 10/01/2014   Diabetic neuropathy ([place]) 05/31/2014   Advanced care planning/counseling discussion 01/26/2014   OSA (obstructive sleep apnea)    Tinea cruris 10/08/2012   Malignant neoplasm of prostate ([place]) 06/16/2012   Severe obesity (BMI 35.0-39.9) with comorbidity ([place]) 01/08/2012   Esophageal dysphagia 10/10/2011   Diverticulosis 10/09/2011   Male sexual dysfunction 09/11/2011   Insomnia 06/19/2011   Shoulder pain 04/08/2011   Neck pain, chronic 02/24/2011   Medicare annual  wellness visit, subsequent 10/26/2010   Iron deficiency anemia 07/01/2009   GERD 09/29/2008   Essential hypertension 01/01/2008   ANXIETY DEPRESSION 03/19/2007   TIA (transient ischemic attack) 03/05/2007   Back pain with radiculopathy 12/31/2006   ABDOMINAL WALL HERNIA 10/03/2006   Diabetes mellitus type 2, uncontrolled, with complications ([place]) [id number]   History of cocaine use 10/01/2006   Hyperlipidemia associated with type 2 diabetes mellitus ([place]) 08/21/2005    Past Surgical History:  Procedure Laterality Date   ANTERIOR CERVICAL DECOMP/DISCECTOMY FUSION  06-24-2008  and 10-28-2008 '@[facility]    06-25-2008  C7 -- T1;    10-28-2008  C5 -- C7   CARDIAC CATHETERIZATION  08-24-2002   dr gamble  '@[facility]'$    normal coronaries   CARDIAC CATHETERIZATION  12-31-2007    dr Johnsie Cancel   nonobstructive CAD   CARPAL TUNNEL RELEASE Right 2000  approx.   CATARACT EXTRACTION W/ INTRAOCULAR LENS  IMPLANT, BILATERAL  2016   COLONOSCOPY  10/11/2011   severe diverticulosis, int hemorrhoids, rec rpt 5 yrs Carlean Purl)   COLONOSCOPY  12/2018   benign rectal polyp Carlean Purl)   DIRECT LARYNGOSCOPY  11/02/2008   '@[facility]'$    Extubation under anesthesia post op complication from ACDF   ESOPHAGOGASTRODUODENOSCOPY  10/11/2011   WNL Carlean Purl)   ESOPHAGOGASTRODUODENOSCOPY  09/11-2018   gastric ulcer, gastritis, dilated esophageal stenosis, limit NSAIDs continue PPI, no f/u planned Carlean Purl)   EXCISION MASS NECK N/A 08/29/2016   Procedure: EXCISION MASS POSTERIOR NECK;  Surgeon: Erroll Luna, MD;  Location: [facility];  Service: General;  Laterality: N/A;   HEMATOMA EVACUATION  10/29/2008   '@[facility]'$    post op ACDF 10-28-2008   LAPAROSCOPIC INGUINAL HERNIA REPAIR Bilateral 11-22-2006   '@[facility]'$    AND EXCISION RLQ ABDOMINAL LIPOMA   LEFT HEART CATH AND CORONARY ANGIOGRAPHY N/A 08/29/2018   Procedure: LEFT HEART CATH AND CORONARY ANGIOGRAPHY;  Surgeon: Jettie Booze, MD;  Location: [facility];  Service:  Cardiovascular;  Laterality: N/A;   RADIOACTIVE SEED IMPLANT N/A 07/03/2019   Procedure: RADIOACTIVE SEED IMPLANT/BRACHYTHERAPY IMPLANT;  Surgeon: Ardis Hughs, MD;  Location: [facility];  Service: Urology;  Laterality: N/A;   SHOULDER ARTHROSCOPY WITH SUBACROMIAL DECOMPRESSION AND OPEN ROTATOR C Left 01/23/2013   Procedure: LEFT SHOULDER ARTHROSCOPY WITH SUBACROMIAL DECOMPRESSION AND MINI OPEN ROTATOR CUFF REPAIR, AND OPEN DISTAL CLAVICLE RESECTION;  Surgeon: Augustin Schooling, MD;  Location: [facility];  Service: Orthopedics;  Laterality: Left;   SHOULDER SURGERY Right 05/2011   SPACE OAR INSTILLATION N/A 07/03/2019   Procedure: SPACE OAR INSTILLATION;  Surgeon: Ardis Hughs, MD;  Location[facility];  Service: Urology;  Laterality: N/A;   TONSILLECTOMY  child   TRIGGER FINGER RELEASE Right 12-12-2005   '@[facility]'$   Right long finger       Family History  Problem Relation Age of Onset   Diabetes Mother    Hypertension Mother    Hyperlipidemia Mother    Diabetes Father    Diabetes Sister    Hypertension Sister    Hypertension Brother    Diabetes Brother    Diabetes Brother    Hypertension Brother    Diabetes Brother    Heart disease Maternal Uncle        Heart failure   Cancer Paternal Uncle        Prostate   Cancer Paternal Grandfather        Prostate   Stroke Neg Hx    Esophageal cancer Neg Hx    Rectal cancer Neg Hx    Stomach cancer Neg Hx    Breast cancer Neg Hx    Colon cancer Neg Hx     Social History   Tobacco Use   Smoking status: Never   Smokeless tobacco: Never  Vaping Use   Vaping Use: Never used  Substance Use Topics   Alcohol use: No    Alcohol/week: 0.0 standard drinks   Drug use: Not Currently    Comment: H/O marijuana (last 2005); Cocaine 5/04    Home Medications Prior to Admission medications   Medication Sig Start Date End Date Taking? Authorizing Provider  ammonium lactate (AMLACTIN) 12 % cream Apply  topically in the morning and at bedtime.    [provider]  aspirin EC 81 MG tablet Take 81 mg by mouth daily. 10/25/11   Thurnell Lose, MD  atorvastatin (LIPITOR) 80 MG tablet TAKE 1 TABLET BY MOUTH  DAILY 10/17/20   Ria Bush, MD  baclofen (LIORESAL) 10 MG tablet TAKE 1 TABLET BY MOUTH AT  BEDTIME AS NEEDED FOR  MUSCLE SPASM 10/13/20   Ria Bush, MD  carvedilol (COREG) 3.125 MG tablet TAKE 1 TABLET BY MOUTH  TWICE DAILY WITH A MEAL 10/17/20   Ria Bush, MD  fluticasone [facility]) 50 MCG/ACT nasal spray USE 2 SPRAYS IN BOTH  NOSTRILS DAILY 12/30/19   Ria Bush, MD  gabapentin (NEURONTIN) 100 MG capsule TAKE 1 CAPSULE BY MOUTH TWO TIMES DAILY 12/31/19   Ria Bush, MD  glipiZIDE (GLUCOTROL XL) 5 MG 24 hr tablet TAKE 1 TABLET BY MOUTH  DAILY WITH BREAKFAST 02/10/20   Ria Bush, MD  guaiFENesin (MUCINEX) 600 MG 12 hr tablet Take 600 mg by mouth 2 (two) times daily as needed for cough.    [provider]  JARDIANCE 10 MG TABS tablet TAKE 1 TABLET BY MOUTH  DAILY 08/02/20   Ria Bush, MD  losartan (COZAAR) 50 MG tablet TAKE 1 TABLET BY MOUTH  DAILY 10/17/20   Ria Bush, MD  metFORMIN (GLUCOPHAGE) 1000 MG tablet Take 1 tablet (1,000 mg total) by mouth 2 (two) times daily with a meal. 07/15/20   Ria Bush, MD  omeprazole (PRILOSEC) 40 MG capsule TAKE 1 CAPSULE BY MOUTH  DAILY 10/17/20   Ria Bush, MD  [facility] ULTRA test strip USE WITH METER TO CHECK  BLOOD SUGAR TWICE DAILY 10/13/20   Ria Bush, MD  oxyCODONE-acetaminophen (PERCOCET) 10-325 MG tablet Take 1 tablet by mouth every 4 (four) hours as needed for pain. Patient taking differently: Take 1 tablet by mouth every 4 (four) hours. 07/03/19   Ardis Hughs, MD  polyvinyl alcohol (LIQUIFILM TEARS) 1.4 % ophthalmic solution Place 1 drop into both eyes as needed for dry eyes.  [provider]    Allergies    Glimepiride and Menthol (topical  analgesic)  Review of Systems   Review of Systems  All other systems reviewed and are negative.  Physical Exam Updated Vital Signs BP 138/76   Pulse 75   Temp 98.5 F (36.9 C) (Oral)   Resp 16   SpO2 99%   Physical Exam Vitals and nursing note reviewed.  Constitutional:      General: He is not in acute distress.    Appearance: He is well-developed. He is not ill-appearing, toxic-appearing or diaphoretic.  HENT:     Head: Normocephalic and atraumatic.     Right Ear: External ear normal.     Left Ear: External ear normal.  Eyes:     Conjunctiva/sclera: Conjunctivae normal.     Pupils: Pupils are equal, round, and reactive to light.  Neck:     Trachea: Phonation normal.  Cardiovascular:     Rate and Rhythm: Normal rate and regular rhythm.     Heart sounds: Normal heart sounds.  Pulmonary:     Effort: Pulmonary effort is normal.     Breath sounds: Normal breath sounds.  Abdominal:     General: There is no distension.     Palpations: Abdomen is soft.     Tenderness: There is no abdominal tenderness.  Genitourinary:    Comments: Normal male genitalia, no bleeding from the urethral meatus.  Normal anus.  Small amount of greenish-brown stool in the rectal vault.  No rectal mass.  No anal fissure, no hemorrhoids Musculoskeletal:        General: Normal range of motion.     Cervical back: Normal range of motion and neck supple.  Skin:    General: Skin is warm and dry.     Coloration: Skin is not jaundiced.  Neurological:     Mental Status: He is alert and oriented to person, place, and time.     Cranial Nerves: No cranial nerve deficit.     Sensory: No sensory deficit.     Motor: No abnormal muscle tone.     Coordination: Coordination normal.  Psychiatric:        Mood and Affect: Mood normal.        Behavior: Behavior normal.        Thought Content: Thought content normal.        Judgment: Judgment normal.    ED Results / Procedures / Treatments   Labs (all labs  ordered are listed, but only abnormal results are displayed) Labs Reviewed  COMPREHENSIVE METABOLIC PANEL - Abnormal; Notable for the following components:      Result Value   Glucose, Bld 102 (*)    AST 13 (*)    All other components within normal limits  URINALYSIS, ROUTINE W REFLEX MICROSCOPIC - Abnormal; Notable for the following components:   Glucose, UA >=500 (*)    Ketones, ur 5 (*)    Protein, ur 30 (*)    Bacteria, UA RARE (*)    All other components within normal limits  CBC WITH DIFFERENTIAL/PLATELET  LIPASE, BLOOD  POC OCCULT BLOOD, ED  CBG MONITORING, ED  TYPE AND SCREEN    EKG None  Radiology No results found.  Procedures Procedures   Medications Ordered in ED Medications  oxyCODONE-acetaminophen (PERCOCET/ROXICET) 5-325 MG per tablet 2 tablet (has no administration in time range)    ED Course  I have reviewed the triage vital signs and the nursing notes.  Pertinent labs &  imaging results that were available during my care of the patient were reviewed by me and considered in my medical decision making (see chart for details).    MDM Rules/Calculators/A&P                            Patient Vitals for the past 24 hrs:  BP Temp Temp src Pulse Resp SpO2  12/10/20 1832 138/76 -- -- 75 16 99 %  12/10/20 1705 (!) 155/81 -- -- 74 18 100 %  12/10/20 1600 (!) 180/112 -- -- 83 18 98 %  12/10/20 1229 (!) 139/96 98.5 F (36.9 C) Oral 67 16 100 %    7:14 PM Reevaluation with update and discussion. After initial assessment and treatment, an updated evaluation reveals he states he has chronic pain and needs to take oxycodone every 4 hours.  I will give him a dose.  Findings were discussed with the patient and all questions were answered. Daleen Bo   Medical Decision Making:  This patient is presenting for evaluation of bleeding in urine and rectal, which does require a range of treatment options, and is a complaint that involves a moderate risk of morbidity  and mortality. The differential diagnoses include ear infection, bleeding disorder, anal disorder, rectal disorder. I decided to review old records, and in summary elderly male with history of prostate cancer and colon polyp, and diverticular disease of the colon, presenting with bleeding from urine, and exam..  I did not require additional historical information from anyone.  Clinical Laboratory Tests Ordered, included CBC, Metabolic panel, and lipase, urinalysis . Review indicates normal except glucose slightly elevated and serum and urine, AST low.  Of note, no blood in stool or urine.   Critical Interventions-clinical evaluation, laboratory testing, observation and reassessment  After These Interventions, the Patient was reevaluated and was found stable for discharge.  Patient with complaint of blood in urine and stool, none found on exam.  Hemodynamically stable with normal hemoglobin.  No evidence for serious medical disorders at this time.  He can be managed as an outpatient.  CRITICAL CARE-no Performed by: Daleen Bo  Nursing Notes Reviewed/ Care Coordinated Applicable Imaging Reviewed Interpretation of Laboratory Data incorporated into ED treatment  The patient appears reasonably screened and/or stabilized for discharge and I doubt any other medical condition or other [facility] requiring further screening, evaluation, or treatment in the ED at this time prior to discharge.  Plan: Home Medications-continue usual; Home Treatments-regular diet and activity; return here if the recommended treatment, does not improve the symptoms; Recommended follow up-PCP, as needed     Final Clinical Impression(s) / ED Diagnoses Final diagnoses:  Rectal bleeding  Hematuria, unspecified type    Rx / DC Orders ED Discharge Orders     None        Daleen Bo, MD 12/10/20 1918

## 2020-12-10 NOTE — ED Triage Notes (Addendum)
Pt arrived via POV, c/o rectal bleeding new today dark red blood, and penile shaft bleeding several months, worsening.

## 2020-12-10 NOTE — Discharge Instructions (Addendum)
There was no blood found in the urine or stool today.  Continue taking your usual medicines, and eat a regular diet.  Follow-up with your doctor as needed for problems.

## 2020-12-10 NOTE — ED Provider Notes (Signed)
Emergency Medicine Provider Triage Evaluation Note  KONAN HIROTA , a 70 y.o. male  was evaluated in triage.  Pt complains of rectal bleeding and hematuria.  Hematuria started on Wednesday and has been consistent since then.  Patient also endorses dysuria.  Patient reports he had episode of rectal bleeding today.  Patient noted bright red and dark red blood mixed in with his stool.  Patient denies any melena.  Patient also endorses lower abdominal discomfort.  Review of Systems  Positive: Hematuria, dysuria, blood in stool, abdominal pain Negative: Flank pain, fevers, chills, nausea, vomiting  Physical Exam  BP (!) 139/96 (BP Location: Right Arm)   Pulse 67   Temp 98.5 F (36.9 C) (Oral)   Resp 16   SpO2 100%  Gen:   Awake, no distress   Resp:  Normal effort  MSK:   Moves extremities without difficulty  Other:  Abdomen soft, nondistended, nontender.  Medical Decision Making  Medically screening exam initiated at 1:02 PM.  Appropriate orders placed.  Lawerance Bach Brislin was informed that the remainder of the evaluation will be completed by another provider, this initial triage assessment does not replace that evaluation, and the importance of remaining in the ED until their evaluation is complete.  The patient appears stable so that the remainder of the work up may be completed by another provider.      Loni Beckwith, PA-C 12/10/20 1304    Milton Ferguson, MD 12/12/20 [phone]

## 2020-12-12 ENCOUNTER — Telehealth: Payer: Self-pay

## 2020-12-12 NOTE — Chronic Care Management (AMB) (Addendum)
Chronic Care Management Pharmacy Assistant   Name: [name]  MRN: [id number] DOB: Oct 26, 1950  Reason for Encounter: Diabetes  Recent office visits:  11/02/20 - PCP - Patient presented for AWV. Will see about assistance for Jardiance. Follow up 3 months.   Recent consult visits:  10/12/20 - Podiatry - Patient presented for toenail trimming. Continue applying ammonium lactate lotion twice a day.  Hospital visits:  12/10/20 - [facility] ED - Patient presented with rectal bleeding. CMP,CBC,LIPASE ordered, prescribed Oxycodone 5/325 2 tablets. No admission. 11/21/20 - [facility] ED - Patient presented with a cough. Labs ordered, IV fluids, Oxycodone 5/325 2 tablets given. No admission.  Medications: Outpatient Encounter Medications as of 12/12/2020  Medication Sig   ammonium lactate (AMLACTIN) 12 % cream Apply topically in the morning and at bedtime.   aspirin EC 81 MG tablet Take 81 mg by mouth daily.   atorvastatin (LIPITOR) 80 MG tablet TAKE 1 TABLET BY MOUTH  DAILY   baclofen (LIORESAL) 10 MG tablet TAKE 1 TABLET BY MOUTH AT  BEDTIME AS NEEDED FOR  MUSCLE SPASM   carvedilol (COREG) 3.125 MG tablet TAKE 1 TABLET BY MOUTH  TWICE DAILY WITH A MEAL   fluticasone (FLONASE) 50 MCG/ACT nasal spray USE 2 SPRAYS IN BOTH  NOSTRILS DAILY   gabapentin (NEURONTIN) 100 MG capsule TAKE 1 CAPSULE BY MOUTH TWO TIMES DAILY   glipiZIDE (GLUCOTROL XL) 5 MG 24 hr tablet TAKE 1 TABLET BY MOUTH  DAILY WITH BREAKFAST   guaiFENesin (MUCINEX) 600 MG 12 hr tablet Take 600 mg by mouth 2 (two) times daily as needed for cough.   JARDIANCE 10 MG TABS tablet TAKE 1 TABLET BY MOUTH  DAILY   losartan (COZAAR) 50 MG tablet TAKE 1 TABLET BY MOUTH  DAILY   metFORMIN (GLUCOPHAGE) 1000 MG tablet Take 1 tablet (1,000 mg total) by mouth 2 (two) times daily with a meal.   omeprazole (PRILOSEC) 40 MG capsule TAKE 1 CAPSULE BY MOUTH  DAILY   ONETOUCH ULTRA test strip USE WITH METER TO CHECK  BLOOD  SUGAR TWICE DAILY   oxyCODONE-acetaminophen (PERCOCET) 10-325 MG tablet Take 1 tablet by mouth every 4 (four) hours as needed for pain. (Patient taking differently: Take 1 tablet by mouth every 4 (four) hours.)   polyvinyl alcohol (LIQUIFILM TEARS) 1.4 % ophthalmic solution Place 1 drop into both eyes as needed for dry eyes.   Facility-Administered Encounter Medications as of 12/12/2020  Medication   triamcinolone (KENALOG) 0.1 % cream    Recent Relevant Labs: Lab Results  Component Value Date/Time   HGBA1C 7.4 (H) 10/26/2020 10:27 AM   HGBA1C 7.5 (A) 05/13/2020 12:07 PM   HGBA1C 7.4 (A) 03/09/2020 02:48 PM   HGBA1C 7.3 (H) 10/30/2019 07:53 AM   MICROALBUR 3.2 (H) 06/16/2012 09:23 AM   MICROALBUR 0.1 10/24/2010 08:01 AM    Kidney Function Lab Results  Component Value Date/Time   CREATININE 0.82 12/10/2020 01:39 PM   CREATININE 0.90 11/21/2020 08:41 PM   CREATININE 0.99 12/26/2012 02:43 PM   GFR 80.99 10/26/2020 10:27 AM   GFRNONAA >60 12/10/2020 01:39 PM   GFRAA >60 12/13/2019 11:40 AM    Contacted patient on 12/12/20 to discuss diabetes disease state.   Current antihyperglycemic regimen:  Metformin 1000 mg - 1 tablet twice daily (breakfast and afternoon) Jardiance 10 mg - 1 tablet before breakfast  Glipizide 5 mg XL - 1 tablet daily with breakfast    Patient verbally confirms he  is taking the above medications as directed. Yes  What diet changes have been made to improve diabetes control? the patient reports choosing meals of fresh vegtables, with low sugars, low carbohydrates  What recent interventions/DTPs have been made to improve glycemic control:   The patient is getting PAP for Jardiance '10mg'$    Have there been any recent hospitalizations or ED visits since last visit with CPP? Yes 12/10/20- [facility] ED- Patient presented with rectal bleeding-CMP,CBC,LIPASE ordered, prescribed Oxycodone 5/325 2 tablets -no admission 11/21/20- [facility] ED- Patient  presented with a cough.Labs ordered, IV fluids, Oxycodone 5/325 2 tablets given- no admission  Patient denies hypoglycemic symptoms, including Pale, Sweaty, Shaky, Hungry, Nervous/irritable, and Vision changes  Patient denies hyperglycemic symptoms, including blurry vision, excessive thirst, fatigue, polyuria, and weakness  How often are you checking your blood sugar? in the morning before eating or drinking  What are your blood sugars ranging?  Fasting: 109, 89, 101  During the week, how often does your blood glucose drop below 70? Never  Are you checking your feet daily/regularly? Yes  Adherence Review: Is the patient currently on a STATIN medication? Yes Is the patient currently on ACE/ARB medication? Yes Does the patient have >5 day gap between last estimated fill dates? No  Care Gaps: Last annual wellness visit: 11/02/20 Last eye exam / retinopathy screening:up to date [place] 07/2020 Last diabetic foot exam:07/29/20  Counseled patient on importance of annual eye and foot exam.   Star Rating Drugs:  Medication:  Last Fill: Day Supply Metformin '1000mg'$  08/08/20 90 (refill 11/10/20 [facility]) 90DS Jardiance '10mg'$  PAP    Patient has to call for RF's Glipizide '5mg'$  XL 08/16/20 90(refill  11/10/20 [facility])90DS Atorvastatin '80mg'$  08/16/20 90(refill 11/10/20 [facility])90DS Losartan '50mg'$  08/16/20 90(refill 11/10/20 [facility])90DS  Upcoming appts: PCP appointment on 02/11/21  [name], CPP notified  Avel Sensor, [place] Assistant [phone]  I have reviewed the care management and care coordination activities outlined in this encounter and I am certifying that I agree with the content of this note. No further action required.  [name], PharmD Clinical Pharmacist [facility] [phone]

## 2020-12-19 ENCOUNTER — Other Ambulatory Visit: Payer: Self-pay | Admitting: *Deleted

## 2020-12-19 MED ORDER — BACLOFEN 10 MG PO TABS: ORAL_TABLET | ORAL | 1 refills | Status: DC

## 2020-12-19 NOTE — Telephone Encounter (Signed)
ERx 

## 2020-12-19 NOTE — Telephone Encounter (Signed)
Patient called stating that he needs a refill on his Baclofen. Patient stated the last time it was refilled he was only given a 30 day supply. Patient stated that he usually gets a 90 day supply. Patient stated that he only has a few days left. Patient requested that a 90 day supply be sent in. Last refill 09/23/20 #30 Last office visit 11/02/20  Pharmacy [facility]

## 2020-12-22 DIAGNOSIS — I700 Atherosclerosis of aorta: Secondary | ICD-10-CM | POA: Diagnosis not present

## 2020-12-22 DIAGNOSIS — K5730 Diverticulosis of large intestine without perforation or abscess without bleeding: Secondary | ICD-10-CM | POA: Diagnosis not present

## 2020-12-22 DIAGNOSIS — R310 Gross hematuria: Secondary | ICD-10-CM | POA: Diagnosis not present

## 2021-01-02 ENCOUNTER — Telehealth: Payer: Self-pay

## 2021-01-02 NOTE — Progress Notes (Addendum)
    Chronic Care Management Pharmacy Assistant   Name: [name]  MRN: [id number] DOB: [birth year]  Reason for Encounter: Appointment Reminder   Medications: Outpatient Encounter Medications as of 01/02/2021  Medication Sig   ammonium lactate (AMLACTIN) 12 % cream Apply topically in the morning and at bedtime.   aspirin EC 81 MG tablet Take 81 mg by mouth daily.   atorvastatin (LIPITOR) 80 MG tablet TAKE 1 TABLET BY MOUTH  DAILY   baclofen (LIORESAL) 10 MG tablet TAKE 1 TABLET BY MOUTH AT  BEDTIME AS NEEDED FOR  MUSCLE SPASM   carvedilol (COREG) 3.125 MG tablet TAKE 1 TABLET BY MOUTH  TWICE DAILY WITH A MEAL   fluticasone (FLONASE) 50 MCG/ACT nasal spray USE 2 SPRAYS IN BOTH  NOSTRILS DAILY   gabapentin (NEURONTIN) 100 MG capsule TAKE 1 CAPSULE BY MOUTH TWO TIMES DAILY   glipiZIDE (GLUCOTROL XL) 5 MG 24 hr tablet TAKE 1 TABLET BY MOUTH  DAILY WITH BREAKFAST   guaiFENesin (MUCINEX) 600 MG 12 hr tablet Take 600 mg by mouth 2 (two) times daily as needed for cough.   JARDIANCE 10 MG TABS tablet TAKE 1 TABLET BY MOUTH  DAILY   losartan (COZAAR) 50 MG tablet TAKE 1 TABLET BY MOUTH  DAILY   metFORMIN (GLUCOPHAGE) 1000 MG tablet Take 1 tablet (1,000 mg total) by mouth 2 (two) times daily with a meal.   omeprazole (PRILOSEC) 40 MG capsule TAKE 1 CAPSULE BY MOUTH  DAILY   ONETOUCH ULTRA test strip USE WITH METER TO CHECK  BLOOD SUGAR TWICE DAILY   oxyCODONE-acetaminophen (PERCOCET) 10-325 MG tablet Take 1 tablet by mouth every 4 (four) hours as needed for pain. (Patient taking differently: Take 1 tablet by mouth every 4 (four) hours.)   polyvinyl alcohol (LIQUIFILM TEARS) 1.4 % ophthalmic solution Place 1 drop into both eyes as needed for dry eyes.   Facility-Administered Encounter Medications as of 01/02/2021  Medication   triamcinolone (KENALOG) 0.1 % cream   Voicemail was left for [name] to remind him of his upcoming telephone visit with [name] on 01/09/2021 at 12:00  pm. Patient was reminded to have all medications, supplements and any blood glucose and blood pressure readings available for review at appointment.  Star Rating Drugs: Medication:  Last Fill: Day Supply Metformin '1000mg'$       11/15/2020      90 Jardiance '10mg'$           PAP                 Patient has to call for RF's Glipizide '5mg'$  XL         11/15/2020      90 Atorvastatin '80mg'$        11/15/2020      90 Losartan '50mg'$             11/15/2020      90 All last fill dates were verified with [facility] on 01/02/2021  [name], CPP notified  Marijean Niemann, English Assistant [phone]   Time Spent: 26 minutes (Verified prescription fill dates with [facility])  I have reviewed the care management and care coordination activities outlined in this encounter and I am certifying that I agree with the content of this note. No further action required.  [name], PharmD Clinical Pharmacist [facility] [phone]

## 2021-01-04 ENCOUNTER — Other Ambulatory Visit: Payer: Self-pay

## 2021-01-04 ENCOUNTER — Ambulatory Visit: Payer: Medicare Other | Admitting: Podiatry

## 2021-01-04 DIAGNOSIS — L988 Other specified disorders of the skin and subcutaneous tissue: Secondary | ICD-10-CM | POA: Diagnosis not present

## 2021-01-04 DIAGNOSIS — E1142 Type 2 diabetes mellitus with diabetic polyneuropathy: Secondary | ICD-10-CM | POA: Diagnosis not present

## 2021-01-04 NOTE — Progress Notes (Signed)
Subjective:  Patient ID: [name], male    DOB: 06-Apr-1951,  MRN: [id number]  Chief Complaint  Patient presents with   Diabetes    Sore between 4th and 5th toe of left foot     70 y.o. male presents with the above complaint.  Patient presents with complaint of left fourth interdigital space maceration.  Patient is a diabetic with A1c of 7.4.  Patient states that sometimes painful.  He states is been putting a lot of lotion in between the toes.  This may lead to the macerated skin.  He does not have any ulcer.  He denies any other acute complaints and like to discuss treatment options for this.   Review of Systems: Negative except as noted in the HPI. Denies N/V/F/Ch.  Past Medical History:  Diagnosis Date   Acquired dilation of ascending aorta and aortic root ([place])    53m ascending aorta   Allergic rhinitis    Anxiety    Atherosclerosis of aorta (HCC)    CAD (coronary artery disease) cardiologist-- dr tTressia Minersturner   long hx atypical chest pain--- cardiac cath 08-24-2002 in epic showed normal coronaries;   03-17-2007  nuclear stress test showed ?small infarct , ef 60%;  cath done 12-31-2007 for recurrent CP showed nonob cad;   08-27-2018 stress test showed intermediate risk w/ apex ischemia, nuclear ef 61%;   cath done 08-29-2018 showed Nonobstructive 25% plaque in LAD    Chronic neck pain    narcotic dependant   DDD (degenerative disc disease), cervical    Depression    Diabetic neuropathy [place]    Diverticulosis 2013   by CT scan   Erectile dysfunction 08/01/2015   Essential hypertension 01/01/2008   Fatty liver 07/31/2018   [place],  followed by pcp   GERD (gastroesophageal reflux disease)    Hemorrhoids    History of anemia    History of cocaine abuse ([place]    per pt quit 05/ 2004   History of cocaine use 2004   History of colon polyps    History of epididymitis    History of esophageal stricture    s/p  dilatation 2009;  12-30-2018   History of gastric  ulcer    12-30-2018  EGD showed non-bleeding gastric ulcer   History of prostatitis 08/2011   History of transient ischemic attack (TIA)    remote hx , related to cocaine abuse which pt quit 2005   Hyperlipidemia    Hypertension    Essential   OSA (obstructive sleep apnea)    study 05-07-2008 in epic , mild ,  pt does not use cpap   Pain due to onychomycosis of toenails of both feet 10/15/2018   Prostate cancer ([facility] urologist--- dr herrick/  oncologist--- dr mTammi Klippel  first dx 01/ 2018 w/ Stage T2a and Gleason 3+3 , active survillance;  repear bx 04-21-2019 Gleason 3+4,  scheduled for brachytherapy 07-03-2019   T2DM (type 2 diabetes mellitus) ([place]    followed by pcp---  (03-04-201  checks blood sugar 3 times daily,  fasting sugar's-- 140-150    Current Outpatient Medications:    ammonium lactate (AMLACTIN) 12 % cream, Apply topically in the morning and at bedtime., Disp: , Rfl:    aspirin EC 81 MG tablet, Take 81 mg by mouth daily., Disp: , Rfl:    atorvastatin (LIPITOR) 80 MG tablet, TAKE 1 TABLET BY MOUTH  DAILY, Disp: 90 tablet, Rfl: 0   baclofen (LIORESAL) 10 MG  tablet, TAKE 1 TABLET BY MOUTH AT  BEDTIME AS NEEDED FOR  MUSCLE SPASM, Disp: 90 tablet, Rfl: 1   carvedilol (COREG) 3.125 MG tablet, TAKE 1 TABLET BY MOUTH  TWICE DAILY WITH A MEAL, Disp: 180 tablet, Rfl: 0   fluticasone (FLONASE) 50 MCG/ACT nasal spray, USE 2 SPRAYS IN BOTH  NOSTRILS DAILY, Disp: 48 g, Rfl: 3   gabapentin (NEURONTIN) 100 MG capsule, TAKE 1 CAPSULE BY MOUTH TWO TIMES DAILY, Disp: 180 capsule, Rfl: 3   glipiZIDE (GLUCOTROL XL) 5 MG 24 hr tablet, TAKE 1 TABLET BY MOUTH  DAILY WITH BREAKFAST, Disp: 90 tablet, Rfl: 3   guaiFENesin (MUCINEX) 600 MG 12 hr tablet, Take 600 mg by mouth 2 (two) times daily as needed for cough., Disp: , Rfl:    JARDIANCE 10 MG TABS tablet, TAKE 1 TABLET BY MOUTH  DAILY, Disp: 90 tablet, Rfl: 3   losartan (COZAAR) 50 MG tablet, TAKE 1 TABLET BY MOUTH  DAILY, Disp: 90 tablet, Rfl: 0    metFORMIN (GLUCOPHAGE) 1000 MG tablet, Take 1 tablet (1,000 mg total) by mouth 2 (two) times daily with a meal., Disp: 180 tablet, Rfl: 3   omeprazole (PRILOSEC) 40 MG capsule, TAKE 1 CAPSULE BY MOUTH  DAILY, Disp: 90 capsule, Rfl: 0   ONETOUCH ULTRA test strip, USE WITH METER TO CHECK  BLOOD SUGAR TWICE DAILY, Disp: 200 strip, Rfl: 3   oxyCODONE-acetaminophen (PERCOCET) 10-325 MG tablet, Take 1 tablet by mouth every 4 (four) hours as needed for pain. (Patient taking differently: Take 1 tablet by mouth every 4 (four) hours.), Disp: 10 tablet, Rfl: 0   polyvinyl alcohol (LIQUIFILM TEARS) 1.4 % ophthalmic solution, Place 1 drop into both eyes as needed for dry eyes., Disp: , Rfl:   Current Facility-Administered Medications:    triamcinolone (KENALOG) 0.1 % cream, , Topical, BID, Regal, Tamala Fothergill, DPM  Social History   Tobacco Use  Smoking Status Never  Smokeless Tobacco Never    Allergies  Allergen Reactions   Glimepiride Other (See Comments)    Headache, back pain   Menthol (Topical Analgesic) Rash    Burning rash   Objective:  There were no vitals filed for this visit. There is no height or weight on file to calculate BMI. Constitutional Well developed. Well nourished.  Vascular Dorsalis pedis pulses palpable bilaterally. Posterior tibial pulses palpable bilaterally. Capillary refill normal to all digits.  No cyanosis or clubbing noted. Pedal hair growth normal.  Neurologic Normal speech. Oriented to person, place, and time. Epicritic sensation to light touch grossly present bilaterally.  Dermatologic Interdigital maceration noted between the fourth and fifth digit.  Mild pain on palpation.  No ulceration noted no soft tissue defect noted.  No signs of infection noted.  Orthopedic: Normal joint ROM without pain or crepitus bilaterally. No visible deformities. No bony tenderness.   Radiographs: None Assessment:   1. Skin maceration   2. Diabetic polyneuropathy associated  with type 2 diabetes mellitus ([place])    Plan:  Patient was evaluated and treated and all questions answered.  Left fourth interdigital space maceration between fourth and fifth digit -I explained to the patient the etiology of skin maceration and various treatment options were extensively discussed.  Given that he has been keeping it very wet in between the toes is likely the culprit for the maceration.  I encouraged him to keep it dry in between the toes.  To stop using lotion in between the toes he can continue applying to the  rest of the foot.  He states understanding. -I encouraged him to do Betadine paint in between the toes to keep it nice and dry.  He states understanding will do it once a day.  No follow-ups on file.

## 2021-01-09 ENCOUNTER — Telehealth: Payer: Self-pay

## 2021-01-09 ENCOUNTER — Ambulatory Visit (INDEPENDENT_AMBULATORY_CARE_PROVIDER_SITE_OTHER): Payer: Medicare Other

## 2021-01-09 ENCOUNTER — Other Ambulatory Visit: Payer: Self-pay

## 2021-01-09 DIAGNOSIS — I10 Essential (primary) hypertension: Secondary | ICD-10-CM

## 2021-01-09 DIAGNOSIS — E118 Type 2 diabetes mellitus with unspecified complications: Secondary | ICD-10-CM

## 2021-01-09 NOTE — Progress Notes (Signed)
Chronic Care Management Pharmacy Note  01/09/21 Name:  [name] MRN:  [id number] DOB:  1951/01/26  Summary: Discussed diabetes and HTN. BG checks twice daily are within goal unless he eats something outside of the normal for him (mainly cereal with milk). No hypoglycemia. A1c from July 7.4%, has PCP visit next month. HTN, in office readings within goal < 140/90. Sometimes heart rate drops to 40s at home. He is unsure about accuracy of BP monitor. He will bring with him to next visit.   Recommendations: Continue low carb diet   Plan:  -PCP visit October 2022 -CCM team 3 month diabetes review -CPP visit 12 months  Subjective: [name] is an 70 y.o. year old male who is a primary patient of Ria Bush, MD.  The CCM team was consulted for assistance with disease management and care coordination needs.    Engaged with patient by telephone for follow up visit in response to provider referral for pharmacy case management and/or care coordination services.   Consent to Services:  The patient was given information about Chronic Care Management services, agreed to services, and gave verbal consent prior to initiation of services.  Please see initial visit note for detailed documentation.   Patient Care Team: Ria Bush, MD as PCP - General (Family Medicine) Josue Hector, MD as PCP - Cardiology (Cardiology) Tonia Ghent, MD (Family Medicine) Shirley Muscat Loreen Freud, MD as Referring Physician (Optometry) Netta Cedars, MD as Consulting Physician (Orthopedic Surgery) Melina Schools, MD as Consulting Physician (Orthopedic Surgery) Ardis Hughs, MD as Consulting Physician (Urology) Tyler Pita, MD as Consulting Physician (Radiation Oncology) Cira Rue, RN Nurse Navigator as Registered Nurse ([place]) [name], [facility] as Pharmacist (Pharmacist)   Recent office visits:  11/02/20 - PCP - Patient presented for AWV. Will see about  assistance for Jardiance. Follow up 3 months.    Recent consult visits:  10/12/20 - Podiatry - Patient presented for toenail trimming. Continue applying ammonium lactate lotion twice a day.   Hospital visits:  12[facility]10/20 - [facility] ED - Patient presented with rectal bleeding. CMP,CBC,LIPASE ordered, prescribed Oxycodone 5/325 2 tablets. No admission. 11/21/20 - [facility] ED - Patient presented with a cough. Labs ordered, IV fluids, Oxycodone 5/325 2 tablets given. No admission.  Objective:  Lab Results  Component Value Date   CREATININE 0.82 12/10/2020   BUN 10 12/10/2020   GFR 80.99 10/26/2020   GFRNONAA >60 12/10/2020   GFRAA >60 12/13/2019   NA 137 12/10/2020   K 3.8 12/10/2020   CALCIUM 9.0 12/10/2020   CO2 26 12/10/2020   GLUCOSE 102 (H) 12/10/2020    Lab Results  Component Value Date/Time   HGBA1C 7.4 (H) 10/26/2020 10:27 AM   HGBA1C 7.5 (A) 05/13/2020 12:07 PM   HGBA1C 7.4 (A) 03/09/2020 02:48 PM   HGBA1C 7.3 (H) 10/30/2019 07:53 AM   FRUCTOSAMINE 242 10/16/2018 11:25 AM   GFR 80.99 10/26/2020 10:27 AM   GFR 97.28 10/30/2019 07:53 AM   MICROALBUR 3.2 (H) 06/16/2012 09:23 AM   MICROALBUR 0.1 10/24/2010 08:01 AM    Last diabetic Eye exam:  Lab Results  Component Value Date/Time   HMDIABEYEEXA No Retinopathy 06/03/2018 12:00 AM    Last diabetic Foot exam: 07/29/20 podiatry  Lab Results  Component Value Date   CHOL 106 10/26/2020   HDL 34.20 (L) 10/26/2020   LDLCALC 59 10/26/2020   LDLDIRECT 104 (H) 12/26/2012   TRIG 62.0 10/26/2020   CHOLHDL  3 10/26/2020    Hepatic Function Latest Ref Rng & Units 12/10/2020 10/26/2020 03/07/2020  Total Protein 6.5 - 8.1 g/dL 7.9 7.0 7.5  Albumin 3.5 - 5.0 g/dL 4.4 4.2 4.0  AST 15 - 41 U/L 13(L) 11 18  ALT 0 - 44 U/L _0 Alk Phosphatase 38 - 126 U/L 48 47 47  Total Bilirubin 0.3 - 1.2 mg/dL 0.5 0.3 0.5  Bilirubin, Direct 0.0 - 0.3 mg/dL - - -    Lab Results  Component Value Date/Time   TSH 1.60  10/30/2019 07:53 AM   TSH 1.502 12/26/2012 02:43 PM    CBC Latest Ref Rng & Units 12/10/2020 11/21/2020 10/26/2020  WBC 4.0 - 10.5 K/uL 5.4 6.3 4.4  Hemoglobin 13.0 - 17.0 g/dL 14.2 14.0 13.2  Hematocrit 39.0 - 52.0 % 42.9 41.9 39.0  Platelets 150 - 400 K/uL 263 276 237.0    No results found for: VD25OH  Clinical ASCVD: Yes  The ASCVD Risk score (Arnett DK, et al., 2019) failed to calculate for the following reasons:   The valid total cholesterol range is 130 to 320 mg/dL    Depression screen [facility] 2/9 11/02/2020 10/29/2019 10/16/2018  Decreased Interest 0 0 0  Down, Depressed, Hopeless 0 0 0  PHQ - 2 Score 0 0 0  Altered sleeping 0 0 0  Tired, decreased energy 0 0 0  Change in appetite 0 0 0  Feeling bad or failure about yourself  0 0 0  Trouble concentrating 0 0 0  Moving slowly or fidgety/restless 0 0 0  Suicidal thoughts 0 0 0  PHQ-9 Score 0 0 0  Difficult doing work/chores Not difficult at all Not difficult at all Not difficult at all  Some recent data might be hidden    Social History   Tobacco Use  Smoking Status Never  Smokeless Tobacco Never   BP Readings from Last 3 Encounters:  12/10/20 138/76  11/21/20 137/74  11/02/20 124/76   Pulse Readings from Last 3 Encounters:  12/10/20 75  11/21/20 (!) 50  11/02/20 62   Wt Readings from Last 3 Encounters:  11/21/20 231 lb (104.8 kg)  11/02/20 232 lb (105.2 kg)  07/27/20 242 lb 1 oz (109.8 kg)   BMI Readings from Last 3 Encounters:  11/21/20 35.38 kg/m  11/02/20 35.54 kg/m  07/27/20 37.08 kg/m    Assessment/Interventions: Review of patient past medical history, allergies, medications, health status, including review of consultants reports, laboratory and other test data, was performed as part of comprehensive evaluation and provision of chronic care management services.   SDOH:  (Social Determinants of Health) assessments and interventions performed: Yes  SDOH Screenings   Alcohol Screen: Not on file   Depression (PHQ2-9): Low Risk    PHQ-2 Score: 0  Financial Resource Strain: Low Risk    Difficulty of Paying Living Expenses: Not very hard  Food Insecurity: Not on file  Housing: Not on file  Physical Activity: Not on file  Social Connections: Not on file  Stress: Not on file  Tobacco Use: Low Risk    Smoking Tobacco Use: Never   Smokeless Tobacco Use: Never  Transportation Needs: Not on file    [place]  Allergies  Allergen Reactions   Glimepiride Other (See Comments)    Headache, back pain   Menthol (Topical Analgesic) Rash    Burning rash    Medications Reviewed Today     Reviewed by [name], [facility] (Pharmacist) on 01/26/21  at 2104  Med List Status: <None>   Medication Order Taking? Sig Documenting Provider Last Dose Status Informant  ammonium lactate (AMLACTIN) 12 % cream [id number]  Apply topically in the morning and at bedtime. [provider]  Active Self  aspirin EC 81 MG tablet [id number]  Take 81 mg by mouth daily. Thurnell Lose, MD  Active Self  atorvastatin (LIPITOR) 80 MG tablet [id number]  TAKE 1 TABLET BY MOUTH  DAILY Ria Bush, MD  Active   baclofen (LIORESAL) 10 MG tablet [id number]  TAKE 1 TABLET BY MOUTH AT  BEDTIME AS NEEDED FOR  MUSCLE SPASM Ria Bush, MD  Active   carvedilol (COREG) 3.125 MG tablet [id number]  TAKE 1 TABLET BY MOUTH  TWICE DAILY WITH A MEAL Ria Bush, MD  Active   fluticasone [facility]) 50 MCG/ACT nasal spray [id number]  USE 2 SPRAYS IN BOTH  NOSTRILS DAILY Ria Bush, MD  Active Self  gabapentin (NEURONTIN) 100 MG capsule [id number]  TAKE 1 CAPSULE BY MOUTH  TWICE DAILY Ria Bush, MD  Active   glipiZIDE (GLUCOTROL XL) 5 MG 24 hr tablet [id number]  TAKE 1 TABLET BY MOUTH  DAILY WITH Sander Radon, MD  Active   guaiFENesin (MUCINEX) 600 MG 12 hr tablet [id number]  Take 600 mg by mouth 2 (two) times daily as needed for cough. [provider]  Active Self  JARDIANCE  10 MG TABS tablet [id number]  TAKE 1 TABLET BY MOUTH  DAILY Ria Bush, MD  Active   losartan (COZAAR) 50 MG tablet [id number]  TAKE 1 TABLET BY MOUTH  DAILY Ria Bush, MD  Active   metFORMIN (GLUCOPHAGE) 1000 MG tablet [id number]  Take 1 tablet (1,000 mg total) by mouth 2 (two) times daily with a meal. Ria Bush, MD  Active   omeprazole (PRILOSEC) 40 MG capsule [id number]  TAKE 1 CAPSULE BY MOUTH  DAILY Ria Bush, MD  Active   [facility] ULTRA test strip [id number]  USE WITH METER TO CHECK  BLOOD SUGAR TWICE DAILY Ria Bush, MD  Active   oxyCODONE-acetaminophen (PERCOCET) 10-325 MG tablet [id number]  Take 1 tablet by mouth every 4 (four) hours as needed for pain.  Patient taking differently: Take 1 tablet by mouth every 4 (four) hours.   Ardis Hughs, MD  Active   polyvinyl alcohol (LIQUIFILM TEARS) 1.4 % ophthalmic solution [id number]  Place 1 drop into both eyes as needed for dry eyes. [provider]  Active Self  triamcinolone (KENALOG) 0.1 % cream [id number]   Wallene Huh, DPM  Active             Patient Active Problem List   Diagnosis Date Noted   Osteoarthritis 07/27/2020   Acquired dilation of ascending aorta and aortic root (HCC)    NSAID-induced gastric ulcer 01/07/2019   Pain due to onychomycosis of toenails of both feet 10/15/2018   CAD (coronary artery disease) 09/03/2018   Abnormal stress test    Tongue lesion 08/05/2018   Fatty liver 07/31/2018   Lightheadedness 08/06/2017   Erectile dysfunction 08/01/2015   Health maintenance examination 01/31/2015   Gross hematuria    Lipoma of neck 10/01/2014   Allergic rhinitis 10/01/2014   Diabetic neuropathy ([place]) 05/31/2014   Advanced care planning/counseling discussion 01/26/2014   OSA (obstructive sleep apnea)    Tinea cruris 10/08/2012   Malignant neoplasm of prostate ([place]) 06/16/2012   Severe obesity (BMI 35.0-39.9) with comorbidity ([place]) 01/08/2012   Esophageal  dysphagia 10/10/2011   Diverticulosis  10/09/2011   Male sexual dysfunction 09/11/2011   Insomnia 06/19/2011   Shoulder pain 04/08/2011   Neck pain, chronic 02/24/2011   Medicare annual wellness visit, subsequent 10/26/2010   Iron deficiency anemia 07/01/2009   GERD 09/29/2008   Essential hypertension 01/01/2008   ANXIETY DEPRESSION 03/19/2007   TIA (transient ischemic attack) 03/05/2007   Back pain with radiculopathy 12/31/2006   ABDOMINAL WALL HERNIA 10/03/2006   Diabetes mellitus type 2, uncontrolled, with complications 94/49/6759   History of cocaine use 10/01/2006   Hyperlipidemia associated with type 2 diabetes mellitus ([place]) 08/21/2005    Immunization History  Administered Date(s) Administered   Fluad Quad(high Dose 65+) 02/03/2019, 03/09/2020   Influenza Split 02/06/2011, 01/08/2012   Influenza Whole 03/19/2007, 02/19/2008, 01/27/2009, 01/25/2010   Influenza, High Dose Seasonal PF 03/05/2018   Influenza,inj,Quad PF,6+ Mos 02/20/2013, 01/26/2014, 01/31/2015, 02/02/2016, 03/04/2017   Influenza,inj,quad, With Preservative 01/22/2019   PFIZER(Purple Top)SARS-COV-2 Vaccination 08/22/2019, 09/12/2019   Pneumococcal Conjugate-13 08/01/2015   Pneumococcal Polysaccharide-23 06/16/2012, 04/30/2019   Td 04/24/2003   Tdap 01/26/2014   Zoster, Live 12/15/2010    Conditions to be addressed/monitored:  Hypertension, Hyperlipidemia and Diabetes  Care Plan : [place]  Updates made by [name], [place] since 01/26/2021 12:00 AM     Problem: Hypertension, Hyperlipidemia, Diabetes and Coronary Artery Disease   Priority: High  Onset Date: 06/07/2020  Note:   Current Barriers:  None identified  Pharmacist Clinical Goal(s):  Patient will achieve control of diabetes as evidenced by home BG within goal fasting 80-130 and post-prandial less than 180 through collaboration with PharmD and provider.   Interventions: 1:1 collaboration with Ria Bush, MD regarding  development and update of comprehensive plan of care as evidenced by provider attestation and co-signature Inter-disciplinary care team collaboration (see longitudinal plan of care) Comprehensive medication review performed; medication list updated in electronic medical record  Hypertension (BP goal <140/90) -Controlled - per home BP reading average -Current treatment: Carvedilol 3.173m - 1 twice daily Losartan 596m- 1 daily -Medications previously tried: none  -Current home readings: using digital monitor with arm cuff BP - 126/70, 65 (11/22/20) 11/23/20 - 145/70, 43  11/24/20 - 127/77, 72 12/07/20 - 144/61, 42 12/30/20 - 149/78, 79 12/30/20 - 118/77, 80 He feels dizziness and nausea when his HR is low. He is concerned about accuracy of his BP monitor. Reminded him to bring this to next office visit. -Current dietary habits:  Not following a particular diet but tries to limit salt  -Current exercise habits: None formal, tries to get out and walk at the store, limits sedentary time  -Denies hypotensive/hypertensive symptoms -Counseled to continue monitoring BP at home weekly, document, and provide log at future appointments. -Recommended to continue current medication; Bring home BP monitor to next office visit.  Diabetes (A1c goal <7.5%) -Controlled - A1c 7.4% (improved) 10/26/20, no hypoglycemia this month -Current medications: Metformin 1000 mg - 1 tablet twice daily (breakfast and afternoon) Jardiance 10 mg - 1 tablet before breakfast  Glipizide 5 mg XL - 1 tablet daily with breakfast -Takes all three medications 30 minutes before breakfast. -Medications previously tried: previously on higher dose glipizide, decrease due to hypoglycemia -Current home glucose readings: checks up to twice daily various times of day 9/15 - Morning 104, After meal 148, Pre-meal 106, Afternoon 88 9/16 - Morning 136, After meal 265 (cereal with milk), evening 126 9/17 - Morning 215, After meal 124 9/18 -  Morning 134, After meal 145, Before lunch 106 9/19 -  Morning 109, After meal 211, Before lunch 86 -Denies hypoglycemia  -Current meal patterns: tries to watch portions/tries to eat balanced plate with baked meats, vegetables - green beans, turnip greens, onion, bell pepper. Limits potatoes/rice.  drinks: diet tea or diet soda, no juices -Current exercise: none formal -Recommended to continue current medication  Patient Goals/Self-Care Activities Patient will:  - check glucose daily, document, and provide at future appointments - check blood pressure twice weekly , document, and provide at future appointments  Follow Up Plan: Telephone follow up appointment with care management team member scheduled for:  - 12 months CCM visit - 3 month diabetes review    Medication Assistance: None required.  Patient affirms current coverage meets needs.  Star Rating Drugs:  Medication:                Last Fill:         Day Supply Metformin 1061m      08/08/20            90 (refill 11/10/20 [facility]) 90DS Jardiance 196m         PAP                 Patient has to call for refills Glipizide 35m79mL         08/16/20            90(refill  11/10/20 [facility])90DS Atorvastatin 60m24m    08/16/20            90(refill 11/10/20 [facility])90DS Losartan 50mg59m        08/16/20            90(refill 11/10/20 [facility])90DS   Upcoming appts: PCP appointment on 02/11/21  Patient's preferred pharmacy is:  [address] [place] [id number]: [phone]  ([facility] [address]e: [phone] [place] [place] [place]: [phone] ([facility]) - [place] [place] [address] [id number]: [phone] order for maintenance meds  Care Plan and Follow Up Patient Decision:  Patient agrees to Care Plan and Follow-up.  MicheDebbora DusrmD Clinical Pharmacist [facility] at [phone]

## 2021-01-09 NOTE — Telephone Encounter (Signed)
During CCM visit, Patient brought up concern about memory impairment. He plans to discuss this at currently scheduled visit in October with PCP. Will a separate visit will be needed?  [name], PharmD Clinical Pharmacist [facility] at [facility] [phone]

## 2021-01-12 NOTE — Telephone Encounter (Signed)
No - we can start with discussion at CPE thank you.

## 2021-01-16 ENCOUNTER — Other Ambulatory Visit: Payer: Self-pay | Admitting: Family Medicine

## 2021-01-16 NOTE — Telephone Encounter (Signed)
Gabapentin Last filled: 11/15/20, #180 Last OV:  11/02/20, AWV Next OV:  02/03/21, 3 mo chronic pain f/u

## 2021-01-20 DIAGNOSIS — I10 Essential (primary) hypertension: Secondary | ICD-10-CM

## 2021-01-20 DIAGNOSIS — E118 Type 2 diabetes mellitus with unspecified complications: Secondary | ICD-10-CM | POA: Diagnosis not present

## 2021-01-20 DIAGNOSIS — R310 Gross hematuria: Secondary | ICD-10-CM | POA: Diagnosis not present

## 2021-01-26 NOTE — Patient Instructions (Signed)
Dear [name],  Below is a summary of the goals we discussed during our follow up appointment on Sept. 19, 2022. Please contact me anytime with questions or concerns.   Visit Information  Patient Care Plan: CCM Pharmacy Care Plan     Problem Identified: Hypertension, Hyperlipidemia, Diabetes and Coronary Artery Disease   Priority: High  Onset Date: 06/07/2020  Note:   Current Barriers:  None identified  Pharmacist Clinical Goal(s):  Patient will achieve control of diabetes as evidenced by home BG within goal fasting 80-130 and post-prandial less than 180 through collaboration with PharmD and provider.   Interventions: 1:1 collaboration with Ria Bush, MD regarding development and update of comprehensive plan of care as evidenced by provider attestation and co-signature Inter-disciplinary care team collaboration (see longitudinal plan of care) Comprehensive medication review performed; medication list updated in electronic medical record  Hypertension (BP goal <140/90) -Controlled - per home BP reading average -Current treatment: Carvedilol 3.125mg  - 1 twice daily Losartan 50mg  - 1 daily -Medications previously tried: none  -Current home readings: using digital monitor with arm cuff BP - 126/70, 65 (11/22/20) 11/23/20 - 145/70, 43  11/24/20 - 127/77, 72 12/07/20 - 144/61, 42 12/30/20 - 149/78, 79 12/30/20 - 118/77, 80 He feels dizziness and nausea when his HR is low. He is concerned about accuracy of his BP monitor. Reminded him to bring this to next office visit. -Current dietary habits:  Not following a particular diet but tries to limit salt  -Current exercise habits: None formal, tries to get out and walk at the store, limits sedentary time  -Denies hypotensive/hypertensive symptoms -Counseled to continue monitoring BP at home weekly, document, and provide log at future appointments. -Recommended to continue current medication; Bring home BP monitor to next office  visit.  Diabetes (A1c goal <7.5%) -Controlled - A1c 7.4% (improved) 10/26/20, no hypoglycemia this month -Current medications: Metformin 1000 mg - 1 tablet twice daily (breakfast and afternoon) Jardiance 10 mg - 1 tablet before breakfast  Glipizide 5 mg XL - 1 tablet daily with breakfast -Takes all three medications 30 minutes before breakfast. -Medications previously tried: previously on higher dose glipizide, decrease due to hypoglycemia -Current home glucose readings: checks up to twice daily various times of day 9/15 - Morning 104, After meal 148, Pre-meal 106, Afternoon 88 9/16 - Morning 136, After meal 265 (cereal with milk), evening 126 9/17 - Morning 215, After meal 124 9/18 - Morning 134, After meal 145, Before lunch 106 9/19 - Morning 109, After meal 211, Before lunch 86 -Denies hypoglycemia  -Current meal patterns: tries to watch portions/tries to eat balanced plate with baked meats, vegetables - green beans, turnip greens, onion, bell pepper. Limits potatoes/rice.  drinks: diet tea or diet soda, no juices -Current exercise: none formal -Recommended to continue current medication  Patient Goals/Self-Care Activities Patient will:  - check glucose daily, document, and provide at future appointments - check blood pressure twice weekly , document, and provide at future appointments  Follow Up Plan: Telephone follow up appointment with care management team member scheduled for:  - 12 months CCM visit - 3 month diabetes review      Patient verbalizes understanding of instructions provided today and agrees to view in [place].   [name], PharmD Clinical Pharmacist [place] [facility] at [facility] [phone]

## 2021-02-03 ENCOUNTER — Ambulatory Visit: Payer: Medicare Other | Admitting: Family Medicine

## 2021-02-06 DIAGNOSIS — H0102B Squamous blepharitis left eye, upper and lower eyelids: Secondary | ICD-10-CM | POA: Diagnosis not present

## 2021-02-06 DIAGNOSIS — H16223 Keratoconjunctivitis sicca, not specified as Sjogren's, bilateral: Secondary | ICD-10-CM | POA: Diagnosis not present

## 2021-02-06 DIAGNOSIS — H0102A Squamous blepharitis right eye, upper and lower eyelids: Secondary | ICD-10-CM | POA: Diagnosis not present

## 2021-02-06 DIAGNOSIS — E119 Type 2 diabetes mellitus without complications: Secondary | ICD-10-CM | POA: Diagnosis not present

## 2021-02-06 DIAGNOSIS — Z961 Presence of intraocular lens: Secondary | ICD-10-CM | POA: Diagnosis not present

## 2021-02-06 DIAGNOSIS — H353131 Nonexudative age-related macular degeneration, bilateral, early dry stage: Secondary | ICD-10-CM | POA: Diagnosis not present

## 2021-02-06 DIAGNOSIS — H40013 Open angle with borderline findings, low risk, bilateral: Secondary | ICD-10-CM | POA: Diagnosis not present

## 2021-02-06 DIAGNOSIS — H1045 Other chronic allergic conjunctivitis: Secondary | ICD-10-CM | POA: Diagnosis not present

## 2021-02-06 LAB — HM DIABETES EYE EXAM

## 2021-02-15 ENCOUNTER — Ambulatory Visit: Payer: Medicare Other | Admitting: Podiatry

## 2021-02-21 ENCOUNTER — Other Ambulatory Visit: Payer: Self-pay | Admitting: Family Medicine

## 2021-02-22 DIAGNOSIS — M5412 Radiculopathy, cervical region: Secondary | ICD-10-CM | POA: Diagnosis not present

## 2021-02-24 ENCOUNTER — Telehealth: Payer: Self-pay

## 2021-02-24 NOTE — Progress Notes (Addendum)
    Chronic Care Management Pharmacy Assistant   Name: [name]  MRN: [id number] DOB: Jul 31, 1950  Reason for Encounter: CCM ([place] 2023 PAP Renewal)   02/24/2021 - Patient assistance forms for Jardiance [facility]) have been completed and uploaded. Called patient to verify if he wanted forms mailed, emailed or faxed; no answer; voicemail full.  [name], CPP notified  Marijean Niemann, [place] Clinical Pharmacy Assistant [phone]

## 2021-03-02 NOTE — Telephone Encounter (Signed)
Reviewed and sent to [facility] team for printing.

## 2021-03-02 NOTE — Progress Notes (Signed)
Renew[place] forms for Jardiance mailed to the patient with instructions and envelope to mail back to [place] when complete  [name], CPP notified  Avel Sensor, [place] Assistant [phone]  Total time spent for month CPA: 10 min.

## 2021-03-06 NOTE — Telephone Encounter (Addendum)
Patient called to confirm he received forms. He will mail to office once completed.

## 2021-03-14 NOTE — Telephone Encounter (Signed)
Forms received by mail at [facility] location. Section 3: insurance information was not done. Sharyn Lull does that needs to be filled out? Does this need to be faxed over after that?

## 2021-03-14 NOTE — Telephone Encounter (Signed)
I will get it signed. Patient did attach his insurance card with the form. Placing in Dr Synthia Innocent inbox for review and to sign. Dr Darnell Level please return to me when completed. Thank you

## 2021-03-15 NOTE — Telephone Encounter (Signed)
Left message for patient to call back to go over the questions

## 2021-03-15 NOTE — Telephone Encounter (Addendum)
Signed and placed in Lisa's box.  He still needs to answer sections 2 and 3.

## 2021-03-20 DIAGNOSIS — R310 Gross hematuria: Secondary | ICD-10-CM | POA: Diagnosis not present

## 2021-03-22 NOTE — Telephone Encounter (Signed)
Spoke with patient. Filled out missing information. Faxed forms to [facility]

## 2021-03-23 NOTE — Telephone Encounter (Signed)
Amy, will you check on status of application with [employer] sometime this week?

## 2021-03-28 ENCOUNTER — Telehealth: Payer: Self-pay

## 2021-03-28 NOTE — Progress Notes (Signed)
    Chronic Care Management Pharmacy Assistant   Name: [name]  MRN: [id number] DOB: October 18, 1950  Reason for Encounter: CCM [facility] 2023 Patient Assistance - Denial)   Spoke with [employer]; Patient was denied due to being over income requirements. Patient will receive a letter in the mail with [facility] determination as well.    [name], CPP notified   Marijean Niemann, [place] Clinical Pharmacy Assistant [phone]   Time Spent:  10 Minutes

## 2021-03-28 NOTE — Progress Notes (Signed)
Spoke with [employer]; Patient was denied due to being over income requirements. Patient will receive a letter in the mail with [facility] determination as well.   [name], CPP notified  Marijean Niemann, [place] Clinical Pharmacy Assistant [phone]  Time Spent:  10 Minutes

## 2021-03-28 NOTE — Telephone Encounter (Signed)
Attempted to reach patient to discuss additional options since Jardiance was denied.

## 2021-04-11 ENCOUNTER — Telehealth: Payer: Self-pay

## 2021-04-11 NOTE — Telephone Encounter (Signed)
Spoke with pt relaying Dr. G's message. Pt verbalizes understanding.  

## 2021-04-11 NOTE — Telephone Encounter (Addendum)
Noted. Agree. Glad it's getting better. Will rec OV if worsening symptoms.

## 2021-04-11 NOTE — Telephone Encounter (Signed)
I spoke with pt;pt said he is feeling better; he has taken couple of doses of the honey and gin and that has thinned the mucus in his lungs and phlegm is not as dark as it was earlier. Pt also has started mucinex and that seems to help. Pt said he already has appt for 6 mth FU on 04/18/21 and pt wants to wait and see taking these meds if he continues to improve and if so he will wait on his 04/18/21 appt with Dr Darnell Level. Pt said right now his abd pain is better than earlier; pt said pain level now is 7 and pt has not had any diarrhea, constipation or any urinary symptoms. Pt told the pharmacist all the info in her note thinking Dr Darnell Level would send in an abx to pharmacy. I advised pt if he is having abd pain and worsening with the respiratory symptoms pt needs to have a face to face visit. Pt said he is not worsening now but pt was given UC & ED precautions and pt voiced understanding. Pt said he wants to continue the gin and honey and mucinex tonight to see if pt continues to improve and if not pt will cb. Sending note to Dr Darnell Level and Lattie Haw CMA and I will teams Lattie Haw also.

## 2021-04-11 NOTE — Telephone Encounter (Signed)
Pt spoke to CCM team earlier today - would like a return call from office.  "Pt reports sharp pain between his belly button and chest with no radiation. Patient rates pain as a 9/10. Patient states the pain has been going on for a week and is getting worse. Patient states the pain comes and goes. Patient can not pick up anything or walk without the pain increasing. States when he moves the pain gets worse. Patient would like for someone from Dr. Bosie Clos office to call him back. Other symptoms include: productive cough with brown mucus, sore throat, sinus pain/pressure, sinus drainage. Patient stated he is not having headache, shortness of breath, wheezing, fever or muscle/body aches. Treatment includes: honey and gin yesterday and Mucinex this morning. "

## 2021-04-11 NOTE — Progress Notes (Addendum)
Chronic Care Management Pharmacy Assistant   Name: [name]  MRN: [id number] DOB: 1950/09/01  Reason for Encounter: CCM (Diabetes Disease State)   Recent office visits:  None since last CCM contact  Recent consult visits:  02/22/2021 [name] - Patient presented for cervical radiculopathy. No other information.   Hospital visits:  None since last CCM contact  Medications: Outpatient Encounter Medications as of 04/11/2021  Medication Sig   ammonium lactate (AMLACTIN) 12 % cream Apply topically in the morning and at bedtime.   aspirin EC 81 MG tablet Take 81 mg by mouth daily.   atorvastatin (LIPITOR) 80 MG tablet TAKE 1 TABLET BY MOUTH  DAILY   baclofen (LIORESAL) 10 MG tablet TAKE 1 TABLET BY MOUTH AT  BEDTIME AS NEEDED FOR  MUSCLE SPASM   carvedilol (COREG) 3.125 MG tablet TAKE 1 TABLET BY MOUTH  TWICE DAILY WITH A MEAL   fluticasone (FLONASE) 50 MCG/ACT nasal spray USE 2 SPRAYS IN BOTH  NOSTRILS DAILY   gabapentin (NEURONTIN) 100 MG capsule TAKE 1 CAPSULE BY MOUTH  TWICE DAILY   glipiZIDE (GLUCOTROL XL) 5 MG 24 hr tablet TAKE 1 TABLET BY MOUTH  DAILY WITH BREAKFAST   guaiFENesin (MUCINEX) 600 MG 12 hr tablet Take 600 mg by mouth 2 (two) times daily as needed for cough.   JARDIANCE 10 MG TABS tablet TAKE 1 TABLET BY MOUTH  DAILY   losartan (COZAAR) 50 MG tablet TAKE 1 TABLET BY MOUTH  DAILY   metFORMIN (GLUCOPHAGE) 1000 MG tablet Take 1 tablet (1,000 mg total) by mouth 2 (two) times daily with a meal.   omeprazole (PRILOSEC) 40 MG capsule TAKE 1 CAPSULE BY MOUTH  DAILY   ONETOUCH ULTRA test strip USE WITH METER TO CHECK  BLOOD SUGAR TWICE DAILY   oxyCODONE-acetaminophen (PERCOCET) 10-325 MG tablet Take 1 tablet by mouth every 4 (four) hours as needed for pain. (Patient taking differently: Take 1 tablet by mouth every 4 (four) hours.)   polyvinyl alcohol (LIQUIFILM TEARS) 1.4 % ophthalmic solution Place 1 drop into both eyes as needed for dry eyes.    Facility-Administered Encounter Medications as of 04/11/2021  Medication   triamcinolone (KENALOG) 0.1 % cream     Recent Relevant Labs: Lab Results  Component Value Date/Time   HGBA1C 7.4 (H) 10/26/2020 10:27 AM   HGBA1C 7.5 (A) 05/13/2020 12:07 PM   HGBA1C 7.4 (A) 03/09/2020 02:48 PM   HGBA1C 7.3 (H) 10/30/2019 07:53 AM   MICROALBUR 3.2 (H) 06/16/2012 09:23 AM   MICROALBUR 0.1 10/24/2010 08:01 AM    Kidney Function Lab Results  Component Value Date/Time   CREATININE 0.82 12/10/2020 01:39 PM   CREATININE 0.90 11/21/2020 08:41 PM   CREATININE 0.99 12/26/2012 02:43 PM   GFR 80.99 10/26/2020 10:27 AM   GFRNONAA >60 12/10/2020 01:39 PM   GFRAA >60 12/13/2019 11:40 AM   Contacted patient on 12/20/20221 to discuss diabetes disease state.   Current antihyperglycemic regimen:  Metformin 1000 mg - 1 tablet twice daily (breakfast and afternoon) Jardiance 10 mg - 1 tablet before breakfast  Glipizide 5 mg XL - 1 tablet daily with breakfast   Patient verbally confirms he is taking the above medications as directed. No  What diet changes have been made to improve diabetes control?  Patient tries to watch portions/tries to eat balanced meals.  What recent interventions/DTPs have been made to improve glycemic control:  No recent interventions. Recommended to continue current medications.   Have there been  any recent hospitalizations or ED visits since last visit with CPP? No  Patient denies hypoglycemic symptoms, including Pale, Sweaty, Shaky, Hungry, Nervous/irritable, and Vision changes  Patient denies hyperglycemic symptoms, including blurry vision, excessive thirst, fatigue, polyuria, and weakness  How often are you checking your blood sugar?  Patient takes his blood sugar two times a day.   Date  Time  Reading Time   Reading 12/19  11:45 am 88  4:30 pm 109 12/18  5:30 am 136  10:30 am 141 12/17  10:00 am 79  4:30 pm 128 12/16  10:00 am 99  5:00 pm 146 12/15  10:30  am 105  1:30 pm 108 12/14  10:00 am 94  ---------- ----- 12/13  10:30 am 85  3:00 pm 86 12/12  4:00 am 104  ----------- ------ 12/11  6:30 am 130  10:30 am 78  During the week, how often does your blood glucose drop below 70? Never  Are you checking your feet daily/regularly? Yes  Adherence Review: Is the patient currently on a STATIN medication? Yes Is the patient currently on ACE/ARB medication? Yes Does the patient have >5 day gap between last estimated fill dates? No  Care Gaps: Annual wellness visit in last year? Yes 11/02/2020  Most recent A1C reading: 7.4 10/26/2020 Most Recent BP reading: 138/76 on 12/10/2020  Last eye exam / retinopathy screening: 05/01/2019 Last diabetic foot exam: Up to date  Star Rating Drugs:  Medication:  Last Fill: Day Supply Metformin 1000 mg 02/07/2021 90       Jardiance 10 mg          PAP                 Patient has to call for refills Glipizide 5mg  XL 02/07/2021 90        Atorvastatin 80 mg 02/07/2021 90        Losartan 50 mg 02/07/2021 90  Verified fill dates with [facility]    While speaking with the patient he told me he wasn't feeling well. Patient stated he is having sharp pain between his belly button and chest with no radiation. Patient states the pain has been going on for a week and is getting worse. Other symptoms include: productive cough with brown mucus, sore throat, sinus pain/pressure, sinus drainage. Patient stated he is not having headache, shortness of breath, wheezing, fever or muscle/body aches. Treatment includes: honey and gin yesterday and Mucinex this morning. Patient rates pain as a 9/10. Patient states the pain comes and goes. Patient can not pick up anything or walk without the pain increasing. States when he moves the pain gets worse. Patient would like for someone from Dr. Bosie Clos office to call him back.   PCP appointment on 04/18/2021  [name], CPP notified  Marijean Niemann, [place]  Assistant [phone]  I have reviewed the care management and care coordination activities outlined in this encounter and I am certifying that I agree with the content of this note. Sent telephone note to triage.   [name], PharmD Clinical Pharmacist [facility] at [facility] [phone]

## 2021-04-13 ENCOUNTER — Other Ambulatory Visit: Payer: Self-pay

## 2021-04-13 ENCOUNTER — Encounter (HOSPITAL_COMMUNITY): Payer: Self-pay | Admitting: Emergency Medicine

## 2021-04-13 ENCOUNTER — Emergency Department (HOSPITAL_COMMUNITY)
Admission: EM | Admit: 2021-04-13 | Discharge: 2021-04-13 | Disposition: A | Discharge status: Home / Self Care | Payer: Medicare Other | Attending: Emergency Medicine | Admitting: Emergency Medicine

## 2021-04-13 ENCOUNTER — Emergency Department (HOSPITAL_COMMUNITY): Payer: Medicare Other

## 2021-04-13 DIAGNOSIS — I2510 Atherosclerotic heart disease of native coronary artery without angina pectoris: Secondary | ICD-10-CM | POA: Insufficient documentation

## 2021-04-13 DIAGNOSIS — Z8546 Personal history of malignant neoplasm of prostate: Secondary | ICD-10-CM | POA: Diagnosis not present

## 2021-04-13 DIAGNOSIS — Z79899 Other long term (current) drug therapy: Secondary | ICD-10-CM | POA: Diagnosis not present

## 2021-04-13 DIAGNOSIS — J209 Acute bronchitis, unspecified: Secondary | ICD-10-CM | POA: Diagnosis not present

## 2021-04-13 DIAGNOSIS — Z7982 Long term (current) use of aspirin: Secondary | ICD-10-CM | POA: Insufficient documentation

## 2021-04-13 DIAGNOSIS — Z20822 Contact with and (suspected) exposure to covid-19: Secondary | ICD-10-CM | POA: Insufficient documentation

## 2021-04-13 DIAGNOSIS — Z7984 Long term (current) use of oral hypoglycemic drugs: Secondary | ICD-10-CM | POA: Insufficient documentation

## 2021-04-13 DIAGNOSIS — R059 Cough, unspecified: Secondary | ICD-10-CM | POA: Diagnosis not present

## 2021-04-13 DIAGNOSIS — E1140 Type 2 diabetes mellitus with diabetic neuropathy, unspecified: Secondary | ICD-10-CM | POA: Diagnosis not present

## 2021-04-13 DIAGNOSIS — R079 Chest pain, unspecified: Secondary | ICD-10-CM | POA: Diagnosis not present

## 2021-04-13 DIAGNOSIS — I10 Essential (primary) hypertension: Secondary | ICD-10-CM | POA: Diagnosis not present

## 2021-04-13 LAB — RESP PANEL BY RT-PCR (FLU A&B, COVID) ARPGX2
Influenza A by PCR: NEGATIVE
Influenza B by PCR: NEGATIVE
SARS Coronavirus 2 by RT PCR: NEGATIVE

## 2021-04-13 LAB — CBC WITH DIFFERENTIAL/PLATELET
Abs Immature Granulocytes: 0.02 10*3/uL (ref 0.00–0.07)
Basophils Absolute: 0 10*3/uL (ref 0.0–0.1)
Basophils Relative: 0 %
Eosinophils Absolute: 0.1 10*3/uL (ref 0.0–0.5)
Eosinophils Relative: 2 %
HCT: 39.2 % (ref 39.0–52.0)
Hemoglobin: 13.6 g/dL (ref 13.0–17.0)
Immature Granulocytes: 0 %
Lymphocytes Relative: 15 %
Lymphs Abs: 1 10*3/uL (ref 0.7–4.0)
MCH: 31.1 pg (ref 26.0–34.0)
MCHC: 34.7 g/dL (ref 30.0–36.0)
MCV: 89.5 fL (ref 80.0–100.0)
Monocytes Absolute: 0.4 10*3/uL (ref 0.1–1.0)
Monocytes Relative: 6 %
Neutro Abs: 5.2 10*3/uL (ref 1.7–7.7)
Neutrophils Relative %: 77 %
Platelets: 259 10*3/uL (ref 150–400)
RBC: 4.38 MIL/uL (ref 4.22–5.81)
RDW: 15.1 % (ref 11.5–15.5)
WBC: 6.8 10*3/uL (ref 4.0–10.5)
nRBC: 0 % (ref 0.0–0.2)

## 2021-04-13 LAB — COMPREHENSIVE METABOLIC PANEL
ALT: 19 U/L (ref 0–44)
AST: 15 U/L (ref 15–41)
Albumin: 3.8 g/dL (ref 3.5–5.0)
Alkaline Phosphatase: 79 U/L (ref 38–126)
Anion gap: 9 (ref 5–15)
BUN: 16 mg/dL (ref 8–23)
CO2: 23 mmol/L (ref 22–32)
Calcium: 8.4 mg/dL — ABNORMAL LOW (ref 8.9–10.3)
Chloride: 100 mmol/L (ref 98–111)
Creatinine, Ser: 1.09 mg/dL (ref 0.61–1.24)
GFR, Estimated: >60 mL/min (ref >60)
Glucose, Bld: 259 mg/dL — ABNORMAL HIGH (ref 70–99)
Potassium: 3.1 mmol/L — ABNORMAL LOW (ref 3.5–5.1)
Sodium: 132 mmol/L — ABNORMAL LOW (ref 135–145)
Total Bilirubin: 0.4 mg/dL (ref 0.3–1.2)
Total Protein: 7.4 g/dL (ref 6.5–8.1)

## 2021-04-13 LAB — URINALYSIS, ROUTINE W REFLEX MICROSCOPIC
Bacteria, UA: NONE SEEN
Bilirubin Urine: NEGATIVE
Glucose, UA: >=500 mg/dL — AB
Hgb urine dipstick: NEGATIVE
Ketones, ur: 5 mg/dL — AB
Leukocytes,Ua: NEGATIVE
Nitrite: NEGATIVE
Protein, ur: NEGATIVE mg/dL
Specific Gravity, Urine: 1.023 (ref 1.005–1.030)
pH: 6 (ref 5.0–8.0)

## 2021-04-13 LAB — LIPASE, BLOOD: Lipase: 37 U/L (ref 11–51)

## 2021-04-13 MED ORDER — BENZONATATE 100 MG PO CAPS: 100.0000 mg | ORAL_CAPSULE | Freq: Three times a day (TID) | ORAL | 0 refills | Status: DC

## 2021-04-13 MED ORDER — DOXYCYCLINE HYCLATE 100 MG PO CAPS: 100.0000 mg | ORAL_CAPSULE | Freq: Two times a day (BID) | ORAL | 0 refills | Status: DC

## 2021-04-13 NOTE — ED Triage Notes (Signed)
Patient complaining of upper abdominal pain, cough, and mucus x 3 weeks.

## 2021-04-13 NOTE — ED Provider Notes (Signed)
[facility] EMERGENCY DEPARTMENT Provider Note  CSN: [id number] Arrival date & time: 04/13/21 0357    History Chief Complaint  Patient presents with   Cough    [name] is a 70 y.o. male reports 3 weeks of cough, productive of thick yellow mucous, sinus drainage. He has had some mild upper abdominal soreness. He has NOT had vomiting as noted in triage notes, but after he eats he has to 'hock up' more mucous with his cough. He has not had a fever or chest pains. No SOB. He has been using home remedies (honey and gin) and OTC Mucinex with some improvement. Scheduled to see PCP next week but was concerned symptoms were worsening. He reports the upper abdominal soreness is from coughing.    Past Medical History:  Diagnosis Date   Acquired dilation of ascending aorta and aortic root ([place])    2mm ascending aorta   Allergic rhinitis    Anxiety    Atherosclerosis of aorta (HCC)    CAD (coronary artery disease) cardiologist-- dr Tressia Miners turner   long hx atypical chest pain--- cardiac cath 08-24-2002 in epic showed normal coronaries;   03-17-2007  nuclear stress test showed ?small infarct , ef 60%;  cath done 12-31-2007 for recurrent CP showed nonob cad;   08-27-2018 stress test showed intermediate risk w/ apex ischemia, nuclear ef 61%;   cath done 08-29-2018 showed Nonobstructive 25% plaque in LAD    Chronic neck pain    narcotic dependant   DDD (degenerative disc disease), cervical    Depression    Diabetic neuropathy ([place])    Diverticulosis 2013   by CT scan   Erectile dysfunction 08/01/2015   Essential hypertension 01/01/2008   Fatty liver 07/31/2018   By [place] 07/2018,  followed by pcp   GERD (gastroesophageal reflux disease)    Hemorrhoids    History of anemia    History of cocaine abuse ([place])    per pt quit 05/ 2004   History of cocaine use 2004   History of colon polyps    History of epididymitis    History of esophageal stricture    s/p  dilatation 2009;  12-30-2018    History of gastric ulcer    12-30-2018  EGD showed non-bleeding gastric ulcer   History of prostatitis 08/2011   History of transient ischemic attack (TIA)    remote hx , related to cocaine abuse which pt quit 2005   Hyperlipidemia    Hypertension    Essential   OSA (obstructive sleep apnea)    study 05-07-2008 in epic , mild ,  pt does not use cpap   Pain due to onychomycosis of toenails of both feet 10/15/2018   Prostate cancer [facility]) urologist--- dr herrick/  oncologist--- dr Tammi Klippel   first dx 01/ 2018 w/ Stage T2a and Gleason 3+3 , active survillance;  repear bx 04-21-2019 Gleason 3+4,  scheduled for brachytherapy 07-03-2019   T2DM (type 2 diabetes mellitus) ([place])    followed by pcp---  (03-04-201  checks blood sugar 3 times daily,  fasting sugar's-- 140-150    Past Surgical History:  Procedure Laterality Date   ANTERIOR CERVICAL DECOMP/DISCECTOMY FUSION  06-24-2008  and 10-28-2008 @[facility]    06-25-2008  C7 -- T1;    10-28-2008  C5 -- C7   CARDIAC CATHETERIZATION  08-24-2002   dr gamble  @[facility]    normal coronaries   CARDIAC CATHETERIZATION  12-31-2007    dr Johnsie Cancel   nonobstructive CAD  CARPAL TUNNEL RELEASE Right 2000  approx.   CATARACT EXTRACTION W/ INTRAOCULAR LENS  IMPLANT, BILATERAL  2016   COLONOSCOPY  10/11/2011   severe diverticulosis, int hemorrhoids, rec rpt 5 yrs Carlean Purl)   COLONOSCOPY  12/2018   benign rectal polyp Carlean Purl)   DIRECT LARYNGOSCOPY  11/02/2008   @[facility]    Extubation under anesthesia post op complication from ACDF   ESOPHAGOGASTRODUODENOSCOPY  10/11/2011   WNL Carlean Purl)   ESOPHAGOGASTRODUODENOSCOPY  09/11-2018   gastric ulcer, gastritis, dilated esophageal stenosis, limit NSAIDs continue PPI, no f/u planned Carlean Purl)   EXCISION MASS NECK N/A 08/29/2016   Procedure: EXCISION MASS POSTERIOR NECK;  Surgeon: Erroll Luna, MD;  Location: [facility];  Service: General;  Laterality: N/A;   HEMATOMA EVACUATION  10/29/2008   @[facility]    post op ACDF  10-28-2008   LAPAROSCOPIC INGUINAL HERNIA REPAIR Bilateral 11-22-2006   @[facility]    AND EXCISION RLQ ABDOMINAL LIPOMA   LEFT HEART CATH AND CORONARY ANGIOGRAPHY N/A 08/29/2018   Procedure: LEFT HEART CATH AND CORONARY ANGIOGRAPHY;  Surgeon: Jettie Booze, MD;  Location: [place] CV LAB;  Service: Cardiovascular;  Laterality: N/A;   RADIOACTIVE SEED IMPLANT N/A 07/03/2019   Procedure: RADIOACTIVE SEED IMPLANT/BRACHYTHERAPY IMPLANT;  Surgeon: Ardis Hughs, MD;  Location: [facility];  Service: Urology;  Laterality: N/A;   SHOULDER ARTHROSCOPY WITH SUBACROMIAL DECOMPRESSION AND OPEN ROTATOR C Left 01/23/2013   Procedure: LEFT SHOULDER ARTHROSCOPY WITH SUBACROMIAL DECOMPRESSION AND MINI OPEN ROTATOR CUFF REPAIR, AND OPEN DISTAL CLAVICLE RESECTION;  Surgeon: Augustin Schooling, MD;  Location: [facility];  Service: Orthopedics;  Laterality: Left;   SHOULDER SURGERY Right 05/2011   SPACE OAR INSTILLATION N/A 07/03/2019   Procedure: SPACE OAR INSTILLATION;  Surgeon: Ardis Hughs, MD;  Location: [facility];  Service: Urology;  Laterality: N/A;   TONSILLECTOMY  child   TRIGGER FINGER RELEASE Right 12-12-2005   @[facility]    Right long finger    Family History  Problem Relation Age of Onset   Diabetes Mother    Hypertension Mother    Hyperlipidemia Mother    Diabetes Father    Diabetes Sister    Hypertension Sister    Hypertension Brother    Diabetes Brother    Diabetes Brother    Hypertension Brother    Diabetes Brother    Heart disease Maternal Uncle        Heart failure   Cancer Paternal Uncle        Prostate   Cancer Paternal Grandfather        Prostate   Stroke Neg Hx    Esophageal cancer Neg Hx    Rectal cancer Neg Hx    Stomach cancer Neg Hx    Breast cancer Neg Hx    Colon cancer Neg Hx     Social History   Tobacco Use   Smoking status: Never   Smokeless tobacco: Never  Vaping Use   Vaping Use: Never used  Substance Use Topics   Alcohol  use: No    Alcohol/week: 0.0 standard drinks   Drug use: Not Currently    Comment: H/O marijuana (last 2005); Cocaine 5/04     Home Medications Prior to Admission medications   Medication Sig Start Date End Date Taking? Authorizing Provider  benzonatate (TESSALON) 100 MG capsule Take 1 capsule (100 mg total) by mouth every 8 (eight) hours. 04/13/21  Yes Truddie Hidden, MD  doxycycline (VIBRAMYCIN) 100 MG capsule Take 1 capsule (100  mg total) by mouth 2 (two) times daily. 04/13/21  Yes Truddie Hidden, MD  ammonium lactate (AMLACTIN) 12 % cream Apply topically in the morning and at bedtime.    [provider]  aspirin EC 81 MG tablet Take 81 mg by mouth daily. 10/25/11   Thurnell Lose, MD  atorvastatin (LIPITOR) 80 MG tablet TAKE 1 TABLET BY MOUTH  DAILY 01/16/21   Ria Bush, MD  baclofen (LIORESAL) 10 MG tablet TAKE 1 TABLET BY MOUTH AT  BEDTIME AS NEEDED FOR  MUSCLE SPASM 12/19/20   Ria Bush, MD  carvedilol (COREG) 3.125 MG tablet TAKE 1 TABLET BY MOUTH  TWICE DAILY WITH A MEAL 01/16/21   Ria Bush, MD  fluticasone [facility]) 50 MCG/ACT nasal spray USE 2 SPRAYS IN BOTH  NOSTRILS DAILY 02/21/21   Ria Bush, MD  gabapentin (NEURONTIN) 100 MG capsule TAKE 1 CAPSULE BY MOUTH  TWICE DAILY 01/16/21   Ria Bush, MD  glipiZIDE (GLUCOTROL XL) 5 MG 24 hr tablet TAKE 1 TABLET BY MOUTH  DAILY WITH BREAKFAST 01/16/21   Ria Bush, MD  guaiFENesin (MUCINEX) 600 MG 12 hr tablet Take 600 mg by mouth 2 (two) times daily as needed for cough.    [provider]  JARDIANCE 10 MG TABS tablet TAKE 1 TABLET BY MOUTH  DAILY 08/02/20   Ria Bush, MD  losartan (COZAAR) 50 MG tablet TAKE 1 TABLET BY MOUTH  DAILY 01/16/21   Ria Bush, MD  metFORMIN (GLUCOPHAGE) 1000 MG tablet Take 1 tablet (1,000 mg total) by mouth 2 (two) times daily with a meal. 07/15/20   Ria Bush, MD  omeprazole (PRILOSEC) 40 MG capsule TAKE 1 CAPSULE BY MOUTH   DAILY 01/16/21   Ria Bush, MD  [facility] ULTRA test strip USE WITH METER TO CHECK  BLOOD SUGAR TWICE DAILY 10/13/20   Ria Bush, MD  oxyCODONE-acetaminophen (PERCOCET) 10-325 MG tablet Take 1 tablet by mouth every 4 (four) hours as needed for pain. Patient taking differently: Take 1 tablet by mouth every 4 (four) hours. 07/03/19   Ardis Hughs, MD  polyvinyl alcohol (LIQUIFILM TEARS) 1.4 % ophthalmic solution Place 1 drop into both eyes as needed for dry eyes.    [provider]     Allergies    Glimepiride and Menthol (topical analgesic)   Review of Systems   Review of Systems A comprehensive review of systems was completed and negative except as noted in HPI.    Physical Exam BP (!) 140/91    Pulse 69    Temp 98.2 F (36.8 C) (Oral)    Resp 17    Ht 5\' 8"  (1.727 m)    Wt 108.9 kg    SpO2 100%    BMI 36.49 kg/m   Physical Exam Vitals and nursing note reviewed.  Constitutional:      Appearance: Normal appearance.  HENT:     Head: Normocephalic and atraumatic.     Nose: Nose normal.     Mouth/Throat:     Mouth: Mucous membranes are moist.  Eyes:     Extraocular Movements: Extraocular movements intact.     Conjunctiva/sclera: Conjunctivae normal.  Cardiovascular:     Rate and Rhythm: Normal rate.  Pulmonary:     Effort: Pulmonary effort is normal.     Breath sounds: Normal breath sounds. No wheezing or rhonchi.     Comments: Coarse cough with yellow sputum production Abdominal:     General: Abdomen is flat.  Palpations: Abdomen is soft.     Tenderness: There is no abdominal tenderness. There is no guarding.  Musculoskeletal:        General: No swelling. Normal range of motion.     Cervical back: Neck supple.  Skin:    General: Skin is warm and dry.  Neurological:     General: No focal deficit present.     Mental Status: He is alert.  Psychiatric:        Mood and Affect: Mood normal.     ED Results / Procedures / Treatments    Labs (all labs ordered are listed, but only abnormal results are displayed) Labs Reviewed  COMPREHENSIVE METABOLIC PANEL - Abnormal; Notable for the following components:      Result Value   Sodium 132 (*)    Potassium 3.1 (*)    Glucose, Bld 259 (*)    Calcium 8.4 (*)    All other components within normal limits  URINALYSIS, ROUTINE W REFLEX MICROSCOPIC - Abnormal; Notable for the following components:   Color, Urine STRAW (*)    Glucose, UA >=500 (*)    Ketones, ur 5 (*)    All other components within normal limits  RESP PANEL BY RT-PCR (FLU A&B, COVID) ARPGX2  CBC WITH DIFFERENTIAL/PLATELET  LIPASE, BLOOD    EKG None  Radiology DG Chest 2 View  Result Date: 04/13/2021 CLINICAL DATA:  Cough and chest pain. EXAM: CHEST - 2 VIEW COMPARISON:  11/21/2020. FINDINGS: The heart size and mediastinal contours are within normal limits. Both lungs are clear. Cervical spinal fusion hardware is noted. Degenerative changes are present in the thoracic spine. No acute osseous abnormality. IMPRESSION: No active cardiopulmonary disease. Electronically Signed   By: Brett Fairy M.D.   On: 04/13/2021 04:37    Procedures Procedures  Medications Ordered in the ED Medications - No data to display   MDM Rules/Calculators/A&P MDM Patient here with 3 weeks of persistent cough, sinus drainage and yellow sputum. Not really having any vomiting. He has some upper abdominal soreness which he attributes to the coughing. Labs and CXR in triage reviewed, CBC is normal, CMP without signs of hepatobiliary disease or pancreatitis. Abdomen is benign. His CXR is clear. He likely has a bronchitis, given duration of symptoms, will give a course of doxycycline pending his PCP visit scheduled for next week. Patient is amenable to this plan.   ED Course  I have reviewed the triage vital signs and the nursing notes.  Pertinent labs & imaging results that were available during my care of the patient were reviewed  by me and considered in my medical decision making (see chart for details).     Final Clinical Impression(s) / ED Diagnoses Final diagnoses:  Acute bronchitis, unspecified organism    Rx / DC Orders ED Discharge Orders          Ordered    benzonatate (TESSALON) 100 MG capsule  Every 8 hours        04/13/21 0755    doxycycline (VIBRAMYCIN) 100 MG capsule  2 times daily        04/13/21 0755             Truddie Hidden, MD 04/13/21 [phone]

## 2021-04-13 NOTE — ED Provider Notes (Signed)
Emergency Medicine Provider Triage Evaluation Note  [name] , a 71 y.o. male  was evaluated in triage.  Pt complains of vomit.  Review of Systems  Positive: Congest, cough, throat irritation, nausea, vomit Negative: Fever, sob, abd pain  Physical Exam  There were no vitals taken for this visit. Gen:   Awake, no distress   Resp:  Normal effort  MSK:   Moves extremities without difficulty  Other:    Medical Decision Making  Medically screening exam initiated at 4:07 AM.  Appropriate orders placed.  Lawerance Bach Foister was informed that the remainder of the evaluation will be completed by another provider, this initial triage assessment does not replace that evaluation, and the importance of remaining in the ED until their evaluation is complete.  Pt here with cold sxs x 2 weeks, but for the past few days he also endorse vomiting up after eating.  Neg home covid test.    Domenic Moras, PA-C 04/13/21 0408    Malvin Johns, MD 04/13/21 [phone]

## 2021-04-18 ENCOUNTER — Other Ambulatory Visit: Payer: Self-pay

## 2021-04-18 ENCOUNTER — Ambulatory Visit (INDEPENDENT_AMBULATORY_CARE_PROVIDER_SITE_OTHER): Payer: Medicare Other | Admitting: Family Medicine

## 2021-04-18 ENCOUNTER — Encounter: Payer: Self-pay | Admitting: Family Medicine

## 2021-04-18 VITALS — BP 136/72 | HR 78 | Temp 98.0°F | Ht 68.0 in | Wt 228.2 lb

## 2021-04-18 DIAGNOSIS — J209 Acute bronchitis, unspecified: Secondary | ICD-10-CM | POA: Diagnosis not present

## 2021-04-18 DIAGNOSIS — Z23 Encounter for immunization: Secondary | ICD-10-CM | POA: Diagnosis not present

## 2021-04-18 DIAGNOSIS — R42 Dizziness and giddiness: Secondary | ICD-10-CM | POA: Diagnosis not present

## 2021-04-18 DIAGNOSIS — E1169 Type 2 diabetes mellitus with other specified complication: Secondary | ICD-10-CM

## 2021-04-18 DIAGNOSIS — I10 Essential (primary) hypertension: Secondary | ICD-10-CM

## 2021-04-18 LAB — POCT GLYCOSYLATED HEMOGLOBIN (HGB A1C): Hemoglobin A1C: 7.3 % — AB (ref 4.0–5.6)

## 2021-04-18 NOTE — Assessment & Plan Note (Signed)
Chronic, overall stable. Continue current regimen.

## 2021-04-18 NOTE — Progress Notes (Signed)
Patient ID: [name], male    DOB: December 24, 1950, 70 y.o.   MRN: [id number]  This visit was conducted in person.  BP 136/72    Pulse 78    Temp 98 F (36.7 C) (Temporal)    Ht 5\' 8"  (1.727 m)    Wt 228 lb 3 oz (103.5 kg)    SpO2 97%    BMI 34.70 kg/m   Orthostatic VS for the past 24 hrs (Last 3 readings):  BP- Lying BP- Standing at 3 minutes  04/18/21 1006 -- 144/72  04/18/21 1002 140/64 --    CC: 6 mo DM f/u visit  Subjective:   HPI: [name] is a 70 y.o. male presenting on 04/18/2021 for Diabetes (Here for 6 mo f/u.  Pt brought home BP monitor to compare.  Reading in office today, 166 84.), Abdominal Pain (C/o abd pain and  cough. Seen on 12/22/2 at [facility] ED, dx bronchitis, tx, benzonatate and doxycycline. ), and Dizziness (C/o occasional dizziness when sitting up from lying down position. Started mos ago.)   Brother died 2 wks ago - he was chronically ill.  Has started part time job - more active with this. Feels this has helped sugar control.   Recent ER evaluation for cough, records reviewed. CXR normal. Diagnosed with acute bronchitis, treated with benzonatate and doxycycline.   DM - does regularly check sugars and brings log - both fasting and random throughout the day 70s-140s. Compliant with antihyperglycemic regimen which includes: glipizide XL 5mg  daily, jardiance 10mg  daily, metformin 1000mg  bid. Watching portion sizes. Denies low sugars or hypoglycemic symptoms. Denies paresthesias, blurry vision. Last diabetic eye exam 04/2019 - DUE. Glucometer brand: one-touch. Last foot exam: 04/2020. DSME: declines. Lab Results  Component Value Date   HGBA1C 7.3 (A) 04/18/2021   Diabetic Foot Exam - Simple   No data filed    Lab Results  Component Value Date   MICROALBUR 3.2 (H) 06/16/2012     HTN - Compliant with current antihypertensive regimen of carvedilol 3.125mg  bid, losartan 50mg  daily. Does check blood pressures at home and brings log: 130-140s/60-70s,  pulse 60-90s. No low blood pressure readings. Denies HA, vision changes, CP/tightness, SOB, leg swelling. Occasional dizziness.      Relevant past medical, surgical, family and social history reviewed and updated as indicated. Interim medical history since our last visit reviewed. Allergies and medications reviewed and updated. Outpatient Medications Prior to Visit  Medication Sig Dispense Refill   ammonium lactate (AMLACTIN) 12 % cream Apply topically in the morning and at bedtime.     aspirin EC 81 MG tablet Take 81 mg by mouth daily.     atorvastatin (LIPITOR) 80 MG tablet TAKE 1 TABLET BY MOUTH  DAILY 90 tablet 3   baclofen (LIORESAL) 10 MG tablet TAKE 1 TABLET BY MOUTH AT  BEDTIME AS NEEDED FOR  MUSCLE SPASM 90 tablet 1   benzonatate (TESSALON) 100 MG capsule Take 1 capsule (100 mg total) by mouth every 8 (eight) hours. 21 capsule 0   carvedilol (COREG) 3.125 MG tablet TAKE 1 TABLET BY MOUTH  TWICE DAILY WITH A MEAL 180 tablet 3   doxycycline (VIBRAMYCIN) 100 MG capsule Take 1 capsule (100 mg total) by mouth 2 (two) times daily. 20 capsule 0   fluticasone (FLONASE) 50 MCG/ACT nasal spray USE 2 SPRAYS IN BOTH  NOSTRILS DAILY 48 g 3   gabapentin (NEURONTIN) 100 MG capsule TAKE 1 CAPSULE BY MOUTH  TWICE DAILY 180  capsule 3   glipiZIDE (GLUCOTROL XL) 5 MG 24 hr tablet TAKE 1 TABLET BY MOUTH  DAILY WITH BREAKFAST 90 tablet 3   guaiFENesin (MUCINEX) 600 MG 12 hr tablet Take 600 mg by mouth 2 (two) times daily as needed for cough.     JARDIANCE 10 MG TABS tablet TAKE 1 TABLET BY MOUTH  DAILY 90 tablet 3   losartan (COZAAR) 50 MG tablet TAKE 1 TABLET BY MOUTH  DAILY 90 tablet 3   metFORMIN (GLUCOPHAGE) 1000 MG tablet Take 1 tablet (1,000 mg total) by mouth 2 (two) times daily with a meal. 180 tablet 3   omeprazole (PRILOSEC) 40 MG capsule TAKE 1 CAPSULE BY MOUTH  DAILY 90 capsule 3   ONETOUCH ULTRA test strip USE WITH METER TO CHECK  BLOOD SUGAR TWICE DAILY 200 strip 3   oxyCODONE-acetaminophen  (PERCOCET) 10-325 MG tablet Take 1 tablet by mouth every 4 (four) hours as needed for pain. (Patient taking differently: Take 1 tablet by mouth every 4 (four) hours.) 10 tablet 0   polyvinyl alcohol (LIQUIFILM TEARS) 1.4 % ophthalmic solution Place 1 drop into both eyes as needed for dry eyes.     Facility-Administered Medications Prior to Visit  Medication Dose Route Frequency Provider Last Rate Last Admin   triamcinolone (KENALOG) 0.1 % cream   Topical BID Wallene Huh, DPM         Per HPI unless specifically indicated in ROS section below Review of Systems  Objective:  BP 136/72    Pulse 78    Temp 98 F (36.7 C) (Temporal)    Ht 5\' 8"  (1.727 m)    Wt 228 lb 3 oz (103.5 kg)    SpO2 97%    BMI 34.70 kg/m   Wt Readings from Last 3 Encounters:  04/18/21 228 lb 3 oz (103.5 kg)  04/13/21 240 lb (108.9 kg)  11/21/20 231 lb (104.8 kg)      Physical Exam Vitals and nursing note reviewed.  Constitutional:      Appearance: Normal appearance. He is not ill-appearing.  Eyes:     Extraocular Movements: Extraocular movements intact.     Conjunctiva/sclera: Conjunctivae normal.     Pupils: Pupils are equal, round, and reactive to light.  Cardiovascular:     Rate and Rhythm: Normal rate and regular rhythm.     Pulses: Normal pulses.     Heart sounds: Normal heart sounds. No murmur heard. Pulmonary:     Effort: Pulmonary effort is normal. No respiratory distress.     Breath sounds: Normal breath sounds. No wheezing, rhonchi or rales.  Musculoskeletal:     Right lower leg: No edema.     Left lower leg: No edema.     Comments: See HPI for foot exam if done  Skin:    General: Skin is warm and dry.     Findings: No rash.  Neurological:     Mental Status: He is alert.  Psychiatric:        Mood and Affect: Mood normal.        Behavior: Behavior normal.      Results for orders placed or performed in visit on 04/18/21  POCT glycosylated hemoglobin (Hb A1C)  Result Value Ref Range    Hemoglobin A1C 7.3 (A) 4.0 - 5.6 %   HbA1c POC (<> result, manual entry)     HbA1c, POC (prediabetic range)     HbA1c, POC (controlled diabetic range)      Assessment &  Plan:  This visit occurred during the SARS-CoV-2 public health emergency.  Safety protocols were in place, including screening questions prior to the visit, additional usage of staff PPE, and extensive cleaning of exam room while observing appropriate contact time as indicated for disinfecting solutions.   Problem List Items Addressed This Visit     Type 2 diabetes mellitus with other specified complication ([place]) - Primary    Chronic, stable on current regimen - continue this.  Foot exam today. Will request latest eye exam.       Relevant Orders   POCT glycosylated hemoglobin (Hb A1C) (Completed)   Ambulatory referral to diabetic education   Essential hypertension    Chronic, overall stable. Continue current regimen.       Lightheadedness    Normal orthostatic vital signs today.  Encouraged good hydration status.  Update if worsening symptoms.       Acute bronchitis    Resolving after tessalon/doxycycline treatment initiated by ER. Abdominal soreness due to abdominal muscle strain from cough - also improving.       Other Visit Diagnoses     Need for influenza vaccination       Relevant Orders   Flu Vaccine QUAD High Dose(Fluad) (Completed)        No orders of the defined types were placed in this encounter.  Orders Placed This Encounter  Procedures   Flu Vaccine QUAD High Dose(Fluad)   Ambulatory referral to diabetic education    Referral Priority:   Routine    Referral Type:   Consultation    Referral Reason:   Specialty Services Required    Number of Visits Requested:   1   POCT glycosylated hemoglobin (Hb A1C)     Patient Instructions  Flu shot today  We will request latest eye exam records from Dr Katy Fitch in [place].  We will refer you to diabetes classes.  Continue current medicines.   Return in 6 months for physical.   Follow up plan: Return in about 6 months (around 10/17/2021) for annual exam, prior fasting for blood work.  Ria Bush, MD

## 2021-04-18 NOTE — Assessment & Plan Note (Signed)
Resolving after tessalon/doxycycline treatment initiated by ER. Abdominal soreness due to abdominal muscle strain from cough - also improving.

## 2021-04-18 NOTE — Patient Instructions (Addendum)
Flu shot today  We will request latest eye exam records from Dr Katy Fitch in [place].  We will refer you to diabetes classes.  Continue current medicines.  Return in 6 months for physical.

## 2021-04-18 NOTE — Assessment & Plan Note (Addendum)
Chronic, stable on current regimen - continue this.  Foot exam today. Will request latest eye exam.

## 2021-04-18 NOTE — Assessment & Plan Note (Signed)
Normal orthostatic vital signs today.  Encouraged good hydration status.  Update if worsening symptoms.

## 2021-04-21 ENCOUNTER — Encounter: Payer: Self-pay | Admitting: Family Medicine

## 2021-04-21 DIAGNOSIS — H16223 Keratoconjunctivitis sicca, not specified as Sjogren's, bilateral: Secondary | ICD-10-CM | POA: Insufficient documentation

## 2021-04-25 ENCOUNTER — Other Ambulatory Visit: Payer: Self-pay | Admitting: Family Medicine

## 2021-04-25 NOTE — Telephone Encounter (Signed)
Last filled on 12/19/20 # 90 with 1 refill LOV 04/18/21 follow up-DM  No future appointments

## 2021-04-26 ENCOUNTER — Ambulatory Visit: Payer: Medicare Other | Admitting: Podiatry

## 2021-04-26 ENCOUNTER — Other Ambulatory Visit: Payer: Self-pay

## 2021-04-26 DIAGNOSIS — B351 Tinea unguium: Secondary | ICD-10-CM | POA: Diagnosis not present

## 2021-04-26 DIAGNOSIS — M79675 Pain in left toe(s): Secondary | ICD-10-CM

## 2021-04-26 DIAGNOSIS — E1142 Type 2 diabetes mellitus with diabetic polyneuropathy: Secondary | ICD-10-CM

## 2021-04-26 DIAGNOSIS — M79674 Pain in right toe(s): Secondary | ICD-10-CM

## 2021-04-26 NOTE — Progress Notes (Signed)
°  Subjective:  Patient ID: [name], male    DOB: 03-10-1951,  MRN: [id number]  Chief Complaint  Patient presents with   Nail Problem    Nail trim    71 y.o. male returns for the above complaint.  Patient presents with thickened elongated dystrophic toenails x10.  Patient states is painful to touch.  Patient would like to have them debrided down.  He denies any other acute complaints.  He is having hard time to debride his own toenails.  Objective:  There were no vitals filed for this visit. Podiatric Exam: Vascular: dorsalis pedis and posterior tibial pulses are palpable bilateral. Capillary return is immediate. Temperature gradient is WNL. Skin turgor WNL  Sensorium: Normal Semmes Weinstein monofilament test. Normal tactile sensation bilaterally. Nail Exam: Pt has thick disfigured discolored nails with subungual debris noted bilateral entire nail hallux through fifth toenails.  Pain on palpation to the nails. Ulcer Exam: There is no evidence of ulcer or pre-ulcerative changes or infection. Orthopedic Exam: Muscle tone and strength are WNL. No limitations in general ROM. No crepitus or effusions noted. HAV  B/L.  Hammer toes 2-5  B/L. Skin: No Porokeratosis. No infection or ulcers.  Xerosis improving.Marland Kitchen  No fissuring noted no clinical signs of infection noted    Assessment & Plan:   1. Diabetic polyneuropathy associated with type 2 diabetes mellitus (HCC)   2. Pain due to onychomycosis of toenails of both feet       Patient was evaluated and treated and all questions answered.  Xerosis bilateral lower extremity -Clinically improving.  I have asked him to continue applying ammonium lactate lotion twice a day.  He states understanding.  Onychomycosis with pain  -Nails palliatively debrided as below. -Educated on self-care  Procedure: Nail Debridement Rationale: pain  Type of Debridement: manual, sharp debridement. Instrumentation: Nail nipper, rotary burr. Number of  Nails: 10  Procedures and Treatment: Consent by patient was obtained for treatment procedures. The patient understood the discussion of treatment and procedures well. All questions were answered thoroughly reviewed. Debridement of mycotic and hypertrophic toenails, 1 through 5 bilateral and clearing of subungual debris. No ulceration, no infection noted.  Return Visit-Office Procedure: Patient instructed to return to the office for a follow up visit 3 months for continued evaluation and treatment.  [name], DPM    No follow-ups on file.

## 2021-05-17 ENCOUNTER — Telehealth: Payer: Self-pay | Admitting: Family Medicine

## 2021-05-17 NOTE — Telephone Encounter (Signed)
°  Encourage patient to contact the pharmacy for refills or they can request refills through [place]:  Please schedule appointment if longer than 1 year  NEXT APPOINTMENT DATE:01/08/22  MEDICATION:JARDIANCE 10 MG TABS tablet  Is the patient out of medication? PT STATED THAT HIS INCOME IS 1,824.00, AND COULD YOU CALL THIS # [phone] TO TELL THEM  PHARMACY:[id number]  [place] [address]  Let patient know to contact pharmacy at the end of the day to make sure medication is ready.  Please notify patient to allow 48-72 hours to process  CLINICAL FILLS OUT ALL BELOW:   LAST REFILL:  QTY:  REFILL DATE:    OTHER COMMENTS:    Okay for refill?  Please advise

## 2021-05-18 NOTE — Telephone Encounter (Signed)
Lvm asking pt to call back.  Need clarification of message.

## 2021-05-19 NOTE — Telephone Encounter (Signed)
Lvm asking pt to call back.  Need clarification of message.

## 2021-05-23 NOTE — Telephone Encounter (Addendum)
Lvm asking pt to call back.  Need clarification of message.  If he's referring to his income for the pt assistance program, that's something we can neither confirm nor deny.  He would need to speak with them directly.

## 2021-05-24 NOTE — Telephone Encounter (Signed)
Lvm asking pt to call back.  Need clarification of message.  If he's referring to his income for the pt assistance program, that's something we can neither confirm nor deny.  He would need to speak with them directly.

## 2021-05-26 NOTE — Telephone Encounter (Signed)
Lvm asking pt to call back.  Need clarification of message.  If he's referring to his income for the pt assistance program, that's something we can neither confirm nor deny.  He would need to speak with them directly.

## 2021-05-30 ENCOUNTER — Telehealth: Payer: Self-pay | Admitting: Family Medicine

## 2021-05-30 NOTE — Telephone Encounter (Signed)
Pt called asking for a call back to discuss medication JARDIANCE 10 MG TABS tablet. Please advise.

## 2021-06-07 NOTE — Telephone Encounter (Signed)
Spoke to patient by telephone and was advised that he has reached out to the company regarding the Patient's assistance program. Patient stated to disregard this phone call because he has sent everything that was needed by the company. Patient stated that he spoke to the representative at the company and they are working on getting his medication to him.

## 2021-06-07 NOTE — Telephone Encounter (Signed)
Spoke to patient by telephone and was advised that he has spoken to the representative at the Patients assistance program and they are working on getting his medication to him. See other phone note.

## 2021-07-14 NOTE — Progress Notes (Signed)
CARDIOLOGY CONSULT NOTE  ? ? ? ? ? ?Patient ID: ?[name] ?MRN: [id number] ?DOB/AGE: 07/28/1950 71 y.o. ? ?Admit date: (Not on file) ?Referring Physician: Danise Mina ?Primary Physician: Ria Bush, MD ?Primary Cardiologist: Radford Pax before ?Reason for Consultation: Aortic Aneurysm  ? ? ?HPI:  71 y.o. re established care 07/26/20  History of HTN, DM-2, HLD, GERD, Prostate cancer and remote history of substance abuse. Had normal cath in 2013 And again in May 2020. Dr Irish Lack note indicates severe right subclavian tortuosity and not to use right radial for cath again EF normal 55-60% TTE done 04/12/20 showed aortic root 4.3 cm with EF 55-60% tricuspid AV with mild AR ? ?CT done 07/26/20 showed no aortic aneurysm  ? ?He is long time divorced. Retired from cooking Has a daughter in town ?Works out and [employer]  ? ?No cardiac complaints ? ?Likes his [place] ? ?No cardiac symptoms  ? ?ROS ?All other systems reviewed and negative except as noted above ? ?Past Medical History:  ?Diagnosis Date  ? Acquired dilation of ascending aorta and aortic root (HCC)   ? 46m ascending aorta  ? Allergic rhinitis   ? Anxiety   ? [place]rosclerosis of aorta ([place]   ? CAD (coronary artery disease) cardiologist-- dr tTressia Minersturner  ? long hx atypical chest pain--- cardiac cath 08-24-2002 in epic showed normal coronaries;   03-17-2007  nuclear stress test showed ?small infarct , ef 60%;  cath done 12-31-2007 for recurrent CP showed nonob cad;   08-27-2018 stress test showed intermediate risk w/ apex ischemia, nuclear ef 61%;   cath done 08-29-2018 showed Nonobstructive 25% plaque in LAD   ? Chronic neck pain   ? narcotic dependant  ? DDD (degenerative disc disease), cervical   ? Depression   ? Diabetic neuropathy ([place]   ? Diverticulosis 2013  ? by CT scan  ? Erectile dysfunction 08/01/2015  ? Essential hypertension 01/01/2008  ? Fatty liver 07/31/2018  ? [place],  followed by pcp  ? GERD (gastroesophageal reflux disease)   ?  Hemorrhoids   ? History of anemia   ? History of cocaine abuse ([place]   ? per pt quit 05/ 2004  ? History of cocaine use 2004  ? History of colon polyps   ? History of epididymitis   ? History of esophageal stricture   ? s/p  dilatation 2009;  12-30-2018  ? History of gastric ulcer   ? 12-30-2018  EGD showed non-bleeding gastric ulcer  ? History of prostatitis 08/2011  ? History of transient ischemic attack (TIA)   ? remote hx , related to cocaine abuse which pt quit 2005  ? Hyperlipidemia   ? Hypertension   ? Essential  ? OSA (obstructive sleep apnea)   ? study 05-07-2008 in epic , mild ,  pt does not use cpap  ? Pain due to onychomycosis of toenails of both feet 10/15/2018  ? Prostate cancer ([facility] urologist--- dr herrick/  oncologist--- dr mTammi Klippel ? first dx 01/ 2018 w/ Stage T2a and Gleason 3+3 , active survillance;  repear bx 04-21-2019 Gleason 3+4,  scheduled for brachytherapy 07-03-2019  ? T2DM (type 2 diabetes mellitus) ([place]   ? followed by pcp---  (03-04-201  checks blood sugar 3 times daily,  fasting sugar's-- 140-150  ?  ?Family History  ?Problem Relation Age of Onset  ? Diabetes Mother   ? Hypertension Mother   ? Hyperlipidemia Mother   ? Diabetes Father   ? Diabetes  Sister   ? Hypertension Sister   ? Hypertension Brother   ? Diabetes Brother   ? Diabetes Brother   ? Hypertension Brother   ? Diabetes Brother   ? Heart disease Maternal Uncle   ?     Heart failure  ? Cancer Paternal Uncle   ?     Prostate  ? Cancer Paternal Grandfather   ?     Prostate  ? Stroke Neg Hx   ? Esophageal cancer Neg Hx   ? Rectal cancer Neg Hx   ? Stomach cancer Neg Hx   ? Breast cancer Neg Hx   ? Colon cancer Neg Hx   ?  ?Social History  ? ?Socioeconomic History  ? Marital status: Single  ?  Spouse name: Not on file  ? Number of children: 1  ? Years of education: Not on file  ? Highest education level: Not on file  ?Occupational History  ? Occupation: [employer] previously  ?  Employer: UNEMPLOYED  ?  Comment: Now on  disability after neck surgery  ?Tobacco Use  ? Smoking status: Never  ? Smokeless tobacco: Never  ?Vaping Use  ? Vaping Use: Never used  ?Substance and Sexual Activity  ? Alcohol use: No  ?  Alcohol/week: 0.0 standard drinks  ? Drug use: Not Currently  ?  Comment: H/O marijuana (last 2005); Cocaine 5/04  ? Sexual activity: Not on file  ?Other Topics Concern  ? Not on file  ?Social History Narrative  ? Lives alone  ? 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  ? Activity: no regular exercise, wants to start walking  ? Diet: good water, fruits/vegetables daily.  ?   ? Retired  ? 1 daughter and 1 granddaughter  ? ?Social Determinants of Health  ? ?Financial Resource Strain: Low Risk   ? Difficulty of Paying Living Expenses: Not very hard  ?Food Insecurity: Not on file  ?Transportation Needs: Not on file  ?Physical Activity: Not on file  ?Stress: Not on file  ?Social Connections: Not on file  ?Intimate Partner Violence: Not on file  ?  ?Past Surgical History:  ?Procedure Laterality Date  ? ANTERIOR CERVICAL DECOMP/DISCECTOMY FUSION  06-24-2008  and 10-28-2008 [facility]   ? 06-25-2008  C7 -- T1;    10-28-2008  C5 -- C7  ? CARDIAC CATHETERIZATION  08-24-2002   dr gamble  '@[facility]   ? normal coronaries  ? CARDIAC CATHETERIZATION  12-31-2007    dr Johnsie Cancel  ? nonobstructive CAD  ? CARPAL TUNNEL RELEASE Right 2000  approx.  ? CATARACT EXTRACTION W/ INTRAOCULAR LENS  IMPLANT, BILATERAL  2016  ? COLONOSCOPY  10/11/2011  ? severe diverticulosis, int hemorrhoids, rec rpt 5 yrs Carlean Purl)  ? COLONOSCOPY  12/2018  ? benign rectal polyp Carlean Purl)  ? DIRECT LARYNGOSCOPY  11/02/2008   '@[facility]   ? Extubation under anesthesia post op complication from ACDF  ? ESOPHAGOGASTRODUODENOSCOPY  10/11/2011  ? WNL Carlean Purl)  ? ESOPHAGOGASTRODUODENOSCOPY  09/11-2018  ? gastric ulcer, gastritis, dilated esophageal stenosis, limit NSAIDs continue PPI,  no f/u planned Carlean Purl)  ? EXCISION MASS NECK N/A 08/29/2016  ? Procedure: EXCISION MASS POSTERIOR NECK;  Surgeon: Erroll Luna, MD;  Location: [facility];  Service: General;  Laterality: N/A;  ? HEMATOMA EVACUATION  10/29/2008   '@[facility]   ? post op ACDF 10-28-2008  ? LAPAROSCOPIC INGUINAL HERNIA REPAIR Bilateral 11-22-2006   @  [facility]  ? AND EXCISION RLQ ABDOMINAL LIPOMA  ? LEFT HEART CATH AND CORONARY ANGIOGRAPHY N/A 08/29/2018  ? Procedure: LEFT HEART CATH AND CORONARY ANGIOGRAPHY;  Surgeon: Jettie Booze, MD;  Location: [facility];  Service: Cardiovascular;  Laterality: N/A;  ? RADIOACTIVE SEED IMPLANT N/A 07/03/2019  ? Procedure: RADIOACTIVE SEED IMPLANT/BRACHYTHERAPY IMPLANT;  Surgeon: Ardis Hughs, MD;  Location: [facility];  Service: Urology;  Laterality: N/A;  ? SHOULDER ARTHROSCOPY WITH SUBACROMIAL DECOMPRESSION AND OPEN ROTATOR C Left 01/23/2013  ? Procedure: LEFT SHOULDER ARTHROSCOPY WITH SUBACROMIAL DECOMPRESSION AND MINI OPEN ROTATOR CUFF REPAIR, AND OPEN DISTAL CLAVICLE RESECTION;  Surgeon: Augustin Schooling, MD;  Location: [facility];  Service: Orthopedics;  Laterality: Left;  ? SHOULDER SURGERY Right 05/2011  ? SPACE OAR INSTILLATION N/A 07/03/2019  ? Procedure: SPACE OAR INSTILLATION;  Surgeon: Ardis Hughs, MD;  Location: [facility];  Service: Urology;  Laterality: N/A;  ? TONSILLECTOMY  child  ? TRIGGER FINGER RELEASE Right 12-12-2005   '[facility]   ? Right long finger  ?  ? ? ?Current Outpatient Medications:  ?  ammonium lactate (AMLACTIN) 12 % cream, Apply topically in the morning and at bedtime., Disp: , Rfl:  ?  aspirin EC 81 MG tablet, Take 81 mg by mouth daily., Disp: , Rfl:  ?  atorvastatin (LIPITOR) 80 MG tablet, TAKE 1 TABLET BY MOUTH  DAILY, Disp: 90 tablet, Rfl: 3 ?  baclofen (LIORESAL) 10 MG tablet, TAKE 1 TABLET BY MOUTH AT  BEDTIME AS NEEDED FOR  MUSCLE SPASM, Disp: 90 tablet, Rfl: 1 ?  benzonatate (TESSALON) 100 MG capsule, Take 1  capsule (100 mg total) by mouth every 8 (eight) hours., Disp: 21 capsule, Rfl: 0 ?  carvedilol (COREG) 3.125 MG tablet, TAKE 1 TABLET BY MOUTH  TWICE DAILY WITH A MEAL, Disp: 180 tablet, Rfl: 3 ?  fluticasone (FLO

## 2021-07-18 ENCOUNTER — Other Ambulatory Visit: Payer: Self-pay | Admitting: Family Medicine

## 2021-07-18 DIAGNOSIS — E118 Type 2 diabetes mellitus with unspecified complications: Secondary | ICD-10-CM

## 2021-07-18 DIAGNOSIS — Z79891 Long term (current) use of opiate analgesic: Secondary | ICD-10-CM | POA: Diagnosis not present

## 2021-07-18 DIAGNOSIS — M961 Postlaminectomy syndrome, not elsewhere classified: Secondary | ICD-10-CM | POA: Diagnosis not present

## 2021-07-18 DIAGNOSIS — M5030 Other cervical disc degeneration, unspecified cervical region: Secondary | ICD-10-CM | POA: Diagnosis not present

## 2021-07-26 ENCOUNTER — Ambulatory Visit: Payer: Medicare Other | Admitting: Cardiovascular Disease

## 2021-07-26 VITALS — BP 110/50 | HR 49 | Ht 67.0 in | Wt 231.0 lb

## 2021-07-26 DIAGNOSIS — I10 Essential (primary) hypertension: Secondary | ICD-10-CM | POA: Diagnosis not present

## 2021-07-26 DIAGNOSIS — E7800 Pure hypercholesterolemia, unspecified: Secondary | ICD-10-CM

## 2021-07-26 DIAGNOSIS — R079 Chest pain, unspecified: Secondary | ICD-10-CM | POA: Diagnosis not present

## 2021-07-26 DIAGNOSIS — I2510 Atherosclerotic heart disease of native coronary artery without angina pectoris: Secondary | ICD-10-CM

## 2021-07-26 NOTE — Patient Instructions (Signed)
Medication Instructions:  °Your physician recommends that you continue on your current medications as directed. Please refer to the Current Medication list given to you today. ° °*If you need a refill on your cardiac medications before your next appointment, please call your pharmacy* ° °Lab Work: °If you have labs (blood work) drawn today and your tests are completely normal, you will receive your results only by: °MyChart Message (if you have MyChart) OR °A paper copy in the mail °If you have any lab test that is abnormal or we need to change your treatment, we will call you to review the results. ° °Testing/Procedures: °None ordered today. ° °Follow-Up: °At [facility], you and your health needs are our priority.  As part of our continuing mission to provide you with exceptional heart care, we have created designated Provider Care Teams.  These Care Teams include your primary Cardiologist (physician) and Advanced Practice Providers (APPs -  Physician Assistants and Nurse Practitioners) who all work together to provide you with the care you need, when you need it. ° °We recommend signing up for the patient portal called "MyChart".  Sign up information is provided on this After Visit Summary.  MyChart is used to connect with patients for Virtual Visits (Telemedicine).  Patients are able to view lab/test results, encounter notes, upcoming appointments, etc.  Non-urgent messages can be sent to your provider as well.   °To learn more about what you can do with MyChart, go to [email or web address].   ° °Your next appointment:   °12 month(s) ° °The format for your next appointment:   °In Person ° °Provider:   °Peter Nishan, MD { ° °

## 2021-08-09 DIAGNOSIS — H0102B Squamous blepharitis left eye, upper and lower eyelids: Secondary | ICD-10-CM | POA: Diagnosis not present

## 2021-08-09 DIAGNOSIS — E119 Type 2 diabetes mellitus without complications: Secondary | ICD-10-CM | POA: Diagnosis not present

## 2021-08-09 DIAGNOSIS — H35372 Puckering of macula, left eye: Secondary | ICD-10-CM | POA: Diagnosis not present

## 2021-08-09 DIAGNOSIS — H0102A Squamous blepharitis right eye, upper and lower eyelids: Secondary | ICD-10-CM | POA: Diagnosis not present

## 2021-08-09 DIAGNOSIS — Z961 Presence of intraocular lens: Secondary | ICD-10-CM | POA: Diagnosis not present

## 2021-08-09 DIAGNOSIS — H16223 Keratoconjunctivitis sicca, not specified as Sjogren's, bilateral: Secondary | ICD-10-CM | POA: Diagnosis not present

## 2021-08-09 DIAGNOSIS — H1045 Other chronic allergic conjunctivitis: Secondary | ICD-10-CM | POA: Diagnosis not present

## 2021-08-09 DIAGNOSIS — H40013 Open angle with borderline findings, low risk, bilateral: Secondary | ICD-10-CM | POA: Diagnosis not present

## 2021-08-09 DIAGNOSIS — H353131 Nonexudative age-related macular degeneration, bilateral, early dry stage: Secondary | ICD-10-CM | POA: Diagnosis not present

## 2021-08-09 LAB — HM DIABETES EYE EXAM

## 2021-08-18 ENCOUNTER — Encounter: Payer: Self-pay | Admitting: Family Medicine

## 2021-08-24 ENCOUNTER — Other Ambulatory Visit: Payer: Self-pay | Admitting: Family Medicine

## 2021-08-24 NOTE — Telephone Encounter (Signed)
Baclofen ?Last filled:  06/28/21, #90 ?Last OV:  04/18/21, 6 mo DM f/u ?Next OV:  none ?

## 2021-10-03 ENCOUNTER — Other Ambulatory Visit: Payer: Self-pay | Admitting: Family Medicine

## 2021-10-03 DIAGNOSIS — E118 Type 2 diabetes mellitus with unspecified complications: Secondary | ICD-10-CM

## 2021-10-03 NOTE — Telephone Encounter (Signed)
  Encourage patient to contact the pharmacy for refills or they can request refills through [place]:  12.27.22  NEXT APPOINTMENT DATE: 6.27.23  MEDICATION: ONETOUCH ULTRA test strip  Is the patient out of medication? Y  PHARMACY:  [facility] ([place], [place] Phone:  [phone]  Fax:  [phone]     Patient states he is testing his blood sugar 4 times daily, so current script is not enough, he is completely out of strips at this time ([facility] is going to send him an emergency dose)  Let patient know to contact pharmacy at the end of the day to make sure medication is ready.  Please notify patient to allow 48-72 hours to process

## 2021-10-04 ENCOUNTER — Other Ambulatory Visit: Payer: Self-pay

## 2021-10-04 DIAGNOSIS — E118 Type 2 diabetes mellitus with unspecified complications: Secondary | ICD-10-CM

## 2021-10-04 MED ORDER — ONETOUCH ULTRA VI STRP: ORAL_STRIP | 3 refills | Status: DC

## 2021-10-04 NOTE — Telephone Encounter (Signed)
Please confirm that patient is to be checking his blood sugar four times a day. Prescription shows twice a day. Please confirm if directions need to be changed. Last office visit 04/18/21 Upcoming appointment 10/17/21 Last refill 07/18/21 #200/3 refills

## 2021-10-05 NOTE — Telephone Encounter (Signed)
He is not on insulin - he should only be checking BID and as needed.  Why is he checking so frequently? Insurance will not cover more than twice daily checks off insulin.

## 2021-10-06 NOTE — Telephone Encounter (Signed)
Patient notified as instructed by telephone and verbalized understanding. 

## 2021-10-06 NOTE — Telephone Encounter (Signed)
Patient notified as instructed by telephone and verbalized understanding. Patient stated the reason he has been checking his blood sugar more often is because his blood sugar has been dropping almost ever day. Patient stated that he starts feeling bad and checks his blood sugar and it has dropped to 75-89 and it has been as low as 45-47. Patient wants to know if he should make some adjustments on his diabetes medication. Patient stated that he is good on test strips now because he did get his refill.

## 2021-10-06 NOTE — Telephone Encounter (Signed)
Please hold glipizide XL.  Let us know if ongoing low sugars despite stopping this medication.

## 2021-10-06 NOTE — Addendum Note (Signed)
Addended by: Ria Bush on: 10/06/2021 01:56 PM   Modules accepted: Orders

## 2021-10-17 ENCOUNTER — Encounter: Payer: Self-pay | Admitting: Family Medicine

## 2021-10-17 ENCOUNTER — Ambulatory Visit (INDEPENDENT_AMBULATORY_CARE_PROVIDER_SITE_OTHER): Payer: Medicare Other | Admitting: Family Medicine

## 2021-10-17 VITALS — BP 136/80 | HR 79 | Temp 97.2°F | Ht 67.5 in | Wt 231.1 lb

## 2021-10-17 DIAGNOSIS — D509 Iron deficiency anemia, unspecified: Secondary | ICD-10-CM | POA: Diagnosis not present

## 2021-10-17 DIAGNOSIS — I10 Essential (primary) hypertension: Secondary | ICD-10-CM

## 2021-10-17 DIAGNOSIS — Z0000 Encounter for general adult medical examination without abnormal findings: Secondary | ICD-10-CM | POA: Diagnosis not present

## 2021-10-17 DIAGNOSIS — E1169 Type 2 diabetes mellitus with other specified complication: Secondary | ICD-10-CM | POA: Diagnosis not present

## 2021-10-17 DIAGNOSIS — K219 Gastro-esophageal reflux disease without esophagitis: Secondary | ICD-10-CM

## 2021-10-17 DIAGNOSIS — K760 Fatty (change of) liver, not elsewhere classified: Secondary | ICD-10-CM

## 2021-10-17 DIAGNOSIS — C61 Malignant neoplasm of prostate: Secondary | ICD-10-CM

## 2021-10-17 DIAGNOSIS — E785 Hyperlipidemia, unspecified: Secondary | ICD-10-CM

## 2021-10-17 DIAGNOSIS — E1142 Type 2 diabetes mellitus with diabetic polyneuropathy: Secondary | ICD-10-CM

## 2021-10-17 DIAGNOSIS — F341 Dysthymic disorder: Secondary | ICD-10-CM

## 2021-10-17 DIAGNOSIS — Z7189 Other specified counseling: Secondary | ICD-10-CM

## 2021-10-17 DIAGNOSIS — G4733 Obstructive sleep apnea (adult) (pediatric): Secondary | ICD-10-CM

## 2021-10-17 DIAGNOSIS — I77819 Aortic ectasia, unspecified site: Secondary | ICD-10-CM

## 2021-10-17 LAB — CBC WITH DIFFERENTIAL/PLATELET
Basophils Absolute: 0 10*3/uL (ref 0.0–0.1)
Basophils Relative: 0.3 % (ref 0.0–3.0)
Eosinophils Absolute: 0.1 10*3/uL (ref 0.0–0.7)
Eosinophils Relative: 1.7 % (ref 0.0–5.0)
HCT: 41.2 % (ref 39.0–52.0)
Hemoglobin: 13.6 g/dL (ref 13.0–17.0)
Lymphocytes Relative: 27.7 % (ref 12.0–46.0)
Lymphs Abs: 1.5 10*3/uL (ref 0.7–4.0)
MCHC: 33.2 g/dL (ref 30.0–36.0)
MCV: 93.7 fl (ref 78.0–100.0)
Monocytes Absolute: 0.7 10*3/uL (ref 0.1–1.0)
Monocytes Relative: 12.6 % — ABNORMAL HIGH (ref 3.0–12.0)
Neutro Abs: 3 10*3/uL (ref 1.4–7.7)
Neutrophils Relative %: 57.7 % (ref 43.0–77.0)
Platelets: 243 10*3/uL (ref 150.0–400.0)
RBC: 4.39 Mil/uL (ref 4.22–5.81)
RDW: 15.2 % (ref 11.5–15.5)
WBC: 5.2 10*3/uL (ref 4.0–10.5)

## 2021-10-17 LAB — LIPID PANEL
Cholesterol: 108 mg/dL (ref 0–200)
HDL: 41.3 mg/dL (ref >39.00)
LDL Cholesterol: 52 mg/dL (ref 0–99)
NonHDL: 66.77
Total CHOL/HDL Ratio: 3
Triglycerides: 75 mg/dL (ref 0.0–149.0)
VLDL: 15 mg/dL (ref 0.0–40.0)

## 2021-10-17 LAB — IBC PANEL
Iron: 101 ug/dL (ref 42–165)
Saturation Ratios: 23 % (ref 20.0–50.0)
TIBC: 438.2 ug/dL (ref 250.0–450.0)
Transferrin: 313 mg/dL (ref 212.0–360.0)

## 2021-10-17 LAB — COMPREHENSIVE METABOLIC PANEL
ALT: 17 U/L (ref 0–53)
AST: 10 U/L (ref 0–37)
Albumin: 4.3 g/dL (ref 3.5–5.2)
Alkaline Phosphatase: 41 U/L (ref 39–117)
BUN: 14 mg/dL (ref 6–23)
CO2: 29 mEq/L (ref 19–32)
Calcium: 9.3 mg/dL (ref 8.4–10.5)
Chloride: 103 mEq/L (ref 96–112)
Creatinine, Ser: 0.9 mg/dL (ref 0.40–1.50)
GFR: 85.83 mL/min (ref >60.00)
Glucose, Bld: 108 mg/dL — ABNORMAL HIGH (ref 70–99)
Potassium: 4.2 mEq/L (ref 3.5–5.1)
Sodium: 140 mEq/L (ref 135–145)
Total Bilirubin: 0.4 mg/dL (ref 0.2–1.2)
Total Protein: 7.2 g/dL (ref 6.0–8.3)

## 2021-10-17 LAB — MICROALBUMIN / CREATININE URINE RATIO
Creatinine,U: 69.9 mg/dL
Microalb Creat Ratio: 21.9 mg/g (ref 0.0–30.0)
Microalb, Ur: 15.3 mg/dL — ABNORMAL HIGH (ref 0.0–1.9)

## 2021-10-17 LAB — HEMOGLOBIN A1C: Hgb A1c MFr Bld: 7.4 % — ABNORMAL HIGH (ref 4.6–6.5)

## 2021-10-17 LAB — FERRITIN: Ferritin: 15.9 ng/mL — ABNORMAL LOW (ref 22.0–322.0)

## 2021-10-17 NOTE — Assessment & Plan Note (Signed)
Chronic, stable on current regimen - continue this.  

## 2021-10-17 NOTE — Assessment & Plan Note (Signed)
Now regularly sees cardiology.

## 2021-10-17 NOTE — Assessment & Plan Note (Signed)
Remains off CPAP

## 2021-11-07 DIAGNOSIS — M961 Postlaminectomy syndrome, not elsewhere classified: Secondary | ICD-10-CM | POA: Diagnosis not present

## 2021-11-07 DIAGNOSIS — Z79891 Long term (current) use of opiate analgesic: Secondary | ICD-10-CM | POA: Diagnosis not present

## 2021-11-07 DIAGNOSIS — M5030 Other cervical disc degeneration, unspecified cervical region: Secondary | ICD-10-CM | POA: Diagnosis not present

## 2021-11-16 ENCOUNTER — Other Ambulatory Visit: Payer: Self-pay | Admitting: Family Medicine

## 2021-12-15 ENCOUNTER — Ambulatory Visit: Payer: Medicare Other | Admitting: Podiatry

## 2021-12-15 DIAGNOSIS — M7751 Other enthesopathy of right foot: Secondary | ICD-10-CM

## 2021-12-15 NOTE — Progress Notes (Signed)
Subjective:  Patient ID: [name], male    DOB: 12-07-1950,  MRN: [id number]  Chief Complaint  Patient presents with   Diabetes    Nail trim     71 y.o. male presents with the above complaint.  Patient presents with right first MTP capsulitis pain with range of motion.  Patient states came out of nowhere has been hurting is getting little bit better he would like significant steroid shot.  He has not seen anyone else prior to seeing me.  Hurts with ambulation hurts with pressure.   Review of Systems: Negative except as noted in the HPI. Denies N/V/F/Ch.  Past Medical History:  Diagnosis Date   Acquired dilation of ascending aorta and aortic root ([place])    21m ascending aorta   Allergic rhinitis    Anxiety    Atherosclerosis of aorta (HCC)    CAD (coronary artery disease) cardiologist-- dr tTressia Minersturner   long hx atypical chest pain--- cardiac cath 08-24-2002 in epic showed normal coronaries;   03-17-2007  nuclear stress test showed ?small infarct , ef 60%;  cath done 12-31-2007 for recurrent CP showed nonob cad;   08-27-2018 stress test showed intermediate risk w/ apex ischemia, nuclear ef 61%;   cath done 08-29-2018 showed Nonobstructive 25% plaque in LAD    Chronic neck pain    narcotic dependant   DDD (degenerative disc disease), cervical    Depression    Diabetic neuropathy ([place]    Diverticulosis 2013   by CT scan   Erectile dysfunction 08/01/2015   Essential hypertension 01/01/2008   Fatty liver 07/31/2018   By [place],  followed by pcp   GERD (gastroesophageal reflux disease)    Hemorrhoids    History of anemia    History of cocaine abuse ([place]    per pt quit 05/ 2004   History of cocaine use 2004   History of colon polyps    History of epididymitis    History of esophageal stricture    s/p  dilatation 2009;  12-30-2018   History of gastric ulcer    12-30-2018  EGD showed non-bleeding gastric ulcer   History of prostatitis 08/2011   History of transient  ischemic attack (TIA)    remote hx , related to cocaine abuse which pt quit 2005   Hyperlipidemia    Hypertension    Essential   OSA (obstructive sleep apnea)    study 05-07-2008 in epic , mild ,  pt does not use cpap   Pain due to onychomycosis of toenails of both feet 10/15/2018   Prostate cancer ([facility] urologist--- dr herrick/  oncologist--- dr mTammi Klippel  first dx 01/ 2018 w/ Stage T2a and Gleason 3+3 , active survillance;  repear bx 04-21-2019 Gleason 3+4,  scheduled for brachytherapy 07-03-2019   T2DM (type 2 diabetes mellitus) ([place]    followed by pcp---  (03-04-201  checks blood sugar 3 times daily,  fasting sugar's-- 140-150    Current Outpatient Medications:    ammonium lactate (AMLACTIN) 12 % cream, Apply topically in the morning and at bedtime., Disp: , Rfl:    aspirin EC 81 MG tablet, Take 81 mg by mouth daily., Disp: , Rfl:    atorvastatin (LIPITOR) 80 MG tablet, TAKE 1 TABLET BY MOUTH  DAILY, Disp: 90 tablet, Rfl: 3   baclofen (LIORESAL) 10 MG tablet, TAKE 1 TABLET BY MOUTH AT  BEDTIME AS NEEDED FOR MUSCLE  SPASM(S), Disp: 90 tablet, Rfl: 1   benzonatate (TESSALON) 100  MG capsule, Take 1 capsule (100 mg total) by mouth every 8 (eight) hours., Disp: 21 capsule, Rfl: 0   carvedilol (COREG) 3.125 MG tablet, TAKE 1 TABLET BY MOUTH  TWICE DAILY WITH A MEAL, Disp: 180 tablet, Rfl: 3   doxycycline (VIBRAMYCIN) 100 MG capsule, Take 1 capsule (100 mg total) by mouth 2 (two) times daily., Disp: 20 capsule, Rfl: 0   fluticasone (FLONASE) 50 MCG/ACT nasal spray, USE 2 SPRAYS IN BOTH  NOSTRILS DAILY, Disp: 48 g, Rfl: 3   gabapentin (NEURONTIN) 100 MG capsule, TAKE 1 CAPSULE BY MOUTH  TWICE DAILY, Disp: 180 capsule, Rfl: 3   glucose blood (ONETOUCH ULTRA) test strip, Use as instructed, Disp: 200 strip, Rfl: 3   guaiFENesin (MUCINEX) 600 MG 12 hr tablet, Take 600 mg by mouth 2 (two) times daily as needed for cough., Disp: , Rfl:    JARDIANCE 10 MG TABS tablet, TAKE 1 TABLET BY MOUTH  DAILY,  Disp: 90 tablet, Rfl: 3   losartan (COZAAR) 50 MG tablet, TAKE 1 TABLET BY MOUTH  DAILY, Disp: 100 tablet, Rfl: 3   metFORMIN (GLUCOPHAGE) 1000 MG tablet, TAKE 1 TABLET BY MOUTH  TWICE DAILY WITH A MEAL, Disp: 180 tablet, Rfl: 3   omeprazole (PRILOSEC) 40 MG capsule, TAKE 1 CAPSULE BY MOUTH  DAILY, Disp: 90 capsule, Rfl: 3   oxyCODONE-acetaminophen (PERCOCET) 10-325 MG tablet, Take 1 tablet by mouth every 4 (four) hours as needed for pain. (Patient taking differently: Take 1 tablet by mouth every 4 (four) hours.), Disp: 10 tablet, Rfl: 0   polyvinyl alcohol (LIQUIFILM TEARS) 1.4 % ophthalmic solution, Place 1 drop into both eyes as needed for dry eyes., Disp: , Rfl:    sildenafil (VIAGRA) 100 MG tablet, Take by mouth., Disp: , Rfl:    tamsulosin (FLOMAX) 0.4 MG CAPS capsule, Take 0.4 mg by mouth daily., Disp: , Rfl:   Current Facility-Administered Medications:    triamcinolone (KENALOG) 0.1 % cream, , Topical, BID, Regal, Tamala Fothergill, DPM  Social History   Tobacco Use  Smoking Status Never  Smokeless Tobacco Never    Allergies  Allergen Reactions   Glimepiride Other (See Comments)    Headache, back pain   Menthol (Topical Analgesic) Rash    Burning rash   Objective:  There were no vitals filed for this visit. There is no height or weight on file to calculate BMI. Constitutional Well developed. Well nourished.  Vascular Dorsalis pedis pulses palpable bilaterally. Posterior tibial pulses palpable bilaterally. Capillary refill normal to all digits.  No cyanosis or clubbing noted. Pedal hair growth normal.  Neurologic Normal speech. Oriented to person, place, and time. Epicritic sensation to light touch grossly present bilaterally.  Dermatologic Nails well groomed and normal in appearance. No open wounds. No skin lesions.  Orthopedic: Right first MTP capsulitis.  Pain on palpation.  Pain with range of motion of the joint.  No deep intra-articular pain noted   Radiographs:  None Assessment:   1. Capsulitis of metatarsophalangeal (MTP) joint of right foot    Plan:  Patient was evaluated and treated and all questions answered.  Right first MTP capsulitis -Explained to patient the etiology of capsulitis was treatment options were discussed.  Given the amount of pain that is any benefit from steroid injection or because of inflammatory component associate with pain.  Patient agrees with plan like to proceed with steroid injection. -A steroid injection was performed at right first MTP using 1% plain Lidocaine and 10 mg of Kenalog.  This was well tolerated.   No follow-ups on file.

## 2022-01-03 ENCOUNTER — Telehealth: Payer: Self-pay

## 2022-01-03 NOTE — Progress Notes (Signed)
Chronic Care Management Pharmacy Assistant   Name: [name]  MRN: [id number] DOB: 1951/03/28  Reason for Encounter: CCM (Appointment Reminder)  Recent office visits:  10/17/21 Robert Bush, MD [facility] Abnormal Labs: "Plz notify urine protein levels were normal. Blood counts, kidneys, liver function returned normal. Cholesterol levels returned normal. A1c was 7.4% so a bit high. Iron levels were normal but iron stores were a bit low - recommend work on iron sources in the diet. If unable to maintain, consider starting oral iron replacement MWF, but would watch for constipation side effect." No med changes. FU 6 months  Recent consult visits:  12/15/21 [name], DPM (Podiatry) Capsulitis of metatarsophalangeal Procedure: 1% plain Lidocaine and 10 mg of Kenalog - Right first MTP capsulitis 11/07/21 Robert Goad, NP [name]) Degeneration of cervical intervertebral disc 08/09/21 [name] (Ophthalmology) Puckering of macula, left eye 07/26/21 Robert Rouge, MD (Cardiology) Chest Pain No med changes. FU 1 year 06/22/21 Robert Broad, MD [name]) Long-term current use of opiate analgesic drug  Hospital visits:  None in previous 6 months  Medications: Outpatient Encounter Medications as of 01/03/2022  Medication Sig   ammonium lactate (AMLACTIN) 12 % cream Apply topically in the morning and at bedtime.   aspirin EC 81 MG tablet Take 81 mg by mouth daily.   atorvastatin (LIPITOR) 80 MG tablet TAKE 1 TABLET BY MOUTH  DAILY   baclofen (LIORESAL) 10 MG tablet TAKE 1 TABLET BY MOUTH AT  BEDTIME AS NEEDED FOR MUSCLE  SPASM(S)   benzonatate (TESSALON) 100 MG capsule Take 1 capsule (100 mg total) by mouth every 8 (eight) hours.   carvedilol (COREG) 3.125 MG tablet TAKE 1 TABLET BY MOUTH  TWICE DAILY WITH A MEAL   doxycycline (VIBRAMYCIN) 100 MG capsule Take 1 capsule (100 mg total) by mouth 2 (two) times daily.   fluticasone (FLONASE) 50 MCG/ACT nasal spray USE 2  SPRAYS IN BOTH  NOSTRILS DAILY   gabapentin (NEURONTIN) 100 MG capsule TAKE 1 CAPSULE BY MOUTH  TWICE DAILY   glucose blood (ONETOUCH ULTRA) test strip Use as instructed   guaiFENesin (MUCINEX) 600 MG 12 hr tablet Take 600 mg by mouth 2 (two) times daily as needed for cough.   JARDIANCE 10 MG TABS tablet TAKE 1 TABLET BY MOUTH  DAILY   losartan (COZAAR) 50 MG tablet TAKE 1 TABLET BY MOUTH  DAILY   metFORMIN (GLUCOPHAGE) 1000 MG tablet TAKE 1 TABLET BY MOUTH  TWICE DAILY WITH A MEAL   omeprazole (PRILOSEC) 40 MG capsule TAKE 1 CAPSULE BY MOUTH  DAILY   oxyCODONE-acetaminophen (PERCOCET) 10-325 MG tablet Take 1 tablet by mouth every 4 (four) hours as needed for pain. (Patient taking differently: Take 1 tablet by mouth every 4 (four) hours.)   polyvinyl alcohol (LIQUIFILM TEARS) 1.4 % ophthalmic solution Place 1 drop into both eyes as needed for dry eyes.   sildenafil (VIAGRA) 100 MG tablet Take by mouth.   tamsulosin (FLOMAX) 0.4 MG CAPS capsule Take 0.4 mg by mouth daily.   Facility-Administered Encounter Medications as of 01/03/2022  Medication   triamcinolone (KENALOG) 0.1 % cream   [name] was contacted to remind of upcoming telephone visit with [name] on 01/08/2022 at 8:45. Patient was reminded to have any blood glucose and blood pressure readings available for review at appointment.   Patient confirmed appointment.  Are you having any problems with your medications? No   Do you have any concerns you like to discuss with the  pharmacist? No  CCM referral has been placed prior to visit?  No   Star Rating Drugs: Medication:  Last Fill: Day Supply Atorvastatin 80 mg 11/27/2021 80 Jardiance 10 mg PAP Losartan 50 mg 11/27/2021 100 Metformin 1000 mg 11/08/2021 100 Fill dates verified with [facility]  [name], CPP notified  [name], [place] Clinical Pharmacy Assistant [phone]

## 2022-01-08 ENCOUNTER — Telehealth: Payer: Medicare Other

## 2022-01-08 NOTE — Progress Notes (Deleted)
Chronic Care Management Pharmacy Note  01/08/2022 Name:  [name] MRN:  [id number] DOB:  November 18, 1950  Summary: CCM F/U visit ***  Recommendations/Changes made from today's visit: ***  Plan: -[facility] will call patient *** -Pharmacist follow up televisit scheduled for *** -PCP appt 04/18/22    Subjective: [name] is an 71 y.o. year old male who is a primary patient of Ria Bush, MD.  The CCM team was consulted for assistance with disease management and care coordination needs.    Engaged with patient by telephone for follow up visit in response to provider referral for pharmacy case management and/or care coordination services.   Consent to Services:  The patient was given information about Chronic Care Management services, agreed to services, and gave verbal consent prior to initiation of services.  Please see initial visit note for detailed documentation.   Patient Care Team: Ria Bush, MD as PCP - General (Family Medicine) Josue Hector, MD as PCP - Cardiology (Cardiology) Tonia Ghent, MD (Family Medicine) Shirley Muscat Loreen Freud, MD as Referring Physician (Optometry) Netta Cedars, MD as Consulting Physician (Orthopedic Surgery) Melina Schools, MD as Consulting Physician (Orthopedic Surgery) Ardis Hughs, MD as Consulting Physician (Urology) Tyler Pita, MD as Consulting Physician (Radiation Oncology) Cira Rue, RN Nurse Navigator as Registered Nurse ([place]) [name], [facility] as Pharmacist (Pharmacist)  Recent office visits: 10/17/21 Ria Bush, MD [facility] Abnormal Labs: "Plz notify urine protein levels were normal. Blood counts, kidneys, liver function returned normal. Cholesterol levels returned normal. A1c was 7.4% so a bit high. Iron levels were normal but iron stores were a bit low - recommend work on iron sources in the diet. If unable to maintain, consider starting oral iron replacement  MWF, but would watch for constipation side effect." No med changes. FU 6 months  Recent consult visits: 12/15/21 [name], DPM (Podiatry) Capsulitis of metatarsophalangeal Procedure: 1% plain Lidocaine and 10 mg of Kenalog - Right first MTP capsulitis 11/07/21 Benedetto Goad, NP [name]) Degeneration of cervical intervertebral disc 08/09/21 Warden Fillers (Ophthalmology) Puckering of macula, left eye 07/26/21 Jenkins Rouge, MD (Cardiology): f/u chest pain, No med changes. FU 1 year 06/22/21 Suella Broad, MD ([facility]) Long-term current use of opiate analgesic drug  Hospital visits: None in previous 6 months   Objective:  Lab Results  Component Value Date   CREATININE 0.90 10/17/2021   BUN 14 10/17/2021   GFR 85.83 10/17/2021   EGFR 88 07/22/2020   GFRNONAA >60 04/13/2021   GFRAA >60 12/13/2019   NA 140 10/17/2021   K 4.2 10/17/2021   CALCIUM 9.3 10/17/2021   CO2 29 10/17/2021   GLUCOSE 108 (H) 10/17/2021    Lab Results  Component Value Date/Time   HGBA1C 7.4 (H) 10/17/2021 11:51 AM   HGBA1C 7.3 (A) 04/18/2021 10:28 AM   HGBA1C 7.4 (H) 10/26/2020 10:27 AM   FRUCTOSAMINE 242 10/16/2018 11:25 AM   GFR 85.83 10/17/2021 11:51 AM   GFR 80.99 10/26/2020 10:27 AM   MICROALBUR 15.3 (H) 10/17/2021 11:53 AM   MICROALBUR 3.2 (H) 06/16/2012 09:23 AM    Last diabetic Eye exam:  Lab Results  Component Value Date/Time   HMDIABEYEEXA No Retinopathy 08/09/2021 12:00 AM    Last diabetic Foot exam:  Lab Results  Component Value Date/Time   HMDIABFOOTEX yes 05/04/2010 12:00 AM     Lab Results  Component Value Date   CHOL 108 10/17/2021   HDL 41.30 10/17/2021   LDLCALC 52 10/17/2021  LDLDIRECT 104 (H) 12/26/2012   TRIG 75.0 10/17/2021   CHOLHDL 3 10/17/2021       Latest Ref Rng & Units 10/17/2021   11:51 AM 04/13/2021    4:08 AM 12/10/2020    1:39 PM  Hepatic Function  Total Protein 6.0 - 8.3 g/dL 7.2  7.4  7.9   Albumin 3.5 - 5.2 g/dL 4.3  3.8  4.4   AST  0 - 37 U/L _0 ALT 0 - 53 U/L _1 Alk Phosphatase 39 - 117 U/L 41  79  48   Total Bilirubin 0.2 - 1.2 mg/dL 0.4  0.4  0.5     Lab Results  Component Value Date/Time   TSH 1.60 10/30/2019 07:53 AM   TSH 1.502 12/26/2012 02:43 PM       Latest Ref Rng & Units 10/17/2021   11:51 AM 04/13/2021    4:08 AM 12/10/2020    1:39 PM  CBC  WBC 4.0 - 10.5 K/uL 5.2  6.8  5.4   Hemoglobin 13.0 - 17.0 g/dL 13.6  13.6  14.2   Hematocrit 39.0 - 52.0 % 41.2  39.2  42.9   Platelets 150.0 - 400.0 K/uL 243.0  259  263     No results found for: "VD25OH"  Clinical ASCVD: Yes  The ASCVD Risk score (Arnett DK, et al., 2019) failed to calculate for the following reasons:   The valid total cholesterol range is 130 to 320 mg/dL       10/17/2021   11:47 AM 11/02/2020   10:03 AM 10/29/2019    9:22 AM  Depression screen PHQ 2/9  Decreased Interest 0 0 0  Down, Depressed, Hopeless 0 0 0  PHQ - 2 Score 0 0 0  Altered sleeping 0 0 0  Tired, decreased energy 0 0 0  Change in appetite 0 0 0  Feeling bad or failure about yourself  0 0 0  Trouble concentrating 0 0 0  Moving slowly or fidgety/restless 0 0 0  Suicidal thoughts 0 0 0  PHQ-9 Score 0 0 0  Difficult doing work/chores  Not difficult at all Not difficult at all     Social History   Tobacco Use  Smoking Status Never  Smokeless Tobacco Never   BP Readings from Last 3 Encounters:  10/17/21 136/80  07/26/21 (!) 110/50  04/18/21 136/72   Pulse Readings from Last 3 Encounters:  10/17/21 79  07/26/21 (!) 49  04/18/21 78   Wt Readings from Last 3 Encounters:  10/17/21 231 lb 2 oz (104.8 kg)  07/26/21 231 lb (104.8 kg)  04/18/21 228 lb 3 oz (103.5 kg)   BMI Readings from Last 3 Encounters:  10/17/21 35.66 kg/m  07/26/21 36.18 kg/m  04/18/21 34.70 kg/m    Assessment/Interventions: Review of patient past medical history, allergies, medications, health status, including review of consultants reports, laboratory and  other test data, was performed as part of comprehensive evaluation and provision of chronic care management services.   SDOH:  (Social Determinants of Health) assessments and interventions performed: {yes/no:20286} SDOH Interventions    Flowsheet Row Chronic Care Management from 09/05/2020 in [place] at [place] Management from 06/07/2020 in [place] at [place] from 10/29/2019 in [place] [facility] [place] from 10/16/2018 in [place] at [place]  SDOH Interventions      Depression Interventions/Treatment  -- -- [id number]  Score <4 Follow-up Not Indicated PHQ2-9 Score <4 Follow-up Not Indicated  Financial Strain Interventions Intervention Not Indicated Other (Comment)  [Mail coupon for sildenafil] -- --      SDOH Screenings   Food Insecurity: No Food Insecurity (10/29/2019)  Housing: Low Risk  (10/29/2019)  Transportation Needs: No Transportation Needs (10/29/2019)  Alcohol Screen: Low Risk  (10/29/2019)  Depression (PHQ2-9): Low Risk  (10/17/2021)  Financial Resource Strain: Low Risk  (09/05/2020)  Recent Concern: Financial Resource Strain - Medium Risk (06/07/2020)  Physical Activity: Inactive (10/29/2019)  Stress: No Stress Concern Present (10/29/2019)  Tobacco Use: Low Risk  (10/17/2021)    CCM Care Plan  Allergies  Allergen Reactions   Glimepiride Other (See Comments)    Headache, back pain   Menthol (Topical Analgesic) Rash    Burning rash    Medications Reviewed Today     Reviewed by Ria Bush, MD (Physician) on 10/17/21 at 1110  Med List Status: <None>   Medication Order Taking? Sig Documenting Provider Last Dose Status Informant  ammonium lactate (AMLACTIN) 12 % cream [id number] Yes Apply topically in the morning and at bedtime. [provider] Taking Active Self  aspirin EC 81 MG tablet [id number] Yes Take 81 mg by mouth daily. Thurnell Lose, MD Taking Active Self   atorvastatin (LIPITOR) 80 MG tablet [id number] Yes TAKE 1 TABLET BY MOUTH  DAILY Ria Bush, MD Taking Active   baclofen (LIORESAL) 10 MG tablet [id number] Yes TAKE 1 TABLET BY MOUTH AT  BEDTIME AS NEEDED FOR MUSCLE  SPASM(S) Ria Bush, MD Taking Active   benzonatate (TESSALON) 100 MG capsule [id number] Yes Take 1 capsule (100 mg total) by mouth every 8 (eight) hours. Truddie Hidden, MD Taking Active   carvedilol (COREG) 3.125 MG tablet [id number] Yes TAKE 1 TABLET BY MOUTH  TWICE DAILY WITH A MEAL Ria Bush, MD Taking Active   doxycycline (VIBRAMYCIN) 100 MG capsule [id number] Yes Take 1 capsule (100 mg total) by mouth 2 (two) times daily. Truddie Hidden, MD Taking Active   fluticasone [facility]) 50 MCG/ACT nasal spray [id number] Yes USE 2 SPRAYS IN BOTH  NOSTRILS DAILY Ria Bush, MD Taking Active   gabapentin (NEURONTIN) 100 MG capsule [id number] Yes TAKE 1 CAPSULE BY MOUTH  TWICE DAILY Ria Bush, MD Taking Active   glucose blood [facility] ULTRA) test strip [id number] Yes Use as instructed Ria Bush, MD Taking Active   guaiFENesin (MUCINEX) 600 MG 12 hr tablet [id number] Yes Take 600 mg by mouth 2 (two) times daily as needed for cough. [provider] Taking Active Self  JARDIANCE 10 MG TABS tablet [id number] Yes TAKE 1 TABLET BY MOUTH  DAILY Ria Bush, MD Taking Active   losartan (COZAAR) 50 MG tablet [id number] Yes TAKE 1 TABLET BY MOUTH  DAILY Ria Bush, MD Taking Active   metFORMIN (GLUCOPHAGE) 1000 MG tablet [id number] Yes TAKE 1 TABLET BY MOUTH  TWICE DAILY WITH A MEAL Ria Bush, MD Taking Active   omeprazole (PRILOSEC) 40 MG capsule [id number] Yes TAKE 1 CAPSULE BY MOUTH  DAILY Ria Bush, MD Taking Active   oxyCODONE-acetaminophen (PERCOCET) 10-325 MG tablet [id number] Yes Take 1 tablet by mouth every 4 (four) hours as needed for pain.  Patient taking differently: Take 1 tablet by mouth every 4 (four) hours.    Ardis Hughs, MD Taking Active   polyvinyl alcohol (LIQUIFILM TEARS) 1.4 % ophthalmic solution [id number] Yes Place 1 drop into both eyes as needed for dry eyes. [provider] Taking  Active Self  sildenafil (VIAGRA) 100 MG tablet [id number] Yes Take by mouth. [provider] Taking Active   tamsulosin (FLOMAX) 0.4 MG CAPS capsule [id number] Yes Take 0.4 mg by mouth daily. [provider] Taking Active   triamcinolone (KENALOG) 0.1 % cream [id number]   Wallene Huh, DPM  Active             Patient Active Problem List   Diagnosis Date Noted   Keratoconjunctivitis sicca not specified as Sjogren's, bilateral 04/21/2021   Osteoarthritis 07/27/2020   Acquired dilation of ascending aorta and aortic root (HCC)    NSAID-induced gastric ulcer 01/07/2019   Pain due to onychomycosis of toenails of both feet 10/15/2018   CAD (coronary artery disease) 09/03/2018   Abnormal stress test    Tongue lesion 08/05/2018   Fatty liver 07/31/2018   Lightheadedness 08/06/2017   Erectile dysfunction 08/01/2015   Health maintenance examination 01/31/2015   Gross hematuria    Lipoma of neck 10/01/2014   Allergic rhinitis 10/01/2014   Diabetic neuropathy ([place]) 05/31/2014   Advanced care planning/counseling discussion 01/26/2014   OSA (obstructive sleep apnea)    Tinea cruris 10/08/2012   Malignant neoplasm of prostate ([place]) 06/16/2012   Severe obesity (BMI 35.0-39.9) with comorbidity ([place]) 01/08/2012   Esophageal dysphagia 10/10/2011   Diverticulosis 10/09/2011   Male sexual dysfunction 09/11/2011   Insomnia 06/19/2011   Shoulder pain 04/08/2011   Neck pain, chronic 02/24/2011   Medicare annual wellness visit, subsequent 10/26/2010   Iron deficiency anemia 07/01/2009   GERD 09/29/2008   Essential hypertension 01/01/2008   ANXIETY DEPRESSION 03/19/2007   TIA (transient ischemic attack) 03/05/2007   Back pain with radiculopathy 12/31/2006   ABDOMINAL WALL HERNIA  10/03/2006   Type 2 diabetes mellitus with other specified complication ([place]) 18/56/3149   History of cocaine use 10/01/2006   Hyperlipidemia associated with type 2 diabetes mellitus ([place]) 08/21/2005    Immunization History  Administered Date(s) Administered   Fluad Quad(high Dose 65+) 02/03/2019, 03/09/2020, 04/18/2021   Influenza Split 02/06/2011, 01/08/2012   Influenza Whole 03/19/2007, 02/19/2008, 01/27/2009, 01/25/2010   Influenza, High Dose Seasonal PF 03/05/2018   Influenza,inj,Quad PF,6+ Mos 02/20/2013, 01/26/2014, 01/31/2015, 02/02/2016, 03/04/2017   Influenza,inj,quad, With Preservative 01/22/2019   PFIZER(Purple Top)SARS-COV-2 Vaccination 08/22/2019, 09/12/2019   Pneumococcal Conjugate-13 08/01/2015   Pneumococcal Polysaccharide-23 06/16/2012, 04/30/2019   Td 04/24/2003   Tdap 01/26/2014   Zoster, Live 12/15/2010    Conditions to be addressed/monitored:  Hypertension, Hyperlipidemia, Diabetes, and Coronary Artery Disease  There are no care plans that you recently modified to display for this patient.    Medication Assistance:  Kristian Covey Cares approved 2023  Compliance/Adherence/Medication fill history: Care Gaps: Foot exam (due 04/29/21)  Star-Rating Drugs: Atorvastatin - PDC 78% - inaccurate; LF 11/27/21 Losartan - PDC 78%- inaccurate; LF 11/27/21 Jardiance - PAP Metformin - PDC 78%- inaccurate; LF 11/08/21  Medication Access: Within the past 30 days, how often has patient missed a dose of medication? *** Is a pillbox or other method used to improve adherence? {YES/NO:21197} Factors that may affect medication adherence? {CHL DESC; BARRIERS:21522} Are meds synced by current pharmacy? {YES/NO:21197} Are meds delivered by current pharmacy? {YES/NO:21197} Does patient experience delays in picking up medications due to transportation concerns? {YES/NO:21197}  [facility] Reviewed: Is patient disadvantaged to use [facility]?: {YES/NO:21197} Current  Rx insurance plan: *** Name and location of Current pharmacy:  [facility] ([place], [place] [address]  [place] [address] Phone: [phone] Fax: [phone]  [facility] [id number] - [place], [place] - [address] [place] [address] Phone: [phone] Fax: [phone]  [facility] ([facility]) - [place], [address] [place] [address] Phone: [phone] Fax: [phone]  [facility] services reviewed with patient today?: {YES/NO:21197} Patient requests to transfer care to [facility]?: {YES/NO:21197} Reason patient declined to change pharmacies: {US patient preference:27474}   Care Plan and Follow Up Patient Decision:  {FOLLOWUP:24991}  Plan: {CM FOLLOW UP NPYY:51102}  ***    Current Barriers:  {pharmacybarriers:24917}  Pharmacist Clinical Goal(s):  Patient will {PHARMACYGOALCHOICES:24921} through collaboration with PharmD and provider.   Interventions: 1:1 collaboration with Ria Bush, MD regarding development and update of comprehensive plan of care as evidenced by provider attestation and co-signature Inter-disciplinary care team collaboration (see longitudinal plan of care) Comprehensive medication review performed; medication list updated in electronic medical record  Hypertension (BP goal <140/90) -Controlled - per home BP reading average -Current home readings: using digital monitor with arm cuff -He feels dizziness and nausea when his HR is low. He is concerned about accuracy of his BP monitor. Reminded him to bring this to next office visit. -Hx mild OSA, off CPAP -Current treatment: Carvedilol 3.19m BID Losartan 50 mg daily -Medications previously tried: none  -Current dietary habits:  Not following a  particular diet but tries to limit salt  -Current exercise habits: None formal, tries to get out and walk at the store, limits sedentary time  -Counseled to continue monitoring BP at home weekly -Recommended to continue current medication  Diabetes (A1c goal <7.5%) -Controlled - A1c 7.4% (improved) 10/26/20, no hypoglycemia this month+ -Current home glucose readings: *** -Denies hypoglycemia  -Current medications: Metformin 1000 mg BID Jardiance 10 mg daily Glipizide 5 mg XL daily AM -Medications previously tried: previously on higher dose glipizide, decrease due to hypoglycemia -Current meal patterns: tries to watch portions/tries to eat balanced plate with baked meats, vegetables - green beans, turnip greens, onion, bell pepper. Limits potatoes/rice.  drinks: diet tea or diet soda, no juices -Current exercise: none formal -Recommended to continue current medication  Hyperlipidemia / CAD (LDL goal < 70) -{US controlled/uncontrolled:25276} - LDL 52 (09/2021) -Hx nonobstructive CAD by cath 2020; hx TIA (cocaine-related) -Current treatment: Atorvastatin 80 mg daily Aspirin 81 mg daily -Medications previously tried: ***  -Current dietary patterns: *** -Current exercise habits: *** -Educated on {CCM HLD Counseling:25126} -{CCMPHARMDINTERVENTION:25122}  Chronic pain (Goal: ***) -{US controlled/uncontrolled:25276} -Hx radiculopathy, osteoarthritis -Follows with Dr RNelva Bush pain mgmt.  -Current treatment  Oxycodone-APAP 10-325 mg #180/month Gabapentin 100 mg BID Baclofen 10 mg PRN -Medications previously tried: ***  -{CCMPHARMDINTERVENTION:25122}  Health Maintenance -Vaccine gaps: Flu, Shingrix -Current therapy:  *** -Educated on {ccm supplement counseling:25128} -{CCM Patient satisfied:25129} -{CCMPHARMDINTERVENTION:25122}  Patient Goals/Self-Care Activities Patient will:  - {pharmacypatientgoals:24919}

## 2022-01-11 ENCOUNTER — Ambulatory Visit: Payer: Medicare Other | Admitting: Pharmacist

## 2022-01-11 DIAGNOSIS — I2510 Atherosclerotic heart disease of native coronary artery without angina pectoris: Secondary | ICD-10-CM

## 2022-01-11 DIAGNOSIS — I10 Essential (primary) hypertension: Secondary | ICD-10-CM

## 2022-01-11 DIAGNOSIS — E1169 Type 2 diabetes mellitus with other specified complication: Secondary | ICD-10-CM

## 2022-01-11 NOTE — Progress Notes (Signed)
Chronic Care Management Pharmacy Note  01/11/2022 Name:  [name] MRN:  [id number] DOB:  28-Nov-1950  Summary: CCM F/U visit -Reviewed medications; pt affirms compliance as prescribed -Pt reports occasional low pulse (<50): today pulse was 38 with accompanying lightheadedness, fatigue but repeat check pulse was 83 and he feels fine  Recommendations/Changes made from today's visit: -Advised pt to monitor pulse daily and call office with pulse < 50 or symptomatic  Plan: -[place] will call patient 1 month for BP/pulse update -PCP appt 04/18/22    Subjective: [name] is an 71 y.o. year old male who is a primary patient of Ria Bush, MD.  The CCM team was consulted for assistance with disease management and care coordination needs.    Engaged with patient by telephone for follow up visit in response to provider referral for pharmacy case management and/or care coordination services.   Consent to Services:  The patient was given information about Chronic Care Management services, agreed to services, and gave verbal consent prior to initiation of services.  Please see initial visit note for detailed documentation.   Patient Care Team: Ria Bush, MD as PCP - General (Family Medicine) Josue Hector, MD as PCP - Cardiology (Cardiology) Tonia Ghent, MD (Family Medicine) Shirley Muscat Loreen Freud, MD as Referring Physician (Optometry) Netta Cedars, MD as Consulting Physician (Orthopedic Surgery) Melina Schools, MD as Consulting Physician (Orthopedic Surgery) Ardis Hughs, MD as Consulting Physician (Urology) Tyler Pita, MD as Consulting Physician (Radiation Oncology) Cira Rue, RN Nurse Navigator as Registered Nurse (Medical Oncology)  Recent office visits: 10/17/21 Ria Bush, MD AWV Abnormal Labs: "Plz notify urine protein levels were normal. Blood counts, kidneys, liver function returned normal. Cholesterol levels  returned normal. A1c was 7.4% so a bit high. Iron levels were normal but iron stores were a bit low - recommend work on iron sources in the diet. If unable to maintain, consider starting oral iron replacement MWF, but would watch for constipation side effect." No med changes. FU 6 months  Recent consult visits: 12/15/21 [name], DPM (Podiatry) Capsulitis of metatarsophalangeal Procedure: 1% plain Lidocaine and 10 mg of Kenalog - Right first MTP capsulitis 11/07/21 Benedetto Goad, NP [name]) Degeneration of cervical intervertebral disc 08/09/21 Warden Fillers (Ophthalmology) Puckering of macula, left eye 07/26/21 Jenkins Rouge, MD (Cardiology): f/u chest pain, No med changes. FU 1 year 06/22/21 Suella Broad, MD ([facility]) Long-term current use of opiate analgesic drug  Hospital visits: None in previous 6 months   Objective:  Lab Results  Component Value Date   CREATININE 0.90 10/17/2021   BUN 14 10/17/2021   GFR 85.83 10/17/2021   EGFR 88 07/22/2020   GFRNONAA >60 04/13/2021   GFRAA >60 12/13/2019   NA 140 10/17/2021   K 4.2 10/17/2021   CALCIUM 9.3 10/17/2021   CO2 29 10/17/2021   GLUCOSE 108 (H) 10/17/2021    Lab Results  Component Value Date/Time   HGBA1C 7.4 (H) 10/17/2021 11:51 AM   HGBA1C 7.3 (A) 04/18/2021 10:28 AM   HGBA1C 7.4 (H) 10/26/2020 10:27 AM   FRUCTOSAMINE 242 10/16/2018 11:25 AM   GFR 85.83 10/17/2021 11:51 AM   GFR 80.99 10/26/2020 10:27 AM   MICROALBUR 15.3 (H) 10/17/2021 11:53 AM   MICROALBUR 3.2 (H) 06/16/2012 09:23 AM    Last diabetic Eye exam:  Lab Results  Component Value Date/Time   HMDIABEYEEXA No Retinopathy 08/09/2021 12:00 AM    Last diabetic Foot exam:  Lab Results  Component  Value Date/Time   HMDIABFOOTEX yes 05/04/2010 12:00 AM     Lab Results  Component Value Date   CHOL 108 10/17/2021   HDL 41.30 10/17/2021   LDLCALC 52 10/17/2021   LDLDIRECT 104 (H) 12/26/2012   TRIG 75.0 10/17/2021   CHOLHDL 3 10/17/2021        Latest Ref Rng & Units 10/17/2021   11:51 AM 04/13/2021    4:08 AM 12/10/2020    1:39 PM  Hepatic Function  Total Protein 6.0 - 8.3 g/dL 7.2  7.4  7.9   Albumin 3.5 - 5.2 g/dL 4.3  3.8  4.4   AST 0 - 37 U/L 10  15  13    ALT 0 - 53 U/L 17  19  19    Alk Phosphatase 39 - 117 U/L 41  79  48   Total Bilirubin 0.2 - 1.2 mg/dL 0.4  0.4  0.5     Lab Results  Component Value Date/Time   TSH 1.60 10/30/2019 07:53 AM   TSH 1.502 12/26/2012 02:43 PM       Latest Ref Rng & Units 10/17/2021   11:51 AM 04/13/2021    4:08 AM 12/10/2020    1:39 PM  CBC  WBC 4.0 - 10.5 K/uL 5.2  6.8  5.4   Hemoglobin 13.0 - 17.0 g/dL 13.6  13.6  14.2   Hematocrit 39.0 - 52.0 % 41.2  39.2  42.9   Platelets 150.0 - 400.0 K/uL 243.0  259  263     No results found for: "VD25OH"  Clinical ASCVD: Yes  The ASCVD Risk score (Arnett DK, et al., 2019) failed to calculate for the following reasons:   The valid total cholesterol range is 130 to 320 mg/dL       10/17/2021   11:47 AM 11/02/2020   10:03 AM 10/29/2019    9:22 AM  Depression screen PHQ 2/9  Decreased Interest 0 0 0  Down, Depressed, Hopeless 0 0 0  PHQ - 2 Score 0 0 0  Altered sleeping 0 0 0  Tired, decreased energy 0 0 0  Change in appetite 0 0 0  Feeling bad or failure about yourself  0 0 0  Trouble concentrating 0 0 0  Moving slowly or fidgety/restless 0 0 0  Suicidal thoughts 0 0 0  PHQ-9 Score 0 0 0  Difficult doing work/chores  Not difficult at all Not difficult at all     Social History   Tobacco Use  Smoking Status Never  Smokeless Tobacco Never   BP Readings from Last 3 Encounters:  10/17/21 136/80  07/26/21 (!) 110/50  04/18/21 136/72   Pulse Readings from Last 3 Encounters:  10/17/21 79  07/26/21 (!) 49  04/18/21 78   Wt Readings from Last 3 Encounters:  10/17/21 231 lb 2 oz (104.8 kg)  07/26/21 231 lb (104.8 kg)  04/18/21 228 lb 3 oz (103.5 kg)   BMI Readings from Last 3 Encounters:  10/17/21 35.66 kg/m   07/26/21 36.18 kg/m  04/18/21 34.70 kg/m    Assessment/Interventions: Review of patient past medical history, allergies, medications, health status, including review of consultants reports, laboratory and other test data, was performed as part of comprehensive evaluation and provision of chronic care management services.   SDOH:  (Social Determinants of Health) assessments and interventions performed: No SDOH Interventions    Flowsheet Row Chronic Care Management from 09/05/2020 in [place] at [place] Management from 06/07/2020 in [place] [facility]  [place] from 10/29/2019 in [place] at [place] from 10/16/2018 in [place] at [place] Interventions      Depression Interventions/Treatment  -- -- HAL9-3 Score <4 Follow-up Not Indicated PHQ2-9 Score <4 Follow-up Not Indicated  Financial Strain Interventions Intervention Not Indicated Other (Comment)  [Mail coupon for sildenafil] -- --      SDOH Screenings   Food Insecurity: No Food Insecurity (10/29/2019)  Housing: Low Risk  (10/29/2019)  Transportation Needs: No Transportation Needs (10/29/2019)  Alcohol Screen: Low Risk  (10/29/2019)  Depression (PHQ2-9): Low Risk  (10/17/2021)  Financial Resource Strain: Low Risk  (09/05/2020)  Recent Concern: Financial Resource Strain - Medium Risk (06/07/2020)  Physical Activity: Inactive (10/29/2019)  Stress: No Stress Concern Present (10/29/2019)  Tobacco Use: Low Risk  (10/17/2021)    [place]  Allergies  Allergen Reactions   Glimepiride Other (See Comments)    Headache, back pain   Menthol (Topical Analgesic) Rash    Burning rash    Medications Reviewed Today     Reviewed by Charlton Haws, [facility] (Pharmacist) on 01/11/22 at 1443  Med List Status: <None>   Medication Order Taking? Sig Documenting Provider Last Dose Status Informant  ammonium lactate (AMLACTIN) 12 % cream [id number] Yes  Apply topically in the morning and at bedtime. [provider] Taking Active Self  aspirin EC 81 MG tablet [id number] Yes Take 81 mg by mouth daily. Thurnell Lose, MD Taking Active Self  atorvastatin (LIPITOR) 80 MG tablet [id number] Yes TAKE 1 TABLET BY MOUTH  DAILY Ria Bush, MD Taking Active   baclofen (LIORESAL) 10 MG tablet [id number] Yes TAKE 1 TABLET BY MOUTH AT  BEDTIME AS NEEDED FOR MUSCLE  SPASM(S) Ria Bush, MD Taking Active   benzonatate (TESSALON) 100 MG capsule [id number] Yes Take 1 capsule (100 mg total) by mouth every 8 (eight) hours. Truddie Hidden, MD Taking Active   carvedilol (COREG) 3.125 MG tablet [id number] Yes TAKE 1 TABLET BY MOUTH  TWICE DAILY WITH A MEAL Ria Bush, MD Taking Active   doxycycline (VIBRAMYCIN) 100 MG capsule [id number] Yes Take 1 capsule (100 mg total) by mouth 2 (two) times daily. Truddie Hidden, MD Taking Active   fluticasone [facility]) 50 MCG/ACT nasal spray [id number] Yes USE 2 SPRAYS IN BOTH  NOSTRILS DAILY Ria Bush, MD Taking Active   gabapentin (NEURONTIN) 100 MG capsule [id number] Yes TAKE 1 CAPSULE BY MOUTH  TWICE DAILY Ria Bush, MD Taking Active   glucose blood [facility]TRA) test strip [id number] Yes Use as instructed Ria Bush, MD Taking Active   guaiFENesin (MUCINEX) 600 MG 12 hr tablet [id number] Yes Take 600 mg by mouth 2 (two) times daily as needed for cough. [provider] Taking Active Self  JARDIANCE 10 MG TABS tablet [id number] Yes TAKE 1 TABLET BY MOUTH  DAILY Ria Bush, MD Taking Active   losartan (COZAAR) 50 MG tablet [id number] Yes TAKE 1 TABLET BY MOUTH  DAILY Ria Bush, MD Taking Active   metFORMIN (GLUCOPHAGE) 1000 MG tablet [id number] Yes TAKE 1 TABLET BY MOUTH  TWICE DAILY WITH A MEAL Ria Bush, MD Taking Active   omeprazole (PRILOSEC) 40 MG capsule [id number] Yes TAKE 1 CAPSULE BY MOUTH  DAILY Ria Bush, MD Taking Active    oxyCODONE-acetaminophen (PERCOCET) 10-325 MG tablet [id number] Yes Take 1 tablet by mouth every 4 (four) hours as needed for pain.  Patient taking differently: Take 1 tablet by mouth every 4 (  four) hours.   Ardis Hughs, MD Taking Active   polyvinyl alcohol (LIQUIFILM TEARS) 1.4 % ophthalmic solution [id number] Yes Place 1 drop into both eyes as needed for dry eyes. [provider] Taking Active Self  sildenafil (VIAGRA) 100 MG tablet [id number] Yes Take by mouth. [provider] Taking Active   tamsulosin (FLOMAX) 0.4 MG CAPS capsule [id number] Yes Take 0.4 mg by mouth daily. [provider] Taking Active   triamcinolone (KENALOG) 0.1 % cream [id number]   Wallene Huh, DPM  Active             Patient Active Problem List   Diagnosis Date Noted   Keratoconjunctivitis sicca not specified as Sjogren's, bilateral 04/21/2021   Osteoarthritis 07/27/2020   Acquired dilation of ascending aorta and aortic root (HCC)    NSAID-induced gastric ulcer 01/07/2019   Pain due to onychomycosis of toenails of both feet 10/15/2018   CAD (coronary artery disease) 09/03/2018   Abnormal stress test    Tongue lesion 08/05/2018   Fatty liver 07/31/2018   Lightheadedness 08/06/2017   Erectile dysfunction 08/01/2015   Health maintenance examination 01/31/2015   Gross hematuria    Lipoma of neck 10/01/2014   Allergic rhinitis 10/01/2014   Diabetic neuropathy ([place]) 05/31/2014   Advanced care planning/counseling discussion 01/26/2014   OSA (obstructive sleep apnea)    Tinea cruris 10/08/2012   Malignant neoplasm of prostate ([place]) 06/16/2012   Severe obesity (BMI 35.0-39.9) with comorbidity ([place]) 01/08/2012   Esophageal dysphagia 10/10/2011   Diverticulosis 10/09/2011   Male sexual dysfunction 09/11/2011   Insomnia 06/19/2011   Shoulder pain 04/08/2011   Neck pain, chronic 02/24/2011   Medicare annual wellness visit, subsequent 10/26/2010   Iron deficiency anemia  07/01/2009   GERD 09/29/2008   Essential hypertension 01/01/2008   ANXIETY DEPRESSION 03/19/2007   TIA (transient ischemic attack) 03/05/2007   Back pain with radiculopathy 12/31/2006   ABDOMINAL WALL HERNIA 10/03/2006   Type 2 diabetes mellitus with other specified complication ([place]) 78/67/6720   History of cocaine use 10/01/2006   Hyperlipidemia associated with type 2 diabetes mellitus ([place]) 08/21/2005    Immunization History  Administered Date(s) Administered   Fluad Quad(high Dose 65+) 02/03/2019, 03/09/2020, 04/18/2021   Influenza Split 02/06/2011, 01/08/2012   Influenza Whole 03/19/2007, 02/19/2008, 01/27/2009, 01/25/2010   Influenza, High Dose Seasonal PF 03/05/2018   Influenza,inj,Quad PF,6+ Mos 02/20/2013, 01/26/2014, 01/31/2015, 02/02/2016, 03/04/2017   Influenza,inj,quad, With Preservative 01/22/2019   PFIZER(Purple Top)SARS-COV-2 Vaccination 08/22/2019, 09/12/2019   Pneumococcal Conjugate-13 08/01/2015   Pneumococcal Polysaccharide-23 06/16/2012, 04/30/2019   Td 04/24/2003   Tdap 01/26/2014   Zoster, Live 12/15/2010    Conditions to be addressed/monitored:  Hypertension, Hyperlipidemia, Diabetes, and Coronary Artery Disease  Care Plan : [place]  Updates made by Charlton Haws, [place] since 01/11/2022 12:00 AM     Problem: Hypertension, Hyperlipidemia, Diabetes and Coronary Artery Disease   Priority: High  Onset Date: 06/07/2020     Long-Range Goal: Disease mgmt   Start Date: 01/11/2022  Expected End Date: 01/12/2023  This Visit's Progress: On track  Priority: High  Note:   Current Barriers:  Low pulse rates  Pharmacist Clinical Goal(s):  Patient will achieve adherence to monitoring guidelines and medication adherence to achieve therapeutic efficacy through collaboration with PharmD and provider.   Interventions: 1:1 collaboration with Ria Bush, MD regarding development and update of comprehensive plan of care as evidenced by  provider attestation and co-signature Inter-disciplinary care team collaboration (see longitudinal plan  of care) Comprehensive medication review performed; medication list updated in electronic medical record  Hypertension (BP goal <140/90) -Query Controlled - pt reports low pulse rates < 50 occasionally with accompanying lightheadedness/fatigue -he reports numbness and tingling in L arm/hand, sometimes R side -Current home readings: today - 145/75, HR 38; repeat 146/81, P 82 -He feels dizziness and nausea when his HR is low. He is concerned about accuracy of his BP monitor. Reminded him to bring this to next office visit. -Hx mild OSA, off CPAP -Current treatment: Carvedilol 3.129m BID --Appropriate, Effective, Query Safe Losartan 50 mg daily --Appropriate, Effective, Safe, Accessible -Medications previously tried: none  -Reviewed low pulse rates, discussed carvedilol can cause this; advised pt to monitor BP/HR at home daily and when symptomatic -Recommended to continue current medication for now; advised pt to call office with pulse rate < 50  Diabetes (A1c goal <7.5%) -Controlled - A1c 7.4% (09/2021) at goal -Current home glucose readings: 9/20 5p 112 9/21 7am 114  12:30 129 -Reports hx of hypoglycemia with higher dose of glipizide, does better with lower dose -Current medications: Metformin 1000 mg BID -Appropriate, Effective, Safe, Accessible Jardiance 10 mg daily (PAP)-Appropriate, Effective, Safe, Accessible Glipizide 5 mg XL daily AM-Appropriate, Effective, Safe, Accessible -Medications previously tried: previously on higher dose glipizide, decrease due to hypoglycemia -Current meal patterns: tries to watch portions/tries to eat balanced plate with baked meats, vegetables - green beans, turnip greens, onion, bell pepper. Limits potatoes/rice.  drinks: diet tea or diet soda, no juices -Current exercise: none formal -Recommended to continue current medication  Hyperlipidemia /  CAD (LDL goal < 70) -Controlled - LDL 52 (09/2021) at goal -Hx nonobstructive CAD by cath 2020; hx TIA (cocaine-related) -Current treatment: Atorvastatin 80 mg daily - Appropriate, Effective, Safe, Accessible Aspirin 81 mg daily -Appropriate, Effective, Safe, Accessible -Medications previously tried: n/a  -Educated on Cholesterol goals;  -Recommended to continue current medication  Chronic pain (Goal: manage pain) -Controlled -Hx radiculopathy, osteoarthritis -Follows with Dr RNelva Bush pain mgmt.  -Current treatment  Oxycodone-APAP 10-325 mg #180/month -Appropriate, Effective, Safe, Accessible Gabapentin 100 mg BID -Appropriate, Effective, Safe, Accessible Baclofen 10 mg PRN -Appropriate, Effective, Safe, Accessible -Medications previously tried: n/a  -Recommended to continue current medication  Health Maintenance -Vaccine gaps: Flu, Shingrix  Patient Goals/Self-Care Activities Patient will:  - take medications as prescribed as evidenced by patient report and record review focus on medication adherence by routine check glucose daily, document, and provide at future appointments check blood pressure dailyu, document, and provide at future appointments       Medication Assistance:  JKristian CoveyCares approved 2023  Compliance/Adherence/Medication fill history: Care Gaps: Foot exam (due 04/29/21)  Star-Rating Drugs: Atorvastatin - PDC 78% - inaccurate; LF 11/27/21 Losartan - PDC 78%- inaccurate; LF 11/27/21 Jardiance - PAP Metformin - PDC 78%- inaccurate; LF 11/08/21  Medication Access: Within the past 30 days, how often has patient missed a dose of medication? 0 Is a pillbox or other method used to improve adherence? Yes  Factors that may affect medication adherence? no barriers identified Are meds synced by current pharmacy? No  Are meds delivered by current pharmacy? Yes  Does patient experience delays in picking up medications due to transportation concerns? No    Upstream Services Reviewed: Is patient disadvantaged to use [facility]?: Yes  Current Rx insurance plan: [facility] [address] [place] [address] Phone: [phone] Fax: [phone]  [facility] services reviewed with patient today?: No  Patient requests to transfer care to [facility]?: No  Reason patient declined to change pharmacies: Disadvantaged due to insurance/mail order   Care Plan and Follow Up Patient Decision:  Patient agrees to Care Plan and Follow-up.  Plan: -Transition CCM to Self Care: Patient achieved CCM goals and no longer needs to be contacted as frequently. The patient has been provided with contact information for the care management team and has been advised to call with any health related questions or concerns.    Charlene Brooke, PharmD, BCACP Clinical Pharmacist [facility] at [facility] [phone]

## 2022-01-11 NOTE — Patient Instructions (Addendum)
Visit Information  Phone number for Pharmacist: [phone]   Goals Addressed   None     Care Plan : [place]  Updates made by Charlton Haws, RPH since 01/11/2022 12:00 AM     Problem: Hypertension, Hyperlipidemia, Diabetes and Coronary Artery Disease   Priority: High  Onset Date: 06/07/2020     Long-Range Goal: Disease mgmt   Start Date: 01/11/2022  Expected End Date: 01/12/2023  This Visit's Progress: On track  Priority: High  Note:   Current Barriers:  Low pulse rates  Pharmacist Clinical Goal(s):  Patient will achieve adherence to monitoring guidelines and medication adherence to achieve therapeutic efficacy through collaboration with PharmD and provider.   Interventions: 1:1 collaboration with Ria Bush, MD regarding development and update of comprehensive plan of care as evidenced by provider attestation and co-signature Inter-disciplinary care team collaboration (see longitudinal plan of care) Comprehensive medication review performed; medication list updated in electronic medical record  Hypertension (BP goal <140/90) -Query Controlled - pt reports low pulse rates < 50 occasionally with accompanying lightheadedness/fatigue -he reports numbness and tingling in L arm/hand, sometimes R side -Current home readings: today - 145/75, HR 38; repeat 146/81, P 82 -He feels dizziness and nausea when his HR is low. He is concerned about accuracy of his BP monitor. Reminded him to bring this to next office visit. -Hx mild OSA, off CPAP -Current treatment: Carvedilol 3.'125mg'$  BID --Appropriate, Effective, Query Safe Losartan 50 mg daily --Appropriate, Effective, Safe, Accessible -Medications previously tried: none  -Reviewed low pulse rates, discussed carvedilol can cause this; advised pt to monitor BP/HR at home daily and when symptomatic -Recommended to continue current medication for now; advised pt to call office with pulse rate < 50  Diabetes  (A1c goal <7.5%) -Controlled - A1c 7.4% (09/2021) at goal -Current home glucose readings: 9/20 5p 112 9/21 7am 114  12:30 129 -Reports hx of hypoglycemia with higher dose of glipizide, does better with lower dose -Current medications: Metformin 1000 mg BID -Appropriate, Effective, Safe, Accessible Jardiance 10 mg daily (PAP)-Appropriate, Effective, Safe, Accessible Glipizide 5 mg XL daily AM-Appropriate, Effective, Safe, Accessible -Medications previously tried: previously on higher dose glipizide, decrease due to hypoglycemia -Current meal patterns: tries to watch portions/tries to eat balanced plate with baked meats, vegetables - green beans, turnip greens, onion, bell pepper. Limits potatoes/rice.  drinks: diet tea or diet soda, no juices -Current exercise: none formal -Recommended to continue current medication  Hyperlipidemia / CAD (LDL goal < 70) -Controlled - LDL 52 (09/2021) at goal -Hx nonobstructive CAD by cath 2020; hx TIA (cocaine-related) -Current treatment: Atorvastatin 80 mg daily - Appropriate, Effective, Safe, Accessible Aspirin 81 mg daily -Appropriate, Effective, Safe, Accessible -Medications previously tried: n/a  -Educated on Cholesterol goals;  -Recommended to continue current medication  Chronic pain (Goal: manage pain) -Controlled -Hx radiculopathy, osteoarthritis -Follows with Dr [name], pain mgmt.  -Current treatment  Oxycodone-APAP 10-325 mg #180/month -Appropriate, Effective, Safe, Accessible Gabapentin 100 mg BID -Appropriate, Effective, Safe, Accessible Baclofen 10 mg PRN -Appropriate, Effective, Safe, Accessible -Medications previously tried: n/a  -Recommended to continue current medication  Health Maintenance -Vaccine gaps: Flu, Shingrix  Patient Goals/Self-Care Activities Patient will:  - take medications as prescribed as evidenced by patient report and record review focus on medication adherence by routine check glucose daily, document, and  provide at future appointments check blood pressure dailyu, document, and provide at future appointments       The patient verbalized understanding of instructions, educational materials,  and care plan provided today and DECLINED offer to receive copy of patient instructions, educational materials, and care plan.  The patient has been provided with contact information for the care management team and has been advised to call with any health related questions or concerns.    Charlene Brooke, PharmD, BCACP Clinical Pharmacist [facility] at [facility] [phone]

## 2022-01-21 ENCOUNTER — Other Ambulatory Visit: Payer: Self-pay | Admitting: Family Medicine

## 2022-01-21 DIAGNOSIS — E1169 Type 2 diabetes mellitus with other specified complication: Secondary | ICD-10-CM

## 2022-01-21 DIAGNOSIS — K219 Gastro-esophageal reflux disease without esophagitis: Secondary | ICD-10-CM

## 2022-01-21 DIAGNOSIS — I10 Essential (primary) hypertension: Secondary | ICD-10-CM

## 2022-01-22 NOTE — Telephone Encounter (Signed)
Gabapentin Last filled:  11/27/21., #180 Last OV:  10/17/21, AWV Next OV:  04/18/22, 6 mo DM f/u

## 2022-02-01 ENCOUNTER — Other Ambulatory Visit: Payer: Self-pay | Admitting: Family Medicine

## 2022-02-02 NOTE — Telephone Encounter (Signed)
Message from pharmacy:  Please send a replace/new response with 100-Day Supply if appropriate to maximize member benefit. Requesting 1 year supply.  Baclofen Last filled:  11/07/21, #90 Last OV:  10/17/21, AWV Next OV:  04/18/22, 6 mo DM f/u

## 2022-02-05 NOTE — Telephone Encounter (Signed)
ERx 

## 2022-02-06 DIAGNOSIS — H0102B Squamous blepharitis left eye, upper and lower eyelids: Secondary | ICD-10-CM | POA: Diagnosis not present

## 2022-02-06 DIAGNOSIS — E119 Type 2 diabetes mellitus without complications: Secondary | ICD-10-CM | POA: Diagnosis not present

## 2022-02-06 DIAGNOSIS — Z961 Presence of intraocular lens: Secondary | ICD-10-CM | POA: Diagnosis not present

## 2022-02-06 DIAGNOSIS — H353131 Nonexudative age-related macular degeneration, bilateral, early dry stage: Secondary | ICD-10-CM | POA: Diagnosis not present

## 2022-02-06 DIAGNOSIS — H1045 Other chronic allergic conjunctivitis: Secondary | ICD-10-CM | POA: Diagnosis not present

## 2022-02-06 DIAGNOSIS — H35372 Puckering of macula, left eye: Secondary | ICD-10-CM | POA: Diagnosis not present

## 2022-02-06 DIAGNOSIS — H0102A Squamous blepharitis right eye, upper and lower eyelids: Secondary | ICD-10-CM | POA: Diagnosis not present

## 2022-02-06 DIAGNOSIS — H40013 Open angle with borderline findings, low risk, bilateral: Secondary | ICD-10-CM | POA: Diagnosis not present

## 2022-02-07 ENCOUNTER — Telehealth: Payer: Self-pay

## 2022-02-07 NOTE — Progress Notes (Signed)
Chronic Care Management Pharmacy Assistant   Name: [name]  MRN: [id number] DOB: 10/02/1950  Reason for Encounter: CCM (Hypertension Disease State)  Recent office visits:  None since last CCM contact  Recent consult visits:  None since last CCM contact  Hospital visits:  None in previous 6 months  Medications: Outpatient Encounter Medications as of 02/07/2022  Medication Sig   ammonium lactate (AMLACTIN) 12 % cream Apply topically in the morning and at bedtime.   aspirin EC 81 MG tablet Take 81 mg by mouth daily.   atorvastatin (LIPITOR) 80 MG tablet TAKE 1 TABLET BY MOUTH ONCE DAILY   baclofen (LIORESAL) 10 MG tablet TAKE 1 TABLET BY MOUTH AT  BEDTIME AS NEEDED FOR MUSCLE  SPASM(S)   benzonatate (TESSALON) 100 MG capsule Take 1 capsule (100 mg total) by mouth every 8 (eight) hours.   carvedilol (COREG) 3.125 MG tablet TAKE 1 TABLET BY MOUTH  TWICE DAILY WITH A MEAL   doxycycline (VIBRAMYCIN) 100 MG capsule Take 1 capsule (100 mg total) by mouth 2 (two) times daily.   fluticasone (FLONASE) 50 MCG/ACT nasal spray USE 2 SPRAYS IN BOTH  NOSTRILS DAILY   gabapentin (NEURONTIN) 100 MG capsule TAKE 1 CAPSULE BY MOUTH  TWICE DAILY   glucose blood (ONETOUCH ULTRA) test strip Use as instructed   guaiFENesin (MUCINEX) 600 MG 12 hr tablet Take 600 mg by mouth 2 (two) times daily as needed for cough.   JARDIANCE 10 MG TABS tablet TAKE 1 TABLET BY MOUTH  DAILY   losartan (COZAAR) 50 MG tablet TAKE 1 TABLET BY MOUTH  DAILY   metFORMIN (GLUCOPHAGE) 1000 MG tablet TAKE 1 TABLET BY MOUTH  TWICE DAILY WITH A MEAL   omeprazole (PRILOSEC) 40 MG capsule TAKE 1 CAPSULE BY MOUTH  DAILY   oxyCODONE-acetaminophen (PERCOCET) 10-325 MG tablet Take 1 tablet by mouth every 4 (four) hours as needed for pain. (Patient taking differently: Take 1 tablet by mouth every 4 (four) hours.)   polyvinyl alcohol (LIQUIFILM TEARS) 1.4 % ophthalmic solution Place 1 drop into both eyes as needed for dry eyes.    sildenafil (VIAGRA) 100 MG tablet Take by mouth.   tamsulosin (FLOMAX) 0.4 MG CAPS capsule Take 0.4 mg by mouth daily.   Facility-Administered Encounter Medications as of 02/07/2022  Medication   triamcinolone (KENALOG) 0.1 % cream    Recent Office Vitals: BP Readings from Last 3 Encounters:  10/17/21 136/80  07/26/21 (!) 110/50  04/18/21 136/72   Pulse Readings from Last 3 Encounters:  10/17/21 79  07/26/21 (!) 49  04/18/21 78    Wt Readings from Last 3 Encounters:  10/17/21 231 lb 2 oz (104.8 kg)  07/26/21 231 lb (104.8 kg)  04/18/21 228 lb 3 oz (103.5 kg)     Kidney Function Lab Results  Component Value Date/Time   CREATININE 0.90 10/17/2021 11:51 AM   CREATININE 1.09 04/13/2021 04:08 AM   CREATININE 0.99 12/26/2012 02:43 PM   GFR 85.83 10/17/2021 11:51 AM   GFRNONAA >60 04/13/2021 04:08 AM   GFRAA >60 12/13/2019 11:40 AM      Latest Ref Rng & Units 10/17/2021   11:51 AM 04/13/2021    4:08 AM 12/10/2020    1:39 PM  BMP  Glucose 70 - 99 mg/dL 108  259  102   BUN 6 - 23 mg/dL '14  16  10   '$ Creatinine 0.40 - 1.50 mg/dL 0.90  1.09  0.82   Sodium 135 - 145  mEq/L 140  132  137   Potassium 3.5 - 5.1 mEq/L 4.2  3.1  3.8   Chloride 96 - 112 mEq/L 103  100  102   CO2 19 - 32 mEq/L '29  23  26   '$ Calcium 8.4 - 10.5 mg/dL 9.3  8.4  9.0    Attempted contact with patient 3 times. Unsuccessful outreach. Will atttempt contact next month.  Current antihypertensive regimen:  Carvedilol 3.'125mg'$  BID  Losartan 50 mg daily    Adherence Review: Is the patient currently on ACE/ARB medication? Yes Does the patient have >5 day gap between last estimated fill dates? No  Summary of recommendations from last [place] visit (Date:01/11/2022)  Summary: CCM F/U visit -Reviewed medications; pt affirms compliance as prescribed -Pt reports occasional low pulse (<50): today pulse was 38 with accompanying lightheadedness, fatigue but repeat check pulse was 83 and he feels fine    Recommendations/Changes made from today's visit: -Advised pt to monitor pulse daily and call office with pulse < 50 or symptomatic   Plan: -[place] will call patient 1 month for BP/pulse update -PCP appt 04/18/22  Star Rating Drugs:  Medication:  Last Fill: Day Supply Atorvastatin 80 mg 11/27/21 80 Jardiance 10 mg PAP Losartan 50 mg 11/27/21 100 Metformin 1000 mg 11/08/21 100  Care Gaps: Annual wellness visit in last year? Yes 10/17/21 Most Recent BP reading: 136/80 on 10/17/2021  If Diabetic: Most recent A1C reading: 7.4 on 10/17/2021 Last eye exam / retinopathy screening: Up to date Last diabetic foot exam: 04/29/2020  Upcoming appointments: PCP appointment on 04/18/2022 for 6 month fu for DM  Charlene Brooke, CPP notified  Marijean Niemann, [place] Assistant [phone]

## 2022-02-21 DIAGNOSIS — M5030 Other cervical disc degeneration, unspecified cervical region: Secondary | ICD-10-CM | POA: Diagnosis not present

## 2022-02-21 DIAGNOSIS — Z79891 Long term (current) use of opiate analgesic: Secondary | ICD-10-CM | POA: Diagnosis not present

## 2022-02-21 DIAGNOSIS — Z5181 Encounter for therapeutic drug level monitoring: Secondary | ICD-10-CM | POA: Diagnosis not present

## 2022-02-21 DIAGNOSIS — M961 Postlaminectomy syndrome, not elsewhere classified: Secondary | ICD-10-CM | POA: Diagnosis not present

## 2022-03-08 ENCOUNTER — Telehealth: Payer: Self-pay | Admitting: Family Medicine

## 2022-03-08 DIAGNOSIS — E1169 Type 2 diabetes mellitus with other specified complication: Secondary | ICD-10-CM

## 2022-03-08 MED ORDER — EMPAGLIFLOZIN 10 MG PO TABS: 10.0000 mg | ORAL_TABLET | Freq: Every day | ORAL | 3 refills | Status: DC

## 2022-03-08 NOTE — Telephone Encounter (Signed)
Patient called in and stated that the pharmacy needs a new prescription for his JARDIANCE 10 MG TABS tablet . There number is [phone]. Thank you!

## 2022-03-08 NOTE — Telephone Encounter (Signed)
E-scribed refill 

## 2022-03-09 ENCOUNTER — Other Ambulatory Visit: Payer: Self-pay

## 2022-03-09 DIAGNOSIS — J309 Allergic rhinitis, unspecified: Secondary | ICD-10-CM

## 2022-03-09 MED ORDER — SILDENAFIL CITRATE 100 MG PO TABS: 50.0000 mg | ORAL_TABLET | ORAL | 1 refills | Status: AC | PRN

## 2022-03-09 MED ORDER — FLUTICASONE PROPIONATE 50 MCG/ACT NA SUSP: NASAL | 2 refills | Status: DC

## 2022-03-09 NOTE — Telephone Encounter (Signed)
Sildenafil Last OV:  10/17/21, AWV Next OV:  04/18/22, 6 mo DM f/u

## 2022-03-09 NOTE — Telephone Encounter (Signed)
[name], is pt on [facility] PAP for Jardiance 10 mg?

## 2022-03-09 NOTE — Telephone Encounter (Signed)
ERx 

## 2022-03-09 NOTE — Telephone Encounter (Signed)
Pt called back asking for status of med refill? Told pt the prescription was sent in [facility] delivery yesterday, 03/08/22. Pt stated the refill was suppose to be sent to a [place] Patient Assistance Program. Pt requested a call back from [place] @ [phone]

## 2022-03-09 NOTE — Telephone Encounter (Signed)
Yes he gets Jardiance from [facility]. My team deals with them a lot so I'll have them reach out to coordinate refill.  Amy - can you call [facility] and relay the refill information that [facility] sent in.

## 2022-03-09 NOTE — Telephone Encounter (Signed)
Noted.  Spoke with [facility] to c/x rx, however, it has already been shipped.  I was instructed to tell pt DO NOT open package when he receives it.  Pt needs to call either [phone] or [phone] to request a return kit to send package back to [facility].   Spoke with pt relaying info above.  Pt verbalizes understanding and will call them now to have return kit arrive shortly after package.

## 2022-03-12 NOTE — Telephone Encounter (Signed)
I called [facility] and had to leave a message for them to return my call. I called to advise patient. Patient stated he would call [facility] to automatically request his refill as he has been doing in the past.  Charlene Brooke, CPP notified  Marijean Niemann, [place] Pharmacy Assistant [phone]

## 2022-03-20 DIAGNOSIS — N471 Phimosis: Secondary | ICD-10-CM | POA: Diagnosis not present

## 2022-03-26 NOTE — Telephone Encounter (Signed)
Received fax from [facility] (dated 03/22/22) saying the prescription they have on file is expired. Will call their pharmacy [phone]) to call in new Rx;  Jardiance 10 mg #90 with 3 RF. Take 1 tablet daily.

## 2022-03-26 NOTE — Telephone Encounter (Signed)
Verbal Rx has been called in to [facility].  Charlene Brooke, CPP notified  Marijean Niemann, [place] Clinical Pharmacy Assistant [phone]

## 2022-03-27 NOTE — Telephone Encounter (Signed)
Patient called in and stated that [facility] still haven't received the prescription yet for this medication. He stated he is completely out and needs the medication as soon as possible. Thank you!

## 2022-03-28 NOTE — Telephone Encounter (Signed)
Spoke with [facility]; they do have the updated/new prescription on file. [facility] stated he will receive the medication in 7 - 10 days. In order for the medication to be expedited patient needs to call [facility]. I called and spoke with patient and informed him. Patient stated he is going to call [facility] to expedite his delivery. I advised patient to reach out to me if he needs anything else.   Charlene Brooke, CPP notified  Marijean Niemann, [place] Clinical Pharmacy Assistant [phone]

## 2022-04-02 ENCOUNTER — Telehealth: Payer: Self-pay

## 2022-04-02 NOTE — Progress Notes (Cosign Needed)
    Chronic Care Management Pharmacy Assistant   Name: [name]  MRN: [id number] DOB: 1950-07-18  Patient assistance forms for Jardiance with [facility] for 2024 have been completed and uploaded.   Al Corpus, CPP notified  Claudina Lick, [place] Clinical Pharmacy Assistant [phone]

## 2022-04-03 NOTE — Telephone Encounter (Signed)
Printed forms and placed in front office for patient signature.

## 2022-04-04 NOTE — Telephone Encounter (Signed)
Called patient; has been informed. He will come by Friday (04/06/2022).  Charlene Brooke, CPP notified  Marijean Niemann, [place] Clinical Pharmacy Assistant [phone]

## 2022-04-06 NOTE — Telephone Encounter (Signed)
Faxed completed application to [facility].

## 2022-04-10 NOTE — Telephone Encounter (Signed)
Received fax from [facility]. [name] is approved 04/23/22 - 04/23/23.

## 2022-04-18 ENCOUNTER — Ambulatory Visit (INDEPENDENT_AMBULATORY_CARE_PROVIDER_SITE_OTHER): Payer: Medicare Other | Admitting: Family Medicine

## 2022-04-18 ENCOUNTER — Encounter: Payer: Self-pay | Admitting: Family Medicine

## 2022-04-18 VITALS — BP 133/82 | HR 68 | Temp 97.7°F | Ht 67.5 in | Wt 233.2 lb

## 2022-04-18 DIAGNOSIS — Z23 Encounter for immunization: Secondary | ICD-10-CM | POA: Diagnosis not present

## 2022-04-18 DIAGNOSIS — I10 Essential (primary) hypertension: Secondary | ICD-10-CM

## 2022-04-18 DIAGNOSIS — E1169 Type 2 diabetes mellitus with other specified complication: Secondary | ICD-10-CM | POA: Diagnosis not present

## 2022-04-18 LAB — POCT GLYCOSYLATED HEMOGLOBIN (HGB A1C): Hemoglobin A1C: 7.4 % — AB (ref 4.0–5.6)

## 2022-04-18 NOTE — Patient Instructions (Addendum)
Flu shot today  You are doing well today. Continue current medicines.  Work on diabetic diet - low sugar low carbs - over the next 6 months. Return in 6 months for wellness visit/physical.

## 2022-04-18 NOTE — Assessment & Plan Note (Addendum)
Chronic. Declines DSME at this time, but may be interested in the future.  Feels jardiance has been helpful.  Continue metformin and jardiance. Discussed increased jardiance dose, hesitant due to possible increased side effects. Will focus efforts on low sugar low carb diabetic diet over next few months, reassess control at next visit.

## 2022-04-18 NOTE — Progress Notes (Signed)
Patient ID: [name], male    DOB: April 15, 1951, 71 y.o.   MRN: [id number]  This visit was conducted in person.  BP 133/82 (BP Location: Right Arm, Cuff Size: Large)   Pulse 68   Temp 97.7 F (36.5 C) (Temporal)   Ht 5' 7.5" (1.715 m)   Wt 233 lb 3.2 oz (105.8 kg)   SpO2 97%   BMI 35.99 kg/m   BP Readings from Last 3 Encounters:  04/18/22 133/82  10/17/21 136/80  07/26/21 (!) 110/50  On recheck with home cuff: 147/89, HR 66  CC: 6 mo DM f/u visit  Subjective:   HPI: [name] is a 71 y.o. male presenting on 04/18/2022 for Diabetes (Here for 6 mo f/u. Also, pt brought in home BP monitor to compare. Reading in office today- 166/131.)   Sees cardiology yearly for known aortic root dilation to 4.3cm with EF 55-60%, tricuspid AV with mild AR, s/p normal catheterization 08/2018.   HTN - Compliant with current antihypertensive regimen of carvedilol 3.'125mg'$  bid, losartan '50mg'$  daily. Does check blood pressures at home: marked fluctuations even today from 97-168/46-82, HR 30-80s. No low blood pressure readings or symptoms of syncope. Denies HA, vision changes, CP/tightness, SOB, leg swelling. Notes dizziness when waking up in the mornings  DM - does regularly check sugars fasting 130-170s. Compliant with antihyperglycemic regimen which includes: metformin '1000mg'$  bid, jardiance '10mg'$  daily. Was off jardiance for 2 wks, now back on it. Now off glipizide due to hypoglycemia. Denies low sugars or hypoglycemic symptoms. Denies paresthesias, blurry vision. Last diabetic eye exam 07/2021. Glucometer brand: one touch ultra. Last foot exam: 04/2020 - DUE. DSME: would be interested - requests deferred for now.  Lab Results  Component Value Date   HGBA1C 7.4 (A) 04/18/2022   Diabetic Foot Exam - Simple   No data filed    Lab Results  Component Value Date   MICROALBUR 15.3 (H) 10/17/2021       Relevant past medical, surgical, family and social history reviewed and updated as  indicated. Interim medical history since our last visit reviewed. Allergies and medications reviewed and updated. Outpatient Medications Prior to Visit  Medication Sig Dispense Refill   ammonium lactate (AMLACTIN) 12 % cream Apply topically in the morning and at bedtime.     aspirin EC 81 MG tablet Take 81 mg by mouth daily.     atorvastatin (LIPITOR) 80 MG tablet TAKE 1 TABLET BY MOUTH ONCE DAILY 90 tablet 3   baclofen (LIORESAL) 10 MG tablet TAKE 1 TABLET BY MOUTH AT  BEDTIME AS NEEDED FOR MUSCLE  SPASM(S) 100 tablet 1   benzonatate (TESSALON) 100 MG capsule Take 1 capsule (100 mg total) by mouth every 8 (eight) hours. 21 capsule 0   carvedilol (COREG) 3.125 MG tablet TAKE 1 TABLET BY MOUTH  TWICE DAILY WITH A MEAL 180 tablet 3   clobetasol cream (TEMOVATE) 0.05 % SMARTSIG:1 Topical Daily     doxycycline (VIBRAMYCIN) 100 MG capsule Take 1 capsule (100 mg total) by mouth 2 (two) times daily. 20 capsule 0   empagliflozin (JARDIANCE) 10 MG TABS tablet Take 1 tablet (10 mg total) by mouth daily. 90 tablet 3   fluticasone (FLONASE) 50 MCG/ACT nasal spray USE 2 SPRAYS IN BOTH  NOSTRILS DAILY 48 g 2   gabapentin (NEURONTIN) 100 MG capsule TAKE 1 CAPSULE BY MOUTH  TWICE DAILY 160 capsule 3   glucose blood (ONETOUCH ULTRA) test strip Use as instructed 200 strip  3   guaiFENesin (MUCINEX) 600 MG 12 hr tablet Take 600 mg by mouth 2 (two) times daily as needed for cough.     losartan (COZAAR) 50 MG tablet TAKE 1 TABLET BY MOUTH  DAILY 100 tablet 3   metFORMIN (GLUCOPHAGE) 1000 MG tablet TAKE 1 TABLET BY MOUTH  TWICE DAILY WITH A MEAL 180 tablet 3   omeprazole (PRILOSEC) 40 MG capsule TAKE 1 CAPSULE BY MOUTH  DAILY 90 capsule 3   oxyCODONE-acetaminophen (PERCOCET) 10-325 MG tablet Take 1 tablet by mouth every 4 (four) hours as needed for pain. (Patient taking differently: Take 1 tablet by mouth every 4 (four) hours.) 10 tablet 0   polyvinyl alcohol (LIQUIFILM TEARS) 1.4 % ophthalmic solution Place 1 drop  into both eyes as needed for dry eyes.     sildenafil (VIAGRA) 100 MG tablet Take 0.5-1 tablets (50-100 mg total) by mouth as needed for erectile dysfunction. 10 tablet 1   tamsulosin (FLOMAX) 0.4 MG CAPS capsule Take 0.4 mg by mouth daily.     Facility-Administered Medications Prior to Visit  Medication Dose Route Frequency Provider Last Rate Last Admin   triamcinolone (KENALOG) 0.1 % cream   Topical BID Wallene Huh, DPM         Per HPI unless specifically indicated in ROS section below Review of Systems  Objective:  BP 133/82 (BP Location: Right Arm, Cuff Size: Large)   Pulse 68   Temp 97.7 F (36.5 C) (Temporal)   Ht 5' 7.5" (1.715 m)   Wt 233 lb 3.2 oz (105.8 kg)   SpO2 97%   BMI 35.99 kg/m   Wt Readings from Last 3 Encounters:  04/18/22 233 lb 3.2 oz (105.8 kg)  10/17/21 231 lb 2 oz (104.8 kg)  07/26/21 231 lb (104.8 kg)      Physical Exam Vitals and nursing note reviewed.  Constitutional:      Appearance: Normal appearance. He is not ill-appearing.  Cardiovascular:     Rate and Rhythm: Normal rate and regular rhythm.     Pulses: Normal pulses.     Heart sounds: Normal heart sounds. No murmur heard. Pulmonary:     Effort: Pulmonary effort is normal. No respiratory distress.     Breath sounds: Normal breath sounds. No wheezing, rhonchi or rales.  Musculoskeletal:     Right lower leg: No edema.     Left lower leg: No edema.  Skin:    General: Skin is warm and dry.     Findings: No rash.  Neurological:     Mental Status: He is alert.  Psychiatric:        Mood and Affect: Mood normal.        Behavior: Behavior normal.       Results for orders placed or performed in visit on 04/18/22  POCT glycosylated hemoglobin (Hb A1C)  Result Value Ref Range   Hemoglobin A1C 7.4 (A) 4.0 - 5.6 %   HbA1c POC (<> result, manual entry)     HbA1c, POC (prediabetic range)     HbA1c, POC (controlled diabetic range)      Assessment & Plan:   Problem List Items Addressed  This Visit     Type 2 diabetes mellitus with other specified complication ([place]) - Primary    Chronic. Declines DSME at this time, but may be interested in the future.  Feels jardiance has been helpful.  Continue metformin and jardiance. Discussed increased jardiance dose, hesitant due to possible increased side effects.  Will focus efforts on low sugar low carb diabetic diet over next few months, reassess control at next visit.       Relevant Orders   POCT glycosylated hemoglobin (Hb A1C) (Completed)   Essential hypertension    Chronic, initially elevated readings in office however on recheck BP remains well controlled on current regimen - continue this. Given home reading fluctuations, advised likely needs new BP cuff.       Other Visit Diagnoses     Need for influenza vaccination       Relevant Orders   Flu Vaccine QUAD High Dose(Fluad) (Completed)        No orders of the defined types were placed in this encounter.  Orders Placed This Encounter  Procedures   Flu Vaccine QUAD High Dose(Fluad)   POCT glycosylated hemoglobin (Hb A1C)    Patient Instructions  Flu shot today  You are doing well today. Continue current medicines.  Work on diabetic diet - low sugar low carbs - over the next 6 months. Return in 6 months for wellness visit/physical.   Follow up plan: Return in about 6 months (around 10/18/2022) for medicare wellness visit, annual exam, prior fasting for blood work.  Ria Bush, MD

## 2022-04-18 NOTE — Assessment & Plan Note (Addendum)
Chronic, initially elevated readings in office however on recheck BP remains well controlled on current regimen - continue this. Given home reading fluctuations, advised likely needs new BP cuff.

## 2022-05-03 ENCOUNTER — Ambulatory Visit: Payer: Medicare Other | Admitting: Podiatry

## 2022-05-03 VITALS — BP 130/78

## 2022-05-03 DIAGNOSIS — B351 Tinea unguium: Secondary | ICD-10-CM | POA: Diagnosis not present

## 2022-05-03 DIAGNOSIS — M79675 Pain in left toe(s): Secondary | ICD-10-CM | POA: Diagnosis not present

## 2022-05-03 DIAGNOSIS — M79674 Pain in right toe(s): Secondary | ICD-10-CM | POA: Diagnosis not present

## 2022-05-03 NOTE — Progress Notes (Signed)
  Subjective:  Patient ID: [name], male    DOB: 1950-12-21,  MRN: [id number]  Chief Complaint  Patient presents with   Nail Problem    Nail trim    72 y.o. male returns for the above complaint.  Patient presents with thickened elongated dystrophic toenails x10.  Patient states is painful to touch.  Patient would like to have them debrided down.  He denies any other acute complaints.  He is having hard time to debride his own toenails.  Objective:   Vitals:   05/03/22 0943  BP: 130/78   Podiatric Exam: Vascular: dorsalis pedis and posterior tibial pulses are palpable bilateral. Capillary return is immediate. Temperature gradient is WNL. Skin turgor WNL  Sensorium: Normal Semmes Weinstein monofilament test. Normal tactile sensation bilaterally. Nail Exam: Pt has thick disfigured discolored nails with subungual debris noted bilateral entire nail hallux through fifth toenails.  Pain on palpation to the nails. Ulcer Exam: There is no evidence of ulcer or pre-ulcerative changes or infection. Orthopedic Exam: Muscle tone and strength are WNL. No limitations in general ROM. No crepitus or effusions noted. HAV  B/L.  Hammer toes 2-5  B/L. Skin: No Porokeratosis. No infection or ulcers.  Xerosis improving.Marland Kitchen  No fissuring noted no clinical signs of infection noted    Assessment & Plan:   No diagnosis found.     Patient was evaluated and treated and all questions answered.  Xerosis bilateral lower extremity -Clinically improving.  I have asked him to continue applying ammonium lactate lotion twice a day.  He states understanding.  Onychomycosis with pain  -Nails palliatively debrided as below. -Educated on self-care  Procedure: Nail Debridement Rationale: pain  Type of Debridement: manual, sharp debridement. Instrumentation: Nail nipper, rotary burr. Number of Nails: 10  Procedures and Treatment: Consent by patient was obtained for treatment procedures. The patient  understood the discussion of treatment and procedures well. All questions were answered thoroughly reviewed. Debridement of mycotic and hypertrophic toenails, 1 through 5 bilateral and clearing of subungual debris. No ulceration, no infection noted.  Return Visit-Office Procedure: Patient instructed to return to the office for a follow up visit 3 months for continued evaluation and treatment.  [name], DPM    No follow-ups on file.

## 2022-05-14 ENCOUNTER — Other Ambulatory Visit: Payer: Self-pay | Admitting: Family Medicine

## 2022-07-17 NOTE — Progress Notes (Signed)
CARDIOLOGY CONSULT NOTE       Patient ID: [name] MRN: [id number] DOB/AGE: May 01, 1950 72 y.o.  Admit date: (Not on file) Referring Physician: Danise Mina Primary Physician: Ria Bush, MD Primary Cardiologist: Radford Pax before Reason for Consultation: Aortic Aneurysm    HPI:  72 y.o. re established care 07/26/20  History of HTN, DM-2, HLD, GERD, Prostate cancer and remote history of substance abuse. Had normal cath in 2013 And again in May 2020. Dr Irish Lack note indicates severe right subclavian tortuosity and not to use right radial for cath again EF normal 55-60% TTE done 04/12/20 showed aortic root 4.3 cm with EF 55-60% tricuspid AV with mild AR  CT done 07/26/20 showed no aortic aneurysm   He is long time divorced. Retired from cooking Has a daughter in town Works out and bakes   No cardiac complaints  Likes his [employer] and watches Regular church goer   No cardiac symptoms BP elevated Discussed increasing coreg and cozaar with f/u primary He has a cuff at home and will start to monitor 3x/week His weight is up and baking/eating too many sweets  ROS All other systems reviewed and negative except as noted above  Past Medical History:  Diagnosis Date   Acquired dilation of ascending aorta and aortic root    67mm ascending aorta   Allergic rhinitis    Anxiety    Atherosclerosis of aorta    CAD (coronary artery disease) cardiologist-- dr Tressia Miners turner   long hx atypical chest pain--- cardiac cath 08-24-2002 in epic showed normal coronaries;   03-17-2007  nuclear stress test showed ?small infarct , ef 60%;  cath done 12-31-2007 for recurrent CP showed nonob cad;   08-27-2018 stress test showed intermediate risk w/ apex ischemia, nuclear ef 61%;   cath done 08-29-2018 showed Nonobstructive 25% plaque in LAD    Chronic neck pain    narcotic dependant   DDD (degenerative disc disease), cervical    Depression    Diabetic neuropathy    Diverticulosis 2013   by CT  scan   Erectile dysfunction 08/01/2015   Essential hypertension 01/01/2008   Fatty liver 07/31/2018   By [place] 07/2018,  followed by pcp   GERD (gastroesophageal reflux disease)    Hemorrhoids    History of anemia    History of cocaine abuse    per pt quit 05/ 2004   History of cocaine use 2004   History of colon polyps    History of epididymitis    History of esophageal stricture    s/p  dilatation 2009;  12-30-2018   History of gastric ulcer    12-30-2018  EGD showed non-bleeding gastric ulcer   History of prostatitis 08/2011   History of transient ischemic attack (TIA)    remote hx , related to cocaine abuse which pt quit 2005   Hyperlipidemia    Hypertension    Essential   OSA (obstructive sleep apnea)    study 05-07-2008 in epic , mild ,  pt does not use cpap   Pain due to onychomycosis of toenails of both feet 10/15/2018   Prostate cancer urologist--- dr herrick/  oncologist--- dr Tammi Klippel   first dx 01/ 2018 w/ Stage T2a and Gleason 3+3 , active survillance;  repear bx 04-21-2019 Gleason 3+4,  scheduled for brachytherapy 07-03-2019   T2DM (type 2 diabetes mellitus)    followed by pcp---  (03-04-201  checks blood sugar 3 times daily,  fasting sugar's-- 140-150    Family  History  Problem Relation Age of Onset   Diabetes Mother    Hypertension Mother    Hyperlipidemia Mother    Diabetes Father    Diabetes Sister    Hypertension Sister    Hypertension Brother    Diabetes Brother    Diabetes Brother    Hypertension Brother    Diabetes Brother    Heart disease Maternal Uncle        Heart failure   Cancer Paternal Uncle        Prostate   Cancer Paternal Grandfather        Prostate   Stroke Neg Hx    Esophageal cancer Neg Hx    Rectal cancer Neg Hx    Stomach cancer Neg Hx    Breast cancer Neg Hx    Colon cancer Neg Hx     Social History   Socioeconomic History   Marital status: Single    Spouse name: Not on file   Number of children: 1   Years of education: Not  on file   Highest education level: Not on file  Occupational History   Occupation: [name] previously    Fish farm manager: UNEMPLOYED    Comment: Now on disability after neck surgery  Tobacco Use   Smoking status: Never   Smokeless tobacco: Never  Vaping Use   Vaping Use: Never used  Substance and Sexual Activity   Alcohol use: No    Alcohol/week: 0.0 standard drinks of alcohol   Drug use: Not Currently    Comment: H/O marijuana (last 2005); Cocaine 5/04   Sexual activity: Not on file  Other Topics Concern   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   Activity: no regular exercise, wants to start walking   Diet: good water, fruits/vegetables daily.      Retired   1 daughter and 1 granddaughter   Social Determinants of Radio broadcast assistant Strain: [place]  (09/05/2020)   Overall Financial Resource Strain (CARDIA)    Difficulty of Paying Living Expenses: Not very hard  Recent Concern: Financial Resource Strain - Medium Risk (06/07/2020)   Overall Financial Resource Strain (CARDIA)    Difficulty of Paying Living Expenses: Somewhat hard  Food Insecurity: No Food Insecurity (10/29/2019)   Hunger Vital Sign    Worried About Running Out of Food in the Last Year: Never true    Ran Out of Food in the Last Year: Never true  Transportation Needs: No Transportation Needs (10/29/2019)   PRAPARE - Hydrologist (Medical): No    Lack of Transportation (Non-Medical): No  Physical Activity: Inactive (10/29/2019)   Exercise Vital Sign    Days of Exercise per Week: 0 days    Minutes of Exercise per Session: 0 min  Stress: No Stress Concern Present (10/29/2019)   [place]    Feeling of Stress : Not at all  Social  Connections: Not on file  Intimate Partner Violence: Not At Risk (10/29/2019)   Humiliation, Afraid, Rape, and Kick questionnaire    Fear of Current or Ex-Partner: No    Emotionally Abused: No    Physically Abused: No    Sexually Abused: No    Past Surgical History:  Procedure Laterality Date   ANTERIOR  CERVICAL DECOMP/DISCECTOMY FUSION  06-24-2008  and 10-28-2008 @[facility]    06-25-2008  C7 -- T1;    10-28-2008  C5 -- C7   CARDIAC CATHETERIZATION  08-24-2002   dr gamble  @[facility]    normal coronaries   CARDIAC CATHETERIZATION  12-31-2007    dr Johnsie Cancel   nonobstructive CAD   CARPAL TUNNEL RELEASE Right 2000  approx.   CATARACT EXTRACTION W/ INTRAOCULAR LENS  IMPLANT, BILATERAL  2016   COLONOSCOPY  10/11/2011   severe diverticulosis, int hemorrhoids, rec rpt 5 yrs Carlean Purl)   COLONOSCOPY  12/2018   benign rectal polyp Carlean Purl)   DIRECT LARYNGOSCOPY  11/02/2008   @[facility]    Extubation under anesthesia post op complication from ACDF   ESOPHAGOGASTRODUODENOSCOPY  10/11/2011   WNL Carlean Purl)   ESOPHAGOGASTRODUODENOSCOPY  09/11-2018   gastric ulcer, gastritis, dilated esophageal stenosis, limit NSAIDs continue PPI, no f/u planned Carlean Purl)   EXCISION MASS NECK N/A 08/29/2016   Procedure: EXCISION MASS POSTERIOR NECK;  Surgeon: Erroll Luna, MD;  Location: [facility];  Service: General;  Laterality: N/A;   HEMATOMA EVACUATION  10/29/2008   @[facility]    post op ACDF 10-28-2008   LAPAROSCOPIC INGUINAL HERNIA REPAIR Bilateral 11-22-2006   @[facility]    AND EXCISION RLQ ABDOMINAL LIPOMA   LEFT HEART CATH AND CORONARY ANGIOGRAPHY N/A 08/29/2018   Procedure: LEFT HEART CATH AND CORONARY ANGIOGRAPHY;  Surgeon: Jettie Booze, MD;  Location: [facility];  Service: Cardiovascular;  Laterality: N/A;   RADIOACTIVE SEED IMPLANT N/A 07/03/2019   Procedure: RADIOACTIVE SEED IMPLANT/BRACHYTHERAPY IMPLANT;  Surgeon: Ardis Hughs, MD;  Location: [facility];  Service: Urology;  Laterality:  N/A;   SHOULDER ARTHROSCOPY WITH SUBACROMIAL DECOMPRESSION AND OPEN ROTATOR C Left 01/23/2013   Procedure: LEFT SHOULDER ARTHROSCOPY WITH SUBACROMIAL DECOMPRESSION AND MINI OPEN ROTATOR CUFF REPAIR, AND OPEN DISTAL CLAVICLE RESECTION;  Surgeon: Augustin Schooling, MD;  Location: [facility];  Service: Orthopedics;  Laterality: Left;   SHOULDER SURGERY Right 05/2011   SPACE OAR INSTILLATION N/A 07/03/2019   Procedure: SPACE OAR INSTILLATION;  Surgeon: Ardis Hughs, MD;  Location: [facility];  Service: Urology;  Laterality: N/A;   TONSILLECTOMY  child   TRIGGER FINGER RELEASE Right 12-12-2005   @[facility]    Right long finger      Current Outpatient Medications:    ammonium lactate (AMLACTIN) 12 % cream, Apply topically in the morning and at bedtime., Disp: , Rfl:    aspirin EC 81 MG tablet, Take 81 mg by mouth daily., Disp: , Rfl:    atorvastatin (LIPITOR) 80 MG tablet, TAKE 1 TABLET BY MOUTH ONCE DAILY, Disp: 90 tablet, Rfl: 3   benzonatate (TESSALON) 100 MG capsule, Take 1 capsule (100 mg total) by mouth every 8 (eight) hours., Disp: 21 capsule, Rfl: 0   carvedilol (COREG) 3.125 MG tablet, TAKE 1 TABLET BY MOUTH  TWICE DAILY WITH A MEAL, Disp: 180 tablet, Rfl: 3   empagliflozin (JARDIANCE) 10 MG TABS tablet, Take 1 tablet (10 mg total) by mouth daily., Disp: 90 tablet, Rfl: 3   fluticasone (FLONASE) 50 MCG/ACT nasal spray, USE 2 SPRAYS IN BOTH  NOSTRILS DAILY, Disp: 48 g, Rfl: 2   gabapentin (NEURONTIN) 100 MG capsule, TAKE 1 CAPSULE BY MOUTH  TWICE DAILY, Disp: 160 capsule, Rfl: 3   glucose blood (ONETOUCH ULTRA) test strip, Use as instructed, Disp: 200 strip, Rfl: 3   guaiFENesin (MUCINEX) 600 MG 12 hr tablet, Take 600 mg by mouth 2 (two) times  daily as needed for cough., Disp: , Rfl:    losartan (COZAAR) 50 MG tablet, TAKE 1 TABLET BY MOUTH  DAILY, Disp: 100 tablet, Rfl: 3   metFORMIN (GLUCOPHAGE) 1000 MG tablet, TAKE 1 TABLET BY MOUTH TWICE  DAILY WITH A MEAL, Disp: 200 tablet,  Rfl: 0   omeprazole (PRILOSEC) 40 MG capsule, TAKE 1 CAPSULE BY MOUTH  DAILY, Disp: 90 capsule, Rfl: 3   oxyCODONE-acetaminophen (PERCOCET) 10-325 MG tablet, Take 1 tablet by mouth every 4 (four) hours as needed for pain. (Patient taking differently: Take 1 tablet by mouth every 4 (four) hours.), Disp: 10 tablet, Rfl: 0   polyvinyl alcohol (LIQUIFILM TEARS) 1.4 % ophthalmic solution, Place 1 drop into both eyes as needed for dry eyes., Disp: , Rfl:    tamsulosin (FLOMAX) 0.4 MG CAPS capsule, Take 0.4 mg by mouth daily., Disp: , Rfl:    baclofen (LIORESAL) 10 MG tablet, TAKE 1 TABLET BY MOUTH AT  BEDTIME AS NEEDED FOR MUSCLE  SPASM(S) (Patient not taking: Reported on 07/26/2022), Disp: 100 tablet, Rfl: 1   clobetasol cream (TEMOVATE) 0.05 %, SMARTSIG:1 Topical Daily (Patient not taking: Reported on 07/26/2022), Disp: , Rfl:    doxycycline (VIBRAMYCIN) 100 MG capsule, Take 1 capsule (100 mg total) by mouth 2 (two) times daily. (Patient not taking: Reported on 07/26/2022), Disp: 20 capsule, Rfl: 0   sildenafil (VIAGRA) 100 MG tablet, Take 0.5-1 tablets (50-100 mg total) by mouth as needed for erectile dysfunction. (Patient not taking: Reported on 07/26/2022), Disp: 10 tablet, Rfl: 1  Current Facility-Administered Medications:    triamcinolone (KENALOG) 0.1 % cream, , Topical, BID, Regal, Tamala Fothergill, DPM  triamcinolone cream   Topical BID     Physical Exam: Blood pressure (!) 150/96, pulse 82, height 5\' 7"  (1.702 m), weight 236 lb (107 kg), SpO2 97 %.    Affect appropriate Healthy:  appears stated age 48: normal Neck supple with no adenopathy JVP normal no bruits no thyromegaly Lungs clear with no wheezing and good diaphragmatic motion Heart:  S1/S2 no murmur, no rub, gallop or click PMI normal Abdomen: benighn, BS positve, no tenderness, no AAA no bruit.  No HSM or HJR Distal pulses intact with no bruits No edema Neuro non-focal Skin warm and dry No muscular weakness   Labs:   Lab Results   Component Value Date   WBC 5.2 10/17/2021   HGB 13.6 10/17/2021   HCT 41.2 10/17/2021   MCV 93.7 10/17/2021   PLT 243.0 10/17/2021   No results for input(s): "NA", "K", "CL", "CO2", "BUN", "CREATININE", "CALCIUM", "PROT", "BILITOT", "ALKPHOS", "ALT", "AST", "GLUCOSE" in the last 168 hours.  Invalid input(s): "LABALBU" Lab Results  Component Value Date   CKTOTAL 4,038 (H) 11/03/2008   CKMB 2.4 11/02/2007   TROPONINI <0.03 08/26/2018    Lab Results  Component Value Date   CHOL 108 10/17/2021   CHOL 106 10/26/2020   CHOL 105 10/30/2019   Lab Results  Component Value Date   HDL 41.30 10/17/2021   HDL 34.20 (L) 10/26/2020   HDL 30.70 (L) 10/30/2019   Lab Results  Component Value Date   LDLCALC 52 10/17/2021   LDLCALC 59 10/26/2020   LDLCALC 61 10/30/2019   Lab Results  Component Value Date   TRIG 75.0 10/17/2021   TRIG 62.0 10/26/2020   TRIG 67.0 10/30/2019   Lab Results  Component Value Date   CHOLHDL 3 10/17/2021   CHOLHDL 3 10/26/2020   CHOLHDL 3 10/30/2019   Lab Results  Component Value Date   LDLDIRECT 104 (H) 12/26/2012   LDLDIRECT 162.4 03/02/2010      Radiology: No results found.  EKG: SR rate 65 low voltage 03/08/20 07/26/2022 SR rate 82 PVC low voltage    ASSESSMENT AND PLAN:   1. Chest Pain: non cardiac normal cath 08/29/2018 observe 2. Aneurysm:  Dilated root by echo but normal on CTA 07/25/20 reviewed 3.  HTN:  not controlled increase coreg/cozaar f/u primary   4. DM:  Discussed low carb diet.  Target hemoglobin A1c is 6.5 or less.  Continue current medications. 5. HLD  On statin labs with primary   Increase coreg to 6.25 bid and cozaar 100 mg daily Has f/u with primary soon   F/u in a year    Signed: Jenkins Rouge 07/26/2022, 9:47 AM

## 2022-07-26 ENCOUNTER — Other Ambulatory Visit: Payer: Self-pay | Admitting: Family Medicine

## 2022-07-26 ENCOUNTER — Ambulatory Visit: Payer: Medicare Other | Attending: Cardiovascular Disease | Admitting: Cardiovascular Disease

## 2022-07-26 ENCOUNTER — Encounter: Payer: Self-pay | Admitting: Cardiovascular Disease

## 2022-07-26 VITALS — BP 150/96 | HR 82 | Ht 67.0 in | Wt 236.0 lb

## 2022-07-26 DIAGNOSIS — I10 Essential (primary) hypertension: Secondary | ICD-10-CM

## 2022-07-26 DIAGNOSIS — E7800 Pure hypercholesterolemia, unspecified: Secondary | ICD-10-CM

## 2022-07-26 DIAGNOSIS — I2510 Atherosclerotic heart disease of native coronary artery without angina pectoris: Secondary | ICD-10-CM | POA: Diagnosis not present

## 2022-07-26 MED ORDER — LOSARTAN POTASSIUM 100 MG PO TABS: 100.0000 mg | ORAL_TABLET | Freq: Every day | ORAL | 3 refills | Status: DC

## 2022-07-26 MED ORDER — CARVEDILOL 6.25 MG PO TABS: 6.2500 mg | ORAL_TABLET | Freq: Two times a day (BID) | ORAL | 3 refills | Status: DC

## 2022-07-26 NOTE — Patient Instructions (Addendum)
Medication Instructions:  Your physician has recommended you make the following change in your medication:  1-INCREASE Losartan 100 mg by mouth daily 2-INCREASE Coreg 6.25 mg by mouth twice daily.  *If you need a refill on your cardiac medications before your next appointment, please call your pharmacy*  Lab Work: If you have labs (blood work) drawn today and your tests are completely normal, you will receive your results only by: [place] (if you have MyChart) OR A paper copy in the mail If you have any lab test that is abnormal or we need to change your treatment, we will call you to review the results.  Testing/Procedures: None ordered today.  Follow-Up: At [facility], you and your health needs are our priority.  As part of our continuing mission to provide you with exceptional heart care, we have created designated Provider Care Teams.  These Care Teams include your primary Cardiologist (physician) and Advanced Practice Providers (APPs -  Physician Assistants and Nurse Practitioners) who all work together to provide you with the care you need, when you need it.  We recommend signing up for the patient portal called "MyChart".  Sign up information is provided on this After Visit Summary.  MyChart is used to connect with patients for Virtual Visits (Telemedicine).  Patients are able to view lab/test results, encounter notes, upcoming appointments, etc.  Non-urgent messages can be sent to your provider as well.   To learn more about what you can do with MyChart, go to [email or web address].    Your next appointment:   1 year(s)  Provider:   Jenkins Rouge, MD

## 2022-07-30 DIAGNOSIS — Z79899 Other long term (current) drug therapy: Secondary | ICD-10-CM | POA: Diagnosis not present

## 2022-07-30 DIAGNOSIS — M5030 Other cervical disc degeneration, unspecified cervical region: Secondary | ICD-10-CM | POA: Diagnosis not present

## 2022-07-30 DIAGNOSIS — Z79891 Long term (current) use of opiate analgesic: Secondary | ICD-10-CM | POA: Diagnosis not present

## 2022-07-30 DIAGNOSIS — Z5181 Encounter for therapeutic drug level monitoring: Secondary | ICD-10-CM | POA: Diagnosis not present

## 2022-07-30 DIAGNOSIS — M961 Postlaminectomy syndrome, not elsewhere classified: Secondary | ICD-10-CM | POA: Diagnosis not present

## 2022-08-08 ENCOUNTER — Encounter: Payer: Self-pay | Admitting: Family Medicine

## 2022-08-08 DIAGNOSIS — H35372 Puckering of macula, left eye: Secondary | ICD-10-CM | POA: Diagnosis not present

## 2022-08-08 DIAGNOSIS — H0102B Squamous blepharitis left eye, upper and lower eyelids: Secondary | ICD-10-CM | POA: Diagnosis not present

## 2022-08-08 DIAGNOSIS — H1045 Other chronic allergic conjunctivitis: Secondary | ICD-10-CM | POA: Diagnosis not present

## 2022-08-08 DIAGNOSIS — E119 Type 2 diabetes mellitus without complications: Secondary | ICD-10-CM | POA: Diagnosis not present

## 2022-08-08 DIAGNOSIS — H43811 Vitreous degeneration, right eye: Secondary | ICD-10-CM | POA: Diagnosis not present

## 2022-08-08 DIAGNOSIS — H353131 Nonexudative age-related macular degeneration, bilateral, early dry stage: Secondary | ICD-10-CM | POA: Diagnosis not present

## 2022-08-08 DIAGNOSIS — Z961 Presence of intraocular lens: Secondary | ICD-10-CM | POA: Diagnosis not present

## 2022-08-08 DIAGNOSIS — H40013 Open angle with borderline findings, low risk, bilateral: Secondary | ICD-10-CM | POA: Diagnosis not present

## 2022-08-08 DIAGNOSIS — H0102A Squamous blepharitis right eye, upper and lower eyelids: Secondary | ICD-10-CM | POA: Diagnosis not present

## 2022-08-08 LAB — HM DIABETES EYE EXAM

## 2022-09-01 DIAGNOSIS — M5412 Radiculopathy, cervical region: Secondary | ICD-10-CM | POA: Diagnosis not present

## 2022-10-03 ENCOUNTER — Other Ambulatory Visit: Payer: Self-pay | Admitting: Family Medicine

## 2022-10-03 DIAGNOSIS — E118 Type 2 diabetes mellitus with unspecified complications: Secondary | ICD-10-CM

## 2022-11-16 ENCOUNTER — Ambulatory Visit: Payer: Medicare Other | Admitting: Podiatry

## 2022-11-16 DIAGNOSIS — M7751 Other enthesopathy of right foot: Secondary | ICD-10-CM

## 2022-11-16 NOTE — Progress Notes (Signed)
Subjective:  Patient ID: [name], male    DOB: 07-Nov-1950,  MRN: [id number]  Chief Complaint  Patient presents with   Diabetes    Patient came in today diabetic foot care, nail trim,     72 y.o. male presents with the above complaint.  Patient presents with right first metatarsophalangeal joint pain again.  Patient states injection helped.  He would like to do another one since it gave him so much relief.   Review of Systems: Negative except as noted in the HPI. Denies N/V/F/Ch.  Past Medical History:  Diagnosis Date   Acquired dilation of ascending aorta and aortic root (HCC)    43mm ascending aorta   Allergic rhinitis    Anxiety    Atherosclerosis of aorta (HCC)    CAD (coronary artery disease) cardiologist-- dr Gloris [place] turner   long hx atypical chest pain--- cardiac cath 08-24-2002 in epic showed normal coronaries;   03-17-2007  nuclear stress test showed ?small infarct , ef 60%;  cath done 12-31-2007 for recurrent CP showed nonob cad;   08-27-2018 stress test showed intermediate risk w/ apex ischemia, nuclear ef 61%;   cath done 08-29-2018 showed Nonobstructive 25% plaque in LAD    Chronic neck pain    narcotic dependant   DDD (degenerative disc disease), cervical    Depression    Diabetic neuropathy (HCC)    Diverticulosis 2013   by CT scan   Erectile dysfunction 08/01/2015   Essential hypertension 01/01/2008   Fatty liver 07/31/2018   By [place] 07/2018,  followed by pcp   GERD (gastroesophageal reflux disease)    Hemorrhoids    History of anemia    History of cocaine abuse (HCC)    per pt quit 05/ 2004   History of cocaine use 2004   History of colon polyps    History of epididymitis    History of esophageal stricture    s/p  dilatation 2009;  12-30-2018   History of gastric ulcer    12-30-2018  EGD showed non-bleeding gastric ulcer   History of prostatitis 08/2011   History of transient ischemic attack (TIA)    remote hx , related to cocaine abuse which pt  quit 2005   Hyperlipidemia    Hypertension    Essential   OSA (obstructive sleep apnea)    study 05-07-2008 in epic , mild ,  pt does not use cpap   Pain due to onychomycosis of toenails of both feet 10/15/2018   Prostate cancer [facility]) urologist--- dr herrick/  oncologist--- dr [name]   first dx 01/ 2018 w/ Stage T2a and Gleason 3+3 , active survillance;  repear bx 04-21-2019 Gleason 3+4,  scheduled for brachytherapy 07-03-2019   T2DM (type 2 diabetes mellitus) (HCC)    followed by pcp---  (03-04-201  checks blood sugar 3 times daily,  fasting sugar's-- 140-150    Current Outpatient Medications:    ammonium lactate (AMLACTIN) 12 % cream, Apply topically in the morning and at bedtime., Disp: , Rfl:    aspirin EC 81 MG tablet, Take 81 mg by mouth daily., Disp: , Rfl:    atorvastatin (LIPITOR) 80 MG tablet, TAKE 1 TABLET BY MOUTH ONCE DAILY, Disp: 90 tablet, Rfl: 3   baclofen (LIORESAL) 10 MG tablet, TAKE 1 TABLET BY MOUTH AT  BEDTIME AS NEEDED FOR MUSCLE  SPASM(S) (Patient not taking: Reported on 07/26/2022), Disp: 100 tablet, Rfl: 1   benzonatate (TESSALON) 100 MG capsule, Take 1 capsule (100 mg total) by  mouth every 8 (eight) hours., Disp: 21 capsule, Rfl: 0   carvedilol (COREG) 6.25 MG tablet, Take 1 tablet (6.25 mg total) by mouth 2 (two) times daily with a meal., Disp: 90 tablet, Rfl: 3   clobetasol cream (TEMOVATE) 0.05 %, SMARTSIG:1 Topical Daily (Patient not taking: Reported on 07/26/2022), Disp: , Rfl:    doxycycline (VIBRAMYCIN) 100 MG capsule, Take 1 capsule (100 mg total) by mouth 2 (two) times daily. (Patient not taking: Reported on 07/26/2022), Disp: 20 capsule, Rfl: 0   empagliflozin (JARDIANCE) 10 MG TABS tablet, Take 1 tablet (10 mg total) by mouth daily., Disp: 90 tablet, Rfl: 3   fluticasone (FLONASE) 50 MCG/ACT nasal spray, USE 2 SPRAYS IN BOTH  NOSTRILS DAILY, Disp: 48 g, Rfl: 2   gabapentin (NEURONTIN) 100 MG capsule, TAKE 1 CAPSULE BY MOUTH  TWICE DAILY, Disp: 160 capsule, Rfl:  3   glucose blood (ONETOUCH ULTRA) test strip, Use as instructed to check blood sugar 2 times a day, Disp: 200 strip, Rfl: 3   guaiFENesin (MUCINEX) 600 MG 12 hr tablet, Take 600 mg by mouth 2 (two) times daily as needed for cough., Disp: , Rfl:    losartan (COZAAR) 100 MG tablet, Take 1 tablet (100 mg total) by mouth daily., Disp: 90 tablet, Rfl: 3   metFORMIN (GLUCOPHAGE) 1000 MG tablet, TAKE 1 TABLET BY MOUTH TWICE  DAILY WITH A MEAL, Disp: 200 tablet, Rfl: 0   omeprazole (PRILOSEC) 40 MG capsule, TAKE 1 CAPSULE BY MOUTH  DAILY, Disp: 90 capsule, Rfl: 3   oxyCODONE-acetaminophen (PERCOCET) 10-325 MG tablet, Take 1 tablet by mouth every 4 (four) hours as needed for pain. (Patient taking differently: Take 1 tablet by mouth every 4 (four) hours.), Disp: 10 tablet, Rfl: 0   polyvinyl alcohol (LIQUIFILM TEARS) 1.4 % ophthalmic solution, Place 1 drop into both eyes as needed for dry eyes., Disp: , Rfl:    sildenafil (VIAGRA) 100 MG tablet, Take 0.5-1 tablets (50-100 mg total) by mouth as needed for erectile dysfunction. (Patient not taking: Reported on 07/26/2022), Disp: 10 tablet, Rfl: 1   tamsulosin (FLOMAX) 0.4 MG CAPS capsule, Take 0.4 mg by mouth daily., Disp: , Rfl:   Current Facility-Administered Medications:    triamcinolone (KENALOG) 0.1 % cream, , Topical, BID, Regal, Kirstie Peri, DPM  Social History   Tobacco Use  Smoking Status Never  Smokeless Tobacco Never    Allergies  Allergen Reactions   Glimepiride Other (See Comments)    Headache, back pain   Menthol (Topical Analgesic) Rash    Burning rash   Objective:  There were no vitals filed for this visit. There is no height or weight on file to calculate BMI. Constitutional Well developed. Well nourished.  Vascular Dorsalis pedis pulses palpable bilaterally. Posterior tibial pulses palpable bilaterally. Capillary refill normal to all digits.  No cyanosis or clubbing noted. Pedal hair growth normal.  Neurologic Normal  speech. Oriented to person, place, and time. Epicritic sensation to light touch grossly present bilaterally.  Dermatologic Nails well groomed and normal in appearance. No open wounds. No skin lesions.  Orthopedic: Right first MTP capsulitis.  Pain on palpation.  Pain with range of motion of the joint.  No deep intra-articular pain noted   Radiographs: None Assessment:   1. Capsulitis of metatarsophalangeal (MTP) joint of right foot     Plan:  Patient was evaluated and treated and all questions answered.  Right first MTP capsulitis -Explained to patient the etiology of capsulitis was treatment options  were discussed.  Given the amount of pain that is any benefit from steroid injection or because of inflammatory component associate with pain.  Patient agrees with plan like to proceed with steroid injection. -Another steroid injection was performed at right first MTP using 1% plain Lidocaine and 10 mg of Kenalog. This was well tolerated.   No follow-ups on file.

## 2022-12-03 DIAGNOSIS — M5412 Radiculopathy, cervical region: Secondary | ICD-10-CM | POA: Diagnosis not present

## 2022-12-03 DIAGNOSIS — M961 Postlaminectomy syndrome, not elsewhere classified: Secondary | ICD-10-CM | POA: Diagnosis not present

## 2022-12-04 ENCOUNTER — Ambulatory Visit (INDEPENDENT_AMBULATORY_CARE_PROVIDER_SITE_OTHER): Payer: Medicare Other

## 2022-12-04 VITALS — BP 119/68 | HR 60 | Ht 67.0 in | Wt 230.0 lb

## 2022-12-04 DIAGNOSIS — Z0000 Encounter for general adult medical examination without abnormal findings: Secondary | ICD-10-CM | POA: Diagnosis not present

## 2022-12-04 NOTE — Progress Notes (Signed)
 Because this visit was a virtual/telehealth visit,  certain criteria was not obtained, such a blood pressure, CBG if patient is a diabetic, and timed up and go. Any medications not marked as "taking" was not mentioned during the medication reconciliation part of the visit. Any vitals not documented were not able to be obtained due to this being a telehealth visit. Vitals documented are verbally provided by the patient.   Subjective:   [name] is a 72 y.o. male who presents for Medicare Annual/Subsequent preventive examination.  Visit Complete: Virtual  I connected with  [name] on 12/04/22 by a audio enabled telemedicine application and verified that I am speaking with the correct person using two identifiers.  Patient Location: Home  Provider Location: Home Office  I discussed the limitations of evaluation and management by telemedicine. The patient expressed understanding and agreed to proceed.  Patient Medicare AWV questionnaire was completed by the patient on n/a; I have confirmed that all information answered by patient is correct and no changes since this date.  Review of Systems     Cardiac Risk Factors include: advanced age (>60men, >67 women);diabetes mellitus;dyslipidemia;hypertension;male gender;obesity (BMI >30kg/m2);sedentary lifestyle     Objective:    Today's Vitals   12/04/22 1015  BP: 119/68  Pulse: 60  Weight: 230 lb (104.3 kg)  Height: 5\' 7"  (1.702 m)   Body mass index is 36.02 kg/m.     12/04/2022   10:19 AM 04/13/2021    4:51 AM 11/21/2020    5:24 PM 03/07/2020    1:23 PM 12/13/2019   11:28 AM 10/29/2019    9:18 AM 07/16/2019    9:49 AM  Advanced Directives  Does Patient Have a Medical Advance Directive? No No Yes No No No No  Type of Advance Directive   Healthcare Power of Attorney      Does patient want to make changes to medical advance directive?       No - Patient declined  Would patient like information on creating a medical  advance directive? No - Patient declined No - Patient declined    No - Patient declined No - Patient declined    Current Medications (verified) Outpatient Encounter Medications as of 12/04/2022  Medication Sig   aspirin EC 81 MG tablet Take 81 mg by mouth daily.   atorvastatin (LIPITOR) 80 MG tablet TAKE 1 TABLET BY MOUTH ONCE DAILY   benzonatate (TESSALON) 100 MG capsule Take 1 capsule (100 mg total) by mouth every 8 (eight) hours.   carvedilol (COREG) 6.25 MG tablet Take 1 tablet (6.25 mg total) by mouth 2 (two) times daily with a meal.   empagliflozin (JARDIANCE) 10 MG TABS tablet Take 1 tablet (10 mg total) by mouth daily.   fluticasone (FLONASE) 50 MCG/ACT nasal spray USE 2 SPRAYS IN BOTH  NOSTRILS DAILY   gabapentin (NEURONTIN) 100 MG capsule TAKE 1 CAPSULE BY MOUTH  TWICE DAILY   glucose blood (ONETOUCH ULTRA) test strip Use as instructed to check blood sugar 2 times a day   guaiFENesin (MUCINEX) 600 MG 12 hr tablet Take 600 mg by mouth 2 (two) times daily as needed for cough.   losartan (COZAAR) 100 MG tablet Take 1 tablet (100 mg total) by mouth daily.   metFORMIN (GLUCOPHAGE) 1000 MG tablet TAKE 1 TABLET BY MOUTH TWICE  DAILY WITH A MEAL   omeprazole (PRILOSEC) 40 MG capsule TAKE 1 CAPSULE BY MOUTH  DAILY   oxyCODONE-acetaminophen (PERCOCET) 10-325 MG tablet Take 1  tablet by mouth every 4 (four) hours as needed for pain. (Patient taking differently: Take 1 tablet by mouth every 4 (four) hours.)   polyvinyl alcohol (LIQUIFILM TEARS) 1.4 % ophthalmic solution Place 1 drop into both eyes as needed for dry eyes.   ammonium lactate (AMLACTIN) 12 % cream Apply topically in the morning and at bedtime. (Patient not taking: Reported on 12/04/2022)   baclofen (LIORESAL) 10 MG tablet TAKE 1 TABLET BY MOUTH AT  BEDTIME AS NEEDED FOR MUSCLE  SPASM(S) (Patient not taking: Reported on 07/26/2022)   clobetasol cream (TEMOVATE) 0.05 % SMARTSIG:1 Topical Daily (Patient not taking: Reported on 07/26/2022)    doxycycline (VIBRAMYCIN) 100 MG capsule Take 1 capsule (100 mg total) by mouth 2 (two) times daily. (Patient not taking: Reported on 07/26/2022)   sildenafil (VIAGRA) 100 MG tablet Take 0.5-1 tablets (50-100 mg total) by mouth as needed for erectile dysfunction. (Patient not taking: Reported on 07/26/2022)   tamsulosin (FLOMAX) 0.4 MG CAPS capsule Take 0.4 mg by mouth daily. (Patient not taking: Reported on 12/04/2022)   Facility-Administered Encounter Medications as of 12/04/2022  Medication   triamcinolone (KENALOG) 0.1 % cream    Allergies (verified) Glimepiride and Menthol (topical analgesic)   History: Past Medical History:  Diagnosis Date   Acquired dilation of ascending aorta and aortic root (HCC)    43mm ascending aorta   Allergic rhinitis    Anxiety    Atherosclerosis of aorta (HCC)    CAD (coronary artery disease) cardiologist-- dr Gloris [place] turner   long hx atypical chest pain--- cardiac cath 08-24-2002 in epic showed normal coronaries;   03-17-2007  nuclear stress test showed ?small infarct , ef 60%;  cath done 12-31-2007 for recurrent CP showed nonob cad;   08-27-2018 stress test showed intermediate risk w/ apex ischemia, nuclear ef 61%;   cath done 08-29-2018 showed Nonobstructive 25% plaque in LAD    Chronic neck pain    narcotic dependant   DDD (degenerative disc disease), cervical    Depression    Diabetic neuropathy (HCC)    Diverticulosis 2013   by CT scan   Erectile dysfunction 08/01/2015   Essential hypertension 01/01/2008   Fatty liver 07/31/2018   By [place] 07/2018,  followed by pcp   GERD (gastroesophageal reflux disease)    Hemorrhoids    History of anemia    History of cocaine abuse (HCC)    per pt quit 05/ 2004   History of cocaine use 2004   History of colon polyps    History of epididymitis    History of esophageal stricture    s/p  dilatation 2009;  12-30-2018   History of gastric ulcer    12-30-2018  EGD showed non-bleeding gastric ulcer   History of  prostatitis 08/2011   History of transient ischemic attack (TIA)    remote hx , related to cocaine abuse which pt quit 2005   Hyperlipidemia    Hypertension    Essential   OSA (obstructive sleep apnea)    study 05-07-2008 in epic , mild ,  pt does not use cpap   Pain due to onychomycosis of toenails of both feet 10/15/2018   Prostate cancer [facility]) urologist--- dr herrick/  oncologist--- dr [name]   first dx 01/ 2018 w/ Stage T2a and Gleason 3+3 , active survillance;  repear bx 04-21-2019 Gleason 3+4,  scheduled for brachytherapy 07-03-2019   T2DM (type 2 diabetes mellitus) (HCC)    followed by pcp---  (03-04-201  checks blood sugar  3 times daily,  fasting sugar's-- 140-150   Past Surgical History:  Procedure Laterality Date   ANTERIOR CERVICAL DECOMP/DISCECTOMY FUSION  06-24-2008  and 10-28-2008 @[facility]    06-25-2008  C7 -- T1;    10-28-2008  C5 -- C7   CARDIAC CATHETERIZATION  08-24-2002   dr gamble  @[facility]    normal coronaries   CARDIAC CATHETERIZATION  12-31-2007    dr Eden Emms   nonobstructive CAD   CARPAL TUNNEL RELEASE Right 2000  approx.   CATARACT EXTRACTION W/ INTRAOCULAR LENS  IMPLANT, BILATERAL  2016   COLONOSCOPY  10/11/2011   severe diverticulosis, int hemorrhoids, rec rpt 5 yrs [name])   COLONOSCOPY  12/2018   benign rectal polyp [name])   DIRECT LARYNGOSCOPY  11/02/2008   @[facility]    Extubation under anesthesia post op complication from ACDF   ESOPHAGOGASTRODUODENOSCOPY  10/11/2011   WNL [name])   ESOPHAGOGASTRODUODENOSCOPY  09/11-2018   gastric ulcer, gastritis, dilated esophageal stenosis, limit NSAIDs continue PPI, no f/u planned [name])   EXCISION MASS NECK N/A 08/29/2016   Procedure: EXCISION MASS POSTERIOR NECK;  Surgeon: Harriette Bouillon, MD;  Location: [facility];  Service: General;  Laterality: N/A;   HEMATOMA EVACUATION  10/29/2008   @[facility]    post op ACDF 10-28-2008   LAPAROSCOPIC INGUINAL HERNIA REPAIR Bilateral 11-22-2006   @[facility]    AND EXCISION RLQ  ABDOMINAL LIPOMA   LEFT HEART CATH AND CORONARY ANGIOGRAPHY N/A 08/29/2018   Procedure: LEFT HEART CATH AND CORONARY ANGIOGRAPHY;  Surgeon: Corky Crafts, MD;  Location: [facility];  Service: Cardiovascular;  Laterality: N/A;   RADIOACTIVE SEED IMPLANT N/A 07/03/2019   Procedure: RADIOACTIVE SEED IMPLANT/BRACHYTHERAPY IMPLANT;  Surgeon: Crist Fat, MD;  Location: [facility];  Service: Urology;  Laterality: N/A;   SHOULDER ARTHROSCOPY WITH SUBACROMIAL DECOMPRESSION AND OPEN ROTATOR C Left 01/23/2013   Procedure: LEFT SHOULDER ARTHROSCOPY WITH SUBACROMIAL DECOMPRESSION AND MINI OPEN ROTATOR CUFF REPAIR, AND OPEN DISTAL CLAVICLE RESECTION;  Surgeon: Verlee Rossetti, MD;  Location: [facility];  Service: Orthopedics;  Laterality: Left;   SHOULDER SURGERY Right 05/2011   SPACE OAR INSTILLATION N/A 07/03/2019   Procedure: SPACE OAR INSTILLATION;  Surgeon: Crist Fat, MD;  Location[facility];  Service: Urology;  Laterality: N/A;   TONSILLECTOMY  child   TRIGGER FINGER RELEASE Right 12-12-2005   @[facility]    Right long finger   Family History  Problem Relation Age of Onset   Diabetes Mother    Hypertension Mother    Hyperlipidemia Mother    Diabetes Father    Diabetes Sister    Hypertension Sister    Hypertension Brother    Diabetes Brother    Diabetes Brother    Hypertension Brother    Diabetes Brother    Heart disease Maternal Uncle        Heart failure   Cancer Paternal Uncle        Prostate   Cancer Paternal Grandfather        Prostate   Stroke Neg Hx    Esophageal cancer Neg Hx    Rectal cancer Neg Hx    Stomach cancer Neg Hx    Breast cancer Neg Hx    Colon cancer Neg Hx    Social History   Socioeconomic History   Marital status: Single    Spouse name: Not on file   Number of children: 1   Years of education: Not on file   Highest education level: Not on  file  Occupational History   Occupation: Pension scheme manager  previously    Associate Professor: UNEMPLOYED    Comment: Now on disability after neck surgery  Tobacco Use   Smoking status: Never   Smokeless tobacco: Never  Vaping Use   Vaping status: Never Used  Substance and Sexual Activity   Alcohol use: No    Alcohol/week: 0.0 standard drinks of alcohol   Drug use: Not Currently    Comment: H/O marijuana (last 2005); Cocaine 5/04   Sexual activity: Not on file  Other Topics Concern   Not on file  Social History Narrative   Lives alone   Occupation: Has worked as Estate manager/land agent in hospital, Solicitor in a store, 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   Activity: no regular exercise, wants to start walking   Diet: good water, fruits/vegetables daily.      Retired   1 daughter and 1 granddaughter   Social Determinants of Corporate investment banker Strain: Low Risk  (12/04/2022)   Overall Financial Resource Strain (CARDIA)    Difficulty of Paying Living Expenses: Not hard at all  Food Insecurity: No Food Insecurity (12/04/2022)   Hunger Vital Sign    Worried About Running Out of Food in the Last Year: Never true    Ran Out of Food in the Last Year: Never true  Transportation Needs: No Transportation Needs (12/04/2022)   PRAPARE - Administrator, Civil Service (Medical): No    Lack of Transportation (Non-Medical): No  Physical Activity: Inactive (12/04/2022)   Exercise Vital Sign    Days of Exercise per Week: 0 days    Minutes of Exercise per Session: 0 min  Stress: No Stress Concern Present (12/04/2022)   [employer] of Occupational Health - Occupational Stress Questionnaire    Feeling of Stress : Not at all  Social Connections: Moderately Isolated (12/04/2022)   Social Connection and Isolation Panel [NHANES]    Frequency of Communication with Friends and Family: More than three times a week    Frequency of Social Gatherings with Friends and Family: More than three times a week     Attends Religious Services: 1 to 4 times per year    Active Member of [employer] or Organizations: No    Attends Banker Meetings: Never    Marital Status: Divorced    Tobacco Counseling Counseling given: Yes   Clinical Intake:  Pre-visit preparation completed: Yes  Pain : No/denies pain     BMI - recorded: 36.02 Nutritional Status: BMI > 30  Obese Nutritional Risks: Other (Comment) Diabetes: Yes CBG done?: No (telehealth visit. patient states cbg was 142 this morning. checks his cbg TID) Did pt. bring in CBG monitor from home?: No  How often do you need to have someone help you when you read instructions, pamphlets, or other written materials from your doctor or pharmacy?: 1 - Never  Interpreter Needed?: No  Information entered by ::  ,CMA   Activities of Daily Living    12/04/2022   10:18 AM  In your present state of health, do you have any difficulty performing the following activities:  Hearing? 0  Vision? 0  Difficulty concentrating or making decisions? 0  Walking or climbing stairs? 0  Dressing or bathing? 0  Doing errands, shopping? 0  Preparing Food and eating ? N  Using the Toilet? N  In the past six months, have you accidently leaked urine? N  Do you have problems with loss of bowel control? N  Managing your Medications? N  Managing your Finances? N  Housekeeping or managing your Housekeeping? N    Patient Care Team: Eustaquio Boyden, MD as PCP - General (Family Medicine) Wendall Stade, MD as PCP - Cardiology (Cardiology) Joaquim Nam, MD (Family Medicine) Hanley Seamen Dustin Folks, MD as Referring Physician (Optometry) Beverely Low, MD as Consulting Physician (Orthopedic Surgery) Venita Lick, MD as Consulting Physician (Orthopedic Surgery) Crist Fat, MD as Consulting Physician (Urology) Margaretmary Dys, MD as Consulting Physician (Radiation Oncology) Felicita Gage, RN Nurse Navigator as Registered Nurse (Medical  Oncology)  Indicate any recent Medical Services you may have received from other than [facility] providers in the past year (date may be approximate).     Assessment:   This is a routine wellness examination for [place].  Hearing/Vision screen Hearing Screening - Comments:: Patient denies any hearing difficulties.    Dietary issues and exercise activities discussed:     Goals Addressed             This Visit's Progress    Patient Stated       "Remain active and healthy and work on decreasing pain level        Depression Screen    12/04/2022   10:20 AM 10/17/2021   11:47 AM 11/02/2020   10:03 AM 10/29/2019    9:22 AM 10/16/2018    2:09 PM 08/13/2018   11:12 AM 03/04/2017    8:36 AM  PHQ 2/9 Scores  PHQ - 2 Score 0 0 0 0 0 0 0  PHQ- 9 Score  0 0 0 0  0    Fall Risk    12/04/2022   10:19 AM 10/17/2021   11:03 AM 11/02/2020   10:04 AM 10/29/2019    9:21 AM 10/16/2018    2:09 PM  Fall Risk   Falls in the past year? 0 0 0 0 0  Number falls in past yr: 0  0 0   Injury with Fall? 0  0 0   Risk for fall due to : No Fall Risks   Impaired balance/gait;Medication side effect   Follow up Falls prevention discussed   Falls evaluation completed;Falls prevention discussed     MEDICARE RISK AT HOME:  Medicare Risk at Home - 12/04/22 1019     Any stairs in or around the home? No    If so, are there any without handrails? No    Home free of loose throw rugs in walkways, pet beds, electrical cords, etc? Yes    Adequate lighting in your home to reduce risk of falls? Yes    Life alert? No    Use of a cane, walker or w/c? No    Grab bars in the bathroom? No    Shower chair or bench in shower? No    Elevated toilet seat or a handicapped toilet? No             TIMED UP AND GO:  Was the test performed?  No    Cognitive Function:    10/29/2019    9:23 AM 10/16/2018    2:09 PM 03/04/2017    8:50 AM 02/02/2016    8:55 AM  MMSE - Mini Mental State Exam  Not completed: Refused      Orientation to time  5 5 5   Orientation to Place  5 5 5   Registration  3 3 3   Attention/ Calculation  0  0 0  Recall  3 3 3   Language- name 2 objects  0 0 0  Language- repeat  1 1 1   Language- follow 3 step command  0 3 3  Language- read & follow direction  0 0 0  Write a sentence  0 0 0  Copy design  0 0 0  Total score  17 20 20         12/04/2022   10:20 AM  6CIT Screen  What Year? 0 points  What month? 0 points  What time? 0 points  Count back from 20 0 points  Months in reverse 0 points  Repeat phrase 0 points  Total Score 0 points    Immunizations Immunization History  Administered Date(s) Administered   Fluad Quad(high Dose 65+) 02/03/2019, 03/09/2020, 04/18/2021, 04/18/2022   Influenza Split 02/06/2011, 01/08/2012   Influenza Whole 03/19/2007, 02/19/2008, 01/27/2009, 01/25/2010   Influenza, High Dose Seasonal PF 03/05/2018   Influenza,inj,Quad PF,6+ Mos 02/20/2013, 01/26/2014, 01/31/2015, 02/02/2016, 03/04/2017   Influenza,inj,quad, With Preservative 01/22/2019   PFIZER(Purple Top)SARS-COV-2 Vaccination 08/22/2019, 09/12/2019   Pneumococcal Conjugate-13 08/01/2015   Pneumococcal Polysaccharide-23 06/16/2012, 04/30/2019   Td 04/24/2003   Tdap 01/26/2014   Zoster, Live 12/15/2010    TDAP status: Up to date  Flu Vaccine status: Due, Education has been provided regarding the importance of this vaccine. Advised may receive this vaccine at local pharmacy or Health Dept. Aware to provide a copy of the vaccination record if obtained from local pharmacy or Health Dept. Verbalized acceptance and understanding.  Pneumococcal vaccine status: Up to date  Covid-19 vaccine status: Information provided on how to obtain vaccines.   Qualifies for Shingles Vaccine? Yes   Zostavax completed No   Shingrix Completed?: No.    Education has been provided regarding the importance of this vaccine. Patient has been advised to call insurance company to determine out of pocket expense  if they have not yet received this vaccine. Advised may also receive vaccine at local pharmacy or Health Dept. Verbalized acceptance and understanding.  Screening Tests Health Maintenance  Topic Date Due   Zoster Vaccines- Shingrix (1 of 2) 02[birth year]14/1969   COVID-19 Vaccine (3 - Pfizer risk series) 10/10/2019   FOOT EXAM  04/29/2021   Diabetic kidney evaluation - eGFR measurement  10/18/2022   Diabetic kidney evaluation - Urine ACR  10/18/2022   HEMOGLOBIN A1C  10/18/2022   Medicare Annual Wellness (AWV)  10/18/2022   INFLUENZA VACCINE  11/22/2022   OPHTHALMOLOGY EXAM  08/08/2023   DTaP/Tdap/Td (3 - Td or Tdap) 01/27/2024   Colonoscopy  12/29/2028   Pneumonia Vaccine 82+ Years old  Completed   Hepatitis C Screening  Completed   HPV VACCINES  Aged Out    Health Maintenance  Health Maintenance Due  Topic Date Due   Zoster Vaccines- Shingrix (1 of 2) 02/14/1969   COVID-19 Vaccine (3 - Pfizer risk series) 10/10/2019   FOOT EXAM  04/29/2021   Diabetic kidney evaluation - eGFR measurement  10/18/2022   Diabetic kidney evaluation - Urine ACR  10/18/2022   HEMOGLOBIN A1C  10/18/2022   Medicare Annual Wellness (AWV)  10/18/2022   INFLUENZA VACCINE  11/22/2022    Colorectal cancer screening: Type of screening: Colonoscopy. Completed 12/30/2018. Repeat every 10 years  Lung Cancer Screening: (Low Dose CT Chest recommended if Age 55-80 years, 20 pack-year currently smoking OR have quit w/in 15years.) does not qualify.   Additional Screening:  Hepatitis C Screening: does not qualify; Completed 08/01/2015  Vision  Screening: Recommended annual ophthalmology exams for early detection of glaucoma and other disorders of the eye. Is the patient up to date with their annual eye exam?  Yes  Who is the provider or what is the name of the office in which the patient attends annual eye exams? Dr. Dione Booze If pt is not established with a provider, would they like to be referred to a provider to establish  care? No .   Dental Screening: Recommended annual dental exams for proper oral hygiene  Diabetic Foot Exam: Diabetic Foot Exam: Overdue, Pt has been advised about the importance in completing this exam. Pt is scheduled for diabetic foot exam on 12/25/2022.  Community Resource Referral / Chronic Care Management: CRR required this visit?  No   CCM required this visit?  No     Plan:     I have personally reviewed and noted the following in the patient's chart:   Medical and social history Use of alcohol, tobacco or illicit drugs  Current medications and supplements including opioid prescriptions. Patient is currently taking opioid prescriptions. Information provided to patient regarding non-opioid alternatives. Patient advised to discuss non-opioid treatment plan with their provider. Functional ability and status Nutritional status Physical activity Advanced directives List of other physicians Hospitalizations, surgeries, and ER visits in previous 12 months Vitals Screenings to include cognitive, depression, and falls Referrals and appointments  In addition, I have reviewed and discussed with patient certain preventive protocols, quality metrics, and best practice recommendations. A written personalized care plan for preventive services as well as general preventive health recommendations were provided to patient.     Jordan Hawks , CMA   12/04/2022   After Visit Summary: (Mail) Due to this being a telephonic visit, the after visit summary with patients personalized plan was offered to patient via mail   Nurse Notes: Patient scheduled for an office visit on 12/25/2022 to follow up on diabetes

## 2022-12-04 NOTE — Patient Instructions (Signed)
[name] , Thank you for taking time to come for your Medicare Wellness Visit. I appreciate your ongoing commitment to your health goals. Please review the following plan we discussed and let me know if I can assist you in the future.   These are the goals we discussed:  Goals      Increase water intake     Starting 10/16/2018, I will continue to drink at least 6-8 glasses of water daily.      Monitor and Manage My Blood Sugar-Diabetes Type 2     Timeframe:  Long-Range Goal Priority:  High Start Date:              06/07/20               Expected End Date:        06/07/21  Follow Up Date - 1 month 07/05/20   - check blood sugar at prescribed times - check blood sugar if I feel it is too high or too low - take the blood sugar log to all doctor visits    Why is this important?   Checking your blood sugar at home helps to keep it from getting very high or very low.  Writing the results in a diary or log helps the doctor know how to care for you.  Your blood sugar log should have the time, date and the results.  Also, write down the amount of insulin or other medicine that you take.  Other information, like what you ate, exercise done and how you were feeling, will also be helpful.         Patient Stated     10/29/2019, I will maintain and continue medications as prescribed.     Patient Stated     "Remain active and healthy and work on decreasing pain level         This is a list of the screening recommended for you and due dates:  Health Maintenance  Topic Date Due   Zoster (Shingles) Vaccine (1 of 2) 02/14/1969   COVID-19 Vaccine (3 - Pfizer risk series) 10/10/2019   Complete foot exam   04/29/2021   Yearly kidney function blood test for diabetes  10/18/2022   Yearly kidney health urinalysis for diabetes  10/18/2022   Hemoglobin A1C  10/18/2022   Flu Shot  11/22/2022   Eye exam for diabetics  08/08/2023   Medicare Annual Wellness Visit  12/04/2023   DTaP/Tdap/Td vaccine  (3 - Td or Tdap) 01/27/2024   Colon Cancer Screening  12/29/2028   Pneumonia Vaccine  Completed   Hepatitis C Screening  Completed   HPV Vaccine  Aged Out    Advanced directives: Advance directive discussed with you today. Even though you declined this today, please call our office should you change your mind, and we can give you the proper paperwork for you to fill out. Advance care planning is a way to make decisions about medical care that fits your values in case you are ever unable to make these decisions for yourself.  Information on Advanced Care Planning can be found at [facility] of [facility] Advance Health Care Directives Advance Health Care Directives ([email or web address]    Conditions/risks identified:  You are due for a diabetic foot exam and an A1C check. You have been scheduled to see Dr. Sharen Hones on Sept 3, 2024 at 9:30am This visit will be in the office.  Next appointment: VIRTUAL/ TELEPHONE VISIT Follow up in one year  for your annual wellness visit  December 10, 2023 at 1pm telephone visit.    Preventive Care 66 Years and Older, Male  Preventive care refers to lifestyle choices and visits with your health care provider that can promote health and wellness. What does preventive care include? A yearly physical exam. This is also called an annual well check. Dental exams once or twice a year. Routine eye exams. Ask your health care provider how often you should have your eyes checked. Personal lifestyle choices, including: Daily care of your teeth and gums. Regular physical activity. Eating a healthy diet. Avoiding tobacco and drug use. Limiting alcohol use. Practicing safe sex. Taking low doses of aspirin every day. Taking vitamin and mineral supplements as recommended by your health care provider. What happens during an annual well check? The services and screenings done by your health care provider during your annual well check will depend on your age, overall  health, lifestyle risk factors, and family history of disease. Counseling  Your health care provider may ask you questions about your: Alcohol use. Tobacco use. Drug use. Emotional well-being. Home and relationship well-being. Sexual activity. Eating habits. History of falls. Memory and ability to understand (cognition). Work and work Astronomer. Screening  You may have the following tests or measurements: Height, weight, and BMI. Blood pressure. Lipid and cholesterol levels. These may be checked every 5 years, or more frequently if you are over 73 years old. Skin check. Lung cancer screening. You may have this screening every year starting at age 65 if you have a 30-pack-year history of smoking and currently smoke or have quit within the past 15 years. Fecal occult blood test (FOBT) of the stool. You may have this test every year starting at age 57. Flexible sigmoidoscopy or colonoscopy. You may have a sigmoidoscopy every 5 years or a colonoscopy every 10 years starting at age 27. Prostate cancer screening. Recommendations will vary depending on your family history and other risks. Hepatitis C blood test. Hepatitis B blood test. Sexually transmitted disease (STD) testing. Diabetes screening. This is done by checking your blood sugar (glucose) after you have not eaten for a while (fasting). You may have this done every 1-3 years. Abdominal aortic aneurysm (AAA) screening. You may need this if you are a current or former smoker. Osteoporosis. You may be screened starting at age 84 if you are at high risk. Talk with your health care provider about your test results, treatment options, and if necessary, the need for more tests. Vaccines  Your health care provider may recommend certain vaccines, such as: Influenza vaccine. This is recommended every year. Tetanus, diphtheria, and acellular pertussis (Tdap, Td) vaccine. You may need a Td booster every 10 years. Zoster vaccine. You may  need this after age 43. Pneumococcal 13-valent conjugate (PCV13) vaccine. One dose is recommended after age 57. Pneumococcal polysaccharide (PPSV23) vaccine. One dose is recommended after age 7. Talk to your health care provider about which screenings and vaccines you need and how often you need them. This information is not intended to replace advice given to you by your health care provider. Make sure you discuss any questions you have with your health care provider. Document Released: 05/06/2015 Document Revised: 12/28/2015 Document Reviewed: 02/08/2015 Elsevier Interactive Patient Education  2017 [employer].  Fall Prevention in the Home Falls can cause injuries. They can happen to people of all ages. There are many things you can do to make your home safe and to help prevent falls. What  can I do on the outside of my home? Regularly fix the edges of walkways and driveways and fix any cracks. Remove anything that might make you trip as you walk through a door, such as a raised step or threshold. Trim any bushes or trees on the path to your home. Use bright outdoor lighting. Clear any walking paths of anything that might make someone trip, such as rocks or tools. Regularly check to see if handrails are loose or broken. Make sure that both sides of any steps have handrails. Any raised decks and porches should have guardrails on the edges. Have any leaves, snow, or ice cleared regularly. Use sand or salt on walking paths during winter. Clean up any spills in your garage right away. This includes oil or grease spills. What can I do in the bathroom? Use night lights. Install grab bars by the toilet and in the tub and shower. Do not use towel bars as grab bars. Use non-skid mats or decals in the tub or shower. If you need to sit down in the shower, use a plastic, non-slip stool. Keep the floor dry. Clean up any water that spills on the floor as soon as it happens. Remove soap buildup in  the tub or shower regularly. Attach bath mats securely with double-sided non-slip rug tape. Do not have throw rugs and other things on the floor that can make you trip. What can I do in the bedroom? Use night lights. Make sure that you have a light by your bed that is easy to reach. Do not use any sheets or blankets that are too big for your bed. They should not hang down onto the floor. Have a firm chair that has side arms. You can use this for support while you get dressed. Do not have throw rugs and other things on the floor that can make you trip. What can I do in the kitchen? Clean up any spills right away. Avoid walking on wet floors. Keep items that you use a lot in easy-to-reach places. If you need to reach something above you, use a strong step stool that has a grab bar. Keep electrical cords out of the way. Do not use floor polish or wax that makes floors slippery. If you must use wax, use non-skid floor wax. Do not have throw rugs and other things on the floor that can make you trip. What can I do with my stairs? Do not leave any items on the stairs. Make sure that there are handrails on both sides of the stairs and use them. Fix handrails that are broken or loose. Make sure that handrails are as long as the stairways. Check any carpeting to make sure that it is firmly attached to the stairs. Fix any carpet that is loose or worn. Avoid having throw rugs at the top or bottom of the stairs. If you do have throw rugs, attach them to the floor with carpet tape. Make sure that you have a light switch at the top of the stairs and the bottom of the stairs. If you do not have them, ask someone to add them for you. What else can I do to help prevent falls? Wear shoes that: Do not have high heels. Have rubber bottoms. Are comfortable and fit you well. Are closed at the toe. Do not wear sandals. If you use a stepladder: Make sure that it is fully opened. Do not climb a closed  stepladder. Make sure that both sides of  the stepladder are locked into place. Ask someone to hold it for you, if possible. Clearly mark and make sure that you can see: Any grab bars or handrails. First and last steps. Where the edge of each step is. Use tools that help you move around (mobility aids) if they are needed. These include: Canes. Walkers. Scooters. Crutches. Turn on the lights when you go into a dark area. Replace any light bulbs as soon as they burn out. Set up your furniture so you have a clear path. Avoid moving your furniture around. If any of your floors are uneven, fix them. If there are any pets around you, be aware of where they are. Review your medicines with your doctor. Some medicines can make you feel dizzy. This can increase your chance of falling. Ask your doctor what other things that you can do to help prevent falls. This information is not intended to replace advice given to you by your health care provider. Make sure you discuss any questions you have with your health care provider. Document Released: 02/03/2009 Document Revised: 09/15/2015 Document Reviewed: 05/14/2014 Elsevier Interactive Patient Education  2017 [employer].

## 2022-12-13 ENCOUNTER — Other Ambulatory Visit: Payer: Self-pay | Admitting: Family Medicine

## 2022-12-13 NOTE — Telephone Encounter (Signed)
Medication:  Directions: Take 1 tablet as needed for muscle spasms  Last given: 02/05/22 Number refills: 1 Last o/v: 04/18/22 Follow up: 01/14/23 Labs:

## 2022-12-14 NOTE — Telephone Encounter (Signed)
ERx 

## 2022-12-25 ENCOUNTER — Ambulatory Visit: Payer: Medicare Other | Admitting: Family Medicine

## 2022-12-25 DIAGNOSIS — E1169 Type 2 diabetes mellitus with other specified complication: Secondary | ICD-10-CM

## 2022-12-26 ENCOUNTER — Encounter: Payer: Self-pay | Admitting: Family Medicine

## 2023-01-04 ENCOUNTER — Encounter: Payer: Self-pay | Admitting: Family Medicine

## 2023-01-04 ENCOUNTER — Ambulatory Visit (INDEPENDENT_AMBULATORY_CARE_PROVIDER_SITE_OTHER): Payer: Medicare Other | Admitting: Family Medicine

## 2023-01-04 VITALS — BP 138/72 | HR 56 | Temp 97.5°F | Ht 67.0 in | Wt 225.5 lb

## 2023-01-04 DIAGNOSIS — I10 Essential (primary) hypertension: Secondary | ICD-10-CM

## 2023-01-04 DIAGNOSIS — Z7984 Long term (current) use of oral hypoglycemic drugs: Secondary | ICD-10-CM

## 2023-01-04 DIAGNOSIS — E1169 Type 2 diabetes mellitus with other specified complication: Secondary | ICD-10-CM

## 2023-01-04 DIAGNOSIS — R009 Unspecified abnormalities of heart beat: Secondary | ICD-10-CM | POA: Diagnosis not present

## 2023-01-04 DIAGNOSIS — R079 Chest pain, unspecified: Secondary | ICD-10-CM

## 2023-01-04 DIAGNOSIS — Z23 Encounter for immunization: Secondary | ICD-10-CM | POA: Diagnosis not present

## 2023-01-04 LAB — POCT GLYCOSYLATED HEMOGLOBIN (HGB A1C): Hemoglobin A1C: 7.3 % — AB (ref 4.0–5.6)

## 2023-01-04 NOTE — Patient Instructions (Addendum)
Flu shot today Good to see you today Return as needed or in 2-3 months for physical as you're due.  We will check labwork at next visit.

## 2023-01-04 NOTE — Assessment & Plan Note (Signed)
EKG showing ventricular trigeminy with frequent PVCs.  Pt denies palpitations.

## 2023-01-04 NOTE — Assessment & Plan Note (Signed)
Non-cardiac. Anticipate more related to recent slip out of bed straining L shoulder in h/o shoulder and neck surgeries.

## 2023-01-04 NOTE — Progress Notes (Signed)
Ph: [phone] Fax: [phone]   Patient ID: [name], male    DOB: [birth year], 72 y.o.   MRN: [id number]  This visit was conducted in person.  BP 138/72   Pulse (!) 56 Comment: manually checked  Temp (!) 97.5 F (36.4 C) (Temporal)   Ht 5\' 7"  (1.702 m)   Wt 225 lb 8 oz (102.3 kg)   SpO2 97%   BMI 35.32 kg/m    Orthostatic Vitals for the past 48 hrs (Last 6 readings):  Patient Position Orthostatic BP BP Pulse  01/04/23 0814 -- -- 138/72 (!) 24  01/04/23 0818 -- -- -- (!) 23  01/04/23 0834 Supine 160/80 -- --  01/04/23 0837 Standing 142/90 -- --  01/04/23 0843 -- -- -- (!) 56   CC: DM f/u visit Subjective:   HPI: [name] is a 72 y.o. male presenting on 01/04/2023 for Medical Management of Chronic Issues (Here for DM f/u.) and Chest Pain (C/o L side chest pain, occasional numbness and feeling of bloating in chest. Sxs started 2 wks ago. Also, c/o dizziness- especially when lying down and/or going to sitting up position. )   Saw health advisor 11/2022 for medicare wellness visit. Note reviewed.  Due for CPE - he will return for this.    2 wk h/o left sided upper chest discomfort associated with numbness to left arm and bloated feeling in chest. No jaw pain. Chronic neck pain with cervicogenic headache. He has had bilateral shoulder and neck surgery. He did have fall 2 wks ago - slipped out of bed, reached behind his back with left arm and pain has been worse since then.   Saw Dr Ethelene Hal 11/2022 - with steroid injection into the neck. May return to see Dr Shon Baton. Continues percocet pain medication through PM&R.   Last saw cardiology 07/2022 [facility]) - latest normal catheterization 08/2018. H/o non-cardiac chest pain. On coreg 6.25mg  bid with losartan 100mg  daily.   DM - does regularly check sugars - running "pretty good", notes hyperglycemia after eating frosted flakes. Compliant with antihyperglycemic regimen which includes: jardiance 10mg  daily with  metformin 1000mg  bid. Denies low sugars or hypoglycemic symptoms. Denies blurry vision. Notes paresthesias to left arm. Last diabetic eye exam 07/2022. Glucometer brand: one touch ultra. Last foot exam: DUE. DSME: declines - but would be interested in referral beginning of 2025 to [place]. Lab Results  Component Value Date   HGBA1C 7.3 (A) 01/04/2023   Diabetic Foot Exam - Simple   Simple Foot Form Diabetic Foot exam was performed with the following findings: Yes 01/04/2023  8:45 AM  Visual Inspection No deformities, no ulcerations, no other skin breakdown bilaterally: Yes Sensation Testing Intact to touch and monofilament testing bilaterally: Yes Pulse Check Posterior Tibialis and Dorsalis pulse intact bilaterally: Yes Comments No claudication    Lab Results  Component Value Date   MICROALBUR 15.3 (H) 10/17/2021        Relevant past medical, surgical, family and social history reviewed and updated as indicated. Interim medical history since our last visit reviewed. Allergies and medications reviewed and updated. Outpatient Medications Prior to Visit  Medication Sig Dispense Refill   ammonium lactate (AMLACTIN) 12 % cream Apply topically in the morning and at bedtime.     aspirin EC 81 MG tablet Take 81 mg by mouth daily.     atorvastatin (LIPITOR) 80 MG tablet TAKE 1 TABLET BY MOUTH ONCE DAILY 90 tablet 3   baclofen (LIORESAL) 10 MG tablet  TAKE 1 TABLET BY MOUTH AT  BEDTIME AS NEEDED FOR MUSCLE  SPASM 100 tablet 1   benzonatate (TESSALON) 100 MG capsule Take 1 capsule (100 mg total) by mouth every 8 (eight) hours. 21 capsule 0   carvedilol (COREG) 6.25 MG tablet Take 1 tablet (6.25 mg total) by mouth 2 (two) times daily with a meal. 90 tablet 3   clobetasol cream (TEMOVATE) 0.05 %      doxycycline (VIBRAMYCIN) 100 MG capsule Take 1 capsule (100 mg total) by mouth 2 (two) times daily. 20 capsule 0   empagliflozin (JARDIANCE) 10 MG TABS tablet Take 1 tablet (10 mg total) by mouth  daily. 90 tablet 3   fluticasone (FLONASE) 50 MCG/ACT nasal spray USE 2 SPRAYS IN BOTH  NOSTRILS DAILY 48 g 2   gabapentin (NEURONTIN) 100 MG capsule TAKE 1 CAPSULE BY MOUTH  TWICE DAILY 160 capsule 3   glucose blood (ONETOUCH ULTRA) test strip Use as instructed to check blood sugar 2 times a day 200 strip 3   guaiFENesin (MUCINEX) 600 MG 12 hr tablet Take 600 mg by mouth 2 (two) times daily as needed for cough.     losartan (COZAAR) 100 MG tablet Take 1 tablet (100 mg total) by mouth daily. 90 tablet 3   metFORMIN (GLUCOPHAGE) 1000 MG tablet TAKE 1 TABLET BY MOUTH TWICE  DAILY WITH A MEAL 200 tablet 0   omeprazole (PRILOSEC) 40 MG capsule TAKE 1 CAPSULE BY MOUTH  DAILY 90 capsule 3   oxyCODONE-acetaminophen (PERCOCET) 10-325 MG tablet Take 1 tablet by mouth every 4 (four) hours as needed for pain. (Patient taking differently: Take 1 tablet by mouth every 4 (four) hours.) 10 tablet 0   polyvinyl alcohol (LIQUIFILM TEARS) 1.4 % ophthalmic solution Place 1 drop into both eyes as needed for dry eyes.     sildenafil (VIAGRA) 100 MG tablet Take 0.5-1 tablets (50-100 mg total) by mouth as needed for erectile dysfunction. 10 tablet 1   tamsulosin (FLOMAX) 0.4 MG CAPS capsule Take 0.4 mg by mouth daily.     Facility-Administered Medications Prior to Visit  Medication Dose Route Frequency Provider Last Rate Last Admin   triamcinolone (KENALOG) 0.1 % cream   Topical BID Lenn Sink, DPM         Per HPI unless specifically indicated in ROS section below Review of Systems  Objective:  BP 138/72   Pulse (!) 56 Comment: manually checked  Temp (!) 97.5 F (36.4 C) (Temporal)   Ht 5\' 7"  (1.702 m)   Wt 225 lb 8 oz (102.3 kg)   SpO2 97%   BMI 35.32 kg/m   Wt Readings from Last 3 Encounters:  01/04/23 225 lb 8 oz (102.3 kg)  12/04/22 230 lb (104.3 kg)  07/26/22 236 lb (107 kg)      Physical Exam Vitals and nursing note reviewed.  Constitutional:      Appearance: Normal appearance. He is not  ill-appearing.  HENT:     Mouth/Throat:     Mouth: Mucous membranes are moist.     Pharynx: Oropharynx is clear. No oropharyngeal exudate or posterior oropharyngeal erythema.  Eyes:     Extraocular Movements: Extraocular movements intact.     Conjunctiva/sclera: Conjunctivae normal.     Pupils: Pupils are equal, round, and reactive to light.  Neck:     Comments: Limited ROM in flexion/extension and lateral rotation  Cardiovascular:     Rate and Rhythm: Normal rate and regular rhythm.  Pulses: Normal pulses.     Heart sounds: Normal heart sounds. No murmur heard. Pulmonary:     Effort: Pulmonary effort is normal. No respiratory distress.     Breath sounds: Normal breath sounds. No wheezing, rhonchi or rales.  Musculoskeletal:     Right lower leg: No edema.     Left lower leg: No edema.     Comments: See HPI for foot exam if done  Skin:    General: Skin is warm and dry.     Findings: No rash.  Neurological:     Mental Status: He is alert.  Psychiatric:        Mood and Affect: Mood normal.        Behavior: Behavior normal.       Results for orders placed or performed in visit on 01/04/23  POCT glycosylated hemoglobin (Hb A1C)  Result Value Ref Range   Hemoglobin A1C 7.3 (A) 4.0 - 5.6 %   HbA1c POC (<> result, manual entry)     HbA1c, POC (prediabetic range)     HbA1c, POC (controlled diabetic range)     EKG - NSR 70, ventricular bigeminy with frequent PVCs, normal axis, intervals, no hypertrophy or acute ST/T changes, q waves lead III and septally, overall similar to prior EKG 07/2022  Assessment & Plan:   Problem List Items Addressed This Visit     Type 2 diabetes mellitus with other specified complication (HCC) - Primary    Chronic, stable on current regimen.  Foot exam today. Encouraged healthy diet choices and continue walking routine to keep sugars under control.       Relevant Orders   POCT glycosylated hemoglobin (Hb A1C) (Completed)   Essential  hypertension    Chronic, stable. Continue current regimen.       Chest pain    Non-cardiac. Anticipate more related to recent slip out of bed straining L shoulder in h/o shoulder and neck surgeries.       Relevant Orders   EKG 12-Lead (Completed)   Abnormal heart rate    EKG showing ventricular trigeminy with frequent PVCs.  Pt denies palpitations.       Other Visit Diagnoses     Encounter for immunization       Relevant Orders   Flu Vaccine Trivalent High Dose (Fluad) (Completed)        No orders of the defined types were placed in this encounter.   Orders Placed This Encounter  Procedures   Flu Vaccine Trivalent High Dose (Fluad)   POCT glycosylated hemoglobin (Hb A1C)   EKG 12-Lead    Patient Instructions  Flu shot today Good to see you today Return as needed or in 2-3 months for physical as you're due.  We will check labwork at next visit.   Follow up plan: Return in about 3 months (around 04/05/2023) for annual exam, prior fasting for blood work.  Eustaquio Boyden, MD

## 2023-01-04 NOTE — Assessment & Plan Note (Signed)
Chronic, stable. Continue current regimen.

## 2023-01-04 NOTE — Assessment & Plan Note (Addendum)
Chronic, stable on current regimen.  Foot exam today. Encouraged healthy diet choices and continue walking routine to keep sugars under control.

## 2023-01-17 ENCOUNTER — Other Ambulatory Visit: Payer: Self-pay | Admitting: Family Medicine

## 2023-01-17 DIAGNOSIS — E1169 Type 2 diabetes mellitus with other specified complication: Secondary | ICD-10-CM

## 2023-01-17 DIAGNOSIS — K219 Gastro-esophageal reflux disease without esophagitis: Secondary | ICD-10-CM

## 2023-01-17 MED ORDER — METFORMIN HCL 1000 MG PO TABS: 1000.0000 mg | ORAL_TABLET | Freq: Two times a day (BID) | ORAL | 0 refills | Status: DC

## 2023-01-17 MED ORDER — ATORVASTATIN CALCIUM 80 MG PO TABS: 80.0000 mg | ORAL_TABLET | Freq: Every day | ORAL | 0 refills | Status: DC

## 2023-01-17 MED ORDER — OMEPRAZOLE 40 MG PO CPDR: DELAYED_RELEASE_CAPSULE | ORAL | 0 refills | Status: DC

## 2023-01-17 NOTE — Telephone Encounter (Signed)
Prescription Request  01/17/2023  LOV: 01/04/2023  What is the name of the medication or equipment? atorvastatin (LIPITOR) 80 MG tablet   gabapentin (NEURONTIN) 100 MG capsule   omeprazole (PRILOSEC) 40 MG capsule   metFORMIN (GLUCOPHAGE) 1000 MG tablet   Have you contacted your pharmacy to request a refill? Yes   Which pharmacy would you like this sent to?  [facility] - [place], [place] - [address] [place] [place] [address] Phone: [phone] Fax: [phone]   Patient notified that their request is being sent to the clinical staff for review and that they should receive a response within 2 business days.   Please advise at [facility] [phone]  Patient states that he is completely out of all of these medications.Spoke with [facility] 3 weeks ago and they said they sent request over but got no response.

## 2023-01-17 NOTE — Telephone Encounter (Signed)
Gabapentin Last rx:  01/24/22, #160 Last OV:  01/04/23, DM f/u Next OV:  04/05/23, CPE

## 2023-01-21 MED ORDER — GABAPENTIN 100 MG PO CAPS
ORAL_CAPSULE | ORAL | 3 refills | Status: DC
Start: 2023-01-21 — End: 2024-01-16

## 2023-01-21 NOTE — Telephone Encounter (Signed)
ERx 

## 2023-01-23 ENCOUNTER — Other Ambulatory Visit: Payer: Self-pay

## 2023-01-23 DIAGNOSIS — J309 Allergic rhinitis, unspecified: Secondary | ICD-10-CM

## 2023-01-23 MED ORDER — FLUTICASONE PROPIONATE 50 MCG/ACT NA SUSP: NASAL | 0 refills | Status: DC

## 2023-01-23 NOTE — Telephone Encounter (Signed)
E-scribed refill 

## 2023-01-27 ENCOUNTER — Other Ambulatory Visit: Payer: Self-pay | Admitting: Cardiovascular Disease

## 2023-01-27 DIAGNOSIS — I10 Essential (primary) hypertension: Secondary | ICD-10-CM

## 2023-02-11 DIAGNOSIS — H0102B Squamous blepharitis left eye, upper and lower eyelids: Secondary | ICD-10-CM | POA: Diagnosis not present

## 2023-02-11 DIAGNOSIS — H35372 Puckering of macula, left eye: Secondary | ICD-10-CM | POA: Diagnosis not present

## 2023-02-11 DIAGNOSIS — H353131 Nonexudative age-related macular degeneration, bilateral, early dry stage: Secondary | ICD-10-CM | POA: Diagnosis not present

## 2023-02-11 DIAGNOSIS — E119 Type 2 diabetes mellitus without complications: Secondary | ICD-10-CM | POA: Diagnosis not present

## 2023-02-11 DIAGNOSIS — H40013 Open angle with borderline findings, low risk, bilateral: Secondary | ICD-10-CM | POA: Diagnosis not present

## 2023-02-11 DIAGNOSIS — H1045 Other chronic allergic conjunctivitis: Secondary | ICD-10-CM | POA: Diagnosis not present

## 2023-02-11 DIAGNOSIS — Z961 Presence of intraocular lens: Secondary | ICD-10-CM | POA: Diagnosis not present

## 2023-02-11 DIAGNOSIS — H43811 Vitreous degeneration, right eye: Secondary | ICD-10-CM | POA: Diagnosis not present

## 2023-02-11 DIAGNOSIS — H0102A Squamous blepharitis right eye, upper and lower eyelids: Secondary | ICD-10-CM | POA: Diagnosis not present

## 2023-02-20 ENCOUNTER — Other Ambulatory Visit: Payer: Self-pay | Admitting: Cardiovascular Disease

## 2023-02-20 DIAGNOSIS — I10 Essential (primary) hypertension: Secondary | ICD-10-CM

## 2023-03-13 ENCOUNTER — Other Ambulatory Visit: Payer: Self-pay | Admitting: Pharmacist

## 2023-03-13 ENCOUNTER — Other Ambulatory Visit: Payer: Self-pay | Admitting: Family Medicine

## 2023-03-13 ENCOUNTER — Encounter: Payer: Self-pay | Admitting: Pharmacist

## 2023-03-13 DIAGNOSIS — E1169 Type 2 diabetes mellitus with other specified complication: Secondary | ICD-10-CM

## 2023-03-13 DIAGNOSIS — K219 Gastro-esophageal reflux disease without esophagitis: Secondary | ICD-10-CM

## 2023-03-13 MED ORDER — EMPAGLIFLOZIN 10 MG PO TABS: 10.0000 mg | ORAL_TABLET | Freq: Every day | ORAL | 3 refills | Status: DC

## 2023-03-13 NOTE — Progress Notes (Signed)
Received fax from [facility] regarding patient enrollment valid through 04/23/2023.  Jardiance 10 mg daily.   Renewal application filled out. Provider page placed in PCP inbox.  eRx pended for Jardiance 10 mg daily > [facility] (NPI [id number])   Patient will present to clinic for lab visit early December and will sign forms then.  Forms placed in patient folders in front office.   Patient lives in household of 1 with monthly income:  SSI: 1878 Pension: 102.22  Loree Fee, PharmD Clinical Pharmacist [facility] [phone]

## 2023-03-13 NOTE — Progress Notes (Signed)
ERx 

## 2023-03-19 ENCOUNTER — Telehealth: Payer: Self-pay

## 2023-03-19 NOTE — Telephone Encounter (Signed)
-----   Message from Loree Fee sent at 03/13/2023 11:24 AM EST ----- FYI - I have completed pt re-enrollment forms and will submit to [facility] cares and upload to chart upon signature(s). eRx pended to Md.  Medication: Jardiance 10 mg daily  Can we please add patient to Med Assistance table for future follow up/tracking?  Thank you!

## 2023-03-19 NOTE — Telephone Encounter (Signed)
PER PHARMACIST completed pt re-enrollment forms and will submit to [facility] cares and upload to chart upon signature(s). eRx pended to Md.  Medication: Jardiance 10 mg daily   PAP: Application for Jardiance has been submitted to PAP Companies: [employer], online PLEASE BE ADVISED

## 2023-03-19 NOTE — Progress Notes (Signed)
Pharmacy Medication Assistance Program Note    03/19/2023  Patient ID: [name], male   DOB: 10/17/1950, 72 y.o.   MRN: [id number]     03/19/2023  Outreach Medication One  Initial Outreach Date (Medication One) 03/13/2023  Manufacturer Medication One [employer]  [employer] Drugs Jardiance  Dose of Jardiance 10  Type of Radiographer, therapeutic Assistance  Date Barista to Patient --  Name of Prescriber Wynona Canes, [employer]

## 2023-03-24 ENCOUNTER — Other Ambulatory Visit: Payer: Self-pay | Admitting: Family Medicine

## 2023-03-24 DIAGNOSIS — D509 Iron deficiency anemia, unspecified: Secondary | ICD-10-CM

## 2023-03-24 DIAGNOSIS — E1169 Type 2 diabetes mellitus with other specified complication: Secondary | ICD-10-CM

## 2023-03-24 DIAGNOSIS — C61 Malignant neoplasm of prostate: Secondary | ICD-10-CM

## 2023-03-25 DIAGNOSIS — R3912 Poor urinary stream: Secondary | ICD-10-CM | POA: Diagnosis not present

## 2023-03-27 ENCOUNTER — Other Ambulatory Visit: Payer: Self-pay | Admitting: Family Medicine

## 2023-03-27 DIAGNOSIS — E1169 Type 2 diabetes mellitus with other specified complication: Secondary | ICD-10-CM

## 2023-03-28 DIAGNOSIS — M5412 Radiculopathy, cervical region: Secondary | ICD-10-CM | POA: Diagnosis not present

## 2023-03-28 DIAGNOSIS — M5030 Other cervical disc degeneration, unspecified cervical region: Secondary | ICD-10-CM | POA: Diagnosis not present

## 2023-03-28 DIAGNOSIS — M961 Postlaminectomy syndrome, not elsewhere classified: Secondary | ICD-10-CM | POA: Diagnosis not present

## 2023-03-29 ENCOUNTER — Other Ambulatory Visit (INDEPENDENT_AMBULATORY_CARE_PROVIDER_SITE_OTHER): Payer: Medicare Other

## 2023-03-29 DIAGNOSIS — C61 Malignant neoplasm of prostate: Secondary | ICD-10-CM

## 2023-03-29 DIAGNOSIS — E1169 Type 2 diabetes mellitus with other specified complication: Secondary | ICD-10-CM | POA: Diagnosis not present

## 2023-03-29 DIAGNOSIS — E785 Hyperlipidemia, unspecified: Secondary | ICD-10-CM | POA: Diagnosis not present

## 2023-03-29 DIAGNOSIS — D509 Iron deficiency anemia, unspecified: Secondary | ICD-10-CM

## 2023-03-29 LAB — LIPID PANEL
Cholesterol: 121 mg/dL (ref 0–200)
HDL: 31.7 mg/dL — ABNORMAL LOW (ref >39.00)
LDL Cholesterol: 72 mg/dL (ref 0–99)
NonHDL: 89.47
Total CHOL/HDL Ratio: 4
Triglycerides: 85 mg/dL (ref 0.0–149.0)
VLDL: 17 mg/dL (ref 0.0–40.0)

## 2023-03-29 LAB — COMPREHENSIVE METABOLIC PANEL
ALT: 19 U/L (ref 0–53)
AST: 9 U/L (ref 0–37)
Albumin: 4.3 g/dL (ref 3.5–5.2)
Alkaline Phosphatase: 51 U/L (ref 39–117)
BUN: 10 mg/dL (ref 6–23)
CO2: 29 meq/L (ref 19–32)
Calcium: 9.1 mg/dL (ref 8.4–10.5)
Chloride: 98 meq/L (ref 96–112)
Creatinine, Ser: 0.95 mg/dL (ref 0.40–1.50)
GFR: 79.63 mL/min (ref >60.00)
Glucose, Bld: 189 mg/dL — ABNORMAL HIGH (ref 70–99)
Potassium: 4.3 meq/L (ref 3.5–5.1)
Sodium: 137 meq/L (ref 135–145)
Total Bilirubin: 0.5 mg/dL (ref 0.2–1.2)
Total Protein: 7.4 g/dL (ref 6.0–8.3)

## 2023-03-29 LAB — CBC WITH DIFFERENTIAL/PLATELET
Basophils Absolute: 0 10*3/uL (ref 0.0–0.1)
Basophils Relative: 0.6 % (ref 0.0–3.0)
Eosinophils Absolute: 0.1 10*3/uL (ref 0.0–0.7)
Eosinophils Relative: 1.7 % (ref 0.0–5.0)
HCT: 43 % (ref 39.0–52.0)
Hemoglobin: 15.2 g/dL (ref 13.0–17.0)
Lymphocytes Relative: 22.6 % (ref 12.0–46.0)
Lymphs Abs: 1.3 10*3/uL (ref 0.7–4.0)
MCHC: 35.3 g/dL (ref 30.0–36.0)
MCV: 94.7 fL (ref 78.0–100.0)
Monocytes Absolute: 0.7 10*3/uL (ref 0.1–1.0)
Monocytes Relative: 12.2 % — ABNORMAL HIGH (ref 3.0–12.0)
Neutro Abs: 3.7 10*3/uL (ref 1.4–7.7)
Neutrophils Relative %: 62.9 % (ref 43.0–77.0)
Platelets: 236 10*3/uL (ref 150.0–400.0)
RBC: 4.54 Mil/uL (ref 4.22–5.81)
RDW: 14.3 % (ref 11.5–15.5)
WBC: 5.9 10*3/uL (ref 4.0–10.5)

## 2023-03-29 LAB — IBC PANEL
Iron: 202 ug/dL — ABNORMAL HIGH (ref 42–165)
Saturation Ratios: 49.8 % (ref 20.0–50.0)
TIBC: 406 ug/dL (ref 250.0–450.0)
Transferrin: 290 mg/dL (ref 212.0–360.0)

## 2023-03-29 LAB — MICROALBUMIN / CREATININE URINE RATIO
Creatinine,U: 84.2 mg/dL
Microalb Creat Ratio: 13.5 mg/g (ref 0.0–30.0)
Microalb, Ur: 11.3 mg/dL — ABNORMAL HIGH (ref 0.0–1.9)

## 2023-03-29 LAB — VITAMIN B12: Vitamin B-12: 396 pg/mL (ref 211–911)

## 2023-03-29 LAB — FERRITIN: Ferritin: 46.3 ng/mL (ref 22.0–322.0)

## 2023-03-29 LAB — HEMOGLOBIN A1C: Hgb A1c MFr Bld: 8.4 % — ABNORMAL HIGH (ref 4.6–6.5)

## 2023-03-29 LAB — PSA: PSA: 0.08 ng/mL — ABNORMAL LOW (ref 0.10–4.00)

## 2023-04-02 ENCOUNTER — Other Ambulatory Visit: Payer: Self-pay | Admitting: Family Medicine

## 2023-04-02 DIAGNOSIS — J309 Allergic rhinitis, unspecified: Secondary | ICD-10-CM

## 2023-04-03 ENCOUNTER — Encounter: Payer: Self-pay | Admitting: Pharmacist

## 2023-04-03 NOTE — Progress Notes (Addendum)
Manufacturer Assistance Program (MAP) Application   Manufacturer: [employer] ([employer]) Medication(s): Jardiance (renewal) Patient Portion of Application:  04/03/23: Filled out and placed in front office for patient signature.  Signed forms returned to pharmacist.   Provider Portion of Application:  Provider portion placed in PCP inbox for signature. 12/11: Signed forms returned to pharmacist Prescription(s): Electronic Rx sent to [facility] ([facility]) - Pended to PCP 04/03/23  Application Faxed to:  [employer] Fax: [phone] by Berenice Primas with confirmation.  Date: 04/03/23 APPROVED - through 04/22/2024 Patient ID: [id number]  Application Status: Submitted 04/03/23 Forwarded to [facility] CPhT Patient Advocate Team who has been working with patient on enrollment

## 2023-04-05 ENCOUNTER — Ambulatory Visit: Payer: Medicare Other | Admitting: Family Medicine

## 2023-04-05 ENCOUNTER — Encounter: Payer: Self-pay | Admitting: Family Medicine

## 2023-04-05 VITALS — BP 128/74 | HR 78 | Temp 98.7°F | Ht 67.25 in | Wt 230.0 lb

## 2023-04-05 DIAGNOSIS — E785 Hyperlipidemia, unspecified: Secondary | ICD-10-CM | POA: Diagnosis not present

## 2023-04-05 DIAGNOSIS — K760 Fatty (change of) liver, not elsewhere classified: Secondary | ICD-10-CM

## 2023-04-05 DIAGNOSIS — G894 Chronic pain syndrome: Secondary | ICD-10-CM

## 2023-04-05 DIAGNOSIS — Z6835 Body mass index (BMI) 35.0-35.9, adult: Secondary | ICD-10-CM

## 2023-04-05 DIAGNOSIS — Z0000 Encounter for general adult medical examination without abnormal findings: Secondary | ICD-10-CM

## 2023-04-05 DIAGNOSIS — K219 Gastro-esophageal reflux disease without esophagitis: Secondary | ICD-10-CM | POA: Diagnosis not present

## 2023-04-05 DIAGNOSIS — Z7189 Other specified counseling: Secondary | ICD-10-CM

## 2023-04-05 DIAGNOSIS — E1169 Type 2 diabetes mellitus with other specified complication: Secondary | ICD-10-CM

## 2023-04-05 DIAGNOSIS — I77810 Thoracic aortic ectasia: Secondary | ICD-10-CM

## 2023-04-05 DIAGNOSIS — C61 Malignant neoplasm of prostate: Secondary | ICD-10-CM

## 2023-04-05 DIAGNOSIS — D509 Iron deficiency anemia, unspecified: Secondary | ICD-10-CM

## 2023-04-05 DIAGNOSIS — I10 Essential (primary) hypertension: Secondary | ICD-10-CM

## 2023-04-05 DIAGNOSIS — E1142 Type 2 diabetes mellitus with diabetic polyneuropathy: Secondary | ICD-10-CM | POA: Diagnosis not present

## 2023-04-05 DIAGNOSIS — Z7984 Long term (current) use of oral hypoglycemic drugs: Secondary | ICD-10-CM

## 2023-04-05 DIAGNOSIS — F341 Dysthymic disorder: Secondary | ICD-10-CM

## 2023-04-05 DIAGNOSIS — I2510 Atherosclerotic heart disease of native coronary artery without angina pectoris: Secondary | ICD-10-CM

## 2023-04-05 MED ORDER — OZEMPIC (0.25 OR 0.5 MG/DOSE) 2 MG/3ML ~~LOC~~ SOPN: 0.2500 mg | PEN_INJECTOR | SUBCUTANEOUS | 0 refills | Status: DC

## 2023-04-05 MED ORDER — OZEMPIC (0.25 OR 0.5 MG/DOSE) 2 MG/1.5ML ~~LOC~~ SOPN: 0.5000 mg | PEN_INJECTOR | SUBCUTANEOUS | 3 refills | Status: DC

## 2023-04-05 NOTE — Patient Instructions (Addendum)
Price out ozempic weekly shot for diabetes. Start 0.25mg  once weekly for the first month then increase to 0.5mg  weekly. Continue metformin and jardiance. If interested, check with pharmacy about new 2 shot shingles series (shingrix).  Bring [place] a copy of your advanced directive.  Return in 3 months for diabetes follow up visit.  Good to see you today

## 2023-04-05 NOTE — Assessment & Plan Note (Signed)
Advanced directive - daughter Everardo Pacific is HCPOA. Wants to be cremated. Would not want prolonged life support if terminal condition. Canon with compressions/CPR. Does not have advanced directive. Handout provided today. Has discussed his desires with daughter.
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
## 2023-04-05 NOTE — Assessment & Plan Note (Signed)
Preventative protocols reviewed and updated unless pt declined. Discussed healthy diet and lifestyle.  

## 2023-04-08 ENCOUNTER — Encounter: Payer: Self-pay | Admitting: Family Medicine

## 2023-04-08 DIAGNOSIS — G894 Chronic pain syndrome: Secondary | ICD-10-CM | POA: Insufficient documentation

## 2023-04-08 NOTE — Assessment & Plan Note (Signed)
Chronic, stable. Continue current regimen through cardiology.

## 2023-04-08 NOTE — Assessment & Plan Note (Signed)
Anemia is resolved, iron levels now high - stay off oral iron.

## 2023-04-08 NOTE — Assessment & Plan Note (Signed)
Continue daily PPI.  

## 2023-04-08 NOTE — Assessment & Plan Note (Signed)
Chronic, stable, continue atorvastatin 80mg  daily. The ASCVD Risk score (Arnett DK, et al., 2019) failed to calculate for the following reasons:   The valid total cholesterol range is 130 to 320 mg/dL

## 2023-04-08 NOTE — Assessment & Plan Note (Addendum)
Chronic, deteriorated control.  Continue metformin 1000mg  BID and Jardiance 10mg  daily.  Glipizide XL previously stopped duet to hypoglycemia Discussed GLP1RA option, reviewed mechanism of action of medication as well as side effects and adverse events to watch for including nausea, diarrhea, constipation, pancreatitis. No fmhx medullary thyroid cancer or MEN2. Discussed titration schedule for medication. Will start Ozempic 0.25mg  weekly x 1 month then 0.5mg  weekly. RTC 3 mo DM f/u visit

## 2023-04-08 NOTE — Assessment & Plan Note (Signed)
Followed by cardiology 

## 2023-04-08 NOTE — Assessment & Plan Note (Signed)
Continue to encourage healthy diet and lifestyle choices to affect sustainable weight loss. a

## 2023-04-08 NOTE — Assessment & Plan Note (Signed)
Overall stable period off medication 

## 2023-04-08 NOTE — Assessment & Plan Note (Signed)
Continues low dose gabapentin

## 2023-04-08 NOTE — Assessment & Plan Note (Signed)
Regularly sees urology ,appreciate their care. Recent PSA near 0

## 2023-04-30 DIAGNOSIS — M5412 Radiculopathy, cervical region: Secondary | ICD-10-CM | POA: Diagnosis not present

## 2023-05-01 ENCOUNTER — Telehealth: Payer: Self-pay | Admitting: Family Medicine

## 2023-05-01 MED ORDER — OZEMPIC (0.25 OR 0.5 MG/DOSE) 2 MG/3ML ~~LOC~~ SOPN: 0.2500 mg | PEN_INJECTOR | SUBCUTANEOUS | 0 refills | Status: DC

## 2023-05-01 MED ORDER — OZEMPIC (0.25 OR 0.5 MG/DOSE) 2 MG/1.5ML ~~LOC~~ SOPN: 0.5000 mg | PEN_INJECTOR | SUBCUTANEOUS | 3 refills | Status: DC

## 2023-05-01 NOTE — Telephone Encounter (Addendum)
 ERx - both 0.25mg  and 0.5mg  doses. Let us know if also unaffordable.

## 2023-05-01 NOTE — Telephone Encounter (Signed)
 Spoke with [name] relaying Dr Timoteo Expose message. [name] verbalizes understanding, expresses his thanks and will let us know if cost is doable or not.

## 2023-05-01 NOTE — Addendum Note (Signed)
 Addended by: Eustaquio Boyden on: 05/01/2023 03:24 PM   Modules accepted: Orders

## 2023-05-01 NOTE — Telephone Encounter (Signed)
 Copied from CRM [id number]. Topic: Clinical - Prescription Issue >> May 01, 2023  2:32 PM Taleah C wrote: Reason for CRM: pt called and stated that his prescription for Ozempic  [facility]OP cost was too high at almost $100. He wanted to ask if Dr. [name] could route the prescription to [facility] instead to see if the cost may be cheaper. Please call and advise with patient.

## 2023-05-03 ENCOUNTER — Telehealth: Payer: Self-pay | Admitting: Family Medicine

## 2023-05-03 NOTE — Telephone Encounter (Addendum)
 Rtn pt's call. Pt states, for now, he will go ahead and get Ozempic  rx at [facility] because it's cheaper (see 05/01/23 phn note for reference). Says he will call them back and ask them to fill it. Pt will call back if for some reason he changes his mind. Fyi to Dr KANDICE.

## 2023-05-03 NOTE — Telephone Encounter (Signed)
 Copied from CRM [id number]. Topic: Clinical - Prescription Issue >> May 03, 2023 10:39 AM Maisie BROCKS wrote: Reason for CRM: Pt called back and stated that he spoke with [facility] and they quoted him $400 for a 4 month supply and $300 for a one month supply. He asked to speak with his nurse or Dr. KANDICE for clarification on what he meant by cheaper. Please call and advise with pt.

## 2023-05-06 ENCOUNTER — Telehealth: Payer: Self-pay

## 2023-05-06 ENCOUNTER — Ambulatory Visit: Payer: Medicare Other | Admitting: Podiatry

## 2023-05-06 NOTE — Telephone Encounter (Signed)
 Received following information form mail order Per chart review looks like has has been sent to local pharamcy no further action needed at this time.

## 2023-05-17 DIAGNOSIS — M542 Cervicalgia: Secondary | ICD-10-CM | POA: Diagnosis not present

## 2023-05-22 ENCOUNTER — Ambulatory Visit: Payer: Medicare Other | Admitting: Podiatry

## 2023-05-22 DIAGNOSIS — M542 Cervicalgia: Secondary | ICD-10-CM | POA: Diagnosis not present

## 2023-05-23 NOTE — Telephone Encounter (Signed)
PAP: Patient assistance application for [place] has been approved by PAP Companies: [employer] from 05/22/2023 to 04/22/2024. Medication should be delivered to PAP Delivery: Home. For further shipping updates, please contact [employer] ([employer]) at [phone]. Patient ID is: NO ID  PLEASE BE ADVISED

## 2023-05-24 ENCOUNTER — Ambulatory Visit: Payer: Medicare Other | Admitting: Podiatry

## 2023-05-24 DIAGNOSIS — M79675 Pain in left toe(s): Secondary | ICD-10-CM

## 2023-05-24 DIAGNOSIS — B351 Tinea unguium: Secondary | ICD-10-CM

## 2023-05-24 DIAGNOSIS — M79674 Pain in right toe(s): Secondary | ICD-10-CM | POA: Diagnosis not present

## 2023-05-24 NOTE — Progress Notes (Signed)
  Subjective:  Patient ID: [name], male    DOB: 04-18-1951,  MRN: [id number]  Chief Complaint  Patient presents with   [facility]    [facility] no calous, Last A1c 8.3 on 03/29/23.  Takes ASA 67   73 y.o. male returns for the above complaint.  Patient presents with thickened elongated dystrophic toenails x10.  Patient states is painful to touch.  Patient would like to have them debrided down.  He denies any other acute complaints.  He is having hard time to debride his own toenails.  Objective:   There were no vitals filed for this visit.  Podiatric Exam: Vascular: dorsalis pedis and posterior tibial pulses are palpable bilateral. Capillary return is immediate. Temperature gradient is WNL. Skin turgor WNL  Sensorium: Normal Semmes Weinstein monofilament test. Normal tactile sensation bilaterally. Nail Exam: Pt has thick disfigured discolored nails with subungual debris noted bilateral entire nail hallux through fifth toenails.  Pain on palpation to the nails. Ulcer Exam: There is no evidence of ulcer or pre-ulcerative changes or infection. Orthopedic Exam: Muscle tone and strength are WNL. No limitations in general ROM. No crepitus or effusions noted. HAV  B/L.  Hammer toes 2-5  B/L. Skin: No Porokeratosis. No infection or ulcers.  Xerosis improving.Marland Kitchen  No fissuring noted no clinical signs of infection noted    Assessment & Plan:   No diagnosis found.     Patient was evaluated and treated and all questions answered.  Xerosis bilateral lower extremity -Clinically improving.  I have asked him to continue applying ammonium lactate lotion twice a day.  He states understanding.  Onychomycosis with pain  -Nails palliatively debrided as below. -Educated on self-care  Procedure: Nail Debridement Rationale: pain  Type of Debridement: manual, sharp debridement. Instrumentation: Nail nipper, rotary burr. Number of Nails: 10  Procedures and Treatment: Consent by patient was obtained for  treatment procedures. The patient understood the discussion of treatment and procedures well. All questions were answered thoroughly reviewed. Debridement of mycotic and hypertrophic toenails, 1 through 5 bilateral and clearing of subungual debris. No ulceration, no infection noted.  Return Visit-Office Procedure: Patient instructed to return to the office for a follow up visit 3 months for continued evaluation and treatment.  Nicholes Rough, DPM    No follow-ups on file.

## 2023-06-10 ENCOUNTER — Other Ambulatory Visit: Payer: Self-pay | Admitting: Cardiovascular Disease

## 2023-06-13 ENCOUNTER — Telehealth: Payer: Self-pay

## 2023-06-13 NOTE — Telephone Encounter (Signed)
 Patient was identified as falling into the [place] Measure - Diabetes.   Patient was: Appointment scheduled with primary care provider in the next 30 days.

## 2023-06-16 ENCOUNTER — Emergency Department (HOSPITAL_COMMUNITY): Payer: Medicare Other

## 2023-06-16 ENCOUNTER — Other Ambulatory Visit: Payer: Self-pay

## 2023-06-16 ENCOUNTER — Emergency Department (HOSPITAL_COMMUNITY)
Admission: EM | Admit: 2023-06-16 | Discharge: 2023-06-16 | Disposition: A | Discharge status: Home / Self Care | Payer: Medicare Other | Attending: Emergency Medicine | Admitting: Emergency Medicine

## 2023-06-16 ENCOUNTER — Encounter (HOSPITAL_COMMUNITY): Payer: Self-pay | Admitting: Emergency Medicine

## 2023-06-16 DIAGNOSIS — J111 Influenza due to unidentified influenza virus with other respiratory manifestations: Secondary | ICD-10-CM

## 2023-06-16 DIAGNOSIS — R059 Cough, unspecified: Secondary | ICD-10-CM | POA: Diagnosis present

## 2023-06-16 DIAGNOSIS — J101 Influenza due to other identified influenza virus with other respiratory manifestations: Secondary | ICD-10-CM | POA: Diagnosis not present

## 2023-06-16 DIAGNOSIS — Z7982 Long term (current) use of aspirin: Secondary | ICD-10-CM | POA: Diagnosis not present

## 2023-06-16 DIAGNOSIS — R6889 Other general symptoms and signs: Secondary | ICD-10-CM | POA: Diagnosis not present

## 2023-06-16 LAB — COMPREHENSIVE METABOLIC PANEL
ALT: 22 U/L (ref 0–44)
AST: 17 U/L (ref 15–41)
Albumin: 3.9 g/dL (ref 3.5–5.0)
Alkaline Phosphatase: 40 U/L (ref 38–126)
Anion gap: 12 (ref 5–15)
BUN: 13 mg/dL (ref 8–23)
CO2: 22 mmol/L (ref 22–32)
Calcium: 8.7 mg/dL — ABNORMAL LOW (ref 8.9–10.3)
Chloride: 99 mmol/L (ref 98–111)
Creatinine, Ser: 0.9 mg/dL (ref 0.61–1.24)
GFR, Estimated: >60 mL/min (ref >60)
Glucose, Bld: 158 mg/dL — ABNORMAL HIGH (ref 70–99)
Potassium: 3.7 mmol/L (ref 3.5–5.1)
Sodium: 133 mmol/L — ABNORMAL LOW (ref 135–145)
Total Bilirubin: 0.7 mg/dL (ref 0.0–1.2)
Total Protein: 7.5 g/dL (ref 6.5–8.1)

## 2023-06-16 LAB — CBC WITH DIFFERENTIAL/PLATELET
Abs Immature Granulocytes: 0.02 10*3/uL (ref 0.00–0.07)
Basophils Absolute: 0 10*3/uL (ref 0.0–0.1)
Basophils Relative: 0 %
Eosinophils Absolute: 0 10*3/uL (ref 0.0–0.5)
Eosinophils Relative: 1 %
HCT: 41.3 % (ref 39.0–52.0)
Hemoglobin: 13.7 g/dL (ref 13.0–17.0)
Immature Granulocytes: 0 %
Lymphocytes Relative: 13 %
Lymphs Abs: 0.8 10*3/uL (ref 0.7–4.0)
MCH: 30.4 pg (ref 26.0–34.0)
MCHC: 33.2 g/dL (ref 30.0–36.0)
MCV: 91.6 fL (ref 80.0–100.0)
Monocytes Absolute: 0.9 10*3/uL (ref 0.1–1.0)
Monocytes Relative: 14 %
Neutro Abs: 4.6 10*3/uL (ref 1.7–7.7)
Neutrophils Relative %: 72 %
Platelets: 205 10*3/uL (ref 150–400)
RBC: 4.51 MIL/uL (ref 4.22–5.81)
RDW: 14.8 % (ref 11.5–15.5)
WBC: 6.3 10*3/uL (ref 4.0–10.5)
nRBC: 0 % (ref 0.0–0.2)

## 2023-06-16 LAB — RESP PANEL BY RT-PCR (RSV, FLU A&B, COVID)  RVPGX2
Influenza A by PCR: POSITIVE — AB
Influenza B by PCR: NEGATIVE
Resp Syncytial Virus by PCR: NEGATIVE
SARS Coronavirus 2 by RT PCR: NEGATIVE

## 2023-06-16 MED ORDER — BENZONATATE 100 MG PO CAPS: 100.0000 mg | ORAL_CAPSULE | Freq: Three times a day (TID) | ORAL | 0 refills | Status: AC

## 2023-06-16 MED ORDER — ACETAMINOPHEN 325 MG PO TABS
650.0000 mg | ORAL_TABLET | Freq: Once | ORAL | Status: AC | PRN
  Administered 2023-06-16: 650 mg via ORAL
  Filled 2023-06-16: qty 2

## 2023-06-16 NOTE — ED Provider Notes (Signed)
 [place] EMERGENCY DEPARTMENT AT [facility] Provider Note   CSN: [id number] Arrival date & time: 06/16/23  4098     History  Chief Complaint  Patient presents with   Cough   Headache    [name] is a 73 y.o. male.  Patient is a 73 year old male who presents with cough.  He states he started having symptoms about 3 weeks ago.  He had some runny nose congestion and coughing.  He has felt hot but has not had a documented temperature.  He had some diarrhea when it first started but that has resolved.  He now mostly has coughing.  He still has a little bit of nasal congestion.  He says the cough makes his chest hurt and his shoulders hurt and his neck hurts.  He occasionally will have some shortness of breath but not persistent.  No leg swelling.  No history of underlying lung disease.  He does not use inhalers.       Home Medications Prior to Admission medications   Medication Sig Start Date End Date Taking? Authorizing Provider  benzonatate (TESSALON) 100 MG capsule Take 1 capsule (100 mg total) by mouth every 8 (eight) hours. 06/16/23  Yes Rolan Bucco, MD  ammonium lactate (AMLACTIN) 12 % cream Apply topically in the morning and at bedtime.    [provider]  aspirin EC 81 MG tablet Take 81 mg by mouth daily. 10/25/11   Leroy Sea, MD  atorvastatin (LIPITOR) 80 MG tablet TAKE 1 TABLET BY MOUTH DAILY 03/15/23   Eustaquio Boyden, MD  baclofen (LIORESAL) 10 MG tablet TAKE 1 TABLET BY MOUTH AT  BEDTIME AS NEEDED FOR MUSCLE  SPASM 12/14/22   Eustaquio Boyden, MD  carvedilol (COREG) 6.25 MG tablet TAKE 1 TABLET BY MOUTH TWICE  DAILY WITH A MEAL 02/21/23   Wendall Stade, MD  clobetasol cream (TEMOVATE) 0.05 %  04/03/22   [provider]  doxycycline (VIBRAMYCIN) 100 MG capsule Take 1 capsule (100 mg total) by mouth 2 (two) times daily. 04/13/21   Pollyann Savoy, MD  empagliflozin (JARDIANCE) 10 MG TABS tablet Take 1 tablet (10 mg  total) by mouth daily. 03/13/23   Eustaquio Boyden, MD  fluticasone [facility]) 50 MCG/ACT nasal spray USE 2 SPRAYS IN BOTH NOSTRILS  DAILY 04/02/23   Eustaquio Boyden, MD  gabapentin (NEURONTIN) 100 MG capsule TAKE 1 CAPSULE BY MOUTH  TWICE DAILY 01/21/23   Eustaquio Boyden, MD  glucose blood (ONETOUCH ULTRA) test strip Use as instructed to check blood sugar 2 times a day 10/03/22   Eustaquio Boyden, MD  guaiFENesin (MUCINEX) 600 MG 12 hr tablet Take 600 mg by mouth 2 (two) times daily as needed for cough.    [provider]  losartan (COZAAR) 100 MG tablet TAKE 1 TABLET BY MOUTH DAILY 06/11/23   Wendall Stade, MD  metFORMIN (GLUCOPHAGE) 1000 MG tablet TAKE 1 TABLET BY MOUTH TWICE  DAILY WITH MEALS 03/27/23   Eustaquio Boyden, MD  omeprazole (PRILOSEC) 40 MG capsule TAKE 1 CAPSULE BY MOUTH DAILY 03/15/23   Eustaquio Boyden, MD  oxyCODONE-acetaminophen (PERCOCET) 10-325 MG tablet Take 1 tablet by mouth every 4 (four) hours as needed for pain. Patient taking differently: Take 1 tablet by mouth every 4 (four) hours. 07/03/19   Crist Fat, MD  polyvinyl alcohol (LIQUIFILM TEARS) 1.4 % ophthalmic solution Place 1 drop into both eyes as needed for dry eyes.    [provider]  Semaglutide,0.25  or 0.5MG /DOS, (OZEMPIC, 0.25 OR 0.5 MG/DOSE,) 2 MG/1.5ML SOPN Inject 0.5 mg into the skin once a week. 05/29/23   Eustaquio Boyden, MD  Semaglutide,0.25 or 0.5MG /DOS, (OZEMPIC, 0.25 OR 0.5 MG/DOSE,) 2 MG/3ML SOPN Inject 0.25 mg into the skin once a week. 05/01/23   Eustaquio Boyden, MD  sildenafil (VIAGRA) 100 MG tablet Take 0.5-1 tablets (50-100 mg total) by mouth as needed for erectile dysfunction. 03/09/22   Eustaquio Boyden, MD  tamsulosin (FLOMAX) 0.4 MG CAPS capsule Take 0.4 mg by mouth daily. 03/13/21   [provider]      Allergies    Glimepiride and Menthol (topical analgesic)    Review of Systems   Review of Systems  Constitutional:  Positive for fatigue. Negative  for chills, diaphoresis and fever.  HENT:  Positive for congestion and rhinorrhea. Negative for sneezing.   Eyes: Negative.   Respiratory:  Positive for cough and shortness of breath. Negative for chest tightness.   Cardiovascular:  Negative for chest pain and leg swelling.  Gastrointestinal:  Negative for abdominal pain, blood in stool, diarrhea, nausea and vomiting.  Genitourinary:  Negative for difficulty urinating, flank pain, frequency and hematuria.  Musculoskeletal:  Positive for myalgias. Negative for arthralgias and back pain.  Skin:  Negative for rash.  Neurological:  Negative for dizziness, speech difficulty, weakness, numbness and headaches.    Physical Exam Updated Vital Signs BP (!) 145/118   Pulse 89   Temp 99.3 F (37.4 C) (Oral)   Resp 18   Ht 5\' 7"  (1.702 m)   Wt 104.3 kg   SpO2 100%   BMI 36.02 kg/m  Physical Exam Constitutional:      Appearance: He is well-developed.  HENT:     Head: Normocephalic and atraumatic.     Right Ear: Tympanic membrane normal.     Left Ear: Tympanic membrane normal.     Mouth/Throat:     Mouth: Mucous membranes are moist.     Pharynx: No oropharyngeal exudate or posterior oropharyngeal erythema.  Eyes:     Pupils: Pupils are equal, round, and reactive to light.  Cardiovascular:     Rate and Rhythm: Normal rate and regular rhythm.     Heart sounds: Normal heart sounds.  Pulmonary:     Effort: Pulmonary effort is normal. No respiratory distress.     Breath sounds: Normal breath sounds. No wheezing or rales.  Chest:     Chest wall: No tenderness.  Abdominal:     General: Bowel sounds are normal.     Palpations: Abdomen is soft.     Tenderness: There is no abdominal tenderness. There is no guarding or rebound.  Musculoskeletal:        General: Normal range of motion.     Cervical back: Normal range of motion and neck supple.  Lymphadenopathy:     Cervical: No cervical adenopathy.  Skin:    General: Skin is warm and dry.      Findings: No rash.  Neurological:     Mental Status: He is alert and oriented to person, place, and time.     ED Results / Procedures / Treatments   Labs (all labs ordered are listed, but only abnormal results are displayed) Labs Reviewed  RESP PANEL BY RT-PCR (RSV, FLU A&B, COVID)  [id number] - Abnormal; Notable for the following components:      Result Value   Influenza A by PCR POSITIVE (*)    All other components within normal limits  COMPREHENSIVE  METABOLIC PANEL - Abnormal; Notable for the following components:   Sodium 133 (*)    Glucose, Bld 158 (*)    Calcium 8.7 (*)    All other components within normal limits  CBC WITH DIFFERENTIAL/PLATELET    EKG None  Radiology DG Chest Port 1 View Result Date: 06/16/2023 CLINICAL DATA:  Flu-like symptoms. EXAM: PORTABLE CHEST 1 VIEW COMPARISON:  04/13/2021 FINDINGS: The lungs are clear without focal pneumonia, edema, pneumothorax or pleural effusion. The cardiopericardial silhouette is within normal limits for size. No acute bony abnormality. IMPRESSION: No active disease. Electronically Signed   By: Kennith Center M.D.   On: 06/16/2023 07:48    Procedures Procedures    Medications Ordered in ED Medications  acetaminophen (TYLENOL) tablet 650 mg (650 mg Oral Given 06/16/23 1610)    ED Course/ Medical Decision Making/ A&P                                 Medical Decision Making Amount and/or Complexity of Data Reviewed Labs: ordered. Radiology: ordered.  Risk OTC drugs. Prescription drug management.   Patient is a 73 year old who presents with a cough.  It has been going on about 3 weeks.  He started with more URI symptoms which have mostly resolved but now he has mostly coughing.  He has some soreness related to the coughing but no other symptoms that sound more concerning for ACS.  He reports some intermittent shortness of breath but currently does not feel short of breath.  He has no hypoxia.  No increased work of  breathing.  His lungs are clear on exam without wheezing or suggestions of pneumonia.  He did have a chest x-ray which was interpreted by me and confirmed by the radiologist to show no evidence of pneumonia.  1 view was performed given the breakdown of the machine and radiology.  His respiratory panel was positive for influenza.  He likely has a lingering cough related to that.  He does not have other symptoms that sound more concerning for CHF or PE.  He is otherwise well-appearing.  He was discharged home in good condition.  He was given a prescription for Tessalon pearls for his cough.  He was encouraged to follow-up with his PCP if his symptoms are improving.  Return precautions were given.  Final Clinical Impression(s) / ED Diagnoses Final diagnoses:  Influenza    Rx / DC Orders ED Discharge Orders          Ordered    benzonatate (TESSALON) 100 MG capsule  Every 8 hours        06/16/23 0909              Rolan Bucco, MD 06/16/23 [phone]

## 2023-06-16 NOTE — ED Triage Notes (Signed)
 Pt has been having coughing,headache, nausea off and on for 3 weeks. Denies vomiting. Diarrhea started around 1 week ago but better today. Pt did take mucinex this morning.

## 2023-06-17 ENCOUNTER — Ambulatory Visit: Payer: Self-pay | Admitting: Family Medicine

## 2023-06-17 NOTE — Telephone Encounter (Signed)
   Chief Complaint: flu diagnosis- cough Symptoms: cough, chills, body aches, SOB with cough Frequency: not sure when started- but patient did go to ED 06/16/23 where he was diagnosed Disposition: [] ED /[] Urgent Care (no appt availability in office) / [x] Appointment(In office/virtual)/ []  [facility]/ [] Home Care/ [] Refused Recommended Disposition /[] [facility]/ []  Follow-up with PCP Additional Notes: Patient states he was advised to see PCP when left ED- patient has been diagnosed with flu- he states he is having trouble with cough, fatigue other symptoms, he is concerned about his BP as well. Appointment scheduled.     Copied from CRM [id number]. Topic: Clinical - Red Word Triage >> Jun 17, 2023  9:03 AM Deaijah H wrote: Red Word that prompted transfer to Nurse Triage: Positive Flu test - hospital follow up / gotten worse Reason for Disposition  Earache  Answer Assessment - Initial Assessment Questions 1. WORST SYMPTOM: "What is your worst symptom?" (e.g., cough, runny nose, muscle aches, headache, sore throat, fever)      cough 2. ONSET: "When did your flu symptoms start?"      Started last week- started as cold symptoms 3. COUGH: "How bad is the cough?"       Productive- yellow 4. RESPIRATORY DISTRESS: "Describe your breathing."      SOB- with cough 5. FEVER: "Do you have a fever?" If Yes, ask: "What is your temperature, how was it measured, and when did it start?"     no 6. EXPOSURE: "Were you exposed to someone with influenza?"       unknown 7. FLU VACCINE: "Did you get a flu shot this year?"     yes 8. HIGH RISK DISEASE: "Do you have any chronic medical problems?" (e.g., heart or lung disease, asthma, weak immune system, or other HIGH RISK conditions)     High BP  10. OTHER SYMPTOMS: "Do you have any other symptoms?"  (e.g., runny nose, muscle aches, headache, sore throat)       Headache, ears pain-pressure  Protocols used: Influenza (Flu) -  [facility]

## 2023-06-17 NOTE — Telephone Encounter (Signed)
 noted

## 2023-06-18 ENCOUNTER — Inpatient Hospital Stay: Payer: Medicare Other | Admitting: Family Medicine

## 2023-06-18 ENCOUNTER — Ambulatory Visit (INDEPENDENT_AMBULATORY_CARE_PROVIDER_SITE_OTHER): Payer: Medicare Other | Admitting: Family Medicine

## 2023-06-18 ENCOUNTER — Encounter: Payer: Self-pay | Admitting: Family Medicine

## 2023-06-18 VITALS — BP 134/78 | HR 78 | Temp 98.3°F | Ht 67.0 in | Wt 229.0 lb

## 2023-06-18 DIAGNOSIS — Z7985 Long-term (current) use of injectable non-insulin antidiabetic drugs: Secondary | ICD-10-CM | POA: Diagnosis not present

## 2023-06-18 DIAGNOSIS — E1169 Type 2 diabetes mellitus with other specified complication: Secondary | ICD-10-CM

## 2023-06-18 DIAGNOSIS — Z7984 Long term (current) use of oral hypoglycemic drugs: Secondary | ICD-10-CM

## 2023-06-18 DIAGNOSIS — I2510 Atherosclerotic heart disease of native coronary artery without angina pectoris: Secondary | ICD-10-CM

## 2023-06-18 DIAGNOSIS — I10 Essential (primary) hypertension: Secondary | ICD-10-CM | POA: Diagnosis not present

## 2023-06-18 DIAGNOSIS — J101 Influenza due to other identified influenza virus with other respiratory manifestations: Secondary | ICD-10-CM | POA: Diagnosis not present

## 2023-06-18 MED ORDER — OSELTAMIVIR PHOSPHATE 75 MG PO CAPS: 75.0000 mg | ORAL_CAPSULE | Freq: Two times a day (BID) | ORAL | 0 refills | Status: DC

## 2023-06-18 NOTE — Assessment & Plan Note (Signed)
 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

## 2023-06-18 NOTE — Assessment & Plan Note (Signed)
 Positive respiratory panel at ER 2d ago.  Symptoms ongoing for 1 week No signs of bacterial superinfection at this time.  Given comorbidities and not improving, will Rx Tamiflu antiviral.  Continue tessalon perls, OTC cough suppressant.  Update if not improving with treatment or if worsening symptoms despite treatment.

## 2023-06-18 NOTE — Progress Notes (Signed)
 Ph: [phone] Fax: [phone]   Patient ID: [name], male    DOB: [birth year], 73 y.o.   MRN: [id number]  This visit was conducted in person.  BP 134/78   Pulse 78   Temp 98.3 F (36.8 C) (Oral)   Ht 5\' 7"  (1.702 m)   Wt 229 lb (103.9 kg)   SpO2 97%   BMI 35.87 kg/m    CC: ER f/u visit  Subjective:   HPI: [name] is a 73 y.o. male presenting on 06/18/2023 for Hospitalization Follow-up (Seen on 06/16/23 at [facility] ED, dx influenza. )   Recent [facility] ER visit 06/16/2023 for 3 wks of cough, records reviewed.  1v CXR negative for pneumonia.  Respiratory panel positive for influenza A Treated with tessalon perls.   3 wks of sinus congestion, then 1 wk ago developed headaches, body aches, worsening cough. Some fevers but not recently. Yesterday coughed up large amt of yellow mucous. Chest > head congestion. Mild wheezing and dyspnea, although better than when he first started.  No recent fevers/chills, ear or tooth pain, ST, PNdrainage.   No sick contacts at home.  Tessalon perls were not covered by insurance - he started using daughter's (same Rx).  He's been taking mucinex DM q12hrs.      Relevant past medical, surgical, family and social history reviewed and updated as indicated. Interim medical history since our last visit reviewed. Allergies and medications reviewed and updated. Outpatient Medications Prior to Visit  Medication Sig Dispense Refill   ammonium lactate (AMLACTIN) 12 % cream Apply topically in the morning and at bedtime.     aspirin EC 81 MG tablet Take 81 mg by mouth daily.     atorvastatin (LIPITOR) 80 MG tablet TAKE 1 TABLET BY MOUTH DAILY 90 tablet 3   baclofen (LIORESAL) 10 MG tablet TAKE 1 TABLET BY MOUTH AT  BEDTIME AS NEEDED FOR MUSCLE  SPASM 100 tablet 1   benzonatate (TESSALON) 100 MG capsule Take 1 capsule (100 mg total) by mouth every 8 (eight) hours. 21 capsule 0   carvedilol (COREG) 6.25 MG tablet TAKE 1 TABLET BY MOUTH TWICE   DAILY WITH A MEAL 180 tablet 2   clobetasol cream (TEMOVATE) 0.05 %      doxycycline (VIBRAMYCIN) 100 MG capsule Take 1 capsule (100 mg total) by mouth 2 (two) times daily. 20 capsule 0   empagliflozin (JARDIANCE) 10 MG TABS tablet Take 1 tablet (10 mg total) by mouth daily. 90 tablet 3   fluticasone (FLONASE) 50 MCG/ACT nasal spray USE 2 SPRAYS IN BOTH NOSTRILS  DAILY 48 g 2   gabapentin (NEURONTIN) 100 MG capsule TAKE 1 CAPSULE BY MOUTH  TWICE DAILY 180 capsule 3   glucose blood (ONETOUCH ULTRA) test strip Use as instructed to check blood sugar 2 times a day 200 strip 3   guaiFENesin (MUCINEX) 600 MG 12 hr tablet Take 600 mg by mouth 2 (two) times daily as needed for cough.     losartan (COZAAR) 100 MG tablet TAKE 1 TABLET BY MOUTH DAILY 100 tablet 1   metFORMIN (GLUCOPHAGE) 1000 MG tablet TAKE 1 TABLET BY MOUTH TWICE  DAILY WITH MEALS 200 tablet 2   omeprazole (PRILOSEC) 40 MG capsule TAKE 1 CAPSULE BY MOUTH DAILY 90 capsule 3   oxyCODONE-acetaminophen (PERCOCET) 10-325 MG tablet Take 1 tablet by mouth every 4 (four) hours as needed for pain. (Patient taking differently: Take 1 tablet by mouth every 4 (four) hours.) 10 tablet  0   polyvinyl alcohol (LIQUIFILM TEARS) 1.4 % ophthalmic solution Place 1 drop into both eyes as needed for dry eyes.     Semaglutide,0.25 or 0.5MG /DOS, (OZEMPIC, 0.25 OR 0.5 MG/DOSE,) 2 MG/1.5ML SOPN Inject 0.5 mg into the skin once a week. 1.5 mL 3   Semaglutide,0.25 or 0.5MG /DOS, (OZEMPIC, 0.25 OR 0.5 MG/DOSE,) 2 MG/3ML SOPN Inject 0.25 mg into the skin once a week. 3 mL 0   sildenafil (VIAGRA) 100 MG tablet Take 0.5-1 tablets (50-100 mg total) by mouth as needed for erectile dysfunction. 10 tablet 1   tamsulosin (FLOMAX) 0.4 MG CAPS capsule Take 0.4 mg by mouth daily.     Facility-Administered Medications Prior to Visit  Medication Dose Route Frequency Provider Last Rate Last Admin   triamcinolone (KENALOG) 0.1 % cream   Topical BID Lenn Sink, DPM         Per  HPI unless specifically indicated in ROS section below Review of Systems  Objective:  BP 134/78   Pulse 78   Temp 98.3 F (36.8 C) (Oral)   Ht 5\' 7"  (1.702 m)   Wt 229 lb (103.9 kg)   SpO2 97%   BMI 35.87 kg/m   Wt Readings from Last 3 Encounters:  06/18/23 229 lb (103.9 kg)  06/16/23 230 lb (104.3 kg)  04/05/23 230 lb (104.3 kg)      Physical Exam Vitals and nursing note reviewed.  Constitutional:      Appearance: Normal appearance. He is not ill-appearing.  HENT:     Head: Normocephalic and atraumatic.     Right Ear: Hearing, tympanic membrane, ear canal and external ear normal. There is no impacted cerumen.     Left Ear: Hearing, tympanic membrane, ear canal and external ear normal. There is no impacted cerumen.     Nose: Nose normal.     Right Sinus: No maxillary sinus tenderness or frontal sinus tenderness.     Left Sinus: No maxillary sinus tenderness or frontal sinus tenderness.     Comments: Wearing mask    Mouth/Throat:     Mouth: Mucous membranes are moist.     Pharynx: Oropharynx is clear. No oropharyngeal exudate or posterior oropharyngeal erythema.  Eyes:     Extraocular Movements: Extraocular movements intact.     Conjunctiva/sclera: Conjunctivae normal.     Pupils: Pupils are equal, round, and reactive to light.  Cardiovascular:     Rate and Rhythm: Normal rate and regular rhythm.     Pulses: Normal pulses.     Heart sounds: Normal heart sounds. No murmur heard. Pulmonary:     Effort: Pulmonary effort is normal. No respiratory distress.     Breath sounds: Normal breath sounds. No wheezing, rhonchi or rales.     Comments:  Lungs clear Intermittent productive cough present Musculoskeletal:     Cervical back: Normal range of motion and neck supple. No rigidity.     Right lower leg: No edema.     Left lower leg: No edema.  Lymphadenopathy:     Cervical: No cervical adenopathy.  Skin:    General: Skin is warm and dry.     Findings: No rash.   Neurological:     Mental Status: He is alert.  Psychiatric:        Mood and Affect: Mood normal.        Behavior: Behavior normal.       Results for orders placed or performed during the hospital encounter of 06/16/23  Resp panel  by RT-PCR (RSV, Flu A&B, Covid) Anterior Nasal Swab   Collection Time: 06/16/23  7:18 AM   Specimen: Anterior Nasal Swab  Result Value Ref Range   SARS Coronavirus 2 by RT PCR NEGATIVE NEGATIVE   Influenza A by PCR POSITIVE (A) NEGATIVE   Influenza B by PCR NEGATIVE NEGATIVE   Resp Syncytial Virus by PCR NEGATIVE NEGATIVE  Comprehensive metabolic panel   Collection Time: 06/16/23  7:22 AM  Result Value Ref Range   Sodium 133 (L) 135 - 145 mmol/L   Potassium 3.7 3.5 - 5.1 mmol/L   Chloride 99 98 - 111 mmol/L   CO2 22 22 - 32 mmol/L   Glucose, Bld 158 (H) 70 - 99 mg/dL   BUN 13 8 - 23 mg/dL   Creatinine, Ser 2.72 0.61 - 1.24 mg/dL   Calcium 8.7 (L) 8.9 - 10.3 mg/dL   Total Protein 7.5 6.5 - 8.1 g/dL   Albumin 3.9 3.5 - 5.0 g/dL   AST 17 15 - 41 U/L   ALT 22 0 - 44 U/L   Alkaline Phosphatase 40 38 - 126 U/L   Total Bilirubin 0.7 0.0 - 1.2 mg/dL   GFR, Estimated >53 >66 mL/min   Anion gap 12 5 - 15  CBC with Differential   Collection Time: 06/16/23  7:22 AM  Result Value Ref Range   WBC 6.3 4.0 - 10.5 K/uL   RBC 4.51 4.22 - 5.81 MIL/uL   Hemoglobin 13.7 13.0 - 17.0 g/dL   HCT 44.0 34.7 - 42.5 %   MCV 91.6 80.0 - 100.0 fL   MCH 30.4 26.0 - 34.0 pg   MCHC 33.2 30.0 - 36.0 g/dL   RDW 95.6 38.7 - 56.4 %   Platelets 205 150 - 400 K/uL   nRBC 0.0 0.0 - 0.2 %   Neutrophils Relative % 72 %   Neutro Abs 4.6 1.7 - 7.7 K/uL   Lymphocytes Relative 13 %   Lymphs Abs 0.8 0.7 - 4.0 K/uL   Monocytes Relative 14 %   Monocytes Absolute 0.9 0.1 - 1.0 K/uL   Eosinophils Relative 1 %   Eosinophils Absolute 0.0 0.0 - 0.5 K/uL   Basophils Relative 0 %   Basophils Absolute 0.0 0.0 - 0.1 K/uL   Immature Granulocytes 0 %   Abs Immature Granulocytes 0.02 0.00  - 0.07 K/uL    Assessment & Plan:   Problem List Items Addressed This Visit     Type 2 diabetes mellitus with other specified complication (HCC)   Essential hypertension   Severe obesity (BMI 35.0-39.9) with comorbidity (HCC)   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  Symptoms ongoing for 1 week No signs of bacterial superinfection at this time.  Given comorbidities and not improving, will Rx Tamiflu antiviral.  Continue tessalon perls, OTC cough suppressant.  Update if not improving with treatment or if worsening symptoms despite treatment.       Relevant Medications   oseltamivir (TAMIFLU) 75 MG capsule     Meds ordered this encounter  Medications   oseltamivir (TAMIFLU) 75 MG capsule    Sig: Take 1 capsule (75 mg total) by mouth 2 (two) times daily.    Dispense:  10 capsule    Refill:  0    No orders of the defined types were placed in this encounter.   Patient Instructions  Given symptoms not improving, may take tamiflu antiviral sent to pharmacy twice per day for 5 days.  Push small sips of fluids and plenty of rest Take tylenol as needed for body aches and fever.  Tessalon perls or over the counter cough medicine like robitussin or delsym to suppress cough.  Let us know if fever >101, or worsening productive cough develops  Follow up plan: Return if symptoms worsen or fail to improve.  Eustaquio Boyden, MD

## 2023-06-18 NOTE — Patient Instructions (Signed)
 Given symptoms not improving, may take tamiflu antiviral sent to pharmacy twice per day for 5 days.  Push small sips of fluids and plenty of rest Take tylenol as needed for body aches and fever.  Tessalon perls or over the counter cough medicine like robitussin or delsym to suppress cough.  Let us know if fever >101, or worsening productive cough develops

## 2023-07-01 ENCOUNTER — Telehealth: Payer: Self-pay

## 2023-07-01 NOTE — Telephone Encounter (Signed)
 Patient was identified as falling into the [place] Measure - Diabetes.   Patient was: Appointment scheduled for lab or office visit for A1c.

## 2023-07-05 ENCOUNTER — Ambulatory Visit: Payer: Medicare Other | Admitting: Family Medicine

## 2023-07-05 NOTE — Telephone Encounter (Signed)
 Please call patient no showed appointment with Dr. Reece Agar today. Will need to be rescheduled.    Patient was identified as falling into the [place] Measure - Diabetes.   Patient was: Appointment scheduled with primary care provider in the next 30 days.

## 2023-07-05 NOTE — Telephone Encounter (Signed)
 Made patient an appointment for March 26th at 10am  Patient was getting over being sick and states he forgot about appointment

## 2023-07-16 ENCOUNTER — Encounter: Payer: Self-pay | Admitting: Pharmacist

## 2023-07-16 NOTE — Progress Notes (Signed)
 Thank you will discuss with pt tomorrow

## 2023-07-16 NOTE — Progress Notes (Signed)
 Chart Review:  Date of review: 07/16/23 Patient was identified as falling into the True [facility] for Diabetes control.    Lab Results  Component Value Date   HGBA1C 8.4 (H) 03/29/2023    Last A1c: 8.4% (03/28/24) Next A1c Due: 06/27/23 (No show previous PCP apt) Next Scheduled Visit: 07/17/23  Adherence consideration: - Metformin last filled 04/02/23 (refill overdue)   Pharmacy Considerations:  Jardiance Room to titrate to 25 mg daily, especially if decision not to titrate Ozempic. eGFR stable. No renal dysfunction. Requires BI Cares form.  Ozempic If cost is a concern, likely eligible for PAP to receive Ozempic for free via [employer]   Follow Up:  Pharm chart review: 1 months  Future Appointments  Date Time Provider Department Center  07/17/2023 10:00 AM Eustaquio Boyden, MD [facility]  07/31/2023  9:00 AM Wendall Stade, MD [facility]  12/10/2023  1:40 PM [facility] ANNUAL WELLNESS VISIT 1 [facility]

## 2023-07-17 ENCOUNTER — Encounter: Payer: Self-pay | Admitting: Family Medicine

## 2023-07-17 ENCOUNTER — Ambulatory Visit (INDEPENDENT_AMBULATORY_CARE_PROVIDER_SITE_OTHER): Admitting: Family Medicine

## 2023-07-17 VITALS — BP 132/78 | HR 77 | Temp 98.3°F | Ht 67.0 in | Wt 228.2 lb

## 2023-07-17 DIAGNOSIS — M5412 Radiculopathy, cervical region: Secondary | ICD-10-CM | POA: Insufficient documentation

## 2023-07-17 DIAGNOSIS — Z7984 Long term (current) use of oral hypoglycemic drugs: Secondary | ICD-10-CM | POA: Diagnosis not present

## 2023-07-17 DIAGNOSIS — R809 Proteinuria, unspecified: Secondary | ICD-10-CM | POA: Diagnosis not present

## 2023-07-17 DIAGNOSIS — I10 Essential (primary) hypertension: Secondary | ICD-10-CM | POA: Diagnosis not present

## 2023-07-17 DIAGNOSIS — E1129 Type 2 diabetes mellitus with other diabetic kidney complication: Secondary | ICD-10-CM

## 2023-07-17 DIAGNOSIS — E1169 Type 2 diabetes mellitus with other specified complication: Secondary | ICD-10-CM | POA: Diagnosis not present

## 2023-07-17 LAB — POCT GLYCOSYLATED HEMOGLOBIN (HGB A1C): Hemoglobin A1C: 7.6 % — AB (ref 4.0–5.6)

## 2023-07-17 MED ORDER — EMPAGLIFLOZIN 25 MG PO TABS
25.0000 mg | ORAL_TABLET | Freq: Every day | ORAL | 2 refills | Status: DC
Start: 2023-07-17 — End: 2023-07-19

## 2023-07-17 NOTE — Assessment & Plan Note (Signed)
 Describes this, followed by PM&R and ortho.

## 2023-07-17 NOTE — Assessment & Plan Note (Addendum)
 Discussed with patient.  Already on ARB, SGLT2i.  Latest microalb/cr ratio 135.  Will continue to monitor this. Encouraged optimizing BP and sugar control to help delay progression.

## 2023-07-17 NOTE — Patient Instructions (Addendum)
 Increase Jardiance to 25mg  daily - new dose sent to [facility].  We will check into eligibility for assistance program through manufacturer for Ozempic.  Return in 3 months for follow up visit.

## 2023-07-17 NOTE — Progress Notes (Signed)
 Spoke with patient at [facility] today - he is interested in trial of Ozempic but it was unaffordable through his insurance. Can we see if pt is eligible for PAP through [employer]?  Thank you!

## 2023-07-17 NOTE — Assessment & Plan Note (Addendum)
 Chronic, overall stable on current regimen.  Continue this, monitoring effect of higher jardiance dose.

## 2023-07-17 NOTE — Assessment & Plan Note (Signed)
 Improving control however remains above goal.  Already on full dose metformin.  Will increase jardiance to 25mg  daily, discussed watching for UTI symptoms, yeast infection or groin cellulitis. He is interested in trial ozempic - but it was unaffordable through his insurance. Will ask pharmacy to check on eligibility for patient for PAP through manufacturer.

## 2023-07-17 NOTE — Progress Notes (Signed)
 Ph: [phone] Fax: [phone]   Patient ID: [name], male    DOB: 1950/06/23, 73 y.o.   MRN: [id number]  This visit was conducted in person.  BP 132/78 (BP Location: Right Arm, Cuff Size: Large)   Pulse 77   Temp 98.3 F (36.8 C) (Oral)   Ht 5\' 7"  (1.702 m)   Wt 228 lb 4 oz (103.5 kg)   SpO2 98%   BMI 35.75 kg/m    CC: 3 mo DM f/u visit  Subjective:   HPI: [name] is a 73 y.o. male presenting on 07/17/2023 for Medical Management of Chronic Issues (Here for 3 mo DM f/u.)   Discussed recently discovered [facility] laboratory miscalculation - the UACR calculation in the software of the system was incorrect but the absolute levels of microalbumin and creatinine were correct.   Discussing left neck surgery for L cervical radiculopathy (Dr Ethelene Hal, Dr Shon Baton) s/p cervical xrays and MRI.   DM - does regularly check fasting sugars 137 this morning. Compliant with antihyperglycemic regimen which includes: jardiance 10mg  daily, metformin 1000mg  bid. Denies low sugars or hypoglycemic symptoms. Denies blurry vision. + L hand paresthesias Last diabetic eye exam 07/2022 - upcoming due. Glucometer brand: one touch ultra. Last foot exam: 12/2022. DSME: declined but will consider. Lab Results  Component Value Date   HGBA1C 7.6 (A) 07/17/2023   Diabetic Foot Exam - Simple   No data filed    Lab Results  Component Value Date   MICROALBUR 11.3 (H) 03/29/2023  He notes he's not taking Ozempic due to cost ($200+/month). Per pharmacy team, he may be eligible for PAP to receive Ozempic for free via [employer]. He desires to try this.   HTN - Compliant with current antihypertensive regimen of carvedilol 6.25mg  bid, losartan 100mg  daily. Does check blood pressures at home: 135/78.  No low blood pressure readings or symptoms of dizziness/syncope.  Denies HA, vision changes, CP/tightness, SOB, leg swelling.       Relevant past medical, surgical, family and social  history reviewed and updated as indicated. Interim medical history since our last visit reviewed. Allergies and medications reviewed and updated. Outpatient Medications Prior to Visit  Medication Sig Dispense Refill   ammonium lactate (AMLACTIN) 12 % cream Apply topically in the morning and at bedtime.     aspirin EC 81 MG tablet Take 81 mg by mouth daily.     atorvastatin (LIPITOR) 80 MG tablet TAKE 1 TABLET BY MOUTH DAILY 90 tablet 3   baclofen (LIORESAL) 10 MG tablet TAKE 1 TABLET BY MOUTH AT  BEDTIME AS NEEDED FOR MUSCLE  SPASM 100 tablet 1   benzonatate (TESSALON) 100 MG capsule Take 1 capsule (100 mg total) by mouth every 8 (eight) hours. 21 capsule 0   carvedilol (COREG) 6.25 MG tablet TAKE 1 TABLET BY MOUTH TWICE  DAILY WITH A MEAL 180 tablet 2   clobetasol cream (TEMOVATE) 0.05 %      doxycycline (VIBRAMYCIN) 100 MG capsule Take 1 capsule (100 mg total) by mouth 2 (two) times daily. 20 capsule 0   fluticasone (FLONASE) 50 MCG/ACT nasal spray USE 2 SPRAYS IN BOTH NOSTRILS  DAILY 48 g 2   gabapentin (NEURONTIN) 100 MG capsule TAKE 1 CAPSULE BY MOUTH  TWICE DAILY 180 capsule 3   glucose blood (ONETOUCH ULTRA) test strip Use as instructed to check blood sugar 2 times a day 200 strip 3   guaiFENesin (MUCINEX) 600 MG 12 hr tablet Take  600 mg by mouth 2 (two) times daily as needed for cough.     losartan (COZAAR) 100 MG tablet TAKE 1 TABLET BY MOUTH DAILY 100 tablet 1   metFORMIN (GLUCOPHAGE) 1000 MG tablet TAKE 1 TABLET BY MOUTH TWICE  DAILY WITH MEALS 200 tablet 2   omeprazole (PRILOSEC) 40 MG capsule TAKE 1 CAPSULE BY MOUTH DAILY 90 capsule 3   oseltamivir (TAMIFLU) 75 MG capsule Take 1 capsule (75 mg total) by mouth 2 (two) times daily. 10 capsule 0   polyvinyl alcohol (LIQUIFILM TEARS) 1.4 % ophthalmic solution Place 1 drop into both eyes as needed for dry eyes.     Semaglutide,0.25 or 0.5MG /DOS, (OZEMPIC, 0.25 OR 0.5 MG/DOSE,) 2 MG/1.5ML SOPN Inject 0.5 mg into the skin once a week. 1.5  mL 3   Semaglutide,0.25 or 0.5MG /DOS, (OZEMPIC, 0.25 OR 0.5 MG/DOSE,) 2 MG/3ML SOPN Inject 0.25 mg into the skin once a week. 3 mL 0   sildenafil (VIAGRA) 100 MG tablet Take 0.5-1 tablets (50-100 mg total) by mouth as needed for erectile dysfunction. 10 tablet 1   tamsulosin (FLOMAX) 0.4 MG CAPS capsule Take 0.4 mg by mouth daily.     empagliflozin (JARDIANCE) 10 MG TABS tablet Take 1 tablet (10 mg total) by mouth daily. 90 tablet 3   oxyCODONE-acetaminophen (PERCOCET) 10-325 MG tablet Take 1 tablet by mouth every 4 (four) hours as needed for pain. (Patient taking differently: Take 1 tablet by mouth every 4 (four) hours.) 10 tablet 0   oxyCODONE-acetaminophen (PERCOCET) 10-325 MG tablet Take 1 tablet by mouth every 4 (four) hours as needed for pain.     Facility-Administered Medications Prior to Visit  Medication Dose Route Frequency Provider Last Rate Last Admin   triamcinolone (KENALOG) 0.1 % cream   Topical BID Lenn Sink, DPM         Per HPI unless specifically indicated in ROS section below Review of Systems  Objective:  BP 132/78 (BP Location: Right Arm, Cuff Size: Large)   Pulse 77   Temp 98.3 F (36.8 C) (Oral)   Ht 5\' 7"  (1.702 m)   Wt 228 lb 4 oz (103.5 kg)   SpO2 98%   BMI 35.75 kg/m   Wt Readings from Last 3 Encounters:  07/17/23 228 lb 4 oz (103.5 kg)  06/18/23 229 lb (103.9 kg)  06/16/23 230 lb (104.3 kg)      Physical Exam Vitals and nursing note reviewed.  Constitutional:      Appearance: Normal appearance. He is not ill-appearing.  Eyes:     Extraocular Movements: Extraocular movements intact.     Conjunctiva/sclera: Conjunctivae normal.     Pupils: Pupils are equal, round, and reactive to light.  Cardiovascular:     Rate and Rhythm: Normal rate and regular rhythm.     Pulses: Normal pulses.     Heart sounds: Normal heart sounds. No murmur heard. Pulmonary:     Effort: Pulmonary effort is normal. No respiratory distress.     Breath sounds: Normal  breath sounds. No wheezing, rhonchi or rales.  Musculoskeletal:     Right lower leg: No edema.     Left lower leg: No edema.     Comments: See HPI for foot exam if done  Skin:    General: Skin is warm and dry.     Findings: No rash.  Neurological:     Mental Status: He is alert.  Psychiatric:        Mood and Affect: Mood normal.  Behavior: Behavior normal.       Results for orders placed or performed in visit on 07/17/23  POCT glycosylated hemoglobin (Hb A1C)   Collection Time: 07/17/23  9:50 AM  Result Value Ref Range   Hemoglobin A1C 7.6 (A) 4.0 - 5.6 %   HbA1c POC (<> result, manual entry)     HbA1c, POC (prediabetic range)     HbA1c, POC (controlled diabetic range)      Assessment & Plan:   Problem List Items Addressed This Visit     Type 2 diabetes mellitus with other specified complication (HCC) - Primary   Improving control however remains above goal.  Already on full dose metformin.  Will increase jardiance to 25mg  daily, discussed watching for UTI symptoms, yeast infection or groin cellulitis. He is interested in trial ozempic - but it was unaffordable through his insurance. Will ask pharmacy to check on eligibility for patient for PAP through manufacturer.       Relevant Medications   empagliflozin (JARDIANCE) 25 MG TABS tablet   Other Relevant Orders   POCT glycosylated hemoglobin (Hb A1C) (Completed)   Essential hypertension   Chronic, overall stable on current regimen.  Continue this, monitoring effect of higher jardiance dose.       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  Already on ARB, SGLT2i.  Latest microalb/cr ratio 135.  Will continue to monitor this. Encouraged optimizing BP and sugar control to help delay progression.      Relevant Medications   empagliflozin (JARDIANCE) 25 MG TABS tablet   Left cervical radiculopathy   Describes this, followed by PM&R and ortho.         Meds  ordered this encounter  Medications   empagliflozin (JARDIANCE) 25 MG TABS tablet    Sig: Take 1 tablet (25 mg total) by mouth daily.    Dispense:  90 tablet    Refill:  2    Note new dose    Orders Placed This Encounter  Procedures   POCT glycosylated hemoglobin (Hb A1C)    Patient Instructions  Increase Jardiance to 25mg  daily - new dose sent to [facility].  We will check into eligibility for assistance program through manufacturer for Ozempic.  Return in 3 months for follow up visit.    Follow up plan: Return in about 3 months (around 10/17/2023) for follow up visit.  Eustaquio Boyden, MD

## 2023-07-19 ENCOUNTER — Other Ambulatory Visit (INDEPENDENT_AMBULATORY_CARE_PROVIDER_SITE_OTHER): Payer: Self-pay | Admitting: Pharmacist

## 2023-07-19 DIAGNOSIS — E1169 Type 2 diabetes mellitus with other specified complication: Secondary | ICD-10-CM

## 2023-07-19 MED ORDER — EMPAGLIFLOZIN 25 MG PO TABS: 25.0000 mg | ORAL_TABLET | Freq: Every day | ORAL | 2 refills | Status: DC

## 2023-07-19 NOTE — Progress Notes (Addendum)
 Patient Assistance Program (PAP) Application   Manufacturer: [employer] ([employer])    (Currently enrolled  -  dose change request) Medication(s): Jardiance increased to 25 mg daily at last PCP visit   Prescription(s): Jardiance 25 mg daily  Pended to PCP (Re-order to [facility]) _________________________________________   2. Patient Assistance Program (PAP) Application   Manufacturer: [employer]    (New enrollment) Medication(s): Ozempic  Patient Portion of Application:  07/19/23: Completed with patient via online enrollment tool. Submitted.  Income Documentation: N/A - Electronic verification elected.  Provider Portion of Application:  07/19/23: Provider portion completed by PharmD and uploaded PCP eFax folder for signature.  Prescription(s): Included in MAP application. Ozempic 0.25 x1 month Ozempic 0.5 mg x1 month Ozempic 1.0 mg x 2 boxes (can microdose to 0.5 mg if not tolerated)  APPROVED through 04/22/2024  Next Steps: [x]    Application filled out and uploaded to PCP eFax folder for review/signature [x]    Upon PCP signature Application to be faxed to [employer] Fax: [phone] with copy of patient's insurance card AND scanned into patient chart - faxed by [facility] 07/23/23  Forwarded to [facility] CPhT Patient Advocate Team for future correspondences/re-enrollment.  Note routed to PCP Clinic Pool to ensure PCP signature is obtained and application is faxed.  *[facility] clinic team - Please Addend/update this note as the "Next Steps" are completed in office*

## 2023-07-19 NOTE — Progress Notes (Signed)
 New Rx sent to requested pharmacy Thanks.

## 2023-07-22 DIAGNOSIS — M5030 Other cervical disc degeneration, unspecified cervical region: Secondary | ICD-10-CM | POA: Diagnosis not present

## 2023-07-22 DIAGNOSIS — Z79891 Long term (current) use of opiate analgesic: Secondary | ICD-10-CM | POA: Diagnosis not present

## 2023-07-22 DIAGNOSIS — M5412 Radiculopathy, cervical region: Secondary | ICD-10-CM | POA: Diagnosis not present

## 2023-07-22 DIAGNOSIS — M542 Cervicalgia: Secondary | ICD-10-CM | POA: Diagnosis not present

## 2023-07-22 DIAGNOSIS — M961 Postlaminectomy syndrome, not elsewhere classified: Secondary | ICD-10-CM | POA: Diagnosis not present

## 2023-07-22 NOTE — Progress Notes (Signed)
 Next Steps: [x]    Application filled out and uploaded to PCP eFax folder for review/signature []    Upon PCP signature Application to be faxed to [employer] Fax: [phone] with copy of patient's insurance card AND scanned into patient chart

## 2023-07-23 DIAGNOSIS — Z5181 Encounter for therapeutic drug level monitoring: Secondary | ICD-10-CM | POA: Diagnosis not present

## 2023-07-23 DIAGNOSIS — Z79899 Other long term (current) drug therapy: Secondary | ICD-10-CM | POA: Diagnosis not present

## 2023-07-23 NOTE — Progress Notes (Signed)
 Next Steps: [x]    Application filled out and uploaded to PCP eFax folder for review/signature [x]    Upon PCP signature Application to be faxed to [employer] Fax: [phone] with copy of patient's insurance card AND scanned into patient chart

## 2023-07-24 NOTE — Progress Notes (Deleted)
 CARDIOLOGY CONSULT NOTE       Patient ID: [name] MRN: [id number] DOB/AGE: 1950-06-24 73 y.o.  Admit date: (Not on file) Referring Physician: Sharen Hones Primary Physician: Eustaquio Boyden, MD Primary Cardiologist: Mayford Knife before Reason for Consultation: Aortic Aneurysm    HPI:  73 y.o. re established care 07/26/20  History of HTN, DM-2, HLD, GERD, Prostate cancer and remote history of substance abuse. Had normal cath in 2013 And again in May 2020. Dr Eldridge Dace note indicates severe right subclavian tortuosity and not to use right radial for cath again EF normal 55-60% TTE done 04/12/20 showed aortic root 4.3 cm with EF 55-60% tricuspid AV with mild AR  CT done 07/26/20 showed no aortic aneurysm   He is long time divorced. Retired from cooking Has a daughter in town Works out and bakes   No cardiac complaints  Likes his Estella Husk and watches Regular church goer   No cardiac symptoms BP elevated Discussed increasing coreg and cozaar with f/u primary He has a cuff at home and will start to monitor 3x/week His weight is up and baking/eating too many sweets  07/26/22 cozaar and coreg doses increased for HTN  ***  ROS All other systems reviewed and negative except as noted above  Past Medical History:  Diagnosis Date  . Acquired dilation of ascending aorta and aortic root (HCC)    43mm ascending aorta  . Allergic rhinitis   . Anxiety   . Atherosclerosis of aorta (HCC)   . CAD (coronary artery disease) cardiologist-- dr Gloris [place] turner   long hx atypical chest pain--- cardiac cath 08-24-2002 in epic showed normal coronaries;   03-17-2007  nuclear stress test showed ?small infarct , ef 60%;  cath done 12-31-2007 for recurrent CP showed nonob cad;   08-27-2018 stress test showed intermediate risk w/ apex ischemia, nuclear ef 61%;   cath done 08-29-2018 showed Nonobstructive 25% plaque in LAD   . Chronic neck pain    narcotic dependant  . DDD (degenerative disc disease),  cervical   . Depression   . Diabetic neuropathy (HCC)   . Diverticulosis 2013   by CT scan  . Erectile dysfunction 08/01/2015  . Essential hypertension 01/01/2008  . Fatty liver 07/31/2018   By [place] 07/2018,  followed by pcp  . GERD (gastroesophageal reflux disease)   . Hemorrhoids   . History of anemia   . History of cocaine abuse (HCC)    per pt quit 05/ 2004  . History of cocaine use 2004  . History of colon polyps   . History of epididymitis   . History of esophageal stricture    s/p  dilatation 2009;  12-30-2018  . History of gastric ulcer    12-30-2018  EGD showed non-bleeding gastric ulcer  . History of prostatitis 08/2011  . History of transient ischemic attack (TIA)    remote hx , related to cocaine abuse which pt quit 2005  . Hyperlipidemia   . Hypertension    Essential  . Lipoma of neck 10/01/2014   S/p excision    . OSA (obstructive sleep apnea)    study 05-07-2008 in epic , mild ,  pt does not use cpap  . Pain due to onychomycosis of toenails of both feet 10/15/2018  . Prostate cancer [facility]) urologist--- dr herrick/  oncologist--- dr [name]   first dx 01/ 2018 w/ Stage T2a and Gleason 3+3 , active survillance;  repear bx 04-21-2019 Gleason 3+4,  scheduled for brachytherapy 07-03-2019  .  T2DM (type 2 diabetes mellitus) (HCC)    followed by pcp---  (03-04-201  checks blood sugar 3 times daily,  fasting sugar's-- 140-150    Family History  Problem Relation Age of Onset  . Diabetes Mother   . Hypertension Mother   . Hyperlipidemia Mother   . Diabetes Father   . Diabetes Sister   . Hypertension Sister   . Hypertension Brother   . Diabetes Brother   . Diabetes Brother   . Hypertension Brother   . Diabetes Brother   . Heart disease Maternal Uncle        Heart failure  . Cancer Paternal Uncle        Prostate  . Cancer Paternal Grandfather        Prostate  . Stroke Neg Hx   . Esophageal cancer Neg Hx   . Rectal cancer Neg Hx   . Stomach cancer Neg Hx   .  Breast cancer Neg Hx   . Colon cancer Neg Hx     Social History   Socioeconomic History  . Marital status: Divorced    Spouse name: Not on file  . Number of children: 1  . Years of education: Not on file  . Highest education level: Not on file  Occupational History  . Occupation: [name] previously    Associate Professor: UNEMPLOYED    Comment: Now on disability after neck surgery  Tobacco Use  . Smoking status: Never  . Smokeless tobacco: Never  Vaping Use  . Vaping status: Never Used  Substance and Sexual Activity  . Alcohol use: No    Alcohol/week: 0.0 standard drinks of alcohol  . Drug use: Not Currently    Comment: H/O marijuana (last 2005); Cocaine 5/04  . Sexual activity: Not on file  Other Topics Concern  . 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   Activity: no regular exercise, wants to start walking   Diet: good water, fruits/vegetables daily.      Retired   1 daughter and 1 granddaughter   Social Drivers of Corporate investment banker Strain: Low Risk  (12/04/2022)   Overall Financial Resource Strain (CARDIA)   . Difficulty of Paying Living Expenses: Not hard at all  Food Insecurity: No Food Insecurity (12/04/2022)   Hunger Vital Sign   . Worried About Programme researcher, broadcasting/film/video in the Last Year: Never true   . Ran Out of Food in the Last Year: Never true  Transportation Needs: No Transportation Needs (12/04/2022)   PRAPARE - Transportation   . Lack of Transportation (Medical): No   . Lack of Transportation (Non-Medical): No  Physical Activity: Inactive (12/04/2022)   Exercise Vital Sign   . Days of Exercise per Week: 0 days   . Minutes of Exercise per Session: 0 min  Stress: No Stress Concern Present (12/04/2022)   [employer] of Occupational Health - Occupational Stress  Questionnaire   . Feeling of Stress : Not at all  Social Connections: Moderately Isolated (12/04/2022)   Social Connection and Isolation Panel [NHANES]   . Frequency of Communication with Friends and Family: More than three times a week   . Frequency of Social Gatherings with Friends and Family: More than three times a week   . Attends Religious Services: 1 to 4 times per year   .  Active Member of Clubs or Organizations: No   . Attends Banker Meetings: Never   . Marital Status: Divorced  Catering manager Violence: Not At Risk (12/04/2022)   Humiliation, Afraid, Rape, and Kick questionnaire   . Fear of Current or Ex-Partner: No   . Emotionally Abused: No   . Physically Abused: No   . Sexually Abused: No    Past Surgical History:  Procedure Laterality Date  . ANTERIOR CERVICAL DECOMP/DISCECTOMY FUSION  06-24-2008  and 10-28-2008 @[facility]    06-25-2008  C7 -- T1;    10-28-2008  C5 -- C7  . CARDIAC CATHETERIZATION  08-24-2002   dr gamble  @[facility]    normal coronaries  . CARDIAC CATHETERIZATION  12-31-2007    dr Eden Emms   nonobstructive CAD  . CARPAL TUNNEL RELEASE Right 2000  approx.  Marland Kitchen CATARACT EXTRACTION W/ INTRAOCULAR LENS  IMPLANT, BILATERAL  2016  . COLONOSCOPY  10/11/2011   severe diverticulosis, int hemorrhoids, rec rpt 5 yrs [name])  . COLONOSCOPY  12/2018   benign rectal polyp [name])  . DIRECT LARYNGOSCOPY  11/02/2008   @[facility]    Extubation under anesthesia post op complication from ACDF  . ESOPHAGOGASTRODUODENOSCOPY  10/11/2011   WNL [name])  . ESOPHAGOGASTRODUODENOSCOPY  09/11-2018   gastric ulcer, gastritis, dilated esophageal stenosis, limit NSAIDs continue PPI, no f/u planned [name])  . EXCISION MASS NECK N/A 08/29/2016   Procedure: EXCISION MASS POSTERIOR NECK;  Surgeon: Harriette Bouillon, MD;  Location: [facility];  Service: General;  Laterality: N/A;  . HEMATOMA EVACUATION  10/29/2008   @[facility]    post op ACDF 10-28-2008  . LAPAROSCOPIC INGUINAL HERNIA  REPAIR Bilateral 11-22-2006   @[facility]    AND EXCISION RLQ ABDOMINAL LIPOMA  . LEFT HEART CATH AND CORONARY ANGIOGRAPHY N/A 08/29/2018   Procedure: LEFT HEART CATH AND CORONARY ANGIOGRAPHY;  Surgeon: Corky Crafts, MD;  Location[facility] INVASIVE CV LAB;  Service: Cardiovascular;  Laterality: N/A;  . RADIOACTIVE SEED IMPLANT N/A 07/03/2019   Procedure: RADIOACTIVE SEED IMPLANT/BRACHYTHERAPY IMPLANT;  Surgeon: Crist Fat, MD;  Location: [facility];  Service: Urology;  Laterality: N/A;  . SHOULDER ARTHROSCOPY WITH SUBACROMIAL DECOMPRESSION AND OPEN ROTATOR C Left 01/23/2013   Procedure: LEFT SHOULDER ARTHROSCOPY WITH SUBACROMIAL DECOMPRESSION AND MINI OPEN ROTATOR CUFF REPAIR, AND OPEN DISTAL CLAVICLE RESECTION;  Surgeon: Verlee Rossetti, MD;  Location: [facility] OR;  Service: Orthopedics;  Laterality: Left;  . SHOULDER SURGERY Right 05/2011  . SPACE OAR INSTILLATION N/A 07/03/2019   Procedure: SPACE OAR INSTILLATION;  Surgeon: Crist Fat, MD;  Location: [facility];  Service: Urology;  Laterality: N/A;  . TONSILLECTOMY  child  . TRIGGER FINGER RELEASE Right 12-12-2005   @[facility]    Right long finger      Current Outpatient Medications:  .  ammonium lactate (AMLACTIN) 12 % cream, Apply topically in the morning and at bedtime., Disp: , Rfl:  .  aspirin EC 81 MG tablet, Take 81 mg by mouth daily., Disp: , Rfl:  .  atorvastatin (LIPITOR) 80 MG tablet, TAKE 1 TABLET BY MOUTH DAILY, Disp: 90 tablet, Rfl: 3 .  baclofen (LIORESAL) 10 MG tablet, TAKE 1 TABLET BY MOUTH AT  BEDTIME AS NEEDED FOR MUSCLE  SPASM, Disp: 100 tablet, Rfl: 1 .  benzonatate (TESSALON) 100 MG capsule, Take 1 capsule (100 mg total) by mouth every 8 (eight) hours., Disp: 21 capsule, Rfl: 0 .  carvedilol (COREG) 6.25 MG tablet, TAKE 1 TABLET BY MOUTH TWICE  DAILY WITH  A MEAL, Disp: 180 tablet, Rfl: 2 .  clobetasol cream (TEMOVATE) 0.05 %, , Disp: , Rfl:  .  doxycycline (VIBRAMYCIN) 100 MG capsule, Take  1 capsule (100 mg total) by mouth 2 (two) times daily., Disp: 20 capsule, Rfl: 0 .  empagliflozin (JARDIANCE) 25 MG TABS tablet, Take 1 tablet (25 mg total) by mouth daily., Disp: 90 tablet, Rfl: 2 .  fluticasone (FLONASE) 50 MCG/ACT nasal spray, USE 2 SPRAYS IN BOTH NOSTRILS  DAILY, Disp: 48 g, Rfl: 2 .  gabapentin (NEURONTIN) 100 MG capsule, TAKE 1 CAPSULE BY MOUTH  TWICE DAILY, Disp: 180 capsule, Rfl: 3 .  glucose blood (ONETOUCH ULTRA) test strip, Use as instructed to check blood sugar 2 times a day, Disp: 200 strip, Rfl: 3 .  guaiFENesin (MUCINEX) 600 MG 12 hr tablet, Take 600 mg by mouth 2 (two) times daily as needed for cough., Disp: , Rfl:  .  losartan (COZAAR) 100 MG tablet, TAKE 1 TABLET BY MOUTH DAILY, Disp: 100 tablet, Rfl: 1 .  metFORMIN (GLUCOPHAGE) 1000 MG tablet, TAKE 1 TABLET BY MOUTH TWICE  DAILY WITH MEALS, Disp: 200 tablet, Rfl: 2 .  omeprazole (PRILOSEC) 40 MG capsule, TAKE 1 CAPSULE BY MOUTH DAILY, Disp: 90 capsule, Rfl: 3 .  oseltamivir (TAMIFLU) 75 MG capsule, Take 1 capsule (75 mg total) by mouth 2 (two) times daily., Disp: 10 capsule, Rfl: 0 .  oxyCODONE-acetaminophen (PERCOCET) 10-325 MG tablet, Take 1 tablet by mouth every 4 (four) hours as needed for pain., Disp: , Rfl:  .  polyvinyl alcohol (LIQUIFILM TEARS) 1.4 % ophthalmic solution, Place 1 drop into both eyes as needed for dry eyes., Disp: , Rfl:  .  Semaglutide,0.25 or 0.5MG /DOS, (OZEMPIC, 0.25 OR 0.5 MG/DOSE,) 2 MG/1.5ML SOPN, Inject 0.5 mg into the skin once a week., Disp: 1.5 mL, Rfl: 3 .  Semaglutide,0.25 or 0.5MG /DOS, (OZEMPIC, 0.25 OR 0.5 MG/DOSE,) 2 MG/3ML SOPN, Inject 0.25 mg into the skin once a week., Disp: 3 mL, Rfl: 0 .  sildenafil (VIAGRA) 100 MG tablet, Take 0.5-1 tablets (50-100 mg total) by mouth as needed for erectile dysfunction., Disp: 10 tablet, Rfl: 1 .  tamsulosin (FLOMAX) 0.4 MG CAPS capsule, Take 0.4 mg by mouth daily., Disp: , Rfl:   Current Facility-Administered Medications:  .   triamcinolone (KENALOG) 0.1 % cream, , Topical, BID, Regal, Kirstie Peri, DPM . triamcinolone cream   Topical BID     Physical Exam: There were no vitals taken for this visit.    Affect appropriate Healthy:  appears stated age HEENT: normal Neck supple with no adenopathy JVP normal no bruits no thyromegaly Lungs clear with no wheezing and good diaphragmatic motion Heart:  S1/S2 no murmur, no rub, gallop or click PMI normal Abdomen: benighn, BS positve, no tenderness, no AAA no bruit.  No HSM or HJR Distal pulses intact with no bruits No edema Neuro non-focal Skin warm and dry No muscular weakness   Labs:   Lab Results  Component Value Date   WBC 6.3 06/16/2023   HGB 13.7 06/16/2023   HCT 41.3 06/16/2023   MCV 91.6 06/16/2023   PLT 205 06/16/2023   No results for input(s): "NA", "K", "CL", "CO2", "BUN", "CREATININE", "CALCIUM", "PROT", "BILITOT", "ALKPHOS", "ALT", "AST", "GLUCOSE" in the last 168 hours.  Invalid input(s): "LABALBU" Lab Results  Component Value Date   CKTOTAL 4,038 (H) 11/03/2008   CKMB 2.4 11/02/2007   TROPONINI <0.03 08/26/2018    Lab Results  Component Value Date   CHOL  121 03/29/2023   CHOL 108 10/17/2021   CHOL 106 10/26/2020   Lab Results  Component Value Date   HDL 31.70 (L) 03/29/2023   HDL 41.30 10/17/2021   HDL 34.20 (L) 10/26/2020   Lab Results  Component Value Date   LDLCALC 72 03/29/2023   LDLCALC 52 10/17/2021   LDLCALC 59 10/26/2020   Lab Results  Component Value Date   TRIG 85.0 03/29/2023   TRIG 75.0 10/17/2021   TRIG 62.0 10/26/2020   Lab Results  Component Value Date   CHOLHDL 4 03/29/2023   CHOLHDL 3 10/17/2021   CHOLHDL 3 10/26/2020   Lab Results  Component Value Date   LDLDIRECT 104 (H) 12/26/2012   LDLDIRECT 162.4 03/02/2010      Radiology: No results found.  EKG: SR rate 65 low voltage 03/08/20 07/24/2023 SR rate 82 PVC low voltage    ASSESSMENT AND PLAN:   1. Chest Pain: non cardiac normal cath  08/29/2018 observe risk stratify further with calcium score  2. Aneurysm:  Dilated root by echo but normal on CTA 07/25/20 reviewed 3.  HTN:  improved on higher dose coreg/cozaar ***   4. DM:  Discussed low carb diet.  Target hemoglobin A1c is 6.5 or less.  Continue current medications. 5. HLD  On statin labs with primary   Calcium Score ***  F/u in a year    Signed: Charlton Haws 07/24/2023, 8:06 AM

## 2023-07-26 ENCOUNTER — Other Ambulatory Visit: Payer: Self-pay | Admitting: Family Medicine

## 2023-07-26 DIAGNOSIS — G8929 Other chronic pain: Secondary | ICD-10-CM

## 2023-07-26 NOTE — Telephone Encounter (Signed)
 Baclofen Last filled:  04/02/23, #100 Last OV:  07/17/23, 3 mo DM f/u Next OV:  10/18/23, 3 mo f/u

## 2023-07-29 DIAGNOSIS — Z79899 Other long term (current) drug therapy: Secondary | ICD-10-CM | POA: Diagnosis not present

## 2023-07-29 DIAGNOSIS — Z79891 Long term (current) use of opiate analgesic: Secondary | ICD-10-CM | POA: Diagnosis not present

## 2023-07-29 DIAGNOSIS — M5030 Other cervical disc degeneration, unspecified cervical region: Secondary | ICD-10-CM | POA: Diagnosis not present

## 2023-07-29 DIAGNOSIS — Z5181 Encounter for therapeutic drug level monitoring: Secondary | ICD-10-CM | POA: Diagnosis not present

## 2023-07-29 DIAGNOSIS — M961 Postlaminectomy syndrome, not elsewhere classified: Secondary | ICD-10-CM | POA: Diagnosis not present

## 2023-07-29 NOTE — Telephone Encounter (Signed)
 ERx

## 2023-07-31 ENCOUNTER — Ambulatory Visit: Payer: Medicare Other | Attending: Cardiovascular Disease | Admitting: Cardiovascular Disease

## 2023-08-05 ENCOUNTER — Telehealth: Payer: Self-pay | Admitting: Family Medicine

## 2023-08-05 NOTE — Telephone Encounter (Signed)
 Per 07/17/23 OV notes, pt is to take Jardiance 25 mg once a day.   Lvm asking pt to call back. Need to info above.

## 2023-08-05 NOTE — Telephone Encounter (Signed)
 Copied from CRM [id number]. Topic: Clinical - Medication Question >> Aug 05, 2023 10:44 AM Rosamond Comes wrote: Reason for CRM: patient calling in has questions about about empagliflozin (JARDIANCE) 25 MG TABS tablet and which dosage to take, 10mg  or 25mg   Please call patient  [phone] no voice mail set up   Chief Complaint: Medication Question  Additional Notes: spoke with patient and advised of dosage increase to Jardiance 25mg  per chart review

## 2023-08-06 ENCOUNTER — Encounter: Payer: Self-pay | Admitting: Pharmacist

## 2023-08-06 NOTE — Telephone Encounter (Addendum)
 Spoke with pt relaying info from OV notes about his current Jardiance dose. Pt verbalizes understanding and states 2 other people have called him to relay the same info. Offered my apologies for the duplicated info and so many calls due to no documentation pt was previously informed.   Closing encounter.

## 2023-08-06 NOTE — Progress Notes (Signed)
 Chart Review:  Date of review: 08/06/23 Patient was identified as falling into the [facility] for Diabetes control.   Last A1c: 7.6% (07/17/23) = At goal Next A1c Due: 10/15/23 Next Scheduled Visit: 10/18/23  Adherence consideration: - Metformin last filled 04/02/23 (refill overdue) Confirmed via pharmacy claims, metformin refilled 07/20/23 x100 ds  Last PharmD Chart Review: [facility] for Jardiance dose change per PCP visit. Patient confirms receipt of Jardiance 25 mg tablet per [facility] chart notes. Was instructed to increase to new strength.  Ozempic application submitted to [employer] PAP  Called Novo Nordisk today. Confirmed patient has been approved to receive all strengths of Ozempic through 04/22/24. Order is in process, has not yet shipped.   Pharmacy Considerations:  N/A - BG well-controlled.  Expect Ozempic delivery to take a couple of weeks. Upon future titration of Ozempic, reasonable to consider reduction/discontinuation of metformin to reduce pill burden if A1c allows.  Recommend continued plan for Ozempic regardless of A1c control given established ASCVD, OSA and FLD.   Follow Up:  Pharm chart review: 1 month  Future Appointments  Date Time Provider Department Center  10/18/2023  9:00 AM Claire Crick, MD [facility]  12/10/2023  1:40 PM [facility] ANNUAL WELLNESS VISIT 1 [facility]

## 2023-08-12 ENCOUNTER — Other Ambulatory Visit: Payer: Self-pay | Admitting: Cardiovascular Disease

## 2023-08-12 DIAGNOSIS — I10 Essential (primary) hypertension: Secondary | ICD-10-CM

## 2023-08-13 ENCOUNTER — Telehealth: Payer: Self-pay

## 2023-08-13 NOTE — Telephone Encounter (Signed)
 Received pt's Ozempic  0.25 mg/0.5mg  (2 boxes), Ozempic  1 mg (2 boxes)----(4 boxes total) shipment today.   Spoke with pt notify him we received above med shipment. Pt expresses his thanks and will pick up meds tomorrow.   [Placed Ozempic  (4 boxes total) in 2nd refrigerator.]

## 2023-08-16 ENCOUNTER — Telehealth: Payer: Self-pay

## 2023-08-16 NOTE — Telephone Encounter (Signed)
 Looks like pt picked up meds.

## 2023-08-16 NOTE — Telephone Encounter (Signed)
 Copied from CRM [id number]. Topic: Clinical - Medical Advice >> Aug 15, 2023  4:11 PM [place] J wrote: Reason for CRM: Patient received two different doses of insulin  yesterday and is not sure which one to take.   Callback: [phone]

## 2023-08-16 NOTE — Telephone Encounter (Signed)
 Patient called in to follow up on this. He is needing assistance. Thank you!

## 2023-08-16 NOTE — Telephone Encounter (Signed)
 Spoke with pt explaining he is to take Ozempic  0.5 mg weekly, THEN start 1 mg dose. Pt verbalizes understanding and expresses his thanks for the help.

## 2023-08-19 DIAGNOSIS — E119 Type 2 diabetes mellitus without complications: Secondary | ICD-10-CM | POA: Diagnosis not present

## 2023-08-19 DIAGNOSIS — H40013 Open angle with borderline findings, low risk, bilateral: Secondary | ICD-10-CM | POA: Diagnosis not present

## 2023-08-19 DIAGNOSIS — H16223 Keratoconjunctivitis sicca, not specified as Sjogren's, bilateral: Secondary | ICD-10-CM | POA: Diagnosis not present

## 2023-08-19 DIAGNOSIS — H1045 Other chronic allergic conjunctivitis: Secondary | ICD-10-CM | POA: Diagnosis not present

## 2023-08-19 DIAGNOSIS — H35372 Puckering of macula, left eye: Secondary | ICD-10-CM | POA: Diagnosis not present

## 2023-08-19 DIAGNOSIS — H43811 Vitreous degeneration, right eye: Secondary | ICD-10-CM | POA: Diagnosis not present

## 2023-08-19 DIAGNOSIS — H353131 Nonexudative age-related macular degeneration, bilateral, early dry stage: Secondary | ICD-10-CM | POA: Diagnosis not present

## 2023-08-19 LAB — HM DIABETES EYE EXAM

## 2023-09-27 ENCOUNTER — Other Ambulatory Visit: Payer: Self-pay | Admitting: Family Medicine

## 2023-09-27 DIAGNOSIS — E118 Type 2 diabetes mellitus with unspecified complications: Secondary | ICD-10-CM

## 2023-10-02 ENCOUNTER — Other Ambulatory Visit: Payer: Self-pay | Admitting: Cardiovascular Disease

## 2023-10-02 DIAGNOSIS — I10 Essential (primary) hypertension: Secondary | ICD-10-CM

## 2023-10-18 ENCOUNTER — Ambulatory Visit: Admitting: Family Medicine

## 2023-10-18 DIAGNOSIS — E1169 Type 2 diabetes mellitus with other specified complication: Secondary | ICD-10-CM

## 2023-10-30 ENCOUNTER — Other Ambulatory Visit: Payer: Self-pay | Admitting: Cardiovascular Disease

## 2023-10-30 ENCOUNTER — Telehealth: Payer: Self-pay

## 2023-10-30 DIAGNOSIS — I10 Essential (primary) hypertension: Secondary | ICD-10-CM

## 2023-10-30 DIAGNOSIS — E1169 Type 2 diabetes mellitus with other specified complication: Secondary | ICD-10-CM

## 2023-10-30 NOTE — Telephone Encounter (Signed)
 Copied from CRM [id number]. Topic: Clinical - Medication Question >> Oct 30, 2023  9:43 AM Donna BRAVO wrote: Reason for CRM: patient calling in received letter from [facility] concerning coverage for Onetouch strips and meter. Patient would like to speak with someone about changing to another brand  Patient phone [phone]

## 2023-10-30 NOTE — Telephone Encounter (Signed)
 Returned pt's call asking if he was told what brand they now cover. Pt states the letter doesn't say. But he will call to find then let us  know.

## 2023-10-31 MED ORDER — ACCU-CHEK GUIDE W/DEVICE KIT: PACK | 0 refills | Status: AC

## 2023-10-31 MED ORDER — ACCU-CHEK GUIDE TEST VI STRP: ORAL_STRIP | 3 refills | Status: AC

## 2023-10-31 MED ORDER — ACCU-CHEK FASTCLIX LANCETS MISC
3 refills | Status: DC
Start: 2023-10-31 — End: 2023-11-11

## 2023-10-31 NOTE — Telephone Encounter (Unsigned)
 Copied from CRM [id number]. Topic: Clinical - Prescription Issue >> Oct 30, 2023  5:26 PM Rea BROCKS wrote: Reason for CRM: Hazel from [facility] called in and stated that all OneTouch products are no longer covered. She stated AccuCheck test strips/meters are covered by [facility].

## 2023-10-31 NOTE — Telephone Encounter (Signed)
 Noted.   E-scribed rxs for Accu-Chek Guide meter, test strips and Accu-Chek Fastclix lancets to [facility].

## 2023-11-08 DIAGNOSIS — M961 Postlaminectomy syndrome, not elsewhere classified: Secondary | ICD-10-CM | POA: Diagnosis not present

## 2023-11-08 DIAGNOSIS — M5412 Radiculopathy, cervical region: Secondary | ICD-10-CM | POA: Diagnosis not present

## 2023-11-11 ENCOUNTER — Other Ambulatory Visit: Payer: Self-pay

## 2023-11-11 DIAGNOSIS — E1169 Type 2 diabetes mellitus with other specified complication: Secondary | ICD-10-CM

## 2023-11-11 MED ORDER — ACCU-CHEK SOFTCLIX LANCETS MISC: 3 refills | Status: AC

## 2023-11-11 NOTE — Telephone Encounter (Signed)
 Faxed message from [facility] that rx for Accu-Chek Fastclix lancets needs to be changed to Accu-Chek Softclix lancets.   E-scribed Softclix lancets.

## 2023-11-13 ENCOUNTER — Encounter: Payer: Self-pay | Admitting: Family Medicine

## 2023-11-13 ENCOUNTER — Ambulatory Visit: Admitting: Family Medicine

## 2023-11-13 VITALS — BP 138/78 | HR 67 | Temp 98.0°F | Ht 67.0 in | Wt 230.5 lb

## 2023-11-13 DIAGNOSIS — E1169 Type 2 diabetes mellitus with other specified complication: Secondary | ICD-10-CM | POA: Diagnosis not present

## 2023-11-13 DIAGNOSIS — G3184 Mild cognitive impairment, so stated: Secondary | ICD-10-CM

## 2023-11-13 LAB — POCT GLYCOSYLATED HEMOGLOBIN (HGB A1C): Hemoglobin A1C: 6.9 % — AB (ref 4.0–5.6)

## 2023-11-13 LAB — MICROALBUMIN / CREATININE URINE RATIO
Creatinine,U: 64.6 mg/dL
Microalb Creat Ratio: 102 mg/g — ABNORMAL HIGH (ref 0.0–30.0)
Microalb, Ur: 6.6 mg/dL — ABNORMAL HIGH (ref 0.0–1.9)

## 2023-11-13 NOTE — Assessment & Plan Note (Addendum)
 Testing today consistent with mild impairment - possible contribution of education - struggled in high school.  Not affecting ability to things.  Discussed with patient, reviewed strategies to help support a healthy mind as per instructions. Will continue to monitor at this time.

## 2023-11-13 NOTE — Assessment & Plan Note (Addendum)
 Chronic, stable. Great control on current regimen - continue this.  Update Umicroalb/cr - he is already on jardiance  and losartan .

## 2023-11-13 NOTE — Patient Instructions (Addendum)
 Memory testing with some impairment noted, but not in dementia range. We will keep an eye on this.  Call Dr Delford for appointment. [phone] Good to see you today Sugars are doing great! Continue current medicines  Return as needed or in 5 months for physical (after 04/04/2024).   Work on 4 core lifestyle modifications to support a healthy mind:  1. Nutritious well balance diet.  2. Regular physical activity routine.  3. Regular mental activity such as reading books, word puzzles, math puzzles, jigsaw puzzles.  4. Social engagement.  Also ensure good blood pressure control, limit alcohol, no smoking.

## 2023-11-13 NOTE — Progress Notes (Signed)
 Ph: [phone] Fax: [phone]   Patient ID: [name], male    DOB: 1951-04-03, 73 y.o.   MRN: [id number]  This visit was conducted in person.  BP 138/78   Pulse 67   Temp 98 F (36.7 C) (Oral)   Ht 5' 7 (1.702 m)   Wt 230 lb 8 oz (104.6 kg)   SpO2 100%   BMI 36.10 kg/m    CC: DM f/u visit  Subjective:   HPI: [name] is a 73 y.o. male presenting on 11/13/2023 for Medical Management of Chronic Issues (3 mt DM f/u/Pt accompanied by his sister Ronal)   Wants to discuss memory concerns - walked out of store and lfet his wallet on counter, another time left bank card which he lost, has left door unlocked, door opened. Noticing trouble over the past year.   Ongoing cervical radiculopathy symptoms. Decided not to proceed with neck surgery but rather continue pain medication. Sees Dr Burnetta and Dr Bonner for this.   DM - does regularly check sugars 119 this morning. Compliant with antihyperglycemic regimen which includes: jardiance  25mg  daily, metformin  1000mg  bid, ozempic  0.5mg  weekly. Tolerating ozempic  well. Tolerating jardiance  without UTI or yeast infection symptoms. He's been juicing more. Denies low sugars or hypoglycemic symptoms. Denies paresthesias, blurry vision. Last diabetic eye exam 07/2023. Glucometer brand: accu-chek guide. Last foot exam: 12/2022. DSME: previously declined.  Lab Results  Component Value Date   HGBA1C 6.9 (A) 11/13/2023   Diabetic Foot Exam - Simple   No data filed    Lab Results  Component Value Date   MICROALBUR 3.5 (H) 03/02/2010    Geriatric Assessment: Activities of Daily Living:     Bathing- independent     Dressing-independent     Eating-independent     Toileting-independent     Transferring-independent     Continence-independent  Overall Assessment:independent   Instrumental Activities of Daily Living:     Transportation-independent     Meal/Food Preparation-independent     Shopping Errands-independent      Housekeeping/Chores-independent     Money Management/Finances-independent     Medication Management-independent     Ability to Use Telephone-independent     Laundry-independent  Overall Assessment: independent   Mental Status Exam: 26/30 (value/max value)  Clock Drawing Score: 3/4   Education - completed 12th grade - did struggle with high school      Relevant past medical, surgical, family and social history reviewed and updated as indicated. Interim medical history since our last visit reviewed. Allergies and medications reviewed and updated. Outpatient Medications Prior to Visit  Medication Sig Dispense Refill   Accu-Chek Softclix Lancets lancets Use as instructed 100 each 3   ammonium lactate (AMLACTIN) 12 % cream Apply topically in the morning and at bedtime.     aspirin  EC 81 MG tablet Take 81 mg by mouth daily.     atorvastatin  (LIPITOR) 80 MG tablet TAKE 1 TABLET BY MOUTH DAILY 90 tablet 3   baclofen  (LIORESAL ) 10 MG tablet TAKE 1 TABLET BY MOUTH AT  BEDTIME AS NEEDED FOR MUSCLE  SPASM 100 tablet 1   benzonatate  (TESSALON ) 100 MG capsule Take 1 capsule (100 mg total) by mouth every 8 (eight) hours. 21 capsule 0   Blood Glucose Monitoring Suppl (ACCU-CHEK GUIDE) w/Device KIT Use as instructed to check blood sugar once a day 1 kit 0   carvedilol  (COREG ) 6.25 MG tablet TAKE 1 TABLET BY MOUTH TWICE  DAILY WITH A MEAL 30  tablet 0   clobetasol cream (TEMOVATE) 0.05 %      empagliflozin  (JARDIANCE ) 25 MG TABS tablet Take 1 tablet (25 mg total) by mouth daily. 90 tablet 2   fluticasone  (FLONASE ) 50 MCG/ACT nasal spray USE 2 SPRAYS IN BOTH NOSTRILS  DAILY 48 g 2   gabapentin  (NEURONTIN ) 100 MG capsule TAKE 1 CAPSULE BY MOUTH  TWICE DAILY 180 capsule 3   glucose blood (ACCU-CHEK GUIDE TEST) test strip Use as instructed to check blood sugar once a day 100 each 3   guaiFENesin (MUCINEX) 600 MG 12 hr tablet Take 600 mg by mouth 2 (two) times daily as needed for cough.     losartan  (COZAAR )  100 MG tablet TAKE 1 TABLET BY MOUTH DAILY 100 tablet 1   metFORMIN  (GLUCOPHAGE ) 1000 MG tablet TAKE 1 TABLET BY MOUTH TWICE  DAILY WITH MEALS 200 tablet 2   omeprazole  (PRILOSEC) 40 MG capsule TAKE 1 CAPSULE BY MOUTH DAILY 90 capsule 3   oseltamivir  (TAMIFLU ) 75 MG capsule Take 1 capsule (75 mg total) by mouth 2 (two) times daily. 10 capsule 0   oxyCODONE -acetaminophen  (PERCOCET) 10-325 MG tablet Take 1 tablet by mouth every 4 (four) hours as needed for pain.     polyvinyl alcohol (LIQUIFILM TEARS) 1.4 % ophthalmic solution Place 1 drop into both eyes as needed for dry eyes.     Semaglutide ,0.25 or 0.5MG /DOS, (OZEMPIC , 0.25 OR 0.5 MG/DOSE,) 2 MG/1.5ML SOPN Inject 0.5 mg into the skin once a week. 1.5 mL 3   Semaglutide ,0.25 or 0.5MG /DOS, (OZEMPIC , 0.25 OR 0.5 MG/DOSE,) 2 MG/3ML SOPN Inject 0.25 mg into the skin once a week. 3 mL 0   sildenafil  (VIAGRA ) 100 MG tablet Take 0.5-1 tablets (50-100 mg total) by mouth as needed for erectile dysfunction. 10 tablet 1   tamsulosin  (FLOMAX ) 0.4 MG CAPS capsule Take 0.4 mg by mouth daily.     doxycycline  (VIBRAMYCIN ) 100 MG capsule Take 1 capsule (100 mg total) by mouth 2 (two) times daily. 20 capsule 0   Facility-Administered Medications Prior to Visit  Medication Dose Route Frequency Provider Last Rate Last Admin   triamcinolone  (KENALOG ) 0.1 % cream   Topical BID Magdalen Pasco RAMAN, DPM         Per HPI unless specifically indicated in ROS section below Review of Systems  Objective:  BP 138/78   Pulse 67   Temp 98 F (36.7 C) (Oral)   Ht 5' 7 (1.702 m)   Wt 230 lb 8 oz (104.6 kg)   SpO2 100%   BMI 36.10 kg/m   Wt Readings from Last 3 Encounters:  11/13/23 230 lb 8 oz (104.6 kg)  07/17/23 228 lb 4 oz (103.5 kg)  06/18/23 229 lb (103.9 kg)      Physical Exam Vitals and nursing note reviewed.  Constitutional:      Appearance: Normal appearance. He is not ill-appearing.  HENT:     Mouth/Throat:     Mouth: Mucous membranes are moist.      Pharynx: Oropharynx is clear. No oropharyngeal exudate or posterior oropharyngeal erythema.  Eyes:     Extraocular Movements: Extraocular movements intact.     Conjunctiva/sclera: Conjunctivae normal.     Pupils: Pupils are equal, round, and reactive to light.  Cardiovascular:     Rate and Rhythm: Normal rate and regular rhythm.     Pulses: Normal pulses.     Heart sounds: Normal heart sounds. No murmur heard. Pulmonary:     Effort: Pulmonary effort  is normal. No respiratory distress.     Breath sounds: Normal breath sounds. No wheezing, rhonchi or rales.  Musculoskeletal:     Right lower leg: No edema.     Left lower leg: No edema.     Comments: See HPI for foot exam if done  Skin:    General: Skin is warm and dry.     Findings: No rash.  Neurological:     Mental Status: He is alert.  Psychiatric:        Mood and Affect: Mood normal.        Behavior: Behavior normal.       Results for orders placed or performed in visit on 11/13/23  POCT glycosylated hemoglobin (Hb A1C)   Collection Time: 11/13/23  9:03 AM  Result Value Ref Range   Hemoglobin A1C 6.9 (A) 4.0 - 5.6 %   HbA1c POC (<> result, manual entry)     HbA1c, POC (prediabetic range)     HbA1c, POC (controlled diabetic range)      Assessment & Plan:   Problem List Items Addressed This Visit     Type 2 diabetes mellitus with other specified complication (HCC) - Primary   Chronic, stable. Great control on current regimen - continue this.  Update Umicroalb/cr - he is already on jardiance  and losartan .       Relevant Orders   POCT glycosylated hemoglobin (Hb A1C) (Completed)   Microalbumin / creatinine urine ratio   MCI (mild cognitive impairment) with memory loss   Testing today consistent with mild impairment - possible contribution of education - struggled in high school.  Not affecting ability to things.  Discussed with patient, reviewed strategies to help support a healthy mind as per instructions. Will  continue to monitor at this time.         No orders of the defined types were placed in this encounter.   Orders Placed This Encounter  Procedures   Microalbumin / creatinine urine ratio   POCT glycosylated hemoglobin (Hb A1C)    Patient Instructions  Memory testing with some impairment noted, but not in dementia range. We will keep an eye on this.  Call Dr Delford for appointment. [phone] Good to see you today Sugars are doing great! Continue current medicines  Return as needed or in 5 months for physical (after 04/04/2024).   Work on 4 core lifestyle modifications to support a healthy mind:  1. Nutritious well balance diet.  2. Regular physical activity routine.  3. Regular mental activity such as reading books, word puzzles, math puzzles, jigsaw puzzles.  4. Social engagement.  Also ensure good blood pressure control, limit alcohol, no smoking.    Follow up plan: Return in about 5 months (around 04/05/2024).  Anton Blas, MD

## 2023-11-14 ENCOUNTER — Telehealth: Payer: Self-pay

## 2023-11-14 NOTE — Telephone Encounter (Signed)
 Copied from CRM #[id number]. Topic: General - Other >> Nov 13, 2023 12:56 PM Carlyon D wrote: Reason for CRM: Pt is calling stating his pcp was going to give him a number to his heart dr for his heart medication. He said he needs some one in office to reach out to him as soon as possible in regards to him getting this phone number for the heart Dr.

## 2023-11-15 NOTE — Telephone Encounter (Signed)
 Called patient given information will call if any further questions.

## 2023-11-16 ENCOUNTER — Other Ambulatory Visit: Payer: Self-pay | Admitting: Cardiovascular Disease

## 2023-11-16 DIAGNOSIS — I10 Essential (primary) hypertension: Secondary | ICD-10-CM

## 2023-11-17 ENCOUNTER — Ambulatory Visit: Payer: Self-pay | Admitting: Family Medicine

## 2023-11-18 ENCOUNTER — Telehealth: Payer: Self-pay | Admitting: Cardiovascular Disease

## 2023-11-18 DIAGNOSIS — I10 Essential (primary) hypertension: Secondary | ICD-10-CM

## 2023-11-18 MED ORDER — CARVEDILOL 6.25 MG PO TABS: 6.2500 mg | ORAL_TABLET | Freq: Two times a day (BID) | ORAL | 0 refills | Status: DC

## 2023-11-18 NOTE — Telephone Encounter (Signed)
*  STAT* If patient is at the pharmacy, call can be transferred to refill team.   1. Which medications need to be refilled? (please list name of each medication and dose if known) carvedilol  (COREG ) 6.25 MG tablet  TAKE 1 TABLET BY MOUTH TWICE DAILY WITH A MEAL   2. Would you like to learn more about the convenience, safety, & potential cost savings by using the [facility]? No   3. Are you open to using the [facility] No  4. Which pharmacy/location (including street and city if local pharmacy) is medication to be sent to?[facility] - [place], [place] - [address] [place]    5. Do they need a 30 day or 90 day supply? 90 Day Supply  Pt is scheduled with Dr. Delford 02/13/24.

## 2023-12-10 ENCOUNTER — Ambulatory Visit (INDEPENDENT_AMBULATORY_CARE_PROVIDER_SITE_OTHER): Payer: Medicare Other

## 2023-12-10 VITALS — Ht 67.0 in | Wt 230.0 lb

## 2023-12-10 DIAGNOSIS — Z0000 Encounter for general adult medical examination without abnormal findings: Secondary | ICD-10-CM | POA: Diagnosis not present

## 2023-12-10 NOTE — Patient Instructions (Signed)
 [name] , Thank you for taking time out of your busy schedule to complete your Annual Wellness Visit with me. I enjoyed our conversation and look forward to speaking with you again next year. I, as well as your care team,  appreciate your ongoing commitment to your health goals. Please review the following plan we discussed and let me know if I can assist you in the future. Your Game plan/ To Do List    Referrals: If you haven't heard from the office you've been referred to, please reach out to them at the phone provided.   Follow up Visits: We will see or speak with you next year for your Next Medicare AWV with our clinical staff-12/10/24 @ 1:40pm televisit Have you seen your provider in the last 6 months (3 months if uncontrolled diabetes)? Yes  Clinician Recommendations:  Aim for 30 minutes of exercise or brisk walking, 6-8 glasses of water, and 5 servings of fruits and vegetables each day.       This is a list of the screenings recommended for you:  Health Maintenance  Topic Date Due   Zoster (Shingles) Vaccine (1 of 2) 02/14/1969   COVID-19 Vaccine (3 - Pfizer risk series) 10/10/2019   Flu Shot  11/22/2023   Complete foot exam   01/04/2024   DTaP/Tdap/Td vaccine (3 - Td or Tdap) 01/27/2024   Hemoglobin A1C  05/15/2024   Yearly kidney function blood test for diabetes  06/15/2024   Eye exam for diabetics  08/18/2024   Yearly kidney health urinalysis for diabetes  11/12/2024   Medicare Annual Wellness Visit  12/09/2024   Colon Cancer Screening  12/29/2028   Pneumococcal Vaccine for age over 47  Completed   Hepatitis C Screening  Completed   HPV Vaccine  Aged Out   Meningitis B Vaccine  Aged Out   Pneumococcal Vaccine  Discontinued    Advanced directives: (Copy Requested) Please bring a copy of your health care power of attorney and living will to the office to be added to your chart at your convenience. You can mail to [facility] [address]  [place], [place] [address] or email to [email or web address] Advance Care Planning is important because it:  [x]  Makes sure you receive the medical care that is consistent with your values, goals, and preferences  [x]  It provides guidance to your family and loved ones and reduces their decisional burden about whether or not they are making the right decisions based on your wishes.  Follow the link provided in your after visit summary or read over the paperwork we have mailed to you to help you started getting your Advance Directives in place. If you need assistance in completing these, please reach out to us  so that we can help you!

## 2023-12-10 NOTE — Progress Notes (Signed)
 Subjective:   [name] is a 73 y.o. who presents for a Medicare Wellness preventive visit.  As a reminder, Annual Wellness Visits don't include a physical exam, and some assessments may be limited, especially if this visit is performed virtually. We may recommend an in-person follow-up visit with your provider if needed.  Visit Complete: Virtual I connected with  [name] on 12/10/23 by a audio enabled telemedicine application and verified that I am speaking with the correct person using two identifiers.  Patient Location: Home  Provider Location: Office/Clinic  I discussed the limitations of evaluation and management by telemedicine. The patient expressed understanding and agreed to proceed.  Vital Signs: Because this visit was a virtual/telehealth visit, some criteria may be missing or patient reported. Any vitals not documented were not able to be obtained and vitals that have been documented are patient reported.  VideoDeclined- This patient declined Librarian, academic. Therefore the visit was completed with audio only.  Persons Participating in Visit: Patient.  AWV Questionnaire: No: Patient Medicare AWV questionnaire was not completed prior to this visit.  Cardiac Risk Factors include: advanced age (>10men, >37 women);diabetes mellitus;dyslipidemia;hypertension;male gender;obesity (BMI >30kg/m2);sedentary lifestyle     Objective:    Today's Vitals   12/10/23 1336 12/10/23 1337  Weight: 230 lb (104.3 kg)   Height: 5' 7 (1.702 m)   PainSc:  8    Body mass index is 36.02 kg/m.     12/10/2023    1:48 PM 12/04/2022   10:19 AM 04/13/2021    4:51 AM 11/21/2020    5:24 PM 03/07/2020    1:23 PM 12/13/2019   11:28 AM 10/29/2019    9:18 AM  Advanced Directives  Does Patient Have a Medical Advance Directive? Yes No No Yes No No No  Type of Estate agent of [place];Living will   Healthcare Power of Attorney      Copy of Healthcare Power of Attorney in Chart? No - copy requested        Would patient like information on creating a medical advance directive?  No - Patient declined No - Patient declined    No - Patient declined    Current Medications (verified) Outpatient Encounter Medications as of 12/10/2023  Medication Sig   Accu-Chek Softclix Lancets lancets Use as instructed   ammonium lactate (AMLACTIN) 12 % cream Apply topically in the morning and at bedtime.   aspirin  EC 81 MG tablet Take 81 mg by mouth daily.   atorvastatin  (LIPITOR) 80 MG tablet TAKE 1 TABLET BY MOUTH DAILY   baclofen  (LIORESAL ) 10 MG tablet TAKE 1 TABLET BY MOUTH AT  BEDTIME AS NEEDED FOR MUSCLE  SPASM   Blood Glucose Monitoring Suppl (ACCU-CHEK GUIDE) w/Device KIT Use as instructed to check blood sugar once a day   carvedilol  (COREG ) 6.25 MG tablet Take 1 tablet (6.25 mg total) by mouth 2 (two) times daily with a meal.   clobetasol cream (TEMOVATE) 0.05 %    empagliflozin  (JARDIANCE ) 25 MG TABS tablet Take 1 tablet (25 mg total) by mouth daily.   fluticasone  (FLONASE ) 50 MCG/ACT nasal spray USE 2 SPRAYS IN BOTH NOSTRILS  DAILY   gabapentin  (NEURONTIN ) 100 MG capsule TAKE 1 CAPSULE BY MOUTH  TWICE DAILY   glucose blood (ACCU-CHEK GUIDE TEST) test strip Use as instructed to check blood sugar once a day   guaiFENesin (MUCINEX) 600 MG 12 hr tablet Take 600 mg by mouth 2 (two) times daily as  needed for cough.   losartan  (COZAAR ) 100 MG tablet TAKE 1 TABLET BY MOUTH DAILY   metFORMIN  (GLUCOPHAGE ) 1000 MG tablet TAKE 1 TABLET BY MOUTH TWICE  DAILY WITH MEALS   omeprazole  (PRILOSEC) 40 MG capsule TAKE 1 CAPSULE BY MOUTH DAILY   oseltamivir  (TAMIFLU ) 75 MG capsule Take 1 capsule (75 mg total) by mouth 2 (two) times daily.   oxyCODONE -acetaminophen  (PERCOCET) 10-325 MG tablet Take 1 tablet by mouth every 4 (four) hours as needed for pain.   polyvinyl alcohol (LIQUIFILM TEARS) 1.4 % ophthalmic solution Place 1 drop into both eyes as  needed for dry eyes.   Semaglutide ,0.25 or 0.5MG /DOS, (OZEMPIC , 0.25 OR 0.5 MG/DOSE,) 2 MG/1.5ML SOPN Inject 0.5 mg into the skin once a week.   Semaglutide ,0.25 or 0.5MG /DOS, (OZEMPIC , 0.25 OR 0.5 MG/DOSE,) 2 MG/3ML SOPN Inject 0.25 mg into the skin once a week.   sildenafil  (VIAGRA ) 100 MG tablet Take 0.5-1 tablets (50-100 mg total) by mouth as needed for erectile dysfunction.   tamsulosin  (FLOMAX ) 0.4 MG CAPS capsule Take 0.4 mg by mouth daily.   benzonatate  (TESSALON ) 100 MG capsule Take 1 capsule (100 mg total) by mouth every 8 (eight) hours. (Patient not taking: Reported on 12/10/2023)   Facility-Administered Encounter Medications as of 12/10/2023  Medication   triamcinolone  (KENALOG ) 0.1 % cream    Allergies (verified) Glimepiride  and Menthol  (topical analgesic)   History: Past Medical History:  Diagnosis Date   Acquired dilation of ascending aorta and aortic root (HCC)    43mm ascending aorta   Allergic rhinitis    Anxiety    Atherosclerosis of aorta (HCC)    CAD (coronary artery disease) cardiologist-- dr wilbert turner   long hx atypical chest pain--- cardiac cath 08-24-2002 in epic showed normal coronaries;   03-17-2007  nuclear stress test showed ?small infarct , ef 60%;  cath done 12-31-2007 for recurrent CP showed nonob cad;   08-27-2018 stress test showed intermediate risk w/ apex ischemia, nuclear ef 61%;   cath done 08-29-2018 showed Nonobstructive 25% plaque in LAD    Chronic neck pain    narcotic dependant   DDD (degenerative disc disease), cervical    Depression    Diabetic neuropathy (HCC)    Diverticulosis 2013   by CT scan   Erectile dysfunction 08/01/2015   Essential hypertension 01/01/2008   Fatty liver 07/31/2018   By US  07/2018,  followed by pcp   GERD (gastroesophageal reflux disease)    Hemorrhoids    History of anemia    History of cocaine abuse (HCC)    per pt quit 05/ 2004   History of cocaine use 2004   History of colon polyps    History of  epididymitis    History of esophageal stricture    s/p  dilatation 2009;  12-30-2018   History of gastric ulcer    12-30-2018  EGD showed non-bleeding gastric ulcer   History of prostatitis 08/2011   History of transient ischemic attack (TIA)    remote hx , related to cocaine abuse which pt quit 2005   Hyperlipidemia    Hypertension    Essential   Lipoma of neck 10/01/2014   S/p excision     OSA (obstructive sleep apnea)    study 05-07-2008 in epic , mild ,  pt does not use cpap   Pain due to onychomycosis of toenails of both feet 10/15/2018   Prostate cancer [facility]) urologist--- dr herrick/  oncologist--- dr patrcia   first dx 01/  2018 w/ Stage T2a and Gleason 3+3 , active survillance;  repear bx 04-21-2019 Gleason 3+4,  scheduled for brachytherapy 07-03-2019   T2DM (type 2 diabetes mellitus) (HCC)    followed by pcp---  (03-04-201  checks blood sugar 3 times daily,  fasting sugar's-- 140-150   Past Surgical History:  Procedure Laterality Date   ANTERIOR CERVICAL DECOMP/DISCECTOMY FUSION  06-24-2008  and 10-28-2008 @[facility]    06-25-2008  C7 -- T1;    10-28-2008  C5 -- C7   CARDIAC CATHETERIZATION  08-24-2002   dr gamble  @[facility]    normal coronaries   CARDIAC CATHETERIZATION  12-31-2007    dr delford   nonobstructive CAD   CARPAL TUNNEL RELEASE Right 2000  approx.   CATARACT EXTRACTION W/ INTRAOCULAR LENS  IMPLANT, BILATERAL  2016   COLONOSCOPY  10/11/2011   severe diverticulosis, int hemorrhoids, rec rpt 5 yrs Ollen)   COLONOSCOPY  12/2018   benign rectal polyp Ollen)   DIRECT LARYNGOSCOPY  11/02/2008   @[facility]    Extubation under anesthesia post op complication from ACDF   ESOPHAGOGASTRODUODENOSCOPY  10/11/2011   WNL Ollen)   ESOPHAGOGASTRODUODENOSCOPY  09/11-2018   gastric ulcer, gastritis, dilated esophageal stenosis, limit NSAIDs continue PPI, no f/u planned Ollen)   EXCISION MASS NECK N/A 08/29/2016   Procedure: EXCISION MASS POSTERIOR NECK;  Surgeon: Vanderbilt Ned, MD;   Location: [facility];  Service: General;  Laterality: N/A;   HEMATOMA EVACUATION  10/29/2008   @[facility]    post op ACDF 10-28-2008   LAPAROSCOPIC INGUINAL HERNIA REPAIR Bilateral 11-22-2006   @[facility]    AND EXCISION RLQ ABDOMINAL LIPOMA   LEFT HEART CATH AND CORONARY ANGIOGRAPHY N/A 08/29/2018   Procedure: LEFT HEART CATH AND CORONARY ANGIOGRAPHY;  Surgeon: Dann Candyce RAMAN, MD;  Location: [facility];  Service: Cardiovascular;  Laterality: N/A;   RADIOACTIVE SEED IMPLANT N/A 07/03/2019   Procedure: RADIOACTIVE SEED IMPLANT/BRACHYTHERAPY IMPLANT;  Surgeon: Cam Morene ORN, MD;  Location[facility];  Service: Urology;  Laterality: N/A;   SHOULDER ARTHROSCOPY WITH SUBACROMIAL DECOMPRESSION AND OPEN ROTATOR C Left 01/23/2013   Procedure: LEFT SHOULDER ARTHROSCOPY WITH SUBACROMIAL DECOMPRESSION AND MINI OPEN ROTATOR CUFF REPAIR, AND OPEN DISTAL CLAVICLE RESECTION;  Surgeon: Elspeth JONELLE Her, MD;  Location: [facility];  Service: Orthopedics;  Laterality: Left;   SHOULDER SURGERY Right 05/2011   SPACE OAR INSTILLATION N/A 07/03/2019   Procedure: SPACE OAR INSTILLATION;  Surgeon: Cam Morene ORN, MD;  Location: [facility];  Service: Urology;  Laterality: N/A;   TONSILLECTOMY  child   TRIGGER FINGER RELEASE Right 12-12-2005   @[facility]    Right long finger   Family History  Problem Relation Age of Onset   Diabetes Mother    Hypertension Mother    Hyperlipidemia Mother    Diabetes Father    Diabetes Sister    Hypertension Sister    Hypertension Brother    Diabetes Brother    Diabetes Brother    Hypertension Brother    Diabetes Brother    Heart disease Maternal Uncle        Heart failure   Cancer Paternal Uncle        Prostate   Cancer Paternal Grandfather        Prostate   Stroke Neg Hx    Esophageal cancer Neg Hx    Rectal cancer Neg Hx    Stomach cancer Neg Hx    Breast cancer Neg Hx    Colon cancer Neg Hx  Social History    Socioeconomic History   Marital status: Divorced    Spouse name: Not on file   Number of children: 1   Years of education: Not on file   Highest education level: Not on file  Occupational History   Occupation: [name] previously    Associate Professor: UNEMPLOYED    Comment: Now on disability after neck surgery  Tobacco Use   Smoking status: Never   Smokeless tobacco: Never  Vaping Use   Vaping status: Never Used  Substance and Sexual Activity   Alcohol use: No    Alcohol/week: 0.0 standard drinks of alcohol   Drug use: Not Currently    Comment: H/O marijuana (last 2005); Cocaine 5/04   Sexual activity: Not on file  Other Topics Concern   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   Activity: no regular exercise, wants to start walking   Diet: good water, fruits/vegetables daily.      Retired   1 daughter and 1 granddaughter   Social Drivers of Corporate investment banker Strain: Low Risk  (12/10/2023)   Overall Financial Resource Strain (CARDIA)    Difficulty of Paying Living Expenses: Not hard at all  Food Insecurity: No Food Insecurity (12/10/2023)   Hunger Vital Sign    Worried About Running Out of Food in the Last Year: Never true    Ran Out of Food in the Last Year: Never true  Transportation Needs: No Transportation Needs (12/10/2023)   PRAPARE - Administrator, Civil Service (Medical): No    Lack of Transportation (Non-Medical): No  Physical Activity: Inactive (12/10/2023)   Exercise Vital Sign    Days of Exercise per Week: 0 days    Minutes of Exercise per Session: 0 min  Stress: No Stress Concern Present (12/10/2023)   [employer] of Occupational Health - Occupational Stress Questionnaire    Feeling of  Stress: Not at all  Social Connections: Socially Isolated (12/10/2023)   Social Connection and Isolation Panel    Frequency of Communication with Friends and Family: More than three times a week    Frequency of Social Gatherings with Friends and Family: More than three times a week    Attends Religious Services: Never    Database administrator or Organizations: No    Attends Engineer, structural: Never    Marital Status: Divorced    Tobacco Counseling Counseling given: Not Answered    Clinical Intake:  Pre-visit preparation completed: Yes  Pain : 0-10 Pain Score: 8  Pain Type: Neuropathic pain Pain Location: Neck (shoulders and neck) Pain Descriptors / Indicators: Aching Pain Onset: More than a month ago Pain Frequency: Intermittent Pain Relieving Factors: medications, analgesic creme Effect of Pain on Daily Activities: limits activities  Pain Relieving Factors: medications, analgesic creme  BMI - recorded: 36.02 Nutritional Status: BMI > 30  Obese Nutritional Risks: None Diabetes: Yes CBG done?: No Did pt. bring in CBG monitor from home?: No  Lab Results  Component Value Date   HGBA1C 6.9 (A) 11/13/2023   HGBA1C 7.6 (A) 07/17/2023   HGBA1C 8.4 (H) 03/29/2023     How often do you need to have someone help you when you read instructions,  pamphlets, or other written materials from your doctor or pharmacy?: 1 - Never  Interpreter Needed?: No  Comments: lives alone Information entered by :: B.Sameen Leas,LPN   Activities of Daily Living     12/10/2023    1:49 PM  In your present state of health, do you have any difficulty performing the following activities:  Hearing? 0  Vision? 0  Difficulty concentrating or making decisions? 0  Walking or climbing stairs? 0  Dressing or bathing? 0  Doing errands, shopping? 0  Preparing Food and eating ? N  Using the Toilet? N  In the past six months, have you accidently leaked urine? N  Do you have problems with  loss of bowel control? N  Managing your Medications? N  Managing your Finances? N  Housekeeping or managing your Housekeeping? N    Patient Care Team: [name], MD as PCP - General (Family Medicine) Delford Maude BROCKS, MD as PCP - Cardiology (Cardiology) Cleatus Arlyss RAMAN, MD (Family Medicine) [place] Elspeth ORN, MD as Referring Physician (Optometry) Kay Kemps, MD as Consulting Physician (Orthopedic Surgery) Burnetta Aures, MD as Consulting Physician (Orthopedic Surgery) Cam Morene ORN, MD as Consulting Physician (Urology) Patrcia Cough, MD as Consulting Physician (Radiation Oncology) Grayce Buddle, RN Nurse Navigator as Registered Nurse (Medical Oncology) Tobie Franky SQUIBB, DPM as Consulting Physician (Podiatry)  I have updated your Care Teams any recent Medical Services you may have received from other providers in the past year.     Assessment:   This is a routine wellness examination for [name].  Hearing/Vision screen Hearing Screening - Comments:: Pt says his hearing is good Vision Screening - Comments:: Pt says his vision is good;readers only   Goals Addressed             This Visit's Progress    COMPLETED: Increase water intake       Starting 10/16/2018, I will continue to drink at least 6-8 glasses of water daily.      Monitor and Manage My Blood Sugar-Diabetes Type 2   Not on track    Timeframe:  Long-Range Goal Priority:  High Start Date:              06/07/20               Expected End Date:        06/07/21  Follow Up Date - 1 month 07/05/20   - check blood sugar at prescribed times - check blood sugar if I feel it is too high or too low - take the blood sugar log to all doctor visits    Why is this important?   Checking your blood sugar at home helps to keep it from getting very high or very low.  Writing the results in a diary or log helps the doctor know how to care for you.  Your blood sugar log should have the time, date and the results.   Also, write down the amount of insulin  or other medicine that you take.  Other information, like what you ate, exercise done and how you were feeling, will also be helpful.         Patient Stated   On track    12/10/23- I will maintain and continue medications as prescribed.     Patient Stated   On track    12/09/23-Remain active and healthy and work on decreasing pain level        Depression Screen     12/10/2023  1:45 PM 11/13/2023    9:07 AM 07/17/2023   10:06 AM 01/04/2023    8:41 AM 12/04/2022   10:20 AM 10/17/2021   11:47 AM 11/02/2020   10:03 AM  PHQ 2/9 Scores  PHQ - 2 Score 0 0 0 0 0 0 0  PHQ- 9 Score  0  6  0 0    Fall Risk     12/10/2023    1:42 PM 07/17/2023   10:06 AM 01/04/2023    8:41 AM 12/04/2022   10:19 AM 10/17/2021   11:03 AM  Fall Risk   Falls in the past year? 0 0 0 0 0  Number falls in past yr: 0   0   Injury with Fall? 0   0   Risk for fall due to : No Fall Risks   No Fall Risks   Follow up Education provided;Falls prevention discussed   Falls prevention discussed     MEDICARE RISK AT HOME: Medicare Risk at Home Any stairs in or around the home?: No If so, are there any without handrails?: Yes Home free of loose throw rugs in walkways, pet beds, electrical cords, etc?: Yes Adequate lighting in your home to reduce risk of falls?: Yes Life alert?: No Use of a cane, walker or w/c?: No Grab bars in the bathroom?: No Shower chair or bench in shower?: No Elevated toilet seat or a handicapped toilet?: Yes  TIMED UP AND GO:  Was the test performed?  No  Cognitive Function: 6CIT completed    10/29/2019    9:23 AM 10/16/2018    2:09 PM 03/04/2017    8:50 AM 02/02/2016    8:55 AM  MMSE - Mini Mental State Exam  Not completed: Refused     Orientation to time  5 5  5    Orientation to Place  5 5  5    Registration  3 3  3    Attention/ Calculation  0 0  0   Recall  3 3  3    Language- name 2 objects  0 0  0   Language- repeat  1 1 1   Language-  follow 3 step command  0 3  3   Language- read & follow direction  0 0  0   Write a sentence  0 0  0   Copy design  0 0  0   Total score  17 20  20       Data saved with a previous flowsheet row definition        12/10/2023    1:52 PM 12/04/2022   10:20 AM  6CIT Screen  What Year? 0 points 0 points  What month? 0 points 0 points  What time? 0 points 0 points  Count back from 20 0 points 0 points  Months in reverse 4 points 0 points  Repeat phrase 2 points 0 points  Total Score 6 points 0 points    Immunizations Immunization History  Administered Date(s) Administered   Fluad Quad(high Dose 65+) 02/03/2019, 03/09/2020, 04/18/2021, 04/18/2022   Fluad Trivalent(High Dose 65+) 01/04/2023   Influenza Split 02/06/2011, 01/08/2012   Influenza Whole 03/19/2007, 02/19/2008, 01/27/2009, 01/25/2010   Influenza, High Dose Seasonal PF 03/05/2018   Influenza,inj,Quad PF,6+ Mos 02/20/2013, 01/26/2014, 01/31/2015, 02/02/2016, 03/04/2017   Influenza,inj,quad, With Preservative 01/22/2019   PFIZER(Purple Top)SARS-COV-2 Vaccination 08/22/2019, 09/12/2019   Pneumococcal Conjugate-13 08/01/2015   Pneumococcal Polysaccharide-23 06/16/2012, 04/30/2019   Td 04/24/2003   Tdap 01/26/2014   Zoster, Live 12/15/2010  Screening Tests Health Maintenance  Topic Date Due   Zoster Vaccines- Shingrix (1 of 2) 02/14/1969   COVID-19 Vaccine (3 - Pfizer risk series) 10/10/2019   INFLUENZA VACCINE  11/22/2023   FOOT EXAM  01/04/2024   DTaP/Tdap/Td (3 - Td or Tdap) 01/27/2024   HEMOGLOBIN A1C  05/15/2024   Diabetic kidney evaluation - eGFR measurement  06/15/2024   OPHTHALMOLOGY EXAM  08/18/2024   Diabetic kidney evaluation - Urine ACR  11/12/2024   Medicare Annual Wellness (AWV)  12/09/2024   Colonoscopy  12/29/2028   Pneumococcal Vaccine: 50+ Years  Completed   Hepatitis C Screening  Completed   HPV VACCINES  Aged Out   Meningococcal B Vaccine  Aged Out   Pneumococcal Vaccine  Discontinued     Health Maintenance  Health Maintenance Due  Topic Date Due   Zoster Vaccines- Shingrix (1 of 2) 02/14/1969   COVID-19 Vaccine (3 - Pfizer risk series) 10/10/2019   INFLUENZA VACCINE  11/22/2023   Health Maintenance Items Addressed: None at this time  Additional Screening:  Vision Screening: Recommended annual ophthalmology exams for early detection of glaucoma and other disorders of the eye. Would you like a referral to an eye doctor? No    Dental Screening: Recommended annual dental exams for proper oral hygiene  Community Resource Referral / Chronic Care Management: CRR required this visit?  No   CCM required this visit?  No   Plan:    I have personally reviewed and noted the following in the patient's chart:   Medical and social history Use of alcohol, tobacco or illicit drugs  Current medications and supplements including opioid prescriptions. Patient is currently taking opioid prescriptions. Information provided to patient regarding non-opioid alternatives. Patient advised to discuss non-opioid treatment plan with their provider. Functional ability and status Nutritional status Physical activity Advanced directives List of other physicians Hospitalizations, surgeries, and ER visits in previous 12 months Vitals Screenings to include cognitive, depression, and falls Referrals and appointments  In addition, I have reviewed and discussed with patient certain preventive protocols, quality metrics, and best practice recommendations. A written personalized care plan for preventive services as well as general preventive health recommendations were provided to patient.   Robert LITTIE Saris, LPN   1/80/7974   After Visit Summary: (Declined) Due to this being a telephonic visit, with patients personalized plan was offered to patient but patient Declined AVS at this time   Notes: Nothing significant to report at this time.

## 2023-12-12 ENCOUNTER — Other Ambulatory Visit: Payer: Self-pay | Admitting: Family Medicine

## 2023-12-12 ENCOUNTER — Other Ambulatory Visit: Payer: Self-pay | Admitting: Cardiovascular Disease

## 2023-12-12 DIAGNOSIS — G8929 Other chronic pain: Secondary | ICD-10-CM

## 2023-12-12 DIAGNOSIS — E1169 Type 2 diabetes mellitus with other specified complication: Secondary | ICD-10-CM

## 2023-12-12 DIAGNOSIS — I10 Essential (primary) hypertension: Secondary | ICD-10-CM

## 2023-12-13 ENCOUNTER — Other Ambulatory Visit: Payer: Self-pay

## 2023-12-13 DIAGNOSIS — J309 Allergic rhinitis, unspecified: Secondary | ICD-10-CM

## 2023-12-13 MED ORDER — FLUTICASONE PROPIONATE 50 MCG/ACT NA SUSP: NASAL | 2 refills | Status: AC

## 2023-12-16 ENCOUNTER — Other Ambulatory Visit: Payer: Self-pay | Admitting: Family Medicine

## 2023-12-16 DIAGNOSIS — K219 Gastro-esophageal reflux disease without esophagitis: Secondary | ICD-10-CM

## 2023-12-16 DIAGNOSIS — E1169 Type 2 diabetes mellitus with other specified complication: Secondary | ICD-10-CM

## 2023-12-31 ENCOUNTER — Other Ambulatory Visit: Payer: Self-pay | Admitting: Family Medicine

## 2023-12-31 DIAGNOSIS — G8929 Other chronic pain: Secondary | ICD-10-CM

## 2024-01-10 ENCOUNTER — Telehealth: Payer: Self-pay

## 2024-01-10 NOTE — Telephone Encounter (Addendum)
 He's not on insulin . He's on once weekly ozempic . Ok to take ozempic  and metformin  together.

## 2024-01-10 NOTE — Telephone Encounter (Signed)
 Copied from CRM [id number]. Topic: Clinical - Medication Question >> Jan 10, 2024  1:29 PM Viola F wrote: Reason for CRM: Patient would like a call back to discuss the insulin  and metformin  medication - wants to know if he should be taking these at the same time, Please call him at [phone] (H)

## 2024-01-10 NOTE — Telephone Encounter (Signed)
 Reviewed with patient he is aware that the he will take injection once a week and continue the metformin . He did run one pin shout due to error when taking. Will call pharmacy and see about getting refill. He will reach out to our office if any further questions.

## 2024-01-13 ENCOUNTER — Other Ambulatory Visit: Payer: Self-pay

## 2024-01-13 DIAGNOSIS — E1169 Type 2 diabetes mellitus with other specified complication: Secondary | ICD-10-CM

## 2024-01-13 MED ORDER — OZEMPIC (0.25 OR 0.5 MG/DOSE) 2 MG/1.5ML ~~LOC~~ SOPN: 0.5000 mg | PEN_INJECTOR | SUBCUTANEOUS | 3 refills | Status: DC

## 2024-01-14 ENCOUNTER — Other Ambulatory Visit: Payer: Self-pay | Admitting: Family Medicine

## 2024-01-15 ENCOUNTER — Telehealth: Payer: Self-pay

## 2024-01-15 NOTE — Telephone Encounter (Signed)
 Copied from CRM #[id number]. Topic: Clinical - Prescription Issue >> Jan 15, 2024  2:39 PM Timindy P wrote: Reason for CRM: Pt is calling after receiving notice that his Copay for Ozempic  is $130 and he cannot afford this. He is asking for a call back to discuss this further. Pt can be reached at [phone]

## 2024-01-16 NOTE — Telephone Encounter (Unsigned)
 Copied from CRM #[id number]. Topic: Clinical - Medication Question >> Jan 16, 2024 10:04 AM Burnard DEL wrote: Reason for CRM: Patient called to check on the status of his Gabapentin  refill ,that is [facility] . Request was sent to provider on 01/14/2024.

## 2024-01-16 NOTE — Telephone Encounter (Signed)
 ERx

## 2024-01-17 NOTE — Telephone Encounter (Signed)
 Refill has been sent in per chart review.

## 2024-02-04 NOTE — Progress Notes (Deleted)
 CARDIOLOGY CONSULT NOTE       Patient ID: [name] MRN: [id number] DOB/AGE: 74-Nov-1952 74 y.o.  Admit date: (Not on file) Referring Physician: [name] Primary Physician: [name], MD Primary Cardiologist: [name] before Reason for Consultation: Aortic Aneurysm    HPI:  73 y.o. re established care 07/26/20  History of HTN, DM-2, HLD, GERD, Prostate cancer and remote history of substance abuse. Had normal cath in 2013 And again in May 2020. [name] note indicates severe right subclavian tortuosity and not to use right radial for cath again EF normal 55-60% TTE done 04/12/20 showed aortic root 4.3 cm with EF 55-60% tricuspid AV with mild AR  CT done 07/26/20 showed no aortic aneurysm   He is long time divorced. Retired from cooking Has a daughter in town Works out and bakes   No cardiac complaints  Likes his [name] and watches Regular church goer   Last visit BP was elevated on losartan  and coreg  doses increased to coreg  6.25 mg bid and Cozaar  100 mg daily  ***  ROS All other systems reviewed and negative except as noted above  Past Medical History:  Diagnosis Date  . Acquired dilation of ascending aorta and aortic root    43mm ascending aorta  . Allergic rhinitis   . Anxiety   . Atherosclerosis of aorta   . CAD (coronary artery disease) cardiologist-- dr wilbert turner   long hx atypical chest pain--- cardiac cath 08-24-2002 in epic showed normal coronaries;   03-17-2007  nuclear stress test showed ?small infarct , ef 60%;  cath done 12-31-2007 for recurrent CP showed nonob cad;   08-27-2018 stress test showed intermediate risk w/ apex ischemia, nuclear ef 61%;   cath done 08-29-2018 showed Nonobstructive 25% plaque in LAD   . Chronic neck pain    narcotic dependant  . DDD (degenerative disc disease), cervical   . Depression   . Diabetic neuropathy (HCC)   . Diverticulosis 2013   by CT scan  . Erectile dysfunction 08/01/2015  . Essential  hypertension 01/01/2008  . Fatty liver 07/31/2018   By US  07/2018,  followed by pcp  . GERD (gastroesophageal reflux disease)   . Hemorrhoids   . History of anemia   . History of cocaine abuse (HCC)    per pt quit 05/ 2004  . History of cocaine use 2004  . History of colon polyps   . History of epididymitis   . History of esophageal stricture    s/p  dilatation 2009;  12-30-2018  . History of gastric ulcer    12-30-2018  EGD showed non-bleeding gastric ulcer  . History of prostatitis 08/2011  . History of transient ischemic attack (TIA)    remote hx , related to cocaine abuse which pt quit 2005  . Hyperlipidemia   . Hypertension    Essential  . Lipoma of neck 10/01/2014   S/p excision    . OSA (obstructive sleep apnea)    study 05-07-2008 in epic , mild ,  pt does not use cpap  . Pain due to onychomycosis of toenails of both feet 10/15/2018  . Prostate cancer [facility]) urologist--- dr herrick/  oncologist--- dr patrcia   first dx 01/ 2018 w/ Stage T2a and Gleason 3+3 , active survillance;  repear bx 04-21-2019 Gleason 3+4,  scheduled for brachytherapy 07-03-2019  . T2DM (type 2 diabetes mellitus) (HCC)    followed by pcp---  (03-04-201  checks blood sugar 3 times daily,  fasting sugar's--  140-150    Family History  Problem Relation Age of Onset  . Diabetes Mother   . Hypertension Mother   . Hyperlipidemia Mother   . Diabetes Father   . Diabetes Sister   . Hypertension Sister   . Hypertension Brother   . Diabetes Brother   . Diabetes Brother   . Hypertension Brother   . Diabetes Brother   . Heart disease Maternal Uncle        Heart failure  . Cancer Paternal Uncle        Prostate  . Cancer Paternal Grandfather        Prostate  . Stroke Neg Hx   . Esophageal cancer Neg Hx   . Rectal cancer Neg Hx   . Stomach cancer Neg Hx   . Breast cancer Neg Hx   . Colon cancer Neg Hx     Social History   Socioeconomic History  . Marital status: Divorced    Spouse name: Not on  file  . Number of children: 1  . Years of education: Not on file  . Highest education level: Not on file  Occupational History  . Occupation: [name] previously    Associate Professor: UNEMPLOYED    Comment: Now on disability after neck surgery  Tobacco Use  . Smoking status: Never  . Smokeless tobacco: Never  Vaping Use  . Vaping status: Never Used  Substance and Sexual Activity  . Alcohol use: No    Alcohol/week: 0.0 standard drinks of alcohol  . Drug use: Not Currently    Comment: H/O marijuana (last 2005); Cocaine 5/04  . Sexual activity: Not on file  Other Topics Concern  . 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   Activity: no regular exercise, wants to start walking   Diet: good water, fruits/vegetables daily.      Retired   1 daughter and 1 granddaughter   Social Drivers of Corporate investment banker Strain: Low Risk  (12/10/2023)   Overall Financial Resource Strain (CARDIA)   . Difficulty of Paying Living Expenses: Not hard at all  Food Insecurity: No Food Insecurity (12/10/2023)   Hunger Vital Sign   . Worried About Programme researcher, broadcasting/film/video in the Last Year: Never true   . Ran Out of Food in the Last Year: Never true  Transportation Needs: No Transportation Needs (12/10/2023)   PRAPARE - Transportation   . Lack of Transportation (Medical): No   . Lack of Transportation (Non-Medical): No  Physical Activity: Inactive (12/10/2023)   Exercise Vital Sign   . Days of Exercise per Week: 0 days   . Minutes of Exercise per Session: 0 min  Stress: No Stress Concern Present (12/10/2023)   [employer] of Occupational Health - Occupational Stress Questionnaire   . Feeling of Stress: Not at all  Social Connections: Socially Isolated  (12/10/2023)   Social Connection and Isolation Panel   . Frequency of Communication with Friends and Family: More than three times a week   . Frequency of Social Gatherings with Friends and Family: More than three times a week   . Attends Religious Services: Never   . Active Member of Clubs or Organizations: No   . Attends Banker Meetings:  Never   . Marital Status: Divorced  Intimate Partner Violence: Not At Risk (12/10/2023)   Humiliation, Afraid, Rape, and Kick questionnaire   . Fear of Current or Ex-Partner: No   . Emotionally Abused: No   . Physically Abused: No   . Sexually Abused: No    Past Surgical History:  Procedure Laterality Date  . ANTERIOR CERVICAL DECOMP/DISCECTOMY FUSION  06-24-2008  and 10-28-2008 @[facility]    06-25-2008  C7 -- T1;    10-28-2008  C5 -- C7  . CARDIAC CATHETERIZATION  08-24-2002   dr gamble  @[facility]    normal coronaries  . CARDIAC CATHETERIZATION  12-31-2007    dr delford   nonobstructive CAD  . CARPAL TUNNEL RELEASE Right 2000  approx.  SABRA CATARACT EXTRACTION W/ INTRAOCULAR LENS  IMPLANT, BILATERAL  2016  . COLONOSCOPY  10/11/2011   severe diverticulosis, int hemorrhoids, rec rpt 5 yrs Ollen)  . COLONOSCOPY  12/2018   benign rectal polyp Ollen)  . DIRECT LARYNGOSCOPY  11/02/2008   @[facility]    Extubation under anesthesia post op complication from ACDF  . ESOPHAGOGASTRODUODENOSCOPY  10/11/2011   WNL Ollen)  . ESOPHAGOGASTRODUODENOSCOPY  09/11-2018   gastric ulcer, gastritis, dilated esophageal stenosis, limit NSAIDs continue PPI, no f/u planned Ollen)  . EXCISION MASS NECK N/A 08/29/2016   Procedure: EXCISION MASS POSTERIOR NECK;  Surgeon: Vanderbilt Ned, MD;  Location: [facility];  Service: General;  Laterality: N/A;  . HEMATOMA EVACUATION  10/29/2008   @[facility]    post op ACDF 10-28-2008  . LAPAROSCOPIC INGUINAL HERNIA REPAIR Bilateral 11-22-2006   @[facility]    AND EXCISION RLQ ABDOMINAL LIPOMA  . LEFT HEART CATH AND CORONARY ANGIOGRAPHY  N/A 08/29/2018   Procedure: LEFT HEART CATH AND CORONARY ANGIOGRAPHY;  Surgeon: Dann Candyce RAMAN, MD;  Location: [facility] INVASIVE CV LAB;  Service: Cardiovascular;  Laterality: N/A;  . RADIOACTIVE SEED IMPLANT N/A 07/03/2019   Procedure: RADIOACTIVE SEED IMPLANT/BRACHYTHERAPY IMPLANT;  Surgeon: Cam Morene ORN, MD;  Location: [facility];  Service: Urology;  Laterality: N/A;  . SHOULDER ARTHROSCOPY WITH SUBACROMIAL DECOMPRESSION AND OPEN ROTATOR C Left 01/23/2013   Procedure: LEFT SHOULDER ARTHROSCOPY WITH SUBACROMIAL DECOMPRESSION AND MINI OPEN ROTATOR CUFF REPAIR, AND OPEN DISTAL CLAVICLE RESECTION;  Surgeon: Elspeth JONELLE Her, MD;  Location: [facility] OR;  Service: Orthopedics;  Laterality: Left;  . SHOULDER SURGERY Right 05/2011  . SPACE OAR INSTILLATION N/A 07/03/2019   Procedure: SPACE OAR INSTILLATION;  Surgeon: Cam Morene ORN, MD;  Location[facility];  Service: Urology;  Laterality: N/A;  . TONSILLECTOMY  child  . TRIGGER FINGER RELEASE Right 12-12-2005   @[facility]    Right long finger      Current Outpatient Medications:  .  Accu-Chek Softclix Lancets lancets, Use as instructed, Disp: 100 each, Rfl: 3 .  ammonium lactate (AMLACTIN) 12 % cream, Apply topically in the morning and at bedtime., Disp: , Rfl:  .  aspirin  EC 81 MG tablet, Take 81 mg by mouth daily., Disp: , Rfl:  .  atorvastatin  (LIPITOR) 80 MG tablet, TAKE 1 TABLET BY MOUTH DAILY, Disp: 100 tablet, Rfl: 2 .  baclofen  (LIORESAL ) 10 MG tablet, TAKE 1 TABLET BY MOUTH AT  BEDTIME AS NEEDED FOR MUSCLE  SPASM, Disp: 100 tablet, Rfl: 1 .  benzonatate  (TESSALON ) 100 MG capsule, Take 1 capsule (100 mg total) by mouth every 8 (eight) hours. (Patient not taking: Reported on 12/10/2023), Disp: 21 capsule, Rfl: 0 .  Blood Glucose Monitoring Suppl (ACCU-CHEK GUIDE) w/Device KIT, Use  as instructed to check blood sugar once a day, Disp: 1 kit, Rfl: 0 .  carvedilol  (COREG ) 6.25 MG tablet, TAKE 1 TABLET BY MOUTH TWICE   DAILY WITH MEALS, Disp: 180 tablet, Rfl: 0 .  clobetasol cream (TEMOVATE) 0.05 %, , Disp: , Rfl:  .  empagliflozin  (JARDIANCE ) 25 MG TABS tablet, Take 1 tablet (25 mg total) by mouth daily., Disp: 90 tablet, Rfl: 2 .  fluticasone  (FLONASE ) 50 MCG/ACT nasal spray, USE 2 SPRAYS IN BOTH  NOSTRILS DAILY, Disp: 48 g, Rfl: 2 .  gabapentin  (NEURONTIN ) 100 MG capsule, TAKE 1 CAPSULE BY MOUTH TWICE  DAILY, Disp: 180 capsule, Rfl: 0 .  glucose blood (ACCU-CHEK GUIDE TEST) test strip, Use as instructed to check blood sugar once a day, Disp: 100 each, Rfl: 3 .  guaiFENesin (MUCINEX) 600 MG 12 hr tablet, Take 600 mg by mouth 2 (two) times daily as needed for cough., Disp: , Rfl:  .  losartan  (COZAAR ) 100 MG tablet, TAKE 1 TABLET BY MOUTH DAILY, Disp: 100 tablet, Rfl: 0 .  metFORMIN  (GLUCOPHAGE ) 1000 MG tablet, TAKE 1 TABLET BY MOUTH TWICE  DAILY WITH MEALS, Disp: 200 tablet, Rfl: 2 .  omeprazole  (PRILOSEC) 40 MG capsule, TAKE 1 CAPSULE BY MOUTH DAILY, Disp: 100 capsule, Rfl: 2 .  oseltamivir  (TAMIFLU ) 75 MG capsule, Take 1 capsule (75 mg total) by mouth 2 (two) times daily., Disp: 10 capsule, Rfl: 0 .  oxyCODONE -acetaminophen  (PERCOCET) 10-325 MG tablet, Take 1 tablet by mouth every 4 (four) hours as needed for pain., Disp: , Rfl:  .  polyvinyl alcohol (LIQUIFILM TEARS) 1.4 % ophthalmic solution, Place 1 drop into both eyes as needed for dry eyes., Disp: , Rfl:  .  Semaglutide ,0.25 or 0.5MG /DOS, (OZEMPIC , 0.25 OR 0.5 MG/DOSE,) 2 MG/1.5ML SOPN, Inject 0.5 mg into the skin once a week., Disp: 1.5 mL, Rfl: 3 .  Semaglutide ,0.25 or 0.5MG /DOS, (OZEMPIC , 0.25 OR 0.5 MG/DOSE,) 2 MG/3ML SOPN, Inject 0.25 mg into the skin once a week., Disp: 3 mL, Rfl: 0 .  sildenafil  (VIAGRA ) 100 MG tablet, Take 0.5-1 tablets (50-100 mg total) by mouth as needed for erectile dysfunction., Disp: 10 tablet, Rfl: 1 .  tamsulosin  (FLOMAX ) 0.4 MG CAPS capsule, Take 0.4 mg by mouth daily., Disp: , Rfl:   Current Facility-Administered  Medications:  .  triamcinolone  (KENALOG ) 0.1 % cream, , Topical, BID, Regal, Norman S, DPM . triamcinolone  cream   Topical BID     Physical Exam: There were no vitals taken for this visit.    Affect appropriate Healthy:  appears stated age HEENT: normal Neck supple with no adenopathy JVP normal no bruits no thyromegaly Lungs clear with no wheezing and good diaphragmatic motion Heart:  S1/S2 no murmur, no rub, gallop or click PMI normal Abdomen: benighn, BS positve, no tenderness, no AAA no bruit.  No HSM or HJR Distal pulses intact with no bruits No edema Neuro non-focal Skin warm and dry No muscular weakness   Labs:   Lab Results  Component Value Date   WBC 6.3 06/16/2023   HGB 13.7 06/16/2023   HCT 41.3 06/16/2023   MCV 91.6 06/16/2023   PLT 205 06/16/2023   No results for input(s): NA, K, CL, CO2, BUN, CREATININE, CALCIUM , PROT, BILITOT, ALKPHOS, ALT, AST, GLUCOSE in the last 168 hours.  Invalid input(s): LABALBU Lab Results  Component Value Date   CKTOTAL 4,038 (H) 11/03/2008   CKMB 2.4 11/02/2007   TROPONINI <0.03 08/26/2018    Lab Results  Component  Value Date   CHOL 121 03/29/2023   CHOL 108 10/17/2021   CHOL 106 10/26/2020   Lab Results  Component Value Date   HDL 31.70 (L) 03/29/2023   HDL 41.30 10/17/2021   HDL 34.20 (L) 10/26/2020   Lab Results  Component Value Date   LDLCALC 72 03/29/2023   LDLCALC 52 10/17/2021   LDLCALC 59 10/26/2020   Lab Results  Component Value Date   TRIG 85.0 03/29/2023   TRIG 75.0 10/17/2021   TRIG 62.0 10/26/2020   Lab Results  Component Value Date   CHOLHDL 4 03/29/2023   CHOLHDL 3 10/17/2021   CHOLHDL 3 10/26/2020   Lab Results  Component Value Date   LDLDIRECT 104 (H) 12/26/2012   LDLDIRECT 162.4 03/02/2010      Radiology: No results found.  EKG: SR rate 65 low voltage 03/08/20 02/04/2024 SR rate 82 PVC low voltage    ASSESSMENT AND PLAN:   1. Chest Pain: non  cardiac normal cath 08/29/2018 observe 2. Aneurysm:  Dilated root by echo but normal on CTA 07/25/20 reviewed 3.  HTN:  *** 4. DM:  Discussed low carb diet.  Target hemoglobin A1c is 6.5 or less.  Continue current medications. A1c 11/13/23 improved 7.6-> 6.9  5. HLD  On statin labs LDL 72 03/29/23   Increase coreg  to 6.25 bid and cozaar  100 mg daily Has f/u with primary soon   F/u in a year    Signed: Maude Emmer 02/04/2024, 4:12 PM

## 2024-02-11 DIAGNOSIS — H0102B Squamous blepharitis left eye, upper and lower eyelids: Secondary | ICD-10-CM | POA: Diagnosis not present

## 2024-02-11 DIAGNOSIS — H1045 Other chronic allergic conjunctivitis: Secondary | ICD-10-CM | POA: Diagnosis not present

## 2024-02-11 DIAGNOSIS — H0102A Squamous blepharitis right eye, upper and lower eyelids: Secondary | ICD-10-CM | POA: Diagnosis not present

## 2024-02-11 DIAGNOSIS — H40013 Open angle with borderline findings, low risk, bilateral: Secondary | ICD-10-CM | POA: Diagnosis not present

## 2024-02-13 ENCOUNTER — Ambulatory Visit: Admitting: Cardiovascular Disease

## 2024-02-24 ENCOUNTER — Other Ambulatory Visit: Payer: Self-pay | Admitting: Cardiovascular Disease

## 2024-03-04 ENCOUNTER — Telehealth: Payer: Self-pay

## 2024-03-04 NOTE — Telephone Encounter (Signed)
 Gave pt a call ,he is coming up due for re-enrollment on [facility] Jardiance , spoke with,pt wants pap to be mail out.faxed provider portion to office

## 2024-03-04 NOTE — Telephone Encounter (Signed)
 Pt call back ask to call [facility] he does not have Jardiance  over a week and want it to know when they will deliver his next refill, call [facility] spoke with a representative was able to submit a refill pt will received with in 7-10 days.call pt back let him know he will be receiving a shipment with in 7-10 days.

## 2024-03-06 NOTE — Telephone Encounter (Signed)
 Provider form signed and placed in my CMA box.

## 2024-03-10 NOTE — Telephone Encounter (Unsigned)
 Copied from CRM #[id number]. Topic: General - Other >> Mar 09, 2024  4:49 PM Viola F wrote: Reason for CRM: Patient called to follow up on form for Jardiance , please call him at with update - he only has 8 days left of the Jardiance  and wants to know if the form was mailed out yet. Please call him at  [phone] (H)

## 2024-03-17 ENCOUNTER — Telehealth: Payer: Self-pay

## 2024-03-17 NOTE — Telephone Encounter (Signed)
 Gave pt a call ,pt has not return pap (jardiance ) spoke with pt explain he just received in the mail,pt will filled ouy and mail back along proof of income.

## 2024-03-22 ENCOUNTER — Other Ambulatory Visit: Payer: Self-pay | Admitting: Family Medicine

## 2024-03-22 ENCOUNTER — Other Ambulatory Visit: Payer: Self-pay | Admitting: Cardiovascular Disease

## 2024-03-22 DIAGNOSIS — E1142 Type 2 diabetes mellitus with diabetic polyneuropathy: Secondary | ICD-10-CM

## 2024-03-22 DIAGNOSIS — I10 Essential (primary) hypertension: Secondary | ICD-10-CM

## 2024-03-23 ENCOUNTER — Telehealth: Payer: Self-pay | Admitting: Family Medicine

## 2024-03-23 NOTE — Telephone Encounter (Signed)
 ERx

## 2024-03-23 NOTE — Telephone Encounter (Signed)
 Patient arrived in office today to provider income documents along with forms had some questions as to if the documents were ok.  Placed in [place] box at front desk.

## 2024-03-23 NOTE — Telephone Encounter (Signed)
 Gabapentin  Last filled:  01/20/24, #180 Last OV:  11/13/23, 3 mo DM f/u Next OV:  04/06/24, annual exam

## 2024-03-27 NOTE — Telephone Encounter (Signed)
 Received pt portion pap bicares (jardiance ) along proof of income  waiting on provider portion

## 2024-04-06 ENCOUNTER — Encounter: Payer: Self-pay | Admitting: Family Medicine

## 2024-04-06 ENCOUNTER — Ambulatory Visit: Admitting: Family Medicine

## 2024-04-06 VITALS — BP 132/78 | HR 75 | Temp 98.6°F | Ht 67.0 in | Wt 236.4 lb

## 2024-04-06 DIAGNOSIS — K219 Gastro-esophageal reflux disease without esophagitis: Secondary | ICD-10-CM

## 2024-04-06 DIAGNOSIS — C61 Malignant neoplasm of prostate: Secondary | ICD-10-CM

## 2024-04-06 DIAGNOSIS — R809 Proteinuria, unspecified: Secondary | ICD-10-CM

## 2024-04-06 DIAGNOSIS — E1129 Type 2 diabetes mellitus with other diabetic kidney complication: Secondary | ICD-10-CM

## 2024-04-06 DIAGNOSIS — E1142 Type 2 diabetes mellitus with diabetic polyneuropathy: Secondary | ICD-10-CM | POA: Diagnosis not present

## 2024-04-06 DIAGNOSIS — I10 Essential (primary) hypertension: Secondary | ICD-10-CM | POA: Diagnosis not present

## 2024-04-06 DIAGNOSIS — Z0000 Encounter for general adult medical examination without abnormal findings: Secondary | ICD-10-CM | POA: Diagnosis not present

## 2024-04-06 DIAGNOSIS — E1169 Type 2 diabetes mellitus with other specified complication: Secondary | ICD-10-CM

## 2024-04-06 DIAGNOSIS — E785 Hyperlipidemia, unspecified: Secondary | ICD-10-CM

## 2024-04-06 DIAGNOSIS — G894 Chronic pain syndrome: Secondary | ICD-10-CM

## 2024-04-06 DIAGNOSIS — Z23 Encounter for immunization: Secondary | ICD-10-CM

## 2024-04-06 DIAGNOSIS — K760 Fatty (change of) liver, not elsewhere classified: Secondary | ICD-10-CM | POA: Diagnosis not present

## 2024-04-06 DIAGNOSIS — Z7189 Other specified counseling: Secondary | ICD-10-CM

## 2024-04-06 LAB — CBC WITH DIFFERENTIAL/PLATELET
Basophils Absolute: 0 K/uL (ref 0.0–0.1)
Basophils Relative: 0.7 % (ref 0.0–3.0)
Eosinophils Absolute: 0.1 K/uL (ref 0.0–0.7)
Eosinophils Relative: 1.4 % (ref 0.0–5.0)
HCT: 40.7 % (ref 39.0–52.0)
Hemoglobin: 13.6 g/dL (ref 13.0–17.0)
Lymphocytes Relative: 26.6 % (ref 12.0–46.0)
Lymphs Abs: 1.2 K/uL (ref 0.7–4.0)
MCHC: 33.4 g/dL (ref 30.0–36.0)
MCV: 93.8 fl (ref 78.0–100.0)
Monocytes Absolute: 0.6 K/uL (ref 0.1–1.0)
Monocytes Relative: 13.3 % — ABNORMAL HIGH (ref 3.0–12.0)
Neutro Abs: 2.5 K/uL (ref 1.4–7.7)
Neutrophils Relative %: 58 % (ref 43.0–77.0)
Platelets: 233 K/uL (ref 150.0–400.0)
RBC: 4.34 Mil/uL (ref 4.22–5.81)
RDW: 14.8 % (ref 11.5–15.5)
WBC: 4.3 K/uL (ref 4.0–10.5)

## 2024-04-06 LAB — COMPREHENSIVE METABOLIC PANEL WITH GFR
ALT: 22 U/L (ref 0–53)
AST: 13 U/L (ref 0–37)
Albumin: 4.3 g/dL (ref 3.5–5.2)
Alkaline Phosphatase: 47 U/L (ref 39–117)
BUN: 16 mg/dL (ref 6–23)
CO2: 29 meq/L (ref 19–32)
Calcium: 9.3 mg/dL (ref 8.4–10.5)
Chloride: 102 meq/L (ref 96–112)
Creatinine, Ser: 0.9 mg/dL (ref 0.40–1.50)
GFR: 84.35 mL/min (ref >60.00)
Glucose, Bld: 124 mg/dL — ABNORMAL HIGH (ref 70–99)
Potassium: 4.2 meq/L (ref 3.5–5.1)
Sodium: 139 meq/L (ref 135–145)
Total Bilirubin: 0.3 mg/dL (ref 0.2–1.2)
Total Protein: 6.9 g/dL (ref 6.0–8.3)

## 2024-04-06 LAB — LIPID PANEL
Cholesterol: 102 mg/dL (ref 0–200)
HDL: 34.3 mg/dL — ABNORMAL LOW (ref >39.00)
LDL Cholesterol: 54 mg/dL (ref 0–99)
NonHDL: 67.43
Total CHOL/HDL Ratio: 3
Triglycerides: 68 mg/dL (ref 0.0–149.0)
VLDL: 13.6 mg/dL (ref 0.0–40.0)

## 2024-04-06 LAB — HEMOGLOBIN A1C: Hgb A1c MFr Bld: 7 % — ABNORMAL HIGH (ref 4.6–6.5)

## 2024-04-06 LAB — MICROALBUMIN / CREATININE URINE RATIO
Creatinine,U: 53.3 mg/dL
Microalb Creat Ratio: 93.4 mg/g — ABNORMAL HIGH (ref 0.0–30.0)
Microalb, Ur: 5 mg/dL — ABNORMAL HIGH (ref 0.0–1.9)

## 2024-04-06 LAB — VITAMIN B12: Vitamin B-12: 729 pg/mL (ref 211–911)

## 2024-04-06 MED ORDER — METFORMIN HCL 1000 MG PO TABS: 1000.0000 mg | ORAL_TABLET | Freq: Two times a day (BID) | ORAL | 3 refills | Status: AC

## 2024-04-06 MED ORDER — OMEPRAZOLE 40 MG PO CPDR: 40.0000 mg | DELAYED_RELEASE_CAPSULE | Freq: Every day | ORAL | 3 refills | Status: AC

## 2024-04-06 MED ORDER — ATORVASTATIN CALCIUM 80 MG PO TABS: 80.0000 mg | ORAL_TABLET | Freq: Every day | ORAL | 3 refills | Status: AC

## 2024-04-06 MED ORDER — EMPAGLIFLOZIN 25 MG PO TABS: 25.0000 mg | ORAL_TABLET | Freq: Every day | ORAL | 3 refills | Status: AC

## 2024-04-06 NOTE — Assessment & Plan Note (Signed)
 Chronic, stable. Continue current regimen.

## 2024-04-06 NOTE — Assessment & Plan Note (Signed)
 Update labs including CMP, CBC

## 2024-04-06 NOTE — Assessment & Plan Note (Signed)
Continue ARB, SGLT2i.

## 2024-04-06 NOTE — Assessment & Plan Note (Signed)
 Followed by Dr Bonner PM&R on oxycodone 
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
## 2024-04-06 NOTE — Assessment & Plan Note (Signed)
 Previously discussed.

## 2024-04-06 NOTE — Assessment & Plan Note (Addendum)
 Chronic, stable on atorva 80mg  daily - continue. The ASCVD Risk score (Arnett DK, et al., 2019) failed to calculate for the following reasons:   The valid total cholesterol range is 130 to 320 mg/dL

## 2024-04-06 NOTE — Patient Instructions (Addendum)
 Flu shot today Labs today  If interested, check with pharmacy about new 2 shot shingles series (shingrix).  You could space out ozempic  dosing to every 10 days if too expensive. Depending on A1c results, we may increase ozempic  dose to 1mg  weekly  Return as needed or in 4 months for diabetes follow up visit

## 2024-04-06 NOTE — Assessment & Plan Note (Addendum)
 Chronic, stable.  Update A1c - he notes he would be interested in increasing ozempic  dose.  No symptoms of UTI or yeast /groin infections.  Diabetes associated with HTN, HLD, CAD, obesity.

## 2024-04-06 NOTE — Assessment & Plan Note (Signed)
 Preventative protocols reviewed and updated unless pt declined. Discussed healthy diet and lifestyle.

## 2024-04-06 NOTE — Assessment & Plan Note (Signed)
 Continue to encourage healthy diet and lifestyle choices to affect sustainable weight loss. Obesity complicated by diabetes, HLD, CAD, OA.

## 2024-04-06 NOTE — Assessment & Plan Note (Addendum)
 Regularly followed by urology with yearly PSA S/p brachy-therapy

## 2024-04-06 NOTE — Assessment & Plan Note (Signed)
Continue low dose gabapentin

## 2024-04-07 ENCOUNTER — Ambulatory Visit: Payer: Self-pay | Admitting: Family Medicine

## 2024-04-07 MED ORDER — SEMAGLUTIDE (1 MG/DOSE) 4 MG/3ML ~~LOC~~ SOPN: 1.0000 mg | PEN_INJECTOR | SUBCUTANEOUS | 3 refills | Status: AC

## 2024-04-07 NOTE — Telephone Encounter (Signed)
 Called patient reviewed all information and repeated back to me. Will call if any questions.  ? ?

## 2024-04-12 ENCOUNTER — Other Ambulatory Visit: Payer: Self-pay | Admitting: Family Medicine

## 2024-04-12 DIAGNOSIS — E1169 Type 2 diabetes mellitus with other specified complication: Secondary | ICD-10-CM

## 2024-04-13 NOTE — Telephone Encounter (Signed)
 Dose change.  Per 04/07/24 Results F/u notes, rx increased to 1 mg wkly. New rx sent 04/07/24, #9 mL/3 refills to [facility].   Request denied.

## 2024-04-20 ENCOUNTER — Other Ambulatory Visit: Payer: Self-pay | Admitting: Family Medicine

## 2024-04-20 DIAGNOSIS — E1142 Type 2 diabetes mellitus with diabetic polyneuropathy: Secondary | ICD-10-CM

## 2024-04-20 NOTE — Telephone Encounter (Unsigned)
 Copied from CRM #[id number]. Topic: Clinical - Medication Refill >> Apr 20, 2024  1:40 PM [name] wrote: Medication:  gabapentin  (NEURONTIN ) 100 MG capsule   Has the patient contacted their pharmacy? No (Agent: If no, request that the patient contact the pharmacy for the refill. If patient does not wish to contact the pharmacy document the reason why and proceed with request.) (Agent: If yes, when and what did the pharmacy advise?)  This is the patient's preferred pharmacy:  [facility] [phone] - [place], [place] - [address] AT [facility] [address] [place] [address] Phone: [phone] Fax: [phone]  Is this the correct pharmacy for this prescription? Yes If no, delete pharmacy and type the correct one.   Has the prescription been filled recently? No  Is the patient out of the medication? No  Has the patient been seen for an appointment in the last year OR does the patient have an upcoming appointment? Yes  Can we respond through MyChart? No  Agent: Please be advised that Rx refills may take up to 3 business days. We ask that you follow-up with your pharmacy.

## 2024-04-20 NOTE — Progress Notes (Signed)
 CARDIOLOGY CONSULT NOTE       Patient ID: [name] MRN: [id number] DOB/AGE: 07-20-1950 73 y.o.  Admit date: (Not on file) Referring Physician: [name] Primary Physician: Robert Baller, MD Primary Cardiologist: [name] before Reason for Consultation: Aortic Aneurysm    HPI:  73 y.o. re established care 07/26/20  History of HTN, DM-2, HLD, GERD, Prostate cancer and remote history of substance abuse. Had normal cath in 2013 And again in May 2020. [name] note indicates severe right subclavian tortuosity and not to use right radial for cath again EF normal 55-60% TTE done 04/12/20 showed aortic root 4.3 cm with EF 55-60% tricuspid AV with mild AR  CT done 07/26/20 showed no aortic aneurysm   He is long time divorced. Retired from cooking Has a daughter in town Works out and bakes   No cardiac complaints  Likes his [name] and watches Regular church goer   No cardiac symptoms BP elevated Discussed increasing coreg  and cozaar  with f/u primary He has a cuff at home and will start to monitor 3x/week His weight is up and baking/eating too many sweets  Having some left arm numbness Has had surgery with Robert Burnetta before but does not want to repeat   ROS All other systems reviewed and negative except as noted above  Past Medical History:  Diagnosis Date   Acquired dilation of ascending aorta and aortic root    43mm ascending aorta   Allergic rhinitis    Anxiety    Atherosclerosis of aorta    CAD (coronary artery disease) cardiologist-- Robert wilbert turner   long hx atypical chest pain--- cardiac cath 08-24-2002 in epic showed normal coronaries;   03-17-2007  nuclear stress test showed ?small infarct , ef 60%;  cath done 12-31-2007 for recurrent CP showed nonob cad;   08-27-2018 stress test showed intermediate risk w/ apex ischemia, nuclear ef 61%;   cath done 08-29-2018 showed Nonobstructive 25% plaque in LAD    Chronic neck pain    narcotic dependant   DDD  (degenerative disc disease), cervical    Depression    Diabetic neuropathy (HCC)    Diverticulosis 2013   by CT scan   Erectile dysfunction 08/01/2015   Essential hypertension 01/01/2008   Fatty liver 07/31/2018   By US  07/2018,  followed by pcp   GERD (gastroesophageal reflux disease)    Hemorrhoids    History of anemia    History of cocaine abuse (HCC)    per pt quit 05/ 2004   History of cocaine use 2004   History of colon polyps    History of epididymitis    History of esophageal stricture    s/p  dilatation 2009;  12-30-2018   History of gastric ulcer    12-30-2018  EGD showed non-bleeding gastric ulcer   History of prostatitis 08/2011   History of transient ischemic attack (TIA)    remote hx , related to cocaine abuse which pt quit 2005   Hyperlipidemia    Hypertension    Essential   Lipoma of neck 10/01/2014   S/p excision     OSA (obstructive sleep apnea)    study 05-07-2008 in epic , mild ,  pt does not use cpap   Pain due to onychomycosis of toenails of both feet 10/15/2018   Prostate cancer [facility]) urologist--- Robert herrick/  oncologist--- Robert patrcia   first dx 01/ 2018 w/ Stage T2a and Gleason 3+3 , active survillance;  repear bx 04-21-2019 Gleason 3+4,  scheduled for brachytherapy 07-03-2019   T2DM (type 2 diabetes mellitus) (HCC)    followed by pcp---  (03-04-201  checks blood sugar 3 times daily,  fasting sugar's-- 140-150    Family History  Problem Relation Age of Onset   Diabetes Mother    Hypertension Mother    Hyperlipidemia Mother    Diabetes Father    Diabetes Sister    Hypertension Sister    Hypertension Brother    Diabetes Brother    Diabetes Brother    Hypertension Brother    Diabetes Brother    Heart disease Maternal Uncle        Heart failure   Cancer Paternal Uncle        Prostate   Cancer Paternal Grandfather        Prostate   Stroke Neg Hx    Esophageal cancer Neg Hx    Rectal cancer Neg Hx    Stomach cancer Neg Hx    Breast cancer  Neg Hx    Colon cancer Neg Hx     Social History   Socioeconomic History   Marital status: Divorced    Spouse name: Not on file   Number of children: 1   Years of education: Not on file   Highest education level: Not on file  Occupational History   Occupation: [name] previously    Associate Professor: UNEMPLOYED    Comment: Now on disability after neck surgery  Tobacco Use   Smoking status: Never   Smokeless tobacco: Never  Vaping Use   Vaping status: Never Used  Substance and Sexual Activity   Alcohol use: No    Alcohol/week: 0.0 standard drinks of alcohol   Drug use: Not Currently    Comment: H/O marijuana (last 2005); Cocaine 5/04   Sexual activity: Not Currently  Other Topics Concern   Not on file  Social History Narrative   Lives alone   Education: completed 12th grade - did struggle with high school    Occupation: Has worked as estate manager/land agent in hospital, solicitor in a store, 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   Activity: no regular exercise, wants to start walking   Diet: good water, fruits/vegetables daily.      Retired   1 daughter and 1 granddaughter   Social Drivers of Health   Tobacco Use: Low Risk (04/06/2024)   Patient History    Smoking Tobacco Use: Never    Smokeless Tobacco Use: Never    Passive Exposure: Not on file  Financial Resource Strain: Low Risk (12/10/2023)   Overall Financial Resource Strain (CARDIA)    Difficulty of Paying Living Expenses: Not hard at all  Food Insecurity: No Food Insecurity (12/10/2023)   Epic    Worried About Programme Researcher, Broadcasting/film/video in the Last Year: Never true    Ran Out of Food in the Last Year: Never true  Transportation Needs: No Transportation Needs (12/10/2023)   Epic    Lack of Transportation (Medical): No    Lack of Transportation (Non-Medical): No  Physical Activity: Inactive (12/10/2023)   Exercise Vital Sign    Days of Exercise per Week: 0 days    Minutes  of Exercise per Session: 0 min  Stress: No Stress Concern Present (12/10/2023)   [employer] of Occupational Health - Occupational Stress Questionnaire    Feeling of Stress: Not at all  Social Connections: Socially Isolated (12/10/2023)   Social Connection and Isolation Panel  Frequency of Communication with Friends and Family: More than three times a week    Frequency of Social Gatherings with Friends and Family: More than three times a week    Attends Religious Services: Never    Database Administrator or Organizations: No    Attends Banker Meetings: Never    Marital Status: Divorced  Catering Manager Violence: Not At Risk (12/10/2023)   Epic    Fear of Current or Ex-Partner: No    Emotionally Abused: No    Physically Abused: No    Sexually Abused: No  Depression (PHQ2-9): Low Risk (04/06/2024)   Depression (PHQ2-9)    PHQ-2 Score: 0  Alcohol Screen: Low Risk (12/10/2023)   Alcohol Screen    Last Alcohol Screening Score (AUDIT): 0  Housing: Unknown (12/10/2023)   Epic    Unable to Pay for Housing in the Last Year: No    Number of Times Moved in the Last Year: Not on file    Homeless in the Last Year: No  Utilities: Not At Risk (12/10/2023)   Epic    Threatened with loss of utilities: No  Health Literacy: Adequate Health Literacy (12/10/2023)   B1300 Health Literacy    Frequency of need for help with medical instructions: Never    Past Surgical History:  Procedure Laterality Date   ANTERIOR CERVICAL DECOMP/DISCECTOMY FUSION  06-24-2008  and 10-28-2008 @[facility]    06-25-2008  C7 -- T1;    10-28-2008  C5 -- C7   CARDIAC CATHETERIZATION  08-24-2002   Robert gamble  @[facility]    normal coronaries   CARDIAC CATHETERIZATION  12-31-2007    Robert delford   nonobstructive CAD   CARPAL TUNNEL RELEASE Right 2000  approx.   CATARACT EXTRACTION W/ INTRAOCULAR LENS  IMPLANT, BILATERAL  2016   COLONOSCOPY  10/11/2011   severe diverticulosis, int hemorrhoids, rec rpt 5 yrs Ollen)    COLONOSCOPY  12/2018   benign rectal polyp Ollen)   DIRECT LARYNGOSCOPY  11/02/2008   @[facility]    Extubation under anesthesia post op complication from ACDF   ESOPHAGOGASTRODUODENOSCOPY  10/11/2011   WNL Ollen)   ESOPHAGOGASTRODUODENOSCOPY  09/11-2018   gastric ulcer, gastritis, dilated esophageal stenosis, limit NSAIDs continue PPI, no f/u planned Ollen)   EXCISION MASS NECK N/A 08/29/2016   Procedure: EXCISION MASS POSTERIOR NECK;  Surgeon: Vanderbilt Ned, MD;  Location: [facility];  Service: General;  Laterality: N/A;   HEMATOMA EVACUATION  10/29/2008   @[facility]    post op ACDF 10-28-2008   LAPAROSCOPIC INGUINAL HERNIA REPAIR Bilateral 11-22-2006   @[facility]    AND EXCISION RLQ ABDOMINAL LIPOMA   LEFT HEART CATH AND CORONARY ANGIOGRAPHY N/A 08/29/2018   Procedure: LEFT HEART CATH AND CORONARY ANGIOGRAPHY;  Surgeon: [name] Candyce RAMAN, MD;  Location: [facility];  Service: Cardiovascular;  Laterality: N/A;   RADIOACTIVE SEED IMPLANT N/A 07/03/2019   Procedure: RADIOACTIVE SEED IMPLANT/BRACHYTHERAPY IMPLANT;  Surgeon: Cam Morene ORN, MD;  Location: [facility];  Service: Urology;  Laterality: N/A;   SHOULDER ARTHROSCOPY WITH SUBACROMIAL DECOMPRESSION AND OPEN ROTATOR C Left 01/23/2013   Procedure: LEFT SHOULDER ARTHROSCOPY WITH SUBACROMIAL DECOMPRESSION AND MINI OPEN ROTATOR CUFF REPAIR, AND OPEN DISTAL CLAVICLE RESECTION;  Surgeon: Elspeth JONELLE Her, MD;  Location: [facility];  Service: Orthopedics;  Laterality: Left;   SHOULDER SURGERY Right 05/2011   SPACE OAR INSTILLATION N/A 07/03/2019   Procedure: SPACE OAR INSTILLATION;  Surgeon: Cam Morene ORN, MD;  Location: [facility];  Service: Urology;  Laterality: N/A;   TONSILLECTOMY  child   TRIGGER FINGER RELEASE Right 12-12-2005   @[facility]    Right long finger      Current Outpatient Medications:    Accu-Chek Softclix Lancets lancets, Use as instructed, Disp: 100 each, Rfl: 3   aspirin  EC 81 MG  tablet, Take 81 mg by mouth daily., Disp: , Rfl:    atorvastatin  (LIPITOR) 80 MG tablet, Take 1 tablet (80 mg total) by mouth daily., Disp: 100 tablet, Rfl: 3   baclofen  (LIORESAL ) 10 MG tablet, TAKE 1 TABLET BY MOUTH AT  BEDTIME AS NEEDED FOR MUSCLE  SPASM, Disp: 100 tablet, Rfl: 1   benzonatate  (TESSALON ) 100 MG capsule, Take 1 capsule (100 mg total) by mouth every 8 (eight) hours. (Patient not taking: Reported on 04/06/2024), Disp: 21 capsule, Rfl: 0   Blood Glucose Monitoring Suppl (ACCU-CHEK GUIDE) w/Device KIT, Use as instructed to check blood sugar once a day, Disp: 1 kit, Rfl: 0   carvedilol  (COREG ) 6.25 MG tablet, TAKE 1 TABLET BY MOUTH TWICE  DAILY WITH MEALS, Disp: 60 tablet, Rfl: 0   clobetasol cream (TEMOVATE) 0.05 %, , Disp: , Rfl:    empagliflozin  (JARDIANCE ) 25 MG TABS tablet, Take 1 tablet (25 mg total) by mouth daily., Disp: 90 tablet, Rfl: 3   fluticasone  (FLONASE ) 50 MCG/ACT nasal spray, USE 2 SPRAYS IN BOTH  NOSTRILS DAILY, Disp: 48 g, Rfl: 2   gabapentin  (NEURONTIN ) 100 MG capsule, TAKE 1 CAPSULE BY MOUTH TWICE  DAILY, Disp: 180 capsule, Rfl: 3   glucose blood (ACCU-CHEK GUIDE TEST) test strip, Use as instructed to check blood sugar once a day, Disp: 100 each, Rfl: 3   guaiFENesin (MUCINEX) 600 MG 12 hr tablet, Take 600 mg by mouth 2 (two) times daily as needed for cough., Disp: , Rfl:    losartan  (COZAAR ) 100 MG tablet, TAKE 1 TABLET BY MOUTH DAILY, Disp: 100 tablet, Rfl: 0   metFORMIN  (GLUCOPHAGE ) 1000 MG tablet, Take 1 tablet (1,000 mg total) by mouth 2 (two) times daily with a meal., Disp: 200 tablet, Rfl: 3   omeprazole  (PRILOSEC) 40 MG capsule, Take 1 capsule (40 mg total) by mouth daily., Disp: 100 capsule, Rfl: 3   oxyCODONE -acetaminophen  (PERCOCET) 10-325 MG tablet, Take 1 tablet by mouth every 4 (four) hours as needed for pain., Disp: , Rfl:    polyvinyl alcohol (LIQUIFILM TEARS) 1.4 % ophthalmic solution, Place 1 drop into both eyes as needed for dry eyes. (Patient  not taking: Reported on 04/06/2024), Disp: , Rfl:    Semaglutide , 1 MG/DOSE, 4 MG/3ML SOPN, Inject 1 mg as directed once a week., Disp: 9 mL, Rfl: 3   sildenafil  (VIAGRA ) 100 MG tablet, Take 0.5-1 tablets (50-100 mg total) by mouth as needed for erectile dysfunction. (Patient not taking: Reported on 04/06/2024), Disp: 10 tablet, Rfl: 1   tamsulosin  (FLOMAX ) 0.4 MG CAPS capsule, Take 0.4 mg by mouth daily., Disp: , Rfl:   Current Facility-Administered Medications:    triamcinolone  (KENALOG ) 0.1 % cream, , Topical, BID, Regal, Norman S, DPM  triamcinolone  cream   Topical BID     Physical Exam: There were no vitals taken for this visit.    Affect appropriate Healthy:  appears stated age HEENT: normal prior anterior fusion Neck supple with no adenopathy JVP normal no bruits no thyromegaly Lungs clear with no wheezing and good diaphragmatic motion Heart:  S1/S2 no murmur, no rub, gallop or click PMI normal Abdomen: benighn, BS positve, no tenderness,  no AAA no bruit.  No HSM or HJR Distal pulses intact with no bruits No edema Neuro non-focal Skin warm and dry No muscular weakness   Labs:   Lab Results  Component Value Date   WBC 4.3 04/06/2024   HGB 13.6 04/06/2024   HCT 40.7 04/06/2024   MCV 93.8 04/06/2024   PLT 233.0 04/06/2024   No results for input(s): NA, K, CL, CO2, BUN, CREATININE, CALCIUM , PROT, BILITOT, ALKPHOS, ALT, AST, GLUCOSE in the last 168 hours.  Invalid input(s): LABALBU Lab Results  Component Value Date   CKTOTAL 4,038 (H) 11/03/2008   CKMB 2.4 11/02/2007   TROPONINI <0.03 08/26/2018    Lab Results  Component Value Date   CHOL 102 04/06/2024   CHOL 121 03/29/2023   CHOL 108 10/17/2021   Lab Results  Component Value Date   HDL 34.30 (L) 04/06/2024   HDL 31.70 (L) 03/29/2023   HDL 41.30 10/17/2021   Lab Results  Component Value Date   LDLCALC 54 04/06/2024   LDLCALC 72 03/29/2023   LDLCALC 52 10/17/2021   Lab  Results  Component Value Date   TRIG 68.0 04/06/2024   TRIG 85.0 03/29/2023   TRIG 75.0 10/17/2021   Lab Results  Component Value Date   CHOLHDL 3 04/06/2024   CHOLHDL 4 03/29/2023   CHOLHDL 3 10/17/2021   Lab Results  Component Value Date   LDLDIRECT 104 (H) 12/26/2012   LDLDIRECT 162.4 03/02/2010      Radiology: No results found.  EKG: SR rate 65 low voltage 03/08/20 04/20/2024 SR rate 82 PVC low voltage    ASSESSMENT AND PLAN:   1. Chest Pain: non cardiac normal cath 08/29/2018 observe 2. Aneurysm:  Dilated root by echo but normal on CTA 07/25/20 reviewed 3.  HTN:  ON cozaar  and coreg  Doses increased 07/26/22 Some issues with compliance  improved  4. DM:  Discussed low carb diet.  Target hemoglobin A1c is 6.5 or less.  Continue current medications. 5. HLD  On statin labs with primary  6. Numbness left Arm:  neuropathic prior anterior neck fusion f/u Robert Burnetta   F/u in a year    Signed: Maude Emmer 04/20/2024, 1:00 PM

## 2024-04-28 ENCOUNTER — Ambulatory Visit: Attending: Cardiovascular Disease | Admitting: Cardiovascular Disease

## 2024-04-28 VITALS — BP 128/68 | HR 67 | Ht 67.0 in | Wt 232.5 lb

## 2024-04-28 DIAGNOSIS — I10 Essential (primary) hypertension: Secondary | ICD-10-CM | POA: Diagnosis not present

## 2024-04-28 DIAGNOSIS — E7800 Pure hypercholesterolemia, unspecified: Secondary | ICD-10-CM

## 2024-04-28 DIAGNOSIS — I2510 Atherosclerotic heart disease of native coronary artery without angina pectoris: Secondary | ICD-10-CM | POA: Diagnosis not present

## 2024-04-28 NOTE — Progress Notes (Signed)
 [name]                                          MRN: [id number]   04/28/2024   The [facility] Quality Team Specialist reviewed this patient medical record for the purposes of chart review for care gap closure. The following were reviewed: abstraction for care gap closure-glycemic status assessment.    VBCI Quality Team

## 2024-04-28 NOTE — Patient Instructions (Signed)
 Medication Instructions:  No Changes *If you need a refill on your cardiac medications before your next appointment, please call your pharmacy*  Lab Work: None  Follow-Up: At [facility], you and your health needs are our priority.  As part of our continuing mission to provide you with exceptional heart care, our providers are all part of one team.  This team includes your primary Cardiologist (physician) and Advanced Practice Providers or APPs (Physician Assistants and Nurse Practitioners) who all work together to provide you with the care you need, when you need it.  Your next appointment:   1 year(s)  Provider:   Maude Emmer, MD    We recommend signing up for the patient portal called MyChart.  Sign up information is provided on this After Visit Summary.  MyChart is used to connect with patients for Virtual Visits (Telemedicine).  Patients are able to view lab/test results, encounter notes, upcoming appointments, etc.  Non-urgent messages can be sent to your provider as well.   To learn more about what you can do with MyChart, go to [email or web address].

## 2024-05-01 NOTE — Telephone Encounter (Unsigned)
 Copied from CRM #[id number]. Topic: Clinical - Prescription Issue >> Apr 30, 2024  2:01 PM Deleta RAMAN wrote: Reason for CRM: Will like to cancel refill request states he received the prescription from [facility]

## 2024-05-10 ENCOUNTER — Other Ambulatory Visit: Payer: Self-pay | Admitting: Family Medicine

## 2024-05-10 DIAGNOSIS — K760 Fatty (change of) liver, not elsewhere classified: Secondary | ICD-10-CM

## 2024-05-10 DIAGNOSIS — G8929 Other chronic pain: Secondary | ICD-10-CM

## 2024-05-10 DIAGNOSIS — E1169 Type 2 diabetes mellitus with other specified complication: Secondary | ICD-10-CM

## 2024-05-10 DIAGNOSIS — D509 Iron deficiency anemia, unspecified: Secondary | ICD-10-CM

## 2024-05-11 NOTE — Telephone Encounter (Signed)
 Baclofen  Last filled:  03/10/24, #100 Last OV:  04/06/24, annual exam Next OV:  08/05/24, 4 mo DM f/u

## 2024-05-12 ENCOUNTER — Other Ambulatory Visit

## 2024-05-18 NOTE — Telephone Encounter (Signed)
 Submitted pt and provider portion Bicares (Jardiance ) to company.

## 2024-05-19 ENCOUNTER — Encounter: Admitting: Family Medicine

## 2024-05-19 NOTE — Telephone Encounter (Signed)
 Received approval letter from [employer] ([employer]ardiance ) thru 12.31.2026,approval letter index

## 2024-08-05 ENCOUNTER — Ambulatory Visit: Admitting: Family Medicine

## 2024-12-10 ENCOUNTER — Ambulatory Visit

## 2024-12-11 ENCOUNTER — Ambulatory Visit
